# Patient Record
Sex: Female | Born: 1940 | Race: White | Hispanic: No | Marital: Married | Smoking: Former smoker
Health system: Southern US, Community
[De-identification: ages and names within clinical notes are randomized; demographics above are authoritative.]

## PROBLEM LIST (undated history)

## (undated) DIAGNOSIS — F419 Anxiety disorder, unspecified: Secondary | ICD-10-CM

## (undated) DIAGNOSIS — I10 Essential (primary) hypertension: Secondary | ICD-10-CM

## (undated) DIAGNOSIS — M719 Bursopathy, unspecified: Secondary | ICD-10-CM

## (undated) DIAGNOSIS — M1990 Unspecified osteoarthritis, unspecified site: Secondary | ICD-10-CM

## (undated) DIAGNOSIS — K429 Umbilical hernia without obstruction or gangrene: Secondary | ICD-10-CM

## (undated) DIAGNOSIS — K219 Gastro-esophageal reflux disease without esophagitis: Secondary | ICD-10-CM

## (undated) DIAGNOSIS — N200 Calculus of kidney: Secondary | ICD-10-CM

## (undated) DIAGNOSIS — T7840XA Allergy, unspecified, initial encounter: Secondary | ICD-10-CM

## (undated) DIAGNOSIS — R011 Cardiac murmur, unspecified: Secondary | ICD-10-CM

## (undated) DIAGNOSIS — Z87442 Personal history of urinary calculi: Secondary | ICD-10-CM

## (undated) HISTORY — PX: SMALL INTESTINE SURGERY: SHX150

## (undated) HISTORY — PX: TUBAL LIGATION: SHX77

## (undated) HISTORY — DX: Cardiac murmur, unspecified: R01.1

## (undated) HISTORY — DX: Gastro-esophageal reflux disease without esophagitis: K21.9

## (undated) HISTORY — DX: Anxiety disorder, unspecified: F41.9

## (undated) HISTORY — DX: Bursopathy, unspecified: M71.9

## (undated) HISTORY — DX: Umbilical hernia without obstruction or gangrene: K42.9

## (undated) HISTORY — PX: JOINT REPLACEMENT: SHX530

## (undated) HISTORY — DX: Unspecified osteoarthritis, unspecified site: M19.90

## (undated) HISTORY — DX: Allergy, unspecified, initial encounter: T78.40XA

## (undated) HISTORY — PX: SHOULDER ARTHROSCOPY WITH CAPSULORRHAPHY: SHX6454

---

## 2011-07-09 DIAGNOSIS — H40019 Open angle with borderline findings, low risk, unspecified eye: Secondary | ICD-10-CM | POA: Diagnosis not present

## 2011-07-09 DIAGNOSIS — H35369 Drusen (degenerative) of macula, unspecified eye: Secondary | ICD-10-CM | POA: Diagnosis not present

## 2011-07-09 DIAGNOSIS — H259 Unspecified age-related cataract: Secondary | ICD-10-CM | POA: Diagnosis not present

## 2011-07-21 DIAGNOSIS — M19079 Primary osteoarthritis, unspecified ankle and foot: Secondary | ICD-10-CM | POA: Diagnosis not present

## 2011-07-27 DIAGNOSIS — E785 Hyperlipidemia, unspecified: Secondary | ICD-10-CM | POA: Diagnosis not present

## 2011-07-27 DIAGNOSIS — Z79899 Other long term (current) drug therapy: Secondary | ICD-10-CM | POA: Diagnosis not present

## 2011-07-27 DIAGNOSIS — I359 Nonrheumatic aortic valve disorder, unspecified: Secondary | ICD-10-CM | POA: Diagnosis not present

## 2011-07-27 DIAGNOSIS — R072 Precordial pain: Secondary | ICD-10-CM | POA: Diagnosis not present

## 2011-07-27 DIAGNOSIS — N644 Mastodynia: Secondary | ICD-10-CM | POA: Diagnosis not present

## 2011-07-27 DIAGNOSIS — M25579 Pain in unspecified ankle and joints of unspecified foot: Secondary | ICD-10-CM | POA: Diagnosis not present

## 2011-07-27 DIAGNOSIS — I10 Essential (primary) hypertension: Secondary | ICD-10-CM | POA: Diagnosis not present

## 2011-07-29 DIAGNOSIS — R5383 Other fatigue: Secondary | ICD-10-CM | POA: Diagnosis not present

## 2011-07-29 DIAGNOSIS — Z79899 Other long term (current) drug therapy: Secondary | ICD-10-CM | POA: Diagnosis not present

## 2011-07-29 DIAGNOSIS — E785 Hyperlipidemia, unspecified: Secondary | ICD-10-CM | POA: Diagnosis not present

## 2011-07-29 DIAGNOSIS — D539 Nutritional anemia, unspecified: Secondary | ICD-10-CM | POA: Diagnosis not present

## 2011-07-29 DIAGNOSIS — R7989 Other specified abnormal findings of blood chemistry: Secondary | ICD-10-CM | POA: Diagnosis not present

## 2011-08-11 DIAGNOSIS — R072 Precordial pain: Secondary | ICD-10-CM | POA: Diagnosis not present

## 2011-08-11 DIAGNOSIS — R0989 Other specified symptoms and signs involving the circulatory and respiratory systems: Secondary | ICD-10-CM | POA: Diagnosis not present

## 2011-08-11 DIAGNOSIS — I10 Essential (primary) hypertension: Secondary | ICD-10-CM | POA: Diagnosis not present

## 2011-09-21 DIAGNOSIS — M19019 Primary osteoarthritis, unspecified shoulder: Secondary | ICD-10-CM | POA: Diagnosis not present

## 2011-09-21 DIAGNOSIS — M25519 Pain in unspecified shoulder: Secondary | ICD-10-CM | POA: Diagnosis not present

## 2011-09-21 DIAGNOSIS — M75120 Complete rotator cuff tear or rupture of unspecified shoulder, not specified as traumatic: Secondary | ICD-10-CM | POA: Diagnosis not present

## 2011-12-31 DIAGNOSIS — I10 Essential (primary) hypertension: Secondary | ICD-10-CM | POA: Diagnosis not present

## 2011-12-31 DIAGNOSIS — N951 Menopausal and female climacteric states: Secondary | ICD-10-CM | POA: Diagnosis not present

## 2012-01-14 DIAGNOSIS — I10 Essential (primary) hypertension: Secondary | ICD-10-CM | POA: Diagnosis not present

## 2012-02-09 ENCOUNTER — Other Ambulatory Visit (HOSPITAL_COMMUNITY): Payer: Self-pay | Admitting: Family

## 2012-02-09 DIAGNOSIS — Z1231 Encounter for screening mammogram for malignant neoplasm of breast: Secondary | ICD-10-CM

## 2012-02-18 ENCOUNTER — Ambulatory Visit (HOSPITAL_COMMUNITY)
Admission: RE | Admit: 2012-02-18 | Discharge: 2012-02-18 | Disposition: A | Discharge status: Home / Self Care | Payer: Medicare Other | Source: Ambulatory Visit | Attending: Family Medicine | Admitting: Family Medicine

## 2012-02-18 DIAGNOSIS — Z1231 Encounter for screening mammogram for malignant neoplasm of breast: Secondary | ICD-10-CM | POA: Diagnosis not present

## 2012-03-02 DIAGNOSIS — M25519 Pain in unspecified shoulder: Secondary | ICD-10-CM | POA: Diagnosis not present

## 2012-06-27 DIAGNOSIS — H2510 Age-related nuclear cataract, unspecified eye: Secondary | ICD-10-CM | POA: Diagnosis not present

## 2012-06-27 DIAGNOSIS — H18419 Arcus senilis, unspecified eye: Secondary | ICD-10-CM | POA: Diagnosis not present

## 2012-07-15 DIAGNOSIS — N951 Menopausal and female climacteric states: Secondary | ICD-10-CM | POA: Diagnosis not present

## 2012-07-15 DIAGNOSIS — I10 Essential (primary) hypertension: Secondary | ICD-10-CM | POA: Diagnosis not present

## 2012-07-27 DIAGNOSIS — H2510 Age-related nuclear cataract, unspecified eye: Secondary | ICD-10-CM | POA: Diagnosis not present

## 2012-07-27 DIAGNOSIS — H269 Unspecified cataract: Secondary | ICD-10-CM | POA: Diagnosis not present

## 2012-08-01 DIAGNOSIS — H269 Unspecified cataract: Secondary | ICD-10-CM | POA: Diagnosis not present

## 2012-08-01 DIAGNOSIS — H2510 Age-related nuclear cataract, unspecified eye: Secondary | ICD-10-CM | POA: Diagnosis not present

## 2012-08-12 DIAGNOSIS — N951 Menopausal and female climacteric states: Secondary | ICD-10-CM | POA: Diagnosis not present

## 2012-08-12 DIAGNOSIS — I10 Essential (primary) hypertension: Secondary | ICD-10-CM | POA: Diagnosis not present

## 2012-11-04 DIAGNOSIS — T148 Other injury of unspecified body region: Secondary | ICD-10-CM | POA: Diagnosis not present

## 2012-11-28 DIAGNOSIS — Z96619 Presence of unspecified artificial shoulder joint: Secondary | ICD-10-CM | POA: Diagnosis not present

## 2012-12-07 DIAGNOSIS — H26499 Other secondary cataract, unspecified eye: Secondary | ICD-10-CM | POA: Diagnosis not present

## 2012-12-07 DIAGNOSIS — Z961 Presence of intraocular lens: Secondary | ICD-10-CM | POA: Diagnosis not present

## 2013-02-06 ENCOUNTER — Other Ambulatory Visit (HOSPITAL_COMMUNITY): Payer: Self-pay | Admitting: Family

## 2013-02-06 DIAGNOSIS — Z1231 Encounter for screening mammogram for malignant neoplasm of breast: Secondary | ICD-10-CM

## 2013-02-10 DIAGNOSIS — I10 Essential (primary) hypertension: Secondary | ICD-10-CM | POA: Diagnosis not present

## 2013-02-10 DIAGNOSIS — M79609 Pain in unspecified limb: Secondary | ICD-10-CM | POA: Diagnosis not present

## 2013-02-10 DIAGNOSIS — L989 Disorder of the skin and subcutaneous tissue, unspecified: Secondary | ICD-10-CM | POA: Diagnosis not present

## 2013-02-10 DIAGNOSIS — R0789 Other chest pain: Secondary | ICD-10-CM | POA: Diagnosis not present

## 2013-02-16 DIAGNOSIS — M79609 Pain in unspecified limb: Secondary | ICD-10-CM | POA: Diagnosis not present

## 2013-02-16 DIAGNOSIS — M2010 Hallux valgus (acquired), unspecified foot: Secondary | ICD-10-CM | POA: Diagnosis not present

## 2013-02-16 DIAGNOSIS — Q6689 Other  specified congenital deformities of feet: Secondary | ICD-10-CM | POA: Diagnosis not present

## 2013-02-16 DIAGNOSIS — Q742 Other congenital malformations of lower limb(s), including pelvic girdle: Secondary | ICD-10-CM | POA: Diagnosis not present

## 2013-02-20 ENCOUNTER — Ambulatory Visit (HOSPITAL_COMMUNITY)
Admission: RE | Admit: 2013-02-20 | Discharge: 2013-02-20 | Disposition: A | Discharge status: Home / Self Care | Payer: Medicare Other | Source: Ambulatory Visit | Attending: Family Medicine | Admitting: Family Medicine

## 2013-02-20 DIAGNOSIS — Z1231 Encounter for screening mammogram for malignant neoplasm of breast: Secondary | ICD-10-CM | POA: Diagnosis not present

## 2013-02-23 DIAGNOSIS — L821 Other seborrheic keratosis: Secondary | ICD-10-CM | POA: Diagnosis not present

## 2013-02-23 DIAGNOSIS — L820 Inflamed seborrheic keratosis: Secondary | ICD-10-CM | POA: Diagnosis not present

## 2013-02-23 DIAGNOSIS — D485 Neoplasm of uncertain behavior of skin: Secondary | ICD-10-CM | POA: Diagnosis not present

## 2013-06-14 DIAGNOSIS — H26499 Other secondary cataract, unspecified eye: Secondary | ICD-10-CM | POA: Diagnosis not present

## 2013-07-09 ENCOUNTER — Encounter (HOSPITAL_COMMUNITY): Payer: Medicare Other | Admitting: Certified Registered Nurse Anesthetist

## 2013-07-09 ENCOUNTER — Encounter (HOSPITAL_COMMUNITY): Payer: Self-pay | Admitting: Emergency Medicine

## 2013-07-09 ENCOUNTER — Encounter (HOSPITAL_COMMUNITY): Admission: EM | Disposition: A | Discharge status: Home / Self Care | Payer: Self-pay | Source: Home / Self Care

## 2013-07-09 ENCOUNTER — Emergency Department (HOSPITAL_COMMUNITY): Payer: Medicare Other

## 2013-07-09 ENCOUNTER — Emergency Department (HOSPITAL_COMMUNITY): Payer: Medicare Other | Admitting: Certified Registered Nurse Anesthetist

## 2013-07-09 ENCOUNTER — Inpatient Hospital Stay (HOSPITAL_COMMUNITY)
Admission: EM | Admit: 2013-07-09 | Discharge: 2013-07-16 | DRG: 330 | Disposition: A | Discharge status: Home / Self Care | Payer: Medicare Other | Attending: Surgery | Admitting: Surgery

## 2013-07-09 DIAGNOSIS — D259 Leiomyoma of uterus, unspecified: Secondary | ICD-10-CM | POA: Diagnosis not present

## 2013-07-09 DIAGNOSIS — R142 Eructation: Secondary | ICD-10-CM | POA: Diagnosis not present

## 2013-07-09 DIAGNOSIS — J9819 Other pulmonary collapse: Secondary | ICD-10-CM | POA: Diagnosis not present

## 2013-07-09 DIAGNOSIS — J90 Pleural effusion, not elsewhere classified: Secondary | ICD-10-CM | POA: Diagnosis not present

## 2013-07-09 DIAGNOSIS — I10 Essential (primary) hypertension: Secondary | ICD-10-CM | POA: Diagnosis present

## 2013-07-09 DIAGNOSIS — N281 Cyst of kidney, acquired: Secondary | ICD-10-CM | POA: Diagnosis not present

## 2013-07-09 DIAGNOSIS — K630 Abscess of intestine: Secondary | ICD-10-CM | POA: Diagnosis present

## 2013-07-09 DIAGNOSIS — R198 Other specified symptoms and signs involving the digestive system and abdomen: Secondary | ICD-10-CM | POA: Diagnosis present

## 2013-07-09 DIAGNOSIS — N200 Calculus of kidney: Secondary | ICD-10-CM | POA: Diagnosis not present

## 2013-07-09 DIAGNOSIS — Z4682 Encounter for fitting and adjustment of non-vascular catheter: Secondary | ICD-10-CM | POA: Diagnosis not present

## 2013-07-09 DIAGNOSIS — R109 Unspecified abdominal pain: Secondary | ICD-10-CM | POA: Diagnosis present

## 2013-07-09 DIAGNOSIS — K651 Peritoneal abscess: Secondary | ICD-10-CM | POA: Diagnosis not present

## 2013-07-09 DIAGNOSIS — Z87442 Personal history of urinary calculi: Secondary | ICD-10-CM

## 2013-07-09 DIAGNOSIS — K631 Perforation of intestine (nontraumatic): Secondary | ICD-10-CM | POA: Diagnosis not present

## 2013-07-09 DIAGNOSIS — Z87891 Personal history of nicotine dependence: Secondary | ICD-10-CM

## 2013-07-09 DIAGNOSIS — K5712 Diverticulitis of small intestine without perforation or abscess without bleeding: Secondary | ICD-10-CM | POA: Diagnosis present

## 2013-07-09 DIAGNOSIS — K5710 Diverticulosis of small intestine without perforation or abscess without bleeding: Principal | ICD-10-CM | POA: Diagnosis present

## 2013-07-09 DIAGNOSIS — R141 Gas pain: Secondary | ICD-10-CM | POA: Diagnosis not present

## 2013-07-09 DIAGNOSIS — R1032 Left lower quadrant pain: Secondary | ICD-10-CM | POA: Diagnosis not present

## 2013-07-09 DIAGNOSIS — R69 Illness, unspecified: Secondary | ICD-10-CM | POA: Diagnosis not present

## 2013-07-09 DIAGNOSIS — Z881 Allergy status to other antibiotic agents status: Secondary | ICD-10-CM

## 2013-07-09 DIAGNOSIS — K5289 Other specified noninfective gastroenteritis and colitis: Secondary | ICD-10-CM | POA: Diagnosis not present

## 2013-07-09 HISTORY — PX: LAPAROTOMY: SHX154

## 2013-07-09 HISTORY — DX: Calculus of kidney: N20.0

## 2013-07-09 HISTORY — PX: BOWEL RESECTION: SHX1257

## 2013-07-09 LAB — URINALYSIS, ROUTINE W REFLEX MICROSCOPIC
Bilirubin Urine: NEGATIVE
GLUCOSE, UA: NEGATIVE mg/dL
HGB URINE DIPSTICK: NEGATIVE
Ketones, ur: 40 mg/dL — AB
LEUKOCYTES UA: NEGATIVE
Nitrite: NEGATIVE
Protein, ur: NEGATIVE mg/dL
SPECIFIC GRAVITY, URINE: 1.025 (ref 1.005–1.030)
UROBILINOGEN UA: 0.2 mg/dL (ref 0.0–1.0)
pH: 5.5 (ref 5.0–8.0)

## 2013-07-09 LAB — CBC WITH DIFFERENTIAL/PLATELET
Basophils Absolute: 0 10*3/uL (ref 0.0–0.1)
Basophils Relative: 0 % (ref 0–1)
EOS PCT: 0 % (ref 0–5)
Eosinophils Absolute: 0 10*3/uL (ref 0.0–0.7)
HEMATOCRIT: 48.2 % — AB (ref 36.0–46.0)
HEMOGLOBIN: 16.8 g/dL — AB (ref 12.0–15.0)
LYMPHS ABS: 0.5 10*3/uL — AB (ref 0.7–4.0)
LYMPHS PCT: 17 % (ref 12–46)
MCH: 32.7 pg (ref 26.0–34.0)
MCHC: 34.9 g/dL (ref 30.0–36.0)
MCV: 93.8 fL (ref 78.0–100.0)
MONO ABS: 0.2 10*3/uL (ref 0.1–1.0)
MONOS PCT: 7 % (ref 3–12)
NEUTROS ABS: 2.2 10*3/uL (ref 1.7–7.7)
Neutrophils Relative %: 76 % (ref 43–77)
Platelets: 132 10*3/uL — ABNORMAL LOW (ref 150–400)
RBC: 5.14 MIL/uL — AB (ref 3.87–5.11)
RDW: 12.3 % (ref 11.5–15.5)
WBC: 2.9 10*3/uL — AB (ref 4.0–10.5)

## 2013-07-09 LAB — I-STAT CHEM 8, ED
BUN: 21 mg/dL (ref 6–23)
CREATININE: 0.8 mg/dL (ref 0.50–1.10)
Calcium, Ion: 0.99 mmol/L — ABNORMAL LOW (ref 1.13–1.30)
Chloride: 99 mEq/L (ref 96–112)
Glucose, Bld: 136 mg/dL — ABNORMAL HIGH (ref 70–99)
HCT: 48 % — ABNORMAL HIGH (ref 36.0–46.0)
HEMOGLOBIN: 16.3 g/dL — AB (ref 12.0–15.0)
POTASSIUM: 3 meq/L — AB (ref 3.7–5.3)
SODIUM: 138 meq/L (ref 137–147)
TCO2: 25 mmol/L (ref 0–100)

## 2013-07-09 LAB — CREATININE, SERUM
CREATININE: 0.68 mg/dL (ref 0.50–1.10)
GFR calc Af Amer: >90 mL/min (ref >90)
GFR calc non Af Amer: 84 mL/min — ABNORMAL LOW (ref >90)

## 2013-07-09 SURGERY — LAPAROTOMY, EXPLORATORY
Anesthesia: General

## 2013-07-09 MED ORDER — PHENYLEPHRINE 40 MCG/ML (10ML) SYRINGE FOR IV PUSH (FOR BLOOD PRESSURE SUPPORT)
PREFILLED_SYRINGE | INTRAVENOUS | Status: AC
  Filled 2013-07-09: qty 10

## 2013-07-09 MED ORDER — DEXAMETHASONE SODIUM PHOSPHATE 10 MG/ML IJ SOLN
INTRAMUSCULAR | Status: AC
  Filled 2013-07-09: qty 1

## 2013-07-09 MED ORDER — HYDROMORPHONE 0.3 MG/ML IV SOLN
INTRAVENOUS | Status: AC
  Filled 2013-07-09: qty 25

## 2013-07-09 MED ORDER — SODIUM CHLORIDE 0.9 % IJ SOLN: 9.0000 mL | INTRAMUSCULAR | Status: DC | PRN

## 2013-07-09 MED ORDER — MIDAZOLAM HCL 2 MG/2ML IJ SOLN
INTRAMUSCULAR | Status: AC
  Filled 2013-07-09: qty 2

## 2013-07-09 MED ORDER — ONDANSETRON HCL 4 MG/2ML IJ SOLN
INTRAMUSCULAR | Status: DC | PRN
  Administered 2013-07-09: 4 mg via INTRAVENOUS

## 2013-07-09 MED ORDER — ONDANSETRON HCL 4 MG/2ML IJ SOLN
4.0000 mg | Freq: Once | INTRAMUSCULAR | Status: AC
  Administered 2013-07-09: 4 mg via INTRAVENOUS
  Filled 2013-07-09: qty 2

## 2013-07-09 MED ORDER — MORPHINE SULFATE 4 MG/ML IJ SOLN
4.0000 mg | Freq: Once | INTRAMUSCULAR | Status: AC
Start: 2013-07-09 — End: 2013-07-09
  Administered 2013-07-09: 4 mg via INTRAVENOUS
  Filled 2013-07-09: qty 1

## 2013-07-09 MED ORDER — HYDROMORPHONE HCL PF 1 MG/ML IJ SOLN
INTRAMUSCULAR | Status: AC
  Filled 2013-07-09: qty 1

## 2013-07-09 MED ORDER — DIPHENHYDRAMINE HCL 12.5 MG/5ML PO ELIX: 12.5000 mg | ORAL_SOLUTION | Freq: Four times a day (QID) | ORAL | Status: DC | PRN

## 2013-07-09 MED ORDER — ONDANSETRON HCL 4 MG/2ML IJ SOLN
INTRAMUSCULAR | Status: AC
  Filled 2013-07-09: qty 2

## 2013-07-09 MED ORDER — PHENYLEPHRINE HCL 10 MG/ML IJ SOLN
INTRAMUSCULAR | Status: DC | PRN
  Administered 2013-07-09: 80 ug via INTRAVENOUS

## 2013-07-09 MED ORDER — ONDANSETRON HCL 4 MG/2ML IJ SOLN: 4.0000 mg | Freq: Four times a day (QID) | INTRAMUSCULAR | Status: DC | PRN

## 2013-07-09 MED ORDER — FENTANYL CITRATE 0.05 MG/ML IJ SOLN
INTRAMUSCULAR | Status: AC
  Filled 2013-07-09: qty 5

## 2013-07-09 MED ORDER — PROPOFOL 10 MG/ML IV BOLUS
INTRAVENOUS | Status: AC
  Filled 2013-07-09: qty 20

## 2013-07-09 MED ORDER — DEXAMETHASONE SODIUM PHOSPHATE 10 MG/ML IJ SOLN
INTRAMUSCULAR | Status: DC | PRN
  Administered 2013-07-09: 10 mg via INTRAVENOUS

## 2013-07-09 MED ORDER — SODIUM CHLORIDE 0.9 % IV BOLUS (SEPSIS)
1000.0000 mL | Freq: Once | INTRAVENOUS | Status: AC
  Administered 2013-07-09: 1000 mL via INTRAVENOUS

## 2013-07-09 MED ORDER — PIPERACILLIN-TAZOBACTAM 3.375 G IVPB 30 MIN
3.3750 g | Freq: Once | INTRAVENOUS | Status: AC
  Administered 2013-07-09: 3.375 g via INTRAVENOUS
  Filled 2013-07-09: qty 50

## 2013-07-09 MED ORDER — GLYCOPYRROLATE 0.2 MG/ML IJ SOLN
INTRAMUSCULAR | Status: AC
  Filled 2013-07-09: qty 3

## 2013-07-09 MED ORDER — ACETAMINOPHEN 325 MG PO TABS
650.0000 mg | ORAL_TABLET | ORAL | Status: DC | PRN
  Administered 2013-07-12: 650 mg via ORAL
  Filled 2013-07-09: qty 2

## 2013-07-09 MED ORDER — MIDAZOLAM HCL 5 MG/5ML IJ SOLN
INTRAMUSCULAR | Status: DC | PRN
  Administered 2013-07-09: 2 mg via INTRAVENOUS

## 2013-07-09 MED ORDER — DIPHENHYDRAMINE HCL 50 MG/ML IJ SOLN: 12.5000 mg | Freq: Four times a day (QID) | INTRAMUSCULAR | Status: DC | PRN

## 2013-07-09 MED ORDER — GLYCOPYRROLATE 0.2 MG/ML IJ SOLN
INTRAMUSCULAR | Status: DC | PRN
  Administered 2013-07-09: 0.6 mg via INTRAVENOUS

## 2013-07-09 MED ORDER — ROCURONIUM BROMIDE 100 MG/10ML IV SOLN
INTRAVENOUS | Status: AC
  Filled 2013-07-09: qty 1

## 2013-07-09 MED ORDER — ONDANSETRON HCL 4 MG PO TABS: 4.0000 mg | ORAL_TABLET | Freq: Four times a day (QID) | ORAL | Status: DC | PRN

## 2013-07-09 MED ORDER — KCL IN DEXTROSE-NACL 20-5-0.45 MEQ/L-%-% IV SOLN
INTRAVENOUS | Status: DC
  Administered 2013-07-09: 22:00:00 via INTRAVENOUS
  Filled 2013-07-09 (×2): qty 1000

## 2013-07-09 MED ORDER — PROPOFOL 10 MG/ML IV BOLUS
INTRAVENOUS | Status: DC | PRN
  Administered 2013-07-09: 150 mg via INTRAVENOUS

## 2013-07-09 MED ORDER — NALOXONE HCL 0.4 MG/ML IJ SOLN: 0.4000 mg | INTRAMUSCULAR | Status: DC | PRN

## 2013-07-09 MED ORDER — FENTANYL CITRATE 0.05 MG/ML IJ SOLN
INTRAMUSCULAR | Status: DC | PRN
Start: 2013-07-09 — End: 2013-07-09
  Administered 2013-07-09: 50 ug via INTRAVENOUS
  Administered 2013-07-09: 100 ug via INTRAVENOUS
  Administered 2013-07-09 (×2): 50 ug via INTRAVENOUS

## 2013-07-09 MED ORDER — SUCCINYLCHOLINE CHLORIDE 20 MG/ML IJ SOLN
INTRAMUSCULAR | Status: DC | PRN
  Administered 2013-07-09: 100 mg via INTRAVENOUS

## 2013-07-09 MED ORDER — SODIUM CHLORIDE 0.9 % IR SOLN
Status: DC | PRN
  Administered 2013-07-09 (×3): 1000 mL

## 2013-07-09 MED ORDER — PROMETHAZINE HCL 25 MG/ML IJ SOLN
6.2500 mg | INTRAMUSCULAR | Status: DC | PRN
Start: 2013-07-09 — End: 2013-07-09

## 2013-07-09 MED ORDER — LIDOCAINE HCL (CARDIAC) 20 MG/ML IV SOLN
INTRAVENOUS | Status: AC
  Filled 2013-07-09: qty 5

## 2013-07-09 MED ORDER — HYDROMORPHONE HCL PF 1 MG/ML IJ SOLN
0.2500 mg | INTRAMUSCULAR | Status: DC | PRN
  Administered 2013-07-09: 0.25 mg via INTRAVENOUS

## 2013-07-09 MED ORDER — HYDROMORPHONE 0.3 MG/ML IV SOLN
INTRAVENOUS | Status: DC
  Administered 2013-07-09: 22:00:00 via INTRAVENOUS
  Administered 2013-07-10: 4 mg via INTRAVENOUS
  Administered 2013-07-10: 0.4 mg via INTRAVENOUS
  Administered 2013-07-10: 0.2 mg via INTRAVENOUS
  Administered 2013-07-10: 0.5 mL via INTRAVENOUS
  Administered 2013-07-10: 0.1999 mg via INTRAVENOUS
  Administered 2013-07-11: 0.188 mL via INTRAVENOUS
  Administered 2013-07-11: 0.1 mL via INTRAVENOUS
  Administered 2013-07-11: 0.599 mg via INTRAVENOUS
  Administered 2013-07-11: 0.8 mg via INTRAVENOUS
  Administered 2013-07-11: 0.199 mg via INTRAVENOUS
  Administered 2013-07-12: 3.99 mg via INTRAVENOUS
  Administered 2013-07-12: 0.6 mg via INTRAVENOUS
  Administered 2013-07-13: 0.599 mg via INTRAVENOUS
  Administered 2013-07-13: 0.399 mg via INTRAVENOUS
  Administered 2013-07-13: 3.99 mg via INTRAVENOUS
  Administered 2013-07-13 – 2013-07-14 (×3): 0.2 mg via INTRAVENOUS
  Filled 2013-07-09: qty 25

## 2013-07-09 MED ORDER — ENOXAPARIN SODIUM 40 MG/0.4ML ~~LOC~~ SOLN
40.0000 mg | SUBCUTANEOUS | Status: DC
  Administered 2013-07-10 – 2013-07-15 (×6): 40 mg via SUBCUTANEOUS
  Filled 2013-07-09 (×7): qty 0.4

## 2013-07-09 MED ORDER — NEOSTIGMINE METHYLSULFATE 1 MG/ML IJ SOLN
INTRAMUSCULAR | Status: AC
  Filled 2013-07-09: qty 10

## 2013-07-09 MED ORDER — NEOSTIGMINE METHYLSULFATE 1 MG/ML IJ SOLN
INTRAMUSCULAR | Status: DC | PRN
  Administered 2013-07-09: 5 mg via INTRAVENOUS

## 2013-07-09 MED ORDER — LACTATED RINGERS IV SOLN
INTRAVENOUS | Status: DC | PRN
  Administered 2013-07-09 (×2): via INTRAVENOUS

## 2013-07-09 MED ORDER — ROCURONIUM BROMIDE 100 MG/10ML IV SOLN
INTRAVENOUS | Status: DC | PRN
  Administered 2013-07-09: 30 mg via INTRAVENOUS

## 2013-07-09 MED ORDER — LIDOCAINE HCL (CARDIAC) 20 MG/ML IV SOLN
INTRAVENOUS | Status: DC | PRN
  Administered 2013-07-09: 100 mg via INTRAVENOUS

## 2013-07-09 SURGICAL SUPPLY — 41 items
APPLICATOR COTTON TIP 6IN STRL (MISCELLANEOUS) ×4 IMPLANT
BLADE EXTENDED COATED 6.5IN (ELECTRODE) ×4 IMPLANT
BLADE HEX COATED 2.75 (ELECTRODE) ×4 IMPLANT
CANISTER SUCTION 2500CC (MISCELLANEOUS) ×4 IMPLANT
CONT SPECI 4OZ STER CLIK (MISCELLANEOUS) ×4 IMPLANT
COVER MAYO STAND STRL (DRAPES) ×4 IMPLANT
DRAPE LAPAROSCOPIC ABDOMINAL (DRAPES) ×4 IMPLANT
DRAPE WARM FLUID 44X44 (DRAPE) ×4 IMPLANT
DRSG OPSITE POSTOP 4X8 (GAUZE/BANDAGES/DRESSINGS) ×4 IMPLANT
ELECT REM PT RETURN 9FT ADLT (ELECTROSURGICAL) ×4
ELECTRODE REM PT RTRN 9FT ADLT (ELECTROSURGICAL) ×2 IMPLANT
GLOVE BIOGEL PI IND STRL 7.0 (GLOVE) ×2 IMPLANT
GLOVE BIOGEL PI INDICATOR 7.0 (GLOVE) ×2
GLOVE SURG ORTHO 8.0 STRL STRW (GLOVE) ×4 IMPLANT
GOWN STRL REUS W/TWL LRG LVL3 (GOWN DISPOSABLE) ×4 IMPLANT
GOWN STRL REUS W/TWL XL LVL3 (GOWN DISPOSABLE) ×8 IMPLANT
KIT BASIN OR (CUSTOM PROCEDURE TRAY) ×4 IMPLANT
NS IRRIG 1000ML POUR BTL (IV SOLUTION) ×4 IMPLANT
PACK GENERAL/GYN (CUSTOM PROCEDURE TRAY) ×4 IMPLANT
RELOAD PROXIMATE 75MM BLUE (ENDOMECHANICALS) ×8 IMPLANT
SPONGE GAUZE 4X4 12PLY (GAUZE/BANDAGES/DRESSINGS) ×4 IMPLANT
SPONGE LAP 18X18 X RAY DECT (DISPOSABLE) IMPLANT
STAPLER GUN LINEAR PROX 60 (STAPLE) ×4 IMPLANT
STAPLER PROXIMATE 75MM BLUE (STAPLE) ×4 IMPLANT
STAPLER VISISTAT 35W (STAPLE) ×4 IMPLANT
SUCTION POOLE TIP (SUCTIONS) ×4 IMPLANT
SUT NOV 1 T60/GS (SUTURE) IMPLANT
SUT NOVA NAB GS-21 0 18 T12 DT (SUTURE) ×16 IMPLANT
SUT SILK 2 0 (SUTURE)
SUT SILK 2 0 SH CR/8 (SUTURE) ×4 IMPLANT
SUT SILK 2-0 18XBRD TIE 12 (SUTURE) IMPLANT
SUT SILK 3 0 (SUTURE)
SUT SILK 3 0 SH CR/8 (SUTURE) IMPLANT
SUT SILK 3-0 18XBRD TIE 12 (SUTURE) IMPLANT
SUT VICRYL 2 0 18  UND BR (SUTURE) ×2
SUT VICRYL 2 0 18 UND BR (SUTURE) ×2 IMPLANT
TOWEL OR 17X26 10 PK STRL BLUE (TOWEL DISPOSABLE) ×8 IMPLANT
TRAY FOLEY CATH 14FRSI W/METER (CATHETERS) ×4 IMPLANT
TUBE ANAEROBIC SPECIMEN COL (MISCELLANEOUS) ×4 IMPLANT
WATER STERILE IRR 1500ML POUR (IV SOLUTION) IMPLANT
YANKAUER SUCT BULB TIP NO VENT (SUCTIONS) ×4 IMPLANT

## 2013-07-09 NOTE — Preoperative (Signed)
Beta Blockers   Reason not to administer Beta Blockers:Not Applicable 

## 2013-07-09 NOTE — ED Notes (Signed)
Per EMS-pt c/o of lower abd cramping and L flank pain 10/10 that started this morning. Hx of kidney stones. Denies chest pain, sob.

## 2013-07-09 NOTE — Brief Op Note (Signed)
07/09/2013  8:59 PM  PATIENT:  Ellen [name]  73 y.o. female  PRE-OPERATIVE DIAGNOSIS:  perforated viscus  POST-OPERATIVE DIAGNOSIS:  perforated proximal jejunum  PROCEDURE:  Procedure(s): EXPLORATORY LAPAROTOMY (N/A) SMALL BOWEL RESECTION  SURGEON:  Surgeon(s) and Role:    * Earnstine Regal, MD - Primary  ANESTHESIA:   general  EBL:  Total I/O In: 1000 [I.V.:1000] Out: 150 [Urine:100; Blood:50]  BLOOD ADMINISTERED:none  DRAINS: none   LOCAL MEDICATIONS USED:  NONE  SPECIMEN:  Excision  DISPOSITION OF SPECIMEN:  PATHOLOGY  COUNTS:  YES  TOURNIQUET:  * No tourniquets in log *  DICTATION: .Other Dictation: Dictation Number [id number]  PLAN OF CARE: Admit to inpatient   PATIENT DISPOSITION:  PACU - hemodynamically stable.   Delay start of Pharmacological VTE agent (>24hrs) due to surgical blood loss or risk of bleeding: yes  Earnstine Regal, MD, [facility] Office: [phone]

## 2013-07-09 NOTE — H&P (Signed)
Ellen [name] is an 73 y.o. female.    General Surgery [facility]  Chief Complaint: abdominal pain, perforated viscus  HPI: patient is a 73 year old female who presents with one-day history of abdominal pain localizing to the left lower quadrant of the abdomen. Patient has had a severe cough for one week. She had fever 3 days ago. She began having diarrhea. She developed pain in the left lower quadrant of the abdomen. She presented to the emergency department for evaluation. Laboratory studies show a low white blood cell count of 2.9. CT scan abdomen and pelvis shows findings consistent with perforated viscus, probably from a loop of small intestine in the left lower quadrant of the abdomen, with a small abscess.  Patient has no prior history of abdominal surgery. She has no history of inflammatory bowel disease. She has no history of diverticular disease. She has no history of peptic ulcer disease.  Past Medical History  Diagnosis Date  . Kidney stones     History reviewed. No pertinent past surgical history.  No family history on file. Social History:  reports that she has quit smoking. She does not have any smokeless tobacco history on file. Her alcohol and drug histories are not on file.  Allergies:  Allergies  Allergen Reactions  . Sulfa Antibiotics Nausea And Vomiting and Swelling     (Not in a hospital admission)  Results for orders placed during the hospital encounter of 07/09/13 (from the past 48 hour(s))  CBC WITH DIFFERENTIAL     Status: Abnormal   Collection Time    07/09/13  4:13 PM      Result Value Ref Range   WBC 2.9 (*) 4.0 - 10.5 K/uL   RBC 5.14 (*) 3.87 - 5.11 MIL/uL   Hemoglobin 16.8 (*) 12.0 - 15.0 g/dL   HCT 48.2 (*) 36.0 - 46.0 %   MCV 93.8  78.0 - 100.0 fL   MCH 32.7  26.0 - 34.0 pg   MCHC 34.9  30.0 - 36.0 g/dL   RDW 12.3  11.5 - 15.5 %   Platelets 132 (*) 150 - 400 K/uL   Neutrophils Relative % 76  43 - 77 %   Neutro Abs 2.2   1.7 - 7.7 K/uL   Lymphocytes Relative 17  12 - 46 %   Lymphs Abs 0.5 (*) 0.7 - 4.0 K/uL   Monocytes Relative 7  3 - 12 %   Monocytes Absolute 0.2  0.1 - 1.0 K/uL   Eosinophils Relative 0  0 - 5 %   Eosinophils Absolute 0.0  0.0 - 0.7 K/uL   Basophils Relative 0  0 - 1 %   Basophils Absolute 0.0  0.0 - 0.1 K/uL  I-STAT CHEM 8, ED     Status: Abnormal   Collection Time    07/09/13  4:24 PM      Result Value Ref Range   Sodium 138  137 - 147 mEq/L   Potassium 3.0 (*) 3.7 - 5.3 mEq/L   Chloride 99  96 - 112 mEq/L   BUN 21  6 - 23 mg/dL   Creatinine, Ser 0.80  0.50 - 1.10 mg/dL   Glucose, Bld 136 (*) 70 - 99 mg/dL   Calcium, Ion 0.99 (*) 1.13 - 1.30 mmol/L   TCO2 25  0 - 100 mmol/L   Hemoglobin 16.3 (*) 12.0 - 15.0 g/dL   HCT 48.0 (*) 36.0 - 46.0 %  URINALYSIS, ROUTINE W REFLEX MICROSCOPIC  Status: Abnormal   Collection Time    07/09/13  4:53 PM      Result Value Ref Range   Color, Urine YELLOW  YELLOW   APPearance CLEAR  CLEAR   Specific Gravity, Urine 1.025  1.005 - 1.030   pH 5.5  5.0 - 8.0   Glucose, UA NEGATIVE  NEGATIVE mg/dL   Hgb urine dipstick NEGATIVE  NEGATIVE   Bilirubin Urine NEGATIVE  NEGATIVE   Ketones, ur 40 (*) NEGATIVE mg/dL   Protein, ur NEGATIVE  NEGATIVE mg/dL   Urobilinogen, UA 0.2  0.0 - 1.0 mg/dL   Nitrite NEGATIVE  NEGATIVE   Leukocytes, UA NEGATIVE  NEGATIVE   Comment: MICROSCOPIC NOT DONE ON URINES WITH NEGATIVE PROTEIN, BLOOD, LEUKOCYTES, NITRITE, OR GLUCOSE <1000 mg/dL.   Ct Abdomen Pelvis Wo Contrast  07/09/2013   CLINICAL DATA:  Left flank pain which began this morning question kidney stone  EXAM: CT ABDOMEN AND PELVIS WITHOUT CONTRAST  TECHNIQUE: Multidetector CT imaging of the abdomen and pelvis was performed following the standard protocol without intravenous contrast. Sagittal and coronal MPR images reconstructed from axial data set.  COMPARISON:  None  FINDINGS: Minimal atelectasis at right lung base dependently.  Coronary arterial  calcifications.  Small cyst medially right lobe liver image 27 15 x 10 mm.  9 mm low-attenuation focus right kidney, nonspecific, could represent a tiny cyst.  Tiny nonobstructing calculi at lower pole left kidney.  Liver, spleen, pancreas, kidneys, and adrenal glands otherwise normal.  Specifically no ureteral calcification, hydronephrosis or ureteral dilatation.  Sigmoid diverticulosis without evidence of diverticulitis.  Bowel loops are unopacified and under distended.  Free intraperitoneal air identified in the upper abdomen.  Additionally, air is seen within the mesentery in the mid abdomen with a small gas and fluid collection within the left mid abdomen, 3.6 x 2.5 x 3.4 cm, image 42 question abscess.  Free intraperitoneal air and mesenteric gas collection appear to be related to small bowel loops in the left mid abdomen suggesting small bowel source for perforation.  Mild infiltrative changes are seen adjacent to the transverse and descending colon of these may be secondary to the small bowel etiology.  Several small bowel loops in the left mid abdomen show a questionable wall thickening, images 40-52.  Stomach unremarkable.  Scattered infiltrative changes of omental and mesenteric fat without gross free intraperitoneal fluid.  No acute osseous findings.  IMPRESSION: Free intraperitoneal air compatible with perforated viscus.  Mesenteric gas in a small mesenteric abscess collection are seen in the left mid abdomen suggesting small bowel etiology for perforation, with several suspected thickened small bowel loops.  Mild scattered edematous changes.  Tiny nonobstructing left renal calculi without hydronephrosis or ureteral dilatation.  Sigmoid diverticulosis.  Small hepatic and suspected right renal cysts.  Critical Value/emergent results were called by telephone at the time of interpretation on 07/09/2013 at 5:55 PM to Dr. Threasa Beards BELFI , who verbally acknowledged these results.   Electronically Signed   By:  Lavonia Dana M.D.   On: 07/09/2013 17:56    Review of Systems  Constitutional: Positive for fever.  HENT: Negative.   Eyes: Negative.   Respiratory: Positive for cough.   Cardiovascular: Negative.   Gastrointestinal: Positive for nausea, abdominal pain and diarrhea.  Genitourinary: Negative.   Musculoskeletal: Negative.   Skin: Negative.   Neurological: Negative.   Endo/Heme/Allergies: Negative.   Psychiatric/Behavioral: Negative.     Blood pressure 119/81, pulse 104, temperature 98.6 F (37 C), temperature source  Oral, resp. rate 20, SpO2 95.00%. Physical Exam  Constitutional: She is oriented to person, place, and time. She appears well-developed and well-nourished. No distress.  HENT:  Head: Normocephalic and atraumatic.  Right Ear: External ear normal.  Left Ear: External ear normal.  Eyes: Conjunctivae are normal. Pupils are equal, round, and reactive to light. No scleral icterus.  Neck: Normal range of motion. Neck supple. No thyromegaly present.  Cardiovascular: Normal rate and regular rhythm.   Murmur (grade 2 systolic upper left sternal border) heard. Respiratory: Effort normal and breath sounds normal. She has no wheezes. She has no rales.  GI: Soft. She exhibits distension. She exhibits no mass. There is tenderness (LLQ). There is guarding.  Musculoskeletal: Normal range of motion. She exhibits no edema.  Neurological: She is alert and oriented to person, place, and time.  Skin: Skin is warm and dry.  Psychiatric: She has a normal mood and affect. Her behavior is normal.     Assessment/Plan Perforated viscus, left lower quadrant abdomen  Plan exploratory laparotomy, probable bowel resection, possible ostomy  I discussed the above findings at length with the patient and her husband at the bedside. I explained the need for urgent surgical intervention. We discussed the possibility of colon resection and small bowel resection. We discussed the potential need for  exteriorizing the colon. We discussed the hospital stay to be anticipated. They understand and wish to proceed. I have made arrangements with the operating room for urgent surgical intervention.  The risks and benefits of the procedure have been discussed at length with the patient.  The patient understands the proposed procedure, potential alternative treatments, and the course of recovery to be expected.  All of the patient's questions have been answered at this time.  The patient wishes to proceed with surgery.  Earnstine Regal, MD, [facility] Office: [place] 07/09/2013, 6:45 PM

## 2013-07-09 NOTE — Anesthesia Preprocedure Evaluation (Addendum)
Anesthesia Evaluation  Patient identified by MRN, date of birth, ID band Patient awake    Reviewed: Allergy & Precautions, H&P , NPO status , Patient's Chart, lab work & pertinent test results  Airway Mallampati: II TM Distance: >3 FB Neck ROM: Full    Dental  (+) Caps, Dental Advisory Given   Pulmonary former smoker,  Has had a cough and a "cold" the past week. Denies Dyspnea. breath sounds clear to auscultation  Pulmonary exam normal       Cardiovascular Exercise Tolerance: Good hypertension, Pt. on medications Rhythm:Regular Rate:Normal     Neuro/Psych negative neurological ROS  negative psych ROS   GI/Hepatic negative GI ROS, Neg liver ROS,   Endo/Other  negative endocrine ROS  Renal/GU Renal diseaseH/O kidney stones.  negative genitourinary   Musculoskeletal negative musculoskeletal ROS (+)   Abdominal   Peds negative pediatric ROS (+)  Hematology negative hematology ROS (+)   Anesthesia Other Findings   Reproductive/Obstetrics negative OB ROS                          Anesthesia Physical Anesthesia Plan  ASA: II and emergent  Anesthesia Plan: General   Post-op Pain Management:    Induction: Intravenous  Airway Management Planned: Oral ETT  Additional Equipment:   Intra-op Plan:   Post-operative Plan: Extubation in OR  Informed Consent: I have reviewed the patients History and Physical, chart, labs and discussed the procedure including the risks, benefits and alternatives for the proposed anesthesia with the patient or authorized representative who has indicated his/her understanding and acceptance.   Dental advisory given  Plan Discussed with: CRNA  Anesthesia Plan Comments:         Anesthesia Quick Evaluation

## 2013-07-09 NOTE — ED Notes (Signed)
Bed: XU38 Expected date: 07/09/13 Expected time: 3:43 PM Means of arrival: Ambulance Comments: Room 24 Constipation

## 2013-07-09 NOTE — Transfer of Care (Signed)
Immediate Anesthesia Transfer of Care Note  Patient: Ellen [name]  Procedure(s) Performed: Procedure(s) (LRB): EXPLORATORY LAPAROTOMY (N/A) SMALL BOWEL RESECTION  Patient Location: PACU  Anesthesia Type: General  Level of Consciousness: sedated, patient cooperative and responds to stimulation  Airway & Oxygen Therapy: Patient Spontanous Breathing and Patient connected to face mask oxgen  Post-op Assessment: Report given to PACU RN and Post -op Vital signs reviewed and stable  Post vital signs: Reviewed and stable  Complications: No apparent anesthesia complications

## 2013-07-09 NOTE — ED Provider Notes (Addendum)
CSN: [id number]     Arrival date & time 07/09/13  1544 History   First MD Initiated Contact with Patient 07/09/13 1617     Chief Complaint  Patient presents with  . Flank Pain     (Consider location/radiation/quality/duration/timing/severity/associated sxs/prior Treatment) HPI Comments: Patient presents with left flank pain. She states it started earlier this morning and is been constant since that time. She describes as a sharp crampy pain in her left midabdomen. Otherwise nonradiating. She does have a history of kidney stones with the last one being about 7 years ago. She states this pain feels similar to that. She also reports some diarrhea that's been going on for the last 2 or 3 days. It's watery diarrhea fluctuates from day-to-day in the amount of stool she has. She had some nausea and vomiting with her flank pain today. She denies he fevers or chills. She had a recent upper respiratory infection with associated cough that has been improving. She denies any urinary symptoms or hematuria.  Patient is a 73 y.o. female presenting with flank pain.  Flank Pain Associated symptoms include abdominal pain. Pertinent negatives include no chest pain, no headaches and no shortness of breath.    Past Medical History  Diagnosis Date  . Kidney stones    History reviewed. No pertinent past surgical history. No family history on file. History  Substance Use Topics  . Smoking status: Former Research scientist (life sciences)  . Smokeless tobacco: Not on file  . Alcohol Use: Not on file   OB History   Grav Para Term Preterm Abortions TAB SAB Ect Mult Living                 Review of Systems  Constitutional: Positive for fatigue. Negative for fever, chills and diaphoresis.  HENT: Positive for congestion, rhinorrhea and sneezing.   Eyes: Negative.   Respiratory: Positive for cough. Negative for chest tightness and shortness of breath.   Cardiovascular: Negative for chest pain and leg swelling.  Gastrointestinal:  Positive for nausea, vomiting, abdominal pain and diarrhea. Negative for blood in stool.  Genitourinary: Positive for flank pain. Negative for frequency, hematuria and difficulty urinating.  Musculoskeletal: Negative for arthralgias and back pain.  Skin: Negative for rash.  Neurological: Negative for dizziness, speech difficulty, weakness, numbness and headaches.      Allergies  Sulfa antibiotics  Home Medications   Current Outpatient Rx  Name  Route  Sig  Dispense  Refill  . diltiazem (CARDIZEM) 120 MG tablet   Oral   Take 120 mg by mouth daily.         Marland Kitchen docusate sodium (COLACE) 100 MG capsule   Oral   Take 100 mg by mouth daily as needed for mild constipation (constipation).         Marland Kitchen estrogen, conjugated,-medroxyprogesterone (PREMPRO) 0.45-1.5 MG per tablet   Oral   Take 1 tablet by mouth every other day.         . hydrochlorothiazide (HYDRODIURIL) 12.5 MG tablet   Oral   Take 12.5 mg by mouth daily.         . naproxen sodium (ANAPROX) 220 MG tablet   Oral   Take 220 mg by mouth daily.          BP 163/75  Pulse 92  Temp(Src) 97.6 F (36.4 C) (Oral)  Resp 18  SpO2 100% Physical Exam  Constitutional: She is oriented to person, place, and time. She appears well-developed and well-nourished.  HENT:  Head: Normocephalic and  atraumatic.  Mouth/Throat: Oropharynx is clear and moist.  Eyes: Pupils are equal, round, and reactive to light.  Neck: Normal range of motion. Neck supple.  Cardiovascular: Normal rate, regular rhythm and normal heart sounds.   Pulmonary/Chest: Effort normal and breath sounds normal. No respiratory distress. She has no wheezes. She has no rales. She exhibits no tenderness.  Abdominal: Soft. Bowel sounds are normal. There is tenderness (Moderate tenderness to the left leg and midabdomen). There is no rebound and no guarding.  Musculoskeletal: Normal range of motion. She exhibits no edema.  Lymphadenopathy:    She has no cervical  adenopathy.  Neurological: She is alert and oriented to person, place, and time.  Skin: Skin is warm and dry. No rash noted.  Psychiatric: She has a normal mood and affect.    ED Course  Procedures (including critical care time) Labs Review Results for orders placed during the hospital encounter of 07/09/13  URINALYSIS, ROUTINE W REFLEX MICROSCOPIC      Result Value Ref Range   Color, Urine YELLOW  YELLOW   APPearance CLEAR  CLEAR   Specific Gravity, Urine 1.025  1.005 - 1.030   pH 5.5  5.0 - 8.0   Glucose, UA NEGATIVE  NEGATIVE mg/dL   Hgb urine dipstick NEGATIVE  NEGATIVE   Bilirubin Urine NEGATIVE  NEGATIVE   Ketones, ur 40 (*) NEGATIVE mg/dL   Protein, ur NEGATIVE  NEGATIVE mg/dL   Urobilinogen, UA 0.2  0.0 - 1.0 mg/dL   Nitrite NEGATIVE  NEGATIVE   Leukocytes, UA NEGATIVE  NEGATIVE  CBC WITH DIFFERENTIAL      Result Value Ref Range   WBC 2.9 (*) 4.0 - 10.5 K/uL   RBC 5.14 (*) 3.87 - 5.11 MIL/uL   Hemoglobin 16.8 (*) 12.0 - 15.0 g/dL   HCT 48.2 (*) 36.0 - 46.0 %   MCV 93.8  78.0 - 100.0 fL   MCH 32.7  26.0 - 34.0 pg   MCHC 34.9  30.0 - 36.0 g/dL   RDW 12.3  11.5 - 15.5 %   Platelets 132 (*) 150 - 400 K/uL   Neutrophils Relative % 76  43 - 77 %   Neutro Abs 2.2  1.7 - 7.7 K/uL   Lymphocytes Relative 17  12 - 46 %   Lymphs Abs 0.5 (*) 0.7 - 4.0 K/uL   Monocytes Relative 7  3 - 12 %   Monocytes Absolute 0.2  0.1 - 1.0 K/uL   Eosinophils Relative 0  0 - 5 %   Eosinophils Absolute 0.0  0.0 - 0.7 K/uL   Basophils Relative 0  0 - 1 %   Basophils Absolute 0.0  0.0 - 0.1 K/uL  I-STAT CHEM 8, ED      Result Value Ref Range   Sodium 138  137 - 147 mEq/L   Potassium 3.0 (*) 3.7 - 5.3 mEq/L   Chloride 99  96 - 112 mEq/L   BUN 21  6 - 23 mg/dL   Creatinine, Ser 0.80  0.50 - 1.10 mg/dL   Glucose, Bld 136 (*) 70 - 99 mg/dL   Calcium, Ion 0.99 (*) 1.13 - 1.30 mmol/L   TCO2 25  0 - 100 mmol/L   Hemoglobin 16.3 (*) 12.0 - 15.0 g/dL   HCT 48.0 (*) 36.0 - 46.0 %   Ct Abdomen  Pelvis Wo Contrast  07/09/2013   CLINICAL DATA:  Left flank pain which began this morning question kidney stone  EXAM: CT ABDOMEN AND  PELVIS WITHOUT CONTRAST  TECHNIQUE: Multidetector CT imaging of the abdomen and pelvis was performed following the standard protocol without intravenous contrast. Sagittal and coronal MPR images reconstructed from axial data set.  COMPARISON:  None  FINDINGS: Minimal atelectasis at right lung base dependently.  Coronary arterial calcifications.  Small cyst medially right lobe liver image 27 15 x 10 mm.  9 mm low-attenuation focus right kidney, nonspecific, could represent a tiny cyst.  Tiny nonobstructing calculi at lower pole left kidney.  Liver, spleen, pancreas, kidneys, and adrenal glands otherwise normal.  Specifically no ureteral calcification, hydronephrosis or ureteral dilatation.  Sigmoid diverticulosis without evidence of diverticulitis.  Bowel loops are unopacified and under distended.  Free intraperitoneal air identified in the upper abdomen.  Additionally, air is seen within the mesentery in the mid abdomen with a small gas and fluid collection within the left mid abdomen, 3.6 x 2.5 x 3.4 cm, image 42 question abscess.  Free intraperitoneal air and mesenteric gas collection appear to be related to small bowel loops in the left mid abdomen suggesting small bowel source for perforation.  Mild infiltrative changes are seen adjacent to the transverse and descending colon of these may be secondary to the small bowel etiology.  Several small bowel loops in the left mid abdomen show a questionable wall thickening, images 40-52.  Stomach unremarkable.  Scattered infiltrative changes of omental and mesenteric fat without gross free intraperitoneal fluid.  No acute osseous findings.  IMPRESSION: Free intraperitoneal air compatible with perforated viscus.  Mesenteric gas in a small mesenteric abscess collection are seen in the left mid abdomen suggesting small bowel etiology for  perforation, with several suspected thickened small bowel loops.  Mild scattered edematous changes.  Tiny nonobstructing left renal calculi without hydronephrosis or ureteral dilatation.  Sigmoid diverticulosis.  Small hepatic and suspected right renal cysts.  Critical Value/emergent results were called by telephone at the time of interpretation on 07/09/2013 at 5:55 PM to Dr. Threasa Beards Kendle Erker , who verbally acknowledged these results.   Electronically Signed   By: Lavonia Dana M.D.   On: 07/09/2013 17:56     Imaging Review Ct Abdomen Pelvis Wo Contrast  07/09/2013   CLINICAL DATA:  Left flank pain which began this morning question kidney stone  EXAM: CT ABDOMEN AND PELVIS WITHOUT CONTRAST  TECHNIQUE: Multidetector CT imaging of the abdomen and pelvis was performed following the standard protocol without intravenous contrast. Sagittal and coronal MPR images reconstructed from axial data set.  COMPARISON:  None  FINDINGS: Minimal atelectasis at right lung base dependently.  Coronary arterial calcifications.  Small cyst medially right lobe liver image 27 15 x 10 mm.  9 mm low-attenuation focus right kidney, nonspecific, could represent a tiny cyst.  Tiny nonobstructing calculi at lower pole left kidney.  Liver, spleen, pancreas, kidneys, and adrenal glands otherwise normal.  Specifically no ureteral calcification, hydronephrosis or ureteral dilatation.  Sigmoid diverticulosis without evidence of diverticulitis.  Bowel loops are unopacified and under distended.  Free intraperitoneal air identified in the upper abdomen.  Additionally, air is seen within the mesentery in the mid abdomen with a small gas and fluid collection within the left mid abdomen, 3.6 x 2.5 x 3.4 cm, image 42 question abscess.  Free intraperitoneal air and mesenteric gas collection appear to be related to small bowel loops in the left mid abdomen suggesting small bowel source for perforation.  Mild infiltrative changes are seen adjacent to the  transverse and descending colon of these may be secondary  to the small bowel etiology.  Several small bowel loops in the left mid abdomen show a questionable wall thickening, images 40-52.  Stomach unremarkable.  Scattered infiltrative changes of omental and mesenteric fat without gross free intraperitoneal fluid.  No acute osseous findings.  IMPRESSION: Free intraperitoneal air compatible with perforated viscus.  Mesenteric gas in a small mesenteric abscess collection are seen in the left mid abdomen suggesting small bowel etiology for perforation, with several suspected thickened small bowel loops.  Mild scattered edematous changes.  Tiny nonobstructing left renal calculi without hydronephrosis or ureteral dilatation.  Sigmoid diverticulosis.  Small hepatic and suspected right renal cysts.  Critical Value/emergent results were called by telephone at the time of interpretation on 07/09/2013 at 5:55 PM to Dr. Threasa Beards Johnnae Impastato , who verbally acknowledged these results.   Electronically Signed   By: Lavonia Dana M.D.   On: 07/09/2013 17:56    EKG Interpretation   None       MDM   Final diagnoses:  Perforated intestine  Mesenteric abscess    Patient presents with left-sided abdominal pain. She has evidence of a perforated viscus on CT. Also evidence of a mesenteric abscess. She was given IV fluids. She was kept n.p.o. She was started on broad-spectrum antibiotics. I consulted Dr. Catalina Antigua who will admit the patient.    Malvin Johns, MD 07/09/13 4010  Malvin Johns, MD 07/09/13 2725

## 2013-07-10 ENCOUNTER — Inpatient Hospital Stay (HOSPITAL_COMMUNITY): Payer: Medicare Other

## 2013-07-10 ENCOUNTER — Encounter (HOSPITAL_COMMUNITY): Payer: Self-pay | Admitting: Surgery

## 2013-07-10 LAB — BASIC METABOLIC PANEL
BUN: 15 mg/dL (ref 6–23)
CO2: 26 meq/L (ref 19–32)
CREATININE: 0.62 mg/dL (ref 0.50–1.10)
Calcium: 8.1 mg/dL — ABNORMAL LOW (ref 8.4–10.5)
Chloride: 100 mEq/L (ref 96–112)
GFR calc Af Amer: >90 mL/min (ref >90)
GFR calc non Af Amer: 87 mL/min — ABNORMAL LOW (ref >90)
Glucose, Bld: 176 mg/dL — ABNORMAL HIGH (ref 70–99)
Potassium: 3.3 mEq/L — ABNORMAL LOW (ref 3.7–5.3)
Sodium: 137 mEq/L (ref 137–147)

## 2013-07-10 LAB — CBC
HCT: 43.3 % (ref 36.0–46.0)
HEMOGLOBIN: 15.1 g/dL — AB (ref 12.0–15.0)
MCH: 32.4 pg (ref 26.0–34.0)
MCHC: 34.9 g/dL (ref 30.0–36.0)
MCV: 92.9 fL (ref 78.0–100.0)
PLATELETS: 121 10*3/uL — AB (ref 150–400)
RBC: 4.66 MIL/uL (ref 3.87–5.11)
RDW: 12.5 % (ref 11.5–15.5)
WBC: 8.3 10*3/uL (ref 4.0–10.5)

## 2013-07-10 LAB — URINE CULTURE
Colony Count: NO GROWTH
Culture: NO GROWTH

## 2013-07-10 MED ORDER — PANTOPRAZOLE SODIUM 40 MG IV SOLR
40.0000 mg | Freq: Two times a day (BID) | INTRAVENOUS | Status: DC
  Administered 2013-07-10 – 2013-07-15 (×12): 40 mg via INTRAVENOUS
  Filled 2013-07-10 (×14): qty 40

## 2013-07-10 MED ORDER — PIPERACILLIN-TAZOBACTAM 3.375 G IVPB
3.3750 g | Freq: Three times a day (TID) | INTRAVENOUS | Status: DC
  Administered 2013-07-10 – 2013-07-16 (×19): 3.375 g via INTRAVENOUS
  Filled 2013-07-10 (×21): qty 50

## 2013-07-10 MED ORDER — KCL IN DEXTROSE-NACL 40-5-0.9 MEQ/L-%-% IV SOLN
INTRAVENOUS | Status: DC
  Administered 2013-07-10 – 2013-07-15 (×4): via INTRAVENOUS
  Filled 2013-07-10 (×12): qty 1000

## 2013-07-10 NOTE — Progress Notes (Signed)
1 Day Post-Op  Subjective: She is in good spirits post op.  Awake hurts to move much.  NG in place, not much from it in current cannister.     Objective: Vital signs in last 24 hours: Temp:  [97.6 F (36.4 C)-98.7 F (37.1 C)] 97.9 F (36.6 C) (02/23 0515) Pulse Rate:  [88-104] 92 (02/23 0515) Resp:  [15-20] 16 (02/23 0515) BP: (119-191)/(57-128) 138/77 mmHg (02/23 0515) SpO2:  [91 %-100 %] 95 % (02/23 0515) Weight:  [62.4 kg (137 lb 9.1 oz)] 62.4 kg (137 lb 9.1 oz) (02/22 2245) Last BM Date: 07/08/13 Nothing PO, no NG drainage recorded. Afebrile, VSS K+ 3.3 Platelets still low, H/H WBC is WNL Intake/Output from previous day: 02/22 0701 - 02/23 0700 In: 2852 [I.V.:2802; IV Piggyback:50] Out: 650 [Urine:600; Blood:50] Intake/Output this shift:    General appearance:  WN WD female, no distress. Lungs:  Good inspiration, hurts to move I gave her a shot during exam.   Abd"  Dressing intact, waffle dressing some drainage.  No bowel sounds she looks distended.   Lab Results:   Recent Labs  07/09/13 1613 07/09/13 1624 07/10/13 0433  WBC 2.9*  --  8.3  HGB 16.8* 16.3* 15.1*  HCT 48.2* 48.0* 43.3  PLT 132*  --  121*    BMET  Recent Labs  07/09/13 1624 07/10/13 0433  NA 138 137  K 3.0* 3.3*  CL 99 100  CO2  --  26  GLUCOSE 136* 176*  BUN 21 15  CREATININE 0.80 0.62  CALCIUM  --  8.1*   PT/INR No results found for this basename: LABPROT, INR,  in the last 72 hours  No results found for this basename: AST, ALT, ALKPHOS, BILITOT, PROT, ALBUMIN,  in the last 168 hours   Lipase  No results found for this basename: lipase     Studies/Results: Ct Abdomen Pelvis Wo Contrast  07/09/2013   CLINICAL DATA:  Left flank pain which began this morning question kidney stone  EXAM: CT ABDOMEN AND PELVIS WITHOUT CONTRAST  TECHNIQUE: Multidetector CT imaging of the abdomen and pelvis was performed following the standard protocol without intravenous contrast. Sagittal and  coronal MPR images reconstructed from axial data set.  COMPARISON:  None  FINDINGS: Minimal atelectasis at right lung base dependently.  Coronary arterial calcifications.  Small cyst medially right lobe liver image 27 15 x 10 mm.  9 mm low-attenuation focus right kidney, nonspecific, could represent a tiny cyst.  Tiny nonobstructing calculi at lower pole left kidney.  Liver, spleen, pancreas, kidneys, and adrenal glands otherwise normal.  Specifically no ureteral calcification, hydronephrosis or ureteral dilatation.  Sigmoid diverticulosis without evidence of diverticulitis.  Bowel loops are unopacified and under distended.  Free intraperitoneal air identified in the upper abdomen.  Additionally, air is seen within the mesentery in the mid abdomen with a small gas and fluid collection within the left mid abdomen, 3.6 x 2.5 x 3.4 cm, image 42 question abscess.  Free intraperitoneal air and mesenteric gas collection appear to be related to small bowel loops in the left mid abdomen suggesting small bowel source for perforation.  Mild infiltrative changes are seen adjacent to the transverse and descending colon of these may be secondary to the small bowel etiology.  Several small bowel loops in the left mid abdomen show a questionable wall thickening, images 40-52.  Stomach unremarkable.  Scattered infiltrative changes of omental and mesenteric fat without gross free intraperitoneal fluid.  No acute osseous findings.  IMPRESSION: Free intraperitoneal air compatible with perforated viscus.  Mesenteric gas in a small mesenteric abscess collection are seen in the left mid abdomen suggesting small bowel etiology for perforation, with several suspected thickened small bowel loops.  Mild scattered edematous changes.  Tiny nonobstructing left renal calculi without hydronephrosis or ureteral dilatation.  Sigmoid diverticulosis.  Small hepatic and suspected right renal cysts.  Critical Value/emergent results were called by  telephone at the time of interpretation on 07/09/2013 at 5:55 PM to Dr. Threasa Beards BELFI , who verbally acknowledged these results.   Electronically Signed   By: Lavonia Dana M.D.   On: 07/09/2013 17:56    Medications: . enoxaparin (LOVENOX) injection  40 mg Subcutaneous Q24H  . HYDROmorphone      . HYDROmorphone PCA 0.3 mg/mL   Intravenous 6 times per day  . HYDROmorphone PCA 0.3 mg/mL      . piperacillin-tazobactam (ZOSYN)  IV  3.375 g Intravenous 3 times per day   . dextrose 5 % and 0.45 % NaCl with KCl 20 mEq/L 100 mL/hr at 07/09/13 2200   Prior to Admission medications   Medication Sig Start Date End Date Taking? Authorizing Provider  diltiazem (CARDIZEM) 120 MG tablet Take 120 mg by mouth daily.   Yes Historical Provider, MD  docusate sodium (COLACE) 100 MG capsule Take 100 mg by mouth daily as needed for mild constipation (constipation).   Yes Historical Provider, MD  estrogen, conjugated,-medroxyprogesterone (PREMPRO) 0.45-1.5 MG per tablet Take 1 tablet by mouth every other day.   Yes Historical Provider, MD  hydrochlorothiazide (HYDRODIURIL) 12.5 MG tablet Take 12.5 mg by mouth daily.   Yes Historical Provider, MD  naproxen sodium (ANAPROX) 220 MG tablet Take 220 mg by mouth daily.   Yes Historical Provider, MD     Assessment/Plan Perforated jejunal diverticulum. S/p Exploratory Laparotomy/Small bowel resection of proximal jejunum with primary anastomosis.  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 S/p Cataracts Hx tobacco, 30 years, quit at age 89, cough last week. Lovenox DVT ordered   Plan:  IS, OOB to chair today, d/c foley start walking tomorrow.  Continue NG for now.  Her blood pressure is fine now, will watch.  Continue antibiotics, Zosyn started yesterday at 1833 hours.  Add KCL to IV to increase K+. Labs ordered daily.   LOS: 1 day    Jakaleb Payer 07/10/2013

## 2013-07-10 NOTE — Progress Notes (Signed)
UR completed. Kenyatta Keidel RN CCM Case Mgmt phone [phone] 

## 2013-07-10 NOTE — Anesthesia Postprocedure Evaluation (Signed)
  Anesthesia Post-op Note  Patient: Ellen [name]  Procedure(s) Performed: Procedure(s) (LRB): EXPLORATORY LAPAROTOMY (N/A) SMALL BOWEL RESECTION  Patient Location: PACU  Anesthesia Type: General  Level of Consciousness: awake and alert   Airway and Oxygen Therapy: Patient Spontanous Breathing  Post-op Pain: mild  Post-op Assessment: Post-op Vital signs reviewed, Patient's Cardiovascular Status Stable, Respiratory Function Stable, Patent Airway and No signs of Nausea or vomiting  Last Vitals:  Filed Vitals:   07/10/13 1138  BP:   Pulse:   Temp:   Resp: 18    Post-op Vital Signs: stable   Complications: No apparent anesthesia complications

## 2013-07-10 NOTE — Op Note (Signed)
NAMEJAHNAY, Ellen [name] NO.:  [id number]  MEDICAL RECORD NO.:  [id number]  LOCATION:  3875                         FACILITY:  [facility]  PHYSICIAN:  Earnstine Regal, MD      DATE OF BIRTH:  May 15, 1941  DATE OF PROCEDURE:  07/09/2013                              OPERATIVE REPORT   PREOPERATIVE DIAGNOSIS:  Perforated viscus.  POSTOPERATIVE DIAGNOSIS:  Perforated jejunal diverticulum.  PROCEDURE: 1. Exploratory laparotomy. 2. Small bowel resection of proximal jejunum with primary anastomosis.  SURGEON:  Earnstine Regal, MD, FACS  ANESTHESIA:  General per Dr. Franne Grip.  ESTIMATED BLOOD LOSS:  Minimal.  PREPARATION:  ChloraPrep.  COMPLICATIONS:  None.  INDICATIONS:  The patient is a 73 year old female who presents to the emergency department with 1-day history of abdominal pain localizing to the left lower quadrant of the abdomen.  The patient had experienced fever 3 days prior to admission.  She began having diarrhea.  She developed left lower quadrant abdominal pain.  In the emergency department, laboratory studies showed a low white blood cell count of 2.9.  CT scan of the abdomen and pelvis showed findings consistent with perforated viscus, probably from a loop of small intestine, with air tracking in the small bowel mesentery.  General Surgery was consulted and the patient was prepared for the operating room.  BODY OF REPORT:  Procedures done in OR #1 at the [facility]  The patient was brought to the operating room, placed in supine position on the operating room table.  Following administration of general anesthesia, the patient was positioned and then prepped and draped in the usual strict aseptic fashion.  After ascertaining that an adequate level of anesthesia had been achieved, a midline abdominal incision was made with a #10 blade.  Dissection was carried through subcutaneous tissues.  Fascia was incised in the midline.   Peritoneal cavity was entered cautiously.  Upon entering the peritoneal cavity, there was a moderate amount of purulent fluid present.  Exploration reveals an inflamed proximal small bowel, extending from just below the ligament of Treitz to approximately the end of the jejunum.  There were multiple jejunal diverticula.  There was a segment of small bowel which was somewhat adherent in the left lower quadrant.  Upon mobilization, there were multiple diverticula in the section of the jejunum.  There was obvious air within the mesentery and crepitus.  There was no gross perforation visible.  Suture was used to mark the most severe inflammatory changes noted.  A point in the proximal jejunum below the level of the ligament of Treitz was selected and transected with a GIA stapler.  A section of bowel below the significant inflammatory changes in the distal jejunum was transected with a GIA stapler.  Mesentery was divided using the ligature.  Dissection of the small bowel was completely resected and submitted to Pathology.  It measures approximately 50 cm in length.  Next, a side-to-side functionally end-to-end anastomosis was created between the distal jejunum and the proximal jejunum.  The anastomosis was created with a GIA stapler.  The staple line was inspected for hemostasis.  Enterotomy was closed with a TA60 stapler.  Mesenteric defect was closed with interrupted 2-0 silk sutures.  Abdomen was then irrigated copiously with warm saline which was evacuated.  Good hemostasis was noted.  Omentum was used to cover the anastomosis.  The remainder of the abdomen was explored.  The entire small bowel was run and there were no findings of inflammatory bowel disease other than the proximal diverticula.  The colon was grossly normal with secondary inflammatory changes in the proximal sigmoid colon.  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  The liver and gallbladder and stomach were all grossly  normal.  The nasogastric tube was positioned within the stomach.  Midline incision was closed with interrupted 0 Novafil simple sutures. Subcutaneous tissues were irrigated.  Skin was closed with stainless steel staples.  Wounds were washed and dried and sterile dressings were applied.  The patient was awakened from anesthesia and brought to the recovery room.  The patient tolerated the procedure well.   Earnstine Regal, MD, [facility] Office: [phone]    TMG/MEDQ  D:  07/09/2013  T:  07/10/2013  Job:  [id number]

## 2013-07-10 NOTE — Progress Notes (Signed)
ATTENDING ADDENDUM:  I personally reviewed patient's record, examined the patient, and formulated the following assessment and plan:  Doing well.  NG draining bilious fluid.  Abd distended.  Cont current management.  Agree with PA A&P

## 2013-07-10 NOTE — Progress Notes (Signed)
ANTIBIOTIC CONSULT NOTE - INITIAL  Pharmacy Consult for zosyn Indication: Peritonitis  Allergies  Allergen Reactions  . Sulfa Antibiotics Nausea And Vomiting and Swelling    Patient Measurements: Height: 5\' 4"  (162.6 cm) Weight: 137 lb 9.1 oz (62.4 kg) IBW/kg (Calculated) : 54.7 Adjusted Body Weight:   Vital Signs: Temp: 97.7 F (36.5 C) (02/23 0052) Temp src: Oral (02/23 0052) BP: 139/79 mmHg (02/23 0052) Pulse Rate: 95 (02/23 0052) Intake/Output from previous day: 02/22 0701 - 02/23 0700 In: 2200 [I.V.:2200] Out: 350 [Urine:300; Blood:50] Intake/Output from this shift: Total I/O In: 2200 [I.V.:2200] Out: 350 [Urine:300; Blood:50]  Labs:  Recent Labs  07/09/13 1613 07/09/13 1624  WBC 2.9*  --   HGB 16.8* 16.3*  PLT 132*  --   CREATININE 0.68 0.80   Estimated Creatinine Clearance: 53.3 ml/min (by C-G formula based on Cr of 0.8). No results found for this basename: VANCOTROUGH, VANCOPEAK, VANCORANDOM, GENTTROUGH, GENTPEAK, GENTRANDOM, TOBRATROUGH, TOBRAPEAK, TOBRARND, AMIKACINPEAK, AMIKACINTROU, AMIKACIN,  in the last 72 hours   Microbiology: No results found for this or any previous visit (from the past 720 hour(s)).  Medical History: Past Medical History  Diagnosis Date  . Kidney stones     Medications:  Anti-infectives   Start     Dose/Rate Route Frequency Ordered Stop   07/10/13 0200  piperacillin-tazobactam (ZOSYN) IVPB 3.375 g     3.375 g 12.5 mL/hr over 240 Minutes Intravenous 3 times per day 07/10/13 0153     07/09/13 1830  piperacillin-tazobactam (ZOSYN) IVPB 3.375 g     3.375 g 100 mL/hr over 30 Minutes Intravenous  Once 07/09/13 1817 07/09/13 1903     Assessment: Patient with Peritonitis.  First dose of antibiotics already given.   Goal of Therapy:  Zosyn based on renal function   Plan:  Zosyn 3.375g IV Q8H infused over 4hrs.   Tyler Deis, Shea Stakes Crowford 07/10/2013,1:54 AM

## 2013-07-11 ENCOUNTER — Inpatient Hospital Stay (HOSPITAL_COMMUNITY): Payer: Medicare Other

## 2013-07-11 DIAGNOSIS — K5712 Diverticulitis of small intestine without perforation or abscess without bleeding: Secondary | ICD-10-CM | POA: Diagnosis present

## 2013-07-11 LAB — BASIC METABOLIC PANEL
BUN: 17 mg/dL (ref 6–23)
CALCIUM: 8.1 mg/dL — AB (ref 8.4–10.5)
CHLORIDE: 104 meq/L (ref 96–112)
CO2: 25 meq/L (ref 19–32)
Creatinine, Ser: 0.65 mg/dL (ref 0.50–1.10)
GFR calc Af Amer: >90 mL/min (ref >90)
GFR calc non Af Amer: 85 mL/min — ABNORMAL LOW (ref >90)
GLUCOSE: 142 mg/dL — AB (ref 70–99)
Potassium: 3.6 mEq/L — ABNORMAL LOW (ref 3.7–5.3)
Sodium: 139 mEq/L (ref 137–147)

## 2013-07-11 LAB — CBC
HEMATOCRIT: 40.7 % (ref 36.0–46.0)
Hemoglobin: 13.9 g/dL (ref 12.0–15.0)
MCH: 32.7 pg (ref 26.0–34.0)
MCHC: 34.2 g/dL (ref 30.0–36.0)
MCV: 95.8 fL (ref 78.0–100.0)
PLATELETS: 110 10*3/uL — AB (ref 150–400)
RBC: 4.25 MIL/uL (ref 3.87–5.11)
RDW: 13.1 % (ref 11.5–15.5)
WBC: 10.4 10*3/uL (ref 4.0–10.5)

## 2013-07-11 MED ORDER — FUROSEMIDE 10 MG/ML IJ SOLN
20.0000 mg | Freq: Once | INTRAMUSCULAR | Status: AC
  Administered 2013-07-11: 20 mg via INTRAVENOUS
  Filled 2013-07-11: qty 2

## 2013-07-11 MED ORDER — GUAIFENESIN ER 600 MG PO TB12
600.0000 mg | ORAL_TABLET | Freq: Two times a day (BID) | ORAL | Status: DC
  Administered 2013-07-11 – 2013-07-14 (×2): 600 mg via ORAL
  Filled 2013-07-11 (×12): qty 1

## 2013-07-11 MED ORDER — BENZONATATE 100 MG PO CAPS
100.0000 mg | ORAL_CAPSULE | Freq: Three times a day (TID) | ORAL | Status: DC | PRN
  Administered 2013-07-12 – 2013-07-15 (×8): 100 mg via ORAL
  Filled 2013-07-11 (×8): qty 1

## 2013-07-11 NOTE — Progress Notes (Signed)
2 Days Post-Op  Subjective: Doing ok.  Not sleeping much.  Cough becoming more productive.       Objective: Vital signs in last 24 hours: Temp:  [97.4 F (36.3 C)-98.4 F (36.9 C)] 97.4 F (36.3 C) (02/24 2725) Pulse Rate:  [91-104] 91 (02/24 0638) Resp:  [16-25] 16 (02/24 3664) BP: (124-140)/(67-83) 124/67 mmHg (02/24 0638) SpO2:  [2 %-100 %] 99 % (02/24 4034) Last BM Date: 07/08/13  Afebrile, VSS  Intake/Output from previous day: 02/23 0701 - 02/24 0700 In: -  Out: 925 [Urine:725; Emesis/NG output:200] Intake/Output this shift:    General appearance:  female, no distress. Lungs:  Good breath sounds Abd"  Dressing intact, waffle dressing some drainage.  Less distended.  Ng with bilious output.   Lab Results:   Recent Labs  07/10/13 0433 07/11/13 0403  WBC 8.3 10.4  HGB 15.1* 13.9  HCT 43.3 40.7  PLT 121* 110*    BMET  Recent Labs  07/10/13 0433 07/11/13 0403  NA 137 139  K 3.3* 3.6*  CL 100 104  CO2 26 25  GLUCOSE 176* 142*  BUN 15 17  CREATININE 0.62 0.65  CALCIUM 8.1* 8.1*   PT/INR No results found for this basename: LABPROT, INR,  in the last 72 hours  No results found for this basename: AST, ALT, ALKPHOS, BILITOT, PROT, ALBUMIN,  in the last 168 hours   Lipase  No results found for this basename: lipase     Studies/Results: Ct Abdomen Pelvis Wo Contrast  07/09/2013   CLINICAL DATA:  Left flank pain which began this morning question kidney stone  EXAM: CT ABDOMEN AND PELVIS WITHOUT CONTRAST  TECHNIQUE: Multidetector CT imaging of the abdomen and pelvis was performed following the standard protocol without intravenous contrast. Sagittal and coronal MPR images reconstructed from axial data set.  COMPARISON:  None  FINDINGS: Minimal atelectasis at right lung base dependently.  Coronary arterial calcifications.  Small cyst medially right lobe liver image 27 15 x 10 mm.  9 mm low-attenuation focus right kidney, nonspecific, could represent a tiny  cyst.  Tiny nonobstructing calculi at lower pole left kidney.  Liver, spleen, pancreas, kidneys, and adrenal glands otherwise normal.  Specifically no ureteral calcification, hydronephrosis or ureteral dilatation.  Sigmoid diverticulosis without evidence of diverticulitis.  Bowel loops are unopacified and under distended.  Free intraperitoneal air identified in the upper abdomen.  Additionally, air is seen within the mesentery in the mid abdomen with a small gas and fluid collection within the left mid abdomen, 3.6 x 2.5 x 3.4 cm, image 42 question abscess.  Free intraperitoneal air and mesenteric gas collection appear to be related to small bowel loops in the left mid abdomen suggesting small bowel source for perforation.  Mild infiltrative changes are seen adjacent to the transverse and descending colon of these may be secondary to the small bowel etiology.  Several small bowel loops in the left mid abdomen show a questionable wall thickening, images 40-52.  Stomach unremarkable.  Scattered infiltrative changes of omental and mesenteric fat without gross free intraperitoneal fluid.  No acute osseous findings.  IMPRESSION: Free intraperitoneal air compatible with perforated viscus.  Mesenteric gas in a small mesenteric abscess collection are seen in the left mid abdomen suggesting small bowel etiology for perforation, with several suspected thickened small bowel loops.  Mild scattered edematous changes.  Tiny nonobstructing left renal calculi without hydronephrosis or ureteral dilatation.  Sigmoid diverticulosis.  Small hepatic and suspected right renal cysts.  Critical  Value/emergent results were called by telephone at the time of interpretation on 07/09/2013 at 5:55 PM to Dr. Threasa Beards BELFI , who verbally acknowledged these results.   Electronically Signed   By: Lavonia Dana M.D.   On: 07/09/2013 17:56   Dg Chest 2 View  07/10/2013   CLINICAL DATA:  Cough and weakness  EXAM: CHEST  2 VIEW  COMPARISON:  CT  ABD/PELV WO CM dated 07/09/2013  FINDINGS: The lungs are reasonably well inflated. Atelectasis at the right lung base posteriorly is suspected. Small amounts of pleural fluid blunts the posterior costophrenic angles bilaterally. The cardiac silhouette is normal in size. The pulmonary vascularity is not engorged. 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 There is an esophagogastric tube present whose tip projects off the inferior margin of the film.  IMPRESSION: There is bibasilar subsegmental atelectasis as well as small bilateral pleural effusions. There is no evidence of pulmonary edema or alveolar pneumonia.   Electronically Signed   By: David  Martinique   On: 07/10/2013 15:45    Medications: . enoxaparin (LOVENOX) injection  40 mg Subcutaneous Q24H  . HYDROmorphone PCA 0.3 mg/mL   Intravenous 6 times per day  . pantoprazole (PROTONIX) IV  40 mg Intravenous BID  . piperacillin-tazobactam (ZOSYN)  IV  3.375 g Intravenous 3 times per day   . dextrose 5 % and 0.9 % NaCl with KCl 40 mEq/L 75 mL/hr at 07/10/13 1124   Prior to Admission medications   Medication Sig Start Date End Date Taking? Authorizing Provider  diltiazem (CARDIZEM) 120 MG tablet Take 120 mg by mouth daily.   Yes Historical Provider, MD  docusate sodium (COLACE) 100 MG capsule Take 100 mg by mouth daily as needed for mild constipation (constipation).   Yes Historical Provider, MD  estrogen, conjugated,-medroxyprogesterone (PREMPRO) 0.45-1.5 MG per tablet Take 1 tablet by mouth every other day.   Yes Historical Provider, MD  hydrochlorothiazide (HYDRODIURIL) 12.5 MG tablet Take 12.5 mg by mouth daily.   Yes Historical Provider, MD  naproxen sodium (ANAPROX) 220 MG tablet Take 220 mg by mouth daily.   Yes Historical Provider, MD     Assessment/Plan Perforated jejunal diverticulum. S/p Exploratory Laparotomy/Small bowel resection of proximal jejunum with primary anastomosis.  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 S/p Cataracts Hx tobacco, 30 years, quit at age 50, cough last week. Lovenox DVT ordered   Plan:  IS, ambulate.  Continue NG for now.  Will get AXR to check placement.    LOS: 2 days    Ellen Sallie C. 8/84/1660

## 2013-07-11 NOTE — Progress Notes (Signed)
Patient continues with cough and complains of "feeling worse" congestion has increased. Pt is complaining of being hot and is visibly diaphoretic. Paged md.

## 2013-07-11 NOTE — Evaluation (Signed)
Physical Therapy Evaluation Patient Details Name: Ellen [name] MRN: [id number] DOB: Apr 01, 1941 Today's Date: 07/11/2013 Time: 4259-5638 PT Time Calculation (min): 22 min  PT Assessment / Plan / Recommendation History of Present Illness  73 year old female who presents with one-day history of abdominal pain localizing to the left lower quadrant of the abdomen. Patient has had a severe cough for one week. She had fever 3 days ago. She began having diarrhea. She developed pain in the left lower quadrant of the abdomen. She presented to the emergency department for evaluation. Laboratory studies show a low white blood cell count of 2.9. CT scan abdomen and pelvis shows findings consistent with perforated viscus, probably from a loop of small intestine in the left lower quadrant of the abdomen, with a small abscess.  Pt currently s/p ex lap and has NG tube and PCA.  Clinical Impression  Pt currently with functional limitations due to the deficits listed below (see PT Problem List).  Pt will benefit from skilled PT to increase their independence and safety with mobility to allow discharge to the venue listed below.  Pt able to tolerate ambulating in hallway with RW for support.  Pt reports spouse has peripheral neuropathy and ambulates with SPC so she will need to be modified independent upon d/c and feel pt should be able to progress to this.     PT Assessment  Patient needs continued PT services    Follow Up Recommendations  No PT follow up (likely progress to no f/u)    Does the patient have the potential to tolerate intense rehabilitation      Barriers to Discharge        Equipment Recommendations  None recommended by PT    Recommendations for Other Services     Frequency Min 3X/week    Precautions / Restrictions Precautions Precautions: Fall Precaution Comments: NG tube, monitor sats   Pertinent Vitals/Pain Pt reports abdominal pain with transfers, declined PCA prior however within  reach upon return to recliner, repositioned to comfort Remained on 2L O2 [place]      Mobility  Bed Mobility General bed mobility comments: pt up in recliner on arrival Transfers Overall transfer level: Needs assistance Equipment used: Rolling walker (2 wheeled) Transfers: Sit to/from Stand Sit to Stand: Min guard Ambulation/Gait Ambulation/Gait assistance: Min guard Ambulation Distance (Feet): 200 Feet Assistive device: Rolling walker (2 wheeled) Gait Pattern/deviations: Step-through pattern;Decreased stride length Gait velocity: decr General Gait Details: slow pace, reports first time walking in a week, ambulated on 2L O2 [place] and SpO2 91%, verbal cues for posture as tolerated    Exercises     PT Diagnosis: Difficulty walking  PT Problem List: Decreased strength;Decreased activity tolerance;Decreased mobility;Pain;Decreased knowledge of use of DME PT Treatment Interventions: Gait training;DME instruction;Stair training;Functional mobility training;Therapeutic activities;Therapeutic exercise;Patient/family education     PT Goals(Current goals can be found in the care plan section) Acute Rehab PT Goals PT Goal Formulation: With patient Time For Goal Achievement: 07/25/13 Potential to Achieve Goals: Good  Visit Information  Last PT Received On: 07/11/13 Assistance Needed: +1 History of Present Illness: 73 year old female who presents with one-day history of abdominal pain localizing to the left lower quadrant of the abdomen. Patient has had a severe cough for one week. She had fever 3 days ago. She began having diarrhea. She developed pain in the left lower quadrant of the abdomen. She presented to the emergency department for evaluation. Laboratory studies show a low white blood cell count of 2.9. CT scan  abdomen and pelvis shows findings consistent with perforated viscus, probably from a loop of small intestine in the left lower quadrant of the abdomen, with a small abscess.  Pt  currently s/p ex lap and has NG tube and PCA.       Prior [place] expects to be discharged to:: Private residence Living Arrangements: Spouse/significant other Type of Home: House Home Access: Stairs to enter [employer] of Steps: 3 Entrance Stairs-Rails: Right Home Layout: One level Home Equipment: [place] - 2 wheels;Cane - single point Prior Function Level of Independence: Independent Communication Communication: No difficulties    Cognition  Cognition Arousal/Alertness: Awake/alert Behavior During Therapy: WFL for tasks assessed/performed Overall Cognitive Status: Within Functional Limits for tasks assessed    Extremity/Trunk Assessment Lower Extremity Assessment Lower Extremity Assessment: Generalized weakness   Balance    End of Session PT - End of Session Equipment Utilized During Treatment: Oxygen Activity Tolerance: Patient tolerated treatment well Patient left: in chair;with call bell/phone within reach  GP     Brahm Barbeau,KATHrine E 07/11/2013, 3:53 PM Carmelia Bake, PT, DPT 07/11/2013 Pager: [phone]

## 2013-07-11 NOTE — Progress Notes (Signed)
Paged PA regarding pts increased congestion and complaints of "feel like I cant breathe". Pt also requests changing iv fluids to include some sugar.

## 2013-07-11 NOTE — Progress Notes (Signed)
Pt given 20mg  iv lasix with 550 output.  Pt states, "she already feels better. "  Lung sounds less ronchous. Report given to night shift RN.

## 2013-07-12 LAB — BODY FLUID CULTURE

## 2013-07-12 LAB — CBC
HCT: 43.5 % (ref 36.0–46.0)
HEMOGLOBIN: 14.7 g/dL (ref 12.0–15.0)
MCH: 32.4 pg (ref 26.0–34.0)
MCHC: 33.8 g/dL (ref 30.0–36.0)
MCV: 95.8 fL (ref 78.0–100.0)
Platelets: 117 10*3/uL — ABNORMAL LOW (ref 150–400)
RBC: 4.54 MIL/uL (ref 3.87–5.11)
RDW: 13 % (ref 11.5–15.5)
WBC: 8.1 10*3/uL (ref 4.0–10.5)

## 2013-07-12 MED ORDER — CONJ ESTROG-MEDROXYPROGEST ACE 0.45-1.5 MG PO TABS: 1.0000 | ORAL_TABLET | ORAL | Status: DC

## 2013-07-12 MED ORDER — DOCUSATE SODIUM 100 MG PO CAPS: 100.0000 mg | ORAL_CAPSULE | Freq: Every day | ORAL | Status: DC | PRN

## 2013-07-12 MED ORDER — HYDROCHLOROTHIAZIDE 12.5 MG PO CAPS
12.5000 mg | ORAL_CAPSULE | Freq: Every day | ORAL | Status: DC
  Administered 2013-07-12 – 2013-07-16 (×5): 12.5 mg via ORAL
  Filled 2013-07-12 (×5): qty 1

## 2013-07-12 MED ORDER — HYDROCHLOROTHIAZIDE 25 MG PO TABS
12.5000 mg | ORAL_TABLET | Freq: Every day | ORAL | Status: DC
  Filled 2013-07-12: qty 0.5

## 2013-07-12 MED ORDER — DILTIAZEM HCL 60 MG PO TABS
120.0000 mg | ORAL_TABLET | Freq: Every day | ORAL | Status: DC
  Administered 2013-07-12 – 2013-07-16 (×5): 120 mg via ORAL
  Filled 2013-07-12 (×5): qty 2

## 2013-07-12 NOTE — Plan of Care (Signed)
Problem: Phase I Progression Outcomes Goal: Pain controlled with appropriate interventions Outcome: Progressing Pt is reports not having any pain.  Goal: OOB as tolerated unless otherwise ordered Outcome: Progressing Pt OOB to BSC and ambulates.  Goal: Incision/dressings dry and intact Outcome: Progressing [place] dressing has old drainage, but it intact.  Goal: Tubes/drains patent Outcome: Progressing NGT is patent and suctioning.  Goal: Voiding-avoid urinary catheter unless indicated Outcome: Progressing Pt is voiding with no difficulties.

## 2013-07-12 NOTE — Progress Notes (Signed)
3 Days Post-Op  Subjective: Doing better.   Cough a little better.   Pain controlled      Objective: Vital signs in last 24 hours: Temp:  [97.3 F (36.3 C)-97.6 F (36.4 C)] 97.6 F (36.4 C) (02/25 0523) Pulse Rate:  [92-101] 92 (02/25 0523) Resp:  [16-18] 18 (02/25 0523) BP: (159-185)/(74-95) 185/81 mmHg (02/25 0523) SpO2:  [92 %-100 %] 96 % (02/25 0523) Last BM Date: 07/08/13  Afebrile, VSS  Intake/Output from previous day: 02/24 0701 - 02/25 0700 In: 3695.9 [I.V.:3395.9; IV Piggyback:300] Out: 3025 [Urine:2825; Emesis/NG output:200] Intake/Output this shift:    General appearance:  female, no distress. Lungs:  Good breath sounds Abd"  Dressing intact, waffle dressing some drainage.  Less distended.  Ng with min bilious output.   Lab Results:   Recent Labs  07/11/13 0403 07/12/13 0413  WBC 10.4 8.1  HGB 13.9 14.7  HCT 40.7 43.5  PLT 110* 117*    BMET  Recent Labs  07/10/13 0433 07/11/13 0403  NA 137 139  K 3.3* 3.6*  CL 100 104  CO2 26 25  GLUCOSE 176* 142*  BUN 15 17  CREATININE 0.62 0.65  CALCIUM 8.1* 8.1*   PT/INR No results found for this basename: LABPROT, INR,  in the last 72 hours  No results found for this basename: AST, ALT, ALKPHOS, BILITOT, PROT, ALBUMIN,  in the last 168 hours   Lipase  No results found for this basename: lipase     Studies/Results: Dg Chest 2 View  07/10/2013   CLINICAL DATA:  Cough and weakness  EXAM: CHEST  2 VIEW  COMPARISON:  CT ABD/PELV WO CM dated 07/09/2013  FINDINGS: The lungs are reasonably well inflated. Atelectasis at the right lung base posteriorly is suspected. Small amounts of pleural fluid blunts the posterior costophrenic angles bilaterally. The cardiac silhouette is normal in size. The pulmonary vascularity is not engorged. 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 There is an esophagogastric tube present whose tip projects off the inferior margin of the film.  IMPRESSION: There  is bibasilar subsegmental atelectasis as well as small bilateral pleural effusions. There is no evidence of pulmonary edema or alveolar pneumonia.   Electronically Signed   By: David  [place]   On: 07/10/2013 15:45   Dg Abd Portable 1v  07/11/2013   CLINICAL DATA:  Nasogastric tube placement  EXAM: PORTABLE ABDOMEN - 1 VIEW  COMPARISON:  CT stone study 07/10/2011  FINDINGS: Nasogastric tube is satisfactorily positioned, and follows the course of the greater curvature of the stomach and terminates in the body of the stomach. Stomach is decompressed. The visualized bowel gas pattern is nonobstructive. The lower quadrants of the abdomen are not completely included on the image. Negative for free intraperitoneal air. There is focal atelectasis at the left lung base. Right shoulder arthroplasty noted.  IMPRESSION: Satisfactory position of nasogastric tube. Visualized bowel gas pattern nonobstructive.  Left basilar atelectasis.   Electronically Signed   By: Curlene Dolphin M.D.   On: 07/11/2013 09:34    Medications: . diltiazem  120 mg Oral Daily  . enoxaparin (LOVENOX) injection  40 mg Subcutaneous Q24H  . estrogen (conjugated)-medroxyprogesterone  1 tablet Oral QODAY  . guaiFENesin  600 mg Oral BID  . hydrochlorothiazide  12.5 mg Oral Daily  . HYDROmorphone PCA 0.3 mg/mL   Intravenous 6 times per day  . pantoprazole (PROTONIX) IV  40 mg Intravenous BID  . piperacillin-tazobactam (ZOSYN)  IV  3.375 g  Intravenous 3 times per day   . dextrose 5 % and 0.9 % NaCl with KCl 40 mEq/L 75 mL/hr at 07/11/13 1816   Prior to Admission medications   Medication Sig Start Date End Date Taking? Authorizing Provider  diltiazem (CARDIZEM) 120 MG tablet Take 120 mg by mouth daily.   Yes Historical Provider, MD  docusate sodium (COLACE) 100 MG capsule Take 100 mg by mouth daily as needed for mild constipation (constipation).   Yes Historical Provider, MD  estrogen, conjugated,-medroxyprogesterone (PREMPRO) 0.45-1.5 MG per  tablet Take 1 tablet by mouth every other day.   Yes Historical Provider, MD  hydrochlorothiazide (HYDRODIURIL) 12.5 MG tablet Take 12.5 mg by mouth daily.   Yes Historical Provider, MD  naproxen sodium (ANAPROX) 220 MG tablet Take 220 mg by mouth daily.   Yes Historical Provider, MD     Assessment/Plan Perforated jejunal diverticulum. S/p Exploratory Laparotomy/Small bowel resection of proximal jejunum with primary anastomosis.  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 S/p Cataracts Hx tobacco, 30 years, quit at age 26, cough last week. Lovenox DVT ordered   Plan:  IS, ambulate.  D/C NG but cont NPO until return of bowel function.  Restart home BP meds   LOS: 3 days    Hang Ammon C. 2/68/3419

## 2013-07-12 NOTE — Progress Notes (Signed)
Physical Therapy Treatment Patient Details Name: Ellen [name] MRN: [id number] DOB: 1940-11-30 Today's Date: 07/12/2013 Time: 7619-5093 PT Time Calculation (min): 17 min  PT Assessment / Plan / Recommendation  History of Present Illness 73 year old female who presents with one-day history of abdominal pain localizing to the left lower quadrant of the abdomen. Patient has had a severe cough for one week. She had fever 3 days ago. She began having diarrhea. She developed pain in the left lower quadrant of the abdomen. She presented to the emergency department for evaluation. Laboratory studies show a low white blood cell count of 2.9. CT scan abdomen and pelvis shows findings consistent with perforated viscus, probably from a loop of small intestine in the left lower quadrant of the abdomen, with a small abscess.  Pt currently s/p ex lap and has NG tube and PCA.   PT Comments   Pt OOB in recliner.  Amb in hallway limited distance due to c/o weakness and ABD pain.  RA avg 89%.  "It hurts to take a deep breath", replied pt.  Progressing well.   Follow Up Recommendations  No PT follow up     Does the patient have the potential to tolerate intense rehabilitation     Barriers to Discharge        Equipment Recommendations  None recommended by PT    Recommendations for Other Services    Frequency Min 3X/week   Progress towards PT Goals Progress towards PT goals: Progressing toward goals  Plan      Precautions / Restrictions Precautions Precautions: Fall Precaution Comments: NG tube, monitor sats Restrictions Weight Bearing Restrictions: No   Pertinent Vitals/Pain RA avg 89%    Mobility  Bed Mobility General bed mobility comments: Pt OOB in recliner Transfers Overall transfer level: Needs assistance Equipment used: Rolling walker (2 wheeled) Transfers: Sit to/from Stand Sit to Stand: Supervision General transfer comment: with RW, cues for hand  placement Ambulation/Gait Ambulation/Gait assistance: Min guard Ambulation Distance (Feet): 205 Feet Assistive device: Rolling walker (2 wheeled) Gait Pattern/deviations: Step-through pattern;Decreased stride length Gait velocity: decr General Gait Details: increased time, slow gait.  RA avg 89%.  Slightly forward flex posture.  C/O ABD discomfort 5/10.  Declined PCA use.    PT Goals (current goals can now be found in the care plan section)    Visit Information  Last PT Received On: 07/12/13 Assistance Needed: +1 History of Present Illness: 73 year old female who presents with one-day history of abdominal pain localizing to the left lower quadrant of the abdomen. Patient has had a severe cough for one week. She had fever 3 days ago. She began having diarrhea. She developed pain in the left lower quadrant of the abdomen. She presented to the emergency department for evaluation. Laboratory studies show a low white blood cell count of 2.9. CT scan abdomen and pelvis shows findings consistent with perforated viscus, probably from a loop of small intestine in the left lower quadrant of the abdomen, with a small abscess.  Pt currently s/p ex lap and has NG tube and PCA.    Subjective Data      Cognition       Balance     End of Session PT - End of Session Equipment Utilized During Treatment: Gait belt Activity Tolerance: Patient tolerated treatment well Patient left: in chair;with call bell/phone within reach   Rica Koyanagi  PTA [facility]  Acute  Rehab Pager      [phone]

## 2013-07-12 NOTE — Progress Notes (Signed)
MD on call notified of patient BP of 185/81.

## 2013-07-12 NOTE — Evaluation (Signed)
Occupational Therapy Evaluation Patient Details Name: Ellen [name] MRN: [id number] DOB: [birth year] Today's Date: 07/12/2013 Time: 4128-7867 OT Time Calculation (min): 28 min  OT Assessment / Plan / Recommendation History of present illness    Per chart: 73 year old female who presents with one-day history of abdominal pain localizing to the left lower quadrant of the abdomen. Patient has had a severe cough for one week. She had fever 3 days ago. She began having diarrhea. She developed pain in the left lower quadrant of the abdomen. She presented to the emergency department for evaluation. Laboratory studies show a low white blood cell count of 2.9. CT scan abdomen and pelvis shows findings consistent with perforated viscus, probably from a loop of small intestine in the left lower quadrant of the abdomen, with a small abscess.  Pt currently s/p ex lap and has NG tube and PCA.  Pt is s/p exploratory lap for SBO   Clinical Impression   Pt was admitted for the above.  She will benefit from skilled OT to increase safety and independence with adls.  Goals in acute are for supervision to mod I.  Pt was independent prior to admission.      OT Assessment  Patient needs continued OT Services    Follow Up Recommendations  No OT follow up    Barriers to Discharge      Equipment Recommendations  None recommended by OT (likely)    Recommendations for Other Services    Frequency  Min 2X/week    Precautions / Restrictions Precautions Precautions: Fall Precaution Comments: NG tube, monitor sats Restrictions Weight Bearing Restrictions: No   Pertinent Vitals/Pain Abdomen with some pain--used pca    ADL  Grooming: Supervision/safety;Wash/dry hands Where Assessed - Grooming: Supported standing Upper Body Bathing: Set up Where Assessed - Upper Body Bathing: Unsupported sitting Lower Body Bathing: Supervision/safety Where Assessed - Lower Body Bathing: Supported sit to stand Upper Body  Dressing: Minimal assistance (lines) Where Assessed - Upper Body Dressing: Unsupported sitting Lower Body Dressing: Minimal assistance Where Assessed - Lower Body Dressing: Supported sit to Lobbyist: Magazine features editor Method: Sit to Loss adjuster, chartered: Comfort height toilet;Grab bars (has sink next to commode) Toileting - Water quality scientist and Hygiene: Supervision/safety Where Assessed - Best boy and Hygiene: Sit to stand from 3-in-1 or toilet Equipment Used: Rolling walker Transfers/Ambulation Related to ADLs: ambulated to bathroom with RW ADL Comments: Pt has a long sponge and reacher at home:  educated on uses for adls.  She does not have a seat in shower and verbalizes that she will sponge bathe initally until she feels back to baseline.  Husband can assist with IADLs.  He has peripheral neuropathy and uses cane  Began energy conservation education.    OT Diagnosis: Generalized weakness  OT Problem List: Decreased strength;Decreased activity tolerance;Impaired balance (sitting and/or standing);Decreased knowledge of use of DME or AE;Pain OT Treatment Interventions: Self-care/ADL training;Energy conservation;DME and/or AE instruction;Patient/family education;Balance training   OT Goals(Current goals can be found in the care plan section) Acute Rehab OT Goals Patient Stated Goal: get back to being independent OT Goal Formulation: With patient Time For Goal Achievement: 07/19/13 Potential to Achieve Goals: Good ADL Goals Pt Will Perform Lower Body Dressing: with modified independence;sit to/from stand Pt Will Transfer to Toilet: with modified independence;ambulating (high commode with vanity next to it) Pt Will Perform Toileting - Clothing Manipulation and hygiene: with modified independence;sit to/from stand Pt Will Perform Tub/Shower Transfer: with supervision;ambulating;Shower transfer  Additional ADL Goal #1: pt will gather  clothes at supervision level, using reacher as needed  Visit Information  Last OT Received On: 07/12/13 Assistance Needed: +1 History of Present Illness: 73 year old female who presents with one-day history of abdominal pain localizing to the left lower quadrant of the abdomen. Patient has had a severe cough for one week. She had fever 3 days ago. She began having diarrhea. She developed pain in the left lower quadrant of the abdomen. She presented to the emergency department for evaluation. Laboratory studies show a low white blood cell count of 2.9. CT scan abdomen and pelvis shows findings consistent with perforated viscus, probably from a loop of small intestine in the left lower quadrant of the abdomen, with a small abscess.  Pt currently s/p ex lap and has NG tube and PCA.       Prior [place] expects to be discharged to:: Private residence Living Arrangements: Spouse/significant other Type of Home: House Home Access: Stairs to enter [employer] of Steps: 3 Entrance Stairs-Rails: Right Home Layout: One level Home Equipment: [place] - 2 wheels;Cane - single point Prior Function Level of Independence: Independent Communication Communication: No difficulties         Vision/Perception     Cognition  Cognition Arousal/Alertness: Awake/alert Behavior During Therapy: WFL for tasks assessed/performed Overall Cognitive Status: Within Functional Limits for tasks assessed    Extremity/Trunk Assessment Upper Extremity Assessment Upper Extremity Assessment: Overall WFL for tasks assessed (h/o R shoulder replacement--good recovery)     Mobility Bed Mobility Overal bed mobility: Needs Assistance Bed Mobility: Supine to Sit Supine to sit: Min assist General bed mobility comments: bed elevated Transfers Sit to Stand: Supervision General transfer comment: with RW, cues for hand placement     Exercise     Balance     End of  Session OT - End of Session Activity Tolerance: Patient tolerated treatment well Patient left: in chair;with call bell/phone within reach  [place] 07/12/2013, 10:44 AM Lesle Chris, OTR/L [phone] 07/12/2013

## 2013-07-12 NOTE — Progress Notes (Signed)
ANTIBIOTIC CONSULT NOTE - FOLLOW UP  Pharmacy Consult for Zosyn Indication: peritonitis  Allergies  Allergen Reactions  . Sulfa Antibiotics Nausea And Vomiting and Swelling    Labs:  Recent Labs  07/09/13 1624 07/10/13 0433 07/11/13 0403 07/12/13 0413  WBC  --  8.3 10.4 8.1  HGB 16.3* 15.1* 13.9 14.7  PLT  --  121* 110* 117*  CREATININE 0.80 0.62 0.65  --    Estimated Creatinine Clearance: 53.3 ml/min (by C-G formula based on Cr of 0.65). No results found for this basename: VANCOTROUGH, VANCOPEAK, VANCORANDOM, GENTTROUGH, GENTPEAK, GENTRANDOM, TOBRATROUGH, TOBRAPEAK, TOBRARND, AMIKACINPEAK, AMIKACINTROU, AMIKACIN,  in the last 72 hours    Assessment: 73 y/o F presented to ED with abd pain and found to have perforated viscus, probably from loop of small intestine, with a small abscess. Empiric Zosyn started. Underwent exp lap, SB resection of prox jejunum 2/22 with primary anastomosis.  2/22 >> Zosyn >>  Tmax: AF WBCs: Low on admit, now WNL Renal: SCr 0.65, stable, CrCl 53  2/22 peritoneal fluid from operative site >> klebsiella pneumoniae (res only to Ampicillin) 2/22 urine: NGF  Today is D#4 Zosyn for SB perf w/abscess. Stable postop. Culture with sensitive Klebsiella pneumoniae.   Plan:   Continue Zosyn 3.375 gm IV q8h extended infusion over 4 hours   MD, consider narrowing antibiotic per sensitivity panel  Pharmacy will sign off from formal notes writing - will f/u peripherally   Vanessa [place], PharmD, BCPS Pager: [phone] 1:44 PM Pharmacy #: [phone]

## 2013-07-13 NOTE — Progress Notes (Signed)
Occupational Therapy Treatment Patient Details Name: Genelda Roark MRN: [id number] DOB: 02-25-1941 Today's Date: 07/13/2013 Time: 5784-6962 OT Time Calculation (min): 19 min  OT Assessment / Plan / Recommendation  History of present illness 73 year old female who presents with one-day history of abdominal pain localizing to the left lower quadrant of the abdomen. Patient has had a severe cough for one week. She had fever 3 days ago. She began having diarrhea. She developed pain in the left lower quadrant of the abdomen. She presented to the emergency department for evaluation. Laboratory studies show a low white blood cell count of 2.9. CT scan abdomen and pelvis shows findings consistent with perforated viscus, probably from a loop of small intestine in the left lower quadrant of the abdomen, with a small abscess.  Pt currently s/p ex lap and has NG tube and PCA.   OT comments  Pt moving well: did not get up from flat bed however.  Most of adl was completed before OT arrived today.  Pt verbalizes use of reacher for adls and she is used to using reacher for retrieving items.    Follow Up Recommendations  No OT follow up    Barriers to Discharge       Equipment Recommendations  None recommended by OT    Recommendations for Other Services    Frequency Min 2X/week   Progress towards OT Goals Progress towards OT goals: Progressing toward goals  Plan      Precautions / Restrictions Precautions Precautions: Fall Restrictions Weight Bearing Restrictions: No   Pertinent Vitals/Pain 3/10 pain.  Did not need pca. Repositioned in bed    ADL  Equipment Used: Rolling walker Transfers/Ambulation Related to ADLs: ambulated to bathroom:  supervision for sit to stand then mod I.  assist only for IV pole.   ADL Comments: Pt brushed teeth at sink as she had already washed up.  Educated on stepping backwards into shower stall using RW for support but pt does not have a seat so she will likely sponge  bathe initially. Also educated on gathering items with RW using reacher.  Pt states that she will use reacher if needed for LB adls and husband will assist with socks.      OT Diagnosis:    OT Problem List:   OT Treatment Interventions:     OT Goals(current goals can now be found in the care plan section)    Visit Information  Last OT Received On: 07/13/13 Assistance Needed: +1 History of Present Illness: 73 year old female who presents with one-day history of abdominal pain localizing to the left lower quadrant of the abdomen. Patient has had a severe cough for one week. She had fever 3 days ago. She began having diarrhea. She developed pain in the left lower quadrant of the abdomen. She presented to the emergency department for evaluation. Laboratory studies show a low white blood cell count of 2.9. CT scan abdomen and pelvis shows findings consistent with perforated viscus, probably from a loop of small intestine in the left lower quadrant of the abdomen, with a small abscess.  Pt currently s/p ex lap and has NG tube and PCA.    Subjective Data      Prior Functioning       Cognition  Cognition Arousal/Alertness: Awake/alert Behavior During Therapy: WFL for tasks assessed/performed Overall Cognitive Status: Within Functional Limits for tasks assessed    Mobility  Bed Mobility Supine to sit: Supervision;HOB elevated Transfers Transfers: Sit to/from Stand Sit to Stand: Supervision (  cues for hand position)    Exercises      Balance    End of Session OT - End of Session Activity Tolerance: Patient tolerated treatment well Patient left: in chair;with call bell/phone within reach  [place] 07/13/2013, 9:29 AM Lesle Chris, OTR/L [phone] 07/13/2013

## 2013-07-13 NOTE — Progress Notes (Signed)
Physical Therapy Treatment Patient Details Name: Dabney Dever MRN: [id number] DOB: 06-05-1940 Today's Date: 07/13/2013 Time: 1035-1100 PT Time Calculation (min): 25 min  PT Assessment / Plan / Recommendation  History of Present Illness 73 year old female who presents with one-day history of abdominal pain localizing to the left lower quadrant of the abdomen. Patient has had a severe cough for one week. She had fever 3 days ago. She began having diarrhea. She developed pain in the left lower quadrant of the abdomen. She presented to the emergency department for evaluation. Laboratory studies show a low white blood cell count of 2.9. CT scan abdomen and pelvis shows findings consistent with perforated viscus, probably from a loop of small intestine in the left lower quadrant of the abdomen, with a small abscess.  Pt currently s/p ex lap and has NG tube and PCA.   PT Comments   Assisted pt OOb to amb in hallway a great distance then to BR before positioning in recliner.  Pt progressing well.   Follow Up Recommendations  No PT follow up     Does the patient have the potential to tolerate intense rehabilitation     Barriers to Discharge        Equipment Recommendations  None recommended by PT    Recommendations for Other Services    Frequency Min 3X/week   Progress towards PT Goals Progress towards PT goals: Progressing toward goals  Plan      Precautions / Restrictions Precautions Precautions: Fall Restrictions Weight Bearing Restrictions: No   Pertinent Vitals/Pain     Mobility  Bed Mobility Overal bed mobility: Needs Assistance Bed Mobility: Supine to Sit Supine to sit: Supervision;HOB elevated General bed mobility comments: increased time Transfers Overall transfer level: Needs assistance Equipment used: Rolling walker (2 wheeled) Transfers: Sit to/from Stand Sit to Stand: Supervision General transfer comment: with RW, cues for hand  placement Ambulation/Gait Ambulation/Gait assistance: Supervision;Min guard Ambulation Distance (Feet): 225 Feet Assistive device: Rolling walker (2 wheeled) Gait Pattern/deviations: Step-through pattern;Decreased stride length General Gait Details: increased time, slow gait.  RA avg 89%.  Slightly forward flex posture.  C/O ABD discomfort 5/10.  Declined PCA use.     PT Goals (current goals can now be found in the care plan section)    Visit Information  Last PT Received On: 07/13/13 Assistance Needed: +1 History of Present Illness: 73 year old female who presents with one-day history of abdominal pain localizing to the left lower quadrant of the abdomen. Patient has had a severe cough for one week. She had fever 3 days ago. She began having diarrhea. She developed pain in the left lower quadrant of the abdomen. She presented to the emergency department for evaluation. Laboratory studies show a low white blood cell count of 2.9. CT scan abdomen and pelvis shows findings consistent with perforated viscus, probably from a loop of small intestine in the left lower quadrant of the abdomen, with a small abscess.  Pt currently s/p ex lap and has NG tube and PCA.    Subjective Data      Cognition       Balance     End of Session PT - End of Session Equipment Utilized During Treatment: Gait belt Activity Tolerance: Patient tolerated treatment well Patient left: in chair;with call bell/phone within reach   Rica Koyanagi  PTA [facility]  Acute  Rehab Pager      [phone]

## 2013-07-13 NOTE — Progress Notes (Signed)
4 Days Post-Op  Subjective: Feels a bit more distended.   Passing a little flatus.  Cough a little better.   Pain controlled      Objective: Vital signs in last 24 hours: Temp:  [97.5 F (36.4 C)-98 F (36.7 C)] 97.5 F (36.4 C) (02/26 0645) Pulse Rate:  [80-87] 81 (02/26 0645) Resp:  [16-28] 16 (02/26 0738) BP: (134-163)/(80-91) 159/91 mmHg (02/26 0645) SpO2:  [92 %-97 %] 92 % (02/26 0738) FiO2 (%):  [38 %] 38 % (02/25 2000) Last BM Date: 07/08/13  Afebrile, VSS  Intake/Output from previous day: 02/25 0701 - 02/26 0700 In: 2502.5 [P.O.:420; I.V.:1932.5; IV Piggyback:150] Out: 801 [Urine:801] Intake/Output this shift:    General appearance:  female, no distress. Lungs:  Good breath sounds Abd"  Dressing intact, waffle dressing some drainage.  Less distended.    Lab Results:   Recent Labs  07/11/13 0403 07/12/13 0413  WBC 10.4 8.1  HGB 13.9 14.7  HCT 40.7 43.5  PLT 110* 117*    BMET  Recent Labs  07/11/13 0403  NA 139  K 3.6*  CL 104  CO2 25  GLUCOSE 142*  BUN 17  CREATININE 0.65  CALCIUM 8.1*   PT/INR No results found for this basename: LABPROT, INR,  in the last 72 hours  No results found for this basename: AST, ALT, ALKPHOS, BILITOT, PROT, ALBUMIN,  in the last 168 hours   Lipase  No results found for this basename: lipase     Studies/Results: Dg Abd Portable 1v  07/11/2013   CLINICAL DATA:  Nasogastric tube placement  EXAM: PORTABLE ABDOMEN - 1 VIEW  COMPARISON:  CT stone study 07/10/2011  FINDINGS: Nasogastric tube is satisfactorily positioned, and follows the course of the greater curvature of the stomach and terminates in the body of the stomach. Stomach is decompressed. The visualized bowel gas pattern is nonobstructive. The lower quadrants of the abdomen are not completely included on the image. Negative for free intraperitoneal air. There is focal atelectasis at the left lung base. Right shoulder arthroplasty noted.  IMPRESSION:  Satisfactory position of nasogastric tube. Visualized bowel gas pattern nonobstructive.  Left basilar atelectasis.   Electronically Signed   By: Curlene Dolphin M.D.   On: 07/11/2013 09:34    Medications: . diltiazem  120 mg Oral Daily  . enoxaparin (LOVENOX) injection  40 mg Subcutaneous Q24H  . estrogen (conjugated)-medroxyprogesterone  1 tablet Oral QODAY  . guaiFENesin  600 mg Oral BID  . hydrochlorothiazide  12.5 mg Oral Daily  . HYDROmorphone PCA 0.3 mg/mL   Intravenous 6 times per day  . pantoprazole (PROTONIX) IV  40 mg Intravenous BID  . piperacillin-tazobactam (ZOSYN)  IV  3.375 g Intravenous 3 times per day   . dextrose 5 % and 0.9 % NaCl with KCl 40 mEq/L 75 mL/hr at 07/12/13 1404   Prior to Admission medications   Medication Sig Start Date End Date Taking? Authorizing Provider  diltiazem (CARDIZEM) 120 MG tablet Take 120 mg by mouth daily.   Yes Historical Provider, MD  docusate sodium (COLACE) 100 MG capsule Take 100 mg by mouth daily as needed for mild constipation (constipation).   Yes Historical Provider, MD  estrogen, conjugated,-medroxyprogesterone (PREMPRO) 0.45-1.5 MG per tablet Take 1 tablet by mouth every other day.   Yes Historical Provider, MD  hydrochlorothiazide (HYDRODIURIL) 12.5 MG tablet Take 12.5 mg by mouth daily.   Yes Historical Provider, MD  naproxen sodium (ANAPROX) 220 MG tablet Take 220 mg by  mouth daily.   Yes Historical Provider, MD     Assessment/Plan Perforated jejunal diverticulum. S/p Exploratory Laparotomy/Small bowel resection of proximal jejunum with primary anastomosis.  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 S/p Cataracts Hx tobacco, 30 years, quit at age 36, cough last week. Lovenox DVT ordered   Plan:  IS, ambulate.  D/C NG but cont NPO until return of bowel function.  Restart home BP meds   LOS: 4 days    Daley Mooradian  C. 4/97/0263

## 2013-07-14 DIAGNOSIS — I10 Essential (primary) hypertension: Secondary | ICD-10-CM

## 2013-07-14 MED ORDER — DIPHENHYDRAMINE HCL 25 MG PO CAPS
25.0000 mg | ORAL_CAPSULE | Freq: Four times a day (QID) | ORAL | Status: DC | PRN
  Administered 2013-07-14: 25 mg via ORAL
  Filled 2013-07-14: qty 1

## 2013-07-14 MED ORDER — HYDROMORPHONE HCL PF 1 MG/ML IJ SOLN: 1.0000 mg | INTRAMUSCULAR | Status: DC | PRN

## 2013-07-14 MED ORDER — METOPROLOL TARTRATE 1 MG/ML IV SOLN
5.0000 mg | Freq: Four times a day (QID) | INTRAVENOUS | Status: DC | PRN
  Administered 2013-07-15: 5 mg via INTRAVENOUS
  Filled 2013-07-14 (×2): qty 5

## 2013-07-14 MED ORDER — BISACODYL 10 MG RE SUPP: 10.0000 mg | Freq: Once | RECTAL | Status: DC

## 2013-07-14 NOTE — Progress Notes (Signed)
Patient ID: Ellen [name], female   DOB: 11-06-1940, 73 y.o.   MRN: [id number]  Subjective: Tolerating sips of clears.  Episode of nausea this AM.  No vomiting.  Passing flatus.  Walking in hallways.  Still has a cough.  BP is up.    Objective:  Vital signs:  Filed Vitals:   07/13/13 2245 07/14/13 0001 07/14/13 0416 07/14/13 0500  BP: 172/73   183/99  Pulse:    83  Temp:    98 F (36.7 C)  TempSrc:      Resp:  18 18 16   Height:      Weight:      SpO2:  96% 97% 97%    Last BM Date: 07/08/13  Intake/Output   Yesterday:  02/26 0701 - 02/27 0700 In: 1200 [P.O.:240; I.V.:910; IV Piggyback:50] Out: 777 [Urine:777] This shift:    I/O last 3 completed shifts: In: 2782.5 [P.O.:660; I.V.:1922.5; IV Piggyback:200] Out: 789 [Urine:878]    Physical Exam: General: Pt awake/alert/oriented x3 in no acute distress Chest: CTA bilaterally. No chest wall pain w good excursion CV:  Pulses intact.  Regular rhythm MS: Normal AROM mjr joints.  No obvious deformity Abdomen: Soft. Mildly distended. Non tender.  Dressing removed.  Midline incision is without erythema, edges are approximated, staples are intact.   No evidence of peritonitis.  No incarcerated hernias. Ext:  SCDs BLE.  No mjr edema.  No cyanosis Skin: No petechiae / purpura   Problem List:   Principal Problem:   Diverticula of small intestine Active Problems:   Perforated viscus   Abdominal pain   Hypertension    Results:   Labs: No results found for this or any previous visit (from the past 16 hour(s)).  Imaging / Studies: No results found.  Medications / Allergies: per chart  Antibiotics: Anti-infectives   Start     Dose/Rate Route Frequency Ordered Stop   07/10/13 0200  piperacillin-tazobactam (ZOSYN) IVPB 3.375 g     3.375 g 12.5 mL/hr over 240 Minutes Intravenous 3 times per day 07/10/13 0153     07/09/13 1830  piperacillin-tazobactam (ZOSYN) IVPB 3.375 g     3.375 g 100 mL/hr over 30 Minutes  Intravenous  Once 07/09/13 1817 07/09/13 1903      Assessment/Plan  Perforated jejunal diverticulum.  S/p Exploratory Laparotomy/Small bowel resection of proximal jejunum with primary anastomosis.  SURGEON: Earnstine Regal, MD, 07/10/2013  -Continue with clear liquid diet, dive dulcolax suppository.  I suspect she will open up soon -Ambulate -IS  -DC PCA dilaudid.  Will add PO pain medication when she is able to tolerate oral intake -PT/OT-no further follow up -lovenox and SCDs for VTE prophylaxis  Hx of Hypertension -HCTZ, reduce IVF and add metoprolol PRN for SBP >170  Hx tobacco, 30 years, quit at age 66, cough last week, better, mucinex    Erby Pian, [facility] Surgery Pager [phone] Office [phone]  07/14/2013 7:47 AM

## 2013-07-14 NOTE — Progress Notes (Signed)
07/14/13 0545 Nursing  Dr Lucia Gaskins called reg: bp consistently high during the night. Pt takes hctz 12.5 during the day. No orders received.

## 2013-07-14 NOTE — Progress Notes (Signed)
ATTENDING ADDENDUM:  I personally reviewed patient's record, examined the patient, and formulated the following assessment and plan:  Pt had a BM.  Tolerating clears.  Anticipate she will progress to discharge in the next couple days.

## 2013-07-15 MED ORDER — ALBUTEROL SULFATE (2.5 MG/3ML) 0.083% IN NEBU
2.5000 mg | INHALATION_SOLUTION | Freq: Four times a day (QID) | RESPIRATORY_TRACT | Status: DC
  Administered 2013-07-15 (×3): 2.5 mg via RESPIRATORY_TRACT
  Filled 2013-07-15 (×3): qty 3

## 2013-07-15 NOTE — Progress Notes (Signed)
Patient ID: Ellen [name], female   DOB: May 14, 1941, 73 y.o.   MRN: [id number] 6 Days Post-Op  Subjective: Dry cough, no SOB.  No abd pain, hungry, + BM  Objective: Vital signs in last 24 hours: Temp:  [97.2 F (36.2 C)-98.2 F (36.8 C)] 97.2 F (36.2 C) (02/28 0740) Pulse Rate:  [84-93] 84 (02/28 0740) Resp:  [16-18] 16 (02/28 0740) BP: (146-178)/(85-93) 167/92 mmHg (02/28 0740) SpO2:  [92 %-96 %] 96 % (02/28 0740) Last BM Date: 07/14/13  Intake/Output from previous day: 02/27 0701 - 02/28 0700 In: 1426.7 [P.O.:700; I.V.:626.7; IV Piggyback:100] Out: 400 [Urine:400] Intake/Output this shift:    General appearance: alert, cooperative and no distress Resp: wheezes bilaterally and mild, no increased WOB GI: soft, non-tender; bowel sounds normal; no masses,  no organomegaly Incision/Wound: clean and dry  Lab Results:  No results found for this basename: WBC, HGB, HCT, PLT,  in the last 72 hours BMET No results found for this basename: NA, K, CL, CO2, GLUCOSE, BUN, CREATININE, CALCIUM,  in the last 72 hours   Studies/Results: No results found.  Anti-infectives: Anti-infectives   Start     Dose/Rate Route Frequency Ordered Stop   07/10/13 0200  piperacillin-tazobactam (ZOSYN) IVPB 3.375 g     3.375 g 12.5 mL/hr over 240 Minutes Intravenous 3 times per day 07/10/13 0153     07/09/13 1830  piperacillin-tazobactam (ZOSYN) IVPB 3.375 g     3.375 g 100 mL/hr over 30 Minutes Intravenous  Once 07/09/13 1817 07/09/13 1903      Assessment/Plan: s/p Procedure(s): EXPLORATORY LAPAROTOMY SMALL BOWEL RESECTION Doing well, advance to reg diet Dry cough, wheezing.  Add albuterol, on expectorant HTN, home meds restarted and on prn lopressor   LOS: 6 days    Jimie Kuwahara T 07/15/2013

## 2013-07-16 LAB — CBC
HCT: 39.1 % (ref 36.0–46.0)
Hemoglobin: 13.8 g/dL (ref 12.0–15.0)
MCH: 32.2 pg (ref 26.0–34.0)
MCHC: 35.3 g/dL (ref 30.0–36.0)
MCV: 91.4 fL (ref 78.0–100.0)
PLATELETS: 228 10*3/uL (ref 150–400)
RBC: 4.28 MIL/uL (ref 3.87–5.11)
RDW: 12.4 % (ref 11.5–15.5)
WBC: 6.8 10*3/uL (ref 4.0–10.5)

## 2013-07-16 LAB — ANAEROBIC CULTURE

## 2013-07-16 LAB — CREATININE, SERUM
CREATININE: 0.68 mg/dL (ref 0.50–1.10)
GFR calc Af Amer: >90 mL/min (ref >90)
GFR, EST NON AFRICAN AMERICAN: 84 mL/min — AB (ref >90)

## 2013-07-16 MED ORDER — HYDROCODONE-ACETAMINOPHEN 5-325 MG PO TABS: 1.0000 | ORAL_TABLET | ORAL | Status: DC | PRN

## 2013-07-16 MED ORDER — ALBUTEROL SULFATE (2.5 MG/3ML) 0.083% IN NEBU: 2.5000 mg | INHALATION_SOLUTION | Freq: Two times a day (BID) | RESPIRATORY_TRACT | Status: DC

## 2013-07-16 MED ORDER — PANTOPRAZOLE SODIUM 40 MG PO TBEC
40.0000 mg | DELAYED_RELEASE_TABLET | Freq: Two times a day (BID) | ORAL | Status: DC
  Administered 2013-07-16: 40 mg via ORAL
  Filled 2013-07-16: qty 1

## 2013-07-16 NOTE — Discharge Instructions (Signed)
[facility]      [facility] [phone]  OPEN ABDOMINAL SURGERY: POST OP INSTRUCTIONS  Always review your discharge instruction sheet given to you by the facility where your surgery was performed.  IF YOU HAVE DISABILITY OR FAMILY LEAVE FORMS, YOU MUST BRING THEM TO THE OFFICE FOR PROCESSING.  PLEASE DO NOT GIVE THEM TO YOUR DOCTOR.  1. A prescription for pain medication may be given to you upon discharge.  Take your pain medication as prescribed, if needed.  If narcotic pain medicine is not needed, then you may take acetaminophen (Tylenol) or ibuprofen (Advil) as needed. 2. Take your usually prescribed medications unless otherwise directed. 3. If you need a refill on your pain medication, please contact your pharmacy. They will contact our office to request authorization.  Prescriptions will not be filled after 5pm or on week-ends. 4. You should follow a light diet the first few days after arrival home, such as soup and crackers, pudding, etc.unless your doctor has advised otherwise. A high-fiber, low fat diet can be resumed as tolerated.   Be sure to include lots of fluids daily. Most patients will experience some swelling and bruising on the chest and neck area.  Ice packs will help.  Swelling and bruising can take several days to resolve 5. Most patients will experience some swelling and bruising in the area of the incision. Ice pack will help. Swelling and bruising can take several days to resolve..  6. It is common to experience some constipation if taking pain medication after surgery.  Increasing fluid intake and taking a stool softener will usually help or prevent this problem from occurring.  A mild laxative (Milk of Magnesia or Miralax) should be taken according to package directions if there are no bowel movements after 48 hours. 7.  You may have steri-strips (small skin tapes) in place directly over the incision.  These strips should be left on the skin for 7-10 days.  If your  surgeon used skin glue on the incision, you may shower in 24 hours.  The glue will flake off over the next 2-3 weeks.  Any sutures or staples will be removed at the office during your follow-up visit. You may find that a light gauze bandage over your incision may keep your staples from being rubbed or pulled. You may shower and replace the bandage daily. 8. ACTIVITIES:  You may resume regular (light) daily activities beginning the next day--such as daily self-care, walking, climbing stairs--gradually increasing activities as tolerated.  You may have sexual intercourse when it is comfortable.  Refrain from any heavy lifting or straining until approved by your doctor. a. You may drive when you no longer are taking prescription pain medication, you can comfortably wear a seatbelt, and you can safely maneuver your car and apply brakes b. Return to Work: ___________________________________ 9. You should see your doctor in the office for a follow-up appointment approximately two weeks after your surgery.  Make sure that you call for this appointment within a day or two after you arrive home to insure a convenient appointment time. OTHER INSTRUCTIONS:  _____________________________________________________________ _____________________________________________________________  WHEN TO CALL YOUR DOCTOR: 1. Fever over 101.0 2. Inability to urinate 3. Nausea and/or vomiting 4. Extreme swelling or bruising 5. Continued bleeding from incision. 6. Increased pain, redness, or drainage from the incision. 7. Difficulty swallowing or breathing 8. Muscle cramping or spasms. 9. Numbness or tingling in hands or feet or around lips.  The clinic staff is available to   answer your questions during regular business hours.  Please don't hesitate to call and ask to speak to one of the nurses if you have concerns.  For further questions, please visit [email or web address]   

## 2013-07-16 NOTE — Progress Notes (Signed)
PHARMACIST - PHYSICIAN COMMUNICATION CONCERNING:  IV to Oral Route Change Policy  RECOMMENDATION: This patient is receiving Protonix by the intravenous route.  Based on criteria approved by the Pharmacy and Therapeutics Committee, Protonix is being converted to the equivalent oral dose form(s).  DESCRIPTION: These criteria include:  Has a documented ability to take oral medications (tolerating diet of full liquids or better or  Gastric tube feedings for >24 hours OR taking other scheduled oral medications for >24 hours).  Expected plan for continued treatment for at least 1 day.  If you have questions about this conversion, please contact the Pharmacy Department  []   [phone] )  [facility] []   [phone] )  [facility]  []   [phone] )  [facility] [x]   [phone] )  Anna, PharmD, [place] Pager: [phone] 10:23 AM Pharmacy #: [phone]

## 2013-07-16 NOTE — Progress Notes (Signed)
Patient ID: Ellen [name], female   DOB: Sep 25, 1940, 73 y.o.   MRN: [id number] 7 Days Post-Op  Subjective: No C/O this AM.  Cough better.  Tol reg diet without difficulty  Objective: Vital signs in last 24 hours: Temp:  [97.8 F (36.6 C)-98.1 F (36.7 C)] 98 F (36.7 C) (03/01 0515) Pulse Rate:  [89-104] 89 (03/01 0515) Resp:  [16-20] 20 (03/01 0515) BP: (151-159)/(88-96) 159/91 mmHg (03/01 0515) SpO2:  [92 %-96 %] 93 % (03/01 0515) Last BM Date: 07/15/13  Intake/Output from previous day: 02/28 0701 - 03/01 0700 In: 702.3 [P.O.:240; I.V.:312.3; IV Piggyback:150] Out: -  Intake/Output this shift:    General appearance: alert, cooperative and no distress GI: normal findings: soft, non-tender Incision/Wound: Clean and dry  Lab Results:   Recent Labs  07/16/13 0450  WBC 6.8  HGB 13.8  HCT 39.1  PLT 228   BMET  Recent Labs  07/16/13 0450  CREATININE 0.68     Studies/Results: No results found.  Anti-infectives: Anti-infectives   Start     Dose/Rate Route Frequency Ordered Stop   07/10/13 0200  piperacillin-tazobactam (ZOSYN) IVPB 3.375 g     3.375 g 12.5 mL/hr over 240 Minutes Intravenous 3 times per day 07/10/13 0153     07/09/13 1830  piperacillin-tazobactam (ZOSYN) IVPB 3.375 g     3.375 g 100 mL/hr over 30 Minutes Intravenous  Once 07/09/13 1817 07/09/13 1903      Assessment/Plan: s/p Procedure(s): EXPLORATORY LAPAROTOMY SMALL BOWEL RESECTION Doing very well OK for discharge RTO 1 week for staple removal   LOS: 7 days    Rashelle Ireland T 07/16/2013

## 2013-07-17 ENCOUNTER — Telehealth (INDEPENDENT_AMBULATORY_CARE_PROVIDER_SITE_OTHER): Payer: Self-pay

## 2013-07-17 NOTE — Telephone Encounter (Signed)
Pt home doing well. PO appt made. 

## 2013-07-17 NOTE — Progress Notes (Signed)
   CARE MANAGEMENT NOTE 07/17/2013  Patient:  Ellen [name], Ellen [name]   Account Number:  [id number]  Date Initiated:  07/10/2013  Documentation initiated by:  [facility]  Subjective/Objective Assessment:   Exploratory laparotomy, Small bowel resection of proximal jejunum with primary anastomosis     Action/Plan:   HH VS SNF  lives at home with husband   Anticipated DC Date:  07/16/2013   Anticipated DC Plan:  [place]  CM consult      Choice offered to / List presented to:             Status of service:  Completed, signed off Medicare Important Message given?   (If response is "NO", the following Medicare IM given date fields will be blank) Date Medicare IM given:   Date Additional Medicare IM given:    Discharge Disposition:  HOME/SELF CARE  Per UR Regulation:    If discussed at Long Length of Stay Meetings, dates discussed:    Comments:  No NCM needs identified. Ellen Finner RN CCM Case Mgmt phone [phone]

## 2013-07-18 DIAGNOSIS — R05 Cough: Secondary | ICD-10-CM | POA: Diagnosis not present

## 2013-07-18 DIAGNOSIS — I10 Essential (primary) hypertension: Secondary | ICD-10-CM | POA: Diagnosis not present

## 2013-07-18 DIAGNOSIS — N951 Menopausal and female climacteric states: Secondary | ICD-10-CM | POA: Diagnosis not present

## 2013-07-18 DIAGNOSIS — R059 Cough, unspecified: Secondary | ICD-10-CM | POA: Diagnosis not present

## 2013-07-18 NOTE — Discharge Summary (Signed)
  Physician Discharge Summary [facility]  Patient ID: Ellen [name] MRN: [id number] DOB/AGE: Jan 31, 1941 73 y.o.  Admit date: 07/09/2013 Discharge date: 07/16/2013  Admission Diagnoses:  Perforated jejunal diverticulum  Discharge Diagnoses:  Principal Problem:   Diverticulitis of jejunum s/p SB resection 07/10/2013 Active Problems:   Perforated viscus   Abdominal pain   Hypertension   Discharged Condition: good  Hospital Course: patient is a 73 year old female who presents with one-day history of abdominal pain localizing to the left lower quadrant of the abdomen. Patient has had a severe cough for one week. She had fever 3 days ago. She began having diarrhea. She developed pain in the left lower quadrant of the abdomen. She presented to the emergency department for evaluation. Laboratory studies show a low white blood cell count of 2.9. CT scan abdomen and pelvis shows findings consistent with perforated viscus, probably from a loop of small intestine in the left lower quadrant of the abdomen, with a small abscess.  Patient was taken to the operating room urgently where she underwent resection of segment of proximal jejunum.  Post op course largely uneventful.  Gradual resolution of ileus.  Diet advanced.  Prepared for discharge on POD#6.  Consults: None  Significant Diagnostic Studies: none  Treatments: surgery: resection of proximal jejunum with primary anastomosis  Discharge Exam: Blood pressure 165/114, pulse 84, temperature 98 F (36.7 C), temperature source Oral, resp. rate 18, height 5\' 4"  (1.626 m), weight 137 lb 9.1 oz (62.4 kg), SpO2 92.00%. See note by Dr. Excell Seltzer 07/16/2013  Disposition: Home with family  Discharge Orders   Future Appointments Provider Department Dept Phone   07/19/2013 11:10 AM Zenovia Jarred, MD [facility] [place] [phone]   Future Orders Complete By Expires   Discharge patient  As directed        Medication  List         diltiazem 120 MG tablet  Commonly known as:  CARDIZEM  Take 120 mg by mouth daily.     docusate sodium 100 MG capsule  Commonly known as:  COLACE  Take 100 mg by mouth daily as needed for mild constipation (constipation).     estrogen (conjugated)-medroxyprogesterone 0.45-1.5 MG per tablet  Commonly known as:  PREMPRO  Take 1 tablet by mouth every other day.     hydrochlorothiazide 12.5 MG tablet  Commonly known as:  HYDRODIURIL  Take 12.5 mg by mouth daily.     HYDROcodone-acetaminophen 5-325 MG per tablet  Commonly known as:  NORCO/VICODIN  Take 1-2 tablets by mouth every 4 (four) hours as needed.     naproxen sodium 220 MG tablet  Commonly known as:  ANAPROX  Take 220 mg by mouth daily.           Follow-up Information   Follow up with Earnstine Regal, MD. Schedule an appointment as soon as possible for a visit in 1 week.   Specialty:  General Surgery   Contact information:   [address] [place] [address] [phone]       Earnstine Regal, MD, [facility] Office: [phone]   Signed: Earnstine Regal 07/18/2013, 11:22 AM

## 2013-07-19 ENCOUNTER — Encounter (INDEPENDENT_AMBULATORY_CARE_PROVIDER_SITE_OTHER): Payer: Self-pay | Admitting: General Surgery

## 2013-07-19 ENCOUNTER — Ambulatory Visit (INDEPENDENT_AMBULATORY_CARE_PROVIDER_SITE_OTHER): Payer: Medicare Other | Admitting: General Surgery

## 2013-07-19 VITALS — BP 150/78 | HR 78 | Temp 98.0°F | Resp 20 | Ht 64.0 in | Wt 125.0 lb

## 2013-07-19 DIAGNOSIS — K5712 Diverticulitis of small intestine without perforation or abscess without bleeding: Secondary | ICD-10-CM

## 2013-07-19 NOTE — Progress Notes (Signed)
Subjective:     Patient ID: Ellen [name], female   DOB: 1941/01/21, 73 y.o.   MRN: [id number]  HPI Patient presents status post porta laparotomy and small bowel resection for perforated jejunal diverticulitis done by Dr. Delana Meyer. She is doing well. She is eating and moving her bowels with approximately 2 bowel movements daily. No wound complaints. Occasional Tylenol for pain.  Review of Systems     Objective:   Physical Exam Abdomen soft and nontender. Midline incision is well-healed without signs of infection. Staples were removed and Steri-Strips with benzoin were placed.    Assessment:     Doing very well status post small bowel resection for perforated jejunal diverticulitis. Disease process was discussed.    Plan:     Avoid lifting over 10 pounds for a total of 6 weeks after surgery. Wound care instructions were given. Return as needed.

## 2013-07-19 NOTE — Patient Instructions (Signed)
No lifting greater than 10 pounds for 6 weeks after surgery.

## 2013-10-06 DIAGNOSIS — M7989 Other specified soft tissue disorders: Secondary | ICD-10-CM | POA: Diagnosis not present

## 2013-11-15 DIAGNOSIS — L98499 Non-pressure chronic ulcer of skin of other sites with unspecified severity: Secondary | ICD-10-CM | POA: Diagnosis not present

## 2013-12-13 DIAGNOSIS — H35369 Drusen (degenerative) of macula, unspecified eye: Secondary | ICD-10-CM | POA: Diagnosis not present

## 2014-02-21 DIAGNOSIS — Z0000 Encounter for general adult medical examination without abnormal findings: Secondary | ICD-10-CM | POA: Diagnosis not present

## 2014-02-21 DIAGNOSIS — I10 Essential (primary) hypertension: Secondary | ICD-10-CM | POA: Diagnosis not present

## 2014-02-21 DIAGNOSIS — Z1389 Encounter for screening for other disorder: Secondary | ICD-10-CM | POA: Diagnosis not present

## 2014-02-21 DIAGNOSIS — I350 Nonrheumatic aortic (valve) stenosis: Secondary | ICD-10-CM | POA: Diagnosis not present

## 2014-02-22 ENCOUNTER — Other Ambulatory Visit (HOSPITAL_COMMUNITY): Payer: Self-pay | Admitting: Family Medicine

## 2014-02-22 DIAGNOSIS — Z1231 Encounter for screening mammogram for malignant neoplasm of breast: Secondary | ICD-10-CM

## 2014-02-28 ENCOUNTER — Ambulatory Visit (HOSPITAL_COMMUNITY)
Admission: RE | Admit: 2014-02-28 | Discharge: 2014-02-28 | Disposition: A | Discharge status: Home / Self Care | Payer: Medicare Other | Source: Ambulatory Visit | Attending: Family Medicine | Admitting: Family Medicine

## 2014-02-28 DIAGNOSIS — Z1231 Encounter for screening mammogram for malignant neoplasm of breast: Secondary | ICD-10-CM | POA: Insufficient documentation

## 2014-03-10 ENCOUNTER — Emergency Department (HOSPITAL_COMMUNITY): Payer: Medicare Other

## 2014-03-10 ENCOUNTER — Emergency Department (HOSPITAL_COMMUNITY)
Admission: EM | Admit: 2014-03-10 | Discharge: 2014-03-10 | Disposition: A | Discharge status: Home / Self Care | Payer: Medicare Other | Attending: Emergency Medicine | Admitting: Emergency Medicine

## 2014-03-10 DIAGNOSIS — W500XXA Accidental hit or strike by another person, initial encounter: Secondary | ICD-10-CM | POA: Insufficient documentation

## 2014-03-10 DIAGNOSIS — W19XXXA Unspecified fall, initial encounter: Secondary | ICD-10-CM

## 2014-03-10 DIAGNOSIS — M25519 Pain in unspecified shoulder: Secondary | ICD-10-CM | POA: Diagnosis not present

## 2014-03-10 DIAGNOSIS — S42022A Displaced fracture of shaft of left clavicle, initial encounter for closed fracture: Secondary | ICD-10-CM | POA: Diagnosis not present

## 2014-03-10 DIAGNOSIS — Y9389 Activity, other specified: Secondary | ICD-10-CM | POA: Diagnosis not present

## 2014-03-10 DIAGNOSIS — T148 Other injury of unspecified body region: Secondary | ICD-10-CM | POA: Diagnosis not present

## 2014-03-10 DIAGNOSIS — Y92014 Private driveway to single-family (private) house as the place of occurrence of the external cause: Secondary | ICD-10-CM | POA: Diagnosis not present

## 2014-03-10 DIAGNOSIS — I10 Essential (primary) hypertension: Secondary | ICD-10-CM | POA: Insufficient documentation

## 2014-03-10 DIAGNOSIS — Z87442 Personal history of urinary calculi: Secondary | ICD-10-CM | POA: Diagnosis not present

## 2014-03-10 DIAGNOSIS — Z79899 Other long term (current) drug therapy: Secondary | ICD-10-CM | POA: Diagnosis not present

## 2014-03-10 DIAGNOSIS — S4992XA Unspecified injury of left shoulder and upper arm, initial encounter: Secondary | ICD-10-CM | POA: Diagnosis present

## 2014-03-10 DIAGNOSIS — W01198A Fall on same level from slipping, tripping and stumbling with subsequent striking against other object, initial encounter: Secondary | ICD-10-CM | POA: Diagnosis not present

## 2014-03-10 DIAGNOSIS — S42002A Fracture of unspecified part of left clavicle, initial encounter for closed fracture: Secondary | ICD-10-CM | POA: Diagnosis not present

## 2014-03-10 DIAGNOSIS — Z87891 Personal history of nicotine dependence: Secondary | ICD-10-CM | POA: Diagnosis not present

## 2014-03-10 MED ORDER — ACETAMINOPHEN-CODEINE 120-12 MG/5ML PO SOLN: 10.0000 mL | ORAL | Status: DC | PRN

## 2014-03-10 NOTE — ED Notes (Signed)
Bed: YB01 Expected date: 03/10/14 Expected time: 5:01 PM Means of arrival: Ambulance Comments: Shoulder injury s/p fall

## 2014-03-10 NOTE — Discharge Instructions (Signed)
Clavicle Fracture °A clavicle fracture is a broken collarbone. The collarbone is the long bone that connects your shoulder to your rib cage. A broken collarbone may be treated with a sling, a wrap, or surgery. Treatment depends on whether the broken ends of the bone are out of place or not. °HOME CARE °· Put ice on the injured area: °¨ Put ice in a plastic bag. °¨ Place a towel between your skin and the bag. °¨ Leave the ice on for 20 minutes, 2-3 times a day. °· If you have a wrap or splint: °¨ Wear it all the time, and remove it only to take a bath or shower. °¨ When you bathe or shower, keep your shoulder in the same place as when the sling or wrap is on. °¨ Do not lift your arm. °· If you have a wrap: °¨ Another person must tighten it every day. °¨ It should be tight enough to hold your shoulders back. °¨ Make sure you have enough room to put your pointer finger between your body and the strap. °¨ Loosen the wrap right away if you cannot feel your arm or your hands tingle. °· Only take medicines as told by your doctor. °· Avoid activities that make the injury or pain worse for 4-6 weeks after surgery. °· Keep all follow-up appointments. °GET HELP IF: °· Your medicine is not making you feel less pain. °· Your medicine is not making swelling better. °GET HELP RIGHT AWAY IF:  °· Your cannot feel your arm. °· Your arm is cold. °· Your arm is a lighter color than normal. °MAKE SURE YOU:  °· Understand these instructions. °· Will watch your condition. °· Will get help right away if you are not doing well or get worse. °Document Released: 10/21/2007 Document Revised: 05/09/2013 Document Reviewed: 02/19/2009 °Exit[employer]® Patient Information ©2015 [employer]. This information is not intended to replace advice given to you by your health care provider. Make sure you discuss any questions you have with your health care provider. ° °

## 2014-03-10 NOTE — ED Notes (Signed)
Per EMS pt comes from home after having a fall on left arm while her and her husband were working in the driveway.  Pt has deformity to left clavicle.

## 2014-03-10 NOTE — ED Provider Notes (Signed)
CSN: [id number]     Arrival date & time 03/10/14  1707 History   First MD Initiated Contact with Patient 03/10/14 1712     Chief Complaint  Patient presents with  . Fall  . Shoulder Injury     (Consider location/radiation/quality/duration/timing/severity/associated sxs/prior Treatment) HPI Comments: Patient is a 73 year old female with history of hypertension and kidney stones who presents to emergency department after a fall. She reports that she was outside working on her driveway when her husband fell forward. He hit her in the back and she fell directly onto her left shoulder. She did not hit her head or lose consciousness. The pain is worse with movement. She has not taken anything for her pain. She denies any other injuries at the time of the fall. She denies any numbness or weakness, dizziness, lightheadedness, headache. Patient is right-hand dominant.   The history is provided by the patient. No language interpreter was used.    Past Medical History  Diagnosis Date  . Kidney stones    Past Surgical History  Procedure Laterality Date  . Laparotomy N/A 07/09/2013    Procedure: EXPLORATORY LAPAROTOMY;  Surgeon: Earnstine Regal, MD;  Location: [facility];  Service: General;  Laterality: N/A;  . Bowel resection  07/09/2013    Procedure: SMALL BOWEL RESECTION;  Surgeon: Earnstine Regal, MD;  Location: [facility];  Service: General;;   No family history on file. History  Substance Use Topics  . Smoking status: Former Research scientist (life sciences)  . Smokeless tobacco: Not on file  . Alcohol Use: No   OB History   Grav Para Term Preterm Abortions TAB SAB Ect Mult Living                 Review of Systems  Constitutional: Negative for fever and chills.  Respiratory: Negative for shortness of breath.   Cardiovascular: Negative for chest pain.  Gastrointestinal: Negative for nausea, vomiting and abdominal pain.  Musculoskeletal: Positive for arthralgias and myalgias.  Neurological: Negative for dizziness,  weakness, light-headedness and numbness.  All other systems reviewed and are negative.     Allergies  Lactose intolerance (gi) and Sulfa antibiotics  Home Medications   Prior to Admission medications   Medication Sig Start Date End Date Taking? Authorizing Provider  diltiazem (CARDIZEM) 120 MG tablet Take 120 mg by mouth daily.    Historical Provider, MD  estrogen, conjugated,-medroxyprogesterone (PREMPRO) 0.45-1.5 MG per tablet Take 1 tablet by mouth every other day.    Historical Provider, MD  hydrochlorothiazide (HYDRODIURIL) 12.5 MG tablet Take 12.5 mg by mouth daily.    Historical Provider, MD   BP 179/82  Pulse 78  Temp(Src) 97.7 F (36.5 C) (Oral)  Resp 16  SpO2 93% Physical Exam  Nursing note and vitals reviewed. Constitutional: She is oriented to person, place, and time. She appears well-developed and well-nourished. She does not appear ill. No distress.  HENT:  Head: Normocephalic and atraumatic.  Right Ear: External ear normal.  Left Ear: External ear normal.  Nose: Nose normal.  Mouth/Throat: Oropharynx is clear and moist.  Eyes: Conjunctivae are normal.  Neck: Normal range of motion.  Cardiovascular: Normal rate, regular rhythm, normal heart sounds, intact distal pulses and normal pulses.   Pulses:      Radial pulses are 2+ on the right side, and 2+ on the left side.  Pulmonary/Chest: Effort normal and breath sounds normal. No stridor. No respiratory distress. She has no wheezes. She has no rales.  Abdominal: Soft. She exhibits  no distension.  Musculoskeletal: Normal range of motion.  Tender to palpation to the left clavicle, deformity to clavicle Sensation intact throughout left arm Grip strength 5 out of 5 bilaterally  Neurological: She is alert and oriented to person, place, and time. She has normal strength.  Skin: Skin is warm and dry. She is not diaphoretic. No erythema.  Psychiatric: She has a normal mood and affect. Her behavior is normal.    ED  Course  Procedures (including critical care time) Labs Review Labs Reviewed - No data to display  Imaging Review Dg Clavicle Left  03/10/2014   CLINICAL DATA:  [place] today on driveway at patient's house fell striking LEFT clavicular region, heard a crack  EXAM: LEFT CLAVICLE - 2+ VIEWS  COMPARISON:  None  FINDINGS: Osseous demineralization.  Sternoclavicular and acromioclavicular joint alignments normal.  Lung oblique middle third of LEFT clavicle displaced cranially.  No additional fracture or dislocation identified.  IMPRESSION: Oblique displaced fracture middle third LEFT clavicle.   Electronically Signed   By: Lavonia Dana M.D.   On: 03/10/2014 18:22   Dg Shoulder Left  03/10/2014   CLINICAL DATA:  73 year old female with history of trauma from a fall after losing her balance, complaining of pain in the left shoulder.  EXAM: LEFT SHOULDER - 2+ VIEW  COMPARISON:  No priors.  FINDINGS: There is an acute comminuted fracture of the distal third of the left clavicle, with some superior and posterior displacement of the distal fracture fragment. The scapula and proximal humerus appear intact, and the humeral head is located. The [facility] joint appears intact.  IMPRESSION: 1. Acute displaced mildly comminuted fracture of the distal third of the left clavicle, as above.   Electronically Signed   By: Vinnie Langton M.D.   On: 03/10/2014 18:23     EKG Interpretation None      MDM   Final diagnoses:  Fall  Clavicle fracture, left, closed, initial encounter    Patient presents to emergency department after mechanical fall. Patient fell directly on left shoulder. No other injuries. X-ray shows oblique displaced fracture of the middle third of the left clavicle. Patient is neurovascularly intact and compartments are soft. There is no skin tenting. Discussed case with Dr. Alvan Dame, on call for [facility] orthopedics. He recommends follow-up with Dr. Veverly Fells in the office. Will give sling. Discussed reasons to  return to ED immediately. Vital signs stable for discharge. Patient / Family / Caregiver informed of clinical course, understand medical decision-making process, and agree with plan.   Elwyn Lade, PA-C 03/11/14 [phone]

## 2014-03-11 NOTE — ED Provider Notes (Signed)
Medical screening examination/treatment/procedure(s) were conducted as a shared visit with non-physician practitioner(s) and myself.  I personally evaluated the patient during the encounter.   EKG Interpretation None       Pt with a [place] injury, and resultant displaced clav fx, L side. Neurovascularly intact. Will apply sling. D/C with pain control. No other gross injury, agree, no CT head needed.  Varney Biles, MD 03/11/14 [id number]

## 2014-03-12 DIAGNOSIS — S42022A Displaced fracture of shaft of left clavicle, initial encounter for closed fracture: Secondary | ICD-10-CM | POA: Diagnosis not present

## 2014-03-27 DIAGNOSIS — S42022D Displaced fracture of shaft of left clavicle, subsequent encounter for fracture with routine healing: Secondary | ICD-10-CM | POA: Diagnosis not present

## 2014-04-03 DIAGNOSIS — M25512 Pain in left shoulder: Secondary | ICD-10-CM | POA: Diagnosis not present

## 2014-04-10 DIAGNOSIS — S43409D Unspecified sprain of unspecified shoulder joint, subsequent encounter: Secondary | ICD-10-CM | POA: Diagnosis not present

## 2014-05-22 DIAGNOSIS — S42022D Displaced fracture of shaft of left clavicle, subsequent encounter for fracture with routine healing: Secondary | ICD-10-CM | POA: Diagnosis not present

## 2014-08-20 DIAGNOSIS — H35363 Drusen (degenerative) of macula, bilateral: Secondary | ICD-10-CM | POA: Diagnosis not present

## 2014-08-29 DIAGNOSIS — I10 Essential (primary) hypertension: Secondary | ICD-10-CM | POA: Diagnosis not present

## 2014-08-29 DIAGNOSIS — M6208 Separation of muscle (nontraumatic), other site: Secondary | ICD-10-CM | POA: Diagnosis not present

## 2014-09-07 ENCOUNTER — Ambulatory Visit (HOSPITAL_COMMUNITY): Payer: Medicare Other | Attending: Family | Admitting: Radiology

## 2014-09-07 ENCOUNTER — Other Ambulatory Visit (HOSPITAL_COMMUNITY): Payer: Self-pay | Admitting: Physician Assistant

## 2014-09-07 DIAGNOSIS — Z87891 Personal history of nicotine dependence: Secondary | ICD-10-CM | POA: Insufficient documentation

## 2014-09-07 DIAGNOSIS — I359 Nonrheumatic aortic valve disorder, unspecified: Secondary | ICD-10-CM

## 2014-09-07 DIAGNOSIS — I10 Essential (primary) hypertension: Secondary | ICD-10-CM | POA: Diagnosis not present

## 2014-09-07 DIAGNOSIS — Z8249 Family history of ischemic heart disease and other diseases of the circulatory system: Secondary | ICD-10-CM | POA: Insufficient documentation

## 2014-09-07 NOTE — Progress Notes (Signed)
Echocardiogram performed.  

## 2014-09-19 DIAGNOSIS — Z780 Asymptomatic menopausal state: Secondary | ICD-10-CM | POA: Diagnosis not present

## 2014-09-19 DIAGNOSIS — Z0000 Encounter for general adult medical examination without abnormal findings: Secondary | ICD-10-CM | POA: Diagnosis not present

## 2014-11-06 DIAGNOSIS — Z780 Asymptomatic menopausal state: Secondary | ICD-10-CM | POA: Diagnosis not present

## 2015-01-09 DIAGNOSIS — R109 Unspecified abdominal pain: Secondary | ICD-10-CM | POA: Diagnosis not present

## 2015-01-09 DIAGNOSIS — R1902 Left upper quadrant abdominal swelling, mass and lump: Secondary | ICD-10-CM | POA: Diagnosis not present

## 2015-01-09 DIAGNOSIS — I10 Essential (primary) hypertension: Secondary | ICD-10-CM | POA: Diagnosis not present

## 2015-01-10 ENCOUNTER — Emergency Department (HOSPITAL_COMMUNITY)
Admission: EM | Admit: 2015-01-10 | Discharge: 2015-01-10 | Disposition: A | Discharge status: Home / Self Care | Payer: Medicare Other | Attending: Emergency Medicine | Admitting: Emergency Medicine

## 2015-01-10 ENCOUNTER — Encounter (HOSPITAL_COMMUNITY): Payer: Self-pay | Admitting: Emergency Medicine

## 2015-01-10 ENCOUNTER — Emergency Department (HOSPITAL_COMMUNITY): Payer: Medicare Other

## 2015-01-10 DIAGNOSIS — K5730 Diverticulosis of large intestine without perforation or abscess without bleeding: Secondary | ICD-10-CM | POA: Diagnosis not present

## 2015-01-10 DIAGNOSIS — K5712 Diverticulitis of small intestine without perforation or abscess without bleeding: Secondary | ICD-10-CM | POA: Diagnosis not present

## 2015-01-10 DIAGNOSIS — Z87442 Personal history of urinary calculi: Secondary | ICD-10-CM | POA: Diagnosis not present

## 2015-01-10 DIAGNOSIS — Z87891 Personal history of nicotine dependence: Secondary | ICD-10-CM | POA: Diagnosis not present

## 2015-01-10 DIAGNOSIS — R109 Unspecified abdominal pain: Secondary | ICD-10-CM | POA: Diagnosis present

## 2015-01-10 DIAGNOSIS — K5710 Diverticulosis of small intestine without perforation or abscess without bleeding: Secondary | ICD-10-CM | POA: Diagnosis not present

## 2015-01-10 DIAGNOSIS — I10 Essential (primary) hypertension: Secondary | ICD-10-CM | POA: Diagnosis not present

## 2015-01-10 DIAGNOSIS — K6389 Other specified diseases of intestine: Secondary | ICD-10-CM | POA: Diagnosis not present

## 2015-01-10 DIAGNOSIS — Z79899 Other long term (current) drug therapy: Secondary | ICD-10-CM | POA: Diagnosis not present

## 2015-01-10 HISTORY — DX: Essential (primary) hypertension: I10

## 2015-01-10 LAB — COMPREHENSIVE METABOLIC PANEL
ALK PHOS: 89 U/L (ref 38–126)
ALT: 20 U/L (ref 14–54)
ANION GAP: 7 (ref 5–15)
AST: 22 U/L (ref 15–41)
Albumin: 3.8 g/dL (ref 3.5–5.0)
BILIRUBIN TOTAL: 0.8 mg/dL (ref 0.3–1.2)
BUN: 18 mg/dL (ref 6–20)
CALCIUM: 9.2 mg/dL (ref 8.9–10.3)
CO2: 28 mmol/L (ref 22–32)
CREATININE: 0.62 mg/dL (ref 0.44–1.00)
Chloride: 104 mmol/L (ref 101–111)
Glucose, Bld: 133 mg/dL — ABNORMAL HIGH (ref 65–99)
Potassium: 3.6 mmol/L (ref 3.5–5.1)
SODIUM: 139 mmol/L (ref 135–145)
TOTAL PROTEIN: 7 g/dL (ref 6.5–8.1)

## 2015-01-10 LAB — URINALYSIS, ROUTINE W REFLEX MICROSCOPIC
Bilirubin Urine: NEGATIVE
GLUCOSE, UA: NEGATIVE mg/dL
Hgb urine dipstick: NEGATIVE
KETONES UR: NEGATIVE mg/dL
Nitrite: NEGATIVE
PROTEIN: NEGATIVE mg/dL
Specific Gravity, Urine: 1.02 (ref 1.005–1.030)
UROBILINOGEN UA: 1 mg/dL (ref 0.0–1.0)
pH: 7 (ref 5.0–8.0)

## 2015-01-10 LAB — CBC
HCT: 45 % (ref 36.0–46.0)
HEMOGLOBIN: 15.3 g/dL — AB (ref 12.0–15.0)
MCH: 32.7 pg (ref 26.0–34.0)
MCHC: 34 g/dL (ref 30.0–36.0)
MCV: 96.2 fL (ref 78.0–100.0)
PLATELETS: 263 10*3/uL (ref 150–400)
RBC: 4.68 MIL/uL (ref 3.87–5.11)
RDW: 12.7 % (ref 11.5–15.5)
WBC: 10.8 10*3/uL — AB (ref 4.0–10.5)

## 2015-01-10 LAB — URINE MICROSCOPIC-ADD ON

## 2015-01-10 LAB — LIPASE, BLOOD: Lipase: 21 U/L — ABNORMAL LOW (ref 22–51)

## 2015-01-10 MED ORDER — METRONIDAZOLE IN NACL 5-0.79 MG/ML-% IV SOLN
500.0000 mg | Freq: Once | INTRAVENOUS | Status: AC
  Administered 2015-01-10: 500 mg via INTRAVENOUS
  Filled 2015-01-10: qty 100

## 2015-01-10 MED ORDER — FENTANYL CITRATE (PF) 100 MCG/2ML IJ SOLN
25.0000 ug | Freq: Once | INTRAMUSCULAR | Status: AC
  Administered 2015-01-10: 25 ug via INTRAVENOUS
  Filled 2015-01-10: qty 2

## 2015-01-10 MED ORDER — ONDANSETRON HCL 4 MG PO TABS: 4.0000 mg | ORAL_TABLET | Freq: Three times a day (TID) | ORAL | Status: DC | PRN

## 2015-01-10 MED ORDER — SODIUM CHLORIDE 0.9 % IV BOLUS (SEPSIS)
1000.0000 mL | Freq: Once | INTRAVENOUS | Status: AC
  Administered 2015-01-10: 1000 mL via INTRAVENOUS

## 2015-01-10 MED ORDER — IOHEXOL 300 MG/ML  SOLN
50.0000 mL | Freq: Once | INTRAMUSCULAR | Status: AC | PRN
  Administered 2015-01-10: 50 mL via ORAL

## 2015-01-10 MED ORDER — CIPROFLOXACIN HCL 500 MG PO TABS: 500.0000 mg | ORAL_TABLET | Freq: Two times a day (BID) | ORAL | Status: DC

## 2015-01-10 MED ORDER — IOHEXOL 300 MG/ML  SOLN
100.0000 mL | Freq: Once | INTRAMUSCULAR | Status: AC | PRN
  Administered 2015-01-10: 100 mL via INTRAVENOUS

## 2015-01-10 MED ORDER — ONDANSETRON HCL 4 MG/2ML IJ SOLN
4.0000 mg | Freq: Once | INTRAMUSCULAR | Status: AC
  Administered 2015-01-10: 4 mg via INTRAVENOUS
  Filled 2015-01-10: qty 2

## 2015-01-10 MED ORDER — CIPROFLOXACIN IN D5W 400 MG/200ML IV SOLN
400.0000 mg | Freq: Once | INTRAVENOUS | Status: AC
  Administered 2015-01-10: 400 mg via INTRAVENOUS
  Filled 2015-01-10: qty 200

## 2015-01-10 MED ORDER — METRONIDAZOLE 500 MG PO TABS: 500.0000 mg | ORAL_TABLET | Freq: Two times a day (BID) | ORAL | Status: DC

## 2015-01-10 NOTE — ED Provider Notes (Signed)
CSN: [id number]     Arrival date & time 01/10/15  0328 History   First MD Initiated Contact with Patient 01/10/15 [phone]   Chief Complaint  Patient presents with  . Abdominal Pain     (Consider location/radiation/quality/duration/timing/severity/associated sxs/prior Treatment) HPI  Patient reports he year ago she had a perforation of her small intestines and Dr. Harlow Asa had to remove 2-3 feet of her small bowel. She states about a week ago she started getting left sided abdominal pain that is constant but waxes and wanes. She states it seems worse when she lays down and it seems to get better if she stands up. She states the past 2 days the pain seems to get worse when she eats. She was seen by her PCP yesterday and was advised to stay on clear liquids. She denies nausea, vomiting, or diarrhea. She states she had 2 small bowel movements yesterday and is passing a small amount of gas. She states she feels like her abdomen is bloated and swollen and she states it looks swollen. She denies dysuria or frequency. She states she has had a low-grade temperature of 99.   PCP Dr Betty Martinique with [facility] at [facility]  Past Medical History  Diagnosis Date  . Kidney stones   . Hypertension    Past Surgical History  Procedure Laterality Date  . Laparotomy N/A 07/09/2013    Procedure: EXPLORATORY LAPAROTOMY;  Surgeon: Earnstine Regal, MD;  Location: [facility];  Service: General;  Laterality: N/A;  . Bowel resection  07/09/2013    Procedure: SMALL BOWEL RESECTION;  Surgeon: Earnstine Regal, MD;  Location: [facility];  Service: General;;   Family History  Problem Relation Age of Onset  . Diabetes Mother   . CAD Father    Social History  Substance Use Topics  . Smoking status: Former Research scientist (life sciences)  . Smokeless tobacco: None  . Alcohol Use: Yes     Comment: wine   Lives at home Lives with spouse Drinks 1 glass of wine daily Quit smoking 3 years ago  OB History    No data available     Review of Systems  All  other systems reviewed and are negative.     Allergies  Lactose intolerance (gi) and Sulfa antibiotics  Home Medications   Prior to Admission medications   Medication Sig Start Date End Date Taking? Authorizing Provider  diltiazem (CARDIZEM CD) 120 MG 24 hr capsule Take 120 mg by mouth daily.   Yes Historical Provider, MD  estrogen, conjugated,-medroxyprogesterone (PREMPRO) 0.45-1.5 MG per tablet Take 1 tablet by mouth continuous. Every third day   Yes Historical Provider, MD  hydrochlorothiazide (HYDRODIURIL) 12.5 MG tablet Take 12.5 mg by mouth daily.   Yes Historical Provider, MD  naproxen sodium (ANAPROX) 220 MG tablet Take 220 mg by mouth daily.   Yes Historical Provider, MD  Omega-3 Fatty Acids (FISH OIL) 500 MG CAPS Take 1 capsule by mouth every other day.   Yes Historical Provider, MD  acetaminophen-codeine 120-12 MG/5ML solution Take 10 mLs by mouth every 4 (four) hours as needed for moderate pain. Patient not taking: Reported on 01/10/2015 03/10/14   Cleatrice Burke, PA-C  ciprofloxacin (CIPRO) 500 MG tablet Take 1 tablet (500 mg total) by mouth 2 (two) times daily. 01/10/15   Rolland Porter, MD  metroNIDAZOLE (FLAGYL) 500 MG tablet Take 1 tablet (500 mg total) by mouth 2 (two) times daily. 01/10/15   Rolland Porter, MD  ondansetron (ZOFRAN) 4 MG tablet Take  1 tablet (4 mg total) by mouth every 8 (eight) hours as needed for nausea or vomiting. 01/10/15   Rolland Porter, MD   BP 151/68 mmHg  Pulse 74  Temp(Src) 97.7 F (36.5 C) (Oral)  Resp 20  SpO2 97%  Vital signs normal   Physical Exam  Constitutional: She is oriented to person, place, and time. She appears well-developed and well-nourished.  Non-toxic appearance. She does not appear ill. No distress.  HENT:  Head: Normocephalic and atraumatic.  Right Ear: External ear normal.  Left Ear: External ear normal.  Nose: Nose normal. No mucosal edema or rhinorrhea.  Mouth/Throat: Oropharynx is clear and moist and mucous membranes are normal.  No dental abscesses or uvula swelling.  Eyes: Conjunctivae and EOM are normal. Pupils are equal, round, and reactive to light.  Neck: Normal range of motion and full passive range of motion without pain. Neck supple.  Cardiovascular: Normal rate, regular rhythm and normal heart sounds.  Exam reveals no gallop and no friction rub.   No murmur heard. Pulmonary/Chest: Effort normal and breath sounds normal. No respiratory distress. She has no wheezes. She has no rhonchi. She has no rales. She exhibits no tenderness and no crepitus.  Abdominal: Soft. Normal appearance and bowel sounds are normal. She exhibits distension. There is no tenderness. There is no rebound and no guarding.  Patient has diffuse tenderness of her abdomen with normal bowel sounds. She her abdomen does appear to be distended but is soft.  Musculoskeletal: Normal range of motion. She exhibits no edema or tenderness.  Moves all extremities well.   Neurological: She is alert and oriented to person, place, and time. She has normal strength. No cranial nerve deficit.  Skin: Skin is warm, dry and intact. No rash noted. No erythema. No pallor.  Psychiatric: She has a normal mood and affect. Her speech is normal and behavior is normal. Her mood appears not anxious.  Nursing note and vitals reviewed.   ED Course  Procedures (including critical care time)  Medications  ciprofloxacin (CIPRO) IVPB 400 mg (not administered)  metroNIDAZOLE (FLAGYL) IVPB 500 mg (not administered)  sodium chloride 0.9 % bolus 1,000 mL (1,000 mLs Intravenous New Bag/Given 01/10/15 0517)  fentaNYL (SUBLIMAZE) injection 25 mcg (25 mcg Intravenous Given 01/10/15 0517)  ondansetron (ZOFRAN) injection 4 mg (4 mg Intravenous Given 01/10/15 0517)  iohexol (OMNIPAQUE) 300 MG/ML solution 50 mL (50 mLs Oral Contrast Given 01/10/15 0451)  iohexol (OMNIPAQUE) 300 MG/ML solution 100 mL (100 mLs Intravenous Contrast Given 01/10/15 8366)    Patient was concerned about pain  medication. We discussed starting out with a low dose and it can be repeated as needed. Patient was given IV for IV for hydration, IV pain and nausea medicine and CT of her abdomen was ordered.  06:55 pt was given her CT results. She wants to go home if at all possible. Refusing more pain medication. States she normally takes Advil, we discussed not taking that for this pain but she could take acetaminophen. Does not have a gastroenterologist and states she will refuse/and has refused to have a colonoscopy.  She was given her first dose of antibiotics IV. We discussed reasons to return to the ED such as fever, worsening pain, blood in her stool, or uncontrollable nausea and vomiting. She was advised to call her PCP office today and get a follow-up appointment either tomorrow, which is Friday or Saturday at the walk-in clinic.   Labs Review Results for orders placed or performed  during the hospital encounter of 01/10/15  Lipase, blood  Result Value Ref Range   Lipase 21 (L) 22 - 51 U/L  Comprehensive metabolic panel  Result Value Ref Range   Sodium 139 135 - 145 mmol/L   Potassium 3.6 3.5 - 5.1 mmol/L   Chloride 104 101 - 111 mmol/L   CO2 28 22 - 32 mmol/L   Glucose, Bld 133 (H) 65 - 99 mg/dL   BUN 18 6 - 20 mg/dL   Creatinine, Ser 0.62 0.44 - 1.00 mg/dL   Calcium 9.2 8.9 - 10.3 mg/dL   Total Protein 7.0 6.5 - 8.1 g/dL   Albumin 3.8 3.5 - 5.0 g/dL   AST 22 15 - 41 U/L   ALT 20 14 - 54 U/L   Alkaline Phosphatase 89 38 - 126 U/L   Total Bilirubin 0.8 0.3 - 1.2 mg/dL   GFR calc non Af Amer >60 >60 mL/min   GFR calc Af Amer >60 >60 mL/min   Anion gap 7 5 - 15  CBC  Result Value Ref Range   WBC 10.8 (H) 4.0 - 10.5 K/uL   RBC 4.68 3.87 - 5.11 MIL/uL   Hemoglobin 15.3 (H) 12.0 - 15.0 g/dL   HCT 45.0 36.0 - 46.0 %   MCV 96.2 78.0 - 100.0 fL   MCH 32.7 26.0 - 34.0 pg   MCHC 34.0 30.0 - 36.0 g/dL   RDW 12.7 11.5 - 15.5 %   Platelets 263 150 - 400 K/uL  Urinalysis, Routine w reflex  microscopic (not at [facility])  Result Value Ref Range   Color, Urine YELLOW YELLOW   APPearance CLOUDY (A) CLEAR   Specific Gravity, Urine 1.020 1.005 - 1.030   pH 7.0 5.0 - 8.0   Glucose, UA NEGATIVE NEGATIVE mg/dL   Hgb urine dipstick NEGATIVE NEGATIVE   Bilirubin Urine NEGATIVE NEGATIVE   Ketones, ur NEGATIVE NEGATIVE mg/dL   Protein, ur NEGATIVE NEGATIVE mg/dL   Urobilinogen, UA 1.0 0.0 - 1.0 mg/dL   Nitrite NEGATIVE NEGATIVE   Leukocytes, UA TRACE (A) NEGATIVE  Urine microscopic-add on  Result Value Ref Range   Squamous Epithelial / LPF RARE RARE   WBC, UA 3-6 <3 WBC/hpf   RBC / HPF 3-6 <3 RBC/hpf   Bacteria, UA RARE RARE   Urine-Other MUCOUS PRESENT    Laboratory interpretation all normal except leukocytosis, mild elevation of Hb c/w dehydration     Imaging Review Ct Abdomen Pelvis W Contrast  01/10/2015   CLINICAL DATA:  Left-sided abdominal pain with abdominal distention. History of small bowel resection post perforation 1 year prior.  EXAM: CT ABDOMEN AND PELVIS WITH CONTRAST  TECHNIQUE: Multidetector CT imaging of the abdomen and pelvis was performed using the standard protocol following bolus administration of intravenous contrast.  CONTRAST:  158mL OMNIPAQUE IOHEXOL 300 MG/ML  SOLN  COMPARISON:  CT 07/09/2013  FINDINGS: Lower chest:  Linear atelectasis in both lower lobes.  Liver: Subcapsular 9 mm cyst in the right lobe, unchanged. No new hepatic lesion.  Hepatobiliary: Gallbladder physiologically distended. Biliary prominence is unchanged from prior exam, common bile duct measures 12 mm in its midportion.  Pancreas: Mild pancreatic ductal prominence, unchanged from prior. No peripancreatic inflammatory change.  Spleen: Normal.  Adrenal glands: No nodule.  Kidneys: Symmetric renal enhancement. No hydronephrosis. 8 mm cyst in the right kidney, unchanged. The previous left renal stone is not well seen.  Stomach/Bowel: Jejunal diverticulum in the mid left abdomen measures 3.7 cm  (axial image  3 37/76) with associated wall thickening and surrounding inflammation consistent with acute diverticulitis. There is adjacent fat stranding but no perforation, extraluminal air or abscess. Probable additional small bowel diverticula seen in the jejunum in the left lower abdomen. No small bowel obstruction. There is distal colonic diverticulosis without colonic diverticulitis, moderate stool throughout the colon. The appendix is not definitively identified.  Vascular/Lymphatic: No retroperitoneal adenopathy. Small mesenteric lymph nodes in the central mesenteric. Abdominal aorta is normal in caliber. Atherosclerosis of the abdominal aorta and its branches including the origin of the right renal artery.  Reproductive: Posterior uterine fibroids. The ovaries are not definitively identified.  Bladder: Physiologically distended.  Other: No free air or intra-abdominal fluid collection. Trace free fluid in the pelvis is likely reactive. No ascites. Fat containing upper abdominal wall ventral hernia. Laxity of the lower anterior abdominal wall.  Musculoskeletal: There are no acute or suspicious osseous abnormalities. Degenerative change in the lumbar spine with degenerative disc disease and facet arthropathy.  IMPRESSION: 1. Small bowel diverticulum with surrounding inflammation consistent with diverticulitis involving a large (3.7 cm) diverticulum arising from the jejunum in the left mid central abdomen. No perforation or abscess. 2. Probable additional noninflamed small bowel diverticula, as well as colonic diverticulosis without colonic diverticulitis. 3. Incidental findings of uterine fibroids, unchanged hepatic and right renal cysts.   Electronically Signed   By: Jeb Levering M.D.   On: 01/10/2015 06:45   I have personally reviewed and evaluated these images and lab results as part of my medical decision-making.   EKG Interpretation None      MDM   Final diagnoses:  Diverticulitis of small  intestine without perforation or abscess without bleeding    New Prescriptions   CIPROFLOXACIN (CIPRO) 500 MG TABLET    Take 1 tablet (500 mg total) by mouth 2 (two) times daily.   METRONIDAZOLE (FLAGYL) 500 MG TABLET    Take 1 tablet (500 mg total) by mouth 2 (two) times daily.   ONDANSETRON (ZOFRAN) 4 MG TABLET    Take 1 tablet (4 mg total) by mouth every 8 (eight) hours as needed for nausea or vomiting.    Plan discharge  Rolland Porter, MD, Barbette Or, MD 01/10/15 [phone]

## 2015-01-10 NOTE — ED Notes (Signed)
Pt being driven home by spouse

## 2015-01-10 NOTE — Discharge Instructions (Signed)
Drink plenty of fluids. Avoid fried, spicy or greasy foods. Take the antibiotics until gone. You can take acetaminophen 650 mg 4 times a day for pain.  Call your primary care doctor's office today to get a recheck appointment in 2 days (you could be seen at the walk-in or acute clinic on Saturday). Return to the emergency department if you get a high fever, worsening pain, he see blood in your bowel movements, or you have uncontrollable vomiting or pain.  Do not eat the high fiber diet until you are better.    Diverticulitis Diverticulitis is when small pockets that have formed in your colon (large intestine) become infected or swollen. HOME CARE  Follow your doctor's instructions.  Follow a special diet if told by your doctor.  When you feel better, your doctor may tell you to change your diet. You may be told to eat a lot of fiber. Fruits and vegetables are good sources of fiber. Fiber makes it easier to poop (have bowel movements).  Take supplements or probiotics as told by your doctor.  Only take medicines as told by your doctor.  Keep all follow-up visits with your doctor. GET HELP IF:  Your pain does not get better.  You have a hard time eating food.  You are not pooping like normal. GET HELP RIGHT AWAY IF:  Your pain gets worse.  Your problems do not get better.  Your problems suddenly get worse.  You have a fever.  You keep throwing up (vomiting).  You have bloody or black, tarry poop (stool). MAKE SURE YOU:   Understand these instructions.  Will watch your condition.  Will get help right away if you are not doing well or get worse. Document Released: 10/21/2007 Document Revised: 05/09/2013 Document Reviewed: 03/29/2013 [facility] Patient Information 2015 [place], [place]. This information is not intended to replace advice given to you by your health care provider. Make sure you discuss any questions you have with your health care provider.

## 2015-01-10 NOTE — ED Notes (Signed)
MD at bedside. 

## 2015-01-10 NOTE — ED Notes (Signed)
Pt is c/o abd pain that started a week ago  Pt states she was seen by her PCP yesterday and they were going to schedule pt to have some tests done  Pt states the pain has gotten worse tonight  Pt states she feels like her abdomen is bloated  Denies N/V/D

## 2015-01-14 DIAGNOSIS — K5792 Diverticulitis of intestine, part unspecified, without perforation or abscess without bleeding: Secondary | ICD-10-CM | POA: Diagnosis not present

## 2015-02-08 ENCOUNTER — Other Ambulatory Visit: Payer: Self-pay

## 2015-02-08 DIAGNOSIS — Z1231 Encounter for screening mammogram for malignant neoplasm of breast: Secondary | ICD-10-CM

## 2015-03-13 DIAGNOSIS — I10 Essential (primary) hypertension: Secondary | ICD-10-CM | POA: Diagnosis not present

## 2015-03-14 ENCOUNTER — Ambulatory Visit
Admission: RE | Admit: 2015-03-14 | Discharge: 2015-03-14 | Disposition: A | Discharge status: Home / Self Care | Payer: Medicare Other | Source: Ambulatory Visit

## 2015-03-14 DIAGNOSIS — Z1231 Encounter for screening mammogram for malignant neoplasm of breast: Secondary | ICD-10-CM | POA: Diagnosis not present

## 2015-05-01 DIAGNOSIS — H04123 Dry eye syndrome of bilateral lacrimal glands: Secondary | ICD-10-CM | POA: Diagnosis not present

## 2015-07-30 DIAGNOSIS — R350 Frequency of micturition: Secondary | ICD-10-CM | POA: Diagnosis not present

## 2015-09-26 DIAGNOSIS — G5701 Lesion of sciatic nerve, right lower limb: Secondary | ICD-10-CM | POA: Diagnosis not present

## 2015-12-19 DIAGNOSIS — S81801A Unspecified open wound, right lower leg, initial encounter: Secondary | ICD-10-CM | POA: Diagnosis not present

## 2016-02-07 DIAGNOSIS — L03115 Cellulitis of right lower limb: Secondary | ICD-10-CM | POA: Diagnosis not present

## 2016-02-20 ENCOUNTER — Other Ambulatory Visit: Payer: Self-pay | Admitting: Physician Assistant

## 2016-02-20 DIAGNOSIS — Z1231 Encounter for screening mammogram for malignant neoplasm of breast: Secondary | ICD-10-CM

## 2016-03-02 DIAGNOSIS — R609 Edema, unspecified: Secondary | ICD-10-CM | POA: Diagnosis not present

## 2016-03-02 DIAGNOSIS — L03115 Cellulitis of right lower limb: Secondary | ICD-10-CM | POA: Diagnosis not present

## 2016-03-13 DIAGNOSIS — I10 Essential (primary) hypertension: Secondary | ICD-10-CM | POA: Diagnosis not present

## 2016-03-13 DIAGNOSIS — L03115 Cellulitis of right lower limb: Secondary | ICD-10-CM | POA: Diagnosis not present

## 2016-03-17 ENCOUNTER — Ambulatory Visit: Payer: Medicare Other

## 2016-04-01 ENCOUNTER — Ambulatory Visit
Admission: RE | Admit: 2016-04-01 | Discharge: 2016-04-01 | Disposition: A | Discharge status: Home / Self Care | Payer: Medicare Other | Source: Ambulatory Visit | Attending: Physician Assistant | Admitting: Physician Assistant

## 2016-04-01 DIAGNOSIS — Z1231 Encounter for screening mammogram for malignant neoplasm of breast: Secondary | ICD-10-CM | POA: Diagnosis not present

## 2016-04-14 DIAGNOSIS — N951 Menopausal and female climacteric states: Secondary | ICD-10-CM | POA: Diagnosis not present

## 2016-04-14 DIAGNOSIS — I10 Essential (primary) hypertension: Secondary | ICD-10-CM | POA: Diagnosis not present

## 2016-04-21 DIAGNOSIS — N951 Menopausal and female climacteric states: Secondary | ICD-10-CM | POA: Diagnosis not present

## 2016-04-21 DIAGNOSIS — I10 Essential (primary) hypertension: Secondary | ICD-10-CM | POA: Diagnosis not present

## 2016-08-20 DIAGNOSIS — H04123 Dry eye syndrome of bilateral lacrimal glands: Secondary | ICD-10-CM | POA: Diagnosis not present

## 2016-08-27 IMAGING — CR DG CLAVICLE*L*
2 series · 2 of 2 positions shown · non-contrast
Comparison: None

CLINICAL DATA: Fell today on driveway at patient's house fell
striking LEFT clavicular region, heard a crack

EXAM:
LEFT CLAVICLE - 2+ VIEWS

[x clavicle ap left (1 of 2)]
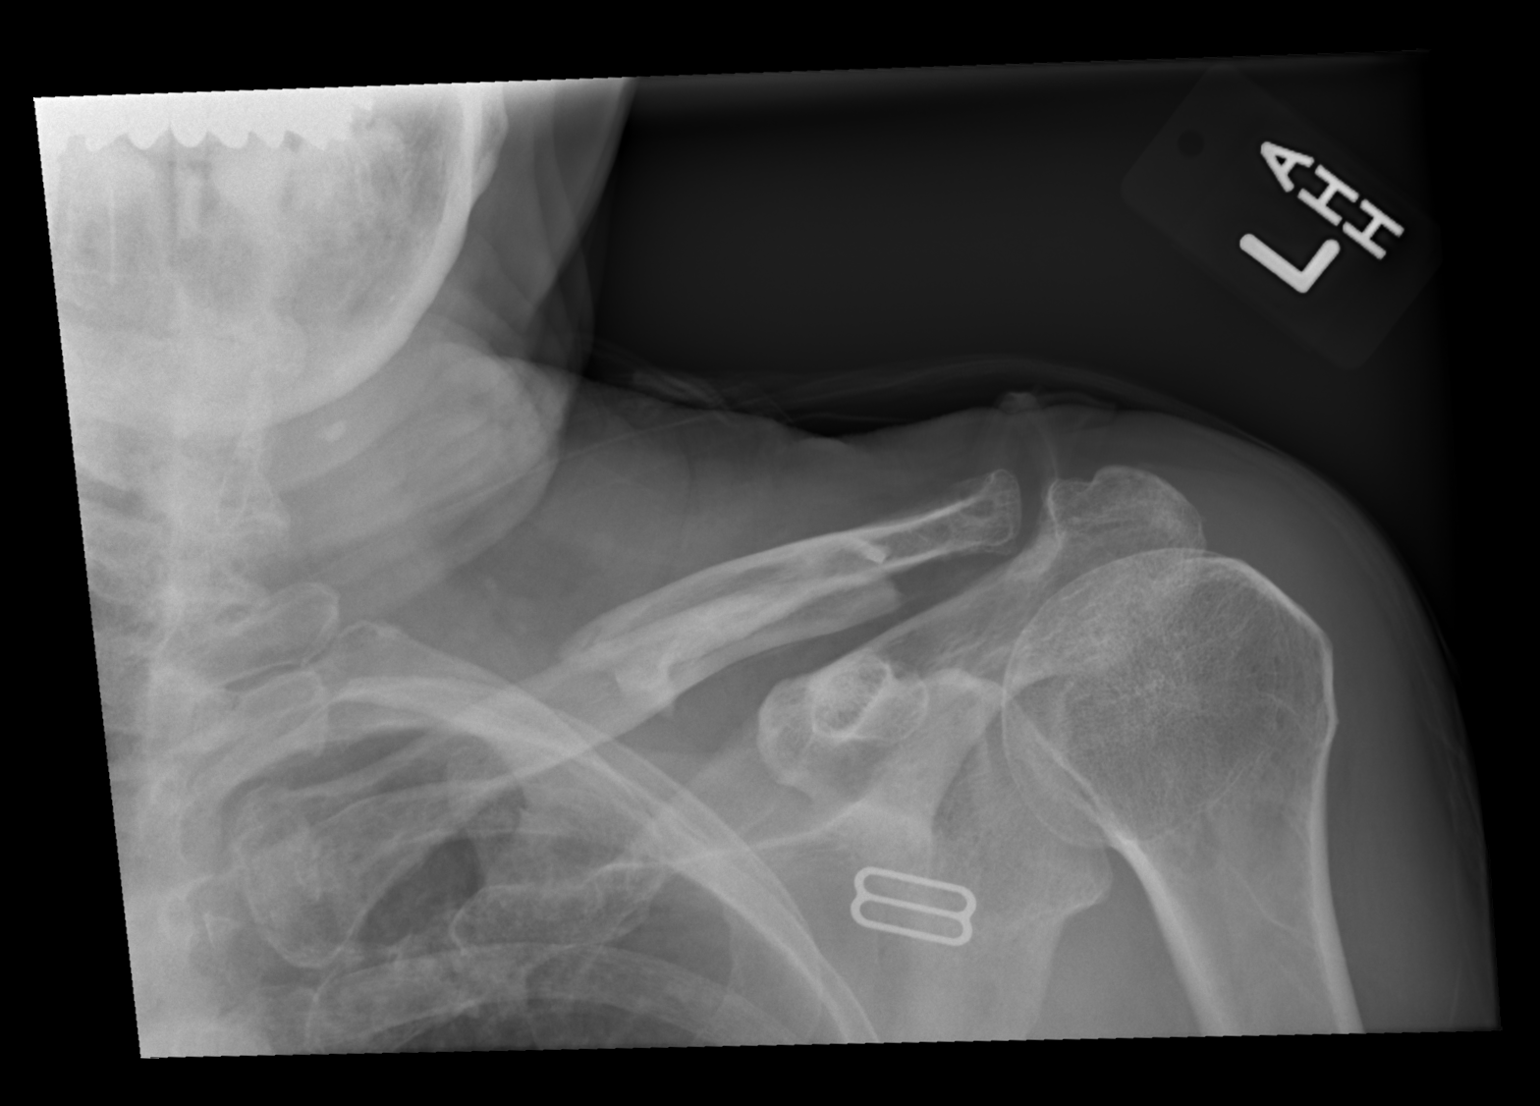

[x clavicle ap left (2 of 2)]
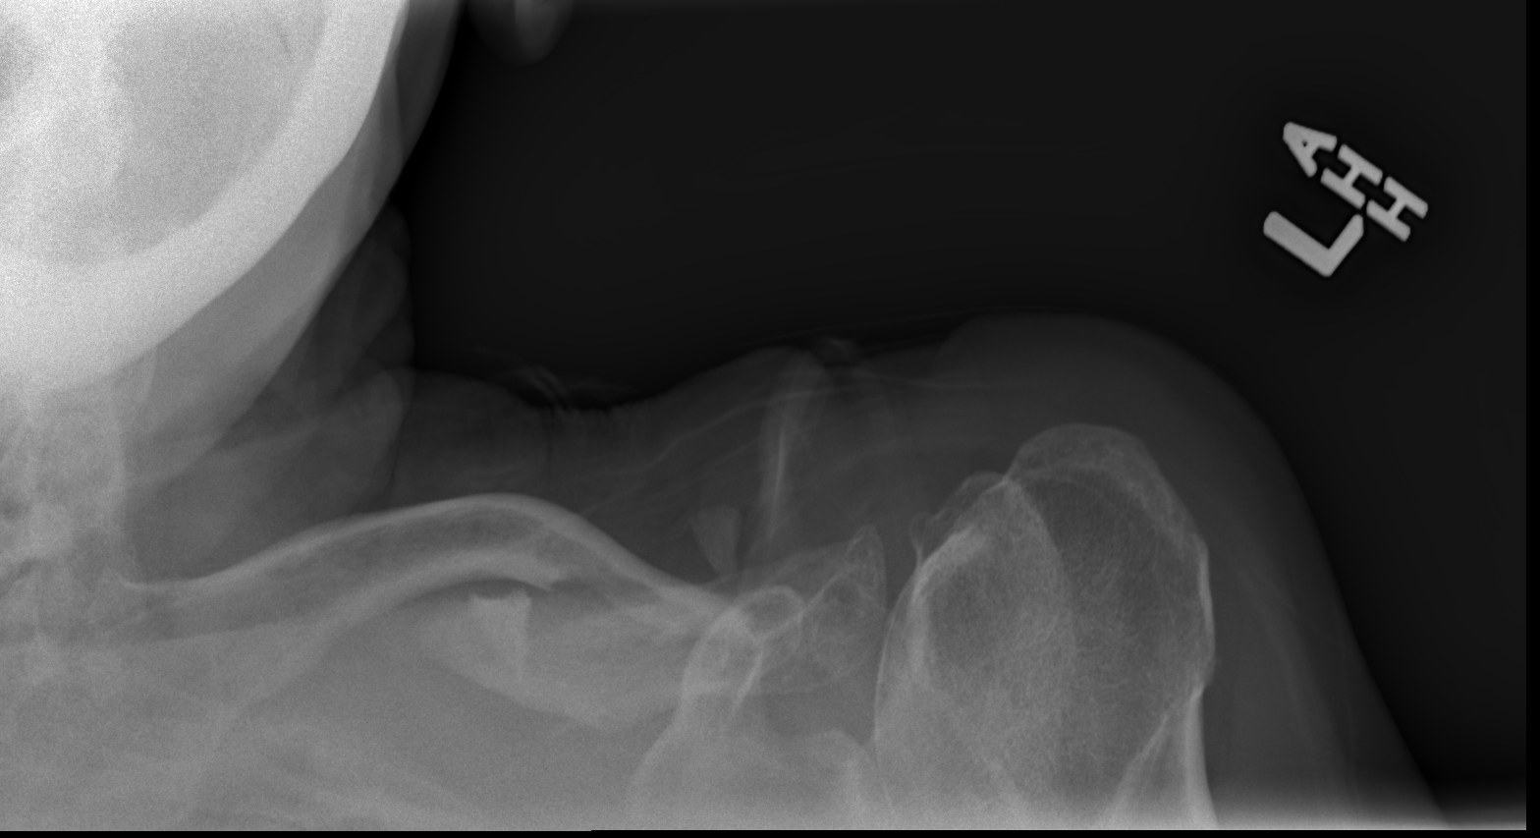

[2 of 2 positions shown; findings below may reference images not displayed]

FINDINGS: Osseous demineralization.

Sternoclavicular and acromioclavicular joint alignments normal.

Lung oblique middle third of LEFT clavicle displaced cranially.

No additional fracture or dislocation identified.
IMPRESSION: Oblique displaced fracture middle third LEFT clavicle.

## 2016-09-22 DIAGNOSIS — R079 Chest pain, unspecified: Secondary | ICD-10-CM | POA: Diagnosis not present

## 2016-09-22 DIAGNOSIS — I10 Essential (primary) hypertension: Secondary | ICD-10-CM | POA: Diagnosis not present

## 2016-09-22 DIAGNOSIS — Z1211 Encounter for screening for malignant neoplasm of colon: Secondary | ICD-10-CM | POA: Diagnosis not present

## 2016-09-22 DIAGNOSIS — I350 Nonrheumatic aortic (valve) stenosis: Secondary | ICD-10-CM | POA: Diagnosis not present

## 2016-09-22 DIAGNOSIS — Z0000 Encounter for general adult medical examination without abnormal findings: Secondary | ICD-10-CM | POA: Diagnosis not present

## 2016-09-23 ENCOUNTER — Telehealth: Payer: Self-pay | Admitting: *Deleted

## 2016-09-23 NOTE — Telephone Encounter (Signed)
NOTES SENT TO SCHEDULING.  °

## 2016-09-29 ENCOUNTER — Telehealth: Payer: Self-pay | Admitting: Cardiovascular Disease

## 2016-09-29 NOTE — Telephone Encounter (Signed)
Received records from [facility] for appointment on 10/01/16 with Dr Claiborne Billings.  Records put with Dr Evette Georges schedule for 10/01/16. lp

## 2016-10-01 ENCOUNTER — Ambulatory Visit (INDEPENDENT_AMBULATORY_CARE_PROVIDER_SITE_OTHER): Payer: Medicare Other | Admitting: Cardiovascular Disease

## 2016-10-01 ENCOUNTER — Encounter: Payer: Self-pay | Admitting: Cardiovascular Disease

## 2016-10-01 VITALS — BP 144/82 | HR 69 | Ht 64.0 in | Wt 126.0 lb

## 2016-10-01 DIAGNOSIS — Z79899 Other long term (current) drug therapy: Secondary | ICD-10-CM | POA: Diagnosis not present

## 2016-10-01 DIAGNOSIS — R600 Localized edema: Secondary | ICD-10-CM

## 2016-10-01 DIAGNOSIS — I358 Other nonrheumatic aortic valve disorders: Secondary | ICD-10-CM

## 2016-10-01 DIAGNOSIS — I10 Essential (primary) hypertension: Secondary | ICD-10-CM

## 2016-10-01 MED ORDER — DILTIAZEM HCL ER COATED BEADS 180 MG PO CP24: 180.0000 mg | ORAL_CAPSULE | Freq: Every day | ORAL | 3 refills | Status: DC

## 2016-10-01 NOTE — Patient Instructions (Addendum)
Medication Instructions:   The diltiazem has been increased to 180 mg. A new prescription has been sent to your pharmacy to reflect this change.   Labwork:  Labs were ordered. Slips have been provided to you today to take with you to the lab.  Testing/Procedures:  Your physician has requested that you have an echocardiogram. Echocardiography is a painless test that uses sound waves to create images of your heart. It provides your doctor with information about the size and shape of your heart and how well your heart's chambers and valves are working. This procedure takes approximately one hour. There are no restrictions for this procedure.  This will be done st the [facility].  Follow-Up:  4-6 weeks with Dr Claiborne Billings.  Any Other Special Instructions Will Be Listed Below (If Applicable).

## 2016-10-02 NOTE — Progress Notes (Signed)
Cardiology Office Note    Date:  10/02/2016   ID:  Ellen [name], Ellen [name] Ellen [name], MRN [id number]  PCP:  Ellen Shelter, PA-C  Cardiologist:  Ellen Majestic, MD   Chief Complaint  Patient presents with  . New Patient (Initial Visit)    occassional chest pain in center of chest, has heart murmur, family history of heart attack    History of Present Illness:  Ellen [name] is a 76 y.o. female who is referred for cardiology evaluation through the courtesy of Ellen Shelter PA-C for evaluation of chest pain.  Ellen [name] has a long-standing history of hypertension for at least 25 years.  Recently, she has been maintained on hydrochlorothiazide 12.5 mg daily.  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  She works in the garden almost daily and does heavy lifting.  She has a prosthetic right shoulder.  Recently, she has begun to notice episodes of chest discomfort, particularly after lifting.  She notices this discomfort also.  All laying down on the bed on her back.  She previously had lived in [place].  Over 7 years ago.  She recalls having had a routine stress test.  The chest pain that she's been experiencing has been occurring for approximately 2 months.  She denies any clear-cut exertional precipitation.  She walks a proximally 4 times per week for 2 miles and denies exertionally precipitated chest pain.  She recently was evaluated at [facility] and with her recurrent chest pain symptomatology.  She is now referred for cardiology consultation and evaluation.  Of note, during the evaluation on 09/22/2016 with [facility], her blood pressure was elevated at 152/88 and on repeat was 162/78.  She now presents for evaluation.   Past Medical History:  Diagnosis Date  . Hypertension   . Kidney stones     Past Surgical History:  Procedure Laterality Date  . BOWEL RESECTION   07/09/2013   Procedure: SMALL BOWEL RESECTION;  Surgeon: Earnstine Regal, MD;  Location: [facility];  Service: General;;  . LAPAROTOMY N/A 07/09/2013   Procedure: EXPLORATORY LAPAROTOMY;  Surgeon: Earnstine Regal, MD;  Location: [facility];  Service: General;  Laterality: N/A;    Current Medications: Outpatient Medications Prior to Visit  Medication Sig Dispense Refill  . estrogen, conjugated,-medroxyprogesterone (PREMPRO) 0.45-1.5 MG per tablet Take 1 tablet by mouth continuous. Every third day    . hydrochlorothiazide (HYDRODIURIL) 12.5 MG tablet Take 12.5 mg by mouth daily.    . Omega-3 Fatty Acids (FISH OIL) 500 MG CAPS Take 1 capsule by mouth every other day.    Marland Kitchen acetaminophen-codeine 120-12 MG/5ML solution Take 10 mLs by mouth every 4 (four) hours as needed for moderate pain. (Patient not taking: Reported on 01/10/2015) 120 mL 0  . ciprofloxacin (CIPRO) 500 MG tablet Take 1 tablet (500 mg total) by mouth 2 (two) times daily. (Patient not taking: Reported on 10/01/2016) 20 tablet 0  . diltiazem (CARDIZEM CD) 120 MG 24 hr capsule Take 120 mg by mouth daily.    . metroNIDAZOLE (FLAGYL) 500 MG tablet Take 1 tablet (500 mg total) by mouth 2 (two) times daily. 20 tablet 0  . naproxen sodium (ANAPROX) 220 MG tablet Take 220 mg by mouth daily.    . ondansetron (ZOFRAN) 4 MG tablet Take 1 tablet (4 mg total) by mouth every 8 (eight) hours as needed for nausea  or vomiting. 8 tablet 0   No facility-administered medications prior to visit.      Allergies:   Lactose intolerance (gi) and Sulfa antibiotics   Social History   Social History  . Marital status: Married    Spouse name: N/A  . Number of children: N/A  . Years of education: N/A   Social History Main Topics  . Smoking status: Former Smoker  . Smokeless tobacco: Never Used  . Alcohol use Yes     Comment: wine  . Drug use: No  . Sexual activity: Not Asked   Other Topics Concern  . None   Social History Narrative  . None    Additional  social history is notable in that she is married for 57 years.  She has no children.  She previously worked for [employer] after she received her engineering degree from [employer].  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  She also spent 14 months in [place]. There is remote tobacco history for proximally 20 years but she quit over 15 years ago.  She does gardening and walking essentially every day.  Family History:  The patient's family history includes CAD in her father; Diabetes in her mother; Heart attack in her father and mother. .  She does not have any siblings.  ROS General: Negative; No fevers, chills, or night sweats;  HEENT: Negative; No changes in vision or hearing, sinus congestion, difficulty swallowing Pulmonary: Negative; No cough, wheezing, shortness of breath, hemoptysis Cardiovascular:  See history of present illness GI: Negative; No nausea, vomiting, diarrhea, or abdominal pain GU: Negative; No dysuria, hematuria, or difficulty voiding Musculoskeletal: Prosthetic right shoulder.  She has to use her deltoid muscle to lift her arm. Hematologic/Oncology: Negative; no easy bruising, bleeding Endocrine: Negative; no heat/cold intolerance; no diabetes Neuro: Negative; no changes in balance, headaches Skin: Negative; No rashes or skin lesions Psychiatric: Negative; No behavioral problems, depression Sleep: Negative; No snoring, daytime sleepiness, hypersomnolence, bruxism, restless legs, hypnogognic hallucinations, no cataplexy Other comprehensive 14 point system review is negative.   PHYSICAL EXAM:   VS:  BP (!) 144/82 (BP Location: Right Arm, Patient Position: Sitting, Cuff Size: Normal)   Pulse 69   Ht 5' 4" (1.626 m)   Wt 126 lb (57.2 kg)   BMI 21.63 kg/m     Repeat blood pressure by me was 164/84  Wt Readings from Last 3 Encounters:  10/01/16 126 lb (57.2 kg)  01/10/15 128 lb (58.1 kg)  07/19/13 125 lb (56.7 kg)    General: Alert,  oriented, no distress.  Skin: normal turgor, no rashes, warm and dry HEENT: Normocephalic, atraumatic. Pupils equal round and reactive to light; sclera anicteric; extraocular muscles intact; Fundi arteriolar narrowing.  No hemorrhages or exudates.  Disc flat Nose without nasal septal hypertrophy Mouth/Parynx benign; Mallinpatti scale 3 Neck: No JVD, no carotid bruits; normal carotid upstroke Lungs: clear to ausculatation and percussion; no wheezing or rales Chest wall: without tenderness to palpitation Heart: PMI not displaced, RRR, s1 s2 normal, no S3 gallop.  2/6 systolic murmur, no diastolic murmur, no rubs, gallops, thrills, or heaves Abdomen: soft, nontender; no hepatosplenomehaly, BS+; abdominal aorta nontender and not dilated by palpation. Back: no CVA tenderness Pulses 2+ Musculoskeletal: full range of motion, normal strength, no joint deformities Extremities: Trace edema pretibially bilaterally no clubbing cyanosis, Homan's sign negative  Neurologic: grossly nonfocal; Cranial nerves grossly wnl Psychologic: Normal mood and affect   Studies/Labs Reviewed:   EKG:    EKG is ordered today.  ECG (independently read by me): Normal sinus rhythm at 69 bpm.  No ectopy.  Normal intervals.  Nonspecific ST changes.  Recent Labs: BMP Latest Ref Rng & Units 01/10/2015 07/16/2013 07/11/2013  Glucose 65 - 99 mg/dL 133(H) - 142(H)  BUN 6 - 20 mg/dL 18 - 17  Creatinine 0.44 - 1.00 mg/dL 0.62 0.68 0.65  Sodium 135 - 145 mmol/L 139 - 139  Potassium 3.5 - 5.1 mmol/L 3.6 - 3.6(L)  Chloride 101 - 111 mmol/L 104 - 104  CO2 22 - 32 mmol/L 28 - 25  Calcium 8.9 - 10.3 mg/dL 9.2 - 8.1(L)     Hepatic Function Latest Ref Rng & Units 01/10/2015  Total Protein 6.5 - 8.1 g/dL 7.0  Albumin 3.5 - 5.0 g/dL 3.8  AST 15 - 41 U/L 22  ALT 14 - 54 U/L 20  Alk Phosphatase 38 - 126 U/L 89  Total Bilirubin 0.3 - 1.2 mg/dL 0.8    CBC Latest Ref Rng & Units 01/10/2015 07/16/2013 07/12/2013  WBC 4.0 - 10.5 K/uL 10.8(H)  6.8 8.1  Hemoglobin 12.0 - 15.0 g/dL 15.3(H) 13.8 14.7  Hematocrit 36.0 - 46.0 % 45.0 39.1 43.5  Platelets 150 - 400 K/uL 263 228 117(L)   Lab Results  Component Value Date   MCV 96.2 01/10/2015   MCV 91.4 07/16/2013   MCV 95.8 07/12/2013   No results found for: TSH No results found for: HGBA1C   BNP No results found for: BNP  ProBNP No results found for: PROBNP   Lipid Panel  No results found for: CHOL, TRIG, HDL, CHOLHDL, VLDL, LDLCALC, LDLDIRECT   RADIOLOGY: No results found.   Additional studies/ records that were reviewed today include:  I reviewed the patient's records from [facility].    ASSESSMENT:    1. Essential hypertension   2. Aortic heart murmur   3. 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  She has not experience clear-cut exertional chest pain with activity.  She has experienced a 2 month history of chest pain typically nonexertional, short duration, which does not sound classic for anginal symptomatology.  She has a murmur on exam suggestive of possible aortic stenosis and I'm scheduling her for 2-D echo Doppler study.  Her blood pressure today is elevated and when recently seen at [facility], her blood pressure was also elevated.  He has had some trace edema.  She has been on Cardizem 120 mg daily and with her increased blood pressure.  I recommended titration up to 180 mg. She will continue to take hydrochlorothiazide 12.5 mg daily.  She also takes fish oil capsule daily.  She has not had recent laboratory in a complete set of fasting blood work will be obtained.  I suspect her chest pain most likely is musculoskeletal in etiology and she will try Aleve to see if this improves her symptomatology.  I will see her back in the office in 4-6 weeks for follow-up evaluation.  At that time,   depending upon her symptomatology she may be referred for a stress test, but I will not do this presently.  There is trivial pretibial edema.  She will continue to take her present dose of HCTZ, but if this edema progresses.  She can take an extra 12.5 mg as needed.  Sodium restriction   was recommended.    Medication Adjustments/Labs and Tests Ordered: Current medicines are reviewed at length with the patient today.  Concerns regarding medicines are outlined above.  Medication changes, Labs and Tests ordered today are listed in the Patient Instructions below.  Patient Instructions  Medication Instructions:   The diltiazem has been increased to 180 mg. A new prescription has been sent to your pharmacy to reflect this change.   Labwork:  Labs were ordered. Slips have been provided to you today to take with you to the lab.  Testing/Procedures:  Your physician has requested that you have an echocardiogram. Echocardiography is a painless test that uses sound waves to create images of your heart. It provides your doctor with information about the size and shape of your heart and how well your heart's chambers and valves are working. This procedure takes approximately one hour. There are no restrictions for this procedure.  This will be done st the [address] [facility].  Follow-Up:  4-6 weeks with Dr kelly.  Any Other Special Instructions Will Be Listed Below (If Applicable).      Signed, Thomas Kelly, MD  10/02/2016 6:04 PM    [facility] [address], [address], [place], [place]  [address] Phone: [phone]    

## 2016-10-15 ENCOUNTER — Other Ambulatory Visit: Payer: Self-pay

## 2016-10-15 ENCOUNTER — Ambulatory Visit (HOSPITAL_COMMUNITY): Payer: Medicare Other | Attending: Internal Medicine

## 2016-10-15 DIAGNOSIS — I10 Essential (primary) hypertension: Secondary | ICD-10-CM | POA: Insufficient documentation

## 2016-10-15 DIAGNOSIS — R011 Cardiac murmur, unspecified: Secondary | ICD-10-CM | POA: Diagnosis not present

## 2016-10-15 DIAGNOSIS — I358 Other nonrheumatic aortic valve disorders: Secondary | ICD-10-CM | POA: Insufficient documentation

## 2016-10-21 DIAGNOSIS — I10 Essential (primary) hypertension: Secondary | ICD-10-CM | POA: Diagnosis not present

## 2016-10-21 DIAGNOSIS — Z79899 Other long term (current) drug therapy: Secondary | ICD-10-CM | POA: Diagnosis not present

## 2016-10-21 LAB — CBC
HCT: 48.8 % — ABNORMAL HIGH (ref 35.0–45.0)
HEMOGLOBIN: 16.4 g/dL — AB (ref 11.7–15.5)
MCH: 32.8 pg (ref 27.0–33.0)
MCHC: 33.6 g/dL (ref 32.0–36.0)
MCV: 97.6 fL (ref 80.0–100.0)
MPV: 11.4 fL (ref 7.5–12.5)
Platelets: 313 10*3/uL (ref 140–400)
RBC: 5 MIL/uL (ref 3.80–5.10)
RDW: 13 % (ref 11.0–15.0)
WBC: 6.3 10*3/uL (ref 3.8–10.8)

## 2016-10-21 LAB — LIPID PANEL
Cholesterol: 190 mg/dL (ref <200)
HDL: 71 mg/dL (ref >50)
LDL CALC: 104 mg/dL — AB (ref <100)
TRIGLYCERIDES: 77 mg/dL (ref <150)
Total CHOL/HDL Ratio: 2.7 Ratio (ref <5.0)
VLDL: 15 mg/dL (ref <30)

## 2016-10-21 LAB — COMPREHENSIVE METABOLIC PANEL
ALBUMIN: 4 g/dL (ref 3.6–5.1)
ALT: 17 U/L (ref 6–29)
AST: 16 U/L (ref 10–35)
Alkaline Phosphatase: 78 U/L (ref 33–130)
BILIRUBIN TOTAL: 0.5 mg/dL (ref 0.2–1.2)
BUN: 28 mg/dL — ABNORMAL HIGH (ref 7–25)
CALCIUM: 9.2 mg/dL (ref 8.6–10.4)
CO2: 29 mmol/L (ref 20–31)
Chloride: 105 mmol/L (ref 98–110)
Creat: 0.75 mg/dL (ref 0.60–0.93)
Glucose, Bld: 91 mg/dL (ref 65–99)
POTASSIUM: 4.4 mmol/L (ref 3.5–5.3)
Sodium: 143 mmol/L (ref 135–146)
Total Protein: 6.2 g/dL (ref 6.1–8.1)

## 2016-10-21 LAB — TSH: TSH: 1.76 mIU/L

## 2016-11-03 DIAGNOSIS — Z1211 Encounter for screening for malignant neoplasm of colon: Secondary | ICD-10-CM | POA: Diagnosis not present

## 2016-11-06 ENCOUNTER — Encounter: Payer: Self-pay | Admitting: Cardiovascular Disease

## 2016-11-06 ENCOUNTER — Ambulatory Visit (INDEPENDENT_AMBULATORY_CARE_PROVIDER_SITE_OTHER): Payer: Medicare Other | Admitting: Cardiovascular Disease

## 2016-11-06 VITALS — BP 158/83 | HR 60 | Ht 64.0 in | Wt 125.2 lb

## 2016-11-06 DIAGNOSIS — I10 Essential (primary) hypertension: Secondary | ICD-10-CM | POA: Diagnosis not present

## 2016-11-06 DIAGNOSIS — Z79899 Other long term (current) drug therapy: Secondary | ICD-10-CM

## 2016-11-06 DIAGNOSIS — R0789 Other chest pain: Secondary | ICD-10-CM | POA: Diagnosis not present

## 2016-11-06 DIAGNOSIS — I350 Nonrheumatic aortic (valve) stenosis: Secondary | ICD-10-CM | POA: Diagnosis not present

## 2016-11-06 DIAGNOSIS — E785 Hyperlipidemia, unspecified: Secondary | ICD-10-CM

## 2016-11-06 MED ORDER — VALSARTAN 80 MG PO TABS: 80.0000 mg | ORAL_TABLET | Freq: Every day | ORAL | 12 refills | Status: DC

## 2016-11-06 NOTE — Progress Notes (Signed)
Cardiology Office Note    Date:  11/07/2016   ID:  Burgundy, Matuszak 01/17/1941, MRN [id number]  PCP:  Corine Shelter, PA-C  Cardiologist:  Shelva Majestic, MD   No chief complaint on file.   History of Present Illness:  Ellen [name] is a 76 y.o. female who was referred for cardiology evaluation through the courtesy of Corine Shelter PA-C for evaluation of chest pain.  She was initially seen on 10/02/2016 and presents for follow-up evaluation.  Ellen [name] has a long-standing history of hypertension for at least 25 years.  Recently, she has been maintained on hydrochlorothiazide 12.5 mg daily.  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  She works in the garden almost daily and does heavy lifting.  She has a prosthetic right shoulder.  Recently, she has begun to notice episodes of chest discomfort, particularly after lifting.  She notices this discomfort also.  All laying down on the bed on her back.  She previously had lived in [place].  Over 7 years ago.  She recalls having had a routine stress test.  The chest pain that she's been experiencing has been occurring for approximately 2 months.  She denies any clear-cut exertional precipitation.  She walks a proximally 4 times per week for 2 miles and denies exertionally precipitated chest pain.  She  was evaluated at [facility] and with her recurrent chest pain symptomatology.  She is now referred for cardiology consultation and evaluation.  Of note, during the evaluation on 09/22/2016 with [facility], her blood pressure was elevated at 152/88 and on repeat was 162/78.    When I saw her for initial evaluation, her blood pressure was elevated and I recommended that she increase Cardizem from 120 mg to 180 mg.  In addition, she had a murmur on physical exam, suggesting possible aortic stenosis and I scheduled her for 2-D echo  Doppler study.  Her chest pain was most likely musculoskeletal in etiology from her 8-10 hours a day of gardening.  She apparently felt that she did not tolerate the increase Cardizem dose and resumed her prior dose.  She underwent an echo Doppler study on 10/15/2016 which showed an EF of 65-70%.  There was mild aortic stenosis with a mean gradient of 10 and peak gradient of 19.  She had grade 1 diastolic dysfunction.  Laboratory revealed a normal TSH.  Hemoglobin and hematocrit were stable at 16.4 and 48.8.  Creatinine was 0.75 with a BUN of 28.  LFTs were normal.  Total cholesterol was 190, triglycerides 77, HDL 71, and LDL 104.  She presents for follow-up evaluation.   Past Medical History:  Diagnosis Date  . Hypertension   . Kidney stones     Past Surgical History:  Procedure Laterality Date  . BOWEL RESECTION  07/09/2013   Procedure: SMALL BOWEL RESECTION;  Surgeon: Earnstine Regal, MD;  Location: [facility];  Service: General;;  . LAPAROTOMY N/A 07/09/2013   Procedure: EXPLORATORY LAPAROTOMY;  Surgeon: Earnstine Regal, MD;  Location: [facility];  Service: General;  Laterality: N/A;    Current Medications: Outpatient Medications Prior to Visit  Medication Sig Dispense Refill  . estrogen, conjugated,-medroxyprogesterone (PREMPRO) 0.45-1.5 MG per tablet Take 1 tablet by mouth continuous. Every third day    . hydrochlorothiazide (HYDRODIURIL) 12.5 MG tablet Take 12.5 mg by mouth daily.    . Omega-3  Fatty Acids (FISH OIL) 500 MG CAPS Take 1 capsule by mouth every other day.    . diltiazem (CARDIZEM CD) 180 MG 24 hr capsule Take 1 capsule (180 mg total) by mouth daily. 90 capsule 3   No facility-administered medications prior to visit.      Allergies:   Lactose intolerance (gi) and Sulfa antibiotics   Social History   Social History  . Marital status: Married    Spouse name: N/A  . Number of children: N/A  . Years of education: N/A   Social History Main Topics  . Smoking status: Former  Games developer  . Smokeless tobacco: Never Used  . Alcohol use Yes     Comment: wine  . Drug use: No  . Sexual activity: Not Asked   Other Topics Concern  . None   Social History Narrative  . None    Additional social history is notable in that she is married for 57 years.  She has no children.  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  She also spent 14 months in [place]. There is remote tobacco history for ~ 20 years but she quit over 15 years ago.  She does gardening and walking essentially every day.  Family History:  The patient's family history includes CAD in her father; Diabetes in her mother; Heart attack in her father and mother. .  She does not have any siblings.  ROS General: Negative; No fevers, chills, or night sweats;  HEENT: Negative; No changes in vision or hearing, sinus congestion, difficulty swallowing Pulmonary: Negative; No cough, wheezing, shortness of breath, hemoptysis Cardiovascular:  See history of present illness GI: Negative; No nausea, vomiting, diarrhea, or abdominal pain GU: Negative; No dysuria, hematuria, or difficulty voiding Musculoskeletal: Prosthetic right shoulder.  She has to use her deltoid muscle to lift her arm. Hematologic/Oncology: Negative; no easy bruising, bleeding Endocrine: Negative; no heat/cold intolerance; no diabetes Neuro: Negative; no changes in balance, headaches Skin: Negative; No rashes or skin lesions Psychiatric: Negative; No behavioral problems, depression Sleep: Negative; No snoring, daytime sleepiness, hypersomnolence, bruxism, restless legs, hypnogognic hallucinations, no cataplexy Other comprehensive 14 point system review is negative.   PHYSICAL EXAM:   VS:  BP (!) 158/83 (BP Location: Right Arm)   Pulse 60   Ht 5\' 4"  (1.626 m)   Wt 125 lb 3.2 oz (56.8 kg)   BMI 21.49 kg/m     Repeat blood pressure  by me was 164/84  Wt Readings from Last 3 Encounters:  11/06/16 125 lb 3.2 oz (56.8 kg)  10/01/16 126 lb (57.2 kg)  01/10/15 128 lb (58.1 kg)      Physical Exam BP (!) 158/83 (BP Location: Right Arm)   Pulse 60   Ht 5\' 4"  (1.626 m)   Wt 125 lb 3.2 oz (56.8 kg)   BMI 21.49 kg/m  General: Alert, oriented, no distress.  Skin: normal turgor, no rashes, warm and dry HEENT: Normocephalic, atraumatic. Pupils equal round and reactive to light; sclera anicteric; extraocular muscles intact;  Nose without nasal septal hypertrophy Mouth/Parynx benign; Mallinpatti scalev3 Neck: No JVD, no carotid bruits; normal carotid upstroke Lungs: clear to ausculatation and percussion; no wheezing or rales Chest wall: without tenderness to palpitation Heart: PMI not displaced, RRR, s1 s2 normal, 2/6 systolic murmur in the aortic area; no diastolic murmur, no rubs, gallops, thrills, or heaves Abdomen: soft, nontender; no  hepatosplenomehaly, BS+; abdominal aorta nontender and not dilated by palpation. Back: no CVA tenderness Pulses 2+ Musculoskeletal: full range of motion, normal strength, no joint deformities Extremities: no clubbing cyanosis or edema, Homan's sign negative  Neurologic: grossly nonfocal; Cranial nerves grossly wnl Psychologic: Normal mood and affect   Studies/Labs Reviewed:   EKG:  EKG is ordered today.  ECG (independently read by me): Normal sinus rhythm at 60 bpm.  Normal intervals.  No ST segment changes.  ECG (independently read by me): Normal sinus rhythm at 69 bpm.  No ectopy.  Normal intervals.  Nonspecific ST changes.  Recent Labs: BMP Latest Ref Rng & Units 10/21/2016 01/10/2015 07/16/2013  Glucose 65 - 99 mg/dL 91 133(H) -  BUN 7 - 25 mg/dL 28(H) 18 -  Creatinine 0.60 - 0.93 mg/dL 0.75 0.62 0.68  Sodium 135 - 146 mmol/L 143 139 -  Potassium 3.5 - 5.3 mmol/L 4.4 3.6 -  Chloride 98 - 110 mmol/L 105 104 -  CO2 20 - 31 mmol/L 29 28 -  Calcium 8.6 - 10.4 mg/dL 9.2 9.2 -      Hepatic Function Latest Ref Rng & Units 10/21/2016 01/10/2015  Total Protein 6.1 - 8.1 g/dL 6.2 7.0  Albumin 3.6 - 5.1 g/dL 4.0 3.8  AST 10 - 35 U/L 16 22  ALT 6 - 29 U/L 17 20  Alk Phosphatase 33 - 130 U/L 78 89  Total Bilirubin 0.2 - 1.2 mg/dL 0.5 0.8    CBC Latest Ref Rng & Units 10/21/2016 01/10/2015 07/16/2013  WBC 3.8 - 10.8 K/uL 6.3 10.8(H) 6.8  Hemoglobin 11.7 - 15.5 g/dL 16.4(H) 15.3(H) 13.8  Hematocrit 35.0 - 45.0 % 48.8(H) 45.0 39.1  Platelets 140 - 400 K/uL 313 263 228   Lab Results  Component Value Date   MCV 97.6 10/21/2016   MCV 96.2 01/10/2015   MCV 91.4 07/16/2013   Lab Results  Component Value Date   TSH 1.76 10/21/2016   No results found for: HGBA1C   BNP No results found for: BNP  ProBNP No results found for: PROBNP   Lipid Panel     Component Value Date/Time   CHOL 190 10/21/2016 0807   TRIG 77 10/21/2016 0807   HDL 71 10/21/2016 0807   CHOLHDL 2.7 10/21/2016 0807   VLDL 15 10/21/2016 0807   LDLCALC 104 (H) 10/21/2016 0807     RADIOLOGY: No results found.   Additional studies/ records that were reviewed today include:  I reviewed the patient's records from [place]. I reviewed her laboratory from 10/21/16 and most recent echo Doppler study from Oct 15, 2016    ASSESSMENT:    1. Essential hypertension   2. Mild aortic stenosis   3. Mild hyperlipidemia   4. Medication management   5. 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  She has been working up to 8-10 hours a day.  When I initially saw her, she had musculoskeletal chest pain which has resolved.  Her physical examination suggests a murmur of aortic stenosis and on echo Doppler study, which I reviewed with her in detail today.  This confirms normal systolic function with grade 1 diastolic dysfunction and  mild aortic stenosis.  She continues to be hypertensive today but apparently did not tolerate increasing her Cardizem from 120-180.  I have suggested that  she discontinue Cardizem and in its place I will start her on angiotensin receptor blocker therapy with valsartan, initially at 80 mg.  However, I suspect this dose may need to be further titrated to 160 mg depending upon her blood pressure response.  She will monitor her blood pressure at home.  I reviewed her lipid studies.  We discussed trying to get her LDL cholesterol lower.  Since mild plaque in stool form and her current LDL of 104.  She is not having any presyncope or syncope and denies any CHF symptomatology.  In 2 weeks.  I will check a BMET and make certain she is tolerating valsartan.  I will see her in 2 months for reevaluation or sooner if problems arise.  Medication Adjustments/Labs and Tests Ordered: Current medicines are reviewed at length with the patient today.  Concerns regarding medicines are outlined above.  Medication changes, Labs and Tests ordered today are listed in the Patient Instructions below.  Patient Instructions  Medication Instructions:   STOP DILTIAZEM  START VALSARTAN 80 MG ONCE DAILY  Labwork:  Your physician recommends that you return for lab work in: 2 WEEKS  Follow-Up:  Your physician recommends that you schedule a follow-up appointment in: 2 [place]  If you need a refill on your cardiac medications before your next appointment, please call your pharmacy.       Signed, Shelva Majestic, MD  11/07/2016 5:07 PM    [facility] [address], [place], [place], [place]  [address] Phone: [phone]

## 2016-11-06 NOTE — Patient Instructions (Signed)
Medication Instructions:   STOP DILTIAZEM  START VALSARTAN 80 MG ONCE DAILY  Labwork:  Your physician recommends that you return for lab work in: 2 WEEKS  Follow-Up:  Your physician recommends that you schedule a follow-up appointment in: 2 [place]  If you need a refill on your cardiac medications before your next appointment, please call your pharmacy.

## 2016-11-30 DIAGNOSIS — H40013 Open angle with borderline findings, low risk, bilateral: Secondary | ICD-10-CM | POA: Diagnosis not present

## 2016-12-21 ENCOUNTER — Telehealth: Payer: Self-pay | Admitting: *Deleted

## 2016-12-21 NOTE — Telephone Encounter (Signed)
Called patient to discuss a letter received in the mail in reference to the recall of her valsartan. Husband states that she is out in the garden and not available. Informed him that I will call again tomorrow.

## 2016-12-23 ENCOUNTER — Telehealth: Payer: Self-pay | Admitting: *Deleted

## 2016-12-23 NOTE — Telephone Encounter (Signed)
Left message to return a call in reference to note received from her in reference to the valsartan recall.

## 2016-12-24 DIAGNOSIS — R300 Dysuria: Secondary | ICD-10-CM | POA: Diagnosis not present

## 2016-12-24 DIAGNOSIS — S81802A Unspecified open wound, left lower leg, initial encounter: Secondary | ICD-10-CM | POA: Diagnosis not present

## 2016-12-25 ENCOUNTER — Ambulatory Visit: Payer: Medicare Other | Admitting: Cardiovascular Disease

## 2017-03-23 ENCOUNTER — Other Ambulatory Visit: Payer: Self-pay | Admitting: Physician Assistant

## 2017-03-23 DIAGNOSIS — Z1231 Encounter for screening mammogram for malignant neoplasm of breast: Secondary | ICD-10-CM

## 2017-04-07 DIAGNOSIS — I10 Essential (primary) hypertension: Secondary | ICD-10-CM | POA: Diagnosis not present

## 2017-04-07 DIAGNOSIS — Z780 Asymptomatic menopausal state: Secondary | ICD-10-CM | POA: Diagnosis not present

## 2017-04-07 DIAGNOSIS — I350 Nonrheumatic aortic (valve) stenosis: Secondary | ICD-10-CM | POA: Diagnosis not present

## 2017-04-07 LAB — CBC AND DIFFERENTIAL
HEMATOCRIT: 48 — AB (ref 36–46)
HEMOGLOBIN: 15.8 (ref 12.0–16.0)
Platelets: 273 (ref 150–399)
WBC: 7

## 2017-04-07 LAB — BASIC METABOLIC PANEL
BUN: 27 — AB (ref 4–21)
Creatinine: 0.8 (ref 0.5–1.1)
Glucose: 99
POTASSIUM: 5.4 — AB (ref 3.4–5.3)
SODIUM: 143 (ref 137–147)

## 2017-04-07 LAB — HEPATIC FUNCTION PANEL
ALT: 14 (ref 7–35)
AST: 14 (ref 13–35)
Alkaline Phosphatase: 80 (ref 25–125)
Bilirubin, Total: 0.4

## 2017-04-07 LAB — FECAL OCCULT BLOOD, GUAIAC: Fecal Occult Blood: NEGATIVE

## 2017-04-20 ENCOUNTER — Ambulatory Visit
Admission: RE | Admit: 2017-04-20 | Discharge: 2017-04-20 | Disposition: A | Discharge status: Home / Self Care | Payer: Medicare Other | Source: Ambulatory Visit | Attending: Physician Assistant | Admitting: Physician Assistant

## 2017-04-20 DIAGNOSIS — Z1231 Encounter for screening mammogram for malignant neoplasm of breast: Secondary | ICD-10-CM

## 2017-04-30 LAB — HM MAMMOGRAPHY

## 2017-07-02 DIAGNOSIS — M19041 Primary osteoarthritis, right hand: Secondary | ICD-10-CM | POA: Diagnosis not present

## 2017-07-02 DIAGNOSIS — M19042 Primary osteoarthritis, left hand: Secondary | ICD-10-CM | POA: Diagnosis not present

## 2017-08-02 DIAGNOSIS — M5431 Sciatica, right side: Secondary | ICD-10-CM | POA: Diagnosis not present

## 2017-08-06 DIAGNOSIS — M1812 Unilateral primary osteoarthritis of first carpometacarpal joint, left hand: Secondary | ICD-10-CM | POA: Diagnosis not present

## 2017-08-06 DIAGNOSIS — M189 Osteoarthritis of first carpometacarpal joint, unspecified: Secondary | ICD-10-CM | POA: Insufficient documentation

## 2017-10-05 ENCOUNTER — Ambulatory Visit (INDEPENDENT_AMBULATORY_CARE_PROVIDER_SITE_OTHER): Payer: Medicare Other | Admitting: Family Medicine

## 2017-10-05 ENCOUNTER — Encounter: Payer: Self-pay | Admitting: Family Medicine

## 2017-10-05 VITALS — BP 150/82 | HR 70 | Temp 97.5°F | Resp 14 | Ht 64.0 in | Wt 121.0 lb

## 2017-10-05 DIAGNOSIS — N951 Menopausal and female climacteric states: Secondary | ICD-10-CM | POA: Diagnosis not present

## 2017-10-05 DIAGNOSIS — M2040 Other hammer toe(s) (acquired), unspecified foot: Secondary | ICD-10-CM

## 2017-10-05 DIAGNOSIS — R600 Localized edema: Secondary | ICD-10-CM | POA: Diagnosis not present

## 2017-10-05 DIAGNOSIS — I10 Essential (primary) hypertension: Secondary | ICD-10-CM

## 2017-10-05 DIAGNOSIS — R739 Hyperglycemia, unspecified: Secondary | ICD-10-CM | POA: Diagnosis not present

## 2017-10-05 LAB — BASIC METABOLIC PANEL
BUN: 27 mg/dL — AB (ref 6–23)
CHLORIDE: 104 meq/L (ref 96–112)
CO2: 29 mEq/L (ref 19–32)
Calcium: 9.3 mg/dL (ref 8.4–10.5)
Creatinine, Ser: 0.69 mg/dL (ref 0.40–1.20)
GFR: 87.33 mL/min (ref >60.00)
Glucose, Bld: 98 mg/dL (ref 70–99)
POTASSIUM: 4.1 meq/L (ref 3.5–5.1)
SODIUM: 141 meq/L (ref 135–145)

## 2017-10-05 LAB — HEMOGLOBIN A1C: HEMOGLOBIN A1C: 5.4 % (ref 4.6–6.5)

## 2017-10-05 MED ORDER — LOSARTAN POTASSIUM-HCTZ 50-12.5 MG PO TABS: 1.0000 | ORAL_TABLET | Freq: Every day | ORAL | 3 refills | Status: DC

## 2017-10-05 NOTE — Assessment & Plan Note (Signed)
Likely multifactorial in setting of venous insufficiency and calcium channel blocker use.  Will stop diltiazem today.  Discussed conservative measures including elevation, compression, and importance of low-salt diet.  She does not have any signs or symptoms of heart failure and her recent echo showed only grade 1 diastolic dysfunction-do not need to pursue further work-up at this point.  She will follow-up with me in a couple of weeks.

## 2017-10-05 NOTE — Assessment & Plan Note (Signed)
Check BMET and A1c.

## 2017-10-05 NOTE — Assessment & Plan Note (Addendum)
Above goal today.  Given her peripheral edema, will stop diltiazem.  We will start losartan-HCTZ 50-12.5 daily.  She will follow-up with me within the next couple of weeks.  Check BMET today.  Patient is a little unclear on what medications she is taking.  Advised her to bring all of her medication bottles to her next office visit.

## 2017-10-05 NOTE — Progress Notes (Signed)
Subjective:  Ellen [name] is a 77 y.o. female who presents today with a chief complaint of leg edema and to establish care  HPI:  Right Leg Edema, chronic problem, new to provider Started about a year ago.  Had several minor traumas to the area as well as a significant bout of cellulitis about a year ago.  Thinks that this may have caused worsening swelling to the area.  Symptoms may have worsened over the last several weeks.  She has been on Lasix in the past.  No treatments tried.  No obvious alleviating or aggravating factors.  No orthopnea.  No chest pain or shortness of breath.  No dyspnea on exertion.  Hammer Toe, chronic problem, new to provider Several year history.  Requests referral to poditrist.  Hypertension, chronic problem, new to provider Several year history.  Currently on diltiazem 120 mg daily.  Of note, patient has HCTZ and valsartan on her medication list, but patient does not think that she takes these medications.  Blood pressures usually in the 140s over 80s.  She has not tolerated beta-blockers in the past due to decreased energy.  Hot Flashes, chronic problem, new to provider Prempro twice weekly.  Has been on this for several years.  Systolic murmur, Chronic problem, new to provider Several year history.  Had echocardiogram done recently which she was told was normal.  ROS: Per HPI, otherwise a complete review of systems was negative.   PMH:  The following were reviewed and entered/updated in epic: Past Medical History:  Diagnosis Date  . Hypertension   . Kidney stones    Patient Active Problem List   Diagnosis Date Noted  . Hyperglycemia 10/05/2017  . Leg edema 10/05/2017  . 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 Hot flashes due to menopause 10/05/2017  . 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 Hypertension 07/09/2013   Past Surgical History:  Procedure Laterality Date  . BOWEL RESECTION  07/09/2013   Procedure: SMALL BOWEL RESECTION;  Surgeon: Earnstine Regal, MD;  Location: [facility];  Service: General;;  . LAPAROTOMY N/A 07/09/2013   Procedure: EXPLORATORY LAPAROTOMY;  Surgeon: Earnstine Regal, MD;  Location: [facility];  Service: General;  Laterality: N/A;    Family History  Problem Relation Age of Onset  . Diabetes Mother   . Heart attack Mother   . CAD Father   . Heart attack Father     Medications- reviewed and updated Current Outpatient Medications  Medication Sig Dispense Refill  . furosemide (LASIX) 40 MG tablet Take 40 mg by mouth.    . naproxen sodium (ALEVE) 220 MG tablet Aleve 220 mg tablet   1 tablet every day by oral route.    Marland Kitchen estrogen, conjugated,-medroxyprogesterone (PREMPRO) 0.45-1.5 MG per tablet Take 1 tablet by mouth continuous. Every third day    . losartan-hydrochlorothiazide (HYZAAR) 50-12.5 MG tablet Take 1 tablet by mouth daily. 90 tablet 3  . Omega-3 Fatty Acids (FISH OIL) 500 MG CAPS Take 1 capsule by mouth every other day.     No current facility-administered medications for this visit.     Allergies-reviewed and updated Allergies  Allergen Reactions  . Lactose Intolerance (Gi) Diarrhea  . Sulfa Antibiotics Nausea And Vomiting and Swelling    Social History   Socioeconomic History  . Marital status: Married    Spouse name: Not on file  . Number of children: Not on file  . Years of education: Not on file  .  Highest education level: Not on file  Occupational History  . Not on file  Social Needs  . Financial resource strain: Not on file  . Food insecurity:    Worry: Not on file    Inability: Not on file  . Transportation needs:    Medical: Not on file    Non-medical: Not on file  Tobacco Use  . Smoking status: Former Research scientist (life sciences)  . Smokeless tobacco: Never Used  Substance and Sexual Activity  . Alcohol use: Yes    Comment: wine  . Drug use: No  . Sexual activity: Not on file  Lifestyle  . Physical activity:    Days per week: Not on file     Minutes per session: Not on file  . Stress: Not on file  Relationships  . Social connections:    Talks on phone: Not on file    Gets together: Not on file    Attends religious service: Not on file    Active member of club or organization: Not on file    Attends meetings of clubs or organizations: Not on file    Relationship status: Not on file  Other Topics Concern  . Not on file  Social History Narrative  . Not on file     Objective:  Physical Exam: BP (!) 150/82   Pulse 70   Temp (!) 97.5 F (36.4 C) (Oral)   Resp 14   Ht 5\' 4"  (1.626 m)   Wt 121 lb (54.9 kg)   SpO2 96%   BMI 20.77 kg/m   Gen: NAD, resting comfortably CV: RRR with 2 out of 6 systolic murmur appreciated Pulm: NWOB, CTAB with no crackles, wheezes, or rhonchi GI: Normal bowel sounds present. Soft, Nontender, Nondistended. MSK: 1+ pitting edema to mid tibia bilaterally.  Several varicosities noted. Skin: Warm, dry Neuro: Grossly normal, moves all extremities Psych: Normal affect and thought content  Assessment/Plan:  Leg edema Likely multifactorial in setting of venous insufficiency and calcium channel blocker use.  Will stop diltiazem today.  Discussed conservative measures including elevation, compression, and importance of low-salt diet.  She does not have any signs or symptoms of heart failure and her recent echo showed only grade 1 diastolic dysfunction-do not need to pursue further work-up at this point.  She will follow-up with me in a couple of weeks.  Hypertension Above goal today.  Given her peripheral edema, will stop diltiazem.  We will start losartan-HCTZ 50-12.5 daily.  She will follow-up with me within the next couple of weeks.  Check BMET today.  Patient is a little unclear on what medications she is taking.  Advised her to bring all of her medication bottles to her next office visit.  Hyperglycemia Check BMET and A1c.  Hammer toe Referral to podiatry placed.  Hot flashes due to  menopause Currently on Prempro for this.  Discussed increased risk of cardiovascular disease, cerebrovascular disease, VTE, uterine cancer, breast cancer, and ovarian cancer.  Preventative healthcare Obtain records from previous PCP.  Algis Greenhouse. Jerline Pain, MD 10/05/2017 3:51 PM

## 2017-10-05 NOTE — Assessment & Plan Note (Signed)
Referral to podiatry placed

## 2017-10-05 NOTE — Assessment & Plan Note (Signed)
Currently on Prempro for this.  Discussed increased risk of cardiovascular disease, cerebrovascular disease, VTE, uterine cancer, breast cancer, and ovarian cancer.

## 2017-10-05 NOTE — Patient Instructions (Signed)
It was very nice to meet you today!  Please stop the diltiazem. Start the new blood pressure medication.  We will check blood work today.  Please keep your legs elevated and try to limit yourself to no more than 1800mg  of sodium daily.  Come back to see me in 1-2 weeks, or sooner as needed.   Take care, Dr Jerline Pain

## 2017-10-18 ENCOUNTER — Encounter: Payer: Self-pay | Admitting: Podiatry

## 2017-10-18 ENCOUNTER — Ambulatory Visit (INDEPENDENT_AMBULATORY_CARE_PROVIDER_SITE_OTHER): Payer: Medicare Other | Admitting: Podiatry

## 2017-10-18 ENCOUNTER — Ambulatory Visit (INDEPENDENT_AMBULATORY_CARE_PROVIDER_SITE_OTHER): Payer: Medicare Other

## 2017-10-18 VITALS — BP 144/79 | HR 72

## 2017-10-18 DIAGNOSIS — M2041 Other hammer toe(s) (acquired), right foot: Secondary | ICD-10-CM | POA: Diagnosis not present

## 2017-10-18 DIAGNOSIS — M2042 Other hammer toe(s) (acquired), left foot: Secondary | ICD-10-CM | POA: Diagnosis not present

## 2017-10-18 DIAGNOSIS — M779 Enthesopathy, unspecified: Secondary | ICD-10-CM

## 2017-10-18 DIAGNOSIS — L84 Corns and callosities: Secondary | ICD-10-CM | POA: Diagnosis not present

## 2017-10-18 MED ORDER — TRIAMCINOLONE ACETONIDE 10 MG/ML IJ SUSP
10.0000 mg | Freq: Once | INTRAMUSCULAR | Status: AC
  Administered 2017-10-18: 10 mg

## 2017-10-18 NOTE — Patient Instructions (Signed)
Hammer Toe Hammer toe is a change in the shape (a deformity) of your second, third, or fourth toe. The deformity causes the middle joint of your toe to stay bent. This causes pain, especially when you are wearing shoes. Hammer toe starts gradually. At first, the toe can be straightened. Gradually over time, the deformity becomes stiff and permanent. Early treatments to keep the toe straight may relieve pain. As the deformity becomes stiff and permanent, surgery may be needed to straighten the toe. What are the causes? Hammer toe is caused by abnormal bending of the toe joint that is closest to your foot. It happens gradually over time. This pulls on the muscles and connections (tendons) of the toe joint, making them weak and stiff. It is often related to wearing shoes that are too short or narrow and do not let your toes straighten. What increases the risk? You may be at greater risk for hammer toe if you:  Are female.  Are older.  Wear shoes that are too small.  Wear high-heeled shoes that pinch your toes.  Are a ballet dancer.  Have a second toe that is longer than your big toe (first toe).  Injure your foot or toe.  Have arthritis.  Have a family history of hammer toe.  Have a nerve or muscle disorder.  What are the signs or symptoms? The main symptoms of this condition are pain and deformity of the toe. The pain is worse when wearing shoes, walking, or running. Other symptoms may include:  Corns or calluses over the bent part of the toe or between the toes.  Redness and a burning feeling on the toe.  An open sore that forms on the top of the toe.  Not being able to straighten the toe.  How is this diagnosed? This condition is diagnosed based on your symptoms and a physical exam. During the exam, your health care provider will try to straighten your toe to see how stiff the deformity is. You may also have tests, such as:  A blood test to check for rheumatoid  arthritis.  An X-ray to show how severe the deformity is.  How is this treated? Treatment for this condition will depend on how stiff the deformity is. Surgery is often needed. However, sometimes a hammer toe can be straightened without surgery. Treatments that do not involve surgery include:  Taping the toe into a straightened position.  Using pads and cushions to protect the toe (orthotics).  Wearing shoes that provide enough room for the toes.  Doing toe-stretching exercises at home.  Taking an NSAID to reduce pain and swelling.  If these treatments do not help or the toe cannot be straightened, surgery is the next option. The most common surgeries used to straighten a hammer toe include:  Arthroplasty. In this procedure, part of the joint is removed, and that allows the toe to straighten.  Fusion. In this procedure, cartilage between the two bones of the joint is taken out and the bones are fused together into one longer bone.  Implantation. In this procedure, part of the bone is removed and replaced with an implant to let the toe move again.  Flexor tendon transfer. In this procedure, the tendons that curl the toes down (flexor tendons) are repositioned.  Follow these instructions at home:  Take over-the-counter and prescription medicines only as told by your health care provider.  Do toe straightening and stretching exercises as told by your health care provider.  Keep all   follow-up visits as told by your health care provider. This is important. How is this prevented?  Wear shoes that give your toes enough room and do not cause pain.  Do not wear high-heeled shoes. Contact a health care provider if:  Your pain gets worse.  Your toe becomes red or swollen.  You develop an open sore on your toe. This information is not intended to replace advice given to you by your health care provider. Make sure you discuss any questions you have with your health care  provider. Document Released: 05/01/2000 Document Revised: 11/22/2015 Document Reviewed: 08/28/2015 Elsevier Interactive Patient Education  2018 [employer] Inc[employer]  

## 2017-10-18 NOTE — Progress Notes (Signed)
Subjective:   Patient ID: Ellen [name], female   DOB: 77 y.o.   MRN: [id number]   HPI Patient presents with severe foot structural issues bilateral and states she is getting a painful lesion between the big toe and second toe of the left foot   Review of Systems  All other systems reviewed and are negative.       Objective:  Physical Exam  Constitutional: She appears well-developed and well-nourished.  Cardiovascular: Intact distal pulses.  Pulmonary/Chest: Effort normal.  Musculoskeletal: Normal range of motion.  Neurological: She is alert.  Skin: Skin is warm.  Nursing note and vitals reviewed.   With large hyperostosis of the medial dorsal aspect of the first metatarsal that is painful and is noted to have inflammatory lesion on the inside of the hallux left that is painful when palpated with deviation of the second toe against the big toe left foot.  Patient has good digital perfusion well oriented x3 neurovascular status intact muscle strength adequate with patient noted to have significant digital deformity digit to bilateral     Assessment:  Inflammatory inner phalangeal joint capsulitis hallux left foot with keratotic lesion and structural bunion deformity right over left     Plan:  H&P all conditions reviewed x-rays reviewed with patient and today I went ahead and I did a careful injection of the inner phalangeal joint left lateral joint 3 mg Dexasone Kenalog 5 mg Xylocaine debrided the lesion on the hallux and patient will be seen back to repeat check again with padding applied.  Do not recommend surgery for remaining deformity  X-ray indicates the patient has significant structural malalignment of both feet with no other pathology noted

## 2017-10-19 ENCOUNTER — Encounter: Payer: Self-pay | Admitting: Family Medicine

## 2017-10-19 ENCOUNTER — Ambulatory Visit (INDEPENDENT_AMBULATORY_CARE_PROVIDER_SITE_OTHER): Payer: Medicare Other | Admitting: Family Medicine

## 2017-10-19 DIAGNOSIS — I10 Essential (primary) hypertension: Secondary | ICD-10-CM

## 2017-10-19 DIAGNOSIS — M6208 Separation of muscle (nontraumatic), other site: Secondary | ICD-10-CM

## 2017-10-19 DIAGNOSIS — R600 Localized edema: Secondary | ICD-10-CM

## 2017-10-19 NOTE — Assessment & Plan Note (Signed)
Patient's abdominal bulge likely secondary to rectus diastases.  Her surgical scar is intact without any obvious signs of large hernia.  She does not have any red flag signs or symptoms.  We will proceed with conservative management including home exercise program focusing on strengthening rectus abdominis muscles.  Discussed reasons to return to care.

## 2017-10-19 NOTE — Assessment & Plan Note (Signed)
At goal per JNC 8 guidelines.  Continue Hyzaar.  Follow-up at next office visit.

## 2017-10-19 NOTE — Assessment & Plan Note (Signed)
Significantly improved today.  Will take Lasix off of her med list.  Continue blood pressure treatment as noted above.

## 2017-10-19 NOTE — Patient Instructions (Signed)
It was very nice to see you today.  Your blood pressure looks much better. We will not make any additional med changes today.  Please work on the exercises.  Come back soon for your annual wellness visit.   Take care, Dr Jerline Pain   2019 [employer]. and/or its affiliates. All Rights Reserved. Rectus abdominis diastasis exercises    Illustration of the exercises to strengthen the rectus abdominis muscles. These were performed by the intervention group: (A) draw-in ("on all fours"), (B) draw-in (prone), (C) half-plank, (D) side-plank, (E) oblique sit-up, (F) straight sit-up. From: Cherlyn Roberts, Hilde G, [place] MK, et al. Effect of a postpartum training program on the prevalence of diastasis recti abdominis in postpartum primiparous women: A randomized controlled trial. Phys Ther 2018; 98:260. Copyright  2018 Katharine Look L. [place]uppe. Reproduced with permission. Graphic [phone] Version 2.0

## 2017-10-19 NOTE — Progress Notes (Signed)
   Subjective:  Ellen [name] is a 77 y.o. female who presents today with a chief complaint of hypertension follow up.   HPI:  Hypertension, established problem, Stable BP Readings from Last 3 Encounters:  10/19/17 (!) 148/80  10/18/17 (!) 144/79  10/05/17 (!) 150/82  Patient seen initially couple weeks ago for this.  At that time blood pressure was uncontrolled.  We stopped her diltiazem due to lower extremity swelling and started her on losartan-HCTZ 50-12.51 tablet daily.  She has done well with this over the last couple of weeks.  No chest [name] or shortness of breath..  Leg Edema, established problem, improved Patient also seen a couple weeks ago for this.  As noted above, we stopped her diltiazem.  She had significant improvement in her spine since her last visit.  She is no longer taking Lasix.  Abdominal bulge, new problem Several year history.  Thinks it is related to her abdominal surgery and subsequent scar.  Symptoms have been stable.  No reported nausea or vomiting.  No reported abdominal [name].  She avoids heavy lifting.  ROS: Per HPI  PMH: She reports that she has quit smoking. She has never used smokeless tobacco. She reports that she drinks alcohol. She reports that she does not use drugs.   Objective:  Physical Exam: BP (!) 148/80 (BP Location: Left Arm, Patient Position: Sitting, Cuff Size: Normal)   Pulse 68   Temp (!) 97.4 F (36.3 C) (Oral)   Ht 5\' 4"  (1.626 m)   Wt 120 lb 9.6 oz (54.7 kg)   SpO2 98%   BMI 20.70 kg/m   Gen: NAD, resting comfortably CV: RRR with no murmurs appreciated Pulm: NWOB, CTAB with no crackles, wheezes, or rhonchi GI: Normal bowel sounds present.  Midline surgical scar noted.  Midline bulge noted with crunch.  No appreciable hernias.  Soft, nontender, nondistended.  Assessment/Plan:  Hypertension At goal per JNC 8 guidelines.  Continue Hyzaar.  Follow-up at next office visit.  Leg edema Significantly improved today.  Will  take Lasix off of her med list.  Continue blood pressure treatment as noted above.  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  Her surgical scar is intact without any obvious signs of large hernia.  She does not have any red flag signs or symptoms.  We will proceed with conservative management including home exercise program focusing on strengthening rectus abdominis muscles.  Discussed reasons to return to care.  Preventative healthcare Patient will follow-up soon for her AWV.  Algis Greenhouse. Ellen Pain, MD 10/19/2017 10:56 AM

## 2017-10-20 ENCOUNTER — Encounter: Payer: Self-pay | Admitting: Family Medicine

## 2018-04-12 ENCOUNTER — Other Ambulatory Visit: Payer: Self-pay | Admitting: Family Medicine

## 2018-04-12 DIAGNOSIS — Z1231 Encounter for screening mammogram for malignant neoplasm of breast: Secondary | ICD-10-CM

## 2018-05-10 ENCOUNTER — Ambulatory Visit
Admission: RE | Admit: 2018-05-10 | Discharge: 2018-05-10 | Disposition: A | Discharge status: Home / Self Care | Payer: Medicare Other | Source: Ambulatory Visit | Attending: Family Medicine | Admitting: Family Medicine

## 2018-05-10 DIAGNOSIS — Z1231 Encounter for screening mammogram for malignant neoplasm of breast: Secondary | ICD-10-CM | POA: Diagnosis not present

## 2018-06-14 DIAGNOSIS — H3589 Other specified retinal disorders: Secondary | ICD-10-CM | POA: Diagnosis not present

## 2018-07-25 ENCOUNTER — Encounter: Payer: Self-pay | Admitting: Family Medicine

## 2018-07-25 ENCOUNTER — Ambulatory Visit (INDEPENDENT_AMBULATORY_CARE_PROVIDER_SITE_OTHER): Payer: Medicare Other | Admitting: Family Medicine

## 2018-07-25 VITALS — BP 142/84 | HR 77 | Temp 98.2°F | Ht 64.0 in | Wt 124.8 lb

## 2018-07-25 DIAGNOSIS — S46812A Strain of other muscles, fascia and tendons at shoulder and upper arm level, left arm, initial encounter: Secondary | ICD-10-CM | POA: Diagnosis not present

## 2018-07-25 DIAGNOSIS — I10 Essential (primary) hypertension: Secondary | ICD-10-CM

## 2018-07-25 DIAGNOSIS — N951 Menopausal and female climacteric states: Secondary | ICD-10-CM | POA: Diagnosis not present

## 2018-07-25 DIAGNOSIS — R011 Cardiac murmur, unspecified: Secondary | ICD-10-CM | POA: Insufficient documentation

## 2018-07-25 DIAGNOSIS — Z23 Encounter for immunization: Secondary | ICD-10-CM

## 2018-07-25 MED ORDER — LOSARTAN POTASSIUM-HCTZ 50-12.5 MG PO TABS: 1.0000 | ORAL_TABLET | Freq: Every day | ORAL | 3 refills | Status: DC

## 2018-07-25 NOTE — Assessment & Plan Note (Signed)
Discussed potential complications of Prempro.  She wishes to come off this medication as she does not think it has been very effective.  Discussed options including Celexa or other antidepressant.  She wishes to try over-the-counter medications at this point.  Recommended black cohosh as an alternative and discussed the fact that this did not have great evidence.  Patient voiced understanding.

## 2018-07-25 NOTE — Assessment & Plan Note (Signed)
No red flags.  Continue with watchful waiting.

## 2018-07-25 NOTE — Patient Instructions (Addendum)
It was very nice to see you today!  We will give your flu shot today.  You have a strain of your deltoid.  Please work on exercises.  Please take naproxen twice daily for the next 1 to 2 weeks.  Please try taking black cohosh for her menopausal symptoms.  Let us know if your symptoms worsen or not improve in the next 1 to 2 weeks.  Take care, Dr Jerline Pain

## 2018-07-25 NOTE — Progress Notes (Signed)
   Chief Complaint:  Ellen [name] is a 78 y.o. female who presents today with a chief complaint of left arm pain.   Assessment/Plan:  Left Deltoid Injury No red flags.  Likely contusion/sprain.  Discussed home exercise program and handout was given.  She will take naproxen 220 mg twice daily for the next 1 to 2 weeks.  Discussed reasons to return to care.  Follow-up as needed.  Systolic murmur No red flags.  Continue with watchful waiting.  Hot flashes due to menopause Discussed potential complications of Prempro.  She wishes to come off this medication as she does not think it has been very effective.  Discussed options including Celexa or other antidepressant.  She wishes to try over-the-counter medications at this point.  Recommended black cohosh as an alternative and discussed the fact that this did not have great evidence.  Patient voiced understanding.  Hypertension At goal per JNC 8 guidelines.  Continue losartan-HCTZ 50-12.5 mg once daily.  Preventive health care  Flu shot given today.    Subjective:  HPI:  Left Arm Pain About 2 months ago.  Patient unfortunately was sitting in her recliner when she slid out and landed on her left shoulder.  Since then she has had pain in her left shoulder with a cold sensation throughout her left arm.  No weakness.  No numbness.  No pins-and-needles.  Symptoms are overall stable.  No specific treatments tried.  No obvious alleviating or aggravating factors.  Her chronic medical conditions are outlined below:  # Essential Hypertension  - On losartan-HCTZ 50-12.5mg  tablet once daily and tolerating well -ROS: No reported chest pain or shortness of breath.  # Menopausal Symptoms - On prempro but weaning off. Currently taking 3 times per week.   ROS: Per HPI  PMH: She reports that she has quit smoking. She has never used smokeless tobacco. She reports current alcohol use. She reports that she does not use drugs.        Objective:  Physical Exam: BP (!) 142/84 (BP Location: Left Arm, Patient Position: Sitting, Cuff Size: Normal)   Pulse 77   Temp 98.2 F (36.8 C) (Oral)   Ht 5\' 4"  (1.626 m)   Wt 124 lb 12.8 oz (56.6 kg)   SpO2 95%   BMI 21.42 kg/m   Gen: NAD, resting comfortably CV: Regular rate and rhythm with 2/6 systolic murmur noted Pulm: Normal work of breathing, clear to auscultation bilaterally with no crackles, wheezes, or rhonchi MSK: -Left arm: No deformities.  Rotator cuff strength testing intact.  Pain with abduction of the left shoulder.  Tender to palpation along base of left deltoid.  Neurovascular intact distally. -NecK: No deformities.  Full range of motion.  Spurling negative. Skin: Warm, dry       M. Jerline Pain, MD 07/25/2018 2:44 PM

## 2018-07-25 NOTE — Assessment & Plan Note (Signed)
At goal per JNC 8 guidelines.  Continue losartan-HCTZ 50-12.5 mg once daily.

## 2018-07-27 ENCOUNTER — Other Ambulatory Visit: Payer: Self-pay

## 2018-07-27 MED ORDER — HYDROCHLOROTHIAZIDE 12.5 MG PO CAPS: 12.5000 mg | ORAL_CAPSULE | Freq: Every day | ORAL | 1 refills | Status: DC

## 2018-07-27 MED ORDER — LOSARTAN POTASSIUM 50 MG PO TABS: 50.0000 mg | ORAL_TABLET | Freq: Every day | ORAL | 1 refills | Status: DC

## 2018-08-08 ENCOUNTER — Encounter: Payer: Self-pay | Admitting: Family Medicine

## 2018-08-08 ENCOUNTER — Ambulatory Visit (INDEPENDENT_AMBULATORY_CARE_PROVIDER_SITE_OTHER): Payer: Medicare Other | Admitting: Family Medicine

## 2018-08-09 NOTE — Progress Notes (Signed)
Erroneous encounter. Please disregard.  Algis Greenhouse. Jerline Pain, MD 08/09/2018 8:03 AM

## 2018-10-24 ENCOUNTER — Encounter: Payer: Self-pay | Admitting: Family Medicine

## 2018-10-24 ENCOUNTER — Ambulatory Visit (INDEPENDENT_AMBULATORY_CARE_PROVIDER_SITE_OTHER): Payer: Medicare Other | Admitting: Family Medicine

## 2018-10-24 VITALS — Ht 64.0 in

## 2018-10-24 DIAGNOSIS — N951 Menopausal and female climacteric states: Secondary | ICD-10-CM

## 2018-10-24 DIAGNOSIS — M79652 Pain in left thigh: Secondary | ICD-10-CM

## 2018-10-24 MED ORDER — CITALOPRAM HYDROBROMIDE 20 MG PO TABS: 20.0000 mg | ORAL_TABLET | Freq: Every day | ORAL | 3 refills | Status: DC

## 2018-10-24 NOTE — Assessment & Plan Note (Signed)
Discussed treatment options.  She is interested in going back on Prempro, however discussed potential side effects of hormone replacement including increased risk of heart attack, stroke, and certain forms of cancer.  She is interested in trying other options.  Will start Celexa today.  Discussed potential side effects.  She will follow-up with me later this week.

## 2018-10-24 NOTE — Progress Notes (Signed)
    Chief Complaint:  Ellen [name] is a 78 y.o. female who presents today for a virtual office visit with a chief complaint of left leg pain.   Assessment/Plan:  Left leg pain History more consistent with muscular strain.  She points to her anterior thigh this most tender point.  Advised her to follow-up soon for in person office visit for further evaluation.  May need x-ray at that time given symptoms been persistent for 2 months.  Hot flashes due to menopause Discussed treatment options.  She is interested in going back on Prempro, however discussed potential side effects of hormone replacement including increased risk of heart attack, stroke, and certain forms of cancer.  She is interested in trying other options.  Will start Celexa today.  Discussed potential side effects.  She will follow-up with me later this week.    Subjective:  HPI:  Left Leg Pain Started a few months ago.  Located in the left upper leg.  She fell out of recliner a few months ago and thinks that this is contributed to it.  Symptoms are much worse when laying in bed and laying on that side.  Also worse with walking for the first hour after waking up.  During the day symptoms are mostly manageable.  She has tried heating to the area but has not helped.  No other treatments tried.  No weakness or numbness.  No other obvious alleviating or aggravating factors.  Hot flashes secondary to menopause Several year history.  She stopped her estrogen replacement therapy about 3 months ago and tried over-the-counter black cohosh.  Unfortunately this did not work she has had worsening hot flashes and mood swings.  She is interested in trying another medication today.  ROS: Per HPI  PMH: She reports that she has quit smoking. She has never used smokeless tobacco. She reports current alcohol use. She reports that she does not use drugs.      Objective/Observations  Physical Exam: Gen: NAD, resting comfortably  Pulm: Normal work of breathing Neuro: Grossly normal, moves all extremities Psych: Normal affect and thought content  Virtual Visit via Video   I connected with Ellen [name] on 10/24/18 at 10:20 AM EDT by a video enabled telemedicine application and verified that I am speaking with the correct person using two identifiers. I discussed the limitations of evaluation and management by telemedicine and the availability of in person appointments. The patient expressed understanding and agreed to proceed.   Patient location: Home Provider location: [place] participating in the virtual visit: Myself and Patient     Algis Greenhouse. Jerline Pain, MD 10/24/2018 10:36 AM

## 2018-10-27 ENCOUNTER — Other Ambulatory Visit: Payer: Self-pay

## 2018-10-27 ENCOUNTER — Ambulatory Visit (INDEPENDENT_AMBULATORY_CARE_PROVIDER_SITE_OTHER): Payer: Medicare Other

## 2018-10-27 ENCOUNTER — Ambulatory Visit (INDEPENDENT_AMBULATORY_CARE_PROVIDER_SITE_OTHER): Payer: Medicare Other | Admitting: Family Medicine

## 2018-10-27 ENCOUNTER — Encounter: Payer: Self-pay | Admitting: Family Medicine

## 2018-10-27 VITALS — BP 144/76 | HR 64 | Temp 98.0°F | Ht 64.0 in | Wt 120.8 lb

## 2018-10-27 DIAGNOSIS — M25552 Pain in left hip: Secondary | ICD-10-CM

## 2018-10-27 DIAGNOSIS — M1612 Unilateral primary osteoarthritis, left hip: Secondary | ICD-10-CM | POA: Diagnosis not present

## 2018-10-27 MED ORDER — DICLOFENAC SODIUM 75 MG PO TBEC: 75.0000 mg | DELAYED_RELEASE_TABLET | Freq: Two times a day (BID) | ORAL | 0 refills | Status: DC

## 2018-10-27 NOTE — Patient Instructions (Signed)
It was very nice to see you today!  I think you have a strain in your hip flexor and hip abductor muscles.  Please work on exercises.  Please take the diclofenac twice daily for the next 1 to 2 weeks.  Let me know if your symptoms do not improve.  Take care, Dr Jerline Pain

## 2018-10-27 NOTE — Progress Notes (Signed)
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 Would like for her to continue the medications and exercises and let us know if not improving.   Ellen [name]. Jerline Pain, MD 10/27/2018 4:03 PM

## 2018-10-27 NOTE — Progress Notes (Signed)
   Chief Complaint:  Ericha Whittingham is a 78 y.o. female who presents today with a chief complaint of left leg pain.   Assessment/Plan:  Left leg pain No red flags.  Exam consistent with hip flexor and hip abductor strain.  Discussed home exercise program.  Also start course of diclofenac.  Plain films today did not show any significant abnormalities based on my read-will await radiology read.  Discussed reasons to return to care.  Consider referral to PT and/or sports medicine if no improvement with above.    Subjective:  HPI:  Left Leg Pain   Had virtual visit 3 days ago. Located in left upper leg after suffering a fall a few months ago. Symptoms worse when laying in bed and laying on her side. Worse with walking for the first hour. Heat has not helped.  Symptoms are overall stable however uncontrolled.  ROS: Per HPI  PMH: She reports that she has quit smoking. She has never used smokeless tobacco. She reports current alcohol use. She reports that she does not use drugs.      Objective:  Physical Exam: BP (!) 144/76 (BP Location: Left Arm, Patient Position: Sitting, Cuff Size: Normal)   Pulse 64   Temp 98 F (36.7 C) (Oral)   Ht 5\' 4"  (1.626 m)   Wt 120 lb 12 oz (54.8 kg)   BMI 20.73 kg/m   Gen: NAD, resting comfortably MSK: -Left leg: No deformities.  Full range of motion.  Tender palpation along the lateral aspect of thigh.  She has some weakness and pain with resisted hip flexion and resisted hip abduction.  Neurovascular intact distally.      Algis Greenhouse. Jerline Pain, MD 10/27/2018 9:14 AM

## 2018-11-17 ENCOUNTER — Other Ambulatory Visit: Payer: Self-pay

## 2018-11-17 ENCOUNTER — Telehealth: Payer: Self-pay | Admitting: Family Medicine

## 2018-11-17 MED ORDER — PREMPRO 0.45-1.5 MG PO TABS: 1.0000 | ORAL_TABLET | Freq: Every day | ORAL | 11 refills | Status: DC

## 2018-11-17 NOTE — Telephone Encounter (Signed)
Please advise 

## 2018-11-17 NOTE — Telephone Encounter (Signed)
Pt stated that her Celexa is not working with Hot flashes and she would like to go back on Pempro. Please advise.

## 2018-11-17 NOTE — Telephone Encounter (Signed)
Ok with switching back. Please make sure pt is taking celexa daily. If so, can refill her prempro.  Algis Greenhouse. Jerline Pain, MD 11/17/2018 2:11 PM

## 2018-11-18 NOTE — Telephone Encounter (Signed)
Pt called back.  States she doesn't know what Celexa is and is very confused about the message that she got yesterday regarding Prempro.

## 2018-11-21 NOTE — Telephone Encounter (Signed)
See note

## 2018-11-22 NOTE — Telephone Encounter (Signed)
LM to return call.  Patient is taking Celexa (citalopram).  She called to state it is not working for her hot flashes.  She needs to continue this medication and not stop taking it "cold [place]".  She may also start the Prempro that has been sent in to her pharmacy.  CRM placed.  PEC, please tell patient this information if she calls back.

## 2018-11-23 NOTE — Telephone Encounter (Signed)
Patient stated she stop taking Celexa for her hot flashes as of 11/22/18.She started back taking Prempro and she feels better. FYI

## 2018-11-23 NOTE — Progress Notes (Signed)
This encounter was created in error - please disregard.

## 2019-01-05 ENCOUNTER — Ambulatory Visit (INDEPENDENT_AMBULATORY_CARE_PROVIDER_SITE_OTHER): Payer: Medicare Other | Admitting: Family Medicine

## 2019-01-05 ENCOUNTER — Other Ambulatory Visit: Payer: Self-pay

## 2019-01-05 ENCOUNTER — Encounter: Payer: Self-pay | Admitting: Family Medicine

## 2019-01-05 VITALS — BP 128/84 | HR 64 | Temp 97.9°F | Ht 64.0 in | Wt 121.4 lb

## 2019-01-05 DIAGNOSIS — K5790 Diverticulosis of intestine, part unspecified, without perforation or abscess without bleeding: Secondary | ICD-10-CM | POA: Diagnosis not present

## 2019-01-05 DIAGNOSIS — L905 Scar conditions and fibrosis of skin: Secondary | ICD-10-CM

## 2019-01-05 DIAGNOSIS — L309 Dermatitis, unspecified: Secondary | ICD-10-CM | POA: Diagnosis not present

## 2019-01-05 MED ORDER — TRIAMCINOLONE ACETONIDE 0.5 % EX CREA: TOPICAL_CREAM | CUTANEOUS | 0 refills | Status: DC

## 2019-01-05 NOTE — Assessment & Plan Note (Signed)
Secondary to xerosis cutis.  Start triamcinolone.  Continue topical emollient.  Discussed reasons return to care.  Follow-up as needed.

## 2019-01-05 NOTE — Progress Notes (Signed)
   Chief Complaint:  Ellen [name] is a 78 y.o. female who presents today with a chief complaint of scarring.   Assessment/Plan:  Scarring Discussed treatment options including steroid injections versus plastic surgery referral.  Will place referral to plastic surgery.  Abdominal pain No red flags.  Likely secondary to mild to light his flares.  Encouraged that she stay well-hydrated keep diet high in fiber.  It is also possible she could be having gas pains-recommended over-the-counter simethicone as needed.  Discussed reasons to return to care.  Follow-up as needed.  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  Start triamcinolone.  Continue topical emollient.  Discussed reasons return to care.  Follow-up as needed.    Subjective:  HPI:  Scaring Patient underwent laparotomy with small bowel resection about 5 years ago.  She has large midline abdominal scar related to this.  Over the last several months she has noticed the scar increasing in size and becoming more irritated.  She has not noticed any obvious precipitating or aggravating factors.  No specific treatments tried.  She would like to discuss options to get this reduced in size.  She is also had occasional left-sided abdominal pain.  This happens about once or twice per month.  Usually lasts for 15 to 20 minutes and then subsides.  She is worried about possibly flaring up diverticulosis.  No obvious triggering events.  No reported melena or hematochezia.  No diarrhea.  No constipation.  She has bowel movement nearly every day which is soft in consistency.  She has been eating a lot of vegetables.  She takes activity and yogurt.  No other treatments tried.  Patient is also had some irritation on her lower legs.  This is been going on for several years.  She tries lotion which does not seem to help.  She has frequent skin flaking and peeling to the area.  She also has several areas of irritation and inflammation with flaking.   ROS: Per HPI  PMH: She reports that she has quit smoking. She has never used smokeless tobacco. She reports current alcohol use. She reports that she does not use drugs.      Objective:  Physical Exam: BP 128/84   Pulse 64   Temp 97.9 F (36.6 C)   Ht 5\' 4"  (1.626 m)   Wt 121 lb 6.1 oz (55.1 kg)   SpO2 98%   BMI 20.83 kg/m   Gen: NAD, resting comfortably GI: Rectus diastases noted.  Large midline surgical scar noted with nodular masses at inferior aspect.  Normal bowel sounds.  Soft, nontender, and nondistended. Skin: Bilateral lower extremities with signs of postinflammatory hyperpigmentation and several areas of erythema and xerosis.     Algis Greenhouse. Jerline Pain, MD 01/05/2019 10:13 AM

## 2019-01-05 NOTE — Patient Instructions (Signed)
It was very nice to see you today!  I will place a referral for you to see plastic surgery.  Please make sure that you are getting plenty of fluids.  You can also take over-the-counter simethicone as needed for abdominal pain.  Please try the triamcinolone this with lotion for your legs.  Take care, Dr Jerline Pain  Please try these tips to maintain a healthy lifestyle:   Eat at least 3 REAL meals and 1-2 snacks per day.  Aim for no more than 5 hours between eating.  If you eat breakfast, please do so within one hour of getting up.    Obtain twice as many fruits/vegetables as protein or carbohydrate foods for both lunch and dinner. (Half of each meal should be fruits/vegetables, one quarter protein, and one quarter starchy carbs)   Cut down on sweet beverages. This includes juice, soda, and sweet tea.    Exercise at least 150 minutes every week.

## 2019-01-21 ENCOUNTER — Other Ambulatory Visit: Payer: Self-pay | Admitting: Family Medicine

## 2019-02-07 ENCOUNTER — Ambulatory Visit (INDEPENDENT_AMBULATORY_CARE_PROVIDER_SITE_OTHER): Payer: Medicare Other | Admitting: Plastic Surgery

## 2019-02-07 ENCOUNTER — Other Ambulatory Visit: Payer: Self-pay

## 2019-02-07 ENCOUNTER — Encounter: Payer: Self-pay | Admitting: Plastic Surgery

## 2019-02-07 VITALS — BP 167/74 | HR 70 | Temp 97.3°F | Ht 64.0 in | Wt 120.0 lb

## 2019-02-07 DIAGNOSIS — M6208 Separation of muscle (nontraumatic), other site: Secondary | ICD-10-CM

## 2019-02-07 NOTE — Progress Notes (Signed)
Patient ID: Ellen [name], female    DOB: February 11, 1941, 78 y.o.   MRN: [id number]   Chief Complaint  Patient presents with  . Skin Problem    The patient is a 78 year old white female here for evaluation of her abdomen.  5 years ago she underwent a bowel resection for peritonitis.  She got home from church and had terrible doubling over pain.  The next thing she knew she was on the operating room table.  She does not remember who did it but it was done at [facility].  Since then she has noticed increasing swelling of her abdomen.  On exam she has about 8 cm of rectus diastases.  Possibly a hernia at the superior portion of the abdomen.  Its not tender.  It does not appear to be anything of concern.  She is not overly concerned about it either she just wanted to be sure that there was not something she should do for it.  She is an avid gardener.  She is very active.  She is not interested in pursuing any kind of detailed surgery.  I think that is very wise.   Review of Systems  Constitutional: Negative for activity change and appetite change.  HENT: Negative for dental problem.   Eyes: Negative.   Respiratory: Negative for chest tightness and shortness of breath.   Cardiovascular: Positive for leg swelling.  Gastrointestinal: Positive for abdominal distention and abdominal pain.  Endocrine: Negative.   Genitourinary: Negative.   Musculoskeletal: Negative.   Neurological: Negative.   Hematological: Negative.   Psychiatric/Behavioral: Negative.     Past Medical History:  Diagnosis Date  . Hypertension   . Kidney stones     Past Surgical History:  Procedure Laterality Date  . BOWEL RESECTION  07/09/2013   Procedure: SMALL BOWEL RESECTION;  Surgeon: Earnstine Regal, MD;  Location: [facility];  Service: General;;  . LAPAROTOMY N/A 07/09/2013   Procedure: EXPLORATORY LAPAROTOMY;  Surgeon: Earnstine Regal, MD;  Location: [facility];  Service: General;  Laterality: N/A;       Current Outpatient Medications:  .  estrogen, conjugated,-medroxyprogesterone (PREMPRO) 0.45-1.5 MG tablet, Prempro 0.45 mg-1.5 mg tablet  TK 1 TABLET PO EVERY 3RD DAY, Disp: , Rfl:  .  hydrochlorothiazide (MICROZIDE) 12.5 MG capsule, TAKE 1 CAPSULE(12.5 MG) BY MOUTH DAILY, Disp: 90 capsule, Rfl: 1 .  losartan (COZAAR) 50 MG tablet, TAKE 1 TABLET(50 MG) BY MOUTH DAILY, Disp: 90 tablet, Rfl: 1 .  naproxen sodium (ALEVE) 220 MG tablet, Aleve 220 mg tablet   1 tablet every day by oral route., Disp: , Rfl:  .  Omega-3 Fatty Acids (FISH OIL) 500 MG CAPS, Take 1 capsule by mouth every other day., Disp: , Rfl:  .  triamcinolone cream (KENALOG) 0.5 %, Mix 1:1 with lotion and apply twice daily for 1-2 weeks., Disp: 30 g, Rfl: 0   Objective:   Vitals:   02/07/19 1121  BP: (!) 167/74  Pulse: 70  Temp: (!) 97.3 F (36.3 C)  SpO2: 97%    Physical Exam Vitals signs and nursing note reviewed.  Constitutional:      Appearance: Normal appearance.  HENT:     Head: Normocephalic and atraumatic.  Cardiovascular:     Rate and Rhythm: Normal rate.     Pulses: Normal pulses.     Heart sounds: Normal heart sounds.  Pulmonary:     Effort: Pulmonary effort is normal. No respiratory distress.  Abdominal:  General: There is distension.     Palpations: There is no mass.     Tenderness: There is no abdominal tenderness. There is no guarding or rebound.     Hernia: A hernia is present.  Neurological:     General: No focal deficit present.     Mental Status: She is alert and oriented to person, place, and time.  Psychiatric:        Mood and Affect: Mood normal.        Behavior: Behavior normal.        Thought Content: Thought content normal.     Assessment & Plan:  Rectus diastasis - Plan: Ambulatory referral to Physical Therapy  Recommend physical therapy for building core strength and rectus muscles.  She could wear a light spanks to help with reminding her to work on her posture and core  strength.  Both agree surgery not a good option right now.  If it changes or gets worse certainly let us know.  I would like to do a telemetry visit with her after PT and see how she is doing.  Patient agrees with plan.  [place], DO

## 2019-02-17 ENCOUNTER — Other Ambulatory Visit: Payer: Self-pay

## 2019-02-17 ENCOUNTER — Ambulatory Visit (INDEPENDENT_AMBULATORY_CARE_PROVIDER_SITE_OTHER): Payer: Medicare Other

## 2019-02-17 DIAGNOSIS — Z23 Encounter for immunization: Secondary | ICD-10-CM | POA: Diagnosis not present

## 2019-02-22 ENCOUNTER — Encounter: Payer: Self-pay | Admitting: Physical Therapy

## 2019-02-22 ENCOUNTER — Other Ambulatory Visit: Payer: Self-pay

## 2019-02-22 ENCOUNTER — Ambulatory Visit: Payer: Medicare Other | Attending: Plastic Surgery | Admitting: Physical Therapy

## 2019-02-22 DIAGNOSIS — M6281 Muscle weakness (generalized): Secondary | ICD-10-CM | POA: Insufficient documentation

## 2019-02-22 DIAGNOSIS — R293 Abnormal posture: Secondary | ICD-10-CM

## 2019-02-22 NOTE — Therapy (Signed)
[place], [place], [address] Phone: [phone]   Fax:  [phone]  Physical Therapy Evaluation  Patient Details  Name: Ellen [name] MRN: [id number] Date of Birth: 06-21-1940 Referring Provider (PT): Dr Audelia Hives    Encounter Date: 02/22/2019  PT End of Session - 02/22/19 1040    Visit Number  1    Number of Visits  6    Date for PT Re-Evaluation  04/05/19    Authorization Type  medicare 10 visit progress 15 kx    PT Start Time  0930    PT Stop Time  1014    PT Time Calculation (min)  44 min    Activity Tolerance  Patient tolerated treatment well    Behavior During Therapy  [facility] for tasks assessed/performed       Past Medical History:  Diagnosis Date  . Hypertension   . Kidney stones     Past Surgical History:  Procedure Laterality Date  . BOWEL RESECTION  07/09/2013   Procedure: SMALL BOWEL RESECTION;  Surgeon: Earnstine Regal, MD;  Location: [facility];  Service: General;;  . LAPAROTOMY N/A 07/09/2013   Procedure: EXPLORATORY LAPAROTOMY;  Surgeon: Earnstine Regal, MD;  Location: [facility];  Service: General;  Laterality: N/A;    There were no vitals filed for this visit.   Subjective Assessment - 02/22/19 0937    Subjective  The patient had a hernia repair in 2015. Her stomach muscles were cut. She has a large amount of scar tissue in her abdomen. She feels as time goes on she is having more trouble standing up straight.    Currently in Pain?  No/denies   uncomfortabel at times        [facility] PT Assessment - 02/22/19 0001      Assessment   Medical Diagnosis  Diastisis Recti    Referring Provider (PT)  Dr Audelia Hives     Next MD Visit  Nothing scheduled    Prior Therapy  for her reverse total shoulder       Precautions   Precautions  None      Restrictions   Weight Bearing Restrictions  No      Balance Screen   Has the patient fallen in the past 6 months  No     Has the patient had a decrease in activity level because of a fear of falling?   No    Is the patient reluctant to leave their home because of a fear of falling?   No      Prior Function   Leisure  likes to [employer]   Overall Cognitive Status  Within Functional Limits for tasks assessed    Attention  Focused    Focused Attention  Appears intact    Memory  Appears intact    Awareness  Appears intact    Problem Solving  Appears intact    Executive Function  Reasoning      Observation/Other Assessments   Focus on Therapeutic Outcomes (FOTO)   N/A       Sensation   Light Touch  Appears Intact    Additional Comments  Denies parathesias       Coordination   Gross Motor Movements are Fluid and Coordinated  Yes    Fine Motor Movements are Fluid and Coordinated  Yes      Posture/Postural Control   Posture Comments  rounded  shoulders and forward head       ROM / Strength   AROM / PROM / Strength  AROM;PROM;Strength      AROM   Overall AROM Comments  full lumbar active ROM without pain       PROM   Overall PROM Comments  mild limitation in hip extension       Strength   Overall Strength Comments  5/5 gross LE strength; limited core contraction       Palpation   Palpation comment  no TTP in lumbar spine                 Objective measurements completed on examination: See above findings.      [facility] PT Treatment/Exercise - 02/22/19 0001      Lumbar Exercises: Supine   AB Set Limitations  reviewed abdominal breathing     Clam Limitations  2x10 with cuing for breathing.     Bent Knee Raise Limitations  x10 with cuing     Other Supine Lumbar Exercises  supine ball squeeze with breathing             PT Education - 02/22/19 1038    Education Details  HEP, symptom mangement; improtance of posture    Person(s) Educated  Patient    Methods  Tactile cues;Explanation;Demonstration;Verbal cues    Comprehension  Verbalized  understanding;Verbal cues required;Tactile cues required;Returned demonstration       PT Short Term Goals - 02/22/19 1054      PT SHORT TERM GOAL #1   Title  STG =LTG        PT Long Term Goals - 02/22/19 1054      PT LONG TERM GOAL #1   Title  Patient will be independent with exercisie program for core strengthening    Time  6    Period  Weeks    Status  New    Target Date  04/05/19      PT LONG TERM GOAL #2   Title  Patient will demonstrate good core contraction and abdominal breathing without cuing in order to protect her back with gardening    Time  6    Period  Weeks    Status  New    Target Date  04/05/19      PT LONG TERM GOAL #3   Title  Patient will stand with good posture without cuing    Time  6    Period  Weeks    Status  New    Target Date  04/05/19             Plan - 02/22/19 1044    Clinical Impression Statement  Patient is a 78 year old female with decreased ability to fire her abdominal muscles following a major surgery in 2015. She has a flexed posture which she feels ahas been getting worse over time. She is an avid gardener. She would benefit from a home program to improve posture and improve ability to breace her core so she can continue to be active.    Personal Factors and Comorbidities  Age    Examination-Activity Limitations  Lift    Examination-Participation Restrictions  Yard Work    Stability/Clinical Decision Making  Stable/Uncomplicated    Clinical Decision Making  Low    Rehab Potential  Excellent    PT Frequency  1x / week    PT Duration  6 weeks    PT Treatment/Interventions  ADLs/Self Care Home  Management;Cryotherapy;Electrical Stimulation;Therapeutic exercise;Therapeutic activities;Functional mobility training;Balance training;Neuromuscular re-education;Patient/family education    PT Next Visit Plan  consider bridging; SLR; thomas stretch; hamstring stretch, progress to postural exercises. Is only going to do 2-3 visits to build  a basic core strengthening and stretching program.    PT Home Exercise Plan  supine march, abdominal breathing, ball squeeze with ab, hip abduction with ab    Consulted and Agree with Plan of Care  Patient       Patient will benefit from skilled therapeutic intervention in order to improve the following deficits and impairments:  Decreased activity tolerance, Postural dysfunction, Pain  Visit Diagnosis: Muscle weakness (generalized)  Abnormal posture     Problem List Patient Active Problem List   Diagnosis Date Noted  . Diverticulosis 01/05/2019  . Dermatitis 01/05/2019  . Systolic murmur 123456  . 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 Hyperglycemia 10/05/2017  . Leg edema 10/05/2017  . 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 Hot flashes due to menopause 10/05/2017  . 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 Hypertension 07/09/2013    Carney Living PT DPT  02/22/2019, 11:43 AM  [facility] [address] [place], [place], [address] Phone: [phone]   Fax:  [phone]  Name: Ellen [name] MRN: [id number] Date of Birth: 18-Jan-1941

## 2019-03-01 ENCOUNTER — Other Ambulatory Visit: Payer: Self-pay

## 2019-03-01 ENCOUNTER — Ambulatory Visit: Payer: Medicare Other | Admitting: Physical Therapy

## 2019-03-01 ENCOUNTER — Encounter: Payer: Self-pay | Admitting: Physical Therapy

## 2019-03-01 DIAGNOSIS — R293 Abnormal posture: Secondary | ICD-10-CM | POA: Diagnosis not present

## 2019-03-01 DIAGNOSIS — M6281 Muscle weakness (generalized): Secondary | ICD-10-CM | POA: Diagnosis not present

## 2019-03-01 NOTE — Therapy (Signed)
[place], [place], [address] Phone: [phone]   Fax:  [phone]  Physical Therapy Treatment  Patient Details  Name: Ellen [name] MRN: [id number] Date of Birth: [birth year] Referring Provider (PT): Dr Audelia Hives    Encounter Date: 03/01/2019  PT End of Session - 03/01/19 0926    Visit Number  2    Number of Visits  6    Date for PT Re-Evaluation  04/05/19    Authorization Type  medicare 10 visit progress 15 kx    PT Start Time  0834    PT Stop Time  0915    PT Time Calculation (min)  41 min    Activity Tolerance  Patient tolerated treatment well    Behavior During Therapy  [facility] for tasks assessed/performed       Past Medical History:  Diagnosis Date  . Hypertension   . Kidney stones     Past Surgical History:  Procedure Laterality Date  . BOWEL RESECTION  07/09/2013   Procedure: SMALL BOWEL RESECTION;  Surgeon: Earnstine Regal, MD;  Location: [facility];  Service: General;;  . LAPAROTOMY N/A 07/09/2013   Procedure: EXPLORATORY LAPAROTOMY;  Surgeon: Earnstine Regal, MD;  Location: [facility];  Service: General;  Laterality: N/A;    There were no vitals filed for this visit.  Subjective Assessment - 03/01/19 0924    Subjective  Pt states difficulty in last few months with standing fully upright, feels she is leaning forward more with poor posture.    Currently in Pain?  No/denies                       [facility] Adult PT Treatment/Exercise - 03/01/19 0842      Posture/Postural Control   Posture Comments  rounded shoulders and forward head       Exercises   Exercises  Lumbar      Lumbar Exercises: Aerobic   Nustep  L5 x 6 min      Lumbar Exercises: Standing   Row  10 reps    Theraband Level (Row)  Level 2 (Red)    Row Limitations  Mild pain in R shoulder     Shoulder Extension  10 reps    Shoulder Extension Limitations  Low Row       Lumbar Exercises: Supine    Ab Set  20 reps    AB Set Limitations  practice for active TA contraction with breathing     Pelvic Tilt  10 reps    Clam  20 reps    Clam Limitations  RTB with TA    Bent Knee Raise  20 reps    Bent Knee Raise Limitations  with TA    Basic Lumbar Stabilization  10 reps    Basic Lumbar Stabilization Limitations  TA with UE flexion    Straight Leg Raise  10 reps    Straight Leg Raises Limitations  Bil     Other Supine Lumbar Exercises  --               PT Short Term Goals - 02/22/19 1054      PT SHORT TERM GOAL #1   Title  STG =LTG        PT Long Term Goals - 02/22/19 1054      PT LONG TERM GOAL #1   Title  Patient will be independent with exercisie program for core strengthening  Time  6    Period  Weeks    Status  New    Target Date  04/05/19      PT LONG TERM GOAL #2   Title  Patient will demonstrate good core contraction and abdominal breathing without cuing in order to protect her back with gardening    Time  6    Period  Weeks    Status  New    Target Date  04/05/19      PT LONG TERM GOAL #3   Title  Patient will stand with good posture without cuing    Time  6    Period  Weeks    Status  New    Target Date  04/05/19            Plan - 03/01/19 0930    Clinical Impression Statement  Pt with good ability for progressed ther ex today. She has minimal scar tissue, but does have significant diastisis. Education and practice for active TA contraction. Pt with much diffiuclty with this, but improved after practice today. Added standing scapular strengthening for posture. Plan to progress as tolerated.    Personal Factors and Comorbidities  Age    Examination-Activity Limitations  Lift    Examination-Participation Restrictions  Yard Work    Stability/Clinical Decision Making  Stable/Uncomplicated    Rehab Potential  Excellent    PT Frequency  1x / week    PT Duration  6 weeks    PT Treatment/Interventions  ADLs/Self Care Home  Management;Cryotherapy;Electrical Stimulation;Therapeutic exercise;Therapeutic activities;Functional mobility training;Balance training;Neuromuscular re-education;Patient/family education    PT Next Visit Plan  consider bridging; SLR; thomas stretch; hamstring stretch, progress to postural exercises. Is only going to do 2-3 visits to build a basic core strengthening and stretching program.    PT Home Exercise Plan  supine march, abdominal breathing, ball squeeze with ab, hip abduction with ab    Consulted and Agree with Plan of Care  Patient       Patient will benefit from skilled therapeutic intervention in order to improve the following deficits and impairments:  Decreased activity tolerance, Postural dysfunction, Pain  Visit Diagnosis: Muscle weakness (generalized)  Abnormal posture     Problem List Patient Active Problem List   Diagnosis Date Noted  . Diverticulosis 01/05/2019  . Dermatitis 01/05/2019  . Systolic murmur 123456  . 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 Hyperglycemia 10/05/2017  . Leg edema 10/05/2017  . 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 Hot flashes due to menopause 10/05/2017  . 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 Hypertension 07/09/2013    Lyndee Hensen, PT, DPT 9:38 AM  03/01/19    [facility] [address] [place], [place], [address] Phone: [phone]   Fax:  [phone]  Name: Ellen [name] MRN: [id number] Date of Birth: 06/26/1940

## 2019-03-07 ENCOUNTER — Ambulatory Visit: Payer: Medicare Other | Admitting: Physical Therapy

## 2019-03-07 ENCOUNTER — Other Ambulatory Visit: Payer: Self-pay

## 2019-03-07 ENCOUNTER — Encounter: Payer: Self-pay | Admitting: Physical Therapy

## 2019-03-07 DIAGNOSIS — M6281 Muscle weakness (generalized): Secondary | ICD-10-CM

## 2019-03-07 DIAGNOSIS — R293 Abnormal posture: Secondary | ICD-10-CM

## 2019-03-07 NOTE — Therapy (Signed)
[place], [place], [address] Phone: [phone]   Fax:  [phone]  Physical Therapy Treatment  Patient Details  Name: Ellen [name] MRN: [id number] Date of Birth: 02/20/1941 Referring Provider (PT): Dr Audelia Hives    Encounter Date: 03/07/2019  PT End of Session - 03/07/19 0936    Visit Number  3    Number of Visits  6    Date for PT Re-Evaluation  04/05/19    Authorization Type  medicare 10 visit progress 15 kx    PT Start Time  0930    PT Stop Time  1012    PT Time Calculation (min)  42 min    Activity Tolerance  Patient tolerated treatment well    Behavior During Therapy  [facility] for tasks assessed/performed       Past Medical History:  Diagnosis Date  . Hypertension   . Kidney stones     Past Surgical History:  Procedure Laterality Date  . BOWEL RESECTION  07/09/2013   Procedure: SMALL BOWEL RESECTION;  Surgeon: Earnstine Regal, MD;  Location: [facility];  Service: General;;  . LAPAROTOMY N/A 07/09/2013   Procedure: EXPLORATORY LAPAROTOMY;  Surgeon: Earnstine Regal, MD;  Location: [facility];  Service: General;  Laterality: N/A;    There were no vitals filed for this visit.  Subjective Assessment - 03/07/19 0935    Subjective  Patient has no complaints. She wasssore in her abdomen but it didn't latst long.    Currently in Pain?  No/denies                       [facility] Adult PT Treatment/Exercise - 03/07/19 0001      Self-Care   Self-Care  Heat/Ice Application;Other Self-Care Comments    Other Self-Care Comments   reivewed how to adbvance brridging and marching forward. Reviewed how rotational stability ties into her funtion.       Lumbar Exercises: Standing   Row  10 reps    Theraband Level (Row)  Level 2 (Red)    Shoulder Extension  10 reps    Theraband Level (Shoulder Extension)  Level 1 (Yellow)    Other Standing Lumbar Exercises  pallof press 1 tail yellow  x10 each way; high chop yellow 1 tail x10 each way       Lumbar Exercises: Supine   Pelvic Tilt  10 reps    Clam  20 reps    Clam Limitations  green with TA    Bent Knee Raise  10 reps    Bent Knee Raise Limitations  with TA    Straight Leg Raise  10 reps    Straight Leg Raises Limitations  Bil              PT Education - 03/07/19 0936    Education Details  reviewed HEP and symptom manegement    Person(s) Educated  Patient    Methods  Explanation;Demonstration;Tactile cues;Verbal cues    Comprehension  Returned demonstration;Verbalized understanding;Verbal cues required;Tactile cues required       PT Short Term Goals - 02/22/19 1054      PT SHORT TERM GOAL #1   Title  STG =LTG        PT Long Term Goals - 02/22/19 1054      PT LONG TERM GOAL #1   Title  Patient will be independent with exercisie program for core strengthening    Time  6    Period  Weeks    Status  New    Target Date  04/05/19      PT LONG TERM GOAL #2   Title  Patient will demonstrate good core contraction and abdominal breathing without cuing in order to protect her back with gardening    Time  6    Period  Weeks    Status  New    Target Date  04/05/19      PT LONG TERM GOAL #3   Title  Patient will stand with good posture without cuing    Time  6    Period  Weeks    Status  New    Target Date  04/05/19            Plan - 03/07/19 1021    Clinical Impression Statement  Therapy added rotational strengthening today to help with her ability to perfrom yard work. She has been oworking on her strengthening at home. She had no increase in pain with her exercises. Patient advised to pick 3-4 exercises for home. Therapy alos educated her on activity progression in the future.    Personal Factors and Comorbidities  Age    Examination-Activity Limitations  Lift    Examination-Participation Restrictions  Yard Work    Stability/Clinical Decision Making  Stable/Uncomplicated    Clinical  Decision Making  Low    Rehab Potential  Excellent    PT Frequency  1x / week    PT Duration  6 weeks    PT Treatment/Interventions  ADLs/Self Care Home Management;Cryotherapy;Electrical Stimulation;Therapeutic exercise;Therapeutic activities;Functional mobility training;Balance training;Neuromuscular re-education;Patient/family education    PT Next Visit Plan  consider bridging; SLR; thomas stretch; hamstring stretch, progress to postural exercises. Is only going to do 2-3 visits to build a basic core strengthening and stretching program.    PT Home Exercise Plan  supine march, abdominal breathing, ball squeeze with ab, hip abduction with ab    Consulted and Agree with Plan of Care  Patient       Patient will benefit from skilled therapeutic intervention in order to improve the following deficits and impairments:  Decreased activity tolerance, Postural dysfunction, Pain  Visit Diagnosis: Muscle weakness (generalized)  Abnormal posture     Problem List Patient Active Problem List   Diagnosis Date Noted  . Diverticulosis 01/05/2019  . Dermatitis 01/05/2019  . Systolic murmur 123456  . 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 Hyperglycemia 10/05/2017  . Leg edema 10/05/2017  . 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 Hot flashes due to menopause 10/05/2017  . 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 Hypertension 07/09/2013    Carney Living PT DPT  03/07/2019, 1:28 PM  [facility] [address] [place], [place], [address] Phone: [phone]   Fax:  [phone]  Name: Nyel Hursh MRN: [id number] Date of Birth: 02-Aug-1940

## 2019-03-14 ENCOUNTER — Ambulatory Visit: Payer: Medicare Other | Admitting: Physical Therapy

## 2019-03-14 ENCOUNTER — Encounter: Payer: Self-pay | Admitting: Physical Therapy

## 2019-03-14 ENCOUNTER — Other Ambulatory Visit: Payer: Self-pay

## 2019-03-14 DIAGNOSIS — R293 Abnormal posture: Secondary | ICD-10-CM

## 2019-03-14 DIAGNOSIS — M6281 Muscle weakness (generalized): Secondary | ICD-10-CM | POA: Diagnosis not present

## 2019-03-14 NOTE — Therapy (Signed)
[place], [place], [address] Phone: [phone]   Fax:  [phone]  Physical Therapy Treatment/Discharge   Patient Details  Name: Ellen [name] MRN: [id number] Date of Birth: 03/26/1941 Referring Provider (PT): Dr Audelia Hives    Encounter Date: 03/14/2019  PT End of Session - 03/14/19 0943    Visit Number  4    Number of Visits  6    Date for PT Re-Evaluation  04/05/19    Authorization Type  medicare 10 visit progress 15 kx    PT Start Time  0930    PT Stop Time  1011    PT Time Calculation (min)  41 min    Activity Tolerance  Patient tolerated treatment well    Behavior During Therapy  [facility] for tasks assessed/performed       Past Medical History:  Diagnosis Date  . Hypertension   . Kidney stones     Past Surgical History:  Procedure Laterality Date  . BOWEL RESECTION  07/09/2013   Procedure: SMALL BOWEL RESECTION;  Surgeon: Earnstine Regal, MD;  Location: [facility];  Service: General;;  . LAPAROTOMY N/A 07/09/2013   Procedure: EXPLORATORY LAPAROTOMY;  Surgeon: Earnstine Regal, MD;  Location: [facility];  Service: General;  Laterality: N/A;    There were no vitals filed for this visit.  Subjective Assessment - 03/14/19 0937    Subjective  Patient feels stiff today. She painted her baseboards yesterday. She had no soreness after the last visit.    Currently in Pain?  No/denies                       [facility] Adult PT Treatment/Exercise - 03/14/19 0001      Self-Care   Other Self-Care Comments   reviewed how to advance mat execises. Reviewed a good program to perfrom at home doing 3 different sets (one each day Monday, Wedenesday, Friday) and maybe getting one more session oin a week.       Lumbar Exercises: Stretches   Passive Hamstring Stretch Limitations  3x20 sec hold     Piriformis Stretch Limitations  3x20 sec hold     Other Lumbar Stretch Exercise  thomas  stretch 3x20 sec hold      Lumbar Exercises: Supine   Pelvic Tilt  10 reps    Clam  20 reps    Clam Limitations  green with TA; reviewed how to ocombine with bridge     Bent Knee Raise  10 reps    Bent Knee Raise Limitations  with TA reviewed progrssion to more difficult exercise     Bridge Limitations  reviewed bridge with progression to other exercises.     Straight Leg Raise  10 reps    Straight Leg Raises Limitations  Bil              PT Education - 03/14/19 0940    Education Details  reviewed final HEP    Person(s) Educated  Patient    Methods  Explanation;Demonstration;Tactile cues;Verbal cues    Comprehension  Returned demonstration;Verbal cues required;Tactile cues required;Verbalized understanding       PT Short Term Goals - 02/22/19 1054      PT SHORT TERM GOAL #1   Title  STG =LTG        PT Long Term Goals - 03/14/19 1352      PT LONG TERM GOAL #1   Title  Patient  will be independent with exercisie program for core strengthening    Baseline  perfroming at home    Time  6    Period  Weeks    Status  On-going      PT LONG TERM GOAL #2   Title  Patient will demonstrate good core contraction and abdominal breathing without cuing in order to protect her back with gardening    Time  6    Period  Weeks    Status  On-going      PT LONG TERM GOAL #3   Title  Patient will stand with good posture without cuing    Time  6    Period  Weeks            Plan - 03/14/19 9381    Clinical Impression Statement  Patient has made great progress. She has been working on her exercises at home. Therapy added a standing series in today. She had no increase in pain. Therapy also reviewed how to make her exercises harder. She is comfortable with her HEP. DC to HEP. See below for sprot specific exercises.    Personal Factors and Comorbidities  Age    Examination-Activity Limitations  Lift    Examination-Participation Restrictions  Yard Work    Stability/Clinical  Decision Making  Stable/Uncomplicated    Clinical Decision Making  Low    Rehab Potential  Excellent    PT Frequency  1x / week    PT Duration  6 weeks    PT Treatment/Interventions  ADLs/Self Care Home Management;Cryotherapy;Electrical Stimulation;Therapeutic exercise;Therapeutic activities;Functional mobility training;Balance training;Neuromuscular re-education;Patient/family education    PT Next Visit Plan  consider bridging; SLR; thomas stretch; hamstring stretch, progress to postural exercises. Is only going to do 2-3 visits to build a basic core strengthening and stretching program.    PT Home Exercise Plan  supine march, abdominal breathing, ball squeeze with ab, hip abduction with ab    Consulted and Agree with Plan of Care  Patient       Patient will benefit from skilled therapeutic intervention in order to improve the following deficits and impairments:  Decreased activity tolerance, Postural dysfunction, Pain  Visit Diagnosis: Muscle weakness (generalized)  Abnormal posture  PHYSICAL THERAPY DISCHARGE SUMMARY  Visits from Start of Care: 4  Current functional level related to goals / functional outcomes: Has a good home exercise program    Remaining deficits: None    Education / Equipment: HEP   Plan: Patient agrees to discharge.  Patient goals were met. Patient is being discharged due to meeting the stated rehab goals.  ?????       Problem List Patient Active Problem List   Diagnosis Date Noted  . Diverticulosis 01/05/2019  . Dermatitis 01/05/2019  . Systolic murmur 82/99/3716  . 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 Hyperglycemia 10/05/2017  . Leg edema 10/05/2017  . 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 Hot flashes due to menopause 10/05/2017  . 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 Hypertension 07/09/2013    Carney Living  PT DPT  03/14/2019, 1:56  PM  [facility] [address] [place], [place], [address] Phone: [phone]   Fax:  [phone]  Name: Malaysha Arlen MRN: [id number] Date of Birth: March 15, 1941

## 2019-05-09 ENCOUNTER — Telehealth: Payer: Self-pay | Admitting: Family Medicine

## 2019-05-09 NOTE — Telephone Encounter (Signed)
I left a message asking the patient to call and schedule Medicare AWV with Courtney ([facility]).  If patient calls back, please schedule Medicare Wellness Visit at next available opening. Last AWV May 2018  VDM (Dee-Dee) 

## 2019-05-15 ENCOUNTER — Ambulatory Visit (INDEPENDENT_AMBULATORY_CARE_PROVIDER_SITE_OTHER): Payer: Medicare Other

## 2019-05-15 ENCOUNTER — Other Ambulatory Visit: Payer: Self-pay

## 2019-05-15 DIAGNOSIS — Z0000 Encounter for general adult medical examination without abnormal findings: Secondary | ICD-10-CM | POA: Diagnosis not present

## 2019-05-15 DIAGNOSIS — Z780 Asymptomatic menopausal state: Secondary | ICD-10-CM | POA: Diagnosis not present

## 2019-05-15 NOTE — Progress Notes (Signed)
This visit is being conducted via phone call due to the COVID-19 pandemic. This patient has given me verbal consent via phone to conduct this visit, patient states they are participating from their home address. Some vital signs may be absent or patient reported.   Patient identification: identified by name, DOB, and current address.  Location provider: [facility], Office Persons participating in the virtual visit: Denman George LPN, patient, spouse(Wayne Tellado), and Dr. Dimas Chyle   Subjective:   Ellen [name] is a 78 y.o. female who presents for Medicare Annual (Subsequent) preventive examination.  Review of Systems:   Cardiac Risk Factors include: advanced age (>67men, >22 women);hypertension    Objective:     Vitals: There were no vitals taken for this visit.  There is no height or weight on file to calculate BMI.  Advanced Directives 05/15/2019 02/22/2019 01/10/2015 03/10/2014 07/09/2013  Does Patient Have a Medical Advance Directive? Yes Yes No No Patient does not have advance directive;Patient would not like information  Type of Advance Directive Living will;Healthcare Power of [place];Living will - - -  Does patient want to make changes to medical advance directive? No - Patient declined - - - -  Copy of [place] in Chart? No - copy requested Yes - validated most recent copy scanned in chart (See row information) - - -  Would patient like information on creating a medical advance directive? - - No - patient declined information Yes - Educational materials given -  Pre-existing out of facility DNR order (yellow form or pink MOST form) - - - - No    Tobacco Social History   Tobacco Use  Smoking Status Former Smoker  Smokeless Tobacco Never Used     Counseling given: Not Answered   Clinical Intake:  Pre-visit preparation completed: Yes  Pain : 0-10 Pain Score: 3  Pain Location: Leg Pain  Orientation: Left, Right Pain Descriptors / Indicators: Aching Pain Onset: More than a month ago Pain Frequency: Occasional(1st thing in the morning)  Diabetes: No  How often do you need to have someone help you when you read instructions, pamphlets, or other written materials from your doctor or pharmacy?: 1 - Never  Interpreter Needed?: No  Information entered by :: Denman George LPN  Past Medical History:  Diagnosis Date  . Hypertension   . Kidney stones    Past Surgical History:  Procedure Laterality Date  . BOWEL RESECTION  07/09/2013   Procedure: SMALL BOWEL RESECTION;  Surgeon: Earnstine Regal, MD;  Location: [facility];  Service: General;;  . LAPAROTOMY N/A 07/09/2013   Procedure: EXPLORATORY LAPAROTOMY;  Surgeon: Earnstine Regal, MD;  Location: [facility];  Service: General;  Laterality: N/A;   Family History  Problem Relation Age of Onset  . Diabetes Mother   . Heart attack Mother   . CAD Father   . Heart attack Father   . Breast cancer Neg Hx    Social History   Socioeconomic History  . Marital status: Married    Spouse name: Not on file  . Number of children: Not on file  . Years of education: Not on file  . Highest education level: Not on file  Occupational History  . Occupation: Retired   Tobacco Use  . Smoking status: Former Research scientist (life sciences)  . Smokeless tobacco: Never Used  Substance and Sexual Activity  . Alcohol use: Yes    Comment: wine  . Drug use: No  .  Sexual activity: Not on file  Other Topics Concern  . Not on file  Social History Narrative   Enjoys gardening    Social Determinants of Health   Financial Resource Strain:   . Difficulty of Paying Living Expenses: Not on file  Food Insecurity:   . Worried About Charity fundraiser in the Last Year: Not on file  . Ran Out of Food in the Last Year: Not on file  Transportation Needs:   . Lack of Transportation (Medical): Not on file  . Lack of Transportation (Non-Medical): Not on file  Physical Activity:     . Days of Exercise per Week: Not on file  . Minutes of Exercise per Session: Not on file  Stress:   . Feeling of Stress : Not on file  Social Connections:   . Frequency of Communication with Friends and Family: Not on file  . Frequency of Social Gatherings with Friends and Family: Not on file  . Attends Religious Services: Not on file  . Active Member of Clubs or Organizations: Not on file  . Attends Archivist Meetings: Not on file  . Marital Status: Not on file    Outpatient Encounter Medications as of 05/15/2019  Medication Sig  . estrogen, conjugated,-medroxyprogesterone (PREMPRO) 0.45-1.5 MG tablet Prempro 0.45 mg-1.5 mg tablet  TK 1 TABLET PO EVERY 3RD DAY  . hydrochlorothiazide (MICROZIDE) 12.5 MG capsule TAKE 1 CAPSULE(12.5 MG) BY MOUTH DAILY  . losartan (COZAAR) 50 MG tablet TAKE 1 TABLET(50 MG) BY MOUTH DAILY  . naproxen sodium (ALEVE) 220 MG tablet Aleve 220 mg tablet   1 tablet every day by oral route.  . Omega-3 Fatty Acids (FISH OIL) 500 MG CAPS Take 1 capsule by mouth every other day.  . triamcinolone cream (KENALOG) 0.5 % Mix 1:1 with lotion and apply twice daily for 1-2 weeks.   No facility-administered encounter medications on file as of 05/15/2019.    Activities of Daily Living In your present state of health, do you have any difficulty performing the following activities: 05/15/2019  Hearing? N  Vision? N  Difficulty concentrating or making decisions? N  Walking or climbing stairs? N  Dressing or bathing? N  Doing errands, shopping? N  Preparing Food and eating ? N  Using the Toilet? N  In the past six months, have you accidently leaked urine? N  Do you have problems with loss of bowel control? N  Managing your Medications? N  Managing your Finances? N  Housekeeping or managing your Housekeeping? N  Some recent data might be hidden    Patient Care Team: Vivi Barrack, MD as PCP - General (Family Medicine)    Assessment:   This is a  routine wellness examination for [name].  Exercise Activities and Dietary recommendations Current Exercise Habits: Home exercise routine, Type of exercise: walking;Other - see comments(yardword/ gardening), Time (Minutes): 30, Frequency (Times/Week): 5, Weekly Exercise (Minutes/Week): 150, Intensity: Mild  Goals   None     Fall Risk Fall Risk  05/15/2019 10/05/2017  Falls in the past year? 0 No  Injury with Fall? 0 -  Follow up Falls evaluation completed;Education provided;Falls prevention discussed -   Is the patient's home free of loose throw rugs in walkways, pet beds, electrical cords, etc?   yes      Grab bars in the bathroom? yes      Handrails on the stairs?   yes      Adequate lighting?   yes  Depression Screen PHQ 2/9 Scores 05/15/2019 07/25/2018 10/05/2017  PHQ - 2 Score 0 0 0     Cognitive Function- no cognitive concerns at this time  Cognitive Testing  Alert? Yes         Normal Appearance? N/a  Oriented to person? Yes           Place? Yes  Time? Yes  Recall of three objects? Yes  Can perform simple calculations? Yes  Displays appropriate judgment? Yes  Can read the correct time from a watch face? Yes   Immunization History  Administered Date(s) Administered  . Fluad Quad(high Dose 65+) 02/17/2019  . Influenza, High Dose Seasonal PF 07/25/2018    Qualifies for Shingles Vaccine?Discussed and patient will check with pharmacy for coverage.  Patient education handout provided   Screening Tests Health Maintenance  Topic Date Due  . DEXA SCAN  03/11/2004  . MAMMOGRAM  05/11/2019  . TETANUS/TDAP  10/27/2019 (Originally 03/11/1960)  . PNA vac Low Risk Adult (1 of 2 - PCV13) 10/27/2019 (Originally 03/11/2004)  . INFLUENZA VACCINE  Completed    Cancer Screenings: Lung: Low Dose CT Chest recommended if Age 14-80 years, 30 pack-year currently smoking OR have quit w/in 15years. Patient does not qualify. Breast:  Up to date on Mammogram? Yes; patient plans to  schedule for annual imaging    Up to date of Bone Density/Dexa? No; ordered today Colorectal: No longer indicated    Plan:  I have personally reviewed and addressed the Medicare Annual Wellness questionnaire and have noted the following in the patient's chart:  A. Medical and social history B. Use of alcohol, tobacco or illicit drugs  C. Current medications and supplements D. Functional ability and status E.  Nutritional status F.  Physical activity G. Advance directives H. List of other physicians I.  Hospitalizations, surgeries, and ER visits in previous 12 months J.  [place] such as hearing and vision if needed, cognitive and depression L. Referrals, records requested, and appointments- dexa ordered   In addition, I have reviewed and discussed with patient certain preventive protocols, quality metrics, and best practice recommendations. A written personalized care plan for preventive services as well as general preventive health recommendations were provided to patient.   Signed,  Denman George, LPN  Nurse Health Advisor   Nurse Notes: Patient with complaints of bilateral leg pain that awakens her in the morning.  Scheduled for appointment on 05/16/19

## 2019-05-15 NOTE — Patient Instructions (Signed)
Ellen [name] , Thank you for taking time to come for your Medicare Wellness Visit. I appreciate your ongoing commitment to your health goals. Please review the following plan we discussed and let me know if I can assist you in the future.   Screening recommendations/referrals: Colorectal Screening: No longer indicated Mammogram: up to date; last 05/10/18 Bone Density: ordered today; last 11/06/14  Vision and Dental Exams: Recommended annual ophthalmology exams for early detection of glaucoma and other disorders of the eye Recommended annual dental exams for proper oral hygiene  Vaccinations: Influenza vaccine: completed 02/17/19 Pneumococcal vaccine: up to date; last 11/04/12 Tdap vaccine: up to date; last 11/04/12  Shingles vaccine: Please call your insurance company to determine your out of pocket expense for the Shingrix vaccine. You may receive this vaccine at your local pharmacy.  Advanced directives: Please bring a copy of your POA (Power of Attorney) and/or Living Will to your next appointment.  Goals: Recommend to drink at least 6-8 8oz glasses of water per day and consume a balanced diet rich in fresh fruits and vegetables.   Next appointment: Please schedule your Annual Wellness Visit with your Nurse Health Advisor in one year.  Preventive Care 36 Years and Older, Female Preventive care refers to lifestyle choices and visits with your health care provider that can promote health and wellness. What does preventive care include?  A yearly physical exam. This is also called an annual well check.  Dental exams once or twice a year.  Routine eye exams. Ask your health care provider how often you should have your eyes checked.  Personal lifestyle choices, including:  Daily care of your teeth and gums.  Regular physical activity.  Eating a healthy diet.  Avoiding tobacco and drug use.  Limiting alcohol use.  Practicing safe sex.  Taking low-dose aspirin every day if  recommended by your health care provider.  Taking vitamin and mineral supplements as recommended by your health care provider. What happens during an annual well check? The services and screenings done by your health care provider during your annual well check will depend on your age, overall health, lifestyle risk factors, and family history of disease. Counseling  Your health care provider may ask you questions about your:  Alcohol use.  Tobacco use.  Drug use.  Emotional well-being.  Home and relationship well-being.  Sexual activity.  Eating habits.  History of falls.  Memory and ability to understand (cognition).  Work and work Statistician.  Reproductive health. Screening  You may have the following tests or measurements:  Height, weight, and BMI.  Blood pressure.  Lipid and cholesterol levels. These may be checked every 5 years, or more frequently if you are over 62 years old.  Skin check.  Lung cancer screening. You may have this screening every year starting at age 64 if you have a 30-pack-year history of smoking and currently smoke or have quit within the past 15 years.  Fecal occult blood test (FOBT) of the stool. You may have this test every year starting at age 35.  Flexible sigmoidoscopy or colonoscopy. You may have a sigmoidoscopy every 5 years or a colonoscopy every 10 years starting at age 3.  Hepatitis C blood test.  Hepatitis B blood test.  Sexually transmitted disease (STD) testing.  Diabetes screening. This is done by checking your blood sugar (glucose) after you have not eaten for a while (fasting). You may have this done every 1-3 years.  Bone density scan. This is done to  screen for osteoporosis. You may have this done starting at age 32.  Mammogram. This may be done every 1-2 years. Talk to your health care provider about how often you should have regular mammograms. Talk with your health care provider about your test results,  treatment options, and if necessary, the need for more tests. Vaccines  Your health care provider may recommend certain vaccines, such as:  Influenza vaccine. This is recommended every year.  Tetanus, diphtheria, and acellular pertussis (Tdap, Td) vaccine. You may need a Td booster every 10 years.  Zoster vaccine. You may need this after age 59.  Pneumococcal 13-valent conjugate (PCV13) vaccine. One dose is recommended after age 59.  Pneumococcal polysaccharide (PPSV23) vaccine. One dose is recommended after age 52. Talk to your health care provider about which screenings and vaccines you need and how often you need them. This information is not intended to replace advice given to you by your health care provider. Make sure you discuss any questions you have with your health care provider. Document Released: 05/31/2015 Document Revised: 01/22/2016 Document Reviewed: 03/05/2015 Elsevier Interactive Patient Education  2017 [employer] Prevention in the Home Falls can cause injuries. They can happen to people of all ages. There are many things you can do to make your home safe and to help prevent falls. What can I do on the outside of my home?  Regularly fix the edges of walkways and driveways and fix any cracks.  Remove anything that might make you trip as you walk through a door, such as a raised step or threshold.  Trim any bushes or trees on the path to your home.  Use bright outdoor lighting.  Clear any walking paths of anything that might make someone trip, such as rocks or tools.  Regularly check to see if handrails are loose or broken. Make sure that both sides of any steps have handrails.  Any raised decks and porches should have guardrails on the edges.  Have any leaves, snow, or ice cleared regularly.  Use sand or salt on walking paths during winter.  Clean up any spills in your garage right away. This includes oil or grease spills. What can I do in the  bathroom?  Use night lights.  Install grab bars by the toilet and in the tub and shower. Do not use towel bars as grab bars.  Use non-skid mats or decals in the tub or shower.  If you need to sit down in the shower, use a plastic, non-slip stool.  Keep the floor dry. Clean up any water that spills on the floor as soon as it happens.  Remove soap buildup in the tub or shower regularly.  Attach bath mats securely with double-sided non-slip rug tape.  Do not have throw rugs and other things on the floor that can make you trip. What can I do in the bedroom?  Use night lights.  Make sure that you have a light by your bed that is easy to reach.  Do not use any sheets or blankets that are too big for your bed. They should not hang down onto the floor.  Have a firm chair that has side arms. You can use this for support while you get dressed.  Do not have throw rugs and other things on the floor that can make you trip. What can I do in the kitchen?  Clean up any spills right away.  Avoid walking on wet floors.  Keep items that  you use a lot in easy-to-reach places.  If you need to reach something above you, use a strong step stool that has a grab bar.  Keep electrical cords out of the way.  Do not use floor polish or wax that makes floors slippery. If you must use wax, use non-skid floor wax.  Do not have throw rugs and other things on the floor that can make you trip. What can I do with my stairs?  Do not leave any items on the stairs.  Make sure that there are handrails on both sides of the stairs and use them. Fix handrails that are broken or loose. Make sure that handrails are as long as the stairways.  Check any carpeting to make sure that it is firmly attached to the stairs. Fix any carpet that is loose or worn.  Avoid having throw rugs at the top or bottom of the stairs. If you do have throw rugs, attach them to the floor with carpet tape.  Make sure that you have a  light switch at the top of the stairs and the bottom of the stairs. If you do not have them, ask someone to add them for you. What else can I do to help prevent falls?  Wear shoes that:  Do not have high heels.  Have rubber bottoms.  Are comfortable and fit you well.  Are closed at the toe. Do not wear sandals.  If you use a stepladder:  Make sure that it is fully opened. Do not climb a closed stepladder.  Make sure that both sides of the stepladder are locked into place.  Ask someone to hold it for you, if possible.  Clearly mark and make sure that you can see:  Any grab bars or handrails.  First and last steps.  Where the edge of each step is.  Use tools that help you move around (mobility aids) if they are needed. These include:  Canes.  Walkers.  Scooters.  Crutches.  Turn on the lights when you go into a dark area. Replace any light bulbs as soon as they burn out.  Set up your furniture so you have a clear path. Avoid moving your furniture around.  If any of your floors are uneven, fix them.  If there are any pets around you, be aware of where they are.  Review your medicines with your doctor. Some medicines can make you feel dizzy. This can increase your chance of falling. Ask your doctor what other things that you can do to help prevent falls. This information is not intended to replace advice given to you by your health care provider. Make sure you discuss any questions you have with your health care provider. Document Released: 02/28/2009 Document Revised: 10/10/2015 Document Reviewed: 06/08/2014 Elsevier Interactive Patient Education  2017 [employer].

## 2019-05-16 ENCOUNTER — Ambulatory Visit (INDEPENDENT_AMBULATORY_CARE_PROVIDER_SITE_OTHER): Payer: Medicare Other

## 2019-05-16 ENCOUNTER — Encounter: Payer: Self-pay | Admitting: Family Medicine

## 2019-05-16 ENCOUNTER — Other Ambulatory Visit: Payer: Self-pay

## 2019-05-16 ENCOUNTER — Ambulatory Visit (INDEPENDENT_AMBULATORY_CARE_PROVIDER_SITE_OTHER): Payer: Medicare Other | Admitting: Family Medicine

## 2019-05-16 VITALS — BP 152/61 | HR 65 | Temp 97.6°F | Ht 64.0 in | Wt 121.2 lb

## 2019-05-16 DIAGNOSIS — M545 Low back pain, unspecified: Secondary | ICD-10-CM

## 2019-05-16 DIAGNOSIS — Z13220 Encounter for screening for lipoid disorders: Secondary | ICD-10-CM | POA: Diagnosis not present

## 2019-05-16 DIAGNOSIS — L989 Disorder of the skin and subcutaneous tissue, unspecified: Secondary | ICD-10-CM | POA: Diagnosis not present

## 2019-05-16 LAB — COMPREHENSIVE METABOLIC PANEL
ALT: 15 U/L (ref 0–35)
AST: 16 U/L (ref 0–37)
Albumin: 3.8 g/dL (ref 3.5–5.2)
Alkaline Phosphatase: 71 U/L (ref 39–117)
BUN: 21 mg/dL (ref 6–23)
CO2: 30 mEq/L (ref 19–32)
Calcium: 9 mg/dL (ref 8.4–10.5)
Chloride: 103 mEq/L (ref 96–112)
Creatinine, Ser: 0.69 mg/dL (ref 0.40–1.20)
GFR: 81.83 mL/min (ref >60.00)
Glucose, Bld: 99 mg/dL (ref 70–99)
Potassium: 3.8 mEq/L (ref 3.5–5.1)
Sodium: 139 mEq/L (ref 135–145)
Total Bilirubin: 0.7 mg/dL (ref 0.2–1.2)
Total Protein: 5.8 g/dL — ABNORMAL LOW (ref 6.0–8.3)

## 2019-05-16 LAB — LIPID PANEL
Cholesterol: 159 mg/dL (ref 0–200)
HDL: 56.2 mg/dL (ref >39.00)
LDL Cholesterol: 83 mg/dL (ref 0–99)
NonHDL: 102.99
Total CHOL/HDL Ratio: 3
Triglycerides: 98 mg/dL (ref 0.0–149.0)
VLDL: 19.6 mg/dL (ref 0.0–40.0)

## 2019-05-16 LAB — CBC
HCT: 45.1 % (ref 36.0–46.0)
Hemoglobin: 15.2 g/dL — ABNORMAL HIGH (ref 12.0–15.0)
MCHC: 33.6 g/dL (ref 30.0–36.0)
MCV: 97 fl (ref 78.0–100.0)
Platelets: 218 10*3/uL (ref 150.0–400.0)
RBC: 4.65 Mil/uL (ref 3.87–5.11)
RDW: 13 % (ref 11.5–15.5)
WBC: 5.9 10*3/uL (ref 4.0–10.5)

## 2019-05-16 MED ORDER — TRIAMCINOLONE ACETONIDE 0.5 % EX CREA: TOPICAL_CREAM | CUTANEOUS | 0 refills | Status: DC

## 2019-05-16 NOTE — Patient Instructions (Signed)
It was very nice to see you today!   We biopsied the spot on your leg.  We will check blood work and an Publishing rights manager.  Also place referral for you to see sports medicine.  I will refill your triamcinolone today.  Take care, Dr Jerline Pain  Please try these tips to maintain a healthy lifestyle:   Eat at least 3 REAL meals and 1-2 snacks per day.  Aim for no more than 5 hours between eating.  If you eat breakfast, please do so within one hour of getting up.    Each meal should contain half fruits/vegetables, one quarter protein, and one quarter carbs (no bigger than a computer mouse)   Cut down on sweet beverages. This includes juice, soda, and sweet tea.     Drink at least 1 glass of water with each meal and aim for at least 8 glasses per day   Exercise at least 150 minutes every week.

## 2019-05-16 NOTE — Progress Notes (Signed)
   Chief Complaint:  Ellen [name] is a 78 y.o. female who presents today with a chief complaint of leg pain.   Assessment/Plan:  Skin Lesion Biopsy performed today.  See below procedure note.  She tolerated well.  Low back pain with left leg pain We will check plain film given chronicity of symptoms.  Also place referral to sports medicine.  Check CBC, CMET, TSH.  Dermatitis Refill triamcinolone.  Preventative Healthcare Check lipid panel.     Subjective:  HPI:   Skin Lesion Located on right leg.  Is been there for couple weeks.  Painful.  Has tried "popping" it with no drainage.  Bleeds when picked at.  Stable over the last couple of weeks.  She is also had ongoing issues with low back pain and left thigh pain.  This has been going on for several months.  She has been working with physical therapy with modest improvement.  She also request refill on triamcinolone today.  She has been using this for dermatitis which is worked well.  ROS: Per HPI  PMH: She reports that she has quit smoking. She has never used smokeless tobacco. She reports current alcohol use. She reports that she does not use drugs.      Objective:  Physical Exam: BP (!) 152/61   Pulse 65   Temp 97.6 F (36.4 C)   Ht 5\' 4"  (1.626 m)   Wt 121 lb 4 oz (55 kg)   SpO2 97%   BMI 20.81 kg/m   Gen: NAD, resting comfortably CV: Regular rate and rhythm with no murmurs appreciated Pulm: Normal work of breathing, clear to auscultation bilaterally with no crackles, wheezes, or rhonchi MSK: No edema, cyanosis, or clubbing noted Skin: Warm, dry.  Approximately 1 cm hyperkeratotic lesion with central eschar on right anterior thigh Neuro: Grossly normal, moves all extremities Psych: Normal affect and thought content  Shave Biopsy Procedure Note  Pre-operative Diagnosis: Suspicious lesion  Post-operative Diagnosis: same  Locations:Right anterior thigh  Indications: Diagnostic  Anesthesia:  Lidocaine 1% with epinephrine without added sodium bicarbonate  Procedure Details  History of allergy to iodine: no  Patient informed of the risks (including bleeding and infection) and benefits of the  procedure and Written informed consent obtained.  The lesion and surrounding area were given a sterile prep using betadyne and draped in the usual sterile fashion. A scalpel was used to shave an area of skin approximately 1cm by 1cm.  Hemostasis achieved with silver nitrate. Antibiotic ointment and a sterile dressing applied.  The specimen was sent for pathologic examination. The patient tolerated the procedure well.  EBL: 3 ml  Condition: Stable  Complications: none.  Plan: 1. Instructed to keep the wound dry and covered for 24-48h and clean thereafter. 2. Warning signs of infection were reviewed.   3. Recommended that the patient use OTC analgesics as needed for pain.       Algis Greenhouse. Jerline Pain, MD 05/16/2019 10:57 AM

## 2019-05-17 ENCOUNTER — Other Ambulatory Visit: Payer: Self-pay | Admitting: Family Medicine

## 2019-05-17 DIAGNOSIS — E2839 Other primary ovarian failure: Secondary | ICD-10-CM

## 2019-05-17 DIAGNOSIS — Z1231 Encounter for screening mammogram for malignant neoplasm of breast: Secondary | ICD-10-CM

## 2019-05-17 NOTE — Progress Notes (Signed)
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 Would like for her to follow up with sports medicine as we discussed.

## 2019-05-17 NOTE — Progress Notes (Signed)
Please inform patient of the following:  Blood work is all NORMAL.  Would like for her to keep up the good work and we can recheck in a year or so.  Algis Greenhouse. Jerline Pain, MD 05/17/2019 8:10 AM

## 2019-05-22 ENCOUNTER — Telehealth: Payer: Self-pay | Admitting: Family Medicine

## 2019-05-22 ENCOUNTER — Encounter: Payer: Self-pay | Admitting: Family Medicine

## 2019-05-22 ENCOUNTER — Ambulatory Visit (INDEPENDENT_AMBULATORY_CARE_PROVIDER_SITE_OTHER): Payer: Medicare Other

## 2019-05-22 ENCOUNTER — Ambulatory Visit (INDEPENDENT_AMBULATORY_CARE_PROVIDER_SITE_OTHER): Payer: Medicare Other | Admitting: Family Medicine

## 2019-05-22 ENCOUNTER — Other Ambulatory Visit: Payer: Self-pay

## 2019-05-22 VITALS — BP 158/78 | HR 70 | Ht 64.0 in | Wt 122.4 lb

## 2019-05-22 DIAGNOSIS — M79652 Pain in left thigh: Secondary | ICD-10-CM | POA: Diagnosis not present

## 2019-05-22 DIAGNOSIS — M1612 Unilateral primary osteoarthritis, left hip: Secondary | ICD-10-CM | POA: Diagnosis not present

## 2019-05-22 NOTE — Progress Notes (Signed)
Subjective:    I'm seeing this patient as a consultation for:  Dr. Jerline Pain. Note will be routed back to referring provider/PCP.  CC: Low back pain and L thigh pain  I, Molly Weber, LAT, ATC, am serving as scribe for Dr. Lynne Leader.  HPI: Pt is an 79 y/o female presenting w/ c/o low back pain and L thigh pain x 3 months.  She was seen by her PCP on 05/16/19 and had an L-spine XR.  Pt has attended PT x 4 visits in October 2020 w/ moderate improvement in her symptoms.   Currently, pt reports L ant thigh pain and some limited L-sided LBP.  She states that she only has pain in the morning when she first gets up but pain subsides in approximately 15 min.  Pt describes her L ant thigh pain as a sharp 6/10.  She denies any numbness/tingling into the L LE.    Past medical history, Surgical history, Family history not pertinant except as noted below, Social history, Allergies, and medications have been entered into the medical record, reviewed, and no changes needed.   Review of Systems: No headache, visual changes, nausea, vomiting, diarrhea, constipation, dizziness, abdominal pain, skin rash, fevers, chills, night sweats, weight loss, swollen lymph nodes, body aches, joint swelling, muscle aches, chest pain, shortness of breath, mood changes, visual or auditory hallucinations.   Objective:    Vitals:   05/22/19 0919  BP: (!) 158/78  Pulse: 70  SpO2: 97%   General: Well Developed, well nourished, and in no acute distress.  Neuro/Psych: Alert and oriented x3, extra-ocular muscles intact, able to move all 4 extremities, sensation grossly intact. Skin: Warm and dry, no rashes noted.  Respiratory: Not using accessory muscles, speaking in full sentences, trachea midline.  Cardiovascular: Pulses palpable, no extremity edema. Abdomen: Does not appear distended. MSK:  L-spine: Relatively normal-appearing Nontender to spinal midline. Range of motion limited extension and flexion.  Normal rotation and  lateral flexion bilaterally. Lower extremity strength is equal normal throughout bilateral extremities. Reflexes equal. Sensation intact throughout.  Left hip normal-appearing Normal motion. Normal strength.  Nontender.  Normal gait.  Lab and Radiology Results  DG Lumbar Spine Complete  Result Date: 05/16/2019 CLINICAL DATA:  Low back pain EXAM: LUMBAR SPINE - COMPLETE 4+ VIEW COMPARISON:  None. FINDINGS: Five lumbar type vertebral bodies are well visualized. Scoliosis is noted concave to the left. Vertebral body height is well maintained. Facet hypertrophic changes are noted. No significant anterolisthesis is seen. No soft tissue abnormality is noted. IMPRESSION: Multilevel degenerative changes without acute abnormality. Electronically Signed   By: Inez Catalina M.D.   On: 05/16/2019 13:32   EXAM: DG HIP (WITH OR WITHOUT PELVIS) 2-3V LEFT 10/27/18  COMPARISON:  None.  FINDINGS: There is no evidence of hip fracture or dislocation. No significant joint space narrowing is noted, although mild osteophyte formation is seen involving the left hip.  IMPRESSION: Mild degenerative joint disease of the left hip. No acute abnormality is noted.   Electronically Signed   By: Marijo Conception M.D.   On: 10/27/2018 14:03  DG FEMUR MIN 2 VIEWS LEFT  Result Date: 05/22/2019 CLINICAL DATA:  Left leg pain, atraumatic EXAM: LEFT FEMUR 2 VIEWS COMPARISON:  11/26/2018 FINDINGS: Hip osteoarthritis with joint narrowing and spurring. There is chondrocalcinosis versus osteochondral bodies at the lateral hip joint. No evidence of fracture or aggressive bone lesion. Arterial calcification. Generalized osteopenic appearance. IMPRESSION: 1. Hip osteoarthritis. 2. Atherosclerosis and osteopenia. Electronically Signed  By: Monte Fantasia M.D.   On: 05/22/2019 10:30     I, Lynne Leader, personally (independently) visualized and performed the interpretation of the images attached in this  note.      Impression and Recommendations:    Assessment and Plan: 79 y.o. female with  Left thigh pain ongoing for about 2 to 3 months occurring for 15 minutes in the morning and resolving spontaneously. Fundamental cause of at this time is somewhat unclear.  However L3 radiculopathy is most likely explanation.  Patient has considerable degenerative changes seen on x-ray. Referred hip pain is also a possibility however provocative exam maneuvers negative today.   X-ray femur today is largely normal..  Radiology over read is still pending however  Discussed options.  Patient has short duration of pain that somewhat limited.  It is reasonable to proceed with a watchful waiting approach.  We will plan for limited physical therapy for lumbar radiculopathy.  If not improving could consider MRI for epidural steroid injection planning if needed however patient notes that at this point she would like to hold off on that.    Orders Placed This Encounter  Procedures  . DG FEMUR MIN 2 VIEWS LEFT    Standing Status:   Future    Number of Occurrences:   1    Standing Expiration Date:   07/19/2020    Order Specific Question:   Reason for Exam (SYMPTOM  OR DIAGNOSIS REQUIRED)    Answer:   eval left leg pain    Order Specific Question:   Preferred imaging location?    Answer:   [facility]    Order Specific Question:   Radiology Contrast Protocol - do NOT remove file path    Answer:   [email or web address]   No orders of the defined types were placed in this encounter.   Discussed warning signs or symptoms. Please see discharge instructions. Patient expresses understanding.   The above documentation has been reviewed and is accurate and complete Lynne Leader

## 2019-05-22 NOTE — Progress Notes (Signed)
X-ray femur shows arthritis at the hip and possible pseudogout in the hip however no fractures or tumors present.  There is a potential for medication that you could take for pseudogout that may help some.  Are you interested in trying the colchicine which may help?

## 2019-05-22 NOTE — Patient Instructions (Addendum)
Thank you for coming in today. Get xray of your thigh today.  Ok to consider trial of PT to focus on lumbar rachiopathy.  Ok to plan for MRI for injection planning if needed.  Mychart is a great way to communicate with me  If needed.   I will plan for PT if xray femur looks normal.    Radicular Pain Radicular pain is a type of pain that spreads from your back or neck along a spinal nerve. Spinal nerves are nerves that leave the spinal cord and go to the muscles. Radicular pain is sometimes called radiculopathy, radiculitis, or a pinched nerve. When you have this type of pain, you may also have weakness, numbness, or tingling in the area of your body that is supplied by the nerve. The pain may feel sharp and burning. Depending on which spinal nerve is affected, the pain may occur in the:  Neck area (cervical radicular pain). You may also feel pain, numbness, weakness, or tingling in the arms.  Mid-spine area (thoracic radicular pain). You would feel this pain in the back and chest. This type is rare.  Lower back area (lumbar radicular pain). You would feel this pain as low back pain. You may feel pain, numbness, weakness, or tingling in the buttocks or legs. Sciatica is a type of lumbar radicular pain that shoots down the back of the leg. Radicular pain occurs when one of the spinal nerves becomes irritated or squeezed (compressed). It is often caused by something pushing on a spinal nerve, such as one of the bones of the spine (vertebrae) or one of the round cushions between vertebrae (intervertebral disks). This can result from:  An injury.  Wear and tear or aging of a disk.  The growth of a bone spur that pushes on the nerve. Radicular pain often goes away when you follow instructions from your health care provider for relieving pain at home. Follow these instructions at home: Managing pain      If directed, put ice on the affected area: ? Put ice in a plastic bag. ? Place a towel  between your skin and the bag. ? Leave the ice on for 20 minutes, 2-3 times a day.  If directed, apply heat to the affected area as often as told by your health care provider. Use the heat source that your health care provider recommends, such as a moist heat pack or a heating pad. ? Place a towel between your skin and the heat source. ? Leave the heat on for 20-30 minutes. ? Remove the heat if your skin turns bright red. This is especially important if you are unable to feel pain, heat, or cold. You may have a greater risk of getting burned. Activity   Do not sit or rest in bed for long periods of time.  Try to stay as active as possible. Ask your health care provider what type of exercise or activity is best for you.  Avoid activities that make your pain worse, such as bending and lifting.  Do not lift anything that is heavier than 10 lb (4.5 kg), or the limit that you are told, until your health care provider says that it is safe.  Practice using proper technique when lifting items. Proper lifting technique involves bending your knees and rising up.  Do strength and range-of-motion exercises only as told by your health care provider or physical therapist. General instructions  Take over-the-counter and prescription medicines only as told by your health  care provider.  Pay attention to any changes in your symptoms.  Keep all follow-up visits as told by your health care provider. This is important. ? Your health care provider may send you to a physical therapist to help with this pain. Contact a health care provider if:  Your pain and other symptoms get worse.  Your pain medicine is not helping.  Your pain has not improved after a few weeks of home care.  You have a fever. Get help right away if:  You have severe pain, weakness, or numbness.  You have difficulty with bladder or bowel control. Summary  Radicular pain is a type of pain that spreads from your back or neck  along a spinal nerve.  When you have radicular pain, you may also have weakness, numbness, or tingling in the area of your body that is supplied by the nerve.  The pain may feel sharp or burning.  Radicular pain may be treated with ice, heat, medicines, or physical therapy. This information is not intended to replace advice given to you by your health care provider. Make sure you discuss any questions you have with your health care provider. Document Revised: 11/16/2017 Document Reviewed: 11/16/2017 Elsevier Patient Education  [place].

## 2019-05-22 NOTE — Telephone Encounter (Signed)
Pt has a lesion on right thigh and lab work came back as cancerous, pt states Dr Jerline Pain could deal with it. She is wanting a call back for an appt ASAP  [phone]  Pt had called multiple times today 1/4 pls FU

## 2019-05-22 NOTE — Telephone Encounter (Signed)
Team, can we please call patient and inform her that her biopsy showed squamous cell carcinoma. This is a type of skin cancer. Recommend referral to dermatology vs coming back here for full excisional biopsy.  Algis Greenhouse. Jerline Pain, MD 05/22/2019 1:15 PM

## 2019-05-23 ENCOUNTER — Telehealth: Payer: Self-pay | Admitting: Family Medicine

## 2019-05-23 ENCOUNTER — Other Ambulatory Visit: Payer: Self-pay

## 2019-05-23 DIAGNOSIS — C44722 Squamous cell carcinoma of skin of right lower limb, including hip: Secondary | ICD-10-CM

## 2019-05-23 MED ORDER — COLCHICINE 0.6 MG PO TABS: 0.6000 mg | ORAL_TABLET | Freq: Every day | ORAL | 2 refills | Status: DC | PRN

## 2019-05-23 NOTE — Telephone Encounter (Signed)
Colchicine prescribed

## 2019-05-23 NOTE — Telephone Encounter (Signed)
Patient notified and voices understanding.Referral Placed

## 2019-05-23 NOTE — Telephone Encounter (Signed)
-----   Message from Wendy Poet, LAT sent at 05/23/2019  1:34 PM EST ----- Called and spoke to pt regarding her L femur XR and result note.  Ask pt if she would like to proceed w/ the chochicine medication and after a conversation regarding pseudogout, pt states that yes, she would like to try the cholchicine.  Preferred pharmacy is [facility] at [address].

## 2019-05-23 NOTE — Telephone Encounter (Signed)
See note

## 2019-05-23 NOTE — Telephone Encounter (Signed)
Left voice message for patient to call clinic.  

## 2019-05-30 ENCOUNTER — Other Ambulatory Visit: Payer: Self-pay

## 2019-05-31 ENCOUNTER — Ambulatory Visit (INDEPENDENT_AMBULATORY_CARE_PROVIDER_SITE_OTHER): Payer: Medicare Other | Admitting: Family Medicine

## 2019-05-31 ENCOUNTER — Other Ambulatory Visit: Payer: Self-pay

## 2019-05-31 ENCOUNTER — Encounter: Payer: Self-pay | Admitting: Family Medicine

## 2019-05-31 VITALS — BP 134/68 | HR 69 | Temp 97.3°F | Ht 64.0 in | Wt 121.2 lb

## 2019-05-31 DIAGNOSIS — C44722 Squamous cell carcinoma of skin of right lower limb, including hip: Secondary | ICD-10-CM

## 2019-05-31 NOTE — Progress Notes (Signed)
   Ellen [name] is a 79 y.o. female who presents today for an office visit.  Assessment/Plan:  Squamous Cell Carcinoma Excisional biopsy form today.  Tolerated well.  Pathology sent. Can use over-the-counter meds if needed for pain.  Discussed warning signs of infection and reasons to return to care.  She will follow-up in 1 to 2 weeks for suture removal.    Subjective:  HPI:  Patient seen 2 weeks ago.  Had shave biopsy performed which revealed squamous cell carcinoma. She would like to have the lesion completely removed.        Objective:  Physical Exam: BP 134/68   Pulse 69   Temp (!) 97.3 F (36.3 C)   Ht 5\' 4"  (1.626 m)   Wt 121 lb 4 oz (55 kg)   SpO2 97%   BMI 20.81 kg/m   Gen: No acute distress, resting comfortably CV: Regular rate and rhythm with no murmurs appreciated Pulm: Normal work of breathing, clear to auscultation bilaterally with no crackles, wheezes, or rhonchi Skin: Approximately 2.1 cm diameter lesion on right anterior thigh Neuro: Grossly normal, moves all extremities Psych: Normal affect and thought content  Excisional Biopsy Procedure Note  Pre-operative Diagnosis: Squamous cell carcinoma  Post-operative Diagnosis: same  Locations: Right Thigh  Indications: Diagnostic  Anesthesia: Lidocaine 1% with epinephrine without added sodium bicarbonate  Procedure Details  The risks, benefits, indications, potential complications, and alternatives were explained to the patient and informed consent obtained.  The lesion and surrounding area was given a sterile prep using betadyne and draped in the usual sterile fashion. A scalpel was used to excise an elliptical area of skin approximately 2.5cm by 7cm. The wound was closed with 3-0 Prolene using simple interrupted stitches. Antibiotic ointment and a sterile dressing applied. The specimen was sent for pathologic examination. The patient tolerated the procedure well.  EBL: minimal  Condition:  Stable  Complications:  None  Plan: 1. Instructed to keep the wound dry and covered for 24-48 hours and clean thereafter. 2. Warning signs of infection were reviewed.   3. Recommended that the patient use OTC analgesics as needed for pain.  4. Return for suture removal in 1 week.       Algis Greenhouse. Jerline Pain, MD 05/31/2019 11:05 AM

## 2019-05-31 NOTE — Patient Instructions (Signed)
Skin Biopsy, Care After This sheet gives you information about how to care for yourself after your procedure. Your health care provider may also give you more specific instructions. If you have problems or questions, contact your health care provider. What can I expect after the procedure? After the procedure, it is common to have:  Soreness.  Bruising.  Itching. Follow these instructions at home: Biopsy site care Follow instructions from your health care provider about how to take care of your biopsy site. Make sure you:  Wash your hands with soap and water before and after you change your bandage (dressing). If soap and water are not available, use hand sanitizer.  Apply ointment on your biopsy site as directed by your health care provider.  Change your dressing as told by your health care provider.  Leave stitches (sutures), skin glue, or adhesive strips in place. These skin closures may need to stay in place for 2 weeks or longer. If adhesive strip edges start to loosen and curl up, you may trim the loose edges. Do not remove adhesive strips completely unless your health care provider tells you to do that.  If the biopsy area bleeds, apply gentle pressure for 10 minutes. Check your biopsy site every day for signs of infection. Check for:  Redness, swelling, or pain.  Fluid or blood.  Warmth.  Pus or a bad smell.  General instructions  Rest and then return to your normal activities as told by your health care provider.  Take over-the-counter and prescription medicines only as told by your health care provider.  Keep all follow-up visits as told by your health care provider. This is important. Contact a health care provider if:  You have redness, swelling, or pain around your biopsy site.  You have fluid or blood coming from your biopsy site.  Your biopsy site feels warm to the touch.  You have pus or a bad smell coming from your biopsy site.  You have a  fever.  Your sutures, skin glue, or adhesive strips loosen or come off sooner than expected. Get help right away if:  You have bleeding that does not stop with pressure or a dressing. Summary  After the procedure, it is common to have soreness, bruising, and itching at the site.  Follow instructions from your health care provider about how to take care of your biopsy site.  Check your biopsy site every day for signs of infection.  Contact a health care provider if you have redness, swelling, or pain around your biopsy site, or your biopsy site feels warm to the touch.  Keep all follow-up visits as told by your health care provider. This is important. This information is not intended to replace advice given to you by your health care provider. Make sure you discuss any questions you have with your health care provider. Document Revised: 11/01/2017 Document Reviewed: 11/01/2017 Elsevier Patient Education  2020 [employer].  

## 2019-06-06 ENCOUNTER — Other Ambulatory Visit: Payer: Self-pay

## 2019-06-07 ENCOUNTER — Encounter: Payer: Self-pay | Admitting: Family Medicine

## 2019-06-07 ENCOUNTER — Ambulatory Visit (INDEPENDENT_AMBULATORY_CARE_PROVIDER_SITE_OTHER): Payer: Medicare Other | Admitting: Family Medicine

## 2019-06-07 VITALS — BP 152/66 | HR 61 | Temp 97.0°F | Ht 64.0 in | Wt 120.4 lb

## 2019-06-07 DIAGNOSIS — C44722 Squamous cell carcinoma of skin of right lower limb, including hip: Secondary | ICD-10-CM

## 2019-06-07 DIAGNOSIS — Z4802 Encounter for removal of sutures: Secondary | ICD-10-CM

## 2019-06-07 NOTE — Progress Notes (Signed)
   Ellen [name] is a 79 y.o. female who presents today for an office visit.  Assessment/Plan:  New/Acute Problems: Squamous Cell Carcinoma Pathology report shows clear margins.  Informed patient and gave copy of pathology report.  Discussed warning signs and reasons to return to care.    Subjective:  HPI:  Squamous cell carcinoma follow-up Patient seen a week ago had excisional biopsy performed.  Tolerated well.  She is today for suture removal.  Wound has had a little bit of pain at the proximal edge but is otherwise done well.  No excessive redness.  No drainage.       Objective:  Physical Exam: BP (!) 152/66   Pulse 61   Temp (!) 97 F (36.1 C)   Ht 5\' 4"  (1.626 m)   Wt 120 lb 6.1 oz (54.6 kg)   SpO2 98%   BMI 20.66 kg/m   Gen: No acute distress, resting comfortably Skin: Approximately 8 cm linear surgical wound with mild amount of surrounding erythema.  Patient sutures were removed without difficulty.  Small amount of Dermabond was placed after sutures were removed.     Algis Greenhouse. Jerline Pain, MD 06/07/2019 9:31 AM

## 2019-06-07 NOTE — Patient Instructions (Signed)
It was very nice to see you today!  We took out your stitches today.  Your biopsy report showed that we were able to remove all your skin cancer.  We placed skin glue on the wound.  This should fall off naturally over the next 1 to 2 weeks.  Please let me know if you have any signs of infection.  Take care, Dr Jerline Pain  Please try these tips to maintain a healthy lifestyle:   Eat at least 3 REAL meals and 1-2 snacks per day.  Aim for no more than 5 hours between eating.  If you eat breakfast, please do so within one hour of getting up.    Each meal should contain half fruits/vegetables, one quarter protein, and one quarter carbs (no bigger than a computer mouse)   Cut down on sweet beverages. This includes juice, soda, and sweet tea.     Drink at least 1 glass of water with each meal and aim for at least 8 glasses per day   Exercise at least 150 minutes every week.    Tissue Adhesive Wound Care Some cuts and wounds can be closed with skin glue (tissue adhesive). Skin glue holds the skin together and helps your wound heal faster. Skin glue goes away on its own as your wound gets better. Follow these instructions at home:  Wound care  Showers are allowed 24 hours after treatment. Do not soak the wound in water. Do not take baths, swim, or use hot tubs. Do not use soaps or creams on your wound.  If a bandage (dressing) was put on the wound: ? Wash your hands with soap and water before you change your bandage. ? Change the bandage as often as told by your doctor. ? Leave skin glue in place. It will fall off on its own after 7-10 days. ? Keep the bandage dry.  Do not scratch, rub, or pick at the skin glue.  Do not put tape over the skin glue. The skin glue could come off when you take the tape off.  Protect the wound from another injury.  Protect the wound from sun and tanning beds. General instructions  Take over-the-counter and prescription medicines only as told by your  doctor.  Keep all follow-up visits as told by your doctor. This is important. Get help right away if:  Your wound is red, puffy (swollen), hot, or tender.  You get a rash after the glue is put on.  You have more pain in the wound.  You have a red streak going away from the wound.  You have yellowish-white fluid (pus) coming from the wound.  You have more bleeding.  You have a fever.  You have chills and you start to shake.  You notice a bad smell coming from the wound.  Your wound or skin glue breaks open. This information is not intended to replace advice given to you by your health care provider. Make sure you discuss any questions you have with your health care provider. Document Revised: 04/16/2017 Document Reviewed: 03/27/2016 Elsevier Patient Education  2020 [employer].

## 2019-07-11 DIAGNOSIS — H2513 Age-related nuclear cataract, bilateral: Secondary | ICD-10-CM | POA: Diagnosis not present

## 2019-07-15 ENCOUNTER — Other Ambulatory Visit: Payer: Self-pay | Admitting: Family Medicine

## 2019-08-08 ENCOUNTER — Ambulatory Visit: Payer: Medicare Other

## 2019-08-08 ENCOUNTER — Ambulatory Visit
Admission: RE | Admit: 2019-08-08 | Discharge: 2019-08-08 | Disposition: A | Discharge status: Home / Self Care | Payer: Medicare Other | Source: Ambulatory Visit | Attending: Family Medicine | Admitting: Family Medicine

## 2019-08-08 ENCOUNTER — Other Ambulatory Visit: Payer: Self-pay

## 2019-08-08 DIAGNOSIS — Z1231 Encounter for screening mammogram for malignant neoplasm of breast: Secondary | ICD-10-CM | POA: Diagnosis not present

## 2019-08-08 DIAGNOSIS — Z780 Asymptomatic menopausal state: Secondary | ICD-10-CM | POA: Diagnosis not present

## 2019-08-08 DIAGNOSIS — Z13820 Encounter for screening for osteoporosis: Secondary | ICD-10-CM | POA: Diagnosis not present

## 2019-08-08 DIAGNOSIS — E2839 Other primary ovarian failure: Secondary | ICD-10-CM

## 2019-08-10 NOTE — Progress Notes (Signed)
Please inform patient of the following:  Bone density scan is NORMAL.  Algis Greenhouse. Jerline Pain, MD 08/10/2019 8:11 AM

## 2019-08-11 ENCOUNTER — Telehealth (INDEPENDENT_AMBULATORY_CARE_PROVIDER_SITE_OTHER): Payer: Medicare Other | Admitting: Family Medicine

## 2019-08-11 ENCOUNTER — Encounter: Payer: Self-pay | Admitting: Family Medicine

## 2019-08-11 DIAGNOSIS — Z712 Person consulting for explanation of examination or test findings: Secondary | ICD-10-CM

## 2019-08-11 NOTE — Progress Notes (Signed)
   Ellen [name] is a 79 y.o. female who presents today for a virtual office visit.  Assessment/Plan:  Preventative Healthcare:  Discussed questions and concerns with patient.  Reviewed her recent bone density scan in depth.  Reassured patient that she has normal bone density.  Discussed importance of vitamin D and calcium in her diet.  She is not taking any dairy.  She will start taking dietary supplement to optimize vitamin D and calcium intake.  Also discussed recent normal mammogram.  Discussed Covid vaccine.  They were able to get through first dose already and will be getting second dose next week.     Subjective:  HPI:  Patient presents today with questions about lab test that she had done a few days ago.  Recently underwent bone density scan and mammogram.  T score was -0.2.  Mammogram was normal.  Patient is not taking any dietary supplements.  Does not take very much dairy either.       Objective/Observations  Physical Exam: Gen: NAD, resting comfortably Pulm: Normal work of breathing Neuro: Grossly normal, moves all extremities Psych: Normal affect and thought content  Virtual Visit via Video   I connected with Ellen [name] on 08/11/19 at 11:20 AM EDT by a video enabled telemedicine application and verified that I am speaking with the correct person using two identifiers. The limitations of evaluation and management by telemedicine and the availability of in person appointments were discussed. The patient expressed understanding and agreed to proceed.   Patient location: Home Provider location: [place] [facility] Persons participating in the virtual visit: Myself and Patient  Time Spent: 20 minutes of total time was spent on the date of the encounter performing the following actions: chart review prior to seeing the patient, obtaining history, performing a medically necessary exam, counseling on the treatment plan, placing orders,  and documenting in our EHR.       Algis Greenhouse. Jerline Pain, MD 08/11/2019 11:27 AM

## 2019-10-23 ENCOUNTER — Other Ambulatory Visit: Payer: Self-pay

## 2019-10-23 ENCOUNTER — Ambulatory Visit (INDEPENDENT_AMBULATORY_CARE_PROVIDER_SITE_OTHER): Payer: Medicare Other | Admitting: Family Medicine

## 2019-10-23 ENCOUNTER — Encounter: Payer: Self-pay | Admitting: Family Medicine

## 2019-10-23 VITALS — BP 154/68 | HR 62 | Temp 96.4°F | Ht 64.0 in | Wt 116.0 lb

## 2019-10-23 DIAGNOSIS — R600 Localized edema: Secondary | ICD-10-CM

## 2019-10-23 MED ORDER — FUROSEMIDE 20 MG PO TABS: 20.0000 mg | ORAL_TABLET | Freq: Every day | ORAL | 3 refills | Status: DC

## 2019-10-23 NOTE — Progress Notes (Signed)
   Ellen [name] is a 79 y.o. female who presents today for an office visit.  Assessment/Plan:  Chronic Problems Addressed Today: Leg edema Worsened today.  Has history of lower extremity edema due to venous insufficiency.  Low suspicion for DVT-Wells score -1.  No signs or signs concerning for cardiac etiology.  Recommended leg elevation, compression stockings, and salt avoidance.  We will also start low-dose Lasix for the next 2 days, then as needed.  Discussed reasons to return to care.     Subjective:  HPI:  Patient with worsening right lower leg swelling for the past 2 days.  Has improved some today.  She has been on Lasix rest in the past.  No obvious precipitating events.  No chest pain or shortness of breath.  No orthopnea.  No pain.  No redness.  No specific treatments tried.       Objective:  Physical Exam: BP (!) 154/68   Pulse 62   Temp (!) 96.4 F (35.8 C)   Ht 5\' 4"  (1.626 m)   Wt 116 lb (52.6 kg)   SpO2 97%   BMI 19.91 kg/m   Gen: No acute distress, resting comfortably CV: Regular rate and rhythm with no murmurs appreciated Pulm: Normal work of breathing, clear to auscultation bilaterally with no crackles, wheezes, or rhonchi MSK: Right lower extremity with 2+ edema.  Distal pulses intact. Neuro: Grossly normal, moves all extremities Psych: Normal affect and thought content      Destin Vinsant M. Jerline Pain, MD 10/23/2019 1:31 PM

## 2019-10-23 NOTE — Patient Instructions (Signed)
It was very nice to see you today!  Please use the Lasix every day for the next few days and then as needed.  Please keep your legs elevated as much as possible.  You can also use compression sleeve if needed.  Please avoid salt.  Let me know if your symptoms worsen or not improving in the next week or so.  Take care, Dr Jerline Pain  Please try these tips to maintain a healthy lifestyle:   Eat at least 3 REAL meals and 1-2 snacks per day.  Aim for no more than 5 hours between eating.  If you eat breakfast, please do so within one hour of getting up.    Each meal should contain half fruits/vegetables, one quarter protein, and one quarter carbs (no bigger than a computer mouse)   Cut down on sweet beverages. This includes juice, soda, and sweet tea.     Drink at least 1 glass of water with each meal and aim for at least 8 glasses per day   Exercise at least 150 minutes every week.

## 2019-10-23 NOTE — Assessment & Plan Note (Signed)
Worsened today.  Has history of lower extremity edema due to venous insufficiency.  Low suspicion for DVT-Wells score -1.  No signs or signs concerning for cardiac etiology.  Recommended leg elevation, compression stockings, and salt avoidance.  We will also start low-dose Lasix for the next 2 days, then as needed.  Discussed reasons to return to care.

## 2019-12-02 ENCOUNTER — Other Ambulatory Visit: Payer: Self-pay | Admitting: Family Medicine

## 2020-01-13 ENCOUNTER — Other Ambulatory Visit: Payer: Self-pay | Admitting: Family Medicine

## 2020-01-30 ENCOUNTER — Other Ambulatory Visit: Payer: Self-pay

## 2020-01-30 ENCOUNTER — Encounter: Payer: Self-pay | Admitting: Family Medicine

## 2020-01-30 ENCOUNTER — Ambulatory Visit (INDEPENDENT_AMBULATORY_CARE_PROVIDER_SITE_OTHER): Payer: Medicare Other | Admitting: Family Medicine

## 2020-01-30 VITALS — BP 145/77 | HR 68 | Temp 98.1°F | Ht 64.0 in | Wt 114.6 lb

## 2020-01-30 DIAGNOSIS — I10 Essential (primary) hypertension: Secondary | ICD-10-CM

## 2020-01-30 DIAGNOSIS — M21619 Bunion of unspecified foot: Secondary | ICD-10-CM

## 2020-01-30 DIAGNOSIS — K5790 Diverticulosis of intestine, part unspecified, without perforation or abscess without bleeding: Secondary | ICD-10-CM

## 2020-01-30 NOTE — Patient Instructions (Signed)
It was very nice to see you today!  I will place a referral for you to see the orthopedist for your foot.  I will also place a referral for you to see a GI doctor for your diverticulosis.  Take care, Dr Jerline Pain  Please try these tips to maintain a healthy lifestyle:   Eat at least 3 REAL meals and 1-2 snacks per day.  Aim for no more than 5 hours between eating.  If you eat breakfast, please do so within one hour of getting up.    Each meal should contain half fruits/vegetables, one quarter protein, and one quarter carbs (no bigger than a computer mouse)   Cut down on sweet beverages. This includes juice, soda, and sweet tea.     Drink at least 1 glass of water with each meal and aim for at least 8 glasses per day   Exercise at least 150 minutes every week.

## 2020-01-30 NOTE — Assessment & Plan Note (Signed)
Discussed course of antibiotics however patient declined.  Will place referral to GI.

## 2020-01-30 NOTE — Progress Notes (Signed)
   Ellen [name] is a 79 y.o. female who presents today for an office visit.  Assessment/Plan:  New/Acute Problems: Bunion Symptomatic.  Will place referral to orthopedics for further management.  Chronic Problems Addressed Today: Diverticulosis Discussed course of antibiotics however patient declined.  Will place referral to GI.  Hypertension At goal per JNC 8.  Continue losartan-HCTZ 50-12.5 once daily     Subjective:  HPI:  Patient here with concern for diverticulosis flare.  Worse over the last few months.  Has occasional loose stools.  No bloody stool.  Has history of severe diverticulitis in the past that resulted in bowel perforation and ended up having small bowel resection.  No obvious reasons for flare.  She has had some mild left lower quadrant pain.  No fevers or chills.  No nausea or vomiting.  No obvious precipitating events.  She also has a bunion on the left great toe.  She would like to have this removed if possible.       Objective:  Physical Exam: BP (!) 145/77   Pulse 68   Temp 98.1 F (36.7 C) (Temporal)   Ht 5\' 4"  (1.626 m)   Wt 114 lb 9.6 oz (52 kg)   SpO2 97%   BMI 19.67 kg/m   Gen: No acute distress, resting comfortably CV: Regular rate and rhythm with no murmurs appreciated Pulm: Normal work of breathing, clear to auscultation bilaterally with no crackles, wheezes, or rhonchi GI: Hyperactive bowel sounds.  Mildly tender to palpation in left lower quadrant.  No rebound or guarding MSK: Bunion on left great toe Neuro: Grossly normal, moves all extremities Psych: Normal affect and thought content      Trace Wirick M. Jerline Pain, MD 01/30/2020 12:06 PM

## 2020-01-30 NOTE — Assessment & Plan Note (Signed)
At goal per JNC 8.  Continue losartan-HCTZ 50-12.5 once daily. 

## 2020-02-05 ENCOUNTER — Ambulatory Visit: Payer: Self-pay

## 2020-02-05 ENCOUNTER — Encounter: Payer: Self-pay | Admitting: Orthopedic Surgery

## 2020-02-05 ENCOUNTER — Ambulatory Visit (INDEPENDENT_AMBULATORY_CARE_PROVIDER_SITE_OTHER): Payer: Medicare Other | Admitting: Orthopedic Surgery

## 2020-02-05 VITALS — Ht 64.0 in | Wt 114.0 lb

## 2020-02-05 DIAGNOSIS — M79671 Pain in right foot: Secondary | ICD-10-CM | POA: Diagnosis not present

## 2020-02-05 DIAGNOSIS — M2021 Hallux rigidus, right foot: Secondary | ICD-10-CM

## 2020-02-06 ENCOUNTER — Encounter: Payer: Self-pay | Admitting: Orthopedic Surgery

## 2020-02-06 LAB — URIC ACID: Uric Acid, Serum: 5.2 mg/dL (ref 2.5–7.0)

## 2020-02-06 NOTE — Progress Notes (Signed)
Office Visit Note   Patient: Ellen [name]           Date of Birth: 21-Aug-1940           MRN: [id number] Visit Date: 02/05/2020              Requested by: Vivi Barrack, MD [address] [place],  [place] [address] PCP: Vivi Barrack, MD  Chief Complaint  Patient presents with  . Right Foot - Pain      HPI: Patient is an 79 year old woman who complains of severe pain of the right great toe MTP joint.  Patient states she does have a history of gout is currently not on medications.  Assessment & Plan: Visit Diagnoses:  1. Pain in right foot   2. Hallux rigidus, right foot     Plan: We will draw a uric acid.  The uric acid results have returned and she has a normal uric acid level.  With the advanced collapse of the MTP joint discussed that her optimal treatment would be a fusion of the MTP joint.  Patient states she is currently in her gardening season and will call us when she is ready to schedule surgery.  Risk and benefits were discussed including infection neurovascular injury persistent pain nonunion need for additional surgery.  Follow-Up Instructions: Return if symptoms worsen or fail to improve.   Ortho Exam  Patient is alert, oriented, no adenopathy, well-dressed, normal affect, normal respiratory effort. Examination patient does have venous stasis swelling with pitting edema of the right leg the calf is 31 cm in circumference there is pitting edema both legs she has a good dorsalis pedis pulse she has essentially no range of motion of the right great toe MTP joint with large osteophytic bone spurs she has clawing of the lesser toes which is fixed.  She states she is an ex-smoker.  Imaging: XR Foot 2 Views Right  Result Date: 02/06/2020 There is2 view radiographs of the right foot shows a long second third and fourth metatarsal is degenerative changes across the base of the first metatarsal medial cuneiform and base of the second met tarsal middle  cuneiform and advanced collapse of the first metatarsal head with complete collapse and bone-on-bone at the MTP joint of the great toe  No images are attached to the encounter.  Labs: Lab Results  Component Value Date   HGBA1C 5.4 10/05/2017   LABURIC 5.2 02/05/2020   REPTSTATUS 07/10/2013 FINAL 07/09/2013   GRAMSTAIN  07/09/2013    ABUNDANT WBC PRESENT, PREDOMINANTLY PMN FEW GRAM NEGATIVE RODS Performed at [facility]  07/09/2013    ABUNDANT WBC PRESENT, PREDOMINANTLY PMN MODERATE GRAM NEGATIVE RODS Performed at [facility] NO GROWTH Performed at [facility] 07/09/2013   [place] 07/09/2013     Lab Results  Component Value Date   ALBUMIN 3.8 05/16/2019   ALBUMIN 4.0 10/21/2016   ALBUMIN 3.8 01/10/2015   LABURIC 5.2 02/05/2020    No results found for: MG No results found for: VD25OH  No results found for: PREALBUMIN CBC EXTENDED Latest Ref Rng & Units 05/16/2019 04/07/2017 10/21/2016  WBC 4.0 - 10.5 K/uL 5.9 7.0 6.3  RBC 3.87 - 5.11 Mil/uL 4.65 - 5.00  HGB 12.0 - 15.0 g/dL 15.2(H) 15.8 16.4(H)  HCT 36 - 46 % 45.1 48(A) 48.8(H)  PLT 150 - 400 K/uL 218.0 273 313  NEUTROABS 1.7 - 7.7 K/uL - - -  LYMPHSABS 0.7 - 4.0 K/uL - - -     Body mass index is 19.57 kg/m.  Orders:  Orders Placed This Encounter  Procedures  . XR Foot 2 Views Right  . Uric acid   No orders of the defined types were placed in this encounter.    Procedures: No procedures performed  Clinical Data: No additional findings.  ROS:  All other systems negative, except as noted in the HPI. Review of Systems  Objective: Vital Signs: Ht $RemoveB'5\' 4"'bdYSsrfl$  (1.626 m)   Wt 114 lb (51.7 kg)   BMI 19.57 kg/m   Specialty Comments:  No specialty comments available.  PMFS History: Patient Active Problem List   Diagnosis Date Noted  . Diverticulosis 01/05/2019  . Dermatitis 01/05/2019  . Systolic murmur 00/76/2263  . 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 Hyperglycemia 10/05/2017  . Leg edema 10/05/2017  . 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 Hot flashes due to menopause 10/05/2017  . 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 Hypertension 07/09/2013   Past Medical History:  Diagnosis Date  . Hypertension   . Kidney stones     Family History  Problem Relation Age of Onset  . Diabetes Mother   . Heart attack Mother   . CAD Father   . Heart attack Father   . Breast cancer Neg Hx     Past Surgical History:  Procedure Laterality Date  . BOWEL RESECTION  07/09/2013   Procedure: SMALL BOWEL RESECTION;  Surgeon: Earnstine Regal, MD;  Location: [facility];  Service: General;;  . LAPAROTOMY N/A 07/09/2013   Procedure: EXPLORATORY LAPAROTOMY;  Surgeon: Earnstine Regal, MD;  Location: [facility];  Service: General;  Laterality: N/A;   Social History   Occupational History  . Occupation: Retired   Tobacco Use  . Smoking status: Former Research scientist (life sciences)  . Smokeless tobacco: Never Used  Substance and Sexual Activity  . Alcohol use: Yes    Comment: wine  . Drug use: No  . Sexual activity: Not on file

## 2020-03-02 ENCOUNTER — Other Ambulatory Visit: Payer: Self-pay | Admitting: Family Medicine

## 2020-04-16 ENCOUNTER — Encounter: Payer: Self-pay | Admitting: Gastroenterology

## 2020-04-16 ENCOUNTER — Ambulatory Visit (INDEPENDENT_AMBULATORY_CARE_PROVIDER_SITE_OTHER): Payer: Medicare Other | Admitting: Gastroenterology

## 2020-04-16 VITALS — BP 140/82 | HR 64 | Ht 64.0 in | Wt 115.0 lb

## 2020-04-16 DIAGNOSIS — K6389 Other specified diseases of intestine: Secondary | ICD-10-CM

## 2020-04-16 DIAGNOSIS — R140 Abdominal distension (gaseous): Secondary | ICD-10-CM | POA: Diagnosis not present

## 2020-04-16 DIAGNOSIS — K5792 Diverticulitis of intestine, part unspecified, without perforation or abscess without bleeding: Secondary | ICD-10-CM

## 2020-04-16 DIAGNOSIS — K5750 Diverticulosis of both small and large intestine without perforation or abscess without bleeding: Secondary | ICD-10-CM

## 2020-04-16 DIAGNOSIS — R194 Change in bowel habit: Secondary | ICD-10-CM

## 2020-04-16 MED ORDER — RIFAXIMIN 550 MG PO TABS
550.0000 mg | ORAL_TABLET | Freq: Three times a day (TID) | ORAL | 0 refills | Status: DC
Start: 2020-04-16 — End: 2020-05-06

## 2020-04-16 NOTE — Patient Instructions (Addendum)
I have recommended a CT of the abdomen and pelvis for further evaluation as well as a trial of antibiotics to reset your gut bacteria.   You have been scheduled for a CT scan of the abdomen and pelvis at [facility], 1st floor Radiology. You are scheduled on 04/25/20  at 1:30pm. You should arrive 15 minutes prior to your appointment time for registration.  Please pick up 2 bottles of contrast from [facility] at least 3 days prior to your scan. The solution may taste better if refrigerated, but do NOT add ice or any other liquid to this solution. Shake well before drinking.   Please follow the written instructions below on the day of your exam:   1) Do not eat anything after 9:30am (4 hours prior to your test)   2) Drink 1 bottle of contrast @ 11:30am (2 hours prior to your exam)  Remember to shake well before drinking and do NOT pour over ice.     Drink 1 bottle of contrast @ 12:30pm (1 hour prior to your exam)   You may take any medications as prescribed with a small amount of water, if necessary. If you take any of the following medications: METFORMIN, GLUCOPHAGE, GLUCOVANCE, AVANDAMET, RIOMET, FORTAMET, ACTOPLUS MET, JANUMET, GLUMETZA or METAGLIP, you MAY be asked to HOLD this medication 48 hours AFTER the exam.   The purpose of you drinking the oral contrast is to aid in the visualization of your intestinal tract. The contrast solution may cause some diarrhea. Depending on your individual set of symptoms, you may also receive an intravenous injection of x-ray contrast/dye. Plan on being at [place] for 45 minutes or longer, depending on the type of exam you are having performed.   If you have any questions regarding your exam or if you need to reschedule, you may call [facility] at [phone] between the hours of 8:00 am and 5:00 pm, Monday-Friday.   LABS: Your provider has requested that you go to the basement level for lab work before leaving today. Press "B" on the  elevator. The lab is located at the first door on the left as you exit the elevator.  HEALTHCARE LAWS AND MY CHART RESULTS: Due to recent changes in healthcare laws, you may see the results of your imaging and laboratory studies on MyChart before your provider has had a chance to review them.  We understand that in some cases there may be results that are confusing or concerning to you. Not all laboratory results come back in the same time frame and the provider may be waiting for multiple results in order to interpret others.  Please give us 48 hours in order for your provider to thoroughly review all the results before contacting the office for clarification of your results.   Given your history of diverticulitis I recommend that you  - Follow a high fiber diet or use fiber supplements on a regular basis. - There is no need to avoid seeds, nuts, corn, and berries. - Non-steroidal antiinflammatory medications (such as ibuprofen, naproxen, etc) may increase your risk for a recurrent of diverticulitis. Please avoid these medications whenever possible. I recommend that you talk to Dr. Parker to consider alternative therapies for your shoulder.   PRESCRIPTION MEDICATION(S): We have sent the following medication(s) to your pharmacy:  . Xifaxan 550mg take 1 tablet by mouth three times daily times 14 days. We will obtain a Prior Authorization with your insurance. If denied, we will change   this prescription to a covered alternative. You will receive a call from our office regarding your authorization and changes if needed.  If you are age 81 or older, your body mass index should be between 23-30. Your There is no height or weight on file to calculate BMI. If this is out of the aforementioned range listed, please consider follow up with your Primary Care Provider.  Thank you for trusting me with your gastrointestinal care!    Kimberly Beavers, MD, MPH    

## 2020-04-16 NOTE — Progress Notes (Signed)
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 However, large small bowel diverticula are a frequent source of small intestinal bacterial overgrowth (SIBO). This may explain her ongoing symptoms. Must also consider NSAID-related stricturing disease and anastomotic stricture, which can both result in SIBO.  We discussed EGD and colonoscopy for further evaluation of other GI etiologies. Declines endoscopic evaluation.   PLAN: - Avoid all NSAIDs to prevent recurrent diverticulitis - CT abd/pelvis with contrast to evaluate for complicated diverticulitis - BUN/crt in preparation for CT scan - CBC (last labs available are from 04/2019) - Xifaxan 550 mg TID x 14 days for suspected SIBO. Substitute with doxycyline 100 mg BID x 14 days if Xifaxan is cost prohibitive - Follow-up after the CT scan  Please see the "Patient Instructions" section for addition details about the plan.  HPI: Ellen [name] is a 79 y.o. female referred by Dr. Jerline Pain for abdominal pain. She has a history of hypertension, venous insufficiency, and recurrent episodes of diverticulitis. 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 Imaging has also shown colonic diverticulosis.  She notes intermittent symptoms over the years, particularly with left lower quadrant abdominal pain. Over the last several months she has had occasional loose stools, borborygmous, increased malodorous gas, and bloating.  No blood or mucous. No fevers, chills, or night sweats. Good appetite. Weight fluctuates.    Tums may provide some minimal relief. Has  not tried other therapies. Activa may provide temporary relief, as well.   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   No recent abdominal imaging. Declined empiric antibiotics with Dr. Jerline Pain.  No prior colonoscopy or upper endoscopy. She does not want any endoscopy at her age.    Past Medical History:  Diagnosis Date  . Hypertension   . Kidney stones     Past Surgical History:  Procedure Laterality Date  . BOWEL RESECTION  07/09/2013   Procedure: SMALL BOWEL RESECTION;  Surgeon: Earnstine Regal, MD;  Location: [facility];  Service: General;;  . LAPAROTOMY N/A 07/09/2013   Procedure: EXPLORATORY LAPAROTOMY;  Surgeon: Earnstine Regal, MD;  Location: [facility];  Service: General;  Laterality: N/A;    Current Outpatient Medications  Medication Sig Dispense Refill  . furosemide (LASIX) 20 MG tablet TAKE 1 TABLET(20 MG) BY MOUTH DAILY 30 tablet 3  . hydrochlorothiazide (MICROZIDE) 12.5 MG capsule TAKE 1 CAPSULE(12.5 MG) BY MOUTH DAILY 90 capsule 1  . losartan (COZAAR) 50 MG tablet TAKE 1 TABLET(50 MG) BY MOUTH DAILY 90 tablet 1  . naproxen sodium (ALEVE) 220 MG tablet Aleve 220 mg tablet   1 tablet every day by oral route.    . Omega-3 Fatty Acids (FISH OIL) 500 MG CAPS Take 1 capsule by mouth every other day.    Marland Kitchen PREMPRO 0.45-1.5 MG tablet TAKE 1 TABLET BY MOUTH DAILY 28 tablet 0  . triamcinolone cream (KENALOG) 0.5 % Mix 1:1 with lotion and apply twice daily for 1-2 weeks. 30 g 0   No current facility-administered medications for this visit.    Allergies as of 04/16/2020 - Review Complete 04/16/2020  Allergen Reaction Noted  . Lactose intolerance (gi) Diarrhea 03/10/2014  .  Sulfa antibiotics Nausea And Vomiting and Swelling 07/09/2013    Family History  Problem Relation Age of Onset  . Diabetes Mother   . Heart attack Mother   . CAD Father   . Heart attack Father   . Breast cancer Neg Hx     Social History   Socioeconomic History  . Marital status:  Married    Spouse name: Not on file  . Number of children: Not on file  . Years of education: Not on file  . Highest education level: Not on file  Occupational History  . Occupation: Retired   Tobacco Use  . Smoking status: Former Research scientist (life sciences)  . Smokeless tobacco: Never Used  Substance and Sexual Activity  . Alcohol use: Yes    Comment: wine  . Drug use: No  . Sexual activity: Not on file  Other Topics Concern  . Not on file  Social History Narrative   Enjoys gardening    Social Determinants of Health   Financial Resource Strain:   . Difficulty of Paying Living Expenses: Not on file  Food Insecurity:   . Worried About Charity fundraiser in the Last Year: Not on file  . Ran Out of Food in the Last Year: Not on file  Transportation Needs:   . Lack of Transportation (Medical): Not on file  . Lack of Transportation (Non-Medical): Not on file  Physical Activity:   . Days of Exercise per Week: Not on file  . Minutes of Exercise per Session: Not on file  Stress:   . Feeling of Stress : Not on file  Social Connections:   . Frequency of Communication with Friends and Family: Not on file  . Frequency of Social Gatherings with Friends and Family: Not on file  . Attends Religious Services: Not on file  . Active Member of Clubs or Organizations: Not on file  . Attends Archivist Meetings: Not on file  . Marital Status: Not on file  Intimate Partner Violence:   . Fear of Current or Ex-Partner: Not on file  . Emotionally Abused: Not on file  . Physically Abused: Not on file  . Sexually Abused: Not on file    Review of Systems: 12 system ROS is negative except as noted above.   Physical Exam: General:   Alert,  well-nourished, pleasant and cooperative in NAD Head:  Normocephalic and atraumatic. Eyes:  Sclera clear, no icterus.   Conjunctiva pink. Ears:  Normal auditory acuity. Nose:  No deformity, discharge,  or lesions. Mouth:  No deformity or lesions.   Neck:   Supple; no masses or thyromegaly. Lungs:  Clear throughout to auscultation.   No wheezes. Heart:  Regular rate and rhythm; no murmurs. Abdomen:  Soft, mild left-sided abdominal pain, nondistended, normal bowel sounds, no rebound or guarding. No hepatosplenomegaly.   Rectal:  Deferred  Msk:  Symmetrical. No boney deformities LAD: No inguinal or umbilical LAD Extremities:  No clubbing or edema. Neurologic:  Alert and  oriented x4;  grossly nonfocal Skin:  Intact without significant lesions or rashes. Psych:  Alert and cooperative. Normal mood and affect.     Adrain Butrick L. Tarri Glenn, MD, MPH 04/16/2020, 9:24 AM

## 2020-04-17 ENCOUNTER — Other Ambulatory Visit (INDEPENDENT_AMBULATORY_CARE_PROVIDER_SITE_OTHER): Payer: Medicare Other

## 2020-04-17 DIAGNOSIS — K5792 Diverticulitis of intestine, part unspecified, without perforation or abscess without bleeding: Secondary | ICD-10-CM | POA: Diagnosis not present

## 2020-04-17 DIAGNOSIS — K5750 Diverticulosis of both small and large intestine without perforation or abscess without bleeding: Secondary | ICD-10-CM | POA: Diagnosis not present

## 2020-04-17 DIAGNOSIS — K6389 Other specified diseases of intestine: Secondary | ICD-10-CM | POA: Diagnosis not present

## 2020-04-17 DIAGNOSIS — R140 Abdominal distension (gaseous): Secondary | ICD-10-CM | POA: Diagnosis not present

## 2020-04-17 DIAGNOSIS — R194 Change in bowel habit: Secondary | ICD-10-CM

## 2020-04-17 LAB — CREATININE, SERUM: Creatinine, Ser: 0.86 mg/dL (ref 0.40–1.20)

## 2020-04-17 LAB — BUN: BUN: 25 mg/dL — ABNORMAL HIGH (ref 6–23)

## 2020-04-22 ENCOUNTER — Ambulatory Visit: Payer: Medicare Other

## 2020-04-22 DIAGNOSIS — Z23 Encounter for immunization: Secondary | ICD-10-CM | POA: Diagnosis not present

## 2020-04-25 ENCOUNTER — Telehealth: Payer: Self-pay | Admitting: Gastroenterology

## 2020-04-25 ENCOUNTER — Other Ambulatory Visit: Payer: Self-pay

## 2020-04-25 ENCOUNTER — Ambulatory Visit (HOSPITAL_COMMUNITY)
Admission: RE | Admit: 2020-04-25 | Discharge: 2020-04-25 | Disposition: A | Discharge status: Home / Self Care | Payer: Medicare Other | Source: Ambulatory Visit | Attending: Gastroenterology | Admitting: Gastroenterology

## 2020-04-25 DIAGNOSIS — R194 Change in bowel habit: Secondary | ICD-10-CM | POA: Insufficient documentation

## 2020-04-25 DIAGNOSIS — R140 Abdominal distension (gaseous): Secondary | ICD-10-CM

## 2020-04-25 DIAGNOSIS — K6389 Other specified diseases of intestine: Secondary | ICD-10-CM | POA: Insufficient documentation

## 2020-04-25 DIAGNOSIS — K5750 Diverticulosis of both small and large intestine without perforation or abscess without bleeding: Secondary | ICD-10-CM | POA: Diagnosis not present

## 2020-04-25 DIAGNOSIS — K439 Ventral hernia without obstruction or gangrene: Secondary | ICD-10-CM | POA: Diagnosis not present

## 2020-04-25 DIAGNOSIS — K5730 Diverticulosis of large intestine without perforation or abscess without bleeding: Secondary | ICD-10-CM | POA: Diagnosis not present

## 2020-04-25 DIAGNOSIS — K5792 Diverticulitis of intestine, part unspecified, without perforation or abscess without bleeding: Secondary | ICD-10-CM | POA: Diagnosis not present

## 2020-04-25 DIAGNOSIS — M160 Bilateral primary osteoarthritis of hip: Secondary | ICD-10-CM | POA: Diagnosis not present

## 2020-04-25 MED ORDER — IOHEXOL 300 MG/ML  SOLN: 100.0000 mL | Freq: Once | INTRAMUSCULAR | Status: DC | PRN

## 2020-04-25 NOTE — Telephone Encounter (Signed)
Vonda from radiology called requesting to change the order to w\o contrast patient is currently there. [phone] or [phone]

## 2020-04-25 NOTE — Telephone Encounter (Signed)
Pt at ct, states she breaks out in a rash from the iodine. Pt is refusing to have the ct done with contrast. Radiology requested order without contrast be placed. New order in epic and Dr. Tarri Glenn aware.

## 2020-04-25 NOTE — Telephone Encounter (Signed)
Noted  

## 2020-05-06 ENCOUNTER — Ambulatory Visit (INDEPENDENT_AMBULATORY_CARE_PROVIDER_SITE_OTHER): Payer: Medicare Other | Admitting: Family Medicine

## 2020-05-06 ENCOUNTER — Other Ambulatory Visit: Payer: Self-pay

## 2020-05-06 ENCOUNTER — Encounter: Payer: Self-pay | Admitting: Family Medicine

## 2020-05-06 VITALS — BP 137/69 | HR 65 | Temp 96.8°F | Ht 64.0 in | Wt 114.8 lb

## 2020-05-06 DIAGNOSIS — I10 Essential (primary) hypertension: Secondary | ICD-10-CM | POA: Diagnosis not present

## 2020-05-06 DIAGNOSIS — K5790 Diverticulosis of intestine, part unspecified, without perforation or abscess without bleeding: Secondary | ICD-10-CM | POA: Diagnosis not present

## 2020-05-06 DIAGNOSIS — C4491 Basal cell carcinoma of skin, unspecified: Secondary | ICD-10-CM

## 2020-05-06 NOTE — Assessment & Plan Note (Signed)
Still has issues with intermittent flares.  Had recent CT scan which showed diverticulosis without diverticulitis. will be following up with GI next month to discuss management.  Symptoms are currently stable.

## 2020-05-06 NOTE — Progress Notes (Signed)
   Ellen [name] is a 79 y.o. female who presents today for an office visit.  Assessment/Plan:  New/Acute Problems: Basal Cell Carcinoma Appearance consistent with basal cell carcinoma.  Discussed treatment options.  We will place urgent referral to dermatology.  She will likely need Mohs surgery.  Chronic Problems Addressed Today: Hypertension At goal.  Continue losartan-HCTZ 50-12.5 once daily.   Diverticulosis Still has issues with intermittent flares.  Had recent CT scan which showed diverticulosis without diverticulitis. will be following up with GI next month to discuss management.  Symptoms are currently stable.     Subjective:  HPI:  Patient here with skin lesion for the past 3 to 4 weeks.  Located on left lower cheek.  Has grown in size over that time.  Some pain.  Some pruritus.  Has tried over-the-counter just with improvement.       Objective:  Physical Exam: BP 137/69   Pulse 65   Temp (!) 96.8 F (36 C) (Temporal)   Ht 5\' 4"  (1.626 m)   Wt 114 lb 12.8 oz (52.1 kg)   SpO2 97%   BMI 19.71 kg/m   Wt Readings from Last 3 Encounters:  05/06/20 114 lb 12.8 oz (52.1 kg)  04/16/20 115 lb (52.2 kg)  02/05/20 114 lb (51.7 kg)    Gen: No acute distress, resting comfortably Skin: Left lower cheek with approximately 1 cm in diameter erythematous lesion with rolled borders, central ulceration, and surrounding telangiectasias Neuro: Grossly normal, moves all extremities Psych: Normal affect and thought content      Gertha Lichtenberg M. Jerline Pain, MD 05/06/2020 8:24 AM

## 2020-05-06 NOTE — Assessment & Plan Note (Signed)
At goal.  Continue losartan-HCTZ 50-12.5 once daily.

## 2020-05-06 NOTE — Patient Instructions (Signed)
It was very nice to see you today!  You have a type of skin cancer in your cheek, basal cell carcinoma.  This is typically slow-growing but we need to have it removed.  I will place a referral for you to see a dermatologist to discuss removal.  Take care, Dr Jerline Pain  Please try these tips to maintain a healthy lifestyle:   Eat at least 3 REAL meals and 1-2 snacks per day.  Aim for no more than 5 hours between eating.  If you eat breakfast, please do so within one hour of getting up.    Each meal should contain half fruits/vegetables, one quarter protein, and one quarter carbs (no bigger than a computer mouse)   Cut down on sweet beverages. This includes juice, soda, and sweet tea.     Drink at least 1 glass of water with each meal and aim for at least 8 glasses per day   Exercise at least 150 minutes every week.

## 2020-05-30 ENCOUNTER — Other Ambulatory Visit: Payer: Self-pay

## 2020-05-30 ENCOUNTER — Encounter: Payer: Self-pay | Admitting: Physician Assistant

## 2020-05-30 ENCOUNTER — Ambulatory Visit (INDEPENDENT_AMBULATORY_CARE_PROVIDER_SITE_OTHER): Payer: Medicare Other | Admitting: Physician Assistant

## 2020-05-30 DIAGNOSIS — L570 Actinic keratosis: Secondary | ICD-10-CM | POA: Diagnosis not present

## 2020-05-30 DIAGNOSIS — C44329 Squamous cell carcinoma of skin of other parts of face: Secondary | ICD-10-CM

## 2020-05-30 DIAGNOSIS — C4492 Squamous cell carcinoma of skin, unspecified: Secondary | ICD-10-CM

## 2020-05-30 DIAGNOSIS — L821 Other seborrheic keratosis: Secondary | ICD-10-CM

## 2020-05-30 DIAGNOSIS — D485 Neoplasm of uncertain behavior of skin: Secondary | ICD-10-CM

## 2020-05-30 DIAGNOSIS — C44320 Squamous cell carcinoma of skin of unspecified parts of face: Secondary | ICD-10-CM

## 2020-05-30 DIAGNOSIS — Z1283 Encounter for screening for malignant neoplasm of skin: Secondary | ICD-10-CM | POA: Diagnosis not present

## 2020-05-30 HISTORY — DX: Squamous cell carcinoma of skin, unspecified: C44.92

## 2020-05-30 NOTE — Patient Instructions (Addendum)
Biopsy, Surgery (Curettage) & Surgery (Excision) Aftercare Instructions  1. Okay to remove bandage in 24 hours  2. Wash area with soap and water  3. Apply Vaseline to area twice daily until healed (Not Neosporin)  4. Okay to cover with a Band-Aid to decrease the chance of infection or prevent irritation from clothing; also it's okay to uncover lesion at home.  5. Suture instructions: return to our office in 7-10 or 10-14 days for a nurse visit for suture removal. Variable healing with sutures, if pain or itching occurs call our office. It's okay to shower or bathe 24 hours after sutures are given.  6. The following risks may occur after a biopsy, curettage or excision: bleeding, scarring, discoloration, recurrence, infection (redness, yellow drainage, pain or swelling).  7. For questions, concerns and results call our office at Monday-Thursday before 4pm & Friday before 3pm. Biopsy results will be available in 1 week.    Mohs Surgery Mohs surgery is a procedure used to treat skin cancer. It is often used to treat common types of skin cancer, such as basal cell carcinoma and squamous cell carcinoma. In this procedure, cancerous skin cells are carefully cut away layer by layer. The goal is to remove all cancerous tissue and leave healthy skin. This reduces scarring and allows for a better cosmetic outcome. Mohs surgery is used to treat skin cancer in areas where it is important to save as much of the normal skin as possible. These areas include the face, nose, ears, lips, and genitals. This procedure may be done if:  Your skin cancer has returned after another type of treatment was done.  The cancer is likely to return.  The cancerous area is large.  The cancerous area has edges that are not clearly defined.  The cancer is growing rapidly. Tell a health care provider about:  Any allergies you have.  All medicines you are taking, including vitamins, herbs, eye drops, creams, and  over-the-counter medicines.  Any problems you or family members have had with anesthetic medicines.  Any blood disorders you have.  Any surgeries you have had.  Any medical conditions you have.  Whether you are pregnant or may be pregnant. What are the risks? Generally, this is a safe procedure. However, problems may occur, including:  Infection.  Bleeding.  Allergic reactions to medicines.  Damage to other structures, such as nerves. What happens before the procedure?  Ask your health care provider about: ? Changing or stopping your regular medicines. This is especially important if you are taking diabetes medicines or blood thinners. ? Taking medicines such as aspirin and ibuprofen. These medicines can thin your blood. Do not take these medicines unless your health care provider tells you to take them. ? Taking over-the-counter medicines, vitamins, herbs, and supplements.  Ask your health care provider how your surgical site will be marked or identified.  Ask your health care provider what steps will be taken to help prevent infection. These may include: ? Removing hair at the surgery site. ? Washing skin with a germ-killing soap. ? Taking antibiotic medicine. What happens during the procedure?  You will be given a medicine to numb the area (local anesthetic).  A layer of cancerous tissue will be removed with a scalpel. The layer removed will contain a small amount of the healthy tissue surrounding the cancerous tissue.  The layer of removed tissue will be checked right away under a microscope. The surgeon will note the exact location of the cancerous cells.    Another layer of tissue may be removed from an area with any remaining cancerous cells. This layer will be checked in the same way.  More layers of cancerous tissue may be removed, one by one, and checked until no signs of cancer remain.  Depending on the size and location of the surgical wound, it may be closed  with stitches (sutures) or left open to heal on its own. In some cases, a skin flap or skin graft may be needed.  A bandage (dressing) will be applied to the area. The procedure may vary among health care providers and hospitals.   What happens after the procedure?  Return to your normal activities as told by your health care provider. Summary  Mohs surgery is a procedure used to treat skin cancer on the face, ears, nose, lips, and genitals. It removes the cancerous cells while leaving as much healthy skin as possible.  Generally, this is a safe procedure. However, problems may occur, including infection, bleeding, and damage to other structures, such as nerves.  Follow your health care provider's instructions before the procedure. You may be asked to change or stop certain medicines.  After the procedure, you may return to your normal activities as told by your health care provider. This information is not intended to replace advice given to you by your health care provider. Make sure you discuss any questions you have with your health care provider. Document Revised: 11/30/2017 Document Reviewed: 11/30/2017 Elsevier Patient Education  [place].

## 2020-05-30 NOTE — Progress Notes (Signed)
Dr Jerline Pain right thigh years ago dug out a spot

## 2020-06-04 ENCOUNTER — Encounter: Payer: Self-pay | Admitting: Physician Assistant

## 2020-06-04 NOTE — Progress Notes (Signed)
   New Patient   Subjective  Ellen [name] is a 80 y.o. female who presents for the following: Skin Problem (Left cheek nonhealing lesion x months).   The following portions of the chart were reviewed this encounter and updated as appropriate:      Objective  Well appearing patient in no apparent distress; mood and affect are within normal limits.  All skin waist up examined.  Objective  waist up and legs: Waist up skin examination  Objective  scattered trunk: Stuck-on, waxy, tan-brown papules and plaques. --Discussed benign etiology and prognosis.   Objective  Left Elbow - Posterior (3), Neck - Posterior (3): Erythematous patches with gritty scale.  Objective  Left Buccal Cheek: Pink pearly papule      Assessment & Plan  Screening exam for skin cancer waist up and legs  Yearly skin check  Seborrheic keratosis scattered trunk  Okay to leave if stable  AK (actinic keratosis) (6) Left Elbow - Posterior (3); Neck - Posterior (3)  Destruction of lesion - Left Elbow - Posterior, Neck - Posterior Complexity: simple   Destruction method: cryotherapy   Informed consent: discussed and consent obtained   Timeout:  patient name, date of birth, surgical site, and procedure verified Lesion destroyed using liquid nitrogen: Yes   Cryotherapy cycles:  3 Outcome: patient tolerated procedure well with no complications    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  Skin / nail biopsy Type of biopsy: tangential   Informed consent: discussed and consent obtained   Timeout: patient name, date of birth, surgical site, and procedure verified   Procedure prep:  Patient was prepped and draped in usual sterile fashion (Non sterile) Prep type:  Chlorhexidine Anesthesia: the lesion was anesthetized in a standard fashion   Anesthetic:  1% lidocaine w/ epinephrine 1-100,000 local infiltration Instrument used: flexible razor blade   Outcome: patient  tolerated procedure well   Post-procedure details: wound care instructions given    Specimen 1 - Surgical pathology Differential Diagnosis: bcc vs scc  Check Margins: No  Removed a keratin core. Mohs if positive for BCC     I, Ovid Witman, PA-C, have reviewed all documentation for this visit. The documentation on 06/04/20 for the exam, diagnosis, procedures, and orders are all accurate and complete.

## 2020-06-05 ENCOUNTER — Telehealth: Payer: Self-pay

## 2020-06-05 NOTE — Telephone Encounter (Signed)
-----   Message from Warren Danes, [place] sent at 06/04/2020  4:02 PM EST ----- Mohs please

## 2020-06-05 NOTE — Telephone Encounter (Signed)
Phone call to patient with her pathology results and [facility] recommendations.  Patient aware, info sent over for [facility].

## 2020-06-05 NOTE — Telephone Encounter (Signed)
-----   Message from Kelli R Sheffield, PA-C sent at 06/04/2020  4:02 PM EST ----- Mohs please 

## 2020-06-06 ENCOUNTER — Encounter: Payer: Self-pay | Admitting: Gastroenterology

## 2020-06-06 ENCOUNTER — Ambulatory Visit (INDEPENDENT_AMBULATORY_CARE_PROVIDER_SITE_OTHER): Payer: Medicare Other | Admitting: Gastroenterology

## 2020-06-06 VITALS — BP 118/72 | HR 61 | Ht 64.0 in | Wt 114.2 lb

## 2020-06-06 DIAGNOSIS — R194 Change in bowel habit: Secondary | ICD-10-CM | POA: Diagnosis not present

## 2020-06-06 DIAGNOSIS — K6389 Other specified diseases of intestine: Secondary | ICD-10-CM

## 2020-06-06 DIAGNOSIS — R140 Abdominal distension (gaseous): Secondary | ICD-10-CM

## 2020-06-06 DIAGNOSIS — K5750 Diverticulosis of both small and large intestine without perforation or abscess without bleeding: Secondary | ICD-10-CM

## 2020-06-06 MED ORDER — RIFAXIMIN 550 MG PO TABS: 550.0000 mg | ORAL_TABLET | Freq: Three times a day (TID) | ORAL | 0 refills | Status: AC

## 2020-06-06 NOTE — Progress Notes (Signed)
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  Symptoms may be due to intermittent symptomatic diverticulosis with possible SIBO overgrowth.  Must also consider NSAID-related stricturing disease and anastomotic stricture, which can both result in SIBO.   PLAN: - Avoid all NSAIDs to prevent recurrent diverticulitis - Add a daily dose of Metamucil, Benefiber or Citrucel increasing to twice daily dosing schedule if no improvement noted - Xifaxan 550 mg TID x 14 days for suspected SIBO. Substitute with doxycyline 100 mg BID x 14 days if Xifaxan is cost prohibitive - Follow-up in 3 to 6 months, earlier if needed  Please see the "Patient Instructions" section for addition details about the plan.  HPI: Ellen [name] is a 80 y.o. female who returns in schedule follow-up for abdominal pain after her initial consultation 04/16/2020. She has a history of hypertension, venous insufficiency, and recurrent episodes of diverticulitis. 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 Imaging has also shown colonic diverticulosis.  She has had intermittent symptoms over the years, particularly with left lower quadrant abdominal pain. Over the last several months she has had occasional loose stools, borborygmous, increased malodorous gas, and bloating.  No blood or mucous. No fevers, chills, or night sweats. Good appetite. Weight fluctuates.    Tums and Activa may provide some minimal relief. Has not tried other therapies. 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   No prior colonoscopy or upper endoscopy.  She declines any endoscopic evaluation given her age.  CT of the abdomen pelvis 04/25/2020 showed no acute findings.  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  She has a small ventral hernia containing only mesenteric fat.  She returns today without any episodes of severe abdominal pain since her initial consultation.  She continues to have gas, bloating, and intermittent loose stools.  There are no new alarm features.  Past Medical History:  Diagnosis Date  . Hypertension   . Kidney stones   . 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   Left Buccal Cheek (Keratoacanthoma)    Past Surgical History:  Procedure Laterality Date  . BOWEL RESECTION  07/09/2013   Procedure: SMALL BOWEL RESECTION;  Surgeon: Earnstine Regal, MD;  Location: [facility];  Service: General;;  . LAPAROTOMY N/A 07/09/2013   Procedure: EXPLORATORY LAPAROTOMY;  Surgeon: Earnstine Regal, MD;  Location: [facility];  Service: General;  Laterality: N/A;    Current Outpatient Medications  Medication Sig Dispense Refill  . furosemide (LASIX) 20 MG tablet TAKE 1 TABLET(20 MG) BY MOUTH DAILY 30 tablet 3  . hydrochlorothiazide (MICROZIDE) 12.5 MG capsule TAKE 1 CAPSULE(12.5 MG) BY MOUTH DAILY 90 capsule 1  . losartan (COZAAR) 50 MG tablet TAKE 1 TABLET(50 MG) BY MOUTH DAILY 90 tablet 1  . naproxen sodium (ALEVE) 220 MG tablet Aleve 220 mg tablet   1 tablet every day by oral route.    . Omega-3 Fatty Acids (FISH OIL) 500 MG CAPS Take 1 capsule by mouth every other day.    Marland Kitchen PREMPRO 0.45-1.5 MG tablet TAKE  1 TABLET BY MOUTH DAILY 28 tablet 0  . triamcinolone cream (KENALOG) 0.5 % Mix 1:1 with lotion and apply twice daily for 1-2 weeks. 30 g 0   No current facility-administered medications for this visit.    Allergies as of 06/06/2020 - Review Complete  06/04/2020  Allergen Reaction Noted  . Lactose intolerance (gi) Diarrhea 03/10/2014  . Sulfa antibiotics Nausea And Vomiting and Swelling 07/09/2013    Family History  Problem Relation Age of Onset  . Diabetes Mother   . Heart attack Mother   . CAD Father   . Heart attack Father   . Breast cancer Neg Hx     Social History   Socioeconomic History  . Marital status: Married    Spouse name: Not on file  . Number of children: Not on file  . Years of education: Not on file  . Highest education level: Not on file  Occupational History  . Occupation: Retired   Tobacco Use  . Smoking status: Former Research scientist (life sciences)  . Smokeless tobacco: Never Used  Vaping Use  . Vaping Use: Never used  Substance and Sexual Activity  . Alcohol use: Yes    Comment: wine  . Drug use: No  . Sexual activity: Not on file  Other Topics Concern  . Not on file  Social History Narrative   Enjoys gardening    Social Determinants of Health   Financial Resource Strain: Not on file  Food Insecurity: Not on file  Transportation Needs: Not on file  Physical Activity: Not on file  Stress: Not on file  Social Connections: Not on file  Intimate Partner Violence: Not on file     Physical Exam: General:   Alert,  well-nourished, pleasant and cooperative in NAD Head:  Normocephalic and atraumatic. Eyes:  Sclera clear, no icterus.   Conjunctiva pink. Abdomen:  Soft, mild left-sided abdominal pain has resolved, nondistended, normal bowel sounds, no rebound or guarding. No hepatosplenomegaly.   Neurologic:  Alert and  oriented x4;  grossly nonfocal Skin:  Intact without significant lesions or rashes. Psych:  Alert and cooperative. Normal mood and affect.     Santosha Jividen L. Tarri Glenn, MD, MPH 06/06/2020, 8:44 AM

## 2020-06-06 NOTE — Patient Instructions (Addendum)
I recommend that you add a daily stool bulking agent such as Metamucil, Benefiber, or Citrucel.  I would start using this once daily.  You could increase it to twice daily if you do not notice any change in your bowel habits.  We will try 14 days of antibiotics for possible bacterial overgrowth. This occurs frequently in the setting of diverticulosis and my hope is this might improve your gas, bloating, and loose stools.  PRESCRIPTION MEDICATION(S): We have sent the following medication(s) to your pharmacy:  . Doreene Nest - please take 550mg  by mouth x 14 days  . NOTE: If your medication(s) requires a PRIOR AUTHORIZATION, we will receive notification from your pharmacy. Once received, the process to submit for approval may take up to 7-10 business days. You will be contacted about any denials we have received from your insurance company as well as alternatives recommended by your provider.  If you are age 82 or older, your body mass index should be between 23-30. Your Body mass index is 19.6 kg/m. If this is out of the aforementioned range listed, please consider follow up with your Primary Care Provider.  I like to see you back in the office in 3 to 6 months or earlier if you are having any additional problems. Please call the office at [phone] to schedule your appointment.  Thank you for trusting me with your gastrointestinal care!    Thornton Park, MD, MPH

## 2020-06-08 ENCOUNTER — Other Ambulatory Visit: Payer: Self-pay | Admitting: Family Medicine

## 2020-06-17 ENCOUNTER — Telehealth: Payer: Self-pay | Admitting: Family Medicine

## 2020-06-17 NOTE — Telephone Encounter (Signed)
Left message for patient to call back and schedule Medicare Annual Wellness Visit (AWV) either virtually OR in office.   Last AWV 05/15/19; please schedule at anytime with [facility]-Nurse Health Advisor at [facility].  This should be a 45 minute visit 

## 2020-06-19 ENCOUNTER — Telehealth: Payer: Self-pay | Admitting: Gastroenterology

## 2020-06-19 NOTE — Telephone Encounter (Signed)
Pt called to let you know that she spoke with her pharmacy and was inform that antibiotic is over $2,000. Pls call her.

## 2020-06-19 NOTE — Telephone Encounter (Signed)
Either samples of Xifaxan 550 mg TID x 14 days or substitute with doxycyline 100 mg BID x 14 days if Xifaxan is cost prohibitive

## 2020-06-19 NOTE — Telephone Encounter (Signed)
Outpatient Medication Detail   Disp Refills Start End   rifaximin (XIFAXAN) 550 MG TABS tablet 42 tablet 0 06/06/2020 06/20/2020   Sig - Route: Take 1 tablet (550 mg total) by mouth 3 (three) times daily for 14 days. - Oral   Sent to pharmacy as: rifaximin (XIFAXAN) 550 MG Tab tablet   E-Prescribing Status: Receipt confirmed by pharmacy (06/06/2020  9:28 AM EST)    Pharmacy  [facility], [place] - [address] AT [address]  [place], [place] [address]  Phone:  [phone]  Fax:  [phone]    Called above pharmacy to verify they had received Rx has indicated above. Spoke with pharmacist who confirmed Rx had been received but on hold because pt has not returned their call re: Rx coverage.   Immediately called pt and advised of my above conversation. Advised she call pharmacy as they had requested. Asked if she had Rx coverage. Pt did state she does not. Advised this is very helpful information for [place] to have in the future when refilling or prescribing medications. Advised I will need to speak with Dr. Tarri Glenn to determine if she wants to order an affordable alternative or provide sample, IF AVAILABLE TO PROVIDE.

## 2020-06-19 NOTE — Telephone Encounter (Signed)
Called pt and advised that samples of Xifaxan are available at our office, front desk, for her to pick up at her convenience. Verbalized acceptance and understanding.

## 2020-06-19 NOTE — Telephone Encounter (Signed)
Inbound call from patient stating pharmacy informed her they never received rifaximin script.  Please advise.

## 2020-06-25 ENCOUNTER — Encounter: Payer: Self-pay | Admitting: Family Medicine

## 2020-06-25 DIAGNOSIS — C44329 Squamous cell carcinoma of skin of other parts of face: Secondary | ICD-10-CM | POA: Diagnosis not present

## 2020-06-27 ENCOUNTER — Ambulatory Visit (INDEPENDENT_AMBULATORY_CARE_PROVIDER_SITE_OTHER): Payer: Medicare Other

## 2020-06-27 ENCOUNTER — Ambulatory Visit: Payer: Medicare Other

## 2020-06-27 ENCOUNTER — Other Ambulatory Visit: Payer: Self-pay

## 2020-06-27 VITALS — BP 138/78 | HR 95 | Temp 97.7°F | Resp 20 | Wt 114.4 lb

## 2020-06-27 DIAGNOSIS — Z0000 Encounter for general adult medical examination without abnormal findings: Secondary | ICD-10-CM

## 2020-06-27 DIAGNOSIS — Z23 Encounter for immunization: Secondary | ICD-10-CM | POA: Diagnosis not present

## 2020-06-27 NOTE — Patient Instructions (Addendum)
Ellen [name] , Thank you for taking time to come for your Medicare Wellness Visit. I appreciate your ongoing commitment to your health goals. Please review the following plan we discussed and let me know if I can assist you in the future.   Screening recommendations/referrals: Colonoscopy: No longer required Mammogram: Done 08/08/19 Bone Density: Done 08/08/19 Recommended yearly ophthalmology/optometry visit for glaucoma screening and checkup Recommended yearly dental visit for hygiene and checkup  Vaccinations: Influenza vaccine: Done 06/27/20 Pneumococcal vaccine: Up to date Tdap vaccine: Due and discussed  Shingles vaccine: Shingrix discussed. Please contact your pharmacy for coverage information.    Covid-19:Completyed 3/12, 4/2, & 04/22/20  Advanced directives: Please bring a copy of your health care power of attorney and living will to the office at your convenience.  Conditions/risks identified: Stay active  Next appointment: Follow up in one year for your annual wellness visit    Preventive Care 65 Years and Older, Female Preventive care refers to lifestyle choices and visits with your health care provider that can promote health and wellness. What does preventive care include?  A yearly physical exam. This is also called an annual well check.  Dental exams once or twice a year.  Routine eye exams. Ask your health care provider how often you should have your eyes checked.  Personal lifestyle choices, including:  Daily care of your teeth and gums.  Regular physical activity.  Eating a healthy diet.  Avoiding tobacco and drug use.  Limiting alcohol use.  Practicing safe sex.  Taking low-dose aspirin every day.  Taking vitamin and mineral supplements as recommended by your health care provider. What happens during an annual well check? The services and screenings done by your health care provider during your annual well check will depend on your age, overall  health, lifestyle risk factors, and family history of disease. Counseling  Your health care provider may ask you questions about your:  Alcohol use.  Tobacco use.  Drug use.  Emotional well-being.  Home and relationship well-being.  Sexual activity.  Eating habits.  History of falls.  Memory and ability to understand (cognition).  Work and work Statistician.  Reproductive health. Screening  You may have the following tests or measurements:  Height, weight, and BMI.  Blood pressure.  Lipid and cholesterol levels. These may be checked every 5 years, or more frequently if you are over 51 years old.  Skin check.  Lung cancer screening. You may have this screening every year starting at age 61 if you have a 30-pack-year history of smoking and currently smoke or have quit within the past 15 years.  Fecal occult blood test (FOBT) of the stool. You may have this test every year starting at age 74.  Flexible sigmoidoscopy or colonoscopy. You may have a sigmoidoscopy every 5 years or a colonoscopy every 10 years starting at age 93.  Hepatitis C blood test.  Hepatitis B blood test.  Sexually transmitted disease (STD) testing.  Diabetes screening. This is done by checking your blood sugar (glucose) after you have not eaten for a while (fasting). You may have this done every 1-3 years.  Bone density scan. This is done to screen for osteoporosis. You may have this done starting at age 41.  Mammogram. This may be done every 1-2 years. Talk to your health care provider about how often you should have regular mammograms. Talk with your health care provider about your test results, treatment options, and if necessary, the need for more tests. Vaccines  Your health care provider may recommend certain vaccines, such as:  Influenza vaccine. This is recommended every year.  Tetanus, diphtheria, and acellular pertussis (Tdap, Td) vaccine. You may need a Td booster every 10  years.  Zoster vaccine. You may need this after age 65.  Pneumococcal 13-valent conjugate (PCV13) vaccine. One dose is recommended after age 14.  Pneumococcal polysaccharide (PPSV23) vaccine. One dose is recommended after age 45. Talk to your health care provider about which screenings and vaccines you need and how often you need them. This information is not intended to replace advice given to you by your health care provider. Make sure you discuss any questions you have with your health care provider. Document Released: 05/31/2015 Document Revised: 01/22/2016 Document Reviewed: 03/05/2015 Elsevier Interactive Patient Education  2017 [place] Prevention in the Home Falls can cause injuries. They can happen to people of all ages. There are many things you can do to make your home safe and to help prevent falls. What can I do on the outside of my home?  Regularly fix the edges of walkways and driveways and fix any cracks.  Remove anything that might make you trip as you walk through a door, such as a raised step or threshold.  Trim any bushes or trees on the path to your home.  Use bright outdoor lighting.  Clear any walking paths of anything that might make someone trip, such as rocks or tools.  Regularly check to see if handrails are loose or broken. Make sure that both sides of any steps have handrails.  Any raised decks and porches should have guardrails on the edges.  Have any leaves, snow, or ice cleared regularly.  Use sand or salt on walking paths during winter.  Clean up any spills in your garage right away. This includes oil or grease spills. What can I do in the bathroom?  Use night lights.  Install grab bars by the toilet and in the tub and shower. Do not use towel bars as grab bars.  Use non-skid mats or decals in the tub or shower.  If you need to sit down in the shower, use a plastic, non-slip stool.  Keep the floor dry. Clean up any water that  spills on the floor as soon as it happens.  Remove soap buildup in the tub or shower regularly.  Attach bath mats securely with double-sided non-slip rug tape.  Do not have throw rugs and other things on the floor that can make you trip. What can I do in the bedroom?  Use night lights.  Make sure that you have a light by your bed that is easy to reach.  Do not use any sheets or blankets that are too big for your bed. They should not hang down onto the floor.  Have a firm chair that has side arms. You can use this for support while you get dressed.  Do not have throw rugs and other things on the floor that can make you trip. What can I do in the kitchen?  Clean up any spills right away.  Avoid walking on wet floors.  Keep items that you use a lot in easy-to-reach places.  If you need to reach something above you, use a strong step stool that has a grab bar.  Keep electrical cords out of the way.  Do not use floor polish or wax that makes floors slippery. If you must use wax, use non-skid floor wax.  Do  not have throw rugs and other things on the floor that can make you trip. What can I do with my stairs?  Do not leave any items on the stairs.  Make sure that there are handrails on both sides of the stairs and use them. Fix handrails that are broken or loose. Make sure that handrails are as long as the stairways.  Check any carpeting to make sure that it is firmly attached to the stairs. Fix any carpet that is loose or worn.  Avoid having throw rugs at the top or bottom of the stairs. If you do have throw rugs, attach them to the floor with carpet tape.  Make sure that you have a light switch at the top of the stairs and the bottom of the stairs. If you do not have them, ask someone to add them for you. What else can I do to help prevent falls?  Wear shoes that:  Do not have high heels.  Have rubber bottoms.  Are comfortable and fit you well.  Are closed at the  toe. Do not wear sandals.  If you use a stepladder:  Make sure that it is fully opened. Do not climb a closed stepladder.  Make sure that both sides of the stepladder are locked into place.  Ask someone to hold it for you, if possible.  Clearly mark and make sure that you can see:  Any grab bars or handrails.  First and last steps.  Where the edge of each step is.  Use tools that help you move around (mobility aids) if they are needed. These include:  Canes.  Walkers.  Scooters.  Crutches.  Turn on the lights when you go into a dark area. Replace any light bulbs as soon as they burn out.  Set up your furniture so you have a clear path. Avoid moving your furniture around.  If any of your floors are uneven, fix them.  If there are any pets around you, be aware of where they are.  Review your medicines with your doctor. Some medicines can make you feel dizzy. This can increase your chance of falling. Ask your doctor what other things that you can do to help prevent falls. This information is not intended to replace advice given to you by your health care provider. Make sure you discuss any questions you have with your health care provider. Document Released: 02/28/2009 Document Revised: 10/10/2015 Document Reviewed: 06/08/2014 Elsevier Interactive Patient Education  2017 [employer].

## 2020-06-27 NOTE — Progress Notes (Signed)
Subjective:   Ellen [name] is a 80 y.o. female who presents for Medicare Annual (Subsequent) preventive examination.  Review of Systems     Cardiac Risk Factors include: advanced age (>78men, >8 women);hypertension     Objective:    Today's Vitals   06/27/20 1515  BP: 138/78  Pulse: 95  Resp: 20  Temp: 97.7 F (36.5 C)  SpO2: 95%  Weight: 114 lb 6.4 oz (51.9 kg)  PainSc: 1    Body mass index is 19.64 kg/m.  Advanced Directives 06/27/2020 05/15/2019 02/22/2019 01/10/2015 03/10/2014 07/09/2013  Does Patient Have a Medical Advance Directive? Yes Yes Yes No No Patient does not have advance directive;Patient would not like information  Type of Advance Directive Healthcare Power of Attorney Living will;Healthcare Power of [place];Living will - - -  Does patient want to make changes to medical advance directive? - No - Patient declined - - - -  Copy of [place] in Chart? No - copy requested No - copy requested Yes - validated most recent copy scanned in chart (See row information) - - -  Would patient like information on creating a medical advance directive? - - - No - patient declined information Yes - Educational materials given -  Pre-existing out of facility DNR order (yellow form or pink MOST form) - - - - - No    Current Medications (verified) Outpatient Encounter Medications as of 06/27/2020  Medication Sig  . furosemide (LASIX) 20 MG tablet TAKE 1 TABLET(20 MG) BY MOUTH DAILY  . hydrochlorothiazide (MICROZIDE) 12.5 MG capsule TAKE 1 CAPSULE(12.5 MG) BY MOUTH DAILY  . losartan (COZAAR) 50 MG tablet TAKE 1 TABLET(50 MG) BY MOUTH DAILY  . naproxen sodium (ALEVE) 220 MG tablet Aleve 220 mg tablet   1 tablet every day by oral route.  Marland Kitchen PREMPRO 0.45-1.5 MG tablet TAKE 1 TABLET BY MOUTH DAILY  . rifaximin (XIFAXAN) 550 MG TABS tablet Take 550 mg by mouth 3 (three) times daily. Only for 15 days  . triamcinolone cream  (KENALOG) 0.5 % Mix 1:1 with lotion and apply twice daily for 1-2 weeks.  . [DISCONTINUED] Omega-3 Fatty Acids (FISH OIL) 500 MG CAPS Take 1 capsule by mouth every other day. (Patient not taking: Reported on 06/27/2020)   No facility-administered encounter medications on file as of 06/27/2020.    Allergies (verified) Lactose intolerance (gi) and Sulfa antibiotics   History: Past Medical History:  Diagnosis Date  . Hypertension   . Kidney stones   . 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 BOWEL RESECTION  07/09/2013   Procedure: SMALL BOWEL RESECTION;  Surgeon: Earnstine Regal, MD;  Location: [facility];  Service: General;;  . LAPAROTOMY N/A 07/09/2013   Procedure: EXPLORATORY LAPAROTOMY;  Surgeon: Earnstine Regal, MD;  Location: [facility];  Service: General;  Laterality: N/A;   Family History  Problem Relation Age of Onset  . Diabetes Mother   . Heart attack Mother   . CAD Father   . Heart attack Father   . Breast cancer Neg Hx    Social History   Socioeconomic History  . Marital status: Married    Spouse name: Not on file  . Number of children: Not on file  . Years of education: Not on file  . Highest education level: Not on file  Occupational History  . Occupation:  Retired   Tobacco Use  . Smoking status: Former Smoker    Types: Cigarettes  . Smokeless tobacco: Never Used  Vaping Use  . Vaping Use: Never used  Substance and Sexual Activity  . Alcohol use: Yes    Alcohol/week: 7.0 standard drinks    Types: 7 Glasses of wine per week  . Drug use: No  . Sexual activity: Not Currently  Other Topics Concern  . Not on file  Social History Narrative   Enjoys gardening    Social Determinants of Health   Financial Resource Strain: Low Risk   . Difficulty of Paying Living Expenses: Not hard at all  Food Insecurity: No Food Insecurity  . Worried About Charity fundraiser in  the Last Year: Never true  . Ran Out of Food in the Last Year: Never true  Transportation Needs: No Transportation Needs  . Lack of Transportation (Medical): No  . Lack of Transportation (Non-Medical): No  Physical Activity: Sufficiently Active  . Days of Exercise per Week: 7 days  . Minutes of Exercise per Session: 120 min  Stress: No Stress Concern Present  . Feeling of Stress : Not at all  Social Connections: Moderately Isolated  . Frequency of Communication with Friends and Family: Never  . Frequency of Social Gatherings with Friends and Family: Once a week  . Attends Religious Services: More than 4 times per year  . Active Member of Clubs or Organizations: No  . Attends Archivist Meetings: Never  . Marital Status: Married    Tobacco Counseling Counseling given: Not Answered   Clinical Intake:  Pre-visit preparation completed: Yes  Pain : 0-10 (left side of face) Pain Score: 1  Pain Type: Acute pain Pain Location: Face (from surgery) Pain Orientation: Left Pain Descriptors / Indicators: Burning,Sore Pain Onset: In the past 7 days     BMI - recorded: 19.64 Nutritional Status: BMI of 19-24  Normal Nutritional Risks: Nausea/ vomitting/ diarrhea (loose stools at times) Diabetes: No  How often do you need to have someone help you when you read instructions, pamphlets, or other written materials from your doctor or pharmacy?: 1 - Never  Diabetic?no  Interpreter Needed?: No  Information entered by :: Charlott Rakes, LPN   Activities of Daily Living In your present state of health, do you have any difficulty performing the following activities: 06/27/2020  Hearing? Y  Comment mild loss  Vision? N  Difficulty concentrating or making decisions? N  Walking or climbing stairs? N  Dressing or bathing? N  Doing errands, shopping? N  Preparing Food and eating ? N  Using the Toilet? N  In the past six months, have you accidently leaked urine? N  Do you  have problems with loss of bowel control? Y  Comment when diverticulitis flares up  Managing your Medications? N  Managing your Finances? N  Housekeeping or managing your Housekeeping? N  Some recent data might be hidden    Patient Care Team: Vivi Barrack, MD as PCP - General (Family Medicine) Warren Danes, PA-C as Physician Assistant (Dermatology)  Indicate any recent Medical Services you may have received from other than [facility] providers in the past year (date may be approximate).     Assessment:   This is a routine wellness examination for Ellen [name].  Hearing/Vision screen  Hearing Screening   125Hz  250Hz  500Hz  1000Hz  2000Hz  3000Hz  4000Hz  6000Hz  8000Hz   Right ear:  Left ear:           Comments: Pt states mild loss   Vision Screening Comments: Pt follows up with [facility] for annual eye exams  Dietary issues and exercise activities discussed: Current Exercise Habits: Home exercise routine, Time (Minutes): > 60, Frequency (Times/Week): 7, Weekly Exercise (Minutes/Week): 0  Goals    . Patient Stated     Staying active      Depression Screen PHQ 2/9 Scores 06/27/2020 05/15/2019 07/25/2018 10/05/2017  PHQ - 2 Score 0 0 0 0    Fall Risk Fall Risk  06/27/2020 05/15/2019 10/05/2017  Falls in the past year? 0 0 No  Number falls in past yr: 0 - -  Injury with Fall? 0 0 -  Risk for fall due to : Impaired vision - -  Follow up Falls prevention discussed Falls evaluation completed;Education provided;Falls prevention discussed -    FALL RISK PREVENTION PERTAINING TO THE HOME:  Any stairs in or around the home? Yes  If so, are there any without handrails? No  Home free of loose throw rugs in walkways, pet beds, electrical cords, etc? Yes  Adequate lighting in your home to reduce risk of falls? Yes   ASSISTIVE DEVICES UTILIZED TO PREVENT FALLS:  Life alert? Yes  Use of a cane, walker or w/c? No  Grab bars in the bathroom? Yes  Shower chair or bench in  shower? Yes  Elevated toilet seat or a handicapped toilet? Yes   TIMED UP AND GO:  Was the test performed? Yes .  Length of time to ambulate 10 feet:  sec.   Gait steady and fast without use of assistive device  Cognitive Function:     6CIT Screen 06/27/2020  What Year? 0 points  What month? 0 points  Count back from 20 0 points  Months in reverse 0 points  Repeat phrase 0 points    Immunizations Immunization History  Administered Date(s) Administered  . Fluad Quad(high Dose 65+) 02/17/2019, 06/27/2020  . Influenza, High Dose Seasonal PF 07/25/2018  . PFIZER(Purple Top)SARS-COV-2 Vaccination 07/28/2019, 08/18/2019, 04/22/2020    TDAP status: Due, Education has been provided regarding the importance of this vaccine. Advised may receive this vaccine at local pharmacy or Health Dept. Aware to provide a copy of the vaccination record if obtained from local pharmacy or Health Dept. Verbalized acceptance and understanding.  Flu Vaccine status: Completed at today's visit  Pneumococcal vaccine status: Up to date  Covid-19 vaccine status: Completed vaccines  Qualifies for Shingles Vaccine? Yes   Zostavax completed No   Shingrix Completed?: No.    Education has been provided regarding the importance of this vaccine. Patient has been advised to call insurance company to determine out of pocket expense if they have not yet received this vaccine. Advised may also receive vaccine at local pharmacy or Health Dept. Verbalized acceptance and understanding.  Screening Tests Health Maintenance  Topic Date Due  . TETANUS/TDAP  05/06/2021 (Originally 03/11/1960)  . PNA vac Low Risk Adult (1 of 2 - PCV13) 05/06/2021 (Originally 03/11/2004)  . MAMMOGRAM  08/07/2020  . COVID-19 Vaccine (4 - Booster for Pfizer series) 10/21/2020  . INFLUENZA VACCINE  Completed  . DEXA SCAN  Completed    Health Maintenance  There are no preventive care reminders to display for this patient.  Colorectal  cancer screening: No longer required.   Mammogram status: Completed 08/08/19. Repeat every year  Bone Density status: Completed 08/08/19. Results reflect: Bone density results:  NORMAL. Repeat every 2 years.   Additional Screening:  Vision Screening: Recommended annual ophthalmology exams for early detection of glaucoma and other disorders of the eye. Is the patient up to date with their annual eye exam?  Yes  Who is the provider or what is the name of the office in which the patient attends annual eye exams? [facility] If pt is not established with a provider, would they like to be referred to a provider to establish care? No .   Dental Screening: Recommended annual dental exams for proper oral hygiene  Community Resource Referral / Chronic Care Management: CRR required this visit?  No   CCM required this visit?  No      Plan:     I have personally reviewed and noted the following in the patient's chart:   . Medical and social history . Use of alcohol, tobacco or illicit drugs  . Current medications and supplements . Functional ability and status . Nutritional status . Physical activity . Advanced directives . List of other physicians . Hospitalizations, surgeries, and ER visits in previous 12 months . Vitals . Screenings to include cognitive, depression, and falls . Referrals and appointments  In addition, I have reviewed and discussed with patient certain preventive protocols, quality metrics, and best practice recommendations. A written personalized care plan for preventive services as well as general preventive health recommendations were provided to patient.     Willette Brace, LPN   06/07/9756   Nurse Notes: None

## 2020-07-13 ENCOUNTER — Other Ambulatory Visit: Payer: Self-pay | Admitting: Family Medicine

## 2020-08-15 ENCOUNTER — Other Ambulatory Visit: Payer: Self-pay | Admitting: Family Medicine

## 2020-08-15 DIAGNOSIS — Z1231 Encounter for screening mammogram for malignant neoplasm of breast: Secondary | ICD-10-CM

## 2020-09-14 ENCOUNTER — Other Ambulatory Visit: Payer: Self-pay | Admitting: Family Medicine

## 2020-09-27 ENCOUNTER — Telehealth: Payer: Self-pay

## 2020-09-27 ENCOUNTER — Other Ambulatory Visit: Payer: Self-pay | Admitting: *Deleted

## 2020-09-27 MED ORDER — TRIAMCINOLONE ACETONIDE 0.5 % EX CREA: TOPICAL_CREAM | CUTANEOUS | 0 refills | Status: DC

## 2020-09-27 NOTE — Telephone Encounter (Signed)
Ok with me. Please place any necessary orders. 

## 2020-09-27 NOTE — Telephone Encounter (Signed)
.   LAST APPOINTMENT DATE: 09/14/2020   NEXT APPOINTMENT DATE:@Visit  date not found  MEDICATION:triamcinolone cream (KENALOG) 0.5 %   PHARMACY:[id number] - [place], [place] - [address] AT [address]

## 2020-09-27 NOTE — Telephone Encounter (Signed)
Rx send to pharmacy  

## 2020-09-27 NOTE — Telephone Encounter (Signed)
Ok to refill? Last OV 04/2020 Last refill 05/07/2019

## 2020-10-07 ENCOUNTER — Ambulatory Visit: Payer: Medicare Other

## 2020-10-08 ENCOUNTER — Other Ambulatory Visit: Payer: Self-pay

## 2020-10-08 ENCOUNTER — Ambulatory Visit
Admission: RE | Admit: 2020-10-08 | Discharge: 2020-10-08 | Disposition: A | Discharge status: Home / Self Care | Payer: Medicare Other | Source: Ambulatory Visit | Attending: Family Medicine | Admitting: Family Medicine

## 2020-10-08 DIAGNOSIS — Z1231 Encounter for screening mammogram for malignant neoplasm of breast: Secondary | ICD-10-CM

## 2020-10-27 IMAGING — MG DIGITAL SCREENING BILATERAL MAMMOGRAM WITH TOMO AND CAD
8 series · 8 of 24 positions shown · non-contrast
Comparison: Previous exam(s).

CLINICAL DATA: Screening.

EXAM:
DIGITAL SCREENING BILATERAL MAMMOGRAM WITH TOMO AND CAD

[L MLO synth-2D]
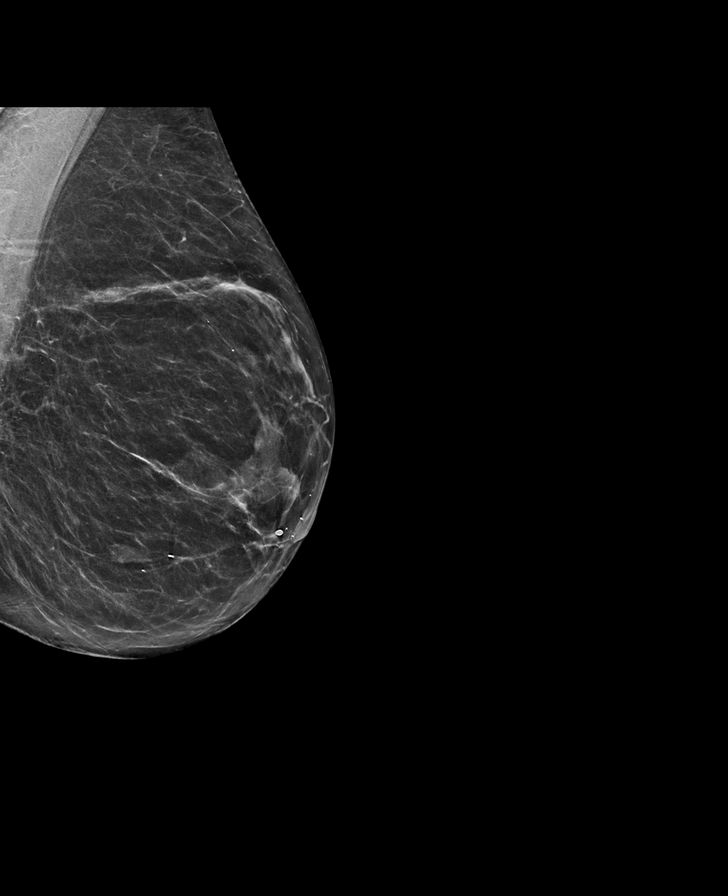

[L CC synth-2D]
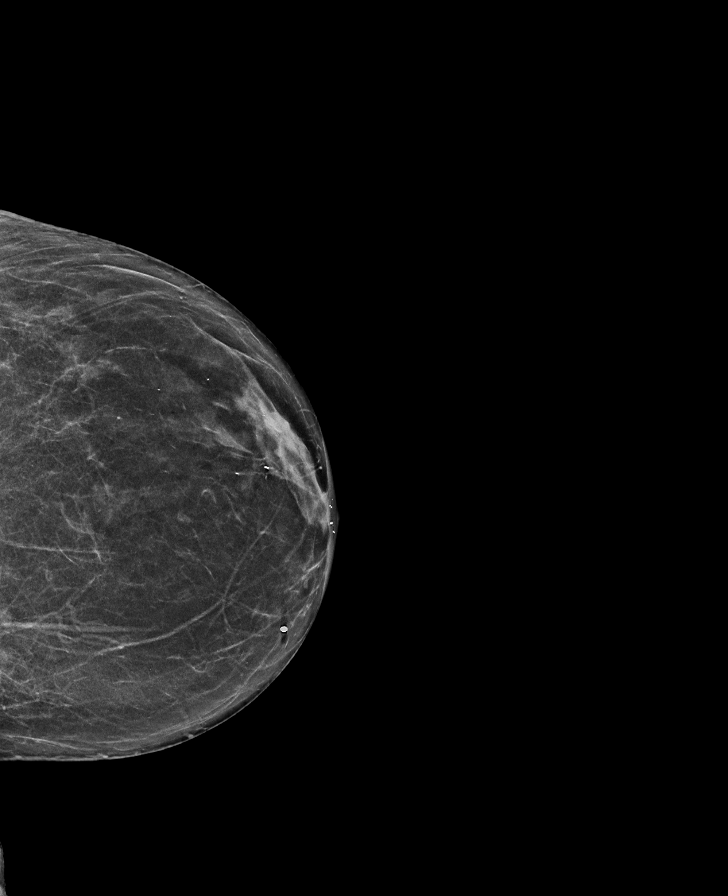

[R MLO synth-2D]
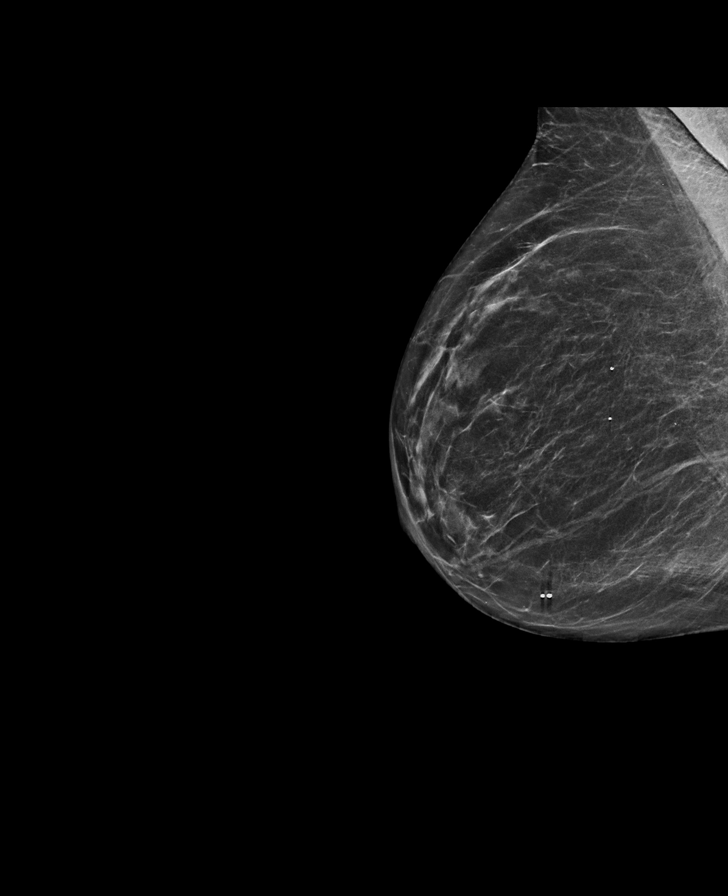

[R CC synth-2D]
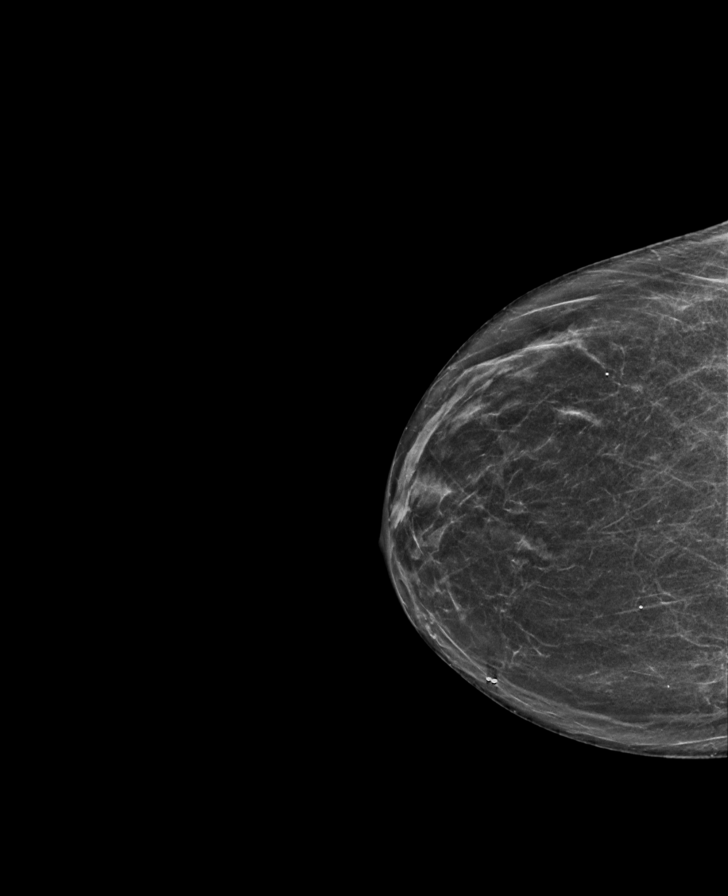

[R MLO tomo · tomo slice 32/63.0]
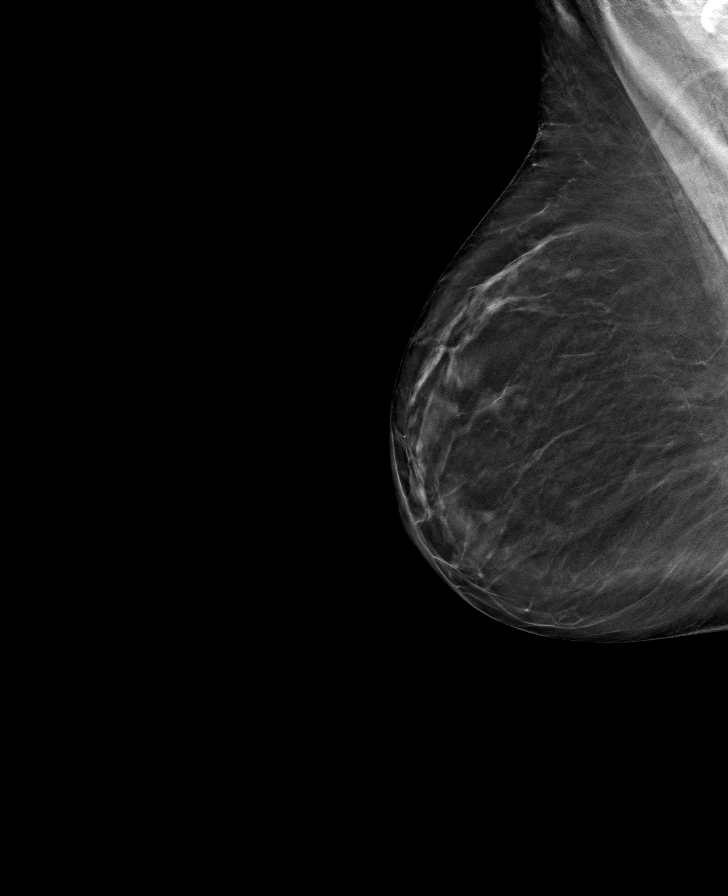

[R CC tomo · tomo slice 30/59.0]
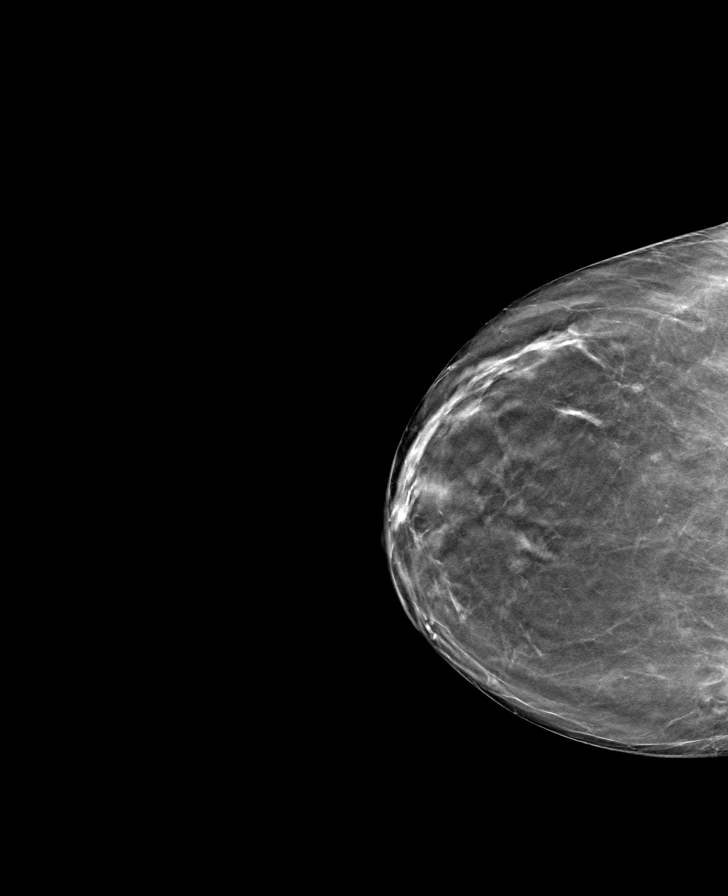

[L MLO tomo · tomo slice 33/65.0]
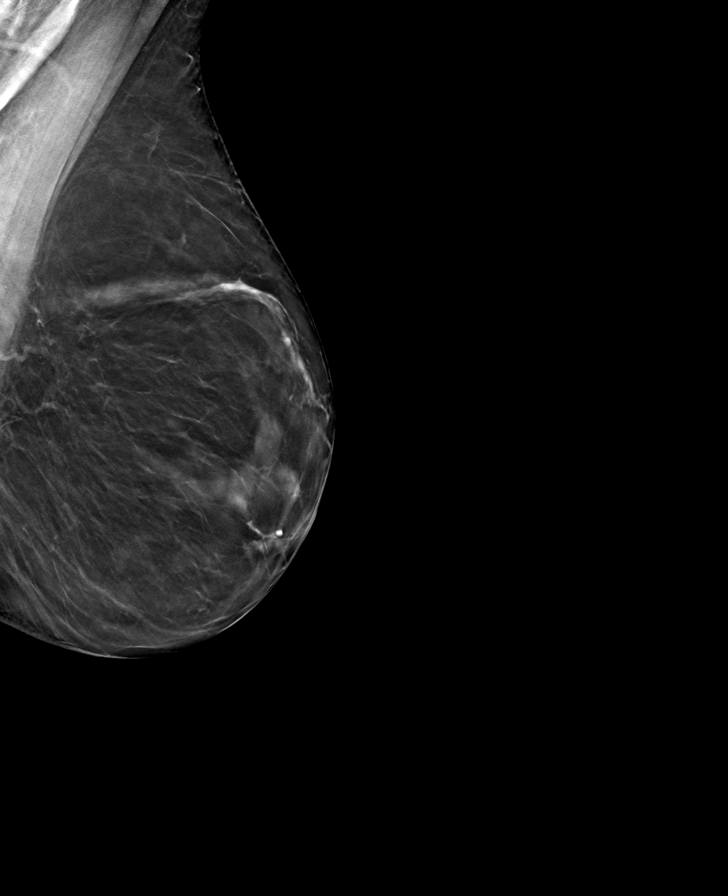

[L CC tomo · tomo slice 31/60.0]
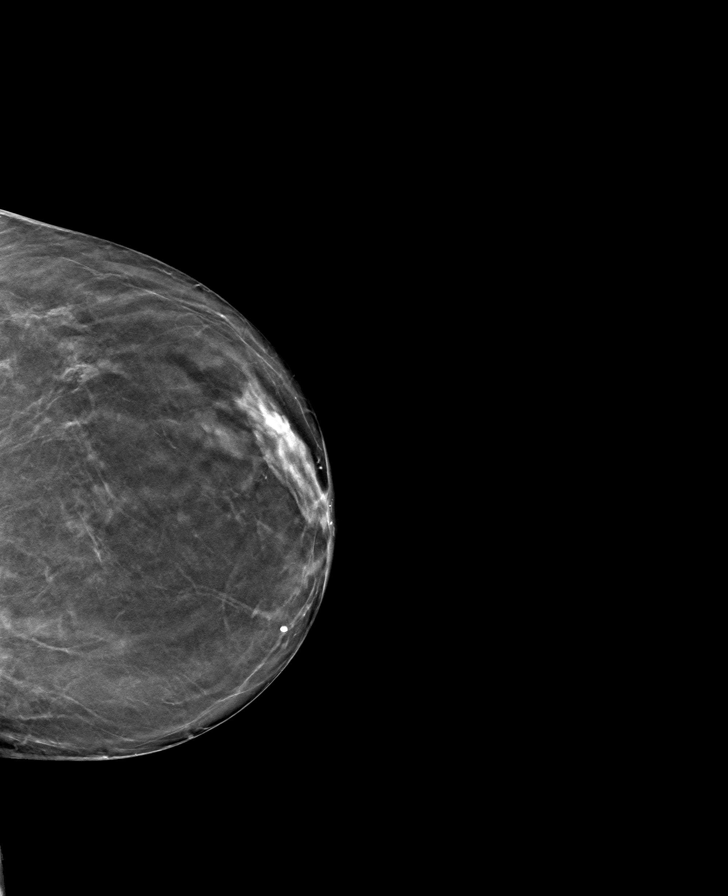

[8 of 24 positions shown; findings below may reference images not displayed]

ACR Breast Density Category b: There are scattered areas of
fibroglandular density.
FINDINGS: There are no findings suspicious for malignancy. Images were
processed with CAD.
IMPRESSION: No mammographic evidence of malignancy. A result letter of this
screening mammogram will be mailed directly to the patient.

RECOMMENDATION:
Screening mammogram in one year. (Code:[id number])

BI-RADS CATEGORY  1: Negative.

## 2020-10-28 ENCOUNTER — Telehealth: Payer: Self-pay | Admitting: Family Medicine

## 2020-10-28 NOTE — Chronic Care Management (AMB) (Signed)
  Chronic Care Management   Note  10/28/2020 Name: Ellen [name] MRN: [id number] DOB: 09-30-1940  Ellen [name] is a 80 y.o. year old female who is a primary care patient of Vivi Barrack, MD. I reached out to Lorella Nimrod by phone today in response to a referral sent by [name]'s PCP, Vivi Barrack, MD.   [name] was given information about Chronic Care Management services today including:  CCM service includes personalized support from designated clinical staff supervised by her physician, including individualized plan of care and coordination with other care providers 24/7 contact phone numbers for assistance for urgent and routine care needs. Service will only be billed when office clinical staff spend 20 minutes or more in a month to coordinate care. Only one practitioner may furnish and bill the service in a calendar month. The patient may stop CCM services at any time (effective at the end of the month) by phone call to the office staff.   Patient agreed to services and verbal consent obtained.   Follow up plan:   Lauretta Grill Upstream Scheduler

## 2020-11-09 ENCOUNTER — Other Ambulatory Visit: Payer: Self-pay | Admitting: Family Medicine

## 2020-11-26 ENCOUNTER — Encounter: Payer: Self-pay | Admitting: Physician Assistant

## 2020-11-26 ENCOUNTER — Ambulatory Visit (INDEPENDENT_AMBULATORY_CARE_PROVIDER_SITE_OTHER): Payer: Medicare Other | Admitting: Physician Assistant

## 2020-11-26 ENCOUNTER — Other Ambulatory Visit: Payer: Self-pay

## 2020-11-26 VITALS — BP 144/73 | HR 67 | Temp 97.1°F | Ht 64.0 in | Wt 115.2 lb

## 2020-11-26 DIAGNOSIS — M21619 Bunion of unspecified foot: Secondary | ICD-10-CM

## 2020-11-26 DIAGNOSIS — B070 Plantar wart: Secondary | ICD-10-CM | POA: Diagnosis not present

## 2020-11-26 DIAGNOSIS — M2041 Other hammer toe(s) (acquired), right foot: Secondary | ICD-10-CM

## 2020-11-26 NOTE — Progress Notes (Signed)
Acute Office Visit  Subjective:    Patient ID: Ellen [name], female    DOB: 29-Apr-1941, 80 y.o.   MRN: [id number]  Chief Complaint  Patient presents with   Foot Pain    Foot Pain  Patient is in today for right foot pain x 1 year. No known injury. States a referral was put in last year, but she didn't have time to go because she cares for her husband and his neuropathy. Gardener, on her feet all the time. States this is just "general wear and tear" that she wants to finally get taken care of.    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   Left Buccal Cheek (Keratoacanthoma)    Past Surgical History:  Procedure Laterality Date   BOWEL RESECTION  07/09/2013   Procedure: SMALL BOWEL RESECTION;  Surgeon: Earnstine Regal, MD;  Location: [facility];  Service: General;;   LAPAROTOMY N/A 07/09/2013   Procedure: EXPLORATORY LAPAROTOMY;  Surgeon: Earnstine Regal, MD;  Location: [facility];  Service: General;  Laterality: N/A;    Family History  Problem Relation Age of Onset   Diabetes Mother    Heart attack Mother    CAD Father    Heart attack Father    Breast cancer Neg Hx     Social History   Socioeconomic History   Marital status: Married    Spouse name: Not on file   Number of children: Not on file   Years of education: Not on file   Highest education level: Not on file  Occupational History   Occupation: Retired   Tobacco Use   Smoking status: Former    Pack years: 0.00    Types: Cigarettes   Smokeless tobacco: Never  Vaping Use   Vaping Use: Never used  Substance and Sexual Activity   Alcohol use: Yes    Alcohol/week: 7.0 standard drinks    Types: 7 Glasses of wine per week   Drug use: No   Sexual activity: Not Currently  Other Topics Concern   Not on file  Social History Narrative   Enjoys gardening    Social Determinants of Health   Financial Resource Strain: Low  Risk    Difficulty of Paying Living Expenses: Not hard at all  Food Insecurity: No Food Insecurity   Worried About Charity fundraiser in the Last Year: Never true   [place] in the Last Year: Never true  Transportation Needs: No Transportation Needs   Lack of Transportation (Medical): No   Lack of Transportation (Non-Medical): No  Physical Activity: Sufficiently Active   Days of Exercise per Week: 7 days   Minutes of Exercise per Session: 120 min  Stress: No Stress Concern Present   Feeling of Stress : Not at all  Social Connections: Moderately Isolated   Frequency of Communication with Friends and Family: Never   Frequency of Social Gatherings with Friends and Family: Once a week   Attends Religious Services: More than 4 times per year   Active Member of [employer] or Organizations: No   Attends Archivist Meetings: Never   Marital Status: Married  Human resources officer Violence: Not At Risk   Fear of Current or Ex-Partner: No   Emotionally Abused: No   Physically Abused: No   Sexually Abused: No    Outpatient Medications Prior to Visit  Medication  Sig Dispense Refill   furosemide (LASIX) 20 MG tablet TAKE 1 TABLET(20 MG) BY MOUTH DAILY 30 tablet 3   hydrochlorothiazide (MICROZIDE) 12.5 MG capsule TAKE 1 CAPSULE(12.5 MG) BY MOUTH DAILY 90 capsule 1   losartan (COZAAR) 50 MG tablet TAKE 1 TABLET(50 MG) BY MOUTH DAILY 90 tablet 1   naproxen sodium (ALEVE) 220 MG tablet Aleve 220 mg tablet   1 tablet every day by oral route.     PREMPRO 0.45-1.5 MG tablet TAKE 1 TABLET BY MOUTH DAILY 28 tablet 0   rifaximin (XIFAXAN) 550 MG TABS tablet Take 550 mg by mouth 3 (three) times daily. Only for 15 days     triamcinolone cream (KENALOG) 0.5 % Mix 1:1 with lotion and apply twice daily for 1-2 weeks. 30 g 0   No facility-administered medications prior to visit.    Allergies  Allergen Reactions   Lactose Intolerance (Gi) Diarrhea   Sulfa Antibiotics Nausea And Vomiting and  Swelling    Review of Systems REFER TO HPI FOR PERTINENT POSITIVES AND NEGATIVES     Objective:    Physical Exam Musculoskeletal:     Right foot: Normal range of motion and normal capillary refill. Deformity (2nd and 3rd hammer toes) and bunion present. No foot drop. Normal pulse.    BP (!) 144/73   Pulse 67   Temp (!) 97.1 F (36.2 C)   Ht 5\' 4"  (1.626 m)   Wt 115 lb 3.2 oz (52.3 kg)   SpO2 97%   BMI 19.77 kg/m  Wt Readings from Last 3 Encounters:  11/26/20 115 lb 3.2 oz (52.3 kg)  06/27/20 114 lb 6.4 oz (51.9 kg)  06/06/20 114 lb 3.2 oz (51.8 kg)    Health Maintenance Due  Topic Date Due   Zoster Vaccines- Shingrix (1 of 2) Never done   COVID-19 Vaccine (4 - Booster for Pfizer series) 07/21/2020    There are no preventive care reminders to display for this patient.   Lab Results  Component Value Date   TSH 1.76 10/21/2016   Lab Results  Component Value Date   WBC 5.9 05/16/2019   HGB 15.2 (H) 05/16/2019   HCT 45.1 05/16/2019   MCV 97.0 05/16/2019   PLT 218.0 05/16/2019   Lab Results  Component Value Date   NA 139 05/16/2019   K 3.8 05/16/2019   CO2 30 05/16/2019   GLUCOSE 99 05/16/2019   BUN 25 (H) 04/17/2020   CREATININE 0.86 04/17/2020   BILITOT 0.7 05/16/2019   ALKPHOS 71 05/16/2019   AST 16 05/16/2019   ALT 15 05/16/2019   PROT 5.8 (L) 05/16/2019   ALBUMIN 3.8 05/16/2019   CALCIUM 9.0 05/16/2019   ANIONGAP 7 01/10/2015   GFR 81.83 05/16/2019   Lab Results  Component Value Date   CHOL 159 05/16/2019   Lab Results  Component Value Date   HDL 56.20 05/16/2019   Lab Results  Component Value Date   LDLCALC 83 05/16/2019   Lab Results  Component Value Date   TRIG 98.0 05/16/2019   Lab Results  Component Value Date   CHOLHDL 3 05/16/2019   Lab Results  Component Value Date   HGBA1C 5.4 10/05/2017       Assessment & Plan:   Problem List Items Addressed This Visit   None   1. Bunion 2. Hammer toe of right foot 3.  Plantar wart of right foot No acute changes. Referral to podiatry per patient request today.    Luceal Hollibaugh  M Nelma Phagan, PA-C

## 2020-12-04 ENCOUNTER — Encounter: Payer: Self-pay | Admitting: Podiatry

## 2020-12-04 ENCOUNTER — Ambulatory Visit (INDEPENDENT_AMBULATORY_CARE_PROVIDER_SITE_OTHER): Payer: Medicare Other | Admitting: Podiatry

## 2020-12-04 ENCOUNTER — Ambulatory Visit (INDEPENDENT_AMBULATORY_CARE_PROVIDER_SITE_OTHER): Payer: Medicare Other

## 2020-12-04 ENCOUNTER — Other Ambulatory Visit: Payer: Self-pay

## 2020-12-04 DIAGNOSIS — M778 Other enthesopathies, not elsewhere classified: Secondary | ICD-10-CM

## 2020-12-04 DIAGNOSIS — M79671 Pain in right foot: Secondary | ICD-10-CM

## 2020-12-04 DIAGNOSIS — L84 Corns and callosities: Secondary | ICD-10-CM

## 2020-12-04 DIAGNOSIS — M205X1 Other deformities of toe(s) (acquired), right foot: Secondary | ICD-10-CM | POA: Diagnosis not present

## 2020-12-04 MED ORDER — TRIAMCINOLONE ACETONIDE 10 MG/ML IJ SUSP
10.0000 mg | Freq: Once | INTRAMUSCULAR | Status: AC
  Administered 2020-12-04: 10 mg

## 2020-12-05 NOTE — Progress Notes (Signed)
Subjective:   Patient ID: Ellen [name], female   DOB: 80 y.o.   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  States it makes it hard to wear shoe gear comfortably and she knows someday she needs some form of correction.  She does not smoke likes to be active   Review of Systems  All other systems reviewed and are negative.      Objective:  Physical Exam Vitals and nursing note reviewed.  Constitutional:      Appearance: She is well-developed.  Pulmonary:     Effort: Pulmonary effort is normal.  Musculoskeletal:        General: Normal range of motion.  Skin:    General: Skin is warm.  Neurological:     Mental Status: She is alert.    Neurovascular status is found to be intact muscle strength adequate range of motion adequate.  Patient has large deformity around the first metatarsal head right with large dorsal spurring and inflammation fluid buildup around the joint surface and also has lesion plantar aspect right foot that is small but painful in the midfoot measuring approximately 4 x 4 millimeter.  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  I also debrided the plantar lesion applied padding and possibly we could consider excision of this lesion at the time of surgery.  Patient will be seen back and does understand surgical intervention in this case likes to get it done but needs to wait on timing  X-rays indicate significant spurring around the first  metatarsal head right with multiple signs of severe arthritis of the joint surface

## 2020-12-18 ENCOUNTER — Telehealth: Payer: Self-pay | Admitting: Pharmacist

## 2020-12-18 NOTE — Chronic Care Management (AMB) (Addendum)
Chronic Care Management Pharmacy Assistant   Name: Ellen [name]  MRN: [id number] DOB: 13-Mar-1941  Ellen [name] is an 80 y.o. year old female who presents for her initial CCM visit with the clinical pharmacist.  Reason for Encounter: Chart Review For Initial Visit With Clinical Pharmacist   Conditions to be addressed/monitored: HTN, Hyperglycemia, OA of carpometacarpal joint of thumb, Diverticulosis, Hammer Toe  Primary concerns for visit include: HTN, Hyperglycemia, OA of carpometacarpal joint of thumb, Diverticulosis  Recent office visits:  11/26/2020 OV Allwardt, Randa Evens. PA-C; acute visit for bunion, no medication changes indicated.  Recent consult visits:  12/04/2020 OV (podiatry) [name], DPM; right foot pain, no medication changes indicated.  Hospital visits:  None in previous 6 months  Medications: Outpatient Encounter Medications as of 12/18/2020  Medication Sig   furosemide (LASIX) 20 MG tablet TAKE 1 TABLET(20 MG) BY MOUTH DAILY   hydrochlorothiazide (MICROZIDE) 12.5 MG capsule TAKE 1 CAPSULE(12.5 MG) BY MOUTH DAILY   losartan (COZAAR) 50 MG tablet TAKE 1 TABLET(50 MG) BY MOUTH DAILY   naproxen sodium (ALEVE) 220 MG tablet Aleve 220 mg tablet   1 tablet every day by oral route.   PREMPRO 0.45-1.5 MG tablet TAKE 1 TABLET BY MOUTH DAILY   rifaximin (XIFAXAN) 550 MG TABS tablet Take 550 mg by mouth 3 (three) times daily. Only for 15 days   triamcinolone cream (KENALOG) 0.5 % Mix 1:1 with lotion and apply twice daily for 1-2 weeks.   No facility-administered encounter medications on file as of 12/18/2020.    Current Medications: Furosemide 20 mg once a day last filled 11/11/2020 30 DS Triamcinolone 0.5% cream last filled 09/27/2020 Prempro 0.45-1.5 last filled 09/18/2020 28 DS HCTZ 12.5 mg once a day last filled last filled 10/05/2020 90 DS Losartan 50 mg once a day last filled 10/05/2020 90 DS Rifaximin 550 mg once a day last filled - patient  states this medication does not sound familiar Naproxen Sodium (Aleve) 220 mg  Patient Questions: Any changes in your medications or health? Patient states she has not had any changes recently with her health.  Any side effects from any medications?  Patient states she has not had any side effects from any of her medication.  Do you have any symptoms or problems not managed by your medications? Patient states she has a history of Diverticulitis, so she will take Tums once in a while.  Any concerns about your health right now? Patient stated "No, I'm just getting older."  Has your provider asked that you check blood pressure, blood sugar, or follow special diet at home? Patient states she does not check her blood pressure or blood sugar at home. She does not follow any special diet.  Do you get any type of exercise on a regular basis? 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  Can you think of a goal you would like to reach for your health? Patient states she feels as if she is doing great and doesn't have a goal for herself at this time.  Do you have any problems getting your medications? Patient states she does not have any problems getting her medications.  Is there anything that you would like to discuss during the appointment?  Patient states he can not think of anything that she would like to discuss at this time.  Please bring medications and supplements to appointment.  Future Appointments  Date Time Provider [employer]  12/23/2020  9:00 AM [facility] CCM PHARMACIST [facility]  06/03/2021 10:00 AM Robyne Askew R, PA-C [facility]  07/03/2021  2:30 PM [facility] HEALTH COACH [facility]    Star Rating Drugs: Losartan 50 mg once a day last filled 10/05/2020 90 DS  April D Calhoun, [place] Pharmacist Assistant [phone]

## 2020-12-20 NOTE — Progress Notes (Signed)
Chronic Care Management Pharmacy Note  12/23/2020 Name:  Ellen [name] MRN:  [id number] DOB:  [birth year]  Summary: Initial PharmD visit for CCM.  All meds reviewed and updated.  Discussed BP goals and risks/cost of Prempro.  Recommendations/Changes made from today's visit: Will research assistance for Prempro Continue to monitor kidneys with Naproxen use  Plan: FU 6 months   Subjective: Ellen [name] is an 80 y.o. year old female who is a primary patient of Jerline Pain, Algis Greenhouse, MD.  The CCM team was consulted for assistance with disease management and care coordination needs.    Engaged with patient face to face for initial visit in response to provider referral for pharmacy case management and/or care coordination services.   Consent to Services:  The patient was given the following information about Chronic Care Management services today, agreed to services, and gave verbal consent: 1. CCM service includes personalized support from designated clinical staff supervised by the primary care provider, including individualized plan of care and coordination with other care providers 2. 24/7 contact phone numbers for assistance for urgent and routine care needs. 3. Service will only be billed when office clinical staff spend 20 minutes or more in a month to coordinate care. 4. Only one practitioner may furnish and bill the service in a calendar month. 5.The patient may stop CCM services at any time (effective at the end of the month) by phone call to the office staff. 6. The patient will be responsible for cost sharing (co-pay) of up to 20% of the service fee (after annual deductible is met). Patient agreed to services and consent obtained.  Patient Care Team: Vivi Barrack, MD as PCP - General (Family Medicine) Warren Danes, PA-C as Physician Assistant (Dermatology) Edythe Clarity, [facility] as Pharmacist (Pharmacist)  Recent office visits:  11/26/2020 OV Allwardt, Randa Evens.  PA-C; acute visit for bunion, no medication changes indicated.   Recent consult visits:  12/04/2020 OV (podiatry) [name], DPM; right foot pain, no medication changes indicated.   Hospital visits:  None in previous 6 months   Objective:  Lab Results  Component Value Date   CREATININE 0.86 04/17/2020   BUN 25 (H) 04/17/2020   GFR 81.83 05/16/2019   GFRNONAA >60 01/10/2015   GFRAA >60 01/10/2015   NA 139 05/16/2019   K 3.8 05/16/2019   CALCIUM 9.0 05/16/2019   CO2 30 05/16/2019   GLUCOSE 99 05/16/2019    Lab Results  Component Value Date/Time   HGBA1C 5.4 10/05/2017 02:32 PM   GFR 81.83 05/16/2019 10:52 AM   GFR 87.33 10/05/2017 02:32 PM    Last diabetic Eye exam: No results found for: HMDIABEYEEXA  Last diabetic Foot exam: No results found for: HMDIABFOOTEX   Lab Results  Component Value Date   CHOL 159 05/16/2019   HDL 56.20 05/16/2019   LDLCALC 83 05/16/2019   TRIG 98.0 05/16/2019   CHOLHDL 3 05/16/2019    Hepatic Function Latest Ref Rng & Units 05/16/2019 04/07/2017 10/21/2016  Total Protein 6.0 - 8.3 g/dL 5.8(L) - 6.2  Albumin 3.5 - 5.2 g/dL 3.8 - 4.0  AST 0 - 37 U/L $Remo'16 14 16  'NaNio$ ALT 0 - 35 U/L $Remo'15 14 17  'MPImE$ Alk Phosphatase 39 - 117 U/L 71 80 78  Total Bilirubin 0.2 - 1.2 mg/dL 0.7 - 0.5    Lab Results  Component Value Date/Time   TSH 1.76 10/21/2016 08:07 AM    CBC Latest Ref Rng & Units  05/16/2019 04/07/2017 10/21/2016  WBC 4.0 - 10.5 K/uL 5.9 7.0 6.3  Hemoglobin 12.0 - 15.0 g/dL 15.2(H) 15.8 16.4(H)  Hematocrit 36.0 - 46.0 % 45.1 48(A) 48.8(H)  Platelets 150.0 - 400.0 K/uL 218.0 273 313    No results found for: VD25OH  Clinical ASCVD: No  The ASCVD Risk score Mikey Bussing DC Jr., et al., 2013) failed to calculate for the following reasons:   The 2013 ASCVD risk score is only valid for ages 10 to 76    Depression screen PHQ 2/9 06/27/2020 05/15/2019 07/25/2018  Decreased Interest 0 0 0  Down, Depressed, Hopeless 0 0 0  PHQ - 2 Score 0 0 0     Social  History   Tobacco Use  Smoking Status Former   Types: Cigarettes  Smokeless Tobacco Never   BP Readings from Last 3 Encounters:  11/26/20 (!) 144/73  06/27/20 138/78  06/06/20 118/72   Pulse Readings from Last 3 Encounters:  11/26/20 67  06/27/20 95  06/06/20 61   Wt Readings from Last 3 Encounters:  11/26/20 115 lb 3.2 oz (52.3 kg)  06/27/20 114 lb 6.4 oz (51.9 kg)  06/06/20 114 lb 3.2 oz (51.8 kg)   BMI Readings from Last 3 Encounters:  11/26/20 19.77 kg/m  06/27/20 19.64 kg/m  06/06/20 19.60 kg/m    Assessment/Interventions: Review of patient past medical history, allergies, medications, health status, including review of consultants reports, laboratory and other test data, was performed as part of comprehensive evaluation and provision of chronic care management services.   SDOH:  (Social Determinants of Health) assessments and interventions performed: Yes  Financial Resource Strain: Low Risk    Difficulty of Paying Living Expenses: Not hard at all    SDOH Screenings   Alcohol Screen: Not on file  Depression (PHQ2-9): Low Risk    PHQ-2 Score: 0  Financial Resource Strain: Low Risk    Difficulty of Paying Living Expenses: Not hard at all  Food Insecurity: No Food Insecurity   Worried About Charity fundraiser in the Last Year: Never true   Ran Out of Food in the Last Year: Never true  Housing: Low Risk    Last Housing Risk Score: 0  Physical Activity: Sufficiently Active   Days of Exercise per Week: 7 days   Minutes of Exercise per Session: 120 min  Social Connections: Moderately Isolated   Frequency of Communication with Friends and Family: Never   Frequency of Social Gatherings with Friends and Family: Once a week   Attends Religious Services: More than 4 times per year   Active Member of [employer] or Organizations: No   Attends Music therapist: Never   Marital Status: Married  Stress: No Stress Concern Present   Feeling of Stress : Not at  all  Tobacco Use: Medium Risk   Smoking Tobacco Use: Former   Smokeless Tobacco Use: Never  Transportation Needs: No Data processing manager (Medical): No   Lack of Transportation (Non-Medical): No    CCM Care Plan  Allergies  Allergen Reactions   Lactose Intolerance (Gi) Diarrhea   Sulfa Antibiotics Nausea And Vomiting and Swelling    Medications Reviewed Today     Reviewed by Edythe Clarity, [facility] (Pharmacist) on 12/23/20 at 1000  Med List Status: <None>   Medication Order Taking? Sig Documenting Provider Last Dose Status Informant  furosemide (LASIX) 20 MG tablet [id number] Yes TAKE 1 TABLET(20 MG) BY MOUTH DAILY Vivi Barrack, MD Taking  Active   hydrochlorothiazide (MICROZIDE) 12.5 MG capsule [id number] Yes TAKE 1 CAPSULE(12.5 MG) BY MOUTH DAILY Vivi Barrack, MD Taking Active   losartan (COZAAR) 50 MG tablet [id number] Yes TAKE 1 TABLET(50 MG) BY MOUTH DAILY Vivi Barrack, MD Taking Active   naproxen sodium (ALEVE) 220 MG tablet [id number] Yes Aleve 220 mg tablet   1 tablet every day by oral route. [provider] Taking Active   PREMPRO 0.45-1.5 MG tablet [id number] Yes TAKE 1 TABLET BY MOUTH DAILY Vivi Barrack, MD Taking Active   rifaximin (XIFAXAN) 550 MG TABS tablet [id number] Yes Take 550 mg by mouth 3 (three) times daily. Only for 15 days [provider] Taking Active   triamcinolone cream (KENALOG) 0.5 % [id number] Yes Mix 1:1 with lotion and apply twice daily for 1-2 weeks. 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/10/2013 07/11/2013   Hypertension 07/09/2013    Immunization History  Administered Date(s) Administered   Fluad Quad(high Dose 65+) 02/17/2019, 06/27/2020   Influenza, High Dose Seasonal PF 07/25/2018   PFIZER(Purple Top)SARS-COV-2 Vaccination 07/28/2019, 08/18/2019, 04/22/2020    Conditions to be addressed/monitored:  HTN, Edema, Osteoarthritis  Care Plan : General Pharmacy (Adult)  Updates made by Edythe Clarity, RPH since 12/23/2020 12:00 AM     Problem: HTN, Edema, Osteoarthritis   Priority: High  Onset Date: 12/23/2020     Long-Range Goal: Patient-Specific Goal   Start Date: 12/23/2020  Expected End Date: 06/25/2021  This Visit's Progress: On track  Priority: High  Note:   Current Barriers:  None at this time  Pharmacist Clinical Goal(s):  Patient will verbalize ability to afford treatment regimen maintain control of BP as evidenced by home monitoring  contact provider office for questions/concerns as evidenced notation of same in electronic health record through collaboration with PharmD and provider.   Interventions: 1:1 collaboration with Vivi Barrack, MD regarding development and update of comprehensive plan of care as evidenced by provider attestation and co-signature Inter-disciplinary care team collaboration (see longitudinal plan of care) Comprehensive medication review performed; medication list updated in electronic medical record  Hypertension  (Status:Goal on track: YES.)   Med Management Intervention: None  (BP goal <140/90) -Controlled -Current treatment: Losartan 100mg  daily HCTZ 12.5mg  daily -Medications previously tried: none noted  -Current home readings: no logs today, but she reports "normal"  -Current exercise habits: patient is a Human resources officer, 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 -Denies  hypotensive/hypertensive symptoms -Educated on BP goals and benefits of medications for prevention of heart attack, stroke and kidney damage; Exercise goal of 150 minutes per week; Importance of home blood pressure monitoring; Symptoms of hypotension and importance of maintaining adequate hydration; -Counseled to monitor BP at home a few times per week, document, and provide log at future appointments -Recommended to continue current medication Recommended to continue to monitor at home  Edema (Goal: Minimize symptoms) -  Controlled -Current treatment  Furosemide $RemoveBef'20mg'DwrGDPPXHF$  daily -Medications previously tried: none noted -Denies any recent swelling, she is only taking this on prn basis - when she does take it she takes it in the morning.  -Recommended to continue current medication Counseled on elevating feet whenever possible  Osteoarthritis (Goal: Minimize symptoms) -Controlled -Current treatment  Naproxen $RemoveB'220mg'OgdspRro$  daily -Medications previously tried: none noted -Has to take post shoulder surgery from years prior.  Kidney function remains stable with long term NSAID use.  -Recommended to continue current medication Continue to monitor kidney function.  Hot flashes (Goal: Minimize symptoms) -Controlled -Current treatment  Prempro 0.4-1.5 - patient is taking every two or three days -Medications previously tried: none noted -She reports high copay, will research to see if there is any assistance with these medications -Counseled on risks of long term HRT - patient aware and wants to continue to take  -Recommended to continue current medication  Patient Goals/Self-Care Activities Patient will:  - take medications as prescribed focus on medication adherence by pill box target a minimum of 150 minutes of moderate intensity exercise weekly  Follow Up Plan: The care management team will reach out to the patient again over the next 180 days.       Medication Assistance:  Possible  assistance needed with Prempro  Compliance/Adherence/Medication fill history: Care Gaps: Zoster Vaccine  Star-Rating Drugs: Losartan 50 mg once a day last filled 10/05/2020 90 DS  Patient's preferred pharmacy is:  [facility], [address] AT [address] [place] [address] Phone: [phone] Fax: [phone]  Uses pill box? Yes Pt endorses 100% compliance  We discussed: Current pharmacy is preferred with insurance plan and patient is satisfied with pharmacy services Patient decided to: Continue current medication management strategy  Care Plan and Follow Up Patient Decision:  Patient agrees to Care Plan and Follow-up.  Plan: The care management team will reach out to the patient again over the next 180 days.  Beverly Milch, PharmD Clinical Pharmacist  [phone]

## 2020-12-23 ENCOUNTER — Ambulatory Visit (INDEPENDENT_AMBULATORY_CARE_PROVIDER_SITE_OTHER): Payer: Medicare Other | Admitting: Pharmacist

## 2020-12-23 ENCOUNTER — Other Ambulatory Visit: Payer: Self-pay

## 2020-12-23 DIAGNOSIS — I10 Essential (primary) hypertension: Secondary | ICD-10-CM | POA: Diagnosis not present

## 2020-12-23 DIAGNOSIS — N951 Menopausal and female climacteric states: Secondary | ICD-10-CM

## 2020-12-23 DIAGNOSIS — R600 Localized edema: Secondary | ICD-10-CM

## 2020-12-23 NOTE — Patient Instructions (Addendum)
Visit Information   Goals Addressed             This Visit's Progress    Track and Manage My Blood Pressure-Hypertension       Timeframe:  Long-Range Goal Priority:  High Start Date:   12/23/20                          Expected End Date:  06/25/21                     Follow Up Date 04/16/21    - check blood pressure 3 times per week - choose a place to take my blood pressure (home, clinic or office, retail store) - write blood pressure results in a log or diary    Why is this important?   You won't feel high blood pressure, but it can still hurt your blood vessels.  High blood pressure can cause heart or kidney problems. It can also cause a stroke.  Making lifestyle changes like losing a little weight or eating less salt will help.  Checking your blood pressure at home and at different times of the day can help to control blood pressure.  If the doctor prescribes medicine remember to take it the way the doctor ordered.  Call the office if you cannot afford the medicine or if there are questions about it.     Notes:        Patient Care Plan: General Pharmacy (Adult)     Problem Identified: HTN, Edema, Osteoarthritis   Priority: High  Onset Date: 12/23/2020     Long-Range Goal: Patient-Specific Goal   Start Date: 12/23/2020  Expected End Date: 06/25/2021  This Visit's Progress: On track  Priority: High  Note:   Current Barriers:  None at this time  Pharmacist Clinical Goal(s):  Patient will verbalize ability to afford treatment regimen maintain control of BP as evidenced by home monitoring  contact provider office for questions/concerns as evidenced notation of same in electronic health record through collaboration with PharmD and provider.   Interventions: 1:1 collaboration with Vivi Barrack, MD regarding development and update of comprehensive plan of care as evidenced by provider attestation and co-signature Inter-disciplinary care team collaboration (see  longitudinal plan of care) Comprehensive medication review performed; medication list updated in electronic medical record  Hypertension  (Status:Goal on track: YES.)   Med Management Intervention: None  (BP goal <140/90) -Controlled -Current treatment: Losartan '100mg'$  daily HCTZ 12.'5mg'$  daily -Medications previously tried: none noted  -Current home readings: no logs today, but she reports "normal"  -Current exercise habits: patient is a Human resources officer, 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 -Denies hypotensive/hypertensive symptoms -Educated on BP goals and benefits of medications for prevention of heart attack, stroke and kidney damage; Exercise goal of 150 minutes per week; Importance of home blood pressure monitoring; Symptoms of hypotension and importance of maintaining adequate hydration; -Counseled to monitor BP at home a few times per week, document, and provide log at future appointments -Recommended to continue current medication Recommended to continue to monitor at home  Edema (Goal: Minimize symptoms) -Controlled -Current treatment  Furosemide '20mg'$  daily -Medications previously tried: none noted -Denies any recent swelling, she is only taking this on prn basis - when she does take it she takes it in the morning.  -Recommended to continue current medication Counseled on elevating feet  whenever possible  Osteoarthritis (Goal: Minimize symptoms) -Controlled -Current treatment  Naproxen '220mg'$  daily -Medications previously tried: none noted -Has to take post shoulder surgery from years prior.  Kidney function remains stable with long term NSAID use.  -Recommended to continue current medication Continue to monitor kidney function.  Hot flashes (Goal: Minimize symptoms) -Controlled -Current treatment  Prempro 0.4-1.5 - patient is taking every two or three days -Medications previously tried: none noted -She  reports high copay, will research to see if there is any assistance with these medications -Counseled on risks of long term HRT - patient aware and wants to continue to take  -Recommended to continue current medication  Patient Goals/Self-Care Activities Patient will:  - take medications as prescribed focus on medication adherence by pill box target a minimum of 150 minutes of moderate intensity exercise weekly  Follow Up Plan: The care management team will reach out to the patient again over the next 180 days.      Ms. Boulay was given information about Chronic Care Management services today including:  CCM service includes personalized support from designated clinical staff supervised by her physician, including individualized plan of care and coordination with other care providers 24/7 contact phone numbers for assistance for urgent and routine care needs. Standard insurance, coinsurance, copays and deductibles apply for chronic care management only during months in which we provide at least 20 minutes of these services. Most insurances cover these services at 100%, however patients may be responsible for any copay, coinsurance and/or deductible if applicable. This service may help you avoid the need for more expensive face-to-face services. Only one practitioner may furnish and bill the service in a calendar month. The patient may stop CCM services at any time (effective at the end of the month) by phone call to the office staff.  Patient agreed to services and verbal consent obtained.   The patient verbalized understanding of instructions, educational materials, and care plan provided today and agreed to receive a mailed copy of patient instructions, educational materials, and care plan.  Telephone follow up appointment with pharmacy team member scheduled for: 6 months  Edythe Clarity, [place]

## 2020-12-28 ENCOUNTER — Other Ambulatory Visit: Payer: Self-pay | Admitting: Family Medicine

## 2020-12-30 ENCOUNTER — Telehealth: Payer: Self-pay | Admitting: Pharmacist

## 2020-12-30 NOTE — Chronic Care Management (AMB) (Addendum)
    Chronic Care Management Pharmacy Assistant   Name: Ellen [name]  MRN: [id number] DOB: 1941-01-27  Reason for Encounter: CCM Care Plan   Medications: Outpatient Encounter Medications as of 12/30/2020  Medication Sig   furosemide (LASIX) 20 MG tablet TAKE 1 TABLET(20 MG) BY MOUTH DAILY   hydrochlorothiazide (MICROZIDE) 12.5 MG capsule TAKE 1 CAPSULE(12.5 MG) BY MOUTH DAILY   losartan (COZAAR) 50 MG tablet TAKE 1 TABLET(50 MG) BY MOUTH DAILY   naproxen sodium (ALEVE) 220 MG tablet Aleve 220 mg tablet   1 tablet every day by oral route.   PREMPRO 0.45-1.5 MG tablet TAKE 1 TABLET BY MOUTH DAILY   rifaximin (XIFAXAN) 550 MG TABS tablet Take 550 mg by mouth 3 (three) times daily. Only for 15 days   triamcinolone cream (KENALOG) 0.5 % Mix 1:1 with lotion and apply twice daily for 1-2 weeks.   No facility-administered encounter medications on file as of 12/30/2020.   CCM Care Plan (AVS) for visit with clinical pharmacist Beverly Milch, CPP on 12/23/2020 has been printed and mailed to the patients most recent address on file.   April D Calhoun, [place] Pharmacist Assistant [phone]

## 2021-01-01 ENCOUNTER — Ambulatory Visit (INDEPENDENT_AMBULATORY_CARE_PROVIDER_SITE_OTHER): Payer: Medicare Other | Admitting: Family Medicine

## 2021-01-01 ENCOUNTER — Encounter: Payer: Self-pay | Admitting: Family Medicine

## 2021-01-01 ENCOUNTER — Other Ambulatory Visit: Payer: Self-pay

## 2021-01-01 VITALS — BP 149/71 | HR 66 | Temp 98.1°F | Ht 64.0 in | Wt 117.6 lb

## 2021-01-01 DIAGNOSIS — M6208 Separation of muscle (nontraumatic), other site: Secondary | ICD-10-CM | POA: Diagnosis not present

## 2021-01-01 DIAGNOSIS — K5790 Diverticulosis of intestine, part unspecified, without perforation or abscess without bleeding: Secondary | ICD-10-CM | POA: Diagnosis not present

## 2021-01-01 DIAGNOSIS — I10 Essential (primary) hypertension: Secondary | ICD-10-CM

## 2021-01-01 DIAGNOSIS — K439 Ventral hernia without obstruction or gangrene: Secondary | ICD-10-CM | POA: Diagnosis not present

## 2021-01-01 NOTE — Assessment & Plan Note (Signed)
Evident on exam today and was present on CT scan last year.  No red flags.  Easily reducible today.  Will place referral to surgery for further management.

## 2021-01-01 NOTE — Patient Instructions (Signed)
It was very nice to see you today!  You have a ventral hernia.  This is probably a source of your pain.  We will place referral for you to see the surgeon for further management.  Take care, Dr Jerline Pain  PLEASE NOTE:  If you had any lab tests please let us know if you have not heard back within a few days. You may see your results on mychart before we have a chance to review them but we will give you a call once they are reviewed by [place]. If we ordered any referrals today, please let us know if you have not heard from their office within the next week.   Please try these tips to maintain a healthy lifestyle:  Eat at least 3 REAL meals and 1-2 snacks per day.  Aim for no more than 5 hours between eating.  If you eat breakfast, please do so within one hour of getting up.   Each meal should contain half fruits/vegetables, one quarter protein, and one quarter carbs (no bigger than a computer mouse)  Cut down on sweet beverages. This includes juice, soda, and sweet tea.   Drink at least 1 glass of water with each meal and aim for at least 8 glasses per day  Exercise at least 150 minutes every week.

## 2021-01-01 NOTE — Assessment & Plan Note (Signed)
Follows with GI.  Had a course of rifaximin with no improvement.  She will be following up with GI soon.

## 2021-01-01 NOTE — Assessment & Plan Note (Signed)
At goal per JNC 8.  Continue losartan-HCTZ 50-12.5 once daily.

## 2021-01-01 NOTE — Progress Notes (Signed)
   Ellen [name] is a 80 y.o. female who presents today for an office visit.  Assessment/Plan:  Chronic Problems Addressed Today: Hypertension At goal per JNC 8.  Continue losartan-HCTZ 50-12.5 once daily.  Ventral hernia Evident on exam today and was present on CT scan last year.  No red flags.  Easily reducible today.  Will place referral to surgery for further management.  Diverticulosis Follows with GI.  Had a course of rifaximin with no improvement.  She will be following up with GI soon.     Subjective:  HPI: CC of the patient is Hernia.  Has been present for several years.  Had a small bowel resection done 7 years ago and has pain at the site of incision.  Additionally she believes that the hernia has been getting bigger and that there is more scar tissue at the location. She denies nausea and vomiting.  Had CT scan done a month ago which showed small ventral hernia and diverticulosis without diverticulitis.  Since her last visit she has seen GI for diverticulosis.  She was given rifaximin which did not help.  Still has occasional diverticulosis flares.  Should be followed up with GI soon.  The pain is currently manageable but is very irritating.  Also she says that she experiences bloating and that "everything backs up". She denies constipation.         Objective:  Physical Exam: BP (!) 149/71   Pulse 66   Temp 98.1 F (36.7 C) (Temporal)   Ht '5\' 4"'$  (1.626 m)   Wt 117 lb 9.6 oz (53.3 kg)   SpO2 98%   BMI 20.19 kg/m   Gen: No acute distress, resting comfortably CV: Regular rate and rhythm with no murmurs appreciated Pulm: Normal work of breathing, clear to auscultation bilaterally with no crackles, wheezes, or rhonchi GI: Rectus diastases noted.  Ventral hernia noted at site of midline incision as well.  Ventral hernia easily reducible and mildly tender to palpation.  Bowel sounds present.  Soft, nontender, nondistended otherwise. Neuro: Grossly normal, moves  all extremities Psych: Normal affect and thought content      Hershal Eriksson M. Jerline Pain, MD 01/01/2021 11:33 AM

## 2021-01-04 ENCOUNTER — Other Ambulatory Visit: Payer: Self-pay | Admitting: Family Medicine

## 2021-01-29 DIAGNOSIS — K432 Incisional hernia without obstruction or gangrene: Secondary | ICD-10-CM | POA: Diagnosis not present

## 2021-01-29 DIAGNOSIS — M6208 Separation of muscle (nontraumatic), other site: Secondary | ICD-10-CM | POA: Diagnosis not present

## 2021-02-19 ENCOUNTER — Other Ambulatory Visit: Payer: Self-pay

## 2021-02-19 ENCOUNTER — Ambulatory Visit (INDEPENDENT_AMBULATORY_CARE_PROVIDER_SITE_OTHER): Payer: Medicare Other | Admitting: *Deleted

## 2021-02-19 DIAGNOSIS — Z23 Encounter for immunization: Secondary | ICD-10-CM

## 2021-03-07 ENCOUNTER — Encounter: Payer: Self-pay | Admitting: Family Medicine

## 2021-03-07 ENCOUNTER — Other Ambulatory Visit: Payer: Self-pay | Admitting: *Deleted

## 2021-03-07 ENCOUNTER — Ambulatory Visit (INDEPENDENT_AMBULATORY_CARE_PROVIDER_SITE_OTHER): Payer: Medicare Other | Admitting: Family Medicine

## 2021-03-07 ENCOUNTER — Telehealth: Payer: Self-pay | Admitting: *Deleted

## 2021-03-07 ENCOUNTER — Ambulatory Visit (HOSPITAL_COMMUNITY)
Admission: RE | Admit: 2021-03-07 | Discharge: 2021-03-07 | Disposition: A | Discharge status: Home / Self Care | Payer: Medicare Other | Source: Ambulatory Visit | Attending: Family Medicine | Admitting: Family Medicine

## 2021-03-07 ENCOUNTER — Other Ambulatory Visit: Payer: Self-pay

## 2021-03-07 VITALS — BP 155/75 | HR 68 | Temp 98.2°F | Ht 64.0 in | Wt 116.8 lb

## 2021-03-07 DIAGNOSIS — M7989 Other specified soft tissue disorders: Secondary | ICD-10-CM

## 2021-03-07 MED ORDER — MELOXICAM 7.5 MG PO TABS: 7.5000 mg | ORAL_TABLET | Freq: Every day | ORAL | 0 refills | Status: DC

## 2021-03-07 NOTE — Progress Notes (Signed)
Lower extremity venous has been completed.   Preliminary results in CV Proc.   Ellen [name] 03/07/2021 3:59 PM

## 2021-03-07 NOTE — Patient Instructions (Signed)
It was very nice to see you today!  We will check an ultrasound of your leg to make sure you do not have a blood clot.  You probably have a muscular strain.   We will contact you with the results once we have them.   Take care, Dr Jerline Pain  PLEASE NOTE:  If you had any lab tests please let us know if you have not heard back within a few days. You may see your results on mychart before we have a chance to review them but we will give you a call once they are reviewed by [place]. If we ordered any referrals today, please let us know if you have not heard from their office within the next week.   Please try these tips to maintain a healthy lifestyle:  Eat at least 3 REAL meals and 1-2 snacks per day.  Aim for no more than 5 hours between eating.  If you eat breakfast, please do so within one hour of getting up.   Each meal should contain half fruits/vegetables, one quarter protein, and one quarter carbs (no bigger than a computer mouse)  Cut down on sweet beverages. This includes juice, soda, and sweet tea.   Drink at least 1 glass of water with each meal and aim for at least 8 glasses per day  Exercise at least 150 minutes every week.

## 2021-03-07 NOTE — Progress Notes (Signed)
   Ellen [name] is a 80 y.o. female who presents today for an office visit.  Assessment/Plan:  Left Leg Pain Likely calf strain.  She has elevated well score.  We will check ultrasound to rule out DVT.  Once DVT is ruled out we will likely start course of anti-inflammatories.  Discussed home exercises and handout was given.      Subjective:  HPI:  Patient here with 2 weeks of left leg pain and swelling. Started after she starting doing strengthening exercises at the direction of her Psychologist, sport and exercise. Pain and swelling is located to the left part of her leg. Worsening the last few days.  No history of blood clot. No recent immobilization. No reported chest pain or shortness of breath.   The level of pain is not debilitating enough to prevent her from gardening.         Objective:  Physical Exam: BP (!) 155/75   Pulse 68   Temp 98.2 F (36.8 C) (Temporal)   Ht 5\' 4"  (1.626 m)   Wt 116 lb 12.8 oz (53 kg)   SpO2 96%   BMI 20.05 kg/m   Gen: No acute distress, resting comfortably CV: Regular rate and rhythm with no murmurs appreciated Pulm: Normal work of breathing, clear to auscultation bilaterally with no crackles, wheezes, or rhonchi MSK: Left leg edematous.  Tenderness palpation along posterior calf.  3+ pitting edema in left leg.  Neurovascular intact distally.  Positive Homans' sign on the left. Left calf 32cm in diameter. Right calf 28 cm in diameter.  Neuro: Grossly normal, moves all extremities Psych: Normal affect and thought content      I,Jordan Kelly,acting as a scribe for Dimas Chyle, MD.,have documented all relevant documentation on the behalf of Dimas Chyle, MD,as directed by  Dimas Chyle, MD while in the presence of Dimas Chyle, MD.  I, Dimas Chyle, MD, have reviewed all documentation for this visit. The documentation on 03/07/21 for the exam, diagnosis, procedures, and orders are all accurate and complete.  Time Spent: 50 minutes of total time was spent on the  date of the encounter performing the following actions: chart review prior to seeing the patient, obtaining history, performing a medically necessary exam, counseling on the treatment plan including need for stat imaging, placing orders, and documenting in our EHR.    Algis Greenhouse. Jerline Pain, MD 03/07/2021 2:36 PM

## 2021-03-07 NOTE — Telephone Encounter (Signed)
Vein and vascular called c Call report  Negative for DVT  Chronic SDT  Patient was send home

## 2021-03-07 NOTE — Progress Notes (Signed)
Please inform patient of the following:  Good news! Her ultrasound is negative for DVT. I think she probably has a muscular strain. Please send in meloxicam 7.5mg  once daily for 2 weeks. I would like for her to let us know if not improving in 1-2 weeks.  Algis Greenhouse. Jerline Pain, MD 03/07/2021 4:10 PM

## 2021-03-07 NOTE — Telephone Encounter (Signed)
See result note.  Ellen [name]. Jerline Pain, MD 03/07/2021 4:11 PM

## 2021-03-28 ENCOUNTER — Other Ambulatory Visit: Payer: Self-pay | Admitting: Family Medicine

## 2021-04-14 ENCOUNTER — Other Ambulatory Visit: Payer: Self-pay | Admitting: Family Medicine

## 2021-05-23 DIAGNOSIS — Z23 Encounter for immunization: Secondary | ICD-10-CM | POA: Diagnosis not present

## 2021-06-03 ENCOUNTER — Other Ambulatory Visit: Payer: Self-pay

## 2021-06-03 ENCOUNTER — Encounter: Payer: Self-pay | Admitting: Physician Assistant

## 2021-06-03 ENCOUNTER — Ambulatory Visit (INDEPENDENT_AMBULATORY_CARE_PROVIDER_SITE_OTHER): Payer: Medicare Other | Admitting: Physician Assistant

## 2021-06-03 DIAGNOSIS — Z85828 Personal history of other malignant neoplasm of skin: Secondary | ICD-10-CM | POA: Diagnosis not present

## 2021-06-03 DIAGNOSIS — L309 Dermatitis, unspecified: Secondary | ICD-10-CM

## 2021-06-03 DIAGNOSIS — Z1283 Encounter for screening for malignant neoplasm of skin: Secondary | ICD-10-CM | POA: Diagnosis not present

## 2021-06-03 DIAGNOSIS — L570 Actinic keratosis: Secondary | ICD-10-CM | POA: Diagnosis not present

## 2021-06-03 MED ORDER — TRIAMCINOLONE ACETONIDE 0.5 % EX CREA
TOPICAL_CREAM | CUTANEOUS | 0 refills | Status: DC
Start: 2021-06-03 — End: 2022-08-26

## 2021-06-09 ENCOUNTER — Encounter: Payer: Self-pay | Admitting: Physician Assistant

## 2021-06-09 NOTE — Progress Notes (Signed)
° °  Follow-Up Visit   Subjective  Ellen [name] is a 81 y.o. female who presents for the following: Annual Exam (Patient here today for yearly skin check, no new concerns. Personal history of non mole skin cancer. No personal history of atypical moles, or melanoma. No family history of atypical moles, melanoma or non mole skin cancer. ). She has been getting a rash that itches off and on for months.  Never been treated.    The following portions of the chart were reviewed this encounter and updated as appropriate:  Tobacco   Allergies   Meds   Problems   Med Hx   Surg Hx   Fam Hx       Objective  Well appearing patient in no apparent distress; mood and affect are within normal limits.  A full examination was performed including scalp, head, eyes, ears, nose, lips, neck, chest, axillae, abdomen, back, buttocks, bilateral upper extremities, bilateral lower extremities, hands, feet, fingers, toes, fingernails, and toenails. All findings within normal limits unless otherwise noted below.  Generalized and ears Thin scaly erythematous plaques.   Left Ear, Left Temple (3), Left Upper Arm - Anterior, Right Ear, Right Parotid Area (3) Erythematous patches with gritty scale.   Assessment & Plan  Dermatitis Generalized and ears  triamcinolone cream (KENALOG) 0.5 % - Generalized and ears Mix 1:1 with lotion and apply twice daily for 1-2 weeks.  Actinic keratosis (9) Left Ear; Right Ear; Left Upper Arm - Anterior; Left Temple (3); Right Parotid Area (3)  Destruction of lesion - Left Ear, Left Temple, Left Upper Arm - Anterior, Right Ear, Right Parotid Area Complexity: simple   Destruction method: cryotherapy   Informed consent: discussed and consent obtained   Timeout:  patient name, date of birth, surgical site, and procedure verified Lesion destroyed using liquid nitrogen: Yes   Cryotherapy cycles:  3 Outcome: patient tolerated procedure well with no complications   Post-procedure details:  wound care instructions given    Warned patient not to use it on her face extensively.   I, Javon Hupfer, PA-C, have reviewed all documentation's for this visit.  The documentation on 06/09/21 for the exam, diagnosis, procedures and orders are all accurate and complete.

## 2021-06-23 NOTE — Progress Notes (Unsigned)
Chronic Care Management Pharmacy Note  06/23/2021 Name:  Ellen [name] MRN:  [id number] DOB:  [birth year]  Summary: Initial PharmD visit for CCM.  All meds reviewed and updated.  Discussed BP goals and risks/cost of Prempro.  Recommendations/Changes made from today's visit: Will research assistance for Prempro Continue to monitor kidneys with Naproxen use  Plan: FU 6 months   Subjective: Ellen [name] is an 81 y.o. year old female who is a primary patient of Jerline Pain, Algis Greenhouse, MD.  The CCM team was consulted for assistance with disease management and care coordination needs.    Engaged with patient face to face for initial visit in response to provider referral for pharmacy case management and/or care coordination services.   Consent to Services:  The patient was given the following information about Chronic Care Management services today, agreed to services, and gave verbal consent: 1. CCM service includes personalized support from designated clinical staff supervised by the primary care provider, including individualized plan of care and coordination with other care providers 2. 24/7 contact phone numbers for assistance for urgent and routine care needs. 3. Service will only be billed when office clinical staff spend 20 minutes or more in a month to coordinate care. 4. Only one practitioner may furnish and bill the service in a calendar month. 5.The patient may stop CCM services at any time (effective at the end of the month) by phone call to the office staff. 6. The patient will be responsible for cost sharing (co-pay) of up to 20% of the service fee (after annual deductible is met). Patient agreed to services and consent obtained.  Patient Care Team: Vivi Barrack, MD as PCP - General (Family Medicine) Willette Pa Ronalee Red, PA-C as Physician Assistant (Dermatology) Edythe Clarity, [facility] as Pharmacist (Pharmacist)  Recent office visits:  03/07/21 Ellen [name], Ellen [name] - leg  swelling, negative for DVT likely muscle strain started anti inflammatories  11/26/2020 OV Allwardt, Ellen M. PA-C; acute visit for bunion, no medication changes indicated.   Recent consult visits:  12/04/2020 OV (podiatry) [name], DPM; right foot pain, no medication changes indicated.   Hospital visits:  None in previous 6 months   Objective:  Lab Results  Component Value Date   CREATININE 0.86 04/17/2020   BUN 25 (H) 04/17/2020   GFR 81.83 05/16/2019   GFRNONAA >60 01/10/2015   GFRAA >60 01/10/2015   NA 139 05/16/2019   K 3.8 05/16/2019   CALCIUM 9.0 05/16/2019   CO2 30 05/16/2019   GLUCOSE 99 05/16/2019    Lab Results  Component Value Date/Time   HGBA1C 5.4 10/05/2017 02:32 PM   GFR 81.83 05/16/2019 10:52 AM   GFR 87.33 10/05/2017 02:32 PM    Last diabetic Eye exam: No results found for: HMDIABEYEEXA  Last diabetic Foot exam: No results found for: HMDIABFOOTEX   Lab Results  Component Value Date   CHOL 159 05/16/2019   HDL 56.20 05/16/2019   LDLCALC 83 05/16/2019   TRIG 98.0 05/16/2019   CHOLHDL 3 05/16/2019    Hepatic Function Latest Ref Rng & Units 05/16/2019 04/07/2017 10/21/2016  Total Protein 6.0 - 8.3 g/dL 5.8(L) - 6.2  Albumin 3.5 - 5.2 g/dL 3.8 - 4.0  AST 0 - 37 U/L $Remo'16 14 16  'zuGDU$ ALT 0 - 35 U/L $Remo'15 14 17  'wCreI$ Alk Phosphatase 39 - 117 U/L 71 80 78  Total Bilirubin 0.2 - 1.2 mg/dL 0.7 - 0.5    Lab Results  Component Value  Date/Time   TSH 1.76 10/21/2016 08:07 AM    CBC Latest Ref Rng & Units 05/16/2019 04/07/2017 10/21/2016  WBC 4.0 - 10.5 K/uL 5.9 7.0 6.3  Hemoglobin 12.0 - 15.0 g/dL 15.2(H) 15.8 16.4(H)  Hematocrit 36.0 - 46.0 % 45.1 48(A) 48.8(H)  Platelets 150.0 - 400.0 K/uL 218.0 273 313    No results found for: VD25OH  Clinical ASCVD: No  The ASCVD Risk score (Arnett DK, et al., 2019) failed to calculate for the following reasons:   The 2019 ASCVD risk score is only valid for ages 20 to 76    Depression screen PHQ 2/9 03/07/2021  01/01/2021 06/27/2020  Decreased Interest 0 0 0  Down, Depressed, Hopeless 0 0 0  PHQ - 2 Score 0 0 0     Social History   Tobacco Use  Smoking Status Former   Types: Cigarettes  Smokeless Tobacco Never   BP Readings from Last 3 Encounters:  03/07/21 (!) 155/75  01/01/21 (!) 149/71  11/26/20 (!) 144/73   Pulse Readings from Last 3 Encounters:  03/07/21 68  01/01/21 66  11/26/20 67   Wt Readings from Last 3 Encounters:  03/07/21 116 lb 12.8 oz (53 kg)  01/01/21 117 lb 9.6 oz (53.3 kg)  11/26/20 115 lb 3.2 oz (52.3 kg)   BMI Readings from Last 3 Encounters:  03/07/21 20.05 kg/m  01/01/21 20.19 kg/m  11/26/20 19.77 kg/m    Assessment/Interventions: Review of patient past medical history, allergies, medications, health status, including review of consultants reports, laboratory and other test data, was performed as part of comprehensive evaluation and provision of chronic care management services.   SDOH:  (Social Determinants of Health) assessments and interventions performed: Yes  Financial Resource Strain: Low Risk    Difficulty of Paying Living Expenses: Not hard at all    SDOH Screenings   Alcohol Screen: Not on file  Depression (PHQ2-9): Low Risk    PHQ-2 Score: 0  Financial Resource Strain: Low Risk    Difficulty of Paying Living Expenses: Not hard at all  Food Insecurity: No Food Insecurity   Worried About Charity fundraiser in the Last Year: Never true   Ran Out of Food in the Last Year: Never true  Housing: Low Risk    Last Housing Risk Score: 0  Physical Activity: Sufficiently Active   Days of Exercise per Week: 7 days   Minutes of Exercise per Session: 120 min  Social Connections: Moderately Isolated   Frequency of Communication with Friends and Family: Never   Frequency of Social Gatherings with Friends and Family: Once a week   Attends Religious Services: More than 4 times per year   Active Member of [employer] or Organizations: No   Attends Arts development officer: Never   Marital Status: Married  Stress: No Stress Concern Present   Feeling of Stress : Not at all  Tobacco Use: Medium Risk   Smoking Tobacco Use: Former   Smokeless Tobacco Use: Never   Passive Exposure: Not on Pensions consultant Needs: No Transportation Needs   Lack of Transportation (Medical): No   Lack of Transportation (Non-Medical): No    CCM Care Plan  Allergies  Allergen Reactions   Lactose Intolerance (Gi) Diarrhea   Sulfa Antibiotics Nausea And Vomiting and Swelling    Medications Reviewed Today     Reviewed by Starlyn Skeans (Physician Assistant) on 06/09/21 at [place] List Status: <None>   Medication Order Taking? Sig  Documenting Provider Last Dose Status Informant  furosemide (LASIX) 20 MG tablet [id number] Yes TAKE 1 TABLET(20 MG) BY MOUTH DAILY Vivi Barrack, MD Taking Active   hydrochlorothiazide (MICROZIDE) 12.5 MG capsule [id number] Yes TAKE 1 CAPSULE(12.5 MG) BY MOUTH DAILY Vivi Barrack, MD Taking Active   losartan (COZAAR) 50 MG tablet [id number] Yes TAKE 1 TABLET(50 MG) BY MOUTH DAILY Vivi Barrack, MD Taking Active   naproxen sodium (ALEVE) 220 MG tablet [id number] Yes Aleve 220 mg tablet   1 tablet every day by oral route. [provider] Taking Active   PREMPRO 0.45-1.5 MG tablet [id number] Yes TAKE 1 TABLET BY MOUTH DAILY Vivi Barrack, MD Taking Active   triamcinolone cream (KENALOG) 0.5 % [id number] Yes Mix 1:1 with lotion and apply twice daily for 1-2 weeks. 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     Medication Assistance:  Possible assistance needed with Prempro  Compliance/Adherence/Medication fill history: Care Gaps: Zoster Vaccine  Star-Rating Drugs: Losartan 50 mg once a day last filled 10/05/2020 90 DS  Patient's preferred pharmacy is:  [facility], [address] AT [address] [place] [address] Phone: [phone] Fax: [phone]   Uses pill box? Yes Pt endorses 100% compliance  We discussed: Current pharmacy is preferred with insurance plan and patient is satisfied with pharmacy services Patient decided to: Continue current medication management strategy  Care Plan and Follow Up Patient Decision:  Patient agrees to Care Plan and Follow-up.  Plan: The care management team will reach out to the patient again over the next 180 days.   Current Barriers:  None at this time  Pharmacist Clinical Goal(s):  Patient will verbalize ability to afford treatment regimen maintain control of BP as evidenced by home monitoring  contact provider office for questions/concerns as evidenced notation of same in electronic health record through collaboration with PharmD and provider.   Interventions: 1:1 collaboration with Vivi Barrack, MD regarding development and update of comprehensive plan of care as evidenced by provider attestation  and co-signature Inter-disciplinary care team collaboration (see longitudinal plan of care) Comprehensive medication review performed; medication list updated in electronic medical record  Hypertension  (Status:Goal on track: YES.)   Med Management Intervention: None  (BP goal <140/90) -Controlled -Current treatment: Losartan 100mg  daily HCTZ 12.5mg  daily -Medications previously tried: none noted  -Current home readings: no logs today, but she reports "normal"  -Current exercise habits: patient is a Human resources officer, 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 -Denies hypotensive/hypertensive symptoms -Educated on BP goals and benefits of medications for prevention of heart attack, stroke and kidney damage; Exercise goal of 150 minutes per week; Importance of home blood pressure monitoring; Symptoms of hypotension and importance of maintaining adequate hydration; -Counseled to monitor BP at home a few times per week, document, and provide log at future appointments -Recommended to continue current medication Recommended to continue to monitor at home  Edema (Goal: Minimize symptoms) -Controlled -Current treatment  Furosemide $RemoveBef'20mg'NJOYdpwIzE$  daily -Medications previously tried: none noted -Denies any recent swelling, she is only taking this on prn basis - when she does take it she takes it in the morning.  -Recommended to continue current medication Counseled on elevating feet whenever possible  Osteoarthritis (Goal: Minimize symptoms) -Controlled -Current treatment  Naproxen $RemoveB'220mg'dJfLVNfp$  daily -Medications previously tried: none noted -Has to take post shoulder surgery from years prior.  Kidney function remains stable with long term NSAID use.  -Recommended to continue current medication Continue to monitor kidney function.  Hot flashes (Goal: Minimize symptoms) -Controlled -Current treatment  Prempro 0.4-1.5 - patient is taking every two  or three days -Medications previously tried: none noted -She reports high copay, will research to see if there is any assistance with these medications -Counseled on risks of long term HRT - patient aware and wants to continue to take  -Recommended to continue current medication  Patient Goals/Self-Care Activities Patient will:  - take medications as prescribed focus on medication adherence by pill box target a minimum of 150 minutes of moderate intensity exercise weekly  Follow Up Plan: The care management team will reach out to the patient again over the next 180 days.

## 2021-06-30 ENCOUNTER — Other Ambulatory Visit: Payer: Self-pay | Admitting: Family Medicine

## 2021-07-01 ENCOUNTER — Telehealth: Payer: Medicare Other

## 2021-07-03 ENCOUNTER — Other Ambulatory Visit: Payer: Self-pay

## 2021-07-03 ENCOUNTER — Ambulatory Visit (INDEPENDENT_AMBULATORY_CARE_PROVIDER_SITE_OTHER): Payer: Medicare Other

## 2021-07-03 VITALS — BP 138/74 | HR 78 | Temp 97.7°F | Wt 113.8 lb

## 2021-07-03 DIAGNOSIS — Z0000 Encounter for general adult medical examination without abnormal findings: Secondary | ICD-10-CM

## 2021-07-03 NOTE — Progress Notes (Signed)
Subjective:   Ellen [name] is a 81 y.o. female who presents for Medicare Annual (Subsequent) preventive examination.  Review of Systems     Cardiac Risk Factors include: advanced age (>24men, >38 women);hypertension     Objective:    Today's Vitals   07/03/21 1435  BP: 138/74  Pulse: 78  Temp: 97.7 F (36.5 C)  SpO2: 98%  Weight: 113 lb 12.8 oz (51.6 kg)   Body mass index is 19.53 kg/m.  Advanced Directives 07/03/2021 06/27/2020 05/15/2019 02/22/2019 01/10/2015 03/10/2014 07/09/2013  Does Patient Have a Medical Advance Directive? Yes Yes Yes Yes No No Patient does not have advance directive;Patient would not like information  Type of Scientist, forensic Power of [place];Living will - - -  Does patient want to make changes to medical advance directive? - - No - Patient declined - - - -  Copy of [place] in Chart? No - copy requested No - copy requested No - copy requested Yes - validated most recent copy scanned in chart (See row information) - - -  Would patient like information on creating a medical advance directive? - - - - No - patient declined information Yes - Educational materials given -  Pre-existing out of facility DNR order (yellow form or pink MOST form) - - - - - - No    Current Medications (verified) Outpatient Encounter Medications as of 07/03/2021  Medication Sig   furosemide (LASIX) 20 MG tablet TAKE 1 TABLET(20 MG) BY MOUTH DAILY   hydrochlorothiazide (MICROZIDE) 12.5 MG capsule TAKE 1 CAPSULE(12.5 MG) BY MOUTH DAILY   losartan (COZAAR) 50 MG tablet TAKE 1 TABLET(50 MG) BY MOUTH DAILY   naproxen sodium (ALEVE) 220 MG tablet Aleve 220 mg tablet   1 tablet every day by oral route.   PREMPRO 0.45-1.5 MG tablet TAKE 1 TABLET BY MOUTH DAILY   triamcinolone cream (KENALOG) 0.5 % Mix 1:1 with lotion and apply twice daily for 1-2 weeks.    No facility-administered encounter medications on file as of 07/03/2021.    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   Left Buccal Cheek (Keratoacanthoma)   Past Surgical History:  Procedure Laterality Date   BOWEL RESECTION  07/09/2013   Procedure: SMALL BOWEL RESECTION;  Surgeon: Earnstine Regal, MD;  Location: [facility];  Service: General;;   LAPAROTOMY N/A 07/09/2013   Procedure: EXPLORATORY LAPAROTOMY;  Surgeon: Earnstine Regal, MD;  Location: [facility];  Service: General;  Laterality: N/A;   Family History  Problem Relation Age of Onset   Diabetes Mother    Heart attack Mother    CAD Father    Heart attack Father    Breast cancer Neg Hx    Social History   Socioeconomic History   Marital status: Married    Spouse name: Not on file   Number of children: Not on file   Years of education: Not on file   Highest education level: Not on file  Occupational History   Occupation: Retired   Tobacco Use   Smoking status: Former    Types: Cigarettes   Smokeless tobacco: Never  Vaping Use   Vaping Use: Never used  Substance and Sexual Activity   Alcohol use: Yes  Alcohol/week: 7.0 standard drinks    Types: 7 Glasses of wine per week   Drug use: No   Sexual activity: Not Currently  Other Topics Concern   Not on file  Social History Narrative   Enjoys gardening    Social Determinants of Health   Financial Resource Strain: Low Risk    Difficulty of Paying Living Expenses: Not hard at all  Food Insecurity: No Food Insecurity   Worried About Charity fundraiser in the Last Year: Never true   [place] in the Last Year: Never true  Transportation Needs: No Transportation Needs   Lack of Transportation (Medical): No   Lack of Transportation (Non-Medical): No  Physical Activity: Sufficiently Active   Days of  Exercise per Week: 5 days   Minutes of Exercise per Session: 120 min  Stress: Stress Concern Present   Feeling of Stress : To some extent  Social Connections: Moderately Integrated   Frequency of Communication with Friends and Family: More than three times a week   Frequency of Social Gatherings with Friends and Family: More than three times a week   Attends Religious Services: More than 4 times per year   Active Member of [employer] or Organizations: No   Attends Music therapist: Never   Marital Status: Married    Tobacco Counseling Counseling given: Not Answered   Clinical Intake:  Pre-visit preparation completed: Yes  Pain : No/denies pain     BMI - recorded: 19.53 Nutritional Status: BMI of 19-24  Normal Nutritional Risks: None Diabetes: No  How often do you need to have someone help you when you read instructions, pamphlets, or other written materials from your doctor or pharmacy?: 1 - Never  Diabetic?no  Interpreter Needed?: No  Information entered by :: Charlott Rakes, LPN   Activities of Daily Living In your present state of health, do you have any difficulty performing the following activities: 07/03/2021  Hearing? N  Vision? N  Difficulty concentrating or making decisions? N  Walking or climbing stairs? N  Dressing or bathing? N  Doing errands, shopping? N  Preparing Food and eating ? N  Using the Toilet? N  In the past six months, have you accidently leaked urine? N  Do you have problems with loss of bowel control? N  Managing your Medications? N  Managing your Finances? N  Housekeeping or managing your Housekeeping? N  Some recent data might be hidden    Patient Care Team: Vivi Barrack, MD as PCP - General (Family Medicine) Willette Pa Ronalee Red, PA-C as Physician Assistant (Dermatology) Edythe Clarity, [facility] as Pharmacist (Pharmacist)  Indicate any recent Medical Services you may have received from other than [facility] providers in the  past year (date may be approximate).     Assessment:   This is a routine wellness examination for Ellen [name].  Hearing/Vision screen Hearing Screening - Comments:: Pt denies any hearing issues Vision Screening - Comments:: Pt follows up with Dr Carmel [place] for annual eye exams   Dietary issues and exercise activities discussed: Current Exercise Habits: Home exercise routine, Type of exercise: Other - see comments (yard work as a job), Time (Minutes): > 60, Frequency (Times/Week): 5, Weekly Exercise (Minutes/Week): 0   Goals Addressed             This Visit's Progress    Patient Stated       Stay healthy & active        Depression Screen  PHQ 2/9 Scores 07/03/2021 03/07/2021 01/01/2021 06/27/2020 05/15/2019 07/25/2018 10/05/2017  PHQ - 2 Score 0 0 0 0 0 0 0    Fall Risk Fall Risk  07/03/2021 03/07/2021 01/01/2021 06/27/2020 05/15/2019  Falls in the past year? 0 0 0 0 0  Number falls in past yr: 0 0 0 0 -  Injury with Fall? 0 0 0 0 0  Risk for fall due to : Impaired vision;Impaired balance/gait No Fall Risks No Fall Risks Impaired vision -  Follow up Falls prevention discussed - - Falls prevention discussed Falls evaluation completed;Education provided;Falls prevention discussed    FALL RISK PREVENTION PERTAINING TO THE HOME:  Any stairs in or around the home? Yes  If so, are there any without handrails? No  Home free of loose throw rugs in walkways, pet beds, electrical cords, etc? Yes  Adequate lighting in your home to reduce risk of falls? Yes   ASSISTIVE DEVICES UTILIZED TO PREVENT FALLS:  Life alert? No  Use of a cane, walker or w/c? No  Grab bars in the bathroom? Yes  Shower chair or bench in shower? Yes  Elevated toilet seat or a handicapped toilet? No   TIMED UP AND GO:  Was the test performed? Yes .  Length of time to ambulate 10 feet: 10 sec.   Gait steady and fast without use of assistive device  Cognitive Function:     6CIT Screen 07/03/2021 06/27/2020   What Year? 0 points 0 points  What month? 0 points 0 points  What time? 0 points -  Count back from 20 0 points 0 points  Months in reverse 0 points 0 points  Repeat phrase 2 points 0 points  Total Score 2 -    Immunizations Immunization History  Administered Date(s) Administered   Fluad Quad(high Dose 65+) 02/17/2019, 06/27/2020, 02/19/2021   Influenza, High Dose Seasonal PF 07/25/2018   PFIZER(Purple Top)SARS-COV-2 Vaccination 07/28/2019, 08/18/2019, 04/22/2020   Tdap 11/04/2012    TDAP status: Up to date  Flu Vaccine status: Up to date  Pneumococcal vaccine status: Due, Education has been provided regarding the importance of this vaccine. Advised may receive this vaccine at local pharmacy or Health Dept. Aware to provide a copy of the vaccination record if obtained from local pharmacy or Health Dept. Verbalized acceptance and understanding.  Covid-19 vaccine status: Completed vaccines  Qualifies for Shingles Vaccine? Yes   Zostavax completed No   Shingrix Completed?: No.    Education has been provided regarding the importance of this vaccine. Patient has been advised to call insurance company to determine out of pocket expense if they have not yet received this vaccine. Advised may also receive vaccine at local pharmacy or Health Dept. Verbalized acceptance and understanding.  Screening Tests Health Maintenance  Topic Date Due   Zoster Vaccines- Shingrix (1 of 2) Never done   Pneumonia Vaccine 89+ Years old (1 - PCV) Never done   COVID-19 Vaccine (4 - Booster for Pfizer series) 06/17/2020   MAMMOGRAM  10/08/2021   TETANUS/TDAP  11/05/2022   INFLUENZA VACCINE  Completed   DEXA SCAN  Completed   HPV VACCINES  Aged Out    Health Maintenance  Health Maintenance Due  Topic Date Due   Zoster Vaccines- Shingrix (1 of 2) Never done   Pneumonia Vaccine 57+ Years old (1 - PCV) Never done   COVID-19 Vaccine (4 - Booster for [place] series) 06/17/2020    Colorectal cancer  screening: No longer required.   Mammogram  status: Completed 10/08/20. Repeat every year  Bone Density status: Completed 08/08/19. Results reflect: Bone density results: NORMAL. Repeat every 2 years.   Additional Screening:   Vision Screening: Recommended annual ophthalmology exams for early detection of glaucoma and other disorders of the eye. Is the patient up to date with their annual eye exam?  Yes  Who is the provider or what is the name of the office in which the patient attends annual eye exams? Dr Jabier Mutton  If pt is not established with a provider, would they like to be referred to a provider to establish care? No .   Dental Screening: Recommended annual dental exams for proper oral hygiene  Community Resource Referral / Chronic Care Management: CRR required this visit?  No   CCM required this visit?  No      Plan:     I have personally reviewed and noted the following in the patients chart:   Medical and social history Use of alcohol, tobacco or illicit drugs  Current medications and supplements including opioid prescriptions.  Functional ability and status Nutritional status Physical activity Advanced directives List of other physicians Hospitalizations, surgeries, and ER visits in previous 12 months Vitals Screenings to include cognitive, depression, and falls Referrals and appointments  In addition, I have reviewed and discussed with patient certain preventive protocols, quality metrics, and best practice recommendations. A written personalized care plan for preventive services as well as general preventive health recommendations were provided to patient.     Willette Brace, LPN   07/01/863   Nurse Notes: None

## 2021-07-03 NOTE — Patient Instructions (Signed)
Ellen [name] , Thank you for taking time to come for your Medicare Wellness Visit. I appreciate your ongoing commitment to your health goals. Please review the following plan we discussed and let me know if I can assist you in the future.   Screening recommendations/referrals: Colonoscopy: no longer required  Mammogram: Done 10/08/20 repeat every year  Bone Density: Done 08/08/19 repeat every 2 years  Recommended yearly ophthalmology/optometry visit for glaucoma screening and checkup Recommended yearly dental visit for hygiene and checkup  Vaccinations: Influenza vaccine: Done 02/19/21 repeat every year  Pneumococcal vaccine: Due and discussed  Tdap vaccine: Done 11/04/12 repeat every 10 years  Shingles vaccine: Shingrix discussed. Please contact your pharmacy for coverage information.    Covid-19:Completed 3/12, 4/2, & 04/22/20  Advanced directives: Please bring a copy of your health care power of attorney and living will to the office at your convenience.  Conditions/risks identified: Stay healthy and active   Next appointment: Follow up in one year for your annual wellness visit    Preventive Care 65 Years and Older, Female Preventive care refers to lifestyle choices and visits with your health care provider that can promote health and wellness. What does preventive care include? A yearly physical exam. This is also called an annual well check. Dental exams once or twice a year. Routine eye exams. Ask your health care provider how often you should have your eyes checked. Personal lifestyle choices, including: Daily care of your teeth and gums. Regular physical activity. Eating a healthy diet. Avoiding tobacco and drug use. Limiting alcohol use. Practicing safe sex. Taking low-dose aspirin every day. Taking vitamin and mineral supplements as recommended by your health care provider. What happens during an annual well check? The services and screenings done by your health care  provider during your annual well check will depend on your age, overall health, lifestyle risk factors, and family history of disease. Counseling  Your health care provider may ask you questions about your: Alcohol use. Tobacco use. Drug use. Emotional well-being. Home and relationship well-being. Sexual activity. Eating habits. History of falls. Memory and ability to understand (cognition). Work and work Statistician. Reproductive health. Screening  You may have the following tests or measurements: Height, weight, and BMI. Blood pressure. Lipid and cholesterol levels. These may be checked every 5 years, or more frequently if you are over 12 years old. Skin check. Lung cancer screening. You may have this screening every year starting at age 31 if you have a 30-pack-year history of smoking and currently smoke or have quit within the past 15 years. Fecal occult blood test (FOBT) of the stool. You may have this test every year starting at age 1. Flexible sigmoidoscopy or colonoscopy. You may have a sigmoidoscopy every 5 years or a colonoscopy every 10 years starting at age 28. Hepatitis C blood test. Hepatitis B blood test. Sexually transmitted disease (STD) testing. Diabetes screening. This is done by checking your blood sugar (glucose) after you have not eaten for a while (fasting). You may have this done every 1-3 years. Bone density scan. This is done to screen for osteoporosis. You may have this done starting at age 30. Mammogram. This may be done every 1-2 years. Talk to your health care provider about how often you should have regular mammograms. Talk with your health care provider about your test results, treatment options, and if necessary, the need for more tests. Vaccines  Your health care provider may recommend certain vaccines, such as: Influenza vaccine. This is  recommended every year. Tetanus, diphtheria, and acellular pertussis (Tdap, Td) vaccine. You may need a Td  booster every 10 years. Zoster vaccine. You may need this after age 28. Pneumococcal 13-valent conjugate (PCV13) vaccine. One dose is recommended after age 5. Pneumococcal polysaccharide (PPSV23) vaccine. One dose is recommended after age 86. Talk to your health care provider about which screenings and vaccines you need and how often you need them. This information is not intended to replace advice given to you by your health care provider. Make sure you discuss any questions you have with your health care provider. Document Released: 05/31/2015 Document Revised: 01/22/2016 Document Reviewed: 03/05/2015 Elsevier Interactive Patient Education  2017 [place] Prevention in the Home Falls can cause injuries. They can happen to people of all ages. There are many things you can do to make your home safe and to help prevent falls. What can I do on the outside of my home? Regularly fix the edges of walkways and driveways and fix any cracks. Remove anything that might make you trip as you walk through a door, such as a raised step or threshold. Trim any bushes or trees on the path to your home. Use bright outdoor lighting. Clear any walking paths of anything that might make someone trip, such as rocks or tools. Regularly check to see if handrails are loose or broken. Make sure that both sides of any steps have handrails. Any raised decks and porches should have guardrails on the edges. Have any leaves, snow, or ice cleared regularly. Use sand or salt on walking paths during winter. Clean up any spills in your garage right away. This includes oil or grease spills. What can I do in the bathroom? Use night lights. Install grab bars by the toilet and in the tub and shower. Do not use towel bars as grab bars. Use non-skid mats or decals in the tub or shower. If you need to sit down in the shower, use a plastic, non-slip stool. Keep the floor dry. Clean up any water that spills on the floor  as soon as it happens. Remove soap buildup in the tub or shower regularly. Attach bath mats securely with double-sided non-slip rug tape. Do not have throw rugs and other things on the floor that can make you trip. What can I do in the bedroom? Use night lights. Make sure that you have a light by your bed that is easy to reach. Do not use any sheets or blankets that are too big for your bed. They should not hang down onto the floor. Have a firm chair that has side arms. You can use this for support while you get dressed. Do not have throw rugs and other things on the floor that can make you trip. What can I do in the kitchen? Clean up any spills right away. Avoid walking on wet floors. Keep items that you use a lot in easy-to-reach places. If you need to reach something above you, use a strong step stool that has a grab bar. Keep electrical cords out of the way. Do not use floor polish or wax that makes floors slippery. If you must use wax, use non-skid floor wax. Do not have throw rugs and other things on the floor that can make you trip. What can I do with my stairs? Do not leave any items on the stairs. Make sure that there are handrails on both sides of the stairs and use them. Fix handrails that are  broken or loose. Make sure that handrails are as long as the stairways. Check any carpeting to make sure that it is firmly attached to the stairs. Fix any carpet that is loose or worn. Avoid having throw rugs at the top or bottom of the stairs. If you do have throw rugs, attach them to the floor with carpet tape. Make sure that you have a light switch at the top of the stairs and the bottom of the stairs. If you do not have them, ask someone to add them for you. What else can I do to help prevent falls? Wear shoes that: Do not have high heels. Have rubber bottoms. Are comfortable and fit you well. Are closed at the toe. Do not wear sandals. If you use a stepladder: Make sure that it is  fully opened. Do not climb a closed stepladder. Make sure that both sides of the stepladder are locked into place. Ask someone to hold it for you, if possible. Clearly mark and make sure that you can see: Any grab bars or handrails. First and last steps. Where the edge of each step is. Use tools that help you move around (mobility aids) if they are needed. These include: Canes. Walkers. Scooters. Crutches. Turn on the lights when you go into a dark area. Replace any light bulbs as soon as they burn out. Set up your furniture so you have a clear path. Avoid moving your furniture around. If any of your floors are uneven, fix them. If there are any pets around you, be aware of where they are. Review your medicines with your doctor. Some medicines can make you feel dizzy. This can increase your chance of falling. Ask your doctor what other things that you can do to help prevent falls. This information is not intended to replace advice given to you by your health care provider. Make sure you discuss any questions you have with your health care provider. Document Released: 02/28/2009 Document Revised: 10/10/2015 Document Reviewed: 06/08/2014 Elsevier Interactive Patient Education  2017 [employer].

## 2021-07-18 ENCOUNTER — Other Ambulatory Visit: Payer: Self-pay | Admitting: Family Medicine

## 2021-09-11 ENCOUNTER — Ambulatory Visit (INDEPENDENT_AMBULATORY_CARE_PROVIDER_SITE_OTHER): Payer: Medicare Other | Admitting: Family Medicine

## 2021-09-11 ENCOUNTER — Other Ambulatory Visit: Payer: Self-pay | Admitting: Family Medicine

## 2021-09-11 VITALS — BP 135/77 | HR 70 | Temp 97.8°F | Ht 64.0 in | Wt 116.0 lb

## 2021-09-11 DIAGNOSIS — I10 Essential (primary) hypertension: Secondary | ICD-10-CM | POA: Diagnosis not present

## 2021-09-11 DIAGNOSIS — M2040 Other hammer toe(s) (acquired), unspecified foot: Secondary | ICD-10-CM | POA: Diagnosis not present

## 2021-09-11 DIAGNOSIS — M79605 Pain in left leg: Secondary | ICD-10-CM

## 2021-09-11 MED ORDER — MELOXICAM 15 MG PO TABS: 15.0000 mg | ORAL_TABLET | Freq: Every day | ORAL | 0 refills | Status: DC

## 2021-09-11 NOTE — Patient Instructions (Signed)
It was very nice to see you today! ? ?I think you probably have a hamstring strain.  Please take meloxicam 15 mg daily.  Do not take naproxen while you are on this.  We will refer you to see a physical therapist.  Let us know if not improving in the next 1 to 2 weeks. ? ?Take care, ?Dr Jerline Pain ? ?PLEASE NOTE: ? ?If you had any lab tests please let us know if you have not heard back within a few days. You may see your results on mychart before we have a chance to review them but we will give you a call once they are reviewed by [place]. If we ordered any referrals today, please let us know if you have not heard from their office within the next week.  ? ?Please try these tips to maintain a healthy lifestyle: ? ?Eat at least 3 REAL meals and 1-2 snacks per day.  Aim for no more than 5 hours between eating.  If you eat breakfast, please do so within one hour of getting up.  ? ?Each meal should contain half fruits/vegetables, one quarter protein, and one quarter carbs (no bigger than a computer mouse) ? ?Cut down on sweet beverages. This includes juice, soda, and sweet tea.  ? ?Drink at least 1 glass of water with each meal and aim for at least 8 glasses per day ? ?Exercise at least 150 minutes every week.   ?

## 2021-09-11 NOTE — Progress Notes (Signed)
? ?  Ellen [name] is a 81 y.o. female who presents today for an office visit. ? ?Assessment/Plan:  ?New/Acute Problems: ?Left Leg Pain ?Likely multifactorial.  She does have some underlying osteoarthritis which is contributing though most of her pain seems to be located to her hamstring . She has a fair amount of weakness with resisted hamstring testing today as well as pain with this maneuver.  She may have a mild hamstring strain or tear.  She also possibly has some underlying sciatica contributing as well given radiation of pain from her low back into her feet.  We will start with conservative management.  Start meloxicam 15 mg daily for next 1 to 2 weeks.  We will also place referral to physical therapy.  If not improving we will refer to PT or sports medicine.  We discussed reasons to return to care. ? ?Chronic Problems Addressed Today: ?Hammer toe ?We have referred her to see podiatry in the past.  She has not yet followed up with them.  Instructed her to call to schedule appointment.  We will place a new referral if needed. ? ?Hypertension ?At goal today on losartan-HCTZ 50-12 0.5 once daily. ? ? ?  ?Subjective:  ?HPI: ? ?Patient here with hip pain. Started about 2 months ago. She notes pain radiates down to her left leg. Pain is mostly located on the back thigh and calf. She is not sure what might be causing this. Symptoms have been same for the past few months. Seems to be having some gait and balance issue as well. She has tried exercising at home with no relief. No other  specifics treatment tried. No precipitating or aggravating  events. No bruising. No reported fall or injury. Pain is worse first thing in the morning and then improves throughout the day. ?   ?  ?Objective:  ?Physical Exam: ?BP 135/77   Pulse 70   Temp 97.8 ?F (36.6 ?C) (Temporal)   Ht '5\' 4"'$  (1.626 m)   Wt 116 lb (52.6 kg)   SpO2 99%   BMI 19.91 kg/m?   ?Gen: No acute distress, resting comfortably ?MSK: ?- Back: No  deformities.  Nontender to palpation.  Negative straight leg raise test. ?- Left leg: No deformities.  Neurovascular intact distally.  Pain elicited with resisted hamstring curl.  Slight weakness with resisted hip extension.  Left hip with decreased range of motion with internal and external rotation. ?- Right leg: No deformities.  Neurovascular intact distally.  Strength normal throughout. ?Neuro: Grossly normal, moves all extremities ?Psych: Normal affect and thought content ? ?   ? ? ?I,Savera Zaman,acting as a Education administrator for Dimas Chyle, MD.,have documented all relevant documentation on the behalf of Dimas Chyle, MD,as directed by  Dimas Chyle, MD while in the presence of Dimas Chyle, MD.  ? ?I, Dimas Chyle, MD, have reviewed all documentation for this visit. The documentation on 09/11/21 for the exam, diagnosis, procedures, and orders are all accurate and complete. ? ?Algis Greenhouse. Jerline Pain, MD ?09/11/2021 9:35 AM  ? ?

## 2021-09-11 NOTE — Assessment & Plan Note (Signed)
We have referred her to see podiatry in the past.  She has not yet followed up with them.  Instructed her to call to schedule appointment.  We will place a new referral if needed. ?

## 2021-09-11 NOTE — Assessment & Plan Note (Signed)
At goal today on losartan-HCTZ 50-12 0.5 once daily. ?

## 2021-09-22 ENCOUNTER — Ambulatory Visit (INDEPENDENT_AMBULATORY_CARE_PROVIDER_SITE_OTHER): Payer: Medicare Other | Admitting: Physical Therapy

## 2021-09-22 ENCOUNTER — Encounter: Payer: Self-pay | Admitting: Physical Therapy

## 2021-09-22 DIAGNOSIS — M25552 Pain in left hip: Secondary | ICD-10-CM | POA: Diagnosis not present

## 2021-09-22 DIAGNOSIS — M5416 Radiculopathy, lumbar region: Secondary | ICD-10-CM | POA: Diagnosis not present

## 2021-09-22 NOTE — Therapy (Signed)
?OUTPATIENT PHYSICAL THERAPY LOWER EXTREMITY EVALUATION ? ? ?Patient Name: Ellen [name] ?MRN: [id number] ?DOB:Sep 16, 1940, 81 y.o., female ?Today's Date: 09/22/2021 ? ? PT End of Session - 09/28/21 1545   ? ? Visit Number 1   ? Number of Visits 12   ? Date for PT Re-Evaluation 11/03/21   ? Authorization Type Medicare   ? PT Start Time 1303   ? PT Stop Time 7829   ? PT Time Calculation (min) 42 min   ? Activity Tolerance Patient tolerated treatment well   ? Behavior During Therapy [facility] for tasks assessed/performed   ? ?  ?  ? ?  ? ? ?Past Medical History:  ?Diagnosis Date  ? Hypertension   ? Kidney stones   ? 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  ? Left Buccal Cheek (Keratoacanthoma)  ? ?Past Surgical History:  ?Procedure Laterality Date  ? BOWEL RESECTION  07/09/2013  ? Procedure: SMALL BOWEL RESECTION;  Surgeon: Earnstine Regal, MD;  Location: [facility];  Service: General;;  ? LAPAROTOMY N/A 07/09/2013  ? Procedure: EXPLORATORY LAPAROTOMY;  Surgeon: Earnstine Regal, MD;  Location: [facility];  Service: General;  Laterality: N/A;  ? ?Patient Active Problem List  ? Diagnosis Date Noted  ? Ventral hernia 01/01/2021  ? Diverticulosis 01/05/2019  ? Dermatitis 01/05/2019  ? Systolic murmur 56/21/3086  ? 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 Hyperglycemia 10/05/2017  ? Leg edema 10/05/2017  ? Hammer toe 10/05/2017  ? Hot flashes due to menopause 10/05/2017  ? 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/10/2013 07/11/2013  ? Hypertension 07/09/2013  ? ? ?PCP: Dimas Chyle ? ?REFERRING PROVIDER: Dimas Chyle ? ?REFERRING DIAG: Hamstring pain, L leg pain ? ?THERAPY DIAG:  ?Pain in left hip ? ?Radiculopathy, lumbar region ? ?ONSET DATE: 2 months ago ? ?SUBJECTIVE:  ? ?SUBJECTIVE STATEMENT: ? ?Pt states L leg pain, for 2 months. No injury to report, but pt does lots of gardening. For herself, and grounds for a 7 acre church. States pain low back,  into butt, into L thigh, into ankle. No numbness /tingling.  ?No previous back pain. She has Most pain in morning, then around 9 am, feels very little pain.  ?Notes Hiatal hernia, bothersome, but no pain  ?Also has pain in R foot, has had hammer toe(2nd toe) and bump/calcification on R gr toe for quite some time. Now having ongoing pain at base of 1st gr toe. States callous or wart. ? ?PERTINENT HISTORY: ?Hiatal hernia, R foot pain, HTN ? ?PAIN:  ?Are you having pain? Yes: NPRS scale: 7/10 ?Pain location: L back into Leg,    ?Pain description: pain, radiaitng pain ?Aggravating factors: first thing in am (worse after increased activity the day prior) ?Relieving factors: activity/movement.  ? ?PRECAUTIONS: None ? ?WEIGHT BEARING RESTRICTIONS No ? ?FALLS:  ?Has patient fallen in last 6 months? No ? ?PLOF: Independent ? ?PATIENT GOALS Decreased pain in L back, hip, leg, R foot.  ? ? ?OBJECTIVE:  ? ? ?COGNITION: ? Overall cognitive status: Within functional limits for tasks assessed  ? ?PALPATION: ?Pain and tenderness in L low back, L SI, into L TFL, ITB, piriformis,  ?Pain at base of R great toe, appears to have plantars wart,  does have significant stiffness in bil gr toe flexion. Noted R 2nd toe hammer toe, with retraction and crossover in standing.  ? ?LE ROM: ?Lumbar: ROM: flexion: mod limitation, Ext: min/mod  limitation, SB: WFL ?Hip ROM: mild limitation for ER bil;  ? ? ?LE MMT: ? Hip flex/ext: 4-/5,  Hip abd: 4/5 ? ? ?TODAY'S TREATMENT: ?Ther ex: see below for HEP.  ?Manual: long leg distraction for lumbar pump ? ? ?PATIENT EDUCATION:  ?Education details: PT POC, Exam findings, HEP ?Person educated: Patient ?Education method: Explanation, Demonstration, Tactile cues, Verbal cues, and Handouts ?Education comprehension: verbalized understanding, returned demonstration, verbal cues required, tactile cues required, and needs further education ? ? ?HOME EXERCISE PROGRAM: ?Access Code: [email or web address] ?URL:  [email or web address] ?Date: 09/28/2021 ?Prepared by: Lyndee Hensen ? ? ?ASSESSMENT: ? ?CLINICAL IMPRESSION: ?Pt presents with primary complaint of increased pain in back and LE. She has increased tenderness and tightness in lumbar and glute region. She is getting most pain in AM, worse when she has done increased activity the day prior. Discussed not over doing standing/bending activity. She appears to have plantars wort on bottom of great toe, that is quite painful. Discussed getting appt with podiatry for attempt for removal. Pt states understanding. Pt with decreased ability for full functional activities, and will benefit from skilled PT to improve deficits and pain.   ? ? ?OBJECTIVE IMPAIRMENTS Abnormal gait, decreased activity tolerance, decreased balance, decreased knowledge of use of DME, decreased mobility, difficulty walking, decreased ROM, decreased strength, hypomobility, increased muscle spasms, impaired flexibility, improper body mechanics, and pain.  ? ?ACTIVITY LIMITATIONS cleaning, community activity, meal prep, occupation, yard work, and shopping.  ? ?PERSONAL FACTORS  none  are also affecting patient's functional outcome.  ? ? ?REHAB POTENTIAL: Good ? ?CLINICAL DECISION MAKING: Stable/uncomplicated ? ?EVALUATION COMPLEXITY: Low ? ? ?GOALS: ?Goals reviewed with patient? Yes ? ?SHORT TERM GOALS: Target date: 10/06/21 ? ?Pt to be independent with initial HEP ? ?Goal status: INITIAL ? ?2.  Pt to report decreased pain in back and LE to 4/10 in AM.  ? ?Goal status: INITIAL ? ? ? ? ?LONG TERM GOALS: Target date: 11/03/21 ? ?Pt to be independent with final HEP ? ?Goal status: INITIAL ? ?2.  Pt to report decreased pain in back and LE to 0-2/10 in AM.  ? ?Goal status: INITIAL ? ?3.  Pt to demo ability and optimal mechanics for bend/lift/squat to improve ability for gardening.  ? ?Goal status: INITIAL ? ?4.  Pt to report ability for standing/walking activity for at least 1 hr without pain  greater than 2/10.  ? ?Goal status: INITIAL ? ? ? ?PLAN: ?PT FREQUENCY: 1-2x/week ? ?PT DURATION: 6 weeks ? ?PLANNED INTERVENTIONS: Therapeutic exercises, Therapeutic activity, Neuromuscular re-education, Balance training, Gait training, Patient/Family education, Joint manipulation, Joint mobilization, Stair training, DME instructions, Dry Needling, Electrical stimulation, Spinal manipulation, Spinal mobilization, Cryotherapy, Moist heat, Taping, Traction, Ultrasound, Ionotophoresis '4mg'$ /ml Dexamethasone, and Manual therapy ? ?PLAN FOR NEXT SESSION:  ? ?Lyndee Hensen, PT, DPT ?3:52 PM  09/28/21 ? ? ? ?

## 2021-09-28 ENCOUNTER — Encounter: Payer: Self-pay | Admitting: Physical Therapy

## 2021-09-29 ENCOUNTER — Ambulatory Visit (INDEPENDENT_AMBULATORY_CARE_PROVIDER_SITE_OTHER): Payer: Medicare Other | Admitting: Physical Therapy

## 2021-09-29 ENCOUNTER — Encounter: Payer: Self-pay | Admitting: Physical Therapy

## 2021-09-29 DIAGNOSIS — M5416 Radiculopathy, lumbar region: Secondary | ICD-10-CM

## 2021-09-29 DIAGNOSIS — M25552 Pain in left hip: Secondary | ICD-10-CM | POA: Diagnosis not present

## 2021-09-29 NOTE — Therapy (Signed)
?OUTPATIENT PHYSICAL THERAPY LOWER EXTREMITY TREATMENT  ? ? ?Patient Name: Ellen [name] ?MRN: [id number] ?DOB:November 30, 1940, 81 y.o., female ?Today's Date: 09/29/2021 ? ? PT End of Session - 09/29/21 0845   ? ? Visit Number 2   ? Number of Visits 12   ? Date for PT Re-Evaluation 11/03/21   ? Authorization Type Medicare   ? PT Start Time [phone]   ? PT Stop Time [phone]   ? PT Time Calculation (min) 42 min   ? Activity Tolerance Patient tolerated treatment well   ? Behavior During Therapy [facility] for tasks assessed/performed   ? ?  ?  ? ?  ? ? ?Past Medical History:  ?Diagnosis Date  ? Hypertension   ? Kidney stones   ? 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  ? Left Buccal Cheek (Keratoacanthoma)  ? ?Past Surgical History:  ?Procedure Laterality Date  ? BOWEL RESECTION  07/09/2013  ? Procedure: SMALL BOWEL RESECTION;  Surgeon: Earnstine Regal, MD;  Location: [facility];  Service: General;;  ? LAPAROTOMY N/A 07/09/2013  ? Procedure: EXPLORATORY LAPAROTOMY;  Surgeon: Earnstine Regal, MD;  Location: [facility];  Service: General;  Laterality: N/A;  ? ?Patient Active Problem List  ? Diagnosis Date Noted  ? Ventral hernia 01/01/2021  ? Diverticulosis 01/05/2019  ? Dermatitis 01/05/2019  ? Systolic murmur 31/51/7616  ? 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 Hyperglycemia 10/05/2017  ? Leg edema 10/05/2017  ? Hammer toe 10/05/2017  ? Hot flashes due to menopause 10/05/2017  ? 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/10/2013 07/11/2013  ? Hypertension 07/09/2013  ? ? ?PCP: Dimas Chyle ? ?REFERRING PROVIDER: Dimas Chyle ? ?REFERRING DIAG: Hamstring pain, L leg pain ? ?THERAPY DIAG:  ?Pain in left hip ? ?Radiculopathy, lumbar region ? ?ONSET DATE: 2 months ago ? ?SUBJECTIVE:  ? ?SUBJECTIVE STATEMENT: ? ?09/29/2021 ?Pt still with pain in lateral hip, in AM, better by 9:30 or so. Has been doing HEP.  ? ?Pt states L leg pain, for 2 months. No injury to  report, but pt does lots of gardening. For herself, and grounds for a 7 acre church. States pain low back, into butt, into L thigh, into ankle. No numbness /tingling.  ?No previous back pain. She has Most pain in morning, then around 9 am, feels very little pain.  ?Notes Hiatal hernia, bothersome, but no pain  ?Also has pain in R foot, has had hammer toe(2nd toe) and bump/calcification on R gr toe for quite some time. Now having ongoing pain at base of 1st gr toe. States callous or wart. ? ?PERTINENT HISTORY: ?Hiatal hernia, R foot pain, HTN ? ?PAIN:  ?Are you having pain? Yes: NPRS scale: 7/10 ?Pain location: L back into Leg,    ?Pain description: pain, radiaitng pain ?Aggravating factors: first thing in am (worse after increased activity the day prior) ?Relieving factors: activity/movement.  ? ?PRECAUTIONS: None ? ?WEIGHT BEARING RESTRICTIONS No ? ?FALLS:  ?Has patient fallen in last 6 months? No ? ?PLOF: Independent ? ?PATIENT GOALS Decreased pain in L back, hip, leg, R foot.  ? ? ?OBJECTIVE:  ? ? ?COGNITION: ? Overall cognitive status: Within functional limits for tasks assessed  ? ?PALPATION: ?Pain and tenderness in L low back, L SI, into L TFL, ITB, piriformis,  ?Pain at base of R great toe, appears to have plantars wart,  does have significant stiffness in bil gr toe flexion. Noted  R 2nd toe hammer toe, with retraction and crossover in standing.  ? ?LE ROM: ?Lumbar: ROM: flexion: mod limitation, Ext: min/mod limitation, SB: WFL ?Hip ROM: mild limitation for ER bil;  ? ? ?LE MMT: ? Hip flex/ext: 4-/5,  Hip abd: 4/5 ? ? ?TODAY'S TREATMENT: ? ?Therapeutic Exercise: ?Aerobic: ?Supine: Clams Blue TB x 20;   ?Seated: ?Standing: ?Stretches: fig 4 piriformis 30 sec x 2 bil;  Seated: gr toe ROM, education for HEP.  ?Neuromuscular Re-education: ?Manual Therapy:  DTM to L glute musculature, posterior to Gr troch. Roller to ITB. Long leg distraction for hip and lumbar pump on L; Bil ankle mobs to inc DF and EV ; mobs to  gr toe for inc flex and ext;  ?Self Care: ?Modalities:  Ionto, dexamethasone , 4 hr patch to L gr troch., instructions on use, precautions and wear time.  ? ? ? ?PATIENT EDUCATION:  ?Education details:  reviewed HEP ?Person educated: Patient ?Education method: Explanation, Demonstration, Tactile cues, Verbal cues, and Handouts ?Education comprehension: verbalized understanding, returned demonstration, verbal cues required, tactile cues required, and needs further education ? ? ?HOME EXERCISE PROGRAM: ?Access Code: [email or web address] ? ? ?ASSESSMENT: ? ?CLINICAL IMPRESSION: ?09/29/2021 ?Pt with most pain in gr troch today. Addressed with manual muscle tissue release in glute and ionto patch for pain. Mobilizations for gr toe done, as well as education for home ROM/mobilization. Pt will call podiatry for pain on bottom of foot. Plan to progress ther ex as able, and continue manual for pain.  ? ?Eval: Pt presents with primary complaint of increased pain in back and LE. She has increased tenderness and tightness in lumbar and glute region. She is getting most pain in AM, worse when she has done increased activity the day prior. Discussed not over doing standing/bending activity. She appears to have plantars wort on bottom of great toe, that is quite painful. Discussed getting appt with podiatry for attempt for removal. Pt states understanding. Pt with decreased ability for full functional activities, and will benefit from skilled PT to improve deficits and pain.   ? ? ?OBJECTIVE IMPAIRMENTS Abnormal gait, decreased activity tolerance, decreased balance, decreased knowledge of use of DME, decreased mobility, difficulty walking, decreased ROM, decreased strength, hypomobility, increased muscle spasms, impaired flexibility, improper body mechanics, and pain.  ? ?ACTIVITY LIMITATIONS cleaning, community activity, meal prep, occupation, yard work, and shopping.  ? ?PERSONAL FACTORS  none  are also affecting patient's functional  outcome.  ? ? ?REHAB POTENTIAL: Good ? ?CLINICAL DECISION MAKING: Stable/uncomplicated ? ?EVALUATION COMPLEXITY: Low ? ? ?GOALS: ?Goals reviewed with patient? Yes ? ?SHORT TERM GOALS: Target date: 10/06/21 ? ?Pt to be independent with initial HEP ? ?Goal status: INITIAL ? ?2.  Pt to report decreased pain in back and LE to 4/10 in AM.  ? ?Goal status: INITIAL ? ? ? ? ?LONG TERM GOALS: Target date: 11/03/21 ? ?Pt to be independent with final HEP ? ?Goal status: INITIAL ? ?2.  Pt to report decreased pain in back and LE to 0-2/10 in AM.  ? ?Goal status: INITIAL ? ?3.  Pt to demo ability and optimal mechanics for bend/lift/squat to improve ability for gardening.  ? ?Goal status: INITIAL ? ?4.  Pt to report ability for standing/walking activity for at least 1 hr without pain greater than 2/10.  ? ?Goal status: INITIAL ? ? ? ?PLAN: ?PT FREQUENCY: 1-2x/week ? ?PT DURATION: 6 weeks ? ?PLANNED INTERVENTIONS: Therapeutic exercises, Therapeutic activity, Neuromuscular re-education, Balance training, Gait training, Patient/Family  education, Joint manipulation, Joint mobilization, Stair training, DME instructions, Dry Needling, Electrical stimulation, Spinal manipulation, Spinal mobilization, Cryotherapy, Moist heat, Taping, Traction, Ultrasound, Ionotophoresis '4mg'$ /ml Dexamethasone, and Manual therapy ? ?PLAN FOR NEXT SESSION:  ? ?Lyndee Hensen, PT, DPT ?12:45 PM  09/29/21 ? ? ? ?

## 2021-09-30 ENCOUNTER — Other Ambulatory Visit: Payer: Self-pay | Admitting: Family Medicine

## 2021-09-30 DIAGNOSIS — Z1231 Encounter for screening mammogram for malignant neoplasm of breast: Secondary | ICD-10-CM

## 2021-10-01 ENCOUNTER — Encounter: Payer: Self-pay | Admitting: Physical Therapy

## 2021-10-01 ENCOUNTER — Ambulatory Visit (INDEPENDENT_AMBULATORY_CARE_PROVIDER_SITE_OTHER): Payer: Medicare Other | Admitting: Physical Therapy

## 2021-10-01 DIAGNOSIS — M5416 Radiculopathy, lumbar region: Secondary | ICD-10-CM | POA: Diagnosis not present

## 2021-10-01 DIAGNOSIS — M79605 Pain in left leg: Secondary | ICD-10-CM | POA: Diagnosis not present

## 2021-10-01 DIAGNOSIS — M25552 Pain in left hip: Secondary | ICD-10-CM

## 2021-10-01 NOTE — Therapy (Signed)
?OUTPATIENT PHYSICAL THERAPY LOWER EXTREMITY TREATMENT  ? ? ?Patient Name: Ellen [name] ?MRN: [id number] ?DOB:03-08-1941, 81 y.o., female ?Today's Date: 10/01/2021 ? ? ? PT End of Session - 10/01/21 1028   ? ? Visit Number 3   ? Number of Visits 12   ? Date for PT Re-Evaluation 11/03/21   ? Authorization Type Medicare   ? PT Start Time [phone]   ? PT Stop Time 1015   ? PT Time Calculation (min) 42 min   ? Activity Tolerance Patient tolerated treatment well   ? Behavior During Therapy [facility] for tasks assessed/performed   ? ?  ?  ? ?  ? ? ? ?Past Medical History:  ?Diagnosis Date  ? Hypertension   ? Kidney stones   ? 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  ? Left Buccal Cheek (Keratoacanthoma)  ? ?Past Surgical History:  ?Procedure Laterality Date  ? BOWEL RESECTION  07/09/2013  ? Procedure: SMALL BOWEL RESECTION;  Surgeon: Earnstine Regal, MD;  Location: [facility];  Service: General;;  ? LAPAROTOMY N/A 07/09/2013  ? Procedure: EXPLORATORY LAPAROTOMY;  Surgeon: Earnstine Regal, MD;  Location: [facility];  Service: General;  Laterality: N/A;  ? ?Patient Active Problem List  ? Diagnosis Date Noted  ? Ventral hernia 01/01/2021  ? Diverticulosis 01/05/2019  ? Dermatitis 01/05/2019  ? Systolic murmur 10/93/2355  ? 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 Hyperglycemia 10/05/2017  ? Leg edema 10/05/2017  ? Hammer toe 10/05/2017  ? Hot flashes due to menopause 10/05/2017  ? 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/10/2013 07/11/2013  ? Hypertension 07/09/2013  ? ? ?PCP: Dimas Chyle ? ?REFERRING PROVIDER: Dimas Chyle ? ?REFERRING DIAG: Hamstring pain, L leg pain ? ?THERAPY DIAG:  ?Pain in left hip ? ?Radiculopathy, lumbar region ? ?Left leg pain ? ?ONSET DATE: 2 months ago ? ?SUBJECTIVE:  ? ?SUBJECTIVE STATEMENT: ? ?10/01/2021 ?Pt reports increased pain in L lateral hip yesterday. Did do a lot of work the 2 days prior, but not out of the ordinary  for her.  ? ?Pt states L leg pain, for 2 months. No injury to report, but pt does lots of gardening. For herself, and grounds for a 7 acre church. States pain low back, into butt, into L thigh, into ankle. No numbness /tingling.  ?No previous back pain. She has Most pain in morning, then around 9 am, feels very little pain.  ?Notes Hiatal hernia, bothersome, but no pain  ?Also has pain in R foot, has had hammer toe(2nd toe) and bump/calcification on R gr toe for quite some time. Now having ongoing pain at base of 1st gr toe. States callous or wart. ? ?PERTINENT HISTORY: ?Hiatal hernia, R foot pain, HTN ? ?PAIN:  ?Are you having pain? Yes: NPRS scale: 7/10 ?Pain location: L back into Leg,    ?Pain description: pain, radiaitng pain ?Aggravating factors: first thing in am (worse after increased activity the day prior) ?Relieving factors: activity/movement.  ? ?PRECAUTIONS: None ? ?WEIGHT BEARING RESTRICTIONS No ? ?FALLS:  ?Has patient fallen in last 6 months? No ? ?PLOF: Independent ? ?PATIENT GOALS Decreased pain in L back, hip, leg, R foot.  ? ? ?OBJECTIVE:  ? ? ?COGNITION: ? Overall cognitive status: Within functional limits for tasks assessed  ? ?PALPATION: ?Pain and tenderness in L low back, L SI, into L TFL, ITB, piriformis,  ?Pain at base of R great toe, appears  to have plantars wart,  does have significant stiffness in bil gr toe flexion. Noted R 2nd toe hammer toe, with retraction and crossover in standing.  ? ?LE ROM: ?Lumbar: ROM: flexion: mod limitation, Ext: min/mod limitation, SB: WFL ?Hip ROM: mild limitation for ER bil;  ? ? ?LE MMT: ? Hip flex/ext: 4-/5,  Hip abd: 4/5 ? ? ?TODAY'S TREATMENT: ? ?10/01/2021 ?Therapeutic Exercise: ?Aerobic: ?Supine: Clams Blue TB x 20;   ?S/L hip abd x 15 on L;  ?Seated: ?Standing: ?Stretches:  Seated fig 4 piriformis 30 sec x 2 bil;  Seated: gr toe ROM (flexion for toes) with education for HEP.  ?Neuromuscular Re-education: ?Manual Therapy:  DTM to L glute musculature,  posterior to Gr troch. Roller to ITB. Long leg distraction for hip and lumbar pump on L; Bil ankle mobs to inc DF and EV ; mobs to gr toe for inc flex and ext;  ?Self Care: ?Modalities:  Ionto, dexamethasone , 4 hr patch to L gr troch., instructions on use, precautions and wear time.  ? ? ?09/29/21 ?Therapeutic Exercise: ?Aerobic: ?Supine: Clams Blue TB x 20;   ?Seated: ?Standing: ?Stretches: fig 4 piriformis 30 sec x 2 bil;  Seated: gr toe ROM, education for HEP.  ?Neuromuscular Re-education: ?Manual Therapy:  DTM to L glute musculature, posterior to Gr troch. Roller to ITB. Long leg distraction for hip and lumbar pump on L; Bil ankle mobs to inc DF and EV ; mobs to gr toe for inc flex and ext;  ?Self Care: ?Modalities:  Ionto, dexamethasone , 4 hr patch to L gr troch., instructions on use, precautions and wear time.  ? ? ? ?PATIENT EDUCATION:  ?Education details:  reviewed HEP ?Person educated: Patient ?Education method: Explanation, Demonstration, Tactile cues, Verbal cues, and Handouts ?Education comprehension: verbalized understanding, returned demonstration, verbal cues required, tactile cues required, and needs further education ? ? ?HOME EXERCISE PROGRAM: ?Access Code: [email or web address] ? ? ?ASSESSMENT: ? ?CLINICAL IMPRESSION: ?10/01/2021 ?Pt  continues to have most pain in gr troch region, into proximal ITB and glute muscle. Tender with manual today. Added hip abd strength today, also added for HEP. Plan to progress strength as able, and continue manual for pain. Continued work on mobilization for R foot, to improve ability for gr toe flexion. Worked on activation for toe flexion in seated position, to decrease significant toe extensor over activity. Pt quite challenged with this due to stiffness, but will continue to work on with HEP. Updated and reviewed HEP. Discussed more seated rest breaks, and seated hip stretching more during the day.  ? ?Eval: Pt presents with primary complaint of increased pain in back and  LE. She has increased tenderness and tightness in lumbar and glute region. She is getting most pain in AM, worse when she has done increased activity the day prior. Discussed not over doing standing/bending activity. She appears to have plantars wort on bottom of great toe, that is quite painful. Discussed getting appt with podiatry for attempt for removal. Pt states understanding. Pt with decreased ability for full functional activities, and will benefit from skilled PT to improve deficits and pain.   ? ? ?OBJECTIVE IMPAIRMENTS Abnormal gait, decreased activity tolerance, decreased balance, decreased knowledge of use of DME, decreased mobility, difficulty walking, decreased ROM, decreased strength, hypomobility, increased muscle spasms, impaired flexibility, improper body mechanics, and pain.  ? ?ACTIVITY LIMITATIONS cleaning, community activity, meal prep, occupation, yard work, and shopping.  ? ?PERSONAL FACTORS  none  are also affecting patient's  functional outcome.  ? ? ?REHAB POTENTIAL: Good ? ?CLINICAL DECISION MAKING: Stable/uncomplicated ? ?EVALUATION COMPLEXITY: Low ? ? ?GOALS: ?Goals reviewed with patient? Yes ? ?SHORT TERM GOALS: Target date: 10/06/21 ? ?Pt to be independent with initial HEP ? ?Goal status: INITIAL ? ?2.  Pt to report decreased pain in back and LE to 4/10 in AM.  ? ?Goal status: INITIAL ? ? ? ? ?LONG TERM GOALS: Target date: 11/03/21 ? ?Pt to be independent with final HEP ? ?Goal status: INITIAL ? ?2.  Pt to report decreased pain in back and LE to 0-2/10 in AM.  ? ?Goal status: INITIAL ? ?3.  Pt to demo ability and optimal mechanics for bend/lift/squat to improve ability for gardening.  ? ?Goal status: INITIAL ? ?4.  Pt to report ability for standing/walking activity for at least 1 hr without pain greater than 2/10.  ? ?Goal status: INITIAL ? ? ? ?PLAN: ?PT FREQUENCY: 1-2x/week ? ?PT DURATION: 6 weeks ? ?PLANNED INTERVENTIONS: Therapeutic exercises, Therapeutic activity, Neuromuscular  re-education, Balance training, Gait training, Patient/Family education, Joint manipulation, Joint mobilization, Stair training, DME instructions, Dry Needling, Electrical stimulation, Spinal manipulation, Spi

## 2021-10-08 ENCOUNTER — Encounter: Payer: Self-pay | Admitting: Physical Therapy

## 2021-10-08 ENCOUNTER — Ambulatory Visit (INDEPENDENT_AMBULATORY_CARE_PROVIDER_SITE_OTHER): Payer: Medicare Other | Admitting: Physical Therapy

## 2021-10-08 DIAGNOSIS — M5416 Radiculopathy, lumbar region: Secondary | ICD-10-CM | POA: Diagnosis not present

## 2021-10-08 DIAGNOSIS — M25552 Pain in left hip: Secondary | ICD-10-CM

## 2021-10-08 DIAGNOSIS — M79605 Pain in left leg: Secondary | ICD-10-CM | POA: Diagnosis not present

## 2021-10-08 NOTE — Therapy (Signed)
OUTPATIENT PHYSICAL THERAPY LOWER EXTREMITY TREATMENT    Patient Name: Ellen [name] MRN: [id number] DOB:05-07-1941, 81 y.o., female Today's Date: 10/08/2021    PT End of Session - 10/08/21 1347     Visit Number 4    Number of Visits 12    Date for PT Re-Evaluation 11/03/21    Authorization Type Medicare    PT Start Time 7564    PT Stop Time 1426    PT Time Calculation (min) 38 min    Activity Tolerance Patient tolerated treatment well    Behavior During Therapy [facility] for tasks assessed/performed              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   Left Buccal Cheek (Keratoacanthoma)   Past Surgical History:  Procedure Laterality Date   BOWEL RESECTION  07/09/2013   Procedure: SMALL BOWEL RESECTION;  Surgeon: Earnstine Regal, MD;  Location: [facility];  Service: General;;   LAPAROTOMY N/A 07/09/2013   Procedure: EXPLORATORY LAPAROTOMY;  Surgeon: Earnstine Regal, MD;  Location: [facility];  Service: General;  Laterality: N/A;   Patient Active Problem List   Diagnosis Date Noted   Ventral hernia 01/01/2021   Diverticulosis 01/05/2019   Dermatitis 33/29/5188   Systolic murmur 41/66/0630   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   Hyperglycemia 10/05/2017   Leg edema 10/05/2017   Hammer toe 10/05/2017   Hot flashes due to menopause 10/05/2017   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 Patient would like to not do ionto today.  Pt states L leg pain, for 2 months. No injury to report, but pt does lots of gardening. For herself, and grounds for a 7 acre church. States pain low back, into butt, into L thigh, into ankle. No numbness /tingling.  No previous back pain. She has Most pain in morning, then around 9 am, feels very little pain.  Notes Hiatal hernia, bothersome, but no pain  Also has pain in R foot, has had hammer toe(2nd toe) and bump/calcification on R gr toe for quite some time. Now having ongoing pain at base of 1st gr toe. States callous or wart.  PERTINENT HISTORY: Hiatal hernia, R foot pain, HTN  PAIN:  Are you having pain? Yes: NPRS scale: 6/10 Pain location: L back into Leg,    Pain description: pain, radiaitng pain Aggravating factors: first thing in am (worse after increased activity the day prior) Relieving factors: activity/movement.   PRECAUTIONS: None  WEIGHT BEARING RESTRICTIONS No  FALLS:  Has patient fallen in last 6 months? No  PLOF: Independent  PATIENT GOALS Decreased pain in L back, hip, leg, R foot.    OBJECTIVE:    COGNITION:  Overall cognitive status: Within functional limits for tasks assessed   PALPATION: Pain and tenderness in L low  back, L SI, into L TFL, ITB, piriformis,  Pain at base of R great toe, appears to have plantars wart,  does have significant stiffness in bil gr toe flexion. Noted R 2nd toe hammer toe, with retraction and crossover in standing.   LE ROM: Lumbar: ROM: flexion: mod limitation, Ext: min/mod limitation, SB: WFL Hip ROM: mild limitation for ER bil;    LE MMT:  Hip flex/ext: 4-/5,  Hip abd: 4/5   TODAY'S TREATMENT: 10/08/2021 Therapeutic Exercise: Aerobic: Supine: bent knee fall outs 5 minutes, bent knee fall ins 5 minutes - reviewed HEP Seated: Standing: Stretches:  Seated fig 4  piriformis 30 sec x 2 bil;  Neuromuscular Re-education: Manual Therapy:  IASTM TO LEFT LE 24 MINUTES - with percussion gun Self Care:    09/29/21 Therapeutic Exercise: Aerobic: Supine: Clams Blue TB x 20;   Seated: Standing: Stretches: fig 4 piriformis 30 sec x 2 bil;  Seated: gr toe ROM, education for HEP.  Neuromuscular Re-education: Manual Therapy:  DTM to L glute musculature, posterior to Gr troch. Roller to ITB. Long leg distraction for hip and lumbar pump on L; Bil ankle mobs to inc DF and EV ; mobs to gr toe for inc flex and ext;  Self Care: Modalities:  Ionto, dexamethasone , 4 hr patch to L gr troch., instructions on use, precautions and wear time.     PATIENT EDUCATION:  Education details:  reviewed HEP, on ice/heat for pain management, on safe use of percussion gun Person educated: Patient Education method: Explanation, Demonstration, Tactile cues, Verbal cues, and Handouts Education comprehension: verbalized understanding, returned demonstration, verbal cues required, tactile cues required, and needs further education   HOME EXERCISE PROGRAM: Access Code: [email or web address]   ASSESSMENT:  CLINICAL IMPRESSION: 10/08/2021 Tolerated percussion gun well with 50% reduction in symptoms when used for vibration and STM. Educated patient in use of percussion gun and then performed stretches to help promote motion after use of percussion gun. Reduced pain end of session after stretches reporting no pain. Did not apply ionto patch secondary to patient request. Will continue with current POC.  Eval: Pt presents with primary complaint of increased pain in back and LE. She has increased tenderness and tightness in lumbar and glute region. She is getting most pain in AM, worse when she has done increased activity the day prior. Discussed not over doing standing/bending activity. She appears to have plantars wort on bottom of great toe, that is quite painful. Discussed getting appt with  podiatry for attempt for removal. Pt states understanding. Pt with decreased ability for full functional activities, and will benefit from skilled PT to improve deficits and pain.     OBJECTIVE IMPAIRMENTS Abnormal gait, decreased activity tolerance, decreased balance, decreased knowledge of use of DME, decreased mobility, difficulty walking, decreased ROM, decreased strength, hypomobility, increased muscle spasms, impaired flexibility, improper body mechanics, and pain.   ACTIVITY LIMITATIONS cleaning, community activity, meal prep, occupation, yard work, and shopping.   PERSONAL FACTORS  none  are also affecting patient's functional outcome.    REHAB POTENTIAL: Good  CLINICAL DECISION MAKING: Stable/uncomplicated  EVALUATION COMPLEXITY: Low   GOALS: Goals reviewed with patient? Yes  SHORT TERM GOALS: Target date: 10/06/21  Pt to be independent with initial HEP  Goal status: INITIAL  2.  Pt to report decreased pain in back and LE to 4/10 in AM.   Goal status: INITIAL     LONG TERM GOALS: Target date: 11/03/21  Pt to be independent with  final HEP  Goal status: INITIAL  2.  Pt to report decreased pain in back and LE to 0-2/10 in AM.   Goal status: INITIAL  3.  Pt to demo ability and optimal mechanics for bend/lift/squat to improve ability for gardening.   Goal status: INITIAL  4.  Pt to report ability for standing/walking activity for at least 1 hr without pain greater than 2/10.   Goal status: INITIAL    PLAN: PT FREQUENCY: 1-2x/week  PT DURATION: 6 weeks  PLANNED INTERVENTIONS: Therapeutic exercises, Therapeutic activity, Neuromuscular re-education, Balance training, Gait training, Patient/Family education, Joint manipulation, Joint mobilization, Stair training, DME instructions, Dry Needling, Electrical stimulation, Spinal manipulation, Spinal mobilization, Cryotherapy, Moist heat, Taping, Traction, Ultrasound, Ionotophoresis '4mg'$ /ml Dexamethasone, and Manual  therapy  PLAN FOR NEXT SESSION:  review s/l abd, add s/l clam, continue manual for hip/gr troch/bursitis pain, and toe mobility to improve flexion.   2:31 PM, 10/08/21 Jerene Pitch, DPT Physical Therapy with [facility]

## 2021-10-14 ENCOUNTER — Ambulatory Visit (INDEPENDENT_AMBULATORY_CARE_PROVIDER_SITE_OTHER): Payer: Medicare Other | Admitting: Physical Therapy

## 2021-10-14 ENCOUNTER — Encounter: Payer: Self-pay | Admitting: Physical Therapy

## 2021-10-14 DIAGNOSIS — M5416 Radiculopathy, lumbar region: Secondary | ICD-10-CM | POA: Diagnosis not present

## 2021-10-14 DIAGNOSIS — M25552 Pain in left hip: Secondary | ICD-10-CM | POA: Diagnosis not present

## 2021-10-14 NOTE — Therapy (Signed)
OUTPATIENT PHYSICAL THERAPY LOWER EXTREMITY TREATMENT    Patient Name: Ellen [name] MRN: [id number] DOB:Feb 23, 1941, 81 y.o., female Today's Date: 10/14/2021    PT End of Session - 10/14/21 1120     Visit Number 5    Number of Visits 12    Date for PT Re-Evaluation 11/03/21    Authorization Type Medicare    PT Start Time 0849    PT Stop Time 0929    PT Time Calculation (min) 40 min    Activity Tolerance Patient tolerated treatment well    Behavior During Therapy [facility] for tasks assessed/performed               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   Left Buccal Cheek (Keratoacanthoma)   Past Surgical History:  Procedure Laterality Date   BOWEL RESECTION  07/09/2013   Procedure: SMALL BOWEL RESECTION;  Surgeon: Earnstine Regal, MD;  Location: [facility];  Service: General;;   LAPAROTOMY N/A 07/09/2013   Procedure: EXPLORATORY LAPAROTOMY;  Surgeon: Earnstine Regal, MD;  Location: [facility];  Service: General;  Laterality: N/A;   Patient Active Problem List   Diagnosis Date Noted   Ventral hernia 01/01/2021   Diverticulosis 01/05/2019   Dermatitis 02/77/4128   Systolic murmur 78/67/6720   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   Hyperglycemia 10/05/2017   Leg edema 10/05/2017   Hammer toe 10/05/2017   Hot flashes due to menopause 10/05/2017   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 Notes minimal relief  with stretches and HEP. Did purchase a percussion gun which she has been using 2x/day, feels is helpful for pain relief.   Eval: Pt states L leg pain, for 2 months. No injury to report, but pt does lots of gardening. For herself, and grounds for a 7 acre church. States pain low back, into butt, into L thigh, into ankle. No numbness /tingling.  No previous back pain. She has Most pain in morning, then around 9 am, feels very little pain.  Notes Hiatal hernia, bothersome, but no pain  Also has pain in R foot, has had hammer toe(2nd toe) and bump/calcification on R gr toe for quite some time. Now having ongoing pain at base of 1st gr toe. States callous or wart.  PERTINENT HISTORY: Hiatal hernia, R foot pain, HTN  PAIN:  Are you having pain? Yes: NPRS scale: 6/10 Pain location: L back into Leg,    Pain description: pain, radiaitng pain Aggravating factors: first thing in am (worse after increased activity the day prior) Relieving factors: activity/movement.   PRECAUTIONS: None  WEIGHT BEARING RESTRICTIONS No  FALLS:  Has patient fallen in last 6 months? No  PLOF: Independent  PATIENT GOALS Decreased pain in L back, hip, leg, R foot.    OBJECTIVE:    COGNITION:  Overall cognitive status: Within functional limits for tasks assessed   PALPATION: Pain and tenderness in L  low back, L SI, into L TFL, ITB, piriformis,  Pain at base of R great toe, appears to have plantars wart,  does have significant stiffness in bil gr toe flexion. Noted R 2nd toe hammer toe, with retraction and crossover in standing.   LE ROM: Lumbar: ROM: flexion: mod limitation, Ext: min/mod limitation, SB: WFL Hip ROM: mild limitation for ER bil;    LE MMT:  Hip flex/ext: 4-/5,  Hip abd: 4/5   TODAY'S TREATMENT: 10/14/2021 Therapeutic Exercise: Aerobic: Supine: S/L Hip abd 2x10 on L, clams x 15 on L;  Seated: Standing: Stretches:  Seated HSS 30 sec x 4 on L with foot/leg rotation -slow;   Supine fig 4  piriformis 30 sec x 2 bil;  Neuromuscular Re-education: Manual Therapy:  IASTM to L hip, ITB, Hamstring- with percussion gun. STM to lateral hamstring; long leg distraction on L for hip;  L gr toe mobilizations for flexion.  Self Care:       PATIENT EDUCATION:  Education details:  reviewed HEP, reviewed use of percussion gun, and need to see podiatry for foot.  Person educated: Patient Education method: Explanation, Demonstration, Tactile cues, Verbal cues, and Handouts Education comprehension: verbalized understanding, returned demonstration, verbal cues required, tactile cues required, and needs further education   HOME EXERCISE PROGRAM: Access Code: [email or web address]   ASSESSMENT:  CLINICAL IMPRESSION: 10/14/2021 Pt with increased pain in lateral hamstring today, addressed with manual and added stretch for HEP for this. Discussed continued pain management at home with stretching and percussion gun as tolerated. Pt continues to have pain in lateral hip and leg, and will benefit from continued care.    Eval: Pt presents with primary complaint of increased pain in back and LE. She has increased tenderness and tightness in lumbar and glute region. She is getting most pain in AM, worse when she has done increased activity the day prior. Discussed not over doing standing/bending activity. She appears to have plantars wort on bottom of great toe, that is quite painful. Discussed getting appt with podiatry for attempt for removal. Pt states understanding. Pt with decreased ability for full functional activities, and will benefit from skilled PT to improve deficits and pain.     OBJECTIVE IMPAIRMENTS Abnormal gait, decreased activity tolerance, decreased balance, decreased knowledge of use of DME, decreased mobility, difficulty walking, decreased ROM, decreased strength, hypomobility, increased muscle spasms, impaired flexibility, improper body mechanics, and pain.   ACTIVITY LIMITATIONS cleaning,  community activity, meal prep, occupation, yard work, and shopping.   PERSONAL FACTORS  none  are also affecting patient's functional outcome.    REHAB POTENTIAL: Good  CLINICAL DECISION MAKING: Stable/uncomplicated  EVALUATION COMPLEXITY: Low   GOALS: Goals reviewed with patient? Yes  SHORT TERM GOALS: Target date: 10/06/21  Pt to be independent with initial HEP  Goal status: INITIAL  2.  Pt to report decreased pain in back and LE to 4/10 in AM.   Goal status: INITIAL     LONG TERM GOALS: Target date: 11/03/21  Pt to be independent with final HEP  Goal status: INITIAL  2.  Pt to report decreased pain in back and LE to 0-2/10 in AM.   Goal status: INITIAL  3.  Pt to demo ability and optimal mechanics for bend/lift/squat to improve ability for gardening.   Goal status: INITIAL  4.  Pt to report ability for standing/walking activity for at least 1 hr without pain greater than 2/10.   Goal status: INITIAL    PLAN:  PT FREQUENCY: 1-2x/week  PT DURATION: 6 weeks  PLANNED INTERVENTIONS: Therapeutic exercises, Therapeutic activity, Neuromuscular re-education, Balance training, Gait training, Patient/Family education, Joint manipulation, Joint mobilization, Stair training, DME instructions, Dry Needling, Electrical stimulation, Spinal manipulation, Spinal mobilization, Cryotherapy, Moist heat, Taping, Traction, Ultrasound, Ionotophoresis '4mg'$ /ml Dexamethasone, and Manual therapy  PLAN FOR NEXT SESSION:  review s/l abd, add s/l clam, continue manual for hip/gr troch/bursitis pain, and toe mobility to improve flexion.   Lyndee Hensen, PT, DPT 11:29 AM  10/14/21

## 2021-10-15 ENCOUNTER — Ambulatory Visit
Admission: RE | Admit: 2021-10-15 | Discharge: 2021-10-15 | Disposition: A | Discharge status: Home / Self Care | Payer: Medicare Other | Source: Ambulatory Visit | Attending: Family Medicine | Admitting: Family Medicine

## 2021-10-15 DIAGNOSIS — Z1231 Encounter for screening mammogram for malignant neoplasm of breast: Secondary | ICD-10-CM | POA: Diagnosis not present

## 2021-10-16 ENCOUNTER — Ambulatory Visit (INDEPENDENT_AMBULATORY_CARE_PROVIDER_SITE_OTHER): Payer: Medicare Other | Admitting: Physical Therapy

## 2021-10-16 ENCOUNTER — Encounter: Payer: Self-pay | Admitting: Physical Therapy

## 2021-10-16 DIAGNOSIS — M25552 Pain in left hip: Secondary | ICD-10-CM

## 2021-10-16 DIAGNOSIS — M5416 Radiculopathy, lumbar region: Secondary | ICD-10-CM | POA: Diagnosis not present

## 2021-10-16 NOTE — Therapy (Signed)
OUTPATIENT PHYSICAL THERAPY LOWER EXTREMITY TREATMENT    Patient Name: Ellen [name] MRN: [id number] DOB:[birth year], 81 y.o., female Today's Date: 10/16/2021    PT End of Session - 10/16/21 2147     Visit Number 6    Number of Visits 12    Date for PT Re-Evaluation 11/03/21    Authorization Type Medicare    PT Start Time 0845    PT Stop Time 0925    PT Time Calculation (min) 40 min    Activity Tolerance Patient tolerated treatment well    Behavior During Therapy [facility] for tasks assessed/performed                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   Left Buccal Cheek (Keratoacanthoma)   Past Surgical History:  Procedure Laterality Date   BOWEL RESECTION  07/09/2013   Procedure: SMALL BOWEL RESECTION;  Surgeon: Earnstine Regal, MD;  Location: [facility];  Service: General;;   LAPAROTOMY N/A 07/09/2013   Procedure: EXPLORATORY LAPAROTOMY;  Surgeon: Earnstine Regal, MD;  Location: [facility];  Service: General;  Laterality: N/A;   Patient Active Problem List   Diagnosis Date Noted   Ventral hernia 01/01/2021   Diverticulosis 01/05/2019   Dermatitis 73/53/2992   Systolic murmur 42/68/3419   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   Hyperglycemia 10/05/2017   Leg edema 10/05/2017   Hammer toe 10/05/2017   Hot flashes due to menopause 10/05/2017   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   Eval: Pt states L leg pain, for 2 months.  No injury to report, but pt does lots of gardening. For herself, and grounds for a 7 acre church. States pain low back, into butt, into L thigh, into ankle. No numbness /tingling.  No previous back pain. She has Most pain in morning, then around 9 am, feels very little pain.  Notes Hiatal hernia, bothersome, but no pain  Also has pain in R foot, has had hammer toe(2nd toe) and bump/calcification on R gr toe for quite some time. Now having ongoing pain at base of 1st gr toe. States callous or wart.  PERTINENT HISTORY: Hiatal hernia, R foot pain, HTN  PAIN:  Are you having pain? Yes: NPRS scale: 6/10 Pain location: L back into Leg,    Pain description: pain, radiaitng pain Aggravating factors: first thing in am (worse after increased activity the day prior) Relieving factors: activity/movement.   PRECAUTIONS: None  WEIGHT BEARING RESTRICTIONS No  FALLS:  Has patient fallen in last 6 months? No  PLOF: Independent  PATIENT GOALS Decreased pain in L back, hip, leg, R foot.    OBJECTIVE:    COGNITION:  Overall cognitive status: Within functional limits for tasks assessed   PALPATION: Pain and tenderness in L low back, L SI, into L TFL, ITB, piriformis,  Pain at base of R great toe, appears to have plantars wart,  does have significant stiffness in  bil gr toe flexion. Noted R 2nd toe hammer toe, with retraction and crossover in standing.   LE ROM: Lumbar: ROM: flexion: mod limitation, Ext: min/mod limitation, SB: WFL Hip ROM: mild limitation for ER bil;    LE MMT:  Hip flex/ext: 4-/5,  Hip abd: 4/5   TODAY'S TREATMENT: 10/16/2021 Therapeutic Exercise: Aerobic: Supine: S/L Hip abd 2x10 on L, clams x 15 on L;  Seated: Sit to stand x 10,  Standing: Hip abd x 10 bil; Step ups 6 in x 10 bil;  Stretches:  Seated HSS 30 sec x 4 on L with foot/leg rotation -slow;   Supine fig 4 piriformis 30 sec x 2 bil; Supine hip flexor stretch off side of table x 2 min bil;  Neuromuscular  Re-education: Manual Therapy: R ankle mobs for DF; R gr toe mobilizations for flexion. L hip inf mobs, post mobs, long leg distraction for hip and lumbar;  Self Care:       PATIENT EDUCATION:  Education details:  reviewed HEP,  Person educated: Patient Education method: Explanation, Demonstration, Tactile cues, Verbal cues, and Handouts Education comprehension: verbalized understanding, returned demonstration, verbal cues required, tactile cues required, and needs further education   HOME EXERCISE PROGRAM: Access Code: [email or web address]   ASSESSMENT:  CLINICAL IMPRESSION: 10/16/2021 Pt with some decrease in hamstring soreness today. Focus on more hip mobilization today, in attempts for pain relief. Discussed trying more pain relief techniques at night for decreasing pain in AM.  Pt with minimal pain with strengthening today, plan to progress as tolerated.    Eval: Pt presents with primary complaint of increased pain in back and LE. She has increased tenderness and tightness in lumbar and glute region. She is getting most pain in AM, worse when she has done increased activity the day prior. Discussed not over doing standing/bending activity. She appears to have plantars wort on bottom of great toe, that is quite painful. Discussed getting appt with podiatry for attempt for removal. Pt states understanding. Pt with decreased ability for full functional activities, and will benefit from skilled PT to improve deficits and pain.     OBJECTIVE IMPAIRMENTS Abnormal gait, decreased activity tolerance, decreased balance, decreased knowledge of use of DME, decreased mobility, difficulty walking, decreased ROM, decreased strength, hypomobility, increased muscle spasms, impaired flexibility, improper body mechanics, and pain.   ACTIVITY LIMITATIONS cleaning, community activity, meal prep, occupation, yard work, and shopping.   PERSONAL FACTORS  none  are also affecting patient's functional outcome.     REHAB POTENTIAL: Good  CLINICAL DECISION MAKING: Stable/uncomplicated  EVALUATION COMPLEXITY: Low   GOALS: Goals reviewed with patient? Yes  SHORT TERM GOALS: Target date: 10/06/21  Pt to be independent with initial HEP  Goal status: INITIAL  2.  Pt to report decreased pain in back and LE to 4/10 in AM.   Goal status: INITIAL     LONG TERM GOALS: Target date: 11/03/21  Pt to be independent with final HEP  Goal status: INITIAL  2.  Pt to report decreased pain in back and LE to 0-2/10 in AM.   Goal status: INITIAL  3.  Pt to demo ability and optimal mechanics for bend/lift/squat to improve ability for gardening.   Goal status: INITIAL  4.  Pt to report ability for standing/walking activity for at least 1 hr without pain greater than 2/10.   Goal status: INITIAL    PLAN: PT FREQUENCY: 1-2x/week  PT DURATION: 6 weeks  PLANNED INTERVENTIONS: Therapeutic exercises, Therapeutic activity,  Neuromuscular re-education, Balance training, Gait training, Patient/Family education, Joint manipulation, Joint mobilization, Stair training, DME instructions, Dry Needling, Electrical stimulation, Spinal manipulation, Spinal mobilization, Cryotherapy, Moist heat, Taping, Traction, Ultrasound, Ionotophoresis '4mg'$ /ml Dexamethasone, and Manual therapy  PLAN FOR NEXT SESSION:  review s/l abd, add s/l clam, continue manual for hip/gr troch/bursitis pain, and toe mobility to improve flexion.   Lyndee Hensen, PT, DPT 9:48 PM  10/16/21

## 2021-10-20 ENCOUNTER — Encounter: Payer: Self-pay | Admitting: Physical Therapy

## 2021-10-20 ENCOUNTER — Ambulatory Visit (INDEPENDENT_AMBULATORY_CARE_PROVIDER_SITE_OTHER): Payer: Medicare Other | Admitting: Physical Therapy

## 2021-10-20 DIAGNOSIS — M25552 Pain in left hip: Secondary | ICD-10-CM

## 2021-10-20 DIAGNOSIS — M5416 Radiculopathy, lumbar region: Secondary | ICD-10-CM

## 2021-10-20 NOTE — Therapy (Signed)
OUTPATIENT PHYSICAL THERAPY LOWER EXTREMITY TREATMENT    Patient Name: Ellen [name] MRN: [id number] DOB:[birth year], 81 y.o., female Today's Date: 10/20/2021    PT End of Session - 10/20/21 1035     Visit Number 7    Number of Visits 12    Date for PT Re-Evaluation 11/03/21    Authorization Type Medicare    PT Start Time [id number]    PT Stop Time 0930    PT Time Calculation (min) 39 min    Activity Tolerance Patient tolerated treatment well    Behavior During Therapy [facility] for tasks assessed/performed                 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   Left Buccal Cheek (Keratoacanthoma)   Past Surgical History:  Procedure Laterality Date   BOWEL RESECTION  07/09/2013   Procedure: SMALL BOWEL RESECTION;  Surgeon: Earnstine Regal, MD;  Location: [facility];  Service: General;;   LAPAROTOMY N/A 07/09/2013   Procedure: EXPLORATORY LAPAROTOMY;  Surgeon: Earnstine Regal, MD;  Location: [facility];  Service: General;  Laterality: N/A;   Patient Active Problem List   Diagnosis Date Noted   Ventral hernia 01/01/2021   Diverticulosis 01/05/2019   Dermatitis 34/28/7681   Systolic murmur 15/72/6203   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   Hyperglycemia 10/05/2017   Leg edema 10/05/2017   Hammer toe 10/05/2017   Hot flashes due to menopause 10/05/2017   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 Did do quite a bit of lifting this weekend, moving sod.    Eval: Pt states L leg pain, for 2 months. No injury to report, but pt does lots of gardening. For herself, and grounds for a 7 acre church. States pain low back, into butt, into L thigh, into ankle. No numbness /tingling.  No previous back pain. She has Most pain in morning, then around 9 am, feels very little pain.  Notes Hiatal hernia, bothersome, but no pain  Also has pain in R foot, has had hammer toe(2nd toe) and bump/calcification on R gr toe for quite some time. Now having ongoing pain at base of 1st gr toe. States callous or wart.  PERTINENT HISTORY: Hiatal hernia, R foot pain, HTN  PAIN:  Are you having pain? Yes: NPRS scale: 6/10 Pain location: L back into Leg,    Pain description: pain, radiaitng pain Aggravating factors: first thing in am (worse after increased activity the day prior) Relieving factors: activity/movement.   PRECAUTIONS: None  WEIGHT BEARING RESTRICTIONS No  FALLS:  Has patient fallen in last 6 months? No  PLOF: Independent  PATIENT GOALS Decreased pain in L back, hip, leg, R foot.    OBJECTIVE:    COGNITION:  Overall cognitive status: Within functional limits for tasks assessed   PALPATION: Pain and tenderness in L low back, L SI, into L TFL, ITB, piriformis,  Pain at base of R great toe, appears to have  plantars wart,  does have significant stiffness in bil gr toe flexion. Noted R 2nd toe hammer toe, with retraction and crossover in standing.   LE ROM: Lumbar: ROM: flexion: mod limitation, Ext: min/mod limitation, SB: WFL Hip ROM: mild limitation for ER bil;    LE MMT:  Hip flex/ext: 4-/5,  Hip abd: 4/5   TODAY'S TREATMENT: 10/20/2021 Therapeutic Exercise: Aerobic: Supine: S/L Hip abd 2x10 on L, clams x 15 on L;  Seated: Sit to stand x 10,  Standing:  Stretches:  Cat cow x 20;  Supine fig 4 piriformis 30 sec x 2 bil; Supine hip flexor stretch off side of table x 2 min bil; pelvic tilts x 20;  Neuromuscular Re-education: Manual  Therapy: R ankle mobs for DF; L hip inf mobs, post mobs, long leg distraction for hip and lumbar; Lumbar PA mobs    10/16/21: Therapeutic Exercise: Aerobic: Supine: S/L Hip abd 2x10 on L, clams x 15 on L;  Seated: Sit to stand x 10,  Standing: Hip abd x 10 bil; Step ups 6 in x 10 bil;  Stretches:  Seated HSS 30 sec x 4 on L with foot/leg rotation -slow;   Supine fig 4 piriformis 30 sec x 2 bil; Supine hip flexor stretch off side of table x 2 min bil;  Neuromuscular Re-education: Manual Therapy: R ankle mobs for DF; R gr toe mobilizations for flexion. L hip inf mobs, post mobs, long leg distraction for hip and lumbar;  Self Care:   PATIENT EDUCATION:  Education details:  reviewed HEP,  Person educated: Patient Education method: Explanation, Demonstration, Tactile cues, Verbal cues, and Handouts Education comprehension: verbalized understanding, returned demonstration, verbal cues required, tactile cues required, and needs further education   HOME EXERCISE PROGRAM: Access Code: [email or web address]   ASSESSMENT:  CLINICAL IMPRESSION: 10/20/2021 Pt with continued tenderness to palpate in hamstring and thigh. Pain and tenderness in lateral hip is improved today from previous visits. She has increased pain in LE with single leg loading in L LE. Reviewed continued mobility for lumbar spine today, and for HEP. Pt with likely LE radiculopathy. She will see MD tomorrow. Pt to benefit from continued care.    Eval: Pt presents with primary complaint of increased pain in back and LE. She has increased tenderness and tightness in lumbar and glute region. She is getting most pain in AM, worse when she has done increased activity the day prior. Discussed not over doing standing/bending activity. She appears to have plantars wort on bottom of great toe, that is quite painful. Discussed getting appt with podiatry for attempt for removal. Pt states understanding. Pt with decreased ability for full functional  activities, and will benefit from skilled PT to improve deficits and pain.     OBJECTIVE IMPAIRMENTS Abnormal gait, decreased activity tolerance, decreased balance, decreased knowledge of use of DME, decreased mobility, difficulty walking, decreased ROM, decreased strength, hypomobility, increased muscle spasms, impaired flexibility, improper body mechanics, and pain.   ACTIVITY LIMITATIONS cleaning, community activity, meal prep, occupation, yard work, and shopping.   PERSONAL FACTORS  none  are also affecting patient's functional outcome.    REHAB POTENTIAL: Good  CLINICAL DECISION MAKING: Stable/uncomplicated  EVALUATION COMPLEXITY: Low   GOALS: Goals reviewed with patient? Yes  SHORT TERM GOALS: Target date: 10/06/21  Pt to be independent with initial HEP  Goal status: INITIAL  2.  Pt to report decreased pain in back and LE to 4/10 in AM.   Goal status: INITIAL  LONG TERM GOALS: Target date: 11/03/21  Pt to be independent with final HEP  Goal status: INITIAL  2.  Pt to report decreased pain in back and LE to 0-2/10 in AM.   Goal status: INITIAL  3.  Pt to demo ability and optimal mechanics for bend/lift/squat to improve ability for gardening.   Goal status: INITIAL  4.  Pt to report ability for standing/walking activity for at least 1 hr without pain greater than 2/10.   Goal status: INITIAL    PLAN: PT FREQUENCY: 1-2x/week  PT DURATION: 6 weeks  PLANNED INTERVENTIONS: Therapeutic exercises, Therapeutic activity, Neuromuscular re-education, Balance training, Gait training, Patient/Family education, Joint manipulation, Joint mobilization, Stair training, DME instructions, Dry Needling, Electrical stimulation, Spinal manipulation, Spinal mobilization, Cryotherapy, Moist heat, Taping, Traction, Ultrasound, Ionotophoresis '4mg'$ /ml Dexamethasone, and Manual therapy  PLAN FOR NEXT SESSION:  continue lumbar mobility, decompression and traction for L LE pain.     Lyndee Hensen, PT, DPT 10:36 AM  10/20/21

## 2021-10-20 NOTE — Progress Notes (Unsigned)
    Ellen [name] Ellen [name] [name] Phone: [phone]   Assessment and Plan:     There are no diagnoses linked to this encounter.  ***   Pertinent previous records reviewed include ***   Follow Up: ***     Subjective:   I, Ellen [name], am serving as a Education administrator for Doctor [name]  Chief Complaint: left hip pain   HPI:   10/21/2021 Patient is a 81 year old female complaining of left hip pain. Patient states  Relevant Historical Information: ***  Additional pertinent review of systems negative.   Current Outpatient Medications:    furosemide (LASIX) 20 MG tablet, TAKE 1 TABLET(20 MG) BY MOUTH DAILY, Disp: 30 tablet, Rfl: 3   hydrochlorothiazide (MICROZIDE) 12.5 MG capsule, TAKE 1 CAPSULE(12.5 MG) BY MOUTH DAILY, Disp: 90 capsule, Rfl: 1   losartan (COZAAR) 50 MG tablet, TAKE 1 TABLET(50 MG) BY MOUTH DAILY, Disp: 90 tablet, Rfl: 1   meloxicam (MOBIC) 15 MG tablet, TAKE 1 TABLET(15 MG) BY MOUTH DAILY, Disp: 90 tablet, Rfl: 0   naproxen sodium (ALEVE) 220 MG tablet, Aleve 220 mg tablet   1 tablet every day by oral route., Disp: , Rfl:    PREMPRO 0.45-1.5 MG tablet, TAKE 1 TABLET BY MOUTH DAILY, Disp: 28 tablet, Rfl: 0   triamcinolone cream (KENALOG) 0.5 %, Mix 1:1 with lotion and apply twice daily for 1-2 weeks., Disp: 454 g, Rfl: 0   Objective:     There were no vitals filed for this visit.    There is no height or weight on file to calculate BMI.    Physical Exam:    ***   Electronically signed by:  Ellen [name] D.[name] 1:06 PM 10/20/21

## 2021-10-21 ENCOUNTER — Ambulatory Visit (INDEPENDENT_AMBULATORY_CARE_PROVIDER_SITE_OTHER): Payer: Medicare Other

## 2021-10-21 ENCOUNTER — Ambulatory Visit (INDEPENDENT_AMBULATORY_CARE_PROVIDER_SITE_OTHER): Payer: Medicare Other | Admitting: Sports Medicine

## 2021-10-21 VITALS — HR 81 | Ht 64.0 in | Wt 116.0 lb

## 2021-10-21 DIAGNOSIS — M25552 Pain in left hip: Secondary | ICD-10-CM

## 2021-10-21 DIAGNOSIS — M7632 Iliotibial band syndrome, left leg: Secondary | ICD-10-CM | POA: Diagnosis not present

## 2021-10-21 DIAGNOSIS — M7062 Trochanteric bursitis, left hip: Secondary | ICD-10-CM | POA: Diagnosis not present

## 2021-10-21 DIAGNOSIS — M1612 Unilateral primary osteoarthritis, left hip: Secondary | ICD-10-CM | POA: Diagnosis not present

## 2021-10-21 DIAGNOSIS — M545 Low back pain, unspecified: Secondary | ICD-10-CM | POA: Diagnosis not present

## 2021-10-21 NOTE — Patient Instructions (Addendum)
Good to see you  Tylenol [phone] mg 2-3 times a day for pain relief  Recommend stopping naproxen  Continue PT ask Pt to add IT band exercises 2-3 week follow up

## 2021-10-22 ENCOUNTER — Ambulatory Visit (INDEPENDENT_AMBULATORY_CARE_PROVIDER_SITE_OTHER): Payer: Medicare Other | Admitting: Physical Therapy

## 2021-10-22 ENCOUNTER — Encounter: Payer: Self-pay | Admitting: Physical Therapy

## 2021-10-22 DIAGNOSIS — M5416 Radiculopathy, lumbar region: Secondary | ICD-10-CM | POA: Diagnosis not present

## 2021-10-22 DIAGNOSIS — M25552 Pain in left hip: Secondary | ICD-10-CM | POA: Diagnosis not present

## 2021-10-22 NOTE — Therapy (Signed)
OUTPATIENT PHYSICAL THERAPY LOWER EXTREMITY TREATMENT    Patient Name: Ellen [name] MRN: [id number] DOB:May 26, 1940, 81 y.o., female Today's Date: 10/22/2021    PT End of Session - 10/22/21 0857     Visit Number 8    Number of Visits 12    Date for PT Re-Evaluation 11/03/21    Authorization Type Medicare    PT Start Time 0856    PT Stop Time 0935    PT Time Calculation (min) 39 min    Activity Tolerance Patient tolerated treatment well    Behavior During Therapy [facility] for tasks assessed/performed                 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   Left Buccal Cheek (Keratoacanthoma)   Past Surgical History:  Procedure Laterality Date   BOWEL RESECTION  07/09/2013   Procedure: SMALL BOWEL RESECTION;  Surgeon: Earnstine Regal, MD;  Location: [facility];  Service: General;;   LAPAROTOMY N/A 07/09/2013   Procedure: EXPLORATORY LAPAROTOMY;  Surgeon: Earnstine Regal, MD;  Location: [facility];  Service: General;  Laterality: N/A;   Patient Active Problem List   Diagnosis Date Noted   Ventral hernia 01/01/2021   Diverticulosis 01/05/2019   Dermatitis 05/26/3233   Systolic murmur 57/32/2025   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   Hyperglycemia 10/05/2017   Leg edema 10/05/2017   Hammer toe 10/05/2017   Hot flashes due to menopause 10/05/2017   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 Still sore this am in hip and LE.   Eval: Pt states L leg pain, for 2  months. No injury to report, but pt does lots of gardening. For herself, and grounds for a 7 acre church. States pain low back, into butt, into L thigh, into ankle. No numbness /tingling.  No previous back pain. She has Most pain in morning, then around 9 am, feels very little pain.  Notes Hiatal hernia, bothersome, but no pain  Also has pain in R foot, has had hammer toe(2nd toe) and bump/calcification on R gr toe for quite some time. Now having ongoing pain at base of 1st gr toe. States callous or wart.  PERTINENT HISTORY: Hiatal hernia, R foot pain, HTN  PAIN:  Are you having pain? Yes: NPRS scale: 6/10 Pain location: L back into Leg,    Pain description: pain, radiaitng pain Aggravating factors: first thing in am (worse after increased activity the day prior) Relieving factors: activity/movement.   PRECAUTIONS: None  WEIGHT BEARING RESTRICTIONS No  FALLS:  Has patient fallen in last 6 months? No  PLOF: Independent  PATIENT GOALS Decreased pain in L back, hip, leg, R foot.    OBJECTIVE:    COGNITION:  Overall cognitive status: Within functional limits for tasks assessed   PALPATION: Pain and tenderness in L low back, L SI, into L TFL, ITB, piriformis,  Pain at base of R great toe, appears to have plantars wart,  does have  significant stiffness in bil gr toe flexion. Noted R 2nd toe hammer toe, with retraction and crossover in standing.   LE ROM: Lumbar: ROM: flexion: mod limitation, Ext: min/mod limitation, SB: WFL Hip ROM: mild limitation for ER bil;    LE MMT:  Hip flex/ext: 4-/5,  Hip abd: 4/5   TODAY'S TREATMENT: 10/22/2021 Therapeutic Exercise: Aerobic: Supine:  clams x 15 on L;  Seated: Sit to stand x 10,  LAQ x 15 bil;  Standing:  Hip abd 2 x 10 bil;  Stretches:  Seated and Supine fig 4 piriformis 30 sec x 2 bil;  Quad stretch in s/l - manual; seated HSS 30 sec x 3 ;  Neuromuscular Re-education: Manual Therapy: STM/IASTM to L hip, ITB, lateral HS, and  quad, Manual HSS    10/16/21: Therapeutic Exercise: Aerobic: Supine: S/L Hip abd 2x10 on L, clams x 15 on L;  Seated: Sit to stand x 10,  Standing: Hip abd x 10 bil; Step ups 6 in x 10 bil;  Stretches:  Seated HSS 30 sec x 4 on L with foot/leg rotation -slow;   Supine fig 4 piriformis 30 sec x 2 bil; Supine hip flexor stretch off side of table x 2 min bil;  Neuromuscular Re-education: Manual Therapy: R ankle mobs for DF; R gr toe mobilizations for flexion. L hip inf mobs, post mobs, long leg distraction for hip and lumbar;  Self Care:   PATIENT EDUCATION:  Education details:  reviewed HEP,  Person educated: Patient Education method: Consulting civil engineer, Demonstration, Tactile cues, Verbal cues, and Handouts Education comprehension: verbalized understanding, returned demonstration, verbal cues required, tactile cues required, and needs further education   HOME EXERCISE PROGRAM: Access Code: [email or web address]   ASSESSMENT:  CLINICAL IMPRESSION: 10/22/2021 Pt with improving tenderness in gr troch, and in quad today. Still very tender in lateral hamstring. Will continue to assess effects of injection next week. Plan to continue manual for pain and progressive hip strengthening as tolerated.    Eval: Pt presents with primary complaint of increased pain in back and LE. She has increased tenderness and tightness in lumbar and glute region. She is getting most pain in AM, worse when she has done increased activity the day prior. Discussed not over doing standing/bending activity. She appears to have plantars wort on bottom of great toe, that is quite painful. Discussed getting appt with podiatry for attempt for removal. Pt states understanding. Pt with decreased ability for full functional activities, and will benefit from skilled PT to improve deficits and pain.     OBJECTIVE IMPAIRMENTS Abnormal gait, decreased activity tolerance, decreased balance, decreased knowledge of use of DME, decreased mobility,  difficulty walking, decreased ROM, decreased strength, hypomobility, increased muscle spasms, impaired flexibility, improper body mechanics, and pain.   ACTIVITY LIMITATIONS cleaning, community activity, meal prep, occupation, yard work, and shopping.   PERSONAL FACTORS  none  are also affecting patient's functional outcome.    REHAB POTENTIAL: Good  CLINICAL DECISION MAKING: Stable/uncomplicated  EVALUATION COMPLEXITY: Low   GOALS: Goals reviewed with patient? Yes  SHORT TERM GOALS: Target date: 10/06/21  Pt to be independent with initial HEP  Goal status: INITIAL  2.  Pt to report decreased pain in back and LE to 4/10 in AM.   Goal status: INITIAL     LONG TERM GOALS: Target date: 11/03/21  Pt to be independent with final HEP  Goal status: INITIAL  2.  Pt to report decreased pain in back and LE to 0-2/10 in AM.  Goal status: INITIAL  3.  Pt to demo ability and optimal mechanics for bend/lift/squat to improve ability for gardening.   Goal status: INITIAL  4.  Pt to report ability for standing/walking activity for at least 1 hr without pain greater than 2/10.   Goal status: INITIAL    PLAN: PT FREQUENCY: 1-2x/week  PT DURATION: 6 weeks  PLANNED INTERVENTIONS: Therapeutic exercises, Therapeutic activity, Neuromuscular re-education, Balance training, Gait training, Patient/Family education, Joint manipulation, Joint mobilization, Stair training, DME instructions, Dry Needling, Electrical stimulation, Spinal manipulation, Spinal mobilization, Cryotherapy, Moist heat, Taping, Traction, Ultrasound, Ionotophoresis '4mg'$ /ml Dexamethasone, and Manual therapy  PLAN FOR NEXT SESSION:  continue lumbar mobility, decompression and traction for L LE pain.    Lyndee Hensen, PT, DPT 6:10 PM  10/22/21

## 2021-10-27 ENCOUNTER — Encounter: Payer: Self-pay | Admitting: Physical Therapy

## 2021-10-27 ENCOUNTER — Ambulatory Visit (INDEPENDENT_AMBULATORY_CARE_PROVIDER_SITE_OTHER): Payer: Medicare Other | Admitting: Physical Therapy

## 2021-10-27 DIAGNOSIS — M5416 Radiculopathy, lumbar region: Secondary | ICD-10-CM

## 2021-10-27 DIAGNOSIS — M25552 Pain in left hip: Secondary | ICD-10-CM | POA: Diagnosis not present

## 2021-10-27 NOTE — Therapy (Signed)
OUTPATIENT PHYSICAL THERAPY LOWER EXTREMITY TREATMENT    Patient Name: Ellen [name] MRN: [id number] DOB:[birth year], 81 y.o., female Today's Date: 10/27/2021    PT End of Session - 10/27/21 0856     Visit Number 9    Number of Visits 12    Date for PT Re-Evaluation 11/03/21    Authorization Type Medicare    PT Start Time 0850    PT Stop Time 0930    PT Time Calculation (min) 40 min    Activity Tolerance Patient tolerated treatment well    Behavior During Therapy [facility] for tasks assessed/performed                 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   Left Buccal Cheek (Keratoacanthoma)   Past Surgical History:  Procedure Laterality Date   BOWEL RESECTION  07/09/2013   Procedure: SMALL BOWEL RESECTION;  Surgeon: Earnstine Regal, MD;  Location: [facility];  Service: General;;   LAPAROTOMY N/A 07/09/2013   Procedure: EXPLORATORY LAPAROTOMY;  Surgeon: Earnstine Regal, MD;  Location: [facility];  Service: General;  Laterality: N/A;   Patient Active Problem List   Diagnosis Date Noted   Ventral hernia 01/01/2021   Diverticulosis 01/05/2019   Dermatitis 40/98/1191   Systolic murmur 47/82/9562   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 Still most painful in AM. Pain in lateral quad, ITB, and lateral hamstring.   Eval: Pt  states L leg pain, for 2 months. No injury to report, but pt does lots of gardening. For herself, and grounds for a 7 acre church. States pain low back, into butt, into L thigh, into ankle. No numbness /tingling.  No previous back pain. She has Most pain in morning, then around 9 am, feels very little pain.  Notes Hiatal hernia, bothersome, but no pain  Also has pain in R foot, has had hammer toe(2nd toe) and bump/calcification on R gr toe for quite some time. Now having ongoing pain at base of 1st gr toe. States callous or wart.  PERTINENT HISTORY: Hiatal hernia, R foot pain, HTN  PAIN:  Are you having pain? Yes: NPRS scale: 6/10 Pain location: L back into Leg,    Pain description: pain, radiaitng pain Aggravating factors: first thing in am (worse after increased activity the day prior) Relieving factors: activity/movement.   PRECAUTIONS: None  WEIGHT BEARING RESTRICTIONS No  FALLS:  Has patient fallen in last 6 months? No  PLOF: Independent  PATIENT GOALS Decreased pain in L back, hip, leg, R foot.    OBJECTIVE:    COGNITION:  Overall cognitive status: Within functional limits for tasks assessed   PALPATION: Pain and tenderness in L low back, L SI, into L TFL, ITB, piriformis,  Pain at base of R great toe, appears to have plantars wart,  does have  significant stiffness in bil gr toe flexion. Noted R 2nd toe hammer toe, with retraction and crossover in standing.   LE ROM: Lumbar: ROM: flexion: mod limitation, Ext: min/mod limitation, SB: WFL Hip ROM: mild limitation for ER bil;    LE MMT:  Hip flex/ext: 4-/5,  Hip abd: 4/5   TODAY'S TREATMENT:  10/27/2021  Therapeutic Exercise: Aerobic:  Supine:  SLR x 10 on L;  Seated: Sit to stand x 10,  LAQ 2.5 lb x 15 bil;  Standing:  Hip abd 3 x 10 bil;  Stretches:  Seated fig 4 piriformis 30 sec x 2 bil;  Quad stretch in s/l - manual; seated HSS 30 sec x 3 ; Standing hip flexor stretch 30 sec x3 on L;  Neuromuscular  Re-education: Manual Therapy: Hip inf mobs with strap; Long leg distraction for hip and lumbar;  Manual HS and quad stretches.  Trigger Point Dry-Needling  Treatment instructions: Expect mild to moderate muscle soreness. S/S of pneumothorax if dry needled over a lung field, and to seek immediate medical attention should they occur. Patient verbalized understanding of these instructions and education.  Patient Consent Given: Yes Education handout provided: Yes Muscles treated: L vastus lateralis  Electrical stimulation performed: No Parameters: N/A Treatment response/outcome: twitch response, palpable increase in muscle length.     10/16/21: Therapeutic Exercise: Aerobic: Supine: S/L Hip abd 2x10 on L, clams x 15 on L;  Seated: Sit to stand x 10,  Standing: Hip abd x 10 bil; Step ups 6 in x 10 bil;  Stretches:  Seated HSS 30 sec x 4 on L with foot/leg rotation -slow;   Supine fig 4 piriformis 30 sec x 2 bil; Supine hip flexor stretch off side of table x 2 min bil;  Neuromuscular Re-education: Manual Therapy: R ankle mobs for DF; R gr toe mobilizations for flexion. L hip inf mobs, post mobs, long leg distraction for hip and lumbar;  Self Care:   PATIENT EDUCATION:  Education details:  reviewed HEP,  Person educated: Patient Education method: Explanation, Demonstration, Tactile cues, Verbal cues, and Handouts Education comprehension: verbalized understanding, returned demonstration, verbal cues required, tactile cues required, and needs further education   HOME EXERCISE PROGRAM: Access Code: [email or web address]   ASSESSMENT:  CLINICAL IMPRESSION: 10/27/2021 Pt with good ability for hip strengthening today, no increased pain with hip ther ex on mat. She has much tenderness in lateral quad and HS. Good twitch response with DN to quad today. She has most pain today with sit to stand, and standing activity, giving pain into L LE/thigh. Plan to progress as tolerated.   Eval: Pt presents with  primary complaint of increased pain in back and LE. She has increased tenderness and tightness in lumbar and glute region. She is getting most pain in AM, worse when she has done increased activity the day prior. Discussed not over doing standing/bending activity. She appears to have plantars wort on bottom of great toe, that is quite painful. Discussed getting appt with podiatry for attempt for removal. Pt states understanding. Pt with decreased ability for full functional activities, and will benefit from skilled PT to improve deficits and pain.     OBJECTIVE IMPAIRMENTS Abnormal gait, decreased activity tolerance, decreased balance, decreased knowledge of use of DME, decreased mobility, difficulty walking, decreased ROM, decreased strength, hypomobility, increased muscle spasms, impaired flexibility, improper body mechanics, and pain.   ACTIVITY LIMITATIONS cleaning, community activity, meal prep, occupation, yard work, and shopping.   PERSONAL FACTORS  none  are also affecting patient's functional outcome.    REHAB POTENTIAL: Good  CLINICAL DECISION MAKING: Stable/uncomplicated  EVALUATION COMPLEXITY: Low   GOALS: Goals reviewed with patient? Yes  SHORT TERM GOALS: Target date: 10/06/21  Pt to be independent with initial HEP  Goal status: INITIAL  2.  Pt to report decreased pain in back and LE to 4/10 in AM.   Goal status: INITIAL     LONG TERM GOALS: Target date: 11/03/21  Pt to be independent with final HEP  Goal status: INITIAL  2.  Pt to report decreased pain in back and LE to 0-2/10 in AM.   Goal status: INITIAL  3.  Pt to demo ability and optimal mechanics for bend/lift/squat to improve ability for gardening.   Goal status: INITIAL  4.  Pt to report ability for standing/walking activity for at least 1 hr without pain greater than 2/10.   Goal status: INITIAL    PLAN: PT FREQUENCY: 1-2x/week  PT DURATION: 6 weeks  PLANNED INTERVENTIONS: Therapeutic  exercises, Therapeutic activity, Neuromuscular re-education, Balance training, Gait training, Patient/Family education, Joint manipulation, Joint mobilization, Stair training, DME instructions, Dry Needling, Electrical stimulation, Spinal manipulation, Spinal mobilization, Cryotherapy, Moist heat, Taping, Traction, Ultrasound, Ionotophoresis '4mg'$ /ml Dexamethasone, and Manual therapy  PLAN FOR NEXT SESSION:  continue lumbar mobility, decompression and traction for L LE pain.    Lyndee Hensen, PT, DPT 11:55 AM  10/27/21

## 2021-10-29 ENCOUNTER — Ambulatory Visit (INDEPENDENT_AMBULATORY_CARE_PROVIDER_SITE_OTHER): Payer: Medicare Other | Admitting: Physical Therapy

## 2021-10-29 ENCOUNTER — Encounter: Payer: Self-pay | Admitting: Physical Therapy

## 2021-10-29 DIAGNOSIS — M25552 Pain in left hip: Secondary | ICD-10-CM

## 2021-10-29 DIAGNOSIS — M5416 Radiculopathy, lumbar region: Secondary | ICD-10-CM | POA: Diagnosis not present

## 2021-10-29 NOTE — Therapy (Signed)
OUTPATIENT PHYSICAL THERAPY LOWER EXTREMITY TREATMENT /Re-Cert   Patient Name: Kaelani Kendrick MRN: [id number] DOB:[birth year], 81 y.o., female Today's Date: 10/29/2021    PT End of Session - 10/29/21 1103     Visit Number 10    Number of Visits 20    Date for PT Re-Evaluation 12/10/21    Authorization Type Medicare. Recert done at visit 10.    PT Start Time 1101    PT Stop Time 1143    PT Time Calculation (min) 42 min    Activity Tolerance Patient tolerated treatment well    Behavior During Therapy [facility] for tasks assessed/performed                 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   Left Buccal Cheek (Keratoacanthoma)   Past Surgical History:  Procedure Laterality Date   BOWEL RESECTION  07/09/2013   Procedure: SMALL BOWEL RESECTION;  Surgeon: Earnstine Regal, MD;  Location: [facility];  Service: General;;   LAPAROTOMY N/A 07/09/2013   Procedure: EXPLORATORY LAPAROTOMY;  Surgeon: Earnstine Regal, MD;  Location: [facility];  Service: General;  Laterality: N/A;   Patient Active Problem List   Diagnosis Date Noted   Ventral hernia 01/01/2021   Diverticulosis 01/05/2019   Dermatitis 76/16/0737   Systolic murmur 10/62/6948   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   Hyperglycemia 10/05/2017   Leg edema 10/05/2017   Hammer toe 10/05/2017   Hot flashes due to menopause 10/05/2017   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 Still most painful in AM.   Eval: Pt states L leg pain,  for 2 months. No injury to report, but pt does lots of gardening. For herself, and grounds for a 7 acre church. States pain low back, into butt, into L thigh, into ankle. No numbness /tingling.  No previous back pain. She has Most pain in morning, then around 9 am, feels very little pain.  Notes Hiatal hernia, bothersome, but no pain  Also has pain in R foot, has had hammer toe(2nd toe) and bump/calcification on R gr toe for quite some time. Now having ongoing pain at base of 1st gr toe. States callous or wart.  PERTINENT HISTORY: Hiatal hernia, R foot pain, HTN  PAIN:  Are you having pain? Yes: NPRS scale: 6/10 Pain location: L back into Leg,    Pain description: pain, radiaitng pain Aggravating factors: first thing in am (worse after increased activity the day prior) Relieving factors: activity/movement.   PRECAUTIONS: None  WEIGHT BEARING RESTRICTIONS No  FALLS:  Has patient fallen in last 6 months? No  PLOF: Independent  PATIENT GOALS Decreased pain in L back, hip, leg, R foot.    OBJECTIVE: 10/29/12   COGNITION:  Overall cognitive status: Within functional limits for tasks assessed   PALPATION: Pain and tenderness in  L TFL, ITB, Lateral Quad. Pain in Lateral hamstring and  Pain at base of R great toe, with plantars wart,  does have significant stiffness in bil  gr toe flexion. Noted R 2nd toe hammer toe, with retraction and crossover in standing.   LE ROM: Lumbar: ROM: flexion: mod limitation, Ext: min/mod limitation, SB: WFL Hip ROM: mild limitation for ER bil;    LE MMT:  Hip flex/ext: 4/5,  Hip abd: 4/5   TODAY'S TREATMENT:  10/29/2021  Therapeutic Exercise: Aerobic:  Supine:  SLR x 10 on L;  Seated: Sit to stand x 10,  LAQ 2.5 lb x 15 bil;  Standing:  Hip abd 3 x 10 bil;  Stretches:  Seated fig 4 piriformis 30 sec x 2 bil;  Quad stretch in s/l - manual; seated HSS 30 sec x 3 ; Standing hip flexor stretch 30 sec x3 on L;  Neuromuscular  Re-education: Manual Therapy: Hip inf mobs  Long leg distraction for hip and lumbar;  Manual HS and quad stretches.    PATIENT EDUCATION:  Education details:  reviewed HEP,  Person educated: Patient Education method: Explanation, Demonstration, Tactile cues, Verbal cues, and Handouts Education comprehension: verbalized understanding, returned demonstration, verbal cues required, tactile cues required, and needs further education   HOME EXERCISE PROGRAM: Access Code: [email or web address]   ASSESSMENT:  CLINICAL IMPRESSION: 10/29/2021 Pt with improving tenderness to palpation in L gr troch, and improved pain in lateral hamstring as well. She has much tenderness in quad/lateral quad today. She is doing well with light strengthening. She continues to have most pain in AM s, pain at this time has not changed much. Pt to benefit from continued care, to improve pain , muscle tension and strength.      OBJECTIVE IMPAIRMENTS Abnormal gait, decreased activity tolerance, decreased balance, decreased knowledge of use of DME, decreased mobility, difficulty walking, decreased ROM, decreased strength, hypomobility, increased muscle spasms, impaired flexibility, improper body mechanics, and pain.   ACTIVITY LIMITATIONS cleaning, community activity, meal prep, occupation, yard work, and shopping.   PERSONAL FACTORS  none  are also affecting patient's functional outcome.    REHAB POTENTIAL: Good  CLINICAL DECISION MAKING: Stable/uncomplicated  EVALUATION COMPLEXITY: Low   GOALS: Goals reviewed with patient? Yes  SHORT TERM GOALS: Target date: 10/06/21  Pt to be independent with initial HEP  Goal status: MET  2.  Pt to report decreased pain in back and LE to 4/10 in AM.   Goal status: IN PROGRESS     LONG TERM GOALS: Target date: 12/10/21  Pt to be independent with final HEP  Goal status: IN PROGRESS  2.  Pt to report decreased pain in back and LE to 0-2/10 in AM.   Goal status: IN  PROGRESS  3.  Pt to demo ability and optimal mechanics for bend/lift/squat to improve ability for gardening.   Goal status: IN PROGRESS  4.  Pt to report ability for standing/walking activity for at least 1 hr without pain greater than 2/10.   Goal status: IN PROGRESS    PLAN: PT FREQUENCY: 1-2x/week  PT DURATION: 6 weeks  PLANNED INTERVENTIONS: Therapeutic exercises, Therapeutic activity, Neuromuscular re-education, Balance training, Gait training, Patient/Family education, Joint manipulation, Joint mobilization, Stair training, DME instructions, Dry Needling, Electrical stimulation, Spinal manipulation, Spinal mobilization, Cryotherapy, Moist heat, Taping, Traction, Ultrasound, Ionotophoresis 4mg /ml Dexamethasone, and Manual therapy  PLAN FOR NEXT SESSION:  continue lumbar mobility, decompression and traction for L LE pain.    Lyndee Hensen, PT, DPT 11:57 AM  10/29/21

## 2021-10-30 ENCOUNTER — Other Ambulatory Visit: Payer: Self-pay | Admitting: *Deleted

## 2021-10-30 MED ORDER — PREMPRO 0.45-1.5 MG PO TABS: 1.0000 | ORAL_TABLET | Freq: Every day | ORAL | 0 refills | Status: DC

## 2021-11-03 ENCOUNTER — Ambulatory Visit (INDEPENDENT_AMBULATORY_CARE_PROVIDER_SITE_OTHER): Payer: Medicare Other | Admitting: Physical Therapy

## 2021-11-03 ENCOUNTER — Encounter: Payer: Self-pay | Admitting: Physical Therapy

## 2021-11-03 DIAGNOSIS — M25552 Pain in left hip: Secondary | ICD-10-CM | POA: Diagnosis not present

## 2021-11-03 DIAGNOSIS — M5416 Radiculopathy, lumbar region: Secondary | ICD-10-CM

## 2021-11-03 NOTE — Progress Notes (Unsigned)
    [name] D.[place] Phone: [phone]   Assessment and Plan:     There are no diagnoses linked to this encounter.  ***   Pertinent previous records reviewed include ***   Follow Up: ***     Subjective:   I, Ellen [name], am serving as a Education administrator for Doctor [name]   Chief Complaint: left hip pain    HPI:    10/21/2021 Patient is a 81 year old female complaining of left hip pain. 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   11/04/2021 Patient states   Relevant Historical Information: ***  Additional pertinent review of systems negative.   Current Outpatient Medications:    estrogen, conjugated,-medroxyprogesterone (PREMPRO) 0.45-1.5 MG tablet, Take 1 tablet by mouth daily., Disp: 28 tablet, Rfl: 0   furosemide (LASIX) 20 MG tablet, TAKE 1 TABLET(20 MG) BY MOUTH DAILY, Disp: 30 tablet, Rfl: 3   hydrochlorothiazide (MICROZIDE) 12.5 MG capsule, TAKE 1 CAPSULE(12.5 MG) BY MOUTH DAILY, Disp: 90 capsule, Rfl: 1   losartan (COZAAR) 50 MG tablet, TAKE 1 TABLET(50 MG) BY MOUTH DAILY, Disp: 90 tablet, Rfl: 1   meloxicam (MOBIC) 15 MG tablet, TAKE 1 TABLET(15 MG) BY MOUTH DAILY, Disp: 90 tablet, Rfl: 0   naproxen sodium (ALEVE) 220 MG tablet, Aleve 220 mg tablet   1 tablet every day by oral route., Disp: , Rfl:    triamcinolone cream (KENALOG) 0.5 %, Mix 1:1 with lotion and apply twice daily for 1-2 weeks., Disp: 454 g, Rfl: 0   Objective:     There were no vitals filed for this visit.    There is no height or weight on file to  calculate BMI.    Physical Exam:    ***   Electronically signed by:  [name] D.[name] 11:57 AM 11/03/21

## 2021-11-03 NOTE — Therapy (Signed)
OUTPATIENT PHYSICAL THERAPY LOWER EXTREMITY TREATMENT    Patient Name: Ellen [name] MRN: [id number] DOB:19-May-1940, 81 y.o., female Today's Date: 11/03/2021    PT End of Session - 11/03/21 0852     Visit Number 11    Number of Visits 20    Date for PT Re-Evaluation 12/10/21    Authorization Type Medicare. Recert done at visit 10.    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   Left Buccal Cheek (Keratoacanthoma)   Past Surgical History:  Procedure Laterality Date   BOWEL RESECTION  07/09/2013   Procedure: SMALL BOWEL RESECTION;  Surgeon: Earnstine Regal, MD;  Location: [facility];  Service: General;;   LAPAROTOMY N/A 07/09/2013   Procedure: EXPLORATORY LAPAROTOMY;  Surgeon: Earnstine Regal, MD;  Location: [facility];  Service: General;  Laterality: N/A;   Patient Active Problem List   Diagnosis Date Noted   Ventral hernia 01/01/2021   Diverticulosis 01/05/2019   Dermatitis 42/70/6237   Systolic murmur 62/83/1517   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   Hyperglycemia 10/05/2017   Leg edema 10/05/2017   Hammer toe 10/05/2017   Hot flashes due to menopause 10/05/2017   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 Still most painful in AM.   Eval: Pt states L leg pain, for 2  months. No injury to report, but pt does lots of gardening. For herself, and grounds for a 7 acre church. States pain low back, into butt, into L thigh, into ankle. No numbness /tingling.  No previous back pain. She has Most pain in morning, then around 9 am, feels very little pain.  Notes Hiatal hernia, bothersome, but no pain  Also has pain in R foot, has had hammer toe(2nd toe) and bump/calcification on R gr toe for quite some time. Now having ongoing pain at base of 1st gr toe. States callous or wart.  PERTINENT HISTORY: Hiatal hernia, R foot pain, HTN  PAIN:  Are you having pain? Yes: NPRS scale: 6/10 Pain location: L back into Leg,    Pain description: pain, radiaitng pain Aggravating factors: first thing in am (worse after increased activity the day prior) Relieving factors: activity/movement.   PRECAUTIONS: None  WEIGHT BEARING RESTRICTIONS No  FALLS:  Has patient fallen in last 6 months? No  PLOF: Independent  PATIENT GOALS Decreased pain in L back, hip, leg, R foot.    OBJECTIVE: 10/29/12   COGNITION:  Overall cognitive status: Within functional limits for tasks assessed   PALPATION: Pain and tenderness in  L TFL, ITB, Lateral Quad. Pain in Lateral hamstring and  Pain at base of R great toe, with plantars wart,  does have significant stiffness in bil  gr toe flexion. Noted R 2nd toe hammer toe, with retraction and crossover in standing.   LE ROM: Lumbar: ROM: flexion: mod limitation, Ext: min/mod limitation, SB: WFL Hip ROM: mild limitation for ER bil;    LE MMT:  Hip flex/ext: 4/5,  Hip abd: 4/5   TODAY'S TREATMENT:  11/03/2021  Therapeutic Exercise: Aerobic:  Supine:   S/L hip abd and clams x 15 ea on L  Seated: Sit to stand x 10,  HSC on L x 20 on L;  Standing:  Hip abd 2 x 10 bil;  Stretches:  seated HSS 30 sec x 3 ; Supine hip flexor stretch 30 sec x3 on L;  Neuromuscular Re-education: Manual Therapy: Hip inf mobs  Long leg distraction for hip and  lumbar;  Manual HS , quad and hip rotation stretches. STM/IASTM to lateral hip and ITB.    PATIENT EDUCATION:  Education details:  reviewed HEP,  Person educated: Patient Education method: Explanation, Demonstration, Tactile cues, Verbal cues, and Handouts Education comprehension: verbalized understanding, returned demonstration, verbal cues required, tactile cues required, and needs further education   HOME EXERCISE PROGRAM: Access Code: [email or web address]   ASSESSMENT:  CLINICAL IMPRESSION: 11/03/2021 Pt continues to have bothersome pain. Pain levels lower than they were originally, but pt still having pain daily, mostly in AM. She is doing well with exercises and ability for functional activity. She continues to have pain in lateral hip, ITB and lateral quad. Pt will see MD this week, will continue based on recommendations, will make d/c plan at next visit .      OBJECTIVE IMPAIRMENTS Abnormal gait, decreased activity tolerance, decreased balance, decreased knowledge of use of DME, decreased mobility, difficulty walking, decreased ROM, decreased strength, hypomobility, increased muscle spasms, impaired flexibility, improper body mechanics, and pain.   ACTIVITY LIMITATIONS cleaning, community activity, meal prep, occupation, yard work, and shopping.   PERSONAL FACTORS  none  are also affecting patient's functional outcome.    REHAB POTENTIAL: Good  CLINICAL DECISION MAKING: Stable/uncomplicated  EVALUATION COMPLEXITY: Low   GOALS: Goals reviewed with patient? Yes  SHORT TERM GOALS: Target date: 10/06/21  Pt to be independent with initial HEP  Goal status: MET  2.  Pt to report decreased pain in back and LE to 4/10 in AM.   Goal status: IN PROGRESS     LONG TERM GOALS: Target date: 12/10/21  Pt to be independent with final HEP  Goal status: IN PROGRESS  2.  Pt to report decreased pain in back and LE to 0-2/10 in AM.   Goal status: IN PROGRESS  3.  Pt to demo ability  and optimal mechanics for bend/lift/squat to improve ability for gardening.   Goal status: IN PROGRESS  4.  Pt to report ability for standing/walking activity for at least 1 hr without pain greater than 2/10.   Goal status: IN PROGRESS    PLAN: PT FREQUENCY: 1-2x/week  PT DURATION: 6 weeks  PLANNED INTERVENTIONS: Therapeutic exercises, Therapeutic activity, Neuromuscular re-education, Balance training, Gait training, Patient/Family education, Joint manipulation, Joint mobilization, Stair training, DME instructions, Dry Needling, Electrical stimulation, Spinal manipulation, Spinal mobilization, Cryotherapy, Moist heat, Taping, Traction, Ultrasound, Ionotophoresis 4mg /ml Dexamethasone, and Manual therapy  PLAN FOR NEXT SESSION:    Lyndee Hensen, PT, DPT 8:59 AM  11/03/21

## 2021-11-04 ENCOUNTER — Ambulatory Visit (INDEPENDENT_AMBULATORY_CARE_PROVIDER_SITE_OTHER): Payer: Medicare Other | Admitting: Sports Medicine

## 2021-11-04 VITALS — BP 132/82 | HR 71 | Ht 64.0 in | Wt 112.0 lb

## 2021-11-04 DIAGNOSIS — M1612 Unilateral primary osteoarthritis, left hip: Secondary | ICD-10-CM | POA: Diagnosis not present

## 2021-11-04 DIAGNOSIS — B070 Plantar wart: Secondary | ICD-10-CM | POA: Diagnosis not present

## 2021-11-04 DIAGNOSIS — M25552 Pain in left hip: Secondary | ICD-10-CM | POA: Diagnosis not present

## 2021-11-04 DIAGNOSIS — M7632 Iliotibial band syndrome, left leg: Secondary | ICD-10-CM

## 2021-11-04 DIAGNOSIS — M7062 Trochanteric bursitis, left hip: Secondary | ICD-10-CM

## 2021-11-04 NOTE — Patient Instructions (Addendum)
Good to see you  Tylenol 250-500 mg 2x  times a day for pain relief  Continue PT focusing on IT band  Referral to podiatry  As needed follow up

## 2021-11-05 ENCOUNTER — Ambulatory Visit (INDEPENDENT_AMBULATORY_CARE_PROVIDER_SITE_OTHER): Payer: Medicare Other | Admitting: Physical Therapy

## 2021-11-05 ENCOUNTER — Encounter: Payer: Self-pay | Admitting: Physical Therapy

## 2021-11-05 DIAGNOSIS — M25552 Pain in left hip: Secondary | ICD-10-CM | POA: Diagnosis not present

## 2021-11-05 DIAGNOSIS — M5416 Radiculopathy, lumbar region: Secondary | ICD-10-CM

## 2021-11-05 NOTE — Therapy (Signed)
OUTPATIENT PHYSICAL THERAPY LOWER EXTREMITY TREATMENT    Patient Name: Judy Pollman MRN: [id number] DOB:Aug 31, 1940, 81 y.o., female Today's Date: 11/05/2021    PT End of Session - 11/05/21 1208     Visit Number 12    Number of Visits 20    Date for PT Re-Evaluation 12/10/21    Authorization Type Medicare. Recert done at visit 10.    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   Left Buccal Cheek (Keratoacanthoma)   Past Surgical History:  Procedure Laterality Date   BOWEL RESECTION  07/09/2013   Procedure: SMALL BOWEL RESECTION;  Surgeon: Earnstine Regal, MD;  Location: [facility];  Service: General;;   LAPAROTOMY N/A 07/09/2013   Procedure: EXPLORATORY LAPAROTOMY;  Surgeon: Earnstine Regal, MD;  Location: [facility];  Service: General;  Laterality: N/A;   Patient Active Problem List   Diagnosis Date Noted   Ventral hernia 01/01/2021   Diverticulosis 01/05/2019   Dermatitis 81/19/1478   Systolic murmur 29/56/2130   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 Pain is still there, but is better. Saw MD yesterday who recommended  continued PT.    Eval: Pt states L leg pain, for 2 months. No injury to report, but pt does lots of gardening. For herself, and grounds for a 7 acre church. States pain low back, into butt, into L thigh, into ankle. No numbness /tingling.  No previous back pain. She has Most pain in morning, then around 9 am, feels very little pain.  Notes Hiatal hernia, bothersome, but no pain  Also has pain in R foot, has had hammer toe(2nd toe) and bump/calcification on R gr toe for quite some time. Now having ongoing pain at base of 1st gr toe. States callous or wart.  PERTINENT HISTORY: Hiatal hernia, R foot pain, HTN  PAIN:  Are you having pain? Yes: NPRS scale: 6/10 Pain location: L back into Leg,    Pain description: pain, radiaitng pain Aggravating factors: first thing in am (worse after increased activity the day prior) Relieving factors: activity/movement.   PRECAUTIONS: None  WEIGHT BEARING RESTRICTIONS No  FALLS:  Has patient fallen in last 6 months? No  PLOF: Independent  PATIENT GOALS Decreased pain in L back, hip, leg, R foot.    OBJECTIVE: 10/29/12   COGNITION:  Overall cognitive status: Within functional limits for tasks assessed   PALPATION: Pain and tenderness in  L TFL, ITB, Lateral Quad. Pain in Lateral hamstring and  Pain at base of R great  toe, with plantars wart,  does have significant stiffness in bil gr toe flexion. Noted R 2nd toe hammer toe, with retraction and crossover in standing.   LE ROM: Lumbar: ROM: flexion: mod limitation, Ext: min/mod limitation, SB: WFL Hip ROM: mild limitation for ER bil;    LE MMT:  Hip flex/ext: 4/5,  Hip abd: 4/5   TODAY'S TREATMENT:  11/05/2021 Therapeutic Exercise: Aerobic: L1 x 8 min;  Supine:  Bridging x 20;  clams x 15 GTB, alternating.  Seated: Sit to stand x 10,   Standing:  Hip abd 2 x 10 bil;  HSC on L x 15;  Stretches:   Neuromuscular Re-education: Manual Therapy: Hip inf mobs  Long leg distraction for hip  and lumbar;    PATIENT EDUCATION:  Education details:  reviewed HEP,  Person educated: Patient Education method: Consulting civil engineer, Demonstration, Tactile cues, Verbal cues, and Handouts Education comprehension: verbalized understanding, returned demonstration, verbal cues required, tactile cues required, and needs further education   HOME EXERCISE PROGRAM: Access Code: [email or web address]   ASSESSMENT:  CLINICAL IMPRESSION: 11/05/2021 Pt saw MD yesterday who recommended continued care. She is still having pain in L thigh, ITB, and hip, but feels that pain is improving. Focus on more strengthening today, pt with no increased pain during session. Pt to benefit from continued care, to further improve pain and improve strength. Pt to benefit from continued manual and pain relief, as well as progression of strength as tolerated.      OBJECTIVE IMPAIRMENTS Abnormal gait, decreased activity tolerance, decreased balance, decreased knowledge of use of DME, decreased mobility, difficulty walking, decreased ROM, decreased strength, hypomobility, increased muscle spasms, impaired flexibility, improper body mechanics, and pain.   ACTIVITY LIMITATIONS cleaning, community activity, meal prep, occupation, yard work, and shopping.   PERSONAL FACTORS  none  are also affecting patient's functional outcome.    REHAB POTENTIAL: Good  CLINICAL DECISION MAKING: Stable/uncomplicated  EVALUATION COMPLEXITY: Low   GOALS: Goals reviewed with patient? Yes  SHORT TERM GOALS: Target date: 10/06/21  Pt to be independent with initial HEP  Goal status: MET  2.  Pt to report decreased pain in back and LE to 4/10 in AM.   Goal status: IN PROGRESS     LONG TERM GOALS: Target date: 12/10/21  Pt to be independent with final HEP  Goal status: IN PROGRESS  2.  Pt to report decreased pain in back and LE to 0-2/10 in AM.   Goal status: IN PROGRESS  3.  Pt to demo ability and optimal mechanics for bend/lift/squat to  improve ability for gardening.   Goal status: IN PROGRESS  4.  Pt to report ability for standing/walking activity for at least 1 hr without pain greater than 2/10.   Goal status: IN PROGRESS    PLAN: PT FREQUENCY: 1-2x/week  PT DURATION: 6 weeks  PLANNED INTERVENTIONS: Therapeutic exercises, Therapeutic activity, Neuromuscular re-education, Balance training, Gait training, Patient/Family education, Joint manipulation, Joint mobilization, Stair training, DME instructions, Dry Needling, Electrical stimulation, Spinal manipulation, Spinal mobilization, Cryotherapy, Moist heat, Taping, Traction, Ultrasound, Ionotophoresis 60m/ml Dexamethasone, and Manual therapy  PLAN FOR NEXT SESSION:    LLyndee Hensen PT, DPT 12:11 PM  11/05/21

## 2021-11-11 ENCOUNTER — Ambulatory Visit (INDEPENDENT_AMBULATORY_CARE_PROVIDER_SITE_OTHER): Payer: Medicare Other | Admitting: Physical Therapy

## 2021-11-11 ENCOUNTER — Encounter: Payer: Self-pay | Admitting: Physical Therapy

## 2021-11-11 DIAGNOSIS — M25552 Pain in left hip: Secondary | ICD-10-CM

## 2021-11-11 DIAGNOSIS — M5416 Radiculopathy, lumbar region: Secondary | ICD-10-CM | POA: Diagnosis not present

## 2021-11-12 ENCOUNTER — Encounter: Payer: Self-pay | Admitting: Physical Therapy

## 2021-11-12 ENCOUNTER — Ambulatory Visit (INDEPENDENT_AMBULATORY_CARE_PROVIDER_SITE_OTHER): Payer: Medicare Other | Admitting: Physical Therapy

## 2021-11-12 DIAGNOSIS — M25552 Pain in left hip: Secondary | ICD-10-CM | POA: Diagnosis not present

## 2021-11-12 DIAGNOSIS — M5416 Radiculopathy, lumbar region: Secondary | ICD-10-CM | POA: Diagnosis not present

## 2021-11-12 NOTE — Therapy (Signed)
OUTPATIENT PHYSICAL THERAPY LOWER EXTREMITY TREATMENT    Patient Name: Ellen [name] MRN: [id number] DOB:July 09, 1940, 81 y.o., female Today's Date: 11/12/2021    PT End of Session - 11/12/21 1013     Visit Number 14    Number of Visits 20    Date for PT Re-Evaluation 12/10/21    Authorization Type Medicare. Recert done at visit 10.    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   Left Buccal Cheek (Keratoacanthoma)   Past Surgical History:  Procedure Laterality Date   BOWEL RESECTION  07/09/2013   Procedure: SMALL BOWEL RESECTION;  Surgeon: Earnstine Regal, MD;  Location: [facility];  Service: General;;   LAPAROTOMY N/A 07/09/2013   Procedure: EXPLORATORY LAPAROTOMY;  Surgeon: Earnstine Regal, MD;  Location: [facility];  Service: General;  Laterality: N/A;   Patient Active Problem List   Diagnosis Date Noted   Ventral hernia 01/01/2021   Diverticulosis 01/05/2019   Dermatitis 09/40/7680   Systolic murmur 88/03/314   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   Hyperglycemia 10/05/2017   Leg edema 10/05/2017   Hammer toe 10/05/2017   Hot flashes due to menopause 10/05/2017   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   Eval: Pt states L leg pain, for 2  months. No injury to report, but pt does lots of gardening. For herself, and grounds for a 7 acre church. States pain low back, into butt, into L thigh, into ankle. No numbness /tingling.  No previous back pain. She has Most pain in morning, then around 9 am, feels very little pain.  Notes Hiatal hernia, bothersome, but no pain  Also has pain in R foot, has had hammer toe(2nd toe) and bump/calcification on R gr toe for quite some time. Now having ongoing pain at base of 1st gr toe. States callous or wart.  PERTINENT HISTORY: Hiatal hernia, R foot pain, HTN  PAIN:  Are you having pain? NO Yes: NPRS scale: 0/10 Pain location: L back into Leg,    Pain description: pain, radiaitng pain Aggravating factors: first thing in am (worse after increased activity the day prior) Relieving factors: activity/movement.   PRECAUTIONS: None  WEIGHT BEARING RESTRICTIONS No  FALLS:  Has patient fallen in last 6 months? No  PLOF: Independent  PATIENT GOALS Decreased pain in L back, hip, leg, R foot.    OBJECTIVE: 10/29/12   COGNITION:  Overall cognitive status: Within functional limits for tasks assessed   PALPATION: Pain and tenderness in  L TFL, ITB, Lateral Quad. Pain in Lateral hamstring and  Pain at base of R great toe, with plantars wart,  does have significant stiffness  in bil gr toe flexion. Noted R 2nd toe hammer toe, with retraction and crossover in standing.   LE ROM: Lumbar: ROM: flexion: mod limitation, Ext: min/mod limitation, SB: WFL Hip ROM: mild limitation for ER bil;    LE MMT:  Hip flex/ext: 4/5,  Hip abd: 4/5   TODAY'S TREATMENT: 11/12/2021 Therapeutic Exercise: Aerobic: Supine:  hip IR 1 minute B, Hip ER B 2 minutes  S/l: reverse clams 2x15 B, clam holds x3 10" holds B, feet on wall hip IR 2 minutes  x2, legs on wall bridges 2x10 Seated:   Standing:    Stretches:   Neuromuscular Re-education: TRA activation with long exhale and tactile/verbal cues 10 minutes total   Manual Therapy:  PATIENT EDUCATION:  Education details:  HEP Person educated: Patient Education method: Consulting civil engineer, Media planner, Corporate treasurer cues, Verbal cues, and Handouts Education comprehension: verbalized understanding, returned demonstration, verbal cues required, tactile cues required, and needs further education   HOME EXERCISE PROGRAM: Access Code: [email or web address]   ASSESSMENT:  CLINICAL IMPRESSION: 11/12/2021 Continued to progress hip IR exercises. Added bridge and this was not tolerated well reporting pain in leg. Educated patient in anatomy and rationale for core activation to improve functional mobility. Tolerated exercise well with no pain but easily fatigued in core. Will continue with current POC.     OBJECTIVE IMPAIRMENTS Abnormal gait, decreased activity tolerance, decreased balance, decreased knowledge of use of DME, decreased mobility, difficulty walking, decreased ROM, decreased strength, hypomobility, increased muscle spasms, impaired flexibility, improper body mechanics, and pain.   ACTIVITY LIMITATIONS cleaning, community activity, meal prep, occupation, yard work, and shopping.   PERSONAL FACTORS  none  are also affecting patient's functional outcome.    REHAB POTENTIAL: Good  CLINICAL DECISION MAKING: Stable/uncomplicated  EVALUATION COMPLEXITY: Low   GOALS: Goals reviewed with patient? Yes  SHORT TERM GOALS: Target date: 10/06/21  Pt to be independent with initial HEP  Goal status: MET  2.  Pt to report decreased pain in back and LE to 4/10 in AM.   Goal status: IN PROGRESS     LONG TERM GOALS: Target date: 12/10/21  Pt to be independent with final HEP  Goal status: IN PROGRESS  2.  Pt to report decreased pain in back and LE to 0-2/10 in AM.   Goal status: IN PROGRESS  3.  Pt to demo ability and optimal mechanics for bend/lift/squat to improve ability for gardening.   Goal status: IN PROGRESS  4.  Pt to report ability for  standing/walking activity for at least 1 hr without pain greater than 2/10.   Goal status: IN PROGRESS    PLAN: PT FREQUENCY: 1-2x/week  PT DURATION: 6 weeks  PLANNED INTERVENTIONS: Therapeutic exercises, Therapeutic activity, Neuromuscular re-education, Balance training, Gait training, Patient/Family education, Joint manipulation, Joint mobilization, Stair training, DME instructions, Dry Needling, Electrical stimulation, Spinal manipulation, Spinal mobilization, Cryotherapy, Moist heat, Taping, Traction, Ultrasound, Ionotophoresis 60m/ml Dexamethasone, and Manual therapy  PLAN FOR NEXT SESSION:      10:55 AM, 11/12/21 MJerene Pitch DPT Physical Therapy with CRoyston Sinner

## 2021-11-24 ENCOUNTER — Encounter: Payer: Medicare Other | Admitting: Physical Therapy

## 2021-11-26 ENCOUNTER — Ambulatory Visit (INDEPENDENT_AMBULATORY_CARE_PROVIDER_SITE_OTHER): Payer: Medicare Other | Admitting: Physical Therapy

## 2021-11-26 ENCOUNTER — Encounter: Payer: Self-pay | Admitting: Physical Therapy

## 2021-11-26 ENCOUNTER — Ambulatory Visit (INDEPENDENT_AMBULATORY_CARE_PROVIDER_SITE_OTHER): Payer: Medicare Other | Admitting: Podiatry

## 2021-11-26 DIAGNOSIS — M5416 Radiculopathy, lumbar region: Secondary | ICD-10-CM | POA: Diagnosis not present

## 2021-11-26 DIAGNOSIS — B070 Plantar wart: Secondary | ICD-10-CM | POA: Diagnosis not present

## 2021-11-26 DIAGNOSIS — M25552 Pain in left hip: Secondary | ICD-10-CM | POA: Diagnosis not present

## 2021-11-26 NOTE — Progress Notes (Signed)
   Subjective: 81 y.o. female presenting today for evaluation of a symptomatic skin lesion to the plantar aspect of the right foot this been present for several years.  She says that she has seen podiatry in the past who debrided it and it felt better only temporarily.  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   Dermatology: Hyperkeratotic skin lesion(s) noted to the plantar aspect of the right foot approximately 1 cm in diameter. Pinpoint bleeding noted upon debridement. Skin is warm, dry and supple bilateral lower extremities. Negative for open lesions or macerations.   Vascular: Palpable pedal pulses bilaterally. No edema or erythema noted. Capillary refill within normal limits.   Neurological: Epicritic and protective threshold grossly intact bilaterally.    Musculoskeletal Exam: Pain on palpation to the noted skin lesion(s).  Range of motion within normal limits to all pedal and ankle joints bilateral. Muscle strength 5/5 in all groups bilateral.    Assessment: #1 plantar wart right foot #2 pain in right foot     Plan of Care:  #1 Patient was evaluated. #2  Prior to debridement the area was prepped aseptically and injection 1 mL of 2% lidocaine with epi was infiltrated in the area due to the high sensitivity and tenderness of the lesion.  Excisional debridement of the plantar wart lesion(s) was performed using a chisel blade.  Salicylic acid was applied and the lesion(s) was dressed with a dry sterile dressing. #3  Salicylic acid provided for the patient to apply daily with a light dressing #4 return to clinic 4 weeks     Edrick Kins, DPM [facility]  Dr. Edrick Kins, DPM    [address], [place] [address]                Office [phone]  Fax [phone]

## 2021-11-26 NOTE — Therapy (Signed)
OUTPATIENT PHYSICAL THERAPY LOWER EXTREMITY TREATMENT    Patient Name: Ellen [name] MRN: [id number] DOB:1940/07/13, 81 y.o., female Today's Date: 11/26/2021    PT End of Session - 11/26/21 1017     Visit Number 15    Number of Visits 20    Date for PT Re-Evaluation 12/10/21    Authorization Type Medicare. Recert done at visit 10.    PT Start Time 1017    PT Stop Time 1056    PT Time Calculation (min) 39 min    Activity Tolerance Patient tolerated treatment well    Behavior During Therapy [facility] for tasks assessed/performed                  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   Left Buccal Cheek (Keratoacanthoma)   Past Surgical History:  Procedure Laterality Date   BOWEL RESECTION  07/09/2013   Procedure: SMALL BOWEL RESECTION;  Surgeon: Earnstine Regal, MD;  Location: [facility];  Service: General;;   LAPAROTOMY N/A 07/09/2013   Procedure: EXPLORATORY LAPAROTOMY;  Surgeon: Earnstine Regal, MD;  Location: [facility];  Service: General;  Laterality: N/A;   Patient Active Problem List   Diagnosis Date Noted   Ventral hernia 01/01/2021   Diverticulosis 01/05/2019   Dermatitis 37/34/2876   Systolic murmur 81/15/7262   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   Hyperglycemia 10/05/2017   Leg edema 10/05/2017   Hammer toe 10/05/2017   Hot flashes due to menopause 10/05/2017   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 Pain in ams still bothersome, in lateral  thigh. After late morning, she has minimal/no pain.   Eval: Pt states L leg pain, for 2 months. No injury to report, but pt does lots of gardening. For herself, and grounds for a 7 acre church. States pain low back, into butt, into L thigh, into ankle. No numbness /tingling.  No previous back pain. She has Most pain in morning, then around 9 am, feels very little pain.  Notes Hiatal hernia, bothersome, but no pain  Also has pain in R foot, has had hammer toe(2nd toe) and bump/calcification on R gr toe for quite some time. Now having ongoing pain at base of 1st gr toe. States callous or wart.  PERTINENT HISTORY: Hiatal hernia, R foot pain, HTN  PAIN:  Are you having pain? NO Yes: NPRS scale: 0/10 Pain location: L back into Leg,    Pain description: pain, radiaitng pain Aggravating factors: first thing in am (worse after increased activity the day prior) Relieving factors: activity/movement.   PRECAUTIONS: None  WEIGHT BEARING RESTRICTIONS No  FALLS:  Has patient fallen in last 6 months? No  PLOF: Independent  PATIENT GOALS Decreased pain in L back, hip, leg, R foot.    OBJECTIVE: 10/29/12   COGNITION:  Overall cognitive status: Within functional limits for tasks assessed   PALPATION: Pain and tenderness in  L TFL, ITB, Lateral Quad. Pain in Lateral hamstring and  Pain  at base of R great toe, with plantars wart,  does have significant stiffness in bil gr toe flexion. Noted R 2nd toe hammer toe, with retraction and crossover in standing.   LE ROM: Lumbar: ROM: flexion: mod limitation, Ext: min/mod limitation, SB: WFL Hip ROM: mild limitation for ER bil;    LE MMT:  Hip flex/ext: 4/5,  Hip abd: 4/5   TODAY'S TREATMENT: 11/26/2021 Therapeutic Exercise: Aerobic: Supine:  Hip IR alternating z 10;  Hip ER B 2 minutes;  bridges with ball squeeze 2x10; Clams with TA GTB x 20; TA with breathing x 10;  Seated:   Standing:   March x20; hip abd 2x10 bil, Hip ext x 10 on L;  squats x 15;  Stretches:   Neuromuscular Re-education:  Manual Therapy:  PATIENT EDUCATION:  Education details:  HEP Person educated: Patient Education method: Consulting civil engineer, Media planner, Corporate treasurer cues, Verbal cues, and Handouts Education comprehension: verbalized understanding, returned demonstration, verbal cues required, tactile cues required, and needs further education   HOME EXERCISE PROGRAM: Access Code: [email or web address]   ASSESSMENT:  CLINICAL IMPRESSION: 11/26/2021 Pt with good tolerance for activity. Does have much tenderness in L upper thigh with palpation, but minimal pain with movement and activity at this time of day. She is overall doing well functionally, still having soreness in AM. She is seeing podiatry today, wants to f/u with PCP, and also will message sports med about continued pain to see if she should f/u. Pt to be seen 1x/next week, will make d/c plan.      OBJECTIVE IMPAIRMENTS Abnormal gait, decreased activity tolerance, decreased balance, decreased knowledge of use of DME, decreased mobility, difficulty walking, decreased ROM, decreased strength, hypomobility, increased muscle spasms, impaired flexibility, improper body mechanics, and pain.   ACTIVITY LIMITATIONS cleaning, community activity, meal prep, occupation, yard work, and shopping.   PERSONAL FACTORS  none  are also affecting patient's functional outcome.    REHAB POTENTIAL: Good  CLINICAL DECISION MAKING: Stable/uncomplicated  EVALUATION COMPLEXITY: Low   GOALS: Goals reviewed with patient? Yes  SHORT TERM GOALS: Target date: 10/06/21  Pt to be independent with initial HEP  Goal status: MET  2.  Pt to report decreased pain in back and LE to 4/10 in AM.   Goal status: IN PROGRESS     LONG TERM GOALS: Target date: 12/10/21  Pt to be independent with final HEP  Goal status: IN PROGRESS  2.  Pt to report decreased pain in back and LE to 0-2/10 in AM.   Goal status: IN  PROGRESS  3.  Pt to demo ability and optimal mechanics for bend/lift/squat to improve ability for gardening.   Goal status: IN PROGRESS  4.  Pt to report ability for standing/walking activity for at least 1 hr without pain greater than 2/10.   Goal status: IN PROGRESS    PLAN: PT FREQUENCY: 1-2x/week  PT DURATION: 6 weeks  PLANNED INTERVENTIONS: Therapeutic exercises, Therapeutic activity, Neuromuscular re-education, Balance training, Gait training, Patient/Family education, Joint manipulation, Joint mobilization, Stair training, DME instructions, Dry Needling, Electrical stimulation, Spinal manipulation, Spinal mobilization, Cryotherapy, Moist heat, Taping, Traction, Ultrasound, Ionotophoresis 4mg /ml Dexamethasone, and Manual therapy  PLAN FOR NEXT SESSION:      Lyndee Hensen, PT, DPT 12:11 PM  11/26/21

## 2021-12-01 ENCOUNTER — Ambulatory Visit (INDEPENDENT_AMBULATORY_CARE_PROVIDER_SITE_OTHER): Payer: Medicare Other | Admitting: Family Medicine

## 2021-12-01 ENCOUNTER — Encounter: Payer: Self-pay | Admitting: Family Medicine

## 2021-12-01 VITALS — BP 122/72 | HR 81 | Temp 98.0°F | Ht 64.0 in | Wt 113.6 lb

## 2021-12-01 DIAGNOSIS — I10 Essential (primary) hypertension: Secondary | ICD-10-CM | POA: Diagnosis not present

## 2021-12-01 DIAGNOSIS — B070 Plantar wart: Secondary | ICD-10-CM | POA: Diagnosis not present

## 2021-12-01 DIAGNOSIS — M1990 Unspecified osteoarthritis, unspecified site: Secondary | ICD-10-CM

## 2021-12-01 DIAGNOSIS — R739 Hyperglycemia, unspecified: Secondary | ICD-10-CM

## 2021-12-01 DIAGNOSIS — E785 Hyperlipidemia, unspecified: Secondary | ICD-10-CM | POA: Diagnosis not present

## 2021-12-01 LAB — COMPREHENSIVE METABOLIC PANEL
ALT: 16 U/L (ref 0–35)
AST: 19 U/L (ref 0–37)
Albumin: 4 g/dL (ref 3.5–5.2)
Alkaline Phosphatase: 62 U/L (ref 39–117)
BUN: 25 mg/dL — ABNORMAL HIGH (ref 6–23)
CO2: 26 mEq/L (ref 19–32)
Calcium: 9.7 mg/dL (ref 8.4–10.5)
Chloride: 103 mEq/L (ref 96–112)
Creatinine, Ser: 0.66 mg/dL (ref 0.40–1.20)
GFR: 81.52 mL/min (ref >60.00)
Glucose, Bld: 94 mg/dL (ref 70–99)
Potassium: 4.1 mEq/L (ref 3.5–5.1)
Sodium: 140 mEq/L (ref 135–145)
Total Bilirubin: 0.5 mg/dL (ref 0.2–1.2)
Total Protein: 6.2 g/dL (ref 6.0–8.3)

## 2021-12-01 LAB — LIPID PANEL
Cholesterol: 176 mg/dL (ref 0–200)
HDL: 76.5 mg/dL (ref >39.00)
LDL Cholesterol: 74 mg/dL (ref 0–99)
NonHDL: 99.35
Total CHOL/HDL Ratio: 2
Triglycerides: 127 mg/dL (ref 0.0–149.0)
VLDL: 25.4 mg/dL (ref 0.0–40.0)

## 2021-12-01 LAB — CBC
HCT: 44.3 % (ref 36.0–46.0)
Hemoglobin: 15 g/dL (ref 12.0–15.0)
MCHC: 33.8 g/dL (ref 30.0–36.0)
MCV: 99 fl (ref 78.0–100.0)
Platelets: 257 10*3/uL (ref 150.0–400.0)
RBC: 4.48 Mil/uL (ref 3.87–5.11)
RDW: 13 % (ref 11.5–15.5)
WBC: 5.8 10*3/uL (ref 4.0–10.5)

## 2021-12-01 LAB — TSH: TSH: 0.6 u[IU]/mL (ref 0.35–5.50)

## 2021-12-01 LAB — HEMOGLOBIN A1C: Hgb A1c MFr Bld: 5.5 % (ref 4.6–6.5)

## 2021-12-01 NOTE — Assessment & Plan Note (Signed)
She is not having much improvement with physical therapy.  Advised her to follow back up with sports medicine to discuss next steps in management.

## 2021-12-01 NOTE — Patient Instructions (Signed)
It was very nice to see you today!  We we will check blood work today.  You can try using lidocaine to see if this helps with your foot pain.  Please follow-up with sports medicine soon if your pain is not improving.  Take care, Dr Jerline Pain  PLEASE NOTE:  If you had any lab tests please let us know if you have not heard back within a few days. You may see your results on mychart before we have a chance to review them but we will give you a call once they are reviewed by [place]. If we ordered any referrals today, please let us know if you have not heard from their office within the next week.   Please try these tips to maintain a healthy lifestyle:  Eat at least 3 REAL meals and 1-2 snacks per day.  Aim for no more than 5 hours between eating.  If you eat breakfast, please do so within one hour of getting up.   Each meal should contain half fruits/vegetables, one quarter protein, and one quarter carbs (no bigger than a computer mouse)  Cut down on sweet beverages. This includes juice, soda, and sweet tea.   Drink at least 1 glass of water with each meal and aim for at least 8 glasses per day  Exercise at least 150 minutes every week.

## 2021-12-01 NOTE — Assessment & Plan Note (Signed)
At goal on losartan 50 mg daily and HCTZ 12.5 mg daily.  Check labs.

## 2021-12-01 NOTE — Assessment & Plan Note (Signed)
Continue management per podiatry.  Discussed pain management.  She can try using cushioning to see if this takes pressure off while walking.  May also consider topical lidocaine.

## 2021-12-01 NOTE — Progress Notes (Signed)
   Ellen [name] is a 81 y.o. female who presents today for an office visit.  Assessment/Plan:  Chronic Problems Addressed Today: Hypertension At goal on losartan 50 mg daily and HCTZ 12.5 mg daily.  Check labs.  Plantar wart Continue management per podiatry.  Discussed pain management.  She can try using cushioning to see if this takes pressure off while walking.  May also consider topical lidocaine.  Osteoarthritis She is not having much improvement with physical therapy.  Advised her to follow back up with sports medicine to discuss next steps in management.  Hyperglycemia Check A1c.   Concern for Weight Loss She is down about 3 pounds over the last couple of years though has fluctuated between 114 and 117 over that time. We will check requested labs today.  Discussed dietary modifications.  We will continue to monitor.    Subjective:  HPI:  See A/P for status of chronic conditions.  Patient was last seen here about 3 months ago.  We discussed her left leg pain since then.  We referred her to physical therapy and she has followed up with sports medicine since then.  Her pain still persist.  She had an x-ray that showed mild to moderate arthritis in left hip.  There was concerned she may have IT band syndrome and possibly trochanteric bursitis.  She did have a trial of a steroid injection in her left trochanteric bursa without significant improvement.  She was told to stop taking her relief.  She did not have any improvement with Mobic.  She is also been recently treated for plantar wart on her right foot.  This causes her a significant mount of pain.  She is worried this could be altering her gait which is making her current pain worse.  She is also concerned about weight loss.  She has lost about 7 pounds in the past year.       Objective:  Physical Exam: BP 122/72   Pulse 81   Temp 98 F (36.7 C) (Temporal)   Ht '5\' 4"'$  (1.626 m)   Wt 113 lb 9.6 oz (51.5 kg)   SpO2 97%    BMI 19.50 kg/m   Wt Readings from Last 3 Encounters:  12/01/21 113 lb 9.6 oz (51.5 kg)  11/04/21 112 lb (50.8 kg)  10/21/21 116 lb (52.6 kg)    Gen: No acute distress, resting comfortably CV: Regular rate and rhythm with no murmurs appreciated Pulm: Normal work of breathing, clear to auscultation bilaterally with no crackles, wheezes, or rhonchi Neuro: Grossly normal, moves all extremities Psych: Normal affect and thought content      [name] M. Jerline Pain, MD 12/01/2021 10:29 AM

## 2021-12-01 NOTE — Assessment & Plan Note (Signed)
Check A1c. 

## 2021-12-02 ENCOUNTER — Encounter: Payer: Medicare Other | Admitting: Physical Therapy

## 2021-12-02 NOTE — Progress Notes (Signed)
Please inform patient of the following:  Her labs are all NORMAL.  Do not need to make any changes to treatment plan at this time.  She should keep up the good work and we can recheck in a year.

## 2021-12-03 ENCOUNTER — Telehealth: Payer: Self-pay | Admitting: Family Medicine

## 2021-12-03 NOTE — Telephone Encounter (Signed)
See results note. 

## 2021-12-03 NOTE — Telephone Encounter (Signed)
Patient returned call and requests to be called at ph# [phone] to discuss lab results

## 2021-12-05 ENCOUNTER — Encounter: Payer: Self-pay | Admitting: Physical Therapy

## 2021-12-05 ENCOUNTER — Ambulatory Visit (INDEPENDENT_AMBULATORY_CARE_PROVIDER_SITE_OTHER): Payer: Medicare Other | Admitting: Physical Therapy

## 2021-12-05 DIAGNOSIS — M5416 Radiculopathy, lumbar region: Secondary | ICD-10-CM | POA: Diagnosis not present

## 2021-12-05 DIAGNOSIS — M25552 Pain in left hip: Secondary | ICD-10-CM | POA: Diagnosis not present

## 2021-12-05 NOTE — Progress Notes (Signed)
It is usually a marker of dehydration.  Hers has been a little bit elevated intermittently over the last several years.  This is not a cause for concern.  She should make sure that she is getting plenty of fluids but we do not need to do anything else at this point.

## 2021-12-05 NOTE — Therapy (Signed)
OUTPATIENT PHYSICAL THERAPY LOWER EXTREMITY TREATMENT    Patient Name: Ellen [name] MRN: [id number] DOB:02-23-1941, 81 y.o., female Today's Date: 12/05/2021    PT End of Session - 12/05/21 1227     Visit Number 16    Number of Visits 20    Date for PT Re-Evaluation 12/10/21    Authorization Type Medicare. Recert done at visit 10.    PT Start Time 1229    PT Stop Time 1311    PT Time Calculation (min) 42 min    Activity Tolerance Patient tolerated treatment well    Behavior During Therapy [facility] for tasks assessed/performed                  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   Left Buccal Cheek (Keratoacanthoma)   Past Surgical History:  Procedure Laterality Date   BOWEL RESECTION  07/09/2013   Procedure: SMALL BOWEL RESECTION;  Surgeon: Earnstine Regal, MD;  Location: [facility];  Service: General;;   LAPAROTOMY N/A 07/09/2013   Procedure: EXPLORATORY LAPAROTOMY;  Surgeon: Earnstine Regal, MD;  Location: [facility];  Service: General;  Laterality: N/A;   Patient Active Problem List   Diagnosis Date Noted   Osteoarthritis 12/01/2021   Plantar wart 12/01/2021   Ventral hernia 01/01/2021   Diverticulosis 01/05/2019   Dermatitis 95/63/8756   Systolic murmur 43/32/9518   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  Notes increased pain in L knee in last couple days,  and pain in R foot, did see podiatry and had place cut off bottom of foot.   Eval: Pt states L leg pain, for 2 months. No injury to report, but pt does lots of gardening. For herself, and grounds for a 7 acre church. States pain low back, into butt, into L thigh, into ankle. No numbness /tingling.  No previous back pain. She has Most pain in morning, then around 9 am, feels very little pain.  Notes Hiatal hernia, bothersome, but no pain  Also has pain in R foot, has had hammer toe(2nd toe) and bump/calcification on R gr toe for quite some time. Now having ongoing pain at base of 1st gr toe. States callous or wart.  PERTINENT HISTORY: Hiatal hernia, R foot pain, HTN  PAIN:  Are you having pain?  Yes: NPRS scale: 7/10 Pain location: L back into Leg,    Pain description: pain, radiaitng pain Aggravating factors: first thing in am (worse after increased activity the day prior) Relieving factors: activity/movement.   PRECAUTIONS: None  WEIGHT BEARING RESTRICTIONS No  FALLS:  Has patient fallen in last 6 months? No  PLOF: Independent  PATIENT GOALS Decreased pain in L back, hip, leg, R foot.    OBJECTIVE: 12/05/21   COGNITION:  Overall cognitive status: Within functional limits for tasks assessed  LE ROM: Lumbar: ROM: flexion: mod limitation, Ext: min/mod limitation, SB: WFL Hip ROM: mild limitation for ER bil;    LE MMT:  Hip flex/ext: 4+/5,  Hip abd: 4/5   TODAY'S TREATMENT: 12/05/2021 Therapeutic Exercise: Aerobic: Bike L1 x 8 min;  Supine:  SLR x 10 bil;  Seated:  LAQ x 20 on L;  Standing:  March x20;  hip abd 2x10 bil,   Stretches:  Hip flexor stretch off side of table x 2 min;  Prone quad stretch (manual) x 5 min;  Neuromuscular Re-education:  Manual Therapy:  PATIENT EDUCATION:  Education details:  reviewed final HEP Person educated: Patient Education method: Explanation, Demonstration, Tactile cues,  Verbal cues, and Handouts Education comprehension: verbalized understanding, returned demonstration, verbal cues required, tactile cues required, and needs further education   HOME EXERCISE PROGRAM: Access Code: [email or web address]     ASSESSMENT:  CLINICAL IMPRESSION: 12/05/2021 Pt has been seen for 16 visits. She continues to have pain in lateral thigh. She also has increased pain in L anterior knee today, likely from squatting/over doing outdoor activity, and increased pain in R foot from recent podiatry treatment. Discussed implications for knee pain today, icing, and not over doing activity. She is doing very well with HEP at this time. She has much improved symptoms, but has not had resolution of pain. Discussed continuing stretching and strengthening for hip/thigh pain. Pt with good understanding of HEP. Plan to have pt return to MD for further assessment as needed. Pt in agreement with plan, will d/c at this time.      OBJECTIVE IMPAIRMENTS Abnormal gait, decreased activity tolerance, decreased balance, decreased knowledge of use of DME, decreased mobility, difficulty walking, decreased ROM, decreased strength, hypomobility, increased muscle spasms, impaired flexibility, improper body mechanics, and pain.   ACTIVITY LIMITATIONS cleaning, community activity, meal prep, occupation, yard work, and shopping.   PERSONAL FACTORS  none  are also affecting patient's functional outcome.    REHAB POTENTIAL: Good  CLINICAL DECISION MAKING: Stable/uncomplicated  EVALUATION COMPLEXITY: Low   GOALS: Goals reviewed with patient? Yes  SHORT TERM GOALS: Target date: 10/06/21  Pt to be independent with initial HEP  Goal status: MET  2.  Pt to report decreased pain in back and LE to 4/10 in AM.   Goal status: Partially Met      LONG TERM GOALS: Target date: 12/10/21  Pt to be independent with final HEP  Goal status: MET  2.  Pt to report decreased pain in back and LE to 0-2/10 in AM.    Goal status: Partially Met   3.  Pt to demo ability and optimal mechanics for bend/lift/squat to improve ability for gardening.   Goal status: MET  4.  Pt to report ability for standing/walking activity for at least 1 hr without pain greater than 2/10.   Goal status: MET    PLAN: PT FREQUENCY: 1-2x/week  PT DURATION: 6 weeks  PLANNED INTERVENTIONS: Therapeutic exercises, Therapeutic activity, Neuromuscular re-education, Balance training, Gait training, Patient/Family education, Joint manipulation, Joint mobilization, Stair training, DME instructions, Dry Needling, Electrical stimulation, Spinal manipulation, Spinal mobilization, Cryotherapy, Moist heat, Taping, Traction, Ultrasound, Ionotophoresis 4mg /ml Dexamethasone, and Manual therapy  PLAN FOR NEXT SESSION:      Lyndee Hensen, PT, DPT 1:20 PM  12/05/21    PHYSICAL THERAPY DISCHARGE SUMMARY  Visits from Start of Care: 16   Plan: Patient agrees to discharge.  Patient goals were met. Patient is being discharged due to meeting the stated rehab goals.  Lyndee Hensen, PT, DPT 1:26 PM  12/05/21

## 2021-12-22 NOTE — Progress Notes (Signed)
Ellen [name] D.[place] Phone: [phone]   Assessment and Plan:     1. Left hip pain 2. Primary osteoarthritis of left hip -Chronic with exacerbation, subsequent visit - Continued left leg and left thigh pain that has not significantly improved with conservative therapy including greater trochanteric CSI and regular physical therapy - Patient elected for intra-articular hip CSI.  Tolerated well per note below - Continue HEP and physical therapy - [place] LIMITED JOINT SPACE STRUCTURES LOW BILAT(NO LINKED CHARGES)    Procedure: Ultrasound Guided Hip Acetabulofemoral Joint Injection Side: Left Diagnosis: Femoral acetabular osteoarthritis [place] Indication:  - accuracy is paramount for diagnosis - to ensure therapeutic efficacy or procedural success - to reduce procedural risk  After explaining the procedure, viable alternatives, risks, and answering any questions, consent was given verbally. The site was cleaned with chlorhexidine prep. An ultrasound transducer was placed on the anterior thigh/hip.   The acetabular joint, labrum, and femoral shaft were identified.  The neurovascular structures were identified and an approach was found specifically avoiding these structures.  A steroid injection was performed under ultrasound guidance with sterile technique using 61m of 1% lidocaine without epinephrine and 40 mg of triamcinolone (KENALOG) '40mg'$ /ml. This was well tolerated and resulted in  relief.  Needle was removed and dressing placed and post injection instructions were given including  a discussion of likely return of pain today after the anesthetic wears off (with the possibility of worsened pain) until the steroid starts to work in 1-3 days.   Pt was advised to call or return to clinic if these symptoms worsen or fail to improve as anticipated.   Pertinent previous records reviewed include PT notes from 12/05/2021,  11/26/2021, 11/12/2021, 11/11/2021   Follow Up: 3 weeks for reevaluation.  If no improvement or worsening of symptoms, would further investigate lumbar spine as cause of patient's symptoms and could consider advanced imaging for potential epidural   Subjective:   I, Ellen [name], am serving as a sEducation administratorfor Doctor BGlennon Mac  Chief Complaint: left hip pain    HPI:    10/21/2021 Patient is a 81year old female complaining of left hip pain. Patient states that meloxicam didn't work for her ,has just been taking aleve 2 months ago didn't no what she did , is a pHealth and safety inspector whole left side of her thigh hurts really bad, no MOI, no locking clicking or popping, no numbness or tingling just pain, when she wakes up in the morning she has to hobble, is also a care giver, cant have any meds that make her loopy because she is a care giver    11/04/2021 Patient states that she is better , "the damn hamstrings still hurt " its more of an annoying pain but better than when she came and saw [place] 12/30/2021 Patient states that she is the same, still hurting       Relevant Historical Information: None pertinent Additional pertinent review of systems negative.   Current Outpatient Medications:    estrogen, conjugated,-medroxyprogesterone (PREMPRO) 0.45-1.5 MG tablet, Take 1 tablet by mouth daily., Disp: 28 tablet, Rfl: 0   hydrochlorothiazide (MICROZIDE) 12.5 MG capsule, TAKE 1 CAPSULE(12.5 MG) BY MOUTH DAILY, Disp: 90 capsule, Rfl: 1   losartan (COZAAR) 50 MG tablet, TAKE 1 TABLET(50 MG) BY MOUTH DAILY, Disp: 90 tablet, Rfl: 1   triamcinolone cream (KENALOG) 0.5 %, Mix 1:1 with lotion and apply twice daily for  1-2 weeks., Disp: 454 g, Rfl: 0   Objective:     Vitals:   12/30/21 1122  BP: 130/84  Pulse: 67  SpO2: 97%  Weight: 115 lb (52.2 kg)  Height: '5\' 4"'$  (1.626 m)      Body mass index is 19.74 kg/m.    Physical Exam:     General: awake, alert, and oriented no acute distress,  nontoxic Skin: no suspicious lesions or rashes Neuro:sensation intact distally with no dificits, normal muscle tone, no atrophy, strength 5/5 in all tested lower ext groups Psych: normal mood and affect, speech clear   Left hip: No deformity, swelling or wasting ROM Flexion 90, ext 20, IR 30, ER 35 TTP mild greater trochanter, moderate IT band, mild gluteal musculature NTTP over the hip flexors,   si joint, lumbar spine Negative log roll with FROM Positive FABER Positive FADIR Positive piriformis test   Gait normal    Electronically signed by:  Ellen [name] D.[name] 11:40 AM 12/30/21

## 2021-12-24 ENCOUNTER — Ambulatory Visit (INDEPENDENT_AMBULATORY_CARE_PROVIDER_SITE_OTHER): Payer: Medicare Other | Admitting: Podiatry

## 2021-12-24 DIAGNOSIS — B070 Plantar wart: Secondary | ICD-10-CM

## 2021-12-24 NOTE — Progress Notes (Signed)
   Chief Complaint  Patient presents with   Plantar Warts    4 week follow-up plantar warts.patient states that the warts are much better.    Subjective: 81 y.o. female presenting today for follow-up evaluation of a plantar verruca to the right foot.  Patient states that she is doing much better.  She has been applying the salicylic acid as instructed.  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   Dermatology: The plantar verruca to the plantar aspect of the right foot appears to be resolved.  No skin lesion or verruca tissue noted.   Vascular: Palpable pedal pulses bilaterally. No edema or erythema noted. Capillary refill within normal limits.   Neurological: Epicritic and protective threshold grossly intact bilaterally.    Musculoskeletal Exam: No pedal deformity noted   Assessment: #1 plantar wart right foot #2 pain in right foot     Plan of Care:  #1 Patient was evaluated. #2  Light debridement of the hyperkeratotic callus to the plantar aspect of the foot was performed.  The patient felt significant relief.  It appears the plantar verruca to the right foot is resolved #3 advised against going barefoot.  Recommend good supportive shoes and sneakers #4 patient may discontinue the salicylic acid.  Only use as needed #5 return to clinic as needed     Edrick Kins, DPM [facility]  Dr. Edrick Kins, DPM    [address], [place] [address]                Office [phone]  Fax [phone]

## 2021-12-30 ENCOUNTER — Ambulatory Visit (INDEPENDENT_AMBULATORY_CARE_PROVIDER_SITE_OTHER): Payer: Medicare Other | Admitting: Sports Medicine

## 2021-12-30 ENCOUNTER — Ambulatory Visit: Payer: Self-pay

## 2021-12-30 VITALS — BP 130/84 | HR 67 | Ht 64.0 in | Wt 115.0 lb

## 2021-12-30 DIAGNOSIS — M25552 Pain in left hip: Secondary | ICD-10-CM

## 2021-12-30 DIAGNOSIS — M1612 Unilateral primary osteoarthritis, left hip: Secondary | ICD-10-CM

## 2021-12-30 NOTE — Patient Instructions (Addendum)
Good to see you  May continue HEP and PT Tylenol as needed  3 week follow up

## 2022-01-07 ENCOUNTER — Telehealth: Payer: Self-pay | Admitting: Family Medicine

## 2022-01-07 ENCOUNTER — Other Ambulatory Visit: Payer: Self-pay | Admitting: *Deleted

## 2022-01-07 MED ORDER — LOSARTAN POTASSIUM 50 MG PO TABS
ORAL_TABLET | ORAL | 1 refills | Status: DC
Start: 2022-01-07 — End: 2022-06-29

## 2022-01-07 MED ORDER — HYDROCHLOROTHIAZIDE 12.5 MG PO CAPS: ORAL_CAPSULE | ORAL | 1 refills | Status: DC

## 2022-01-07 NOTE — Telephone Encounter (Signed)
  LAST APPOINTMENT DATE:  12/01/21  NEXT APPOINTMENT DATE: 07/16/2022  MEDICATION: hydrochlorothiazide (MICROZIDE) 12.5 MG capsule losartan (COZAAR) 50 MG tablet   Is the patient out of medication? Yes  PHARMACY: [id number] [place], [place] AT [place]  [address] [place] [address]  Phone:  [phone]  Fax:  [phone]  DEA #:  [id number]  Patient states: -Pharmacy informed her a week ago she would need authorization from PCP to have refilled.  - She was told by pharmacy that they sent a request, but none was sent.

## 2022-01-07 NOTE — Telephone Encounter (Signed)
Rx send to pharmacy  

## 2022-01-16 NOTE — Progress Notes (Unsigned)
    [name] D.Randlett Waggaman Phone: [phone]   Assessment and Plan:     There are no diagnoses linked to this encounter.  ***   Pertinent previous records reviewed include ***   Follow Up: ***     //SubjectiveNeysa Bonito, am serving as a Education administrator for Doctor [name]   Chief Complaint: left hip pain    HPI:    10/21/2021 Patient is a 81 year old female complaining of left hip pain. 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   Current Outpatient Medications:    estrogen, conjugated,-medroxyprogesterone (PREMPRO) 0.45-1.5 MG tablet, Take 1 tablet by mouth daily., Disp: 28 tablet, Rfl: 0   hydrochlorothiazide (MICROZIDE) 12.5 MG capsule, TAKE 1 CAPSULE(12.5 MG) BY MOUTH DAILY, Disp: 90 capsule, Rfl: 1   losartan (COZAAR) 50 MG tablet, TAKE 1 TABLET(50 MG) BY MOUTH DAILY, Disp: 90 tablet, Rfl: 1   triamcinolone cream (KENALOG) 0.5 %, Mix 1:1 with lotion and apply twice daily for 1-2 weeks., Disp: 454 g, Rfl: 0   Objective:     There were no vitals filed for this visit.    There is no height or weight on file to calculate BMI.    Physical Exam:    ***   Electronically signed by:   [name] D.[name] 7:50 AM 01/16/22

## 2022-01-20 ENCOUNTER — Ambulatory Visit (INDEPENDENT_AMBULATORY_CARE_PROVIDER_SITE_OTHER): Payer: Medicare Other | Admitting: Sports Medicine

## 2022-01-20 VITALS — BP 100/80 | HR 68 | Ht 64.0 in | Wt 113.0 lb

## 2022-01-20 DIAGNOSIS — M25552 Pain in left hip: Secondary | ICD-10-CM

## 2022-01-20 DIAGNOSIS — M1612 Unilateral primary osteoarthritis, left hip: Secondary | ICD-10-CM | POA: Diagnosis not present

## 2022-01-20 NOTE — Patient Instructions (Signed)
Good to see you MRI left hip  PT referral  Follow up 3 days after

## 2022-02-03 ENCOUNTER — Other Ambulatory Visit: Payer: Self-pay | Admitting: *Deleted

## 2022-02-03 MED ORDER — PREMPRO 0.45-1.5 MG PO TABS: 1.0000 | ORAL_TABLET | Freq: Every day | ORAL | 0 refills | Status: DC

## 2022-02-09 ENCOUNTER — Encounter: Payer: Self-pay | Admitting: *Deleted

## 2022-02-09 ENCOUNTER — Ambulatory Visit
Admission: RE | Admit: 2022-02-09 | Discharge: 2022-02-09 | Disposition: A | Discharge status: Home / Self Care | Payer: Medicare Other | Source: Ambulatory Visit | Attending: Sports Medicine | Admitting: Sports Medicine

## 2022-02-09 DIAGNOSIS — M25552 Pain in left hip: Secondary | ICD-10-CM | POA: Diagnosis not present

## 2022-02-09 DIAGNOSIS — M1612 Unilateral primary osteoarthritis, left hip: Secondary | ICD-10-CM

## 2022-02-12 ENCOUNTER — Ambulatory Visit (INDEPENDENT_AMBULATORY_CARE_PROVIDER_SITE_OTHER): Payer: Medicare Other | Admitting: Sports Medicine

## 2022-02-12 VITALS — BP 120/80 | HR 69 | Ht 64.0 in | Wt 117.0 lb

## 2022-02-12 DIAGNOSIS — S76012D Strain of muscle, fascia and tendon of left hip, subsequent encounter: Secondary | ICD-10-CM

## 2022-02-12 DIAGNOSIS — M25552 Pain in left hip: Secondary | ICD-10-CM

## 2022-02-12 DIAGNOSIS — M1612 Unilateral primary osteoarthritis, left hip: Secondary | ICD-10-CM | POA: Diagnosis not present

## 2022-02-12 NOTE — Progress Notes (Signed)
Ellen [name] D.Ellen [name] Phone: [phone]   Assessment and Plan:     1. Left hip pain 2. Primary osteoarthritis of left hip 3. Strain of gluteus medius of left lower extremity, subsequent encounter -Chronic with exacerbation, subsequent visit - Continued left leg pain and left anterior thigh pain likely from overuse that only received mild temporary relief from intra-articular CSI at office visit on 12/30/2021 - Reviewed MRI with patient in room which showed moderate bilateral femoral acetabular osteoarthritis, worse on left compared to right.  Also showed bilateral gluteus medius tendinosis with edema - Patient's findings on physical exam is not consistent with any finding on MRI.  Patient's pain is primarily groin and anterior thigh similar to pains seen with osteoarthritis versus quadriceps tendinitis, however patient did not improve with intra-articular CSI, and no evidence of quadriceps tendinosis on MRI.  Patient does not have significant pain at this time located to gluteus medius tendons. - We will continue physical therapy for strengthening of hip - Start Tylenol 500 to 1000 mg tablets 2-3 times a day for day-to-day pain relief -May use NSAIDs as needed for breakthrough pain.  Recommend limiting to no more than 1-2 times per week    Pertinent previous records reviewed include left hip MRI on 02/09/2022  Follow Up:  4 to 6 weeks for reevaluation.  Could consider gluteus medius tendon CSI versus ECSWT       Subjective:   I, Ellen [name], am serving as a Education administrator for Doctor [name]   Chief Complaint: left hip pain    HPI:    10/21/2021 Patient is a 81 year old female complaining of left hip pain. 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   Current Outpatient Medications:    estrogen, conjugated,-medroxyprogesterone (PREMPRO) 0.45-1.5 MG tablet, Take 1 tablet by mouth daily., Disp: 28 tablet, Rfl: 0   hydrochlorothiazide (MICROZIDE) 12.5 MG capsule, TAKE 1 CAPSULE(12.5 MG) BY MOUTH DAILY, Disp: 90 capsule, Rfl: 1   losartan (COZAAR) 50 MG tablet, TAKE 1 TABLET(50 MG) BY MOUTH DAILY, Disp: 90 tablet, Rfl: 1   triamcinolone cream (KENALOG) 0.5 %, Mix 1:1 with lotion and apply twice daily for 1-2 weeks., Disp: 454 g, Rfl: 0   Objective:     Vitals:   02/12/22 1250  BP: 120/80  Pulse: 69  SpO2: 96%  Weight: 117 lb (53.1 kg)  Height: '5\' 4"'$  (6.387 m)      Body mass index is 20.08 kg/m.    Physical Exam:  General: awake, alert, and oriented no acute distress, nontoxic Skin: no suspicious lesions or rashes Neuro:sensation intact distally with no dificits, normal muscle tone, no atrophy, strength 5/5 in all tested lower ext groups Psych: normal mood and affect, speech clear   Left hip: No deformity, swelling or wasting ROM Flexion 90, ext 20, IR 30, ER 35 TTP mild greater trochanter, moderate IT band, mild gluteal musculature, moderate hip flexors NTTP   si joint, lumbar spine Negative log roll with FROM Positive FABER Positive FADIR negative piriformis test   Gait  normal    Electronically signed by:  Ellen [name] D.[name] 1:06 PM 02/12/22

## 2022-02-12 NOTE — Patient Instructions (Addendum)
Good to see you Continue PT  Tylenol [phone] mg 2-3 times a day for pain relief  May use ibuprofen 1-2 times per week maximum for breakthrough pain  4-6 week follow up

## 2022-02-16 ENCOUNTER — Encounter: Payer: Self-pay | Admitting: Physical Therapy

## 2022-02-16 ENCOUNTER — Ambulatory Visit (INDEPENDENT_AMBULATORY_CARE_PROVIDER_SITE_OTHER): Payer: Medicare Other | Admitting: Physical Therapy

## 2022-02-16 DIAGNOSIS — M5416 Radiculopathy, lumbar region: Secondary | ICD-10-CM

## 2022-02-16 DIAGNOSIS — M25552 Pain in left hip: Secondary | ICD-10-CM | POA: Diagnosis not present

## 2022-02-16 NOTE — Therapy (Addendum)
OUTPATIENT PHYSICAL THERAPY LOWER EXTREMITY EVALUATION   Patient Name: Ellen [name] MRN: [id number] DOB:[birth year], 81 y.o., female Today's Date: 02/16/2022   PT End of Session - 02/16/22 0846     Visit Number 1    Number of Visits 16    Date for PT Re-Evaluation 04/13/22    Authorization Type Medicare:  16 visits prior this year.    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   Left Buccal Cheek (Keratoacanthoma)   Past Surgical History:  Procedure Laterality Date   BOWEL RESECTION  07/09/2013   Procedure: SMALL BOWEL RESECTION;  Surgeon: Earnstine Regal, MD;  Location: [facility];  Service: General;;   LAPAROTOMY N/A 07/09/2013   Procedure: EXPLORATORY LAPAROTOMY;  Surgeon: Earnstine Regal, MD;  Location: [facility];  Service: General;  Laterality: N/A;   Patient Active Problem List   Diagnosis Date Noted   Osteoarthritis 12/01/2021   Plantar wart 12/01/2021   Ventral hernia 01/01/2021   Diverticulosis 01/05/2019   Dermatitis 40/98/1191   Systolic murmur 47/82/9562   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 Was seen for 16 visits. Most pain in AM, used  to be better during the day, but now some days it still hurts all day. Can have some days with less pain during the day. Is using percussion gun, likes it. Is still doing HEP, doesn't think it helps much. Likes heat.  No pain in foot/callous, has been using cream from podiatrist.   Currently: Most pain in ant/lateral hip/thigh area. States no back pain. Pain she has previously in hamstring is improved.   Previous history: No injury to report, but pt does lots of gardening. For herself, and grounds for a 7 acre church.   PERTINENT HISTORY: Hiatal hernia, R foot pain, HTN  PAIN:  Are you having pain? Yes: NPRS scale: 8 in am /10 Pain location: L back into Leg,    Pain description: pain, radiaitng pain Aggravating factors: first thing in am (worse after increased activity the day prior) Relieving factors: activity/movement.   PRECAUTIONS: None  WEIGHT BEARING RESTRICTIONS No  FALLS:  Has patient fallen in last 6 months? No  PLOF: Independent  PATIENT GOALS   Decreased pain in L back, hip, leg, R foot.    OBJECTIVE:    COGNITION:  Overall cognitive status: Within functional limits for tasks assessed   PALPATION: Pain and tenderness in L Gr troch, TFL and ant/ lateral thigh. No pain in posterior thigh. Tenderness in glute min with deep palpation and manual today.  Special Tests:  SLS on L hip painful;  No anterior hip pain with full hip flexion,  Neg SLR,  Neg Fadir,  pain with faber position in L hip and glute.   LE ROM: Lumbar: ROM: flexion: mild limitation, Ext: min/mod limitation, SB: WFL Hip ROM: mild limitation for ER bil. with pain on L,   L knee: mild limitation for full flexion on L.    LE MMT:  Hip flex: 4/5,  Hip abd: 4 to 4-/5   TODAY'S TREATMENT: Ther ex: reviewed for HEP: hip IR and ER stretches in supine,  S/L clams  Manual: DTM/TPR to L glute med, min   PATIENT EDUCATION:  Education details: PT POC, Exam findings, HEP Person educated:  Patient Education method: Explanation, Demonstration, Tactile cues, Verbal cues, and Handouts Education comprehension: verbalized understanding, returned demonstration, verbal cues required, tactile cues required, and needs further education   HOME EXERCISE PROGRAM: Access Code: [email or web address] Updated 02/16/22     ASSESSMENT:  CLINICAL IMPRESSION:  Pt presents with primary complaint of ongoing pain in L hip.  She has increased tenderness and tightness in glute, as well as gr troc and into anterior thigh. She seems to be having more pain in ant/lateral hip/thigh vs posteriorly from glutes. She also has weakness with L hip abd and increased pain and difficulty with standing/loading on L in L hip. She continues to have ongoing difficulty with  functional activities, and will benefit from skilled PT to improve deficits and pain.  Pt was seen previously this year for similar issues. Back, hamstring and foot pain seem to be better at this time. She will require skilled care to improve hip pain, KX modifier will be used .    OBJECTIVE IMPAIRMENTS Abnormal gait, decreased activity tolerance, decreased balance, decreased knowledge of use of DME, decreased mobility, difficulty walking, decreased ROM, decreased strength, hypomobility, increased muscle spasms, impaired flexibility, improper body mechanics, and pain.   ACTIVITY LIMITATIONS cleaning, community activity, meal prep, occupation, yard work, and shopping.   PERSONAL FACTORS Time since onset of injury/illness/exacerbation are also affecting patient's functional outcome.    REHAB POTENTIAL: Good  CLINICAL DECISION MAKING: Stable/uncomplicated  EVALUATION COMPLEXITY: Low   GOALS: Goals reviewed with patient? Yes  SHORT TERM GOALS: Target date: 03/02/22  Pt to be independent with initial/updated HEP  Goal status: INITIAL  2.  Pt to report decreased tenderness in L thigh to 5/10 in AM.   Goal status: INITIAL     LONG TERM GOALS:  Target date: 04/13/22  Pt to be independent with final HEP  Goal status: INITIAL  2.  Pt to report decreased pain in L hip to 0-2/10 in AM and with activity   Goal status: INITIAL  3.  Pt to demo ability for SLS/loading on L LE, without pain greater than 2/10 to improve ability for functional activity and IADLS.   Goal status: INITIAL  4.  Pt to demo improved hip strength to be at least 4+/5 for all motions, to improve stability and pain.   Goal status: INITIAL    PLAN: PT FREQUENCY: 1-2x/week  PT DURATION: 8 weeks  PLANNED INTERVENTIONS: Therapeutic exercises, Therapeutic activity, Neuromuscular re-education, Balance training, Gait training, Patient/Family education, Joint manipulation, Joint mobilization, Stair training, DME instructions, Dry Needling, Electrical stimulation, Spinal manipulation, Spinal mobilization, Cryotherapy, Moist heat, Taping, Traction, Ultrasound, Ionotophoresis '4mg'$ /ml Dexamethasone, and Manual therapy  PLAN FOR NEXT SESSION:   Lyndee Hensen, PT, DPT 8:47 AM  02/16/22

## 2022-02-23 ENCOUNTER — Encounter: Payer: Self-pay | Admitting: Physical Therapy

## 2022-02-23 ENCOUNTER — Ambulatory Visit (INDEPENDENT_AMBULATORY_CARE_PROVIDER_SITE_OTHER): Payer: Medicare Other | Admitting: Physical Therapy

## 2022-02-23 DIAGNOSIS — M25552 Pain in left hip: Secondary | ICD-10-CM | POA: Diagnosis not present

## 2022-02-23 DIAGNOSIS — M5416 Radiculopathy, lumbar region: Secondary | ICD-10-CM

## 2022-02-23 NOTE — Therapy (Signed)
OUTPATIENT PHYSICAL THERAPY TREATMENT NOTE   Patient Name: Ellen [name] MRN: [id number] DOB:1940-12-21, 81 y.o., female Today's Date: 02/23/2022    END OF SESSION:   PT End of Session - 02/23/22 0934     Visit Number 2    Number of Visits 16    Date for PT Re-Evaluation 04/13/22    Authorization Type Medicare:  16 visits prior this year.    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   Left Buccal Cheek (Keratoacanthoma)   Past Surgical History:  Procedure Laterality Date   BOWEL RESECTION  07/09/2013   Procedure: SMALL BOWEL RESECTION;  Surgeon: Earnstine Regal, MD;  Location: [facility];  Service: General;;   LAPAROTOMY N/A 07/09/2013   Procedure: EXPLORATORY LAPAROTOMY;  Surgeon: Earnstine Regal, MD;  Location: [facility];  Service: General;  Laterality: N/A;   Patient Active Problem List   Diagnosis Date Noted   Osteoarthritis 12/01/2021   Plantar wart 12/01/2021   Ventral hernia 01/01/2021   Diverticulosis 01/05/2019   Dermatitis 40/02/2724   Systolic murmur 36/64/4034   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   Hyperglycemia 10/05/2017   Leg edema 10/05/2017   Hammer toe 10/05/2017   Hot flashes due to menopause 10/05/2017   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  States that her percussion gun and heat.   Eval: Pt last seen in  March-July for L LE pain. Was seen for 16 visits. Most pain in AM, used to be better during the day, but now some days it still hurts all day. Can have some days with less pain during the day. Is using percussion gun, likes it. Is still doing HEP, doesn't think it helps much. Likes heat.  No pain in foot/callous, has been using cream from podiatrist.   Currently: Most pain in ant/lateral hip/thigh area. States no back pain. Pain she has previously in hamstring is improved.    Previous history: No injury to report, but pt does lots of gardening. For herself, and grounds for a 7 acre church.    PERTINENT HISTORY: Hiatal hernia, R foot pain, HTN   PAIN:  Are you having pain? Yes: NPRS scale: 7/10 Pain location: L back into Leg,    Pain description: pain, radiaitng pain Aggravating factors: first thing in am (worse after increased activity the day prior) Relieving factors: activity/movement.    PRECAUTIONS: None   WEIGHT BEARING RESTRICTIONS No   FALLS:  Has patient fallen in last 6 months? No   PLOF: Independent   PATIENT GOALS   Decreased pain in L back, hip, leg, R foot.      OBJECTIVE:      COGNITION:  Overall cognitive status: Within functional limits for tasks assessed      PALPATION: Pain and tenderness in L Gr troch, TFL and ant/ lateral thigh. No pain in posterior thigh. Tenderness in glute min with deep palpation and manual today.      Special Tests:  SLS on L hip painful;  No anterior hip pain with full hip flexion,  Neg SLR,  Neg Fadir,  pain with faber position in L hip and glute.    LE ROM: Lumbar: ROM: flexion: mild limitation, Ext: min/mod limitation, SB: WFL Hip ROM: mild limitation for ER bil. with pain on L,   L knee: mild limitation for full flexion on L.      LE MMT:            Hip flex: 4/5,  Hip abd: 4 to 4-/5     TODAY'S TREATMENT: 02/23/2022 Ther ex: DKC ball 3  minutes, LTR 4 minutes, bent knee fall outs 3 minutes, bent knee fall ins 3 minutes, bridges 5 minutes total with rest, review of previous HEP, hip flexor stretch at counter x10 5" holdsB Manual:     Modalities: thermotherapy during supine exercises    PATIENT EDUCATION:  Education details: HEP, on use of vibration for neuropathy. Person educated: Patient Education method: Explanation, Demonstration, Tactile cues, Verbal cues, and Handouts Education comprehension: verbalized understanding, returned demonstration, verbal cues required, tactile cues required, and needs further education     HOME EXERCISE PROGRAM: Access Code: [email or web address] Updated 02/16/22        ASSESSMENT:   CLINICAL IMPRESSION:   02/23/2022  Tolerated session well. Fatigue and cramping in legs with bridge but this resolved with rest. Educated patient on typical presentation of dysfunctional muscle and curret sustained postures with current presentation. Slight dizziness with supine to sit but thi resolved after a few minutes of rest. Added bridge to HEP.  Eval: Pt presents with primary complaint of ongoing pain in L hip.  She has increased tenderness and tightness in glute, as well as gr troc and into anterior thigh. She seems to be having more pain in ant/lateral hip/thigh vs posteriorly from glutes. She also has weakness with L hip abd and increased pain and difficulty with standing/loading on L in L hip. She continues to have ongoing difficulty with  functional activities, and will benefit from skilled PT to improve deficits and pain.  Pt was seen previously this year for similar issues. Back, hamstring and foot pain seem to be better at this time. She will require skilled care to improve hip pain, KX modifier will be used .      OBJECTIVE IMPAIRMENTS Abnormal gait, decreased activity tolerance, decreased balance, decreased knowledge of use of DME, decreased mobility, difficulty walking, decreased ROM, decreased strength,  hypomobility, increased muscle spasms, impaired flexibility, improper body mechanics, and pain.    ACTIVITY LIMITATIONS cleaning, community activity, meal prep, occupation, yard work, and shopping.    PERSONAL FACTORS Time since onset of injury/illness/exacerbation are also affecting patient's functional outcome.      REHAB POTENTIAL: Good   CLINICAL DECISION MAKING: Stable/uncomplicated   EVALUATION COMPLEXITY: Low     GOALS: Goals reviewed with patient? Yes   SHORT TERM GOALS: Target date: 03/02/22   Pt to be independent with initial/updated HEP   Goal status: INITIAL   2.  Pt to report decreased tenderness in L thigh to 5/10 in AM.    Goal status: INITIAL         LONG TERM GOALS:  Target date: 04/13/22   Pt to be independent with final HEP   Goal status: INITIAL   2.  Pt to report decreased pain in L hip to 0-2/10 in AM and with activity    Goal status: INITIAL   3.  Pt to demo ability for SLS/loading on L LE, without pain greater than 2/10 to improve ability for functional activity and IADLS.    Goal status: INITIAL   4.  Pt to demo improved hip strength to be at least 4+/5 for all motions, to improve stability and pain.    Goal status: INITIAL       PLAN: PT FREQUENCY: 1-2x/week   PT DURATION: 8 weeks   PLANNED INTERVENTIONS: Therapeutic exercises, Therapeutic activity, Neuromuscular re-education, Balance training, Gait training, Patient/Family education, Joint manipulation, Joint mobilization, Stair training, DME instructions, Dry Needling, Electrical stimulation, Spinal manipulation, Spinal mobilization, Cryotherapy, Moist heat, Taping, Traction, Ultrasound, Ionotophoresis '4mg'$ /ml Dexamethasone, and Manual therapy   PLAN FOR NEXT SESSION:   10:17 AM, 02/23/22 Jerene Pitch, DPT Physical Therapy with [facility]

## 2022-02-25 ENCOUNTER — Encounter: Payer: Medicare Other | Admitting: Physical Therapy

## 2022-03-02 ENCOUNTER — Ambulatory Visit (INDEPENDENT_AMBULATORY_CARE_PROVIDER_SITE_OTHER): Payer: Medicare Other | Admitting: Physical Therapy

## 2022-03-02 ENCOUNTER — Encounter: Payer: Self-pay | Admitting: Physical Therapy

## 2022-03-02 DIAGNOSIS — M25552 Pain in left hip: Secondary | ICD-10-CM

## 2022-03-02 DIAGNOSIS — M5416 Radiculopathy, lumbar region: Secondary | ICD-10-CM

## 2022-03-02 NOTE — Therapy (Signed)
OUTPATIENT PHYSICAL THERAPY TREATMENT NOTE   Patient Name: Ellen [name] MRN: [id number] DOB:1941-04-19, 81 y.o., female Today's Date: 03/02/2022    END OF SESSION:   PT End of Session - 03/02/22 0952     Visit Number 3    Number of Visits 16    Date for PT Re-Evaluation 04/13/22    Authorization Type Medicare:  16 visits prior this year.    PT Start Time 0932    PT Stop Time 1011    PT Time Calculation (min) 39 min    Activity Tolerance Patient tolerated treatment well    Behavior During Therapy [facility] for tasks assessed/performed              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   Left Buccal Cheek (Keratoacanthoma)   Past Surgical History:  Procedure Laterality Date   BOWEL RESECTION  07/09/2013   Procedure: SMALL BOWEL RESECTION;  Surgeon: Earnstine Regal, MD;  Location: [facility];  Service: General;;   LAPAROTOMY N/A 07/09/2013   Procedure: EXPLORATORY LAPAROTOMY;  Surgeon: Earnstine Regal, MD;  Location: [facility];  Service: General;  Laterality: N/A;   Patient Active Problem List   Diagnosis Date Noted   Osteoarthritis 12/01/2021   Plantar wart 12/01/2021   Ventral hernia 01/01/2021   Diverticulosis 01/05/2019   Dermatitis 19/41/7408   Systolic murmur 14/48/1856   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 Has been doing  stretches. States most pain with standing/bending outdoor work.   Eval: Pt last seen in  March-July for L LE pain. Was seen for 16 visits. Most pain in AM, used to be better during the day, but now some days it still hurts all day. Can have some days with less pain during the day. Is using percussion gun, likes it. Is still doing HEP, doesn't think it helps much. Likes heat.  No pain in foot/callous, has been using cream from podiatrist.   Currently: Most pain in ant/lateral hip/thigh area. States no back pain. Pain she has previously in hamstring is improved.    Previous history: No injury to report, but pt does lots of gardening. For herself, and grounds for a 7 acre church.    PERTINENT HISTORY: Hiatal hernia, R foot pain, HTN   PAIN:  Are you having pain? Yes: NPRS scale: 7/10 Pain location: L back into Leg,    Pain description: pain, radiaitng pain Aggravating factors: first thing in am (worse after increased activity the day prior) Relieving factors: activity/movement.    PRECAUTIONS: None   WEIGHT BEARING RESTRICTIONS No   FALLS:  Has patient fallen in last 6 months? No   PLOF: Independent   PATIENT GOALS   Decreased pain in L back, hip, leg, R foot.      OBJECTIVE:      COGNITION:  Overall cognitive status: Within functional limits for tasks assessed      PALPATION: Pain and tenderness in L Gr troch, TFL and ant/ lateral thigh. No pain in posterior thigh. Tenderness in glute min with deep palpation and manual today.      Special Tests:  SLS on L hip painful;  No anterior hip pain with full hip flexion,  Neg SLR,  Neg Fadir,  pain with faber position in L hip and glute.    LE ROM: Lumbar: ROM: flexion: mild limitation, Ext: min/mod limitation, SB: WFL Hip ROM: mild limitation for ER bil. with pain on L,   L knee: mild limitation for full flexion on L.      LE MMT:            Hip flex: 4/5,  Hip abd: 4 to 4-/5     TODAY'S  TREATMENT: 03/02/2022 Ther ex: SKC 30 sec x 3 bil;  LTR x20 ; bent knee fall outs 3 minutes, , bridges 2x10;  hip flexor stretch off side of table x2  min and standing x1 nin;  Manual:  long leg distraction for L hip;    Modalities: thermotherapy during supine exercises    PATIENT EDUCATION:  Education details: HEP,  Person educated: Patient Education method: Explanation, Demonstration, Tactile cues, Verbal cues, and Handouts Education comprehension: verbalized understanding, returned demonstration, verbal cues required, tactile cues required, and needs further education     HOME EXERCISE PROGRAM: Access Code: [email or web address] Updated 02/16/22        ASSESSMENT:   CLINICAL IMPRESSION:   03/02/2022  Pt with soreness in ant/lateral hip. She has much muscle tension with hip flexor stretching , in quad . After trying in supine and standing, pt has soreness in thigh vs decreased tension. Will continue to assess next visit. Education and practice for fwd bend with optimal mechanics for back, as well as equal weight bearing through L and R. Pt states increased weight bearing on L LE most of the time when bent over doing outdoor work. Reviewed light activity and heat to decrease pain.   Eval: Pt presents with primary complaint of ongoing pain in L hip.  She has increased tenderness and tightness in glute, as well as gr troc and into anterior thigh. She seems to be having more pain in ant/lateral hip/thigh vs posteriorly from glutes. She also has weakness with L hip abd and increased pain and difficulty with standing/loading on L in L hip. She continues to have ongoing difficulty with  functional activities, and will benefit from skilled PT to improve deficits and pain.  Pt was seen previously this year for similar issues. Back, hamstring and foot pain seem to be better at this time. She will require skilled care to improve hip pain, KX modifier will be used .      OBJECTIVE IMPAIRMENTS Abnormal gait,  decreased activity tolerance, decreased balance, decreased knowledge of use of DME, decreased mobility, difficulty walking, decreased ROM, decreased strength, hypomobility, increased muscle spasms, impaired flexibility, improper body mechanics, and pain.    ACTIVITY LIMITATIONS cleaning, community activity, meal prep, occupation, yard work, and shopping.    PERSONAL FACTORS Time since onset of injury/illness/exacerbation are also affecting patient's functional outcome.      REHAB POTENTIAL: Good   CLINICAL DECISION MAKING: Stable/uncomplicated   EVALUATION COMPLEXITY: Low     GOALS: Goals reviewed with patient? Yes   SHORT TERM GOALS: Target date: 03/02/22   Pt to be independent with initial/updated HEP  Goal status: INITIAL   2.  Pt to report decreased tenderness in L thigh to 5/10 in AM.    Goal status: INITIAL         LONG TERM GOALS: Target date: 04/13/22   Pt to be independent with final HEP   Goal status: INITIAL   2.  Pt to report decreased pain in L hip to 0-2/10 in AM and with activity    Goal status: INITIAL   3.  Pt to demo ability for SLS/loading on L LE, without pain greater than 2/10 to improve ability for functional activity and IADLS.    Goal status: INITIAL   4.  Pt to demo improved hip strength to be at least 4+/5 for all motions, to improve stability and pain.    Goal status: INITIAL       PLAN: PT FREQUENCY: 1-2x/week   PT DURATION: 8 weeks   PLANNED INTERVENTIONS: Therapeutic exercises, Therapeutic activity, Neuromuscular re-education, Balance training, Gait training, Patient/Family education, Joint manipulation, Joint mobilization, Stair training, DME instructions, Dry Needling, Electrical stimulation, Spinal manipulation, Spinal mobilization, Cryotherapy, Moist heat, Taping, Traction, Ultrasound, Ionotophoresis '4mg'$ /ml Dexamethasone, and Manual therapy   PLAN FOR NEXT SESSION:   Lyndee Hensen, PT, DPT 12:34 PM  03/02/22

## 2022-03-04 ENCOUNTER — Encounter: Payer: Self-pay | Admitting: Physical Therapy

## 2022-03-04 ENCOUNTER — Ambulatory Visit (INDEPENDENT_AMBULATORY_CARE_PROVIDER_SITE_OTHER): Payer: Medicare Other | Admitting: Physical Therapy

## 2022-03-04 DIAGNOSIS — M25552 Pain in left hip: Secondary | ICD-10-CM

## 2022-03-04 NOTE — Therapy (Signed)
OUTPATIENT PHYSICAL THERAPY TREATMENT NOTE   Patient Name: Ellen [name] MRN: [id number] DOB:January 18, 1941, 81 y.o., female Today's Date: 03/04/2022    END OF SESSION:   PT End of Session - 03/04/22 0933     Visit Number 4    Number of Visits 16    Date for PT Re-Evaluation 04/13/22    Authorization Type Medicare:  16 visits prior this year.    PT Start Time 0935    PT Stop Time 1015    PT Time Calculation (min) 40 min    Activity Tolerance Patient tolerated treatment well    Behavior During Therapy [facility] for tasks assessed/performed              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   Left Buccal Cheek (Keratoacanthoma)   Past Surgical History:  Procedure Laterality Date   BOWEL RESECTION  07/09/2013   Procedure: SMALL BOWEL RESECTION;  Surgeon: Earnstine Regal, MD;  Location: [facility];  Service: General;;   LAPAROTOMY N/A 07/09/2013   Procedure: EXPLORATORY LAPAROTOMY;  Surgeon: Earnstine Regal, MD;  Location: [facility];  Service: General;  Laterality: N/A;   Patient Active Problem List   Diagnosis Date Noted   Osteoarthritis 12/01/2021   Plantar wart 12/01/2021   Ventral hernia 01/01/2021   Diverticulosis 01/05/2019   Dermatitis 69/48/5462   Systolic murmur 70/35/0093   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   Eval: Pt last seen in   March-July for L LE pain. Was seen for 16 visits. Most pain in AM, used to be better during the day, but now some days it still hurts all day. Can have some days with less pain during the day. Is using percussion gun, likes it. Is still doing HEP, doesn't think it helps much. Likes heat.  No pain in foot/callous, has been using cream from podiatrist.   Currently: Most pain in ant/lateral hip/thigh area. States no back pain. Pain she has previously in hamstring is improved.    Previous history: No injury to report, but pt does lots of gardening. For herself, and grounds for a 7 acre church.    PERTINENT HISTORY: Hiatal hernia, R foot pain, HTN   PAIN:  Are you having pain? Yes: NPRS scale: 7/10 Pain location: L back into Leg,    Pain description: pain, radiaitng pain Aggravating factors: first thing in am (worse after increased activity the day prior) Relieving factors: activity/movement.    PRECAUTIONS: None   WEIGHT BEARING RESTRICTIONS No   FALLS:  Has patient fallen in last 6 months? No   PLOF: Independent   PATIENT GOALS   Decreased pain in L back, hip, leg, R foot.      OBJECTIVE:      COGNITION:           Overall cognitive  status: Within functional limits for tasks assessed      PALPATION: Pain and tenderness in L Gr troch, TFL and ant/ lateral thigh. No pain in posterior thigh. Tenderness in glute min with deep palpation and manual today.      Special Tests:  SLS on L hip painful;  No anterior hip pain with full hip flexion,  Neg SLR,  Neg Fadir,  pain with faber position in L hip and glute.    LE ROM: Lumbar: ROM: flexion: mild limitation, Ext: min/mod limitation, SB: WFL Hip ROM: mild limitation for ER bil. with pain on L,   L knee: mild limitation for full flexion on L.      LE MMT:            Hip flex: 4/5,  Hip abd: 4 to 4-/5     TODAY'S TREATMENT: 03/04/2022 Ther ex: SKC 30 sec x 3 bil;  LTR x20 ; bent knee fall outs 3 minutes;  bridges 2x10;  hip  flexor stretch , standing x1 min bil; TA contraction with education on hernia, optimal form,and breathing x 5 min;  Standing: march x 15;  hip abd 2x10 bil;  Manual:  long leg distraction for L hip; STM with tennis ball to L glute med;    Modalities:  thermotherapy during supine exercises at end of session   PATIENT EDUCATION:  Education details: HEP,  Person educated: Patient Education method: Explanation, Demonstration, Tactile cues, Verbal cues, and Handouts Education comprehension: verbalized understanding, returned demonstration, verbal cues required, tactile cues required, and needs further education     HOME EXERCISE PROGRAM: Access Code: [email or web address] Updated 02/16/22        ASSESSMENT:   CLINICAL IMPRESSION:   03/04/2022  Pt with soreness in anterior hip today, with standing march/flexion. Improving ability for weight bearing on L with less pain from prior months. She has good tolerance for light activities and manual. Reviewed standing hip flexor stretch, discussed performing lightly for HEP, but not over doing this if painful.   Eval: Pt presents with primary complaint of ongoing pain in L hip.  She has increased tenderness and tightness in glute, as well as gr troc and into anterior thigh. She seems to be having more pain in ant/lateral hip/thigh vs posteriorly from glutes. She also has weakness with L hip abd and increased pain and difficulty with standing/loading on L in L hip. She continues to have ongoing difficulty with  functional activities, and will benefit from skilled PT to improve deficits and pain.  Pt was seen previously this year for similar issues. Back, hamstring and foot pain seem to be better at this time. She will require skilled care to improve hip pain, KX modifier will be used .      OBJECTIVE IMPAIRMENTS Abnormal gait, decreased activity tolerance, decreased balance, decreased knowledge of use of DME, decreased mobility, difficulty walking, decreased ROM,  decreased strength, hypomobility, increased muscle spasms, impaired flexibility, improper body mechanics, and pain.    ACTIVITY LIMITATIONS cleaning, community activity, meal prep, occupation, yard work, and shopping.    PERSONAL FACTORS Time since onset of injury/illness/exacerbation are also affecting patient's functional outcome.      REHAB POTENTIAL: Good   CLINICAL DECISION MAKING: Stable/uncomplicated   EVALUATION COMPLEXITY: Low     GOALS: Goals reviewed with patient? Yes   SHORT TERM GOALS: Target date: 03/02/22   Pt to be independent with initial/updated HEP   Goal status: INITIAL   2.  Pt to report  decreased tenderness in L thigh to 5/10 in AM.    Goal status: INITIAL         LONG TERM GOALS: Target date: 04/13/22   Pt to be independent with final HEP   Goal status: INITIAL   2.  Pt to report decreased pain in L hip to 0-2/10 in AM and with activity    Goal status: INITIAL   3.  Pt to demo ability for SLS/loading on L LE, without pain greater than 2/10 to improve ability for functional activity and IADLS.    Goal status: INITIAL   4.  Pt to demo improved hip strength to be at least 4+/5 for all motions, to improve stability and pain.    Goal status: INITIAL       PLAN: PT FREQUENCY: 1-2x/week   PT DURATION: 8 weeks   PLANNED INTERVENTIONS: Therapeutic exercises, Therapeutic activity, Neuromuscular re-education, Balance training, Gait training, Patient/Family education, Joint manipulation, Joint mobilization, Stair training, DME instructions, Dry Needling, Electrical stimulation, Spinal manipulation, Spinal mobilization, Cryotherapy, Moist heat, Taping, Traction, Ultrasound, Ionotophoresis '4mg'$ /ml Dexamethasone, and Manual therapy   PLAN FOR NEXT SESSION:   Lyndee Hensen, PT, DPT 12:56 PM  03/04/22

## 2022-03-09 ENCOUNTER — Ambulatory Visit (INDEPENDENT_AMBULATORY_CARE_PROVIDER_SITE_OTHER): Payer: Medicare Other | Admitting: Physical Therapy

## 2022-03-09 ENCOUNTER — Encounter: Payer: Self-pay | Admitting: Physical Therapy

## 2022-03-09 DIAGNOSIS — M5416 Radiculopathy, lumbar region: Secondary | ICD-10-CM | POA: Diagnosis not present

## 2022-03-09 DIAGNOSIS — M25552 Pain in left hip: Secondary | ICD-10-CM

## 2022-03-09 NOTE — Therapy (Signed)
OUTPATIENT PHYSICAL THERAPY TREATMENT NOTE   Patient Name: Ellen [name] MRN: [id number] DOB:10-21-1940, 81 y.o., female Today's Date: 03/09/2022    END OF SESSION:   PT End of Session - 03/09/22 0911     Visit Number 5    Number of Visits 16    Date for PT Re-Evaluation 04/13/22    Authorization Type Medicare:  16 visits prior this year.    PT Start Time 0848    PT Stop Time 0930    PT Time Calculation (min) 42 min    Activity Tolerance Patient tolerated treatment well    Behavior During Therapy [facility] for tasks assessed/performed               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   Left Buccal Cheek (Keratoacanthoma)   Past Surgical History:  Procedure Laterality Date   BOWEL RESECTION  07/09/2013   Procedure: SMALL BOWEL RESECTION;  Surgeon: Earnstine Regal, MD;  Location: [facility];  Service: General;;   LAPAROTOMY N/A 07/09/2013   Procedure: EXPLORATORY LAPAROTOMY;  Surgeon: Earnstine Regal, MD;  Location: [facility];  Service: General;  Laterality: N/A;   Patient Active Problem List   Diagnosis Date Noted   Osteoarthritis 12/01/2021   Plantar wart 12/01/2021   Ventral hernia 01/01/2021   Diverticulosis 01/05/2019   Dermatitis 35/00/9381   Systolic murmur 82/99/3716   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 More sore this am.   Eval: Pt  last seen in  March-July for L LE pain. Was seen for 16 visits. Most pain in AM, used to be better during the day, but now some days it still hurts all day. Can have some days with less pain during the day. Is using percussion gun, likes it. Is still doing HEP, doesn't think it helps much. Likes heat.  No pain in foot/callous, has been using cream from podiatrist.   Currently: Most pain in ant/lateral hip/thigh area. States no back pain. Pain she has previously in hamstring is improved.    Previous history: No injury to report, but pt does lots of gardening. For herself, and grounds for a 7 acre church.    PERTINENT HISTORY: Hiatal hernia, R foot pain, HTN   PAIN:  Are you having pain? Yes: NPRS scale: 7/10 Pain location: L back into Leg,    Pain description: pain, radiaitng pain Aggravating factors: first thing in am (worse after increased activity the day prior) Relieving factors: activity/movement.    PRECAUTIONS: None   WEIGHT BEARING RESTRICTIONS No   FALLS:  Has patient fallen in last 6 months? No   PLOF: Independent   PATIENT GOALS   Decreased pain in L back, hip, leg, R foot.      OBJECTIVE:      COGNITION:  Overall cognitive status: Within functional limits for tasks assessed      PALPATION: Pain and tenderness in L Gr troch, TFL and ant/ lateral thigh. No pain in posterior thigh. Tenderness in glute min with deep palpation and manual today.      Special Tests:  SLS on L hip painful;  No anterior hip pain with full hip flexion,  Neg SLR,  Neg Fadir,  pain with faber position in L hip and glute.    LE ROM: Lumbar: ROM: flexion: mild limitation, Ext: min/mod limitation, SB: WFL Hip ROM: mild limitation for ER bil. with pain on L,   L knee: mild limitation for full flexion on L.      LE MMT:            Hip flex: 4/5,  Hip abd: 4 to 4-/5     TODAY'S TREATMENT: 03/09/2022 Ther ex: SKC 30 sec x 3 bil;  LTR x20 ; bent knee fall outs 3 minutes;  bridges  2x10;  SLR 2x10 bil;  Standing: march x 15;  hip abd 2x10 bil; side stepping (pain) 20 ft x 4; bwd walking 20 ft x 4;  Manual:  long leg distraction for L hip; manual hip ER stretching   Modalities:  thermotherapy during supine exercises    PATIENT EDUCATION:  Education details: HEP,  Person educated: Patient Education method: Explanation, Demonstration, Tactile cues, Verbal cues, and Handouts Education comprehension: verbalized understanding, returned demonstration, verbal cues required, tactile cues required, and needs further education     HOME EXERCISE PROGRAM: Access Code: [email or web address] Updated 02/16/22        ASSESSMENT:   CLINICAL IMPRESSION:   03/09/2022  Pt with good tolerance for supine exercises. She has decreased pain with heat and more passive activity, stretching and supine activity. She has mild soreness with standing ther ex, and standing on L LE. Continued Discussions on  dong light strength and activity and trying not to over do activity during the day. Plan to progress activity as tolerated.    Eval: Pt presents with primary complaint of ongoing pain in L hip.  She has increased tenderness and tightness in glute, as well as gr troc and into anterior thigh. She seems to be having more pain in ant/lateral hip/thigh vs posteriorly from glutes. She also has weakness with L hip abd and increased pain and difficulty with standing/loading on L in L hip. She continues to have ongoing difficulty with  functional activities, and will benefit from skilled PT to improve deficits and pain.  Pt was seen previously this year for similar issues. Back, hamstring and foot pain seem to be better at this time. She will require skilled care to improve hip pain, KX modifier will be used .      OBJECTIVE IMPAIRMENTS Abnormal gait, decreased activity tolerance, decreased balance, decreased knowledge of use of DME, decreased mobility, difficulty walking, decreased ROM, decreased strength,  hypomobility, increased muscle spasms, impaired flexibility, improper body mechanics, and pain.    ACTIVITY LIMITATIONS cleaning, community activity, meal prep, occupation, yard work, and shopping.    PERSONAL FACTORS Time since onset of injury/illness/exacerbation are also affecting patient's functional outcome.      REHAB POTENTIAL: Good   CLINICAL DECISION MAKING: Stable/uncomplicated   EVALUATION COMPLEXITY: Low     GOALS: Goals reviewed with patient? Yes   SHORT TERM GOALS: Target date: 03/02/22   Pt to be independent with initial/updated HEP   Goal status: INITIAL   2.  Pt to  report decreased tenderness in L thigh to 5/10 in AM.    Goal status: INITIAL         LONG TERM GOALS: Target date: 04/13/22   Pt to be independent with final HEP   Goal status: INITIAL   2.  Pt to report decreased pain in L hip to 0-2/10 in AM and with activity    Goal status: INITIAL   3.  Pt to demo ability for SLS/loading on L LE, without pain greater than 2/10 to improve ability for functional activity and IADLS.    Goal status: INITIAL   4.  Pt to demo improved hip strength to be at least 4+/5 for all motions, to improve stability and pain.    Goal status: INITIAL       PLAN: PT FREQUENCY: 1-2x/week   PT DURATION: 8 weeks   PLANNED INTERVENTIONS: Therapeutic exercises, Therapeutic activity, Neuromuscular re-education, Balance training, Gait training, Patient/Family education, Joint manipulation, Joint mobilization, Stair training, DME instructions, Dry Needling, Electrical stimulation, Spinal manipulation, Spinal mobilization, Cryotherapy, Moist heat, Taping, Traction, Ultrasound, Ionotophoresis '4mg'$ /ml Dexamethasone, and Manual therapy   PLAN FOR NEXT SESSION:   Lyndee Hensen, PT, DPT 4:00 PM  03/09/22

## 2022-03-11 ENCOUNTER — Ambulatory Visit (INDEPENDENT_AMBULATORY_CARE_PROVIDER_SITE_OTHER): Payer: Medicare Other | Admitting: Physical Therapy

## 2022-03-11 ENCOUNTER — Encounter: Payer: Self-pay | Admitting: Physical Therapy

## 2022-03-11 DIAGNOSIS — M25552 Pain in left hip: Secondary | ICD-10-CM

## 2022-03-11 DIAGNOSIS — M5416 Radiculopathy, lumbar region: Secondary | ICD-10-CM

## 2022-03-11 NOTE — Therapy (Signed)
OUTPATIENT PHYSICAL THERAPY TREATMENT NOTE   Patient Name: Ellen [name] MRN: [id number] DOB:09/03/1940, 81 y.o., female Today's Date: 03/11/2022    END OF SESSION:   PT End of Session - 03/11/22 0931     Visit Number 6    Number of Visits 16    Date for PT Re-Evaluation 04/13/22    Authorization Type Medicare:  16 visits prior this year.    PT Start Time 0932    PT Stop Time 1015    PT Time Calculation (min) 43 min    Activity Tolerance Patient tolerated treatment well    Behavior During Therapy [facility] for tasks assessed/performed                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   Left Buccal Cheek (Keratoacanthoma)   Past Surgical History:  Procedure Laterality Date   BOWEL RESECTION  07/09/2013   Procedure: SMALL BOWEL RESECTION;  Surgeon: Earnstine Regal, MD;  Location: [facility];  Service: General;;   LAPAROTOMY N/A 07/09/2013   Procedure: EXPLORATORY LAPAROTOMY;  Surgeon: Earnstine Regal, MD;  Location: [facility];  Service: General;  Laterality: N/A;   Patient Active Problem List   Diagnosis Date Noted   Osteoarthritis 12/01/2021   Plantar wart 12/01/2021   Ventral hernia 01/01/2021   Diverticulosis 01/05/2019   Dermatitis 02/58/5277   Systolic murmur 82/42/3536   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 She worked for 5 hours  yesterday and had no pain in the afternoon. Notes more pain with bending.   Eval: Pt last seen in  March-July for L LE pain. Was seen for 16 visits. Most pain in AM, used to be better during the day, but now some days it still hurts all day. Can have some days with less pain during the day. Is using percussion gun, likes it. Is still doing HEP, doesn't think it helps much. Likes heat.  No pain in foot/callous, has been using cream from podiatrist.   Currently: Most pain in ant/lateral hip/thigh area. States no back pain. Pain she has previously in hamstring is improved.    Previous history: No injury to report, but pt does lots of gardening. For herself, and grounds for a 7 acre church.    PERTINENT HISTORY: Hiatal hernia, R foot pain, HTN   PAIN:  Are you having pain? Yes: NPRS scale: 7/10 Pain location: L back into Leg,    Pain description: pain, radiaitng pain Aggravating factors: first thing in am (worse after increased activity the day prior) Relieving factors: activity/movement.    PRECAUTIONS: None   WEIGHT BEARING RESTRICTIONS No   FALLS:  Has patient fallen in last 6 months? No   PLOF: Independent   PATIENT GOALS   Decreased pain in L back, hip, leg, R foot.      OBJECTIVE:  COGNITION:           Overall cognitive status: Within functional limits for tasks assessed      PALPATION: Pain and tenderness in L Gr troch, TFL and ant/ lateral thigh. No pain in posterior thigh. Tenderness in glute min with deep palpation and manual today.      Special Tests:  SLS on L hip painful;  No anterior hip pain with full hip flexion,  Neg SLR,  Neg Fadir,  pain with faber position in L hip and glute.    LE ROM: Lumbar: ROM: flexion: mild limitation, Ext: min/mod limitation, SB: WFL Hip ROM: mild limitation for ER bil. with pain on L,   L knee: mild limitation for full flexion on L.      LE MMT:            Hip flex: 4/5,  Hip abd: 4 to 4-/5     TODAY'S  TREATMENT:  03/11/2022  Ther ex: SKC 30 sec x 3 bil;  LTR x20 ; bent knee fall outs 3 minutes;  bridges 2x10;  SLR 2x10 bil;  Standing: march x 15;  hip abd 2x10 bil;  Side glides to R x 10, L SB x 15 with manual assist.  S/L: hip abd 2x10 on L, manual quad stretch in s/l.  Manual:  long leg distraction for L hip; manual hip IR/ER stretching, manual Hip flexor stretch in supine- off side of table.    Modalities:  thermotherapy during supine exercises      PATIENT EDUCATION:  Education details: HEP,  Person educated: Patient Education method: Explanation, Demonstration, Tactile cues, Verbal cues, and Handouts Education comprehension: verbalized understanding, returned demonstration, verbal cues required, tactile cues required, and needs further education     HOME EXERCISE PROGRAM: Access Code: [email or web address] Updated 02/16/22        ASSESSMENT:   CLINICAL IMPRESSION:   03/11/2022  Pt  with noted increase in thigh pain with L lumbar SB today. Also still having pain with loading of L leg/back, with SLS. Will continue to work on this next visit, as well as possibly try heel lift for lumbar/thoracic posture asymmetry.    Eval: Pt presents with primary complaint of ongoing pain in L hip.  She has increased tenderness and tightness in glute, as well as gr troc and into anterior thigh. She seems to be having more pain in ant/lateral hip/thigh vs posteriorly from glutes. She also has weakness with L hip abd and increased pain and difficulty with standing/loading on L in L hip. She continues to have ongoing difficulty with  functional activities, and will benefit from skilled PT to improve deficits and pain.  Pt was seen previously this year for similar issues. Back, hamstring and foot pain seem to be better at this time. She will require skilled care to improve hip pain, KX modifier will be used .      OBJECTIVE IMPAIRMENTS Abnormal gait, decreased activity tolerance, decreased balance, decreased  knowledge of use of DME, decreased mobility, difficulty walking, decreased ROM, decreased strength, hypomobility, increased muscle spasms, impaired flexibility, improper body mechanics, and pain.    ACTIVITY LIMITATIONS cleaning, community activity, meal prep, occupation, yard work, and shopping.    PERSONAL FACTORS Time since onset of injury/illness/exacerbation are also affecting patient's functional outcome.      REHAB POTENTIAL: Good   CLINICAL DECISION MAKING: Stable/uncomplicated   EVALUATION COMPLEXITY: Low     GOALS: Goals reviewed with patient? Yes   SHORT TERM GOALS:  Target date: 03/02/22   Pt to be independent with initial/updated HEP   Goal status: INITIAL   2.  Pt to report decreased tenderness in L thigh to 5/10 in AM.    Goal status: INITIAL         LONG TERM GOALS: Target date: 04/13/22   Pt to be independent with final HEP   Goal status: INITIAL   2.  Pt to report decreased pain in L hip to 0-2/10 in AM and with activity    Goal status: INITIAL   3.  Pt to demo ability for SLS/loading on L LE, without pain greater than 2/10 to improve ability for functional activity and IADLS.    Goal status: INITIAL   4.  Pt to demo improved hip strength to be at least 4+/5 for all motions, to improve stability and pain.    Goal status: INITIAL       PLAN: PT FREQUENCY: 1-2x/week   PT DURATION: 8 weeks   PLANNED INTERVENTIONS: Therapeutic exercises, Therapeutic activity, Neuromuscular re-education, Balance training, Gait training, Patient/Family education, Joint manipulation, Joint mobilization, Stair training, DME instructions, Dry Needling, Electrical stimulation, Spinal manipulation, Spinal mobilization, Cryotherapy, Moist heat, Taping, Traction, Ultrasound, Ionotophoresis '4mg'$ /ml Dexamethasone, and Manual therapy   PLAN FOR NEXT SESSION: try heel lift on R;   Lyndee Hensen, PT, DPT 9:31 AM  03/11/22

## 2022-03-16 ENCOUNTER — Encounter: Payer: Self-pay | Admitting: Physical Therapy

## 2022-03-16 ENCOUNTER — Ambulatory Visit (INDEPENDENT_AMBULATORY_CARE_PROVIDER_SITE_OTHER): Payer: Medicare Other | Admitting: Physical Therapy

## 2022-03-16 DIAGNOSIS — M25552 Pain in left hip: Secondary | ICD-10-CM | POA: Diagnosis not present

## 2022-03-16 DIAGNOSIS — M5416 Radiculopathy, lumbar region: Secondary | ICD-10-CM | POA: Diagnosis not present

## 2022-03-16 NOTE — Therapy (Signed)
OUTPATIENT PHYSICAL THERAPY TREATMENT NOTE   Patient Name: Ellen [name] MRN: [id number] DOB:27-Feb-1941, 81 y.o., female Today's Date: 03/16/2022    END OF SESSION:   PT End of Session - 03/16/22 1025     Visit Number 7    Number of Visits 16    Date for PT Re-Evaluation 04/13/22    Authorization Type Medicare:  16 visits prior this year.    PT Start Time 0930    PT Stop Time 1015    PT Time Calculation (min) 45 min    Activity Tolerance Patient tolerated treatment well    Behavior During Therapy [facility] for tasks assessed/performed                 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   Left Buccal Cheek (Keratoacanthoma)   Past Surgical History:  Procedure Laterality Date   BOWEL RESECTION  07/09/2013   Procedure: SMALL BOWEL RESECTION;  Surgeon: Earnstine Regal, MD;  Location: [facility];  Service: General;;   LAPAROTOMY N/A 07/09/2013   Procedure: EXPLORATORY LAPAROTOMY;  Surgeon: Earnstine Regal, MD;  Location: [facility];  Service: General;  Laterality: N/A;   Patient Active Problem List   Diagnosis Date Noted   Osteoarthritis 12/01/2021   Plantar wart 12/01/2021   Ventral hernia 01/01/2021   Diverticulosis 01/05/2019   Dermatitis 97/35/3299   Systolic murmur 24/26/8341   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 Has not been able to stretch yet  this am.   Eval: Pt last seen in  [place] for L LE pain. Was seen for 16 visits. Most pain in AM, used to be better during the day, but now some days it still hurts all day. Can have some days with less pain during the day. Is using percussion gun, likes it. Is still doing HEP, doesn't think it helps much. Likes heat.  No pain in foot/callous, has been using cream from podiatrist.   Currently: Most pain in ant/lateral hip/thigh area. States no back pain. Pain she has previously in hamstring is improved.    Previous history: No injury to report, but pt does lots of gardening. For herself, and grounds for a 7 acre church.    PERTINENT HISTORY: Hiatal hernia, R foot pain, HTN   PAIN:  Are you having pain? Yes: NPRS scale: 7/10 Pain location: L back into Leg,    Pain description: pain, radiaitng pain Aggravating factors: first thing in am (worse after increased activity the day prior) Relieving factors: activity/movement.    PRECAUTIONS: None   WEIGHT BEARING RESTRICTIONS No   FALLS:  Has patient fallen in last 6 months? No   PLOF: Independent   PATIENT GOALS   Decreased pain in L back, hip, leg, R foot.      OBJECTIVE:      COGNITION:  Overall cognitive status: Within functional limits for tasks assessed      PALPATION: Pain and tenderness in L Gr troch, TFL and ant/ lateral thigh. No pain in posterior thigh. Tenderness in glute min with deep palpation and manual today.      Special Tests:  SLS on L hip painful;  No anterior hip pain with full hip flexion,  Neg SLR,  Neg Fadir,  pain with faber position in L hip and glute.    LE ROM: Lumbar: ROM: flexion: mild limitation, Ext: min/mod limitation, SB: WFL Hip ROM: mild limitation for ER bil. with pain on L,   L knee: mild limitation for full flexion on L.      LE MMT:            Hip flex: 4/5,  Hip abd: 4 to 4-/5     TODAY'S TREATMENT:   03/16/2022  Ther ex: SKC 30 sec x 3 bil;   bent knee fall outs 3  minutes;  pelvic tilts x 20;  LTR x20 ; S/L QL stretch x 2 min bil; Cat/Cow x 20;  Standing: side glides to R and L  x 15 ea,  L SB x 15 with manual assist.  Manual:  long leg distraction for L hip and lumbar x 3 min;  STM to L QL and low lumbar        PATIENT EDUCATION:  Education details: HEP,  Person educated: Patient Education method: Explanation, Demonstration, Tactile cues, Verbal cues, and Handouts Education comprehension: verbalized understanding, returned demonstration, verbal cues required, tactile cues required, and needs further education     HOME EXERCISE PROGRAM: Access Code: [email or web address] Updated 02/16/22        ASSESSMENT:   CLINICAL IMPRESSION:   03/16/2022  Pt  with reproduction of L thigh pain with L SLS/loading, and L lumbar SB. Education and practice for QL stretching, and sided glides today, will add back mobility to HEP this week. Also added small heel lift to R shoe, for more equal hip/back alignment. Will assess effects at next visit. Pt has f/u with MD this week as well.   Eval: Pt presents with primary complaint of ongoing pain in L hip.  She has increased tenderness and tightness in glute, as well as gr troc and into anterior thigh. She seems to be having more pain in ant/lateral hip/thigh vs posteriorly from glutes. She also has weakness with L hip abd and increased pain and difficulty with standing/loading on L in L hip. She continues to have ongoing difficulty with  functional activities, and will benefit from skilled PT to improve deficits and pain.  Pt was seen previously this year for similar issues. Back, hamstring and foot pain seem to be better at this time. She will require skilled care to improve hip pain, KX modifier will be used .      OBJECTIVE IMPAIRMENTS Abnormal gait, decreased activity tolerance, decreased balance, decreased knowledge of use of DME, decreased mobility, difficulty walking, decreased ROM, decreased strength, hypomobility, increased  muscle spasms, impaired flexibility, improper body mechanics, and pain.    ACTIVITY LIMITATIONS cleaning, community activity, meal prep, occupation, yard work, and shopping.    PERSONAL FACTORS Time since onset of injury/illness/exacerbation are also affecting patient's functional outcome.      REHAB POTENTIAL: Good   CLINICAL DECISION MAKING: Stable/uncomplicated   EVALUATION COMPLEXITY: Low     GOALS: Goals reviewed with patient? Yes   SHORT TERM GOALS: Target date: 03/02/22   Pt to  be independent with initial/updated HEP   Goal status: INITIAL   2.  Pt to report decreased tenderness in L thigh to 5/10 in AM.    Goal status: INITIAL         LONG TERM GOALS: Target date: 04/13/22   Pt to be independent with final HEP   Goal status: INITIAL   2.  Pt to report decreased pain in L hip to 0-2/10 in AM and with activity    Goal status: INITIAL   3.  Pt to demo ability for SLS/loading on L LE, without pain greater than 2/10 to improve ability for functional activity and IADLS.    Goal status: INITIAL   4.  Pt to demo improved hip strength to be at least 4+/5 for all motions, to improve stability and pain.    Goal status: INITIAL       PLAN: PT FREQUENCY: 1-2x/week   PT DURATION: 8 weeks   PLANNED INTERVENTIONS: Therapeutic exercises, Therapeutic activity, Neuromuscular re-education, Balance training, Gait training, Patient/Family education, Joint manipulation, Joint mobilization, Stair training, DME instructions, Dry Needling, Electrical stimulation, Spinal manipulation, Spinal mobilization, Cryotherapy, Moist heat, Taping, Traction, Ultrasound, Ionotophoresis '4mg'$ /ml Dexamethasone, and Manual therapy   PLAN FOR NEXT SESSION: assess heel lift on R; continue back mobility   Lyndee Hensen, PT, DPT 10:27 AM  03/16/22

## 2022-03-18 NOTE — Progress Notes (Unsigned)
    [name] D.[place] Phone: [phone]   Assessment and Plan:     There are no diagnoses linked to this encounter.  ***   Pertinent previous records reviewed include ***   Follow Up: ***     Subjective:   I, Pamela Intrieri, am serving as a Education administrator for Doctor [name]   Chief Complaint: left hip pain    HPI:    10/21/2021 Patient is a 81 year old female complaining of left hip pain. 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    11/04/2021 Patient states that she is better , "the damn hamstrings still hurt " its more of an annoying pain but better than when she came and saw [place]   12/30/2021 Patient states that she is the same, still hurting     01/20/2022 Patient states that its better than it was but its still there   02/12/2022 Patient states that she is the same   03/19/2022 Patient states    Relevant Historical Information: None pertinent  Additional pertinent review of systems negative.   Current Outpatient Medications:    estrogen, conjugated,-medroxyprogesterone (PREMPRO) 0.45-1.5 MG tablet, Take 1 tablet by mouth daily., Disp: 28 tablet, Rfl: 0   hydrochlorothiazide (MICROZIDE) 12.5 MG capsule, TAKE 1 CAPSULE(12.5 MG) BY MOUTH DAILY, Disp: 90 capsule, Rfl: 1   losartan (COZAAR) 50 MG tablet, TAKE 1 TABLET(50 MG) BY MOUTH DAILY, Disp: 90 tablet, Rfl: 1   triamcinolone cream (KENALOG) 0.5 %, Mix 1:1 with lotion and apply twice daily for 1-2 weeks., Disp: 454 g, Rfl: 0   Objective:     There were no vitals filed for this  visit.    There is no height or weight on file to calculate BMI.    Physical Exam:    ***   Electronically signed by:  [name] D.[name] 7:45 AM 03/18/22

## 2022-03-19 ENCOUNTER — Ambulatory Visit (INDEPENDENT_AMBULATORY_CARE_PROVIDER_SITE_OTHER): Payer: Medicare Other | Admitting: Sports Medicine

## 2022-03-19 ENCOUNTER — Encounter: Payer: Medicare Other | Admitting: Physical Therapy

## 2022-03-19 VITALS — BP 124/80 | Ht 64.0 in | Wt 117.0 lb

## 2022-03-19 DIAGNOSIS — M1612 Unilateral primary osteoarthritis, left hip: Secondary | ICD-10-CM | POA: Diagnosis not present

## 2022-03-19 DIAGNOSIS — S76012D Strain of muscle, fascia and tendon of left hip, subsequent encounter: Secondary | ICD-10-CM

## 2022-03-19 DIAGNOSIS — M25552 Pain in left hip: Secondary | ICD-10-CM

## 2022-03-19 NOTE — Patient Instructions (Signed)
Good to see you   

## 2022-03-20 ENCOUNTER — Ambulatory Visit (INDEPENDENT_AMBULATORY_CARE_PROVIDER_SITE_OTHER): Payer: Medicare Other | Admitting: Physical Therapy

## 2022-03-20 ENCOUNTER — Encounter: Payer: Self-pay | Admitting: Physical Therapy

## 2022-03-20 DIAGNOSIS — M5416 Radiculopathy, lumbar region: Secondary | ICD-10-CM

## 2022-03-20 DIAGNOSIS — M25552 Pain in left hip: Secondary | ICD-10-CM | POA: Diagnosis not present

## 2022-03-20 NOTE — Therapy (Signed)
OUTPATIENT PHYSICAL THERAPY TREATMENT NOTE   Patient Name: Ellen [name] MRN: [id number] DOB:09-Apr-1941, 81 y.o., female Today's Date: 03/20/2022    END OF SESSION:   PT End of Session - 03/20/22 0929     Visit Number 8    Number of Visits 16    Date for PT Re-Evaluation 04/13/22    Authorization Type Medicare:  16 visits prior this year.    PT Start Time 0930    PT Stop Time 1010    PT Time Calculation (min) 40 min    Activity Tolerance Patient tolerated treatment well    Behavior During Therapy [facility] for tasks assessed/performed                 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   Left Buccal Cheek (Keratoacanthoma)   Past Surgical History:  Procedure Laterality Date   BOWEL RESECTION  07/09/2013   Procedure: SMALL BOWEL RESECTION;  Surgeon: Earnstine Regal, MD;  Location: [facility];  Service: General;;   LAPAROTOMY N/A 07/09/2013   Procedure: EXPLORATORY LAPAROTOMY;  Surgeon: Earnstine Regal, MD;  Location: [facility];  Service: General;  Laterality: N/A;   Patient Active Problem List   Diagnosis Date Noted   Osteoarthritis 12/01/2021   Plantar wart 12/01/2021   Ventral hernia 01/01/2021   Diverticulosis 01/05/2019   Dermatitis 73/53/2992   Systolic murmur 42/68/3419   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 Was  seen for 16 visits. Most pain in AM, used to be better during the day, but now some days it still hurts all day. Can have some days with less pain during the day. Is using percussion gun, likes it. Is still doing HEP, doesn't think it helps much. Likes heat.  No pain in foot/callous, has been using cream from podiatrist.   Currently: Most pain in ant/lateral hip/thigh area. States no back pain. Pain she has previously in hamstring is improved.    Previous history: No injury to report, but pt does lots of gardening. For herself, and grounds for a 7 acre church.    PERTINENT HISTORY: Hiatal hernia, R foot pain, HTN   PAIN:  Are you having pain? Yes: NPRS scale: 7/10 Pain location: L back into Leg,    Pain description: pain, radiaitng pain Aggravating factors: first thing in am (worse after increased activity the day prior) Relieving factors: activity/movement.    PRECAUTIONS: None   WEIGHT BEARING RESTRICTIONS No   FALLS:  Has patient fallen in last 6 months? No   PLOF: Independent   PATIENT GOALS   Decreased pain in L back, hip, leg, R foot.      OBJECTIVE:      COGNITION:           Overall cognitive status: Within functional  limits for tasks assessed      PALPATION: Pain and tenderness in L Gr troch, TFL and ant/ lateral thigh. No pain in posterior thigh. Tenderness in glute min with deep palpation and manual today.      Special Tests:  SLS on L hip painful;  No anterior hip pain with full hip flexion,  Neg SLR,  Neg Fadir,  pain with faber position in L hip and glute.    LE ROM: Lumbar: ROM: flexion: mild limitation, Ext: min/mod limitation, SB: WFL Hip ROM: mild limitation for ER bil. with pain on L,   L knee: mild limitation for full flexion on L.      LE MMT:            Hip flex: 4/5,  Hip abd: 4 to 4-/5     TODAY'S TREATMENT:   03/20/2022  Ther ex: SKC 30 sec x 3 bil; Piriformis 30 sec x 3 bil;   pelvic tilts x 20;  LTR x20 ;   S/L QL stretch x 2 min bil;    Bridging 2 x 10;  LAQ x 20 on L Standing: side glides to L  x 15 ea,  L SB x 15 with manual assist; Marching x 20;  Hip abd 3x10 bil; Step up 6 in x 10 bil;  Manual:  long leg distraction for L hip and lumbar x 3 min;    Other: Placed heel lift in R shoe, replaced temporary one given last week. Pt wearing with good comfort.       PATIENT EDUCATION:  Education details: HEP,  Person educated: Patient Education method: Explanation, Demonstration, Tactile cues, Verbal cues, and Handouts Education comprehension: verbalized understanding, returned demonstration, verbal cues required, tactile cues required, and needs further education     HOME EXERCISE PROGRAM: Access Code: [email or web address] Updated 02/16/22        ASSESSMENT:   CLINICAL IMPRESSION:   03/20/2022  Pt did a lot of yard work yesterday, and does not have significant pain today, like she sometimes does. She tried to be more even with weight bearing on both LEs vs more on L when bending. She has improved ability and much less pain with L side bending/loading today, as well as with L single leg stance/loading. Plan to continue back and hip mobility and strength as tolerated. Will continue to assess effects of heel lift.   Eval: Pt presents with primary complaint of ongoing pain in L hip.  She has increased tenderness and tightness in glute, as well as gr troc and into anterior thigh. She seems to be having more pain in ant/lateral hip/thigh vs posteriorly from glutes. She also has weakness with L hip abd and increased pain and difficulty with standing/loading on L in L hip. She continues to have ongoing difficulty with  functional activities, and will benefit from skilled PT to improve deficits and pain.  Pt was seen previously this year for similar issues. Back, hamstring and foot pain seem to be better at this time. She will require skilled care to improve hip pain, KX modifier will be used .      OBJECTIVE IMPAIRMENTS Abnormal gait, decreased  activity tolerance, decreased balance, decreased knowledge of use of DME, decreased mobility, difficulty walking, decreased ROM, decreased strength, hypomobility, increased muscle spasms, impaired flexibility, improper body mechanics, and pain.    ACTIVITY LIMITATIONS cleaning, community activity, meal prep, occupation, yard work, and shopping.    PERSONAL FACTORS Time since onset of injury/illness/exacerbation are also  affecting patient's functional outcome.      REHAB POTENTIAL: Good   CLINICAL DECISION MAKING: Stable/uncomplicated   EVALUATION COMPLEXITY: Low     GOALS: Goals reviewed with patient? Yes   SHORT TERM GOALS: Target date: 03/02/22   Pt to be independent with initial/updated HEP   Goal status: INITIAL   2.  Pt to report decreased tenderness in L thigh to 5/10 in AM.    Goal status: INITIAL         LONG TERM GOALS: Target date: 04/13/22   Pt to be independent with final HEP   Goal status: INITIAL   2.  Pt to report decreased pain in L hip to 0-2/10 in AM and with activity    Goal status: INITIAL   3.  Pt to demo ability for SLS/loading on L LE, without pain greater than 2/10 to improve ability for functional activity and IADLS.    Goal status: INITIAL   4.  Pt to demo improved hip strength to be at least 4+/5 for all motions, to improve stability and pain.    Goal status: INITIAL       PLAN: PT FREQUENCY: 1-2x/week   PT DURATION: 8 weeks   PLANNED INTERVENTIONS: Therapeutic exercises, Therapeutic activity, Neuromuscular re-education, Balance training, Gait training, Patient/Family education, Joint manipulation, Joint mobilization, Stair training, DME instructions, Dry Needling, Electrical stimulation, Spinal manipulation, Spinal mobilization, Cryotherapy, Moist heat, Taping, Traction, Ultrasound, Ionotophoresis '4mg'$ /ml Dexamethasone, and Manual therapy   PLAN FOR NEXT SESSION: assess heel lift on R; continue back mobility   Lyndee Hensen, PT,  DPT 9:29 AM  03/20/22

## 2022-03-23 ENCOUNTER — Ambulatory Visit (INDEPENDENT_AMBULATORY_CARE_PROVIDER_SITE_OTHER): Payer: Medicare Other | Admitting: Physical Therapy

## 2022-03-23 ENCOUNTER — Encounter: Payer: Self-pay | Admitting: Physical Therapy

## 2022-03-23 DIAGNOSIS — M25552 Pain in left hip: Secondary | ICD-10-CM

## 2022-03-23 DIAGNOSIS — M5416 Radiculopathy, lumbar region: Secondary | ICD-10-CM | POA: Diagnosis not present

## 2022-03-23 NOTE — Therapy (Signed)
OUTPATIENT PHYSICAL THERAPY TREATMENT NOTE   Patient Name: Ellen [name] MRN: [id number] DOB:August 17, 1940, 81 y.o., female Today's Date: 03/23/2022    END OF SESSION:   PT End of Session - 03/23/22 0956     Visit Number 9    Number of Visits 16    Date for PT Re-Evaluation 04/13/22    Authorization Type Medicare:  16 visits prior this year.    PT Start Time [id number]    PT Stop Time 1015    PT Time Calculation (min) 39 min    Activity Tolerance Patient tolerated treatment well    Behavior During Therapy [facility] for tasks assessed/performed                 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   Left Buccal Cheek (Keratoacanthoma)   Past Surgical History:  Procedure Laterality Date   BOWEL RESECTION  07/09/2013   Procedure: SMALL BOWEL RESECTION;  Surgeon: Earnstine Regal, MD;  Location: [facility];  Service: General;;   LAPAROTOMY N/A 07/09/2013   Procedure: EXPLORATORY LAPAROTOMY;  Surgeon: Earnstine Regal, MD;  Location: [facility];  Service: General;  Laterality: N/A;   Patient Active Problem List   Diagnosis Date Noted   Osteoarthritis 12/01/2021   Plantar wart 12/01/2021   Ventral hernia 01/01/2021   Diverticulosis 01/05/2019   Dermatitis 53/97/6734   Systolic murmur 19/37/9024   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   Hyperglycemia 10/05/2017   Leg edema 10/05/2017   Hammer toe 10/05/2017   Hot flashes due to menopause 10/05/2017   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     Eval: Pt last seen in  March-July for L LE pain. Was seen for 16 visits. Most pain in AM, used to be better during the day, but now some days it still hurts all day. Can have some days with less pain during the day. Is using percussion gun, likes it. Is still doing HEP, doesn't think it helps much. Likes heat.  No pain in foot/callous, has been using cream from podiatrist.   Currently: Most pain in ant/lateral hip/thigh area. States no back pain. Pain she has previously in hamstring is improved.    Previous history: No injury to report, but pt does lots of gardening. For herself, and grounds for a 7 acre church.    PERTINENT HISTORY: Hiatal hernia, R foot pain, HTN   PAIN:  Are you having pain? Yes: NPRS scale: 7/10 Pain location: L back into Leg,    Pain description: pain, radiaitng pain Aggravating factors: first thing in am (worse after increased activity the day prior) Relieving factors: activity/movement.    PRECAUTIONS: None   WEIGHT BEARING RESTRICTIONS No   FALLS:  Has patient fallen in last 6 months? No   PLOF: Independent   PATIENT GOALS   Decreased pain in L back, hip, leg, R foot.      OBJECTIVE:      COGNITION:  Overall cognitive status: Within functional limits for tasks assessed      PALPATION: Pain and tenderness in L Gr troch, TFL and ant/ lateral thigh. No pain in posterior thigh. Tenderness in glute min with deep palpation and manual today.      Special Tests:  SLS on L hip painful;  No anterior hip pain with full hip flexion,  Neg SLR,  Neg Fadir,  pain with faber position in L hip and glute.    LE ROM: Lumbar: ROM: flexion: mild limitation, Ext: min/mod limitation, SB: WFL Hip ROM: mild limitation for ER bil. with pain on L,   L knee: mild limitation for full flexion on L.      LE MMT:            Hip flex: 4/5,  Hip abd: 4 to 4-/5     TODAY'S TREATMENT:   03/23/2022  Ther ex: Piriformis 30 sec x 3 bil;   pelvic tilts x  20;  LTR x20 ;  Bridging 2 x 10;  LAQ x 20 on L Standing: side glides to L  x 15 ea,  L SB x 15 with manual assist; Marching x 20;  Hip abd 3x10 bil; Sit to stand higher mat table x 15; Rows GTB x 20;  Manual:  long leg distraction for L hip and lumbar x 3 min;       PATIENT EDUCATION:  Education details: Reviewed HEP,  Person educated: Patient Education method: Explanation, Demonstration, Tactile cues, Verbal cues, and Handouts Education comprehension: verbalized understanding, returned demonstration, verbal cues required, tactile cues required, and needs further education     HOME EXERCISE PROGRAM: Access Code: [email or web address]        ASSESSMENT:   CLINICAL IMPRESSION:   03/23/2022  Pt progressing well. Pain has been less dependent on activity done, she used to have quite a bit of pain with overdoing activity, but pain has been less varible. She is still having soreness mostly in AM s, but is doing well with HEP for mobility. Discussed continued mobility for back for decreasing compression and pain. She has improved ability for flexion as well as L SB after stretching and mobility for back today. Will work towards d/c and home management in next week or 2 if pt continues to do well.    Eval: Pt presents with primary complaint of ongoing pain in L hip.  She has increased tenderness and tightness in glute, as well as gr troc and into anterior thigh. She seems to be having more pain in ant/lateral hip/thigh vs posteriorly from glutes. She also has weakness with L hip abd and increased pain and difficulty with standing/loading on L in L hip. She continues to have ongoing difficulty with  functional activities, and will benefit from skilled PT to improve deficits and pain.  Pt was seen previously this year for similar issues. Back, hamstring and foot pain seem to be better at this time. She will require skilled care to improve hip pain, KX modifier will be used .      OBJECTIVE IMPAIRMENTS Abnormal  gait, decreased activity tolerance, decreased balance, decreased knowledge of use of DME, decreased mobility, difficulty walking, decreased ROM, decreased strength, hypomobility, increased muscle spasms, impaired flexibility, improper body mechanics, and pain.    ACTIVITY LIMITATIONS cleaning, community activity, meal prep, occupation, yard work, and shopping.    PERSONAL FACTORS Time since onset of injury/illness/exacerbation are also affecting patient's functional outcome.      REHAB POTENTIAL: Good  CLINICAL DECISION MAKING: Stable/uncomplicated   EVALUATION COMPLEXITY: Low     GOALS: Goals reviewed with patient? Yes   SHORT TERM GOALS: Target date: 03/02/22   Pt to be independent with initial/updated HEP   Goal status: MET   2.  Pt to report decreased tenderness in L thigh to 5/10 in AM.    Goal status: Partially met          LONG TERM GOALS: Target date: 04/13/22   Pt to be independent with final HEP   Goal status: INITIAL   2.  Pt to report decreased pain in L hip to 0-2/10 in AM and with activity    Goal status: INITIAL   3.  Pt to demo ability for SLS/loading on L LE, without pain greater than 2/10 to improve ability for functional activity and IADLS.    Goal status: INITIAL   4.  Pt to demo improved hip strength to be at least 4+/5 for all motions, to improve stability and pain.    Goal status: INITIAL       PLAN: PT FREQUENCY: 1-2x/week   PT DURATION: 8 weeks   PLANNED INTERVENTIONS: Therapeutic exercises, Therapeutic activity, Neuromuscular re-education, Balance training, Gait training, Patient/Family education, Joint manipulation, Joint mobilization, Stair training, DME instructions, Dry Needling, Electrical stimulation, Spinal manipulation, Spinal mobilization, Cryotherapy, Moist heat, Taping, Traction, Ultrasound, Ionotophoresis 7m/ml Dexamethasone, and Manual therapy   PLAN FOR NEXT SESSION: assess heel lift on R; continue back mobility    LLyndee Hensen PT, DPT 10:25 AM  03/23/22

## 2022-03-25 ENCOUNTER — Encounter: Payer: Medicare Other | Admitting: Physical Therapy

## 2022-03-30 ENCOUNTER — Ambulatory Visit (INDEPENDENT_AMBULATORY_CARE_PROVIDER_SITE_OTHER): Payer: Medicare Other | Admitting: Physical Therapy

## 2022-03-30 ENCOUNTER — Encounter: Payer: Self-pay | Admitting: Physical Therapy

## 2022-03-30 DIAGNOSIS — M5416 Radiculopathy, lumbar region: Secondary | ICD-10-CM | POA: Diagnosis not present

## 2022-03-30 DIAGNOSIS — M25552 Pain in left hip: Secondary | ICD-10-CM

## 2022-03-30 NOTE — Therapy (Signed)
OUTPATIENT PHYSICAL THERAPY TREATMENT NOTE   Patient Name: Ellen [name] MRN: [id number] DOB:1940/08/25, 81 y.o., female Today's Date: 03/30/2022    END OF SESSION:   PT End of Session - 03/30/22 1428     Visit Number 10    Number of Visits 16    Date for PT Re-Evaluation 04/13/22    Authorization Type Medicare:  16 visits prior this year.    PT Start Time 1347    PT Stop Time 1422    PT Time Calculation (min) 35 min    Activity Tolerance Patient tolerated treatment well    Behavior During Therapy [facility] for tasks assessed/performed                  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   Left Buccal Cheek (Keratoacanthoma)   Past Surgical History:  Procedure Laterality Date   BOWEL RESECTION  07/09/2013   Procedure: SMALL BOWEL RESECTION;  Surgeon: Earnstine Regal, MD;  Location: [facility];  Service: General;;   LAPAROTOMY N/A 07/09/2013   Procedure: EXPLORATORY LAPAROTOMY;  Surgeon: Earnstine Regal, MD;  Location: [facility];  Service: General;  Laterality: N/A;   Patient Active Problem List   Diagnosis Date Noted   Osteoarthritis 12/01/2021   Plantar wart 12/01/2021   Ventral hernia 01/01/2021   Diverticulosis 01/05/2019   Dermatitis 08/65/7846   Systolic murmur 96/29/5284   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      Eval: Pt last seen in  March-July for L LE pain. Was seen for 16 visits. Most pain in AM, used to be better during the day, but now some days it still hurts all day. Can have some days with less pain during the day. Is using percussion gun, likes it. Is still doing HEP, doesn't think it helps much. Likes heat.  No pain in foot/callous, has been using cream from podiatrist.   Currently: Most pain in ant/lateral hip/thigh area. States no back pain. Pain she has previously in hamstring is improved.    Previous history: No injury to report, but pt does lots of gardening. For herself, and grounds for a 7 acre church.    PERTINENT HISTORY: Hiatal hernia, R foot pain, HTN   PAIN:  Are you having pain? Yes: NPRS scale: 7/10 Pain location: L back into Leg,    Pain description: pain, radiaitng pain Aggravating factors: first thing in am (worse after increased activity the day prior) Relieving factors: activity/movement.    PRECAUTIONS: None   WEIGHT BEARING RESTRICTIONS No   FALLS:  Has patient fallen in last 6 months? No   PLOF: Independent   PATIENT GOALS   Decreased pain in L back, hip, leg, R foot.      OBJECTIVE:      COGNITION:  Overall cognitive status: Within functional limits for tasks assessed      PALPATION: Pain and tenderness in L Gr troch, TFL and ant/ lateral thigh. No pain in posterior thigh. Tenderness in glute min with deep palpation and manual today.      Special Tests:  SLS on L hip painful;  No anterior hip pain with full hip flexion,  Neg SLR,  Neg Fadir,  pain with faber position in L hip and glute.    LE ROM: Lumbar: ROM: flexion: mild limitation, Ext: min/mod limitation, SB: WFL Hip ROM: mild limitation for ER bil. with pain on L,   L knee: mild limitation for full flexion on L.      LE MMT:            Hip flex: 4/5,  Hip abd: 4 to 4-/5     TODAY'S TREATMENT:   03/30/2022  Ther ex:  Supine:  Piriformis 30 sec x 3 bil;   pelvic tilts  x 20;  LTR x20 ;  Bridging 2 x 10;    Standing: L and  R SB x 10 with manual assist; Marching x 20;  Hip abd 3x10 bil; Sit to stand higher mat table x 15; Rows GTB x 20;    Manual:  long leg distraction for L hip and lumbar x 3 min;       PATIENT EDUCATION:  Education details: Reviewed HEP,  Person educated: Patient Education method: Explanation, Demonstration, Tactile cues, Verbal cues, and Handouts Education comprehension: verbalized understanding, returned demonstration, verbal cues required, tactile cues required, and needs further education     HOME EXERCISE PROGRAM: Access Code: [email or web address]        ASSESSMENT:   CLINICAL IMPRESSION:   03/30/2022  Pt progressing well. She has improved ability for SLS without increased pain with loading, as well as improved tolerance for activity. She overall is having less pain, and has improved awareness of her posture and mechanics when working outside. She is progressing well, plan to see pt for 1-2 more visit, will finalize HEP.   Eval: Pt presents with primary complaint of ongoing pain in L hip.  She has increased tenderness and tightness in glute, as well as gr troc and into anterior thigh. She seems to be having more pain in ant/lateral hip/thigh vs posteriorly from glutes. She also has weakness with L hip abd and increased pain and difficulty with standing/loading on L in L hip. She continues to have ongoing difficulty with  functional activities, and will benefit from skilled PT to improve deficits and pain.  Pt was seen previously this year for similar issues. Back, hamstring and foot pain seem to be better at this time. She will require skilled care to improve hip pain, KX modifier will be used .      OBJECTIVE IMPAIRMENTS Abnormal gait, decreased activity tolerance, decreased balance, decreased knowledge of use of DME, decreased mobility, difficulty walking, decreased ROM, decreased strength, hypomobility, increased muscle spasms, impaired  flexibility, improper body mechanics, and pain.    ACTIVITY LIMITATIONS cleaning, community activity, meal prep, occupation, yard work, and shopping.    PERSONAL FACTORS Time since onset of injury/illness/exacerbation are also affecting patient's functional outcome.      REHAB POTENTIAL: Good   CLINICAL DECISION MAKING: Stable/uncomplicated   EVALUATION COMPLEXITY: Low     GOALS: Goals reviewed with patient? Yes   SHORT TERM GOALS: Target date: 03/02/22   Pt to be independent with initial/updated HEP   Goal status: MET  2.  Pt to report decreased tenderness in L thigh to 5/10 in AM.    Goal status: Partially met          LONG TERM GOALS: Target date: 04/13/22   Pt to be independent with final HEP   Goal status: INITIAL   2.  Pt to report decreased pain in L hip to 0-2/10 in AM and with activity    Goal status: INITIAL   3.  Pt to demo ability for SLS/loading on L LE, without pain greater than 2/10 to improve ability for functional activity and IADLS.    Goal status: INITIAL   4.  Pt to demo improved hip strength to be at least 4+/5 for all motions, to improve stability and pain.    Goal status: INITIAL       PLAN: PT FREQUENCY: 1-2x/week   PT DURATION: 8 weeks   PLANNED INTERVENTIONS: Therapeutic exercises, Therapeutic activity, Neuromuscular re-education, Balance training, Gait training, Patient/Family education, Joint manipulation, Joint mobilization, Stair training, DME instructions, Dry Needling, Electrical stimulation, Spinal manipulation, Spinal mobilization, Cryotherapy, Moist heat, Taping, Traction, Ultrasound, Ionotophoresis 22m/ml Dexamethasone, and Manual therapy   PLAN FOR NEXT SESSION: assess heel lift on R; continue back mobility   LLyndee Hensen PT, DPT 2:29 PM  03/30/22

## 2022-04-01 ENCOUNTER — Ambulatory Visit (INDEPENDENT_AMBULATORY_CARE_PROVIDER_SITE_OTHER): Payer: Medicare Other

## 2022-04-01 ENCOUNTER — Encounter: Payer: Self-pay | Admitting: Physical Therapy

## 2022-04-01 ENCOUNTER — Ambulatory Visit (INDEPENDENT_AMBULATORY_CARE_PROVIDER_SITE_OTHER): Payer: Medicare Other | Admitting: Physical Therapy

## 2022-04-01 DIAGNOSIS — Z23 Encounter for immunization: Secondary | ICD-10-CM | POA: Diagnosis not present

## 2022-04-01 DIAGNOSIS — M25552 Pain in left hip: Secondary | ICD-10-CM | POA: Diagnosis not present

## 2022-04-01 DIAGNOSIS — M5416 Radiculopathy, lumbar region: Secondary | ICD-10-CM

## 2022-04-01 NOTE — Therapy (Signed)
OUTPATIENT PHYSICAL THERAPY TREATMENT NOTE   Patient Name: Ellen [name] MRN: [id number] DOB:[birth year], 81 y.o., female Today's Date: 04/01/2022    END OF SESSION:   PT End of Session - 04/01/22 1205     Visit Number 11    Number of Visits 16    Date for PT Re-Evaluation 04/13/22    Authorization Type Medicare:  16 visits prior this year.    PT Start Time 0935    PT Stop Time 1016    PT Time Calculation (min) 41 min    Activity Tolerance Patient tolerated treatment well    Behavior During Therapy [facility] for tasks assessed/performed                   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   Left Buccal Cheek (Keratoacanthoma)   Past Surgical History:  Procedure Laterality Date   BOWEL RESECTION  07/09/2013   Procedure: SMALL BOWEL RESECTION;  Surgeon: Earnstine Regal, MD;  Location: [facility];  Service: General;;   LAPAROTOMY N/A 07/09/2013   Procedure: EXPLORATORY LAPAROTOMY;  Surgeon: Earnstine Regal, MD;  Location: [facility];  Service: General;  Laterality: N/A;   Patient Active Problem List   Diagnosis Date Noted   Osteoarthritis 12/01/2021   Plantar wart 12/01/2021   Ventral hernia 01/01/2021   Diverticulosis 01/05/2019   Dermatitis 95/62/1308   Systolic murmur 65/78/4696   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      Eval: Pt last seen in  March-July for L LE pain. Was seen for 16 visits. Most pain in AM, used to be better during the day, but now some days it still hurts all day. Can have some days with less pain during the day. Is using percussion gun, likes it. Is still doing HEP, doesn't think it helps much. Likes heat.  No pain in foot/callous, has been using cream from podiatrist.   Currently: Most pain in ant/lateral hip/thigh area. States no back pain. Pain she has previously in hamstring is improved.    Previous history: No injury to report, but pt does lots of gardening. For herself, and grounds for a 7 acre church.    PERTINENT HISTORY: Hiatal hernia, R foot pain, HTN   PAIN:  Are you having pain? Yes: NPRS scale: 7/10 Pain location: L back into Leg,    Pain description: pain, radiaitng pain Aggravating factors: first thing in am (worse after increased activity the day prior) Relieving factors: activity/movement.    PRECAUTIONS: None   WEIGHT BEARING RESTRICTIONS No   FALLS:  Has patient fallen in last 6 months? No   PLOF: Independent   PATIENT GOALS   Decreased pain in L back, hip, leg, R foot.      OBJECTIVE:      COGNITION:  Overall cognitive status: Within functional limits for tasks assessed         LE ROM: Lumbar: ROM: flexion: WNL ,  Ext: min limitation, SB: WFL, L SB : mild pain in thigh Hip ROM: WFL L knee: mild limitation for full flexion on L.      LE MMT:            Hip flex: 4+/5,  Hip abd: 4+/5     TODAY'S TREATMENT:   04/01/2022  Ther ex:  Supine:  Piriformis 30 sec x 3 bil;  Hip Er fallouts x 20;  pelvic tilts x 20;  LTR x20 ; SKTC x 3 bil;  Bridging 2 x 10;  s/l hip clams x 15 bil;   Standing: L and  R SB x 10 with manual assist; Marching x 20;  Hip abd 3x10 bil; Sit to stand higher mat table x 15;    Manual:  long leg distraction for L hip and lumbar x 3 min;       PATIENT EDUCATION:  Education details: Reviewed HEP,  Person  educated: Patient Education method: Explanation, Demonstration, Tactile cues, Verbal cues, and Handouts Education comprehension: verbalized understanding, returned demonstration, verbal cues required, tactile cues required, and needs further education     HOME EXERCISE PROGRAM: Access Code: [email or web address]        ASSESSMENT:   CLINICAL IMPRESSION:   04/01/2022  Pt has made good progress. She overall is having less pain. She still has mild soreness into L thigh at times, with increased standing activity. Also elicited with L lumbar side bending at times. She is doing well with HEP for mobility of hip and back, and also for strength of hips and core. Pt has met goals and is ready for d/c to HEP at this time. Pt in agreement with plan.   Eval: Pt presents with primary complaint of ongoing pain in L hip.  She has increased tenderness and tightness in glute, as well as gr troc and into anterior thigh. She seems to be having more pain in ant/lateral hip/thigh vs posteriorly from glutes. She also has weakness with L hip abd and increased pain and difficulty with standing/loading on L in L hip. She continues to have ongoing difficulty with  functional activities, and will benefit from skilled PT to improve deficits and pain.  Pt was seen previously this year for similar issues. Back, hamstring and foot pain seem to be better at this time. She will require skilled care to improve hip pain, KX modifier will be used .      OBJECTIVE IMPAIRMENTS Abnormal gait, decreased activity tolerance, decreased balance, decreased knowledge of use of DME, decreased mobility, difficulty walking, decreased ROM, decreased strength, hypomobility, increased muscle spasms, impaired flexibility, improper body mechanics, and pain.    ACTIVITY LIMITATIONS cleaning, community activity, meal prep, occupation, yard work, and shopping.    PERSONAL FACTORS Time since onset of injury/illness/exacerbation are also affecting patient's  functional outcome.      REHAB POTENTIAL: Good   CLINICAL DECISION MAKING: Stable/uncomplicated   EVALUATION COMPLEXITY: Low     GOALS: Goals reviewed with patient? Yes   SHORT TERM GOALS: Target date: 03/02/22   Pt to be independent with initial/updated HEP   Goal status: MET   2.  Pt to report decreased tenderness in L thigh to 5/10 in AM.    Goal status: MET         LONG TERM GOALS: Target date: 04/13/22   Pt to  be independent with final HEP   Goal status: MET   2.  Pt to report decreased pain in L hip to 0-2/10 in AM and with activity    Goal status: MET   3.  Pt to demo ability for SLS/loading on L LE, without pain greater than 2/10 to improve ability for functional activity and IADLS.    Goal status: MET   4.  Pt to demo improved hip strength to be at least 4+/5 for all motions, to improve stability and pain.    Goal status: MET       PLAN: PT FREQUENCY: 1-2x/week   PT DURATION: 8 weeks   PLANNED INTERVENTIONS: Therapeutic exercises, Therapeutic activity, Neuromuscular re-education, Balance training, Gait training, Patient/Family education, Joint manipulation, Joint mobilization, Stair training, DME instructions, Dry Needling, Electrical stimulation, Spinal manipulation, Spinal mobilization, Cryotherapy, Moist heat, Taping, Traction, Ultrasound, Ionotophoresis 90m/ml Dexamethasone, and Manual therapy   PLAN FOR NEXT SESSION:  LLyndee Hensen PT, DPT 12:06 PM  04/01/22  PHYSICAL THERAPY DISCHARGE SUMMARY  Visits from Start of Care: 11 Plan: Patient agrees to discharge.  Patient goals were met. Patient is being discharged due to meeting the stated rehab goals.     LLyndee Hensen PT, DPT 12:12 PM  04/01/22

## 2022-04-06 ENCOUNTER — Encounter: Payer: Medicare Other | Admitting: Physical Therapy

## 2022-04-13 ENCOUNTER — Encounter: Payer: Medicare Other | Admitting: Physical Therapy

## 2022-04-15 NOTE — Progress Notes (Unsigned)
    Ellen [name] D.[place] Phone: [phone]   Assessment and Plan:     There are no diagnoses linked to this encounter.  ***   Pertinent previous records reviewed include ***   Follow Up: ***     Subjective:   I, Ellen [name], am serving as a Education administrator for Doctor [name]   Chief Complaint: left hip pain    HPI:    10/21/2021 Patient is a 81 year old female complaining of left hip pain. 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   Current Outpatient Medications:    estrogen, conjugated,-medroxyprogesterone (PREMPRO) 0.45-1.5 MG tablet, Take 1 tablet by mouth daily., Disp: 28 tablet, Rfl: 0   hydrochlorothiazide (MICROZIDE) 12.5 MG capsule, TAKE 1 CAPSULE(12.5 MG) BY MOUTH DAILY, Disp: 90 capsule, Rfl: 1   losartan (COZAAR) 50 MG tablet, TAKE 1 TABLET(50 MG) BY MOUTH DAILY, Disp: 90 tablet, Rfl: 1   triamcinolone cream (KENALOG) 0.5 %, Mix 1:1 with lotion and apply  twice daily for 1-2 weeks., Disp: 454 g, Rfl: 0   Objective:     There were no vitals filed for this visit.    There is no height or weight on file to calculate BMI.    Physical Exam:    ***   Electronically signed by:  Ellen [name] D.[name] 8:26 AM 04/15/22

## 2022-04-16 ENCOUNTER — Ambulatory Visit (INDEPENDENT_AMBULATORY_CARE_PROVIDER_SITE_OTHER): Payer: Medicare Other | Admitting: Sports Medicine

## 2022-04-16 VITALS — BP 124/84 | HR 98 | Ht 64.0 in | Wt 118.0 lb

## 2022-04-16 DIAGNOSIS — S76012D Strain of muscle, fascia and tendon of left hip, subsequent encounter: Secondary | ICD-10-CM

## 2022-04-16 DIAGNOSIS — M25552 Pain in left hip: Secondary | ICD-10-CM | POA: Diagnosis not present

## 2022-04-16 DIAGNOSIS — M1612 Unilateral primary osteoarthritis, left hip: Secondary | ICD-10-CM | POA: Diagnosis not present

## 2022-04-16 NOTE — Patient Instructions (Addendum)
Good to see you 6-8 week follow up

## 2022-05-13 ENCOUNTER — Other Ambulatory Visit: Payer: Self-pay | Admitting: Family Medicine

## 2022-05-28 ENCOUNTER — Ambulatory Visit: Payer: Medicare Other | Admitting: Sports Medicine

## 2022-06-03 ENCOUNTER — Ambulatory Visit: Payer: Medicare Other | Admitting: Physician Assistant

## 2022-06-04 ENCOUNTER — Ambulatory Visit (INDEPENDENT_AMBULATORY_CARE_PROVIDER_SITE_OTHER): Payer: Medicare Other | Admitting: Sports Medicine

## 2022-06-04 VITALS — HR 76 | Ht 64.0 in | Wt 117.0 lb

## 2022-06-04 DIAGNOSIS — M1612 Unilateral primary osteoarthritis, left hip: Secondary | ICD-10-CM

## 2022-06-04 DIAGNOSIS — M7062 Trochanteric bursitis, left hip: Secondary | ICD-10-CM | POA: Diagnosis not present

## 2022-06-04 DIAGNOSIS — M7632 Iliotibial band syndrome, left leg: Secondary | ICD-10-CM | POA: Diagnosis not present

## 2022-06-04 DIAGNOSIS — M25552 Pain in left hip: Secondary | ICD-10-CM

## 2022-06-04 NOTE — Progress Notes (Signed)
Ellen [name] D.[place] Phone: [phone]   Assessment and Plan:     1. Left hip pain 2. Primary osteoarthritis of left hip 3. It band syndrome, left 4. Greater trochanteric bursitis of left hip  -Chronic with exacerbation, subsequent sports medicine visit - Overall improvement, "70%", in left hip pain with multiple therapies including HEP, physical therapy, Tylenol, course of NSAIDs, intra-articular hip CSI performed on 12/30/2021.  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  Could be considered at future office visit  Pertinent previous records reviewed include none   Follow Up: 6 to 8 weeks for reevaluation.  Could consider greater trochanteric CSI versus alternative CSI based on patient's symptoms   Subjective:   I, Ellen [name], am serving as a Education administrator for Doctor [name]   Chief Complaint: left hip pain    HPI:    10/21/2021 Patient is a 82 year old female complaining of left hip pain. 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     11/04/2021 Patient states that she is better , "the damn hamstrings still hurt " its more of an annoying pain but better than when she came and saw [place]   12/30/2021 Patient states that she is the same, still hurting     01/20/2022 Patient states that its better than it was but its still there   02/12/2022 Patient states that she is the same    03/19/2022 Patient states that PT is going into a new phase , still has a little pain not as bad    04/16/2022 Patient states she is better not 100% and she doesn't think it will get there    06/04/2022 Patient states she is about 70% better     Relevant Historical Information: None pertinent  Additional pertinent review of systems negative.   Current Outpatient Medications:    hydrochlorothiazide (MICROZIDE) 12.5 MG capsule, TAKE 1 CAPSULE(12.5 MG) BY MOUTH DAILY, Disp: 90 capsule, Rfl: 1   losartan (COZAAR) 50 MG tablet, TAKE 1 TABLET(50 MG) BY MOUTH DAILY, Disp: 90 tablet, Rfl: 1   PREMPRO 0.45-1.5 MG tablet, TAKE 1 TABLET BY MOUTH DAILY, Disp: 28 tablet, Rfl: 0   triamcinolone cream (KENALOG) 0.5 %, Mix 1:1 with lotion and apply twice daily for 1-2 weeks., Disp: 454 g, Rfl: 0   Objective:     Vitals:   06/04/22 0923  Pulse: 76  SpO2: 97%  Weight: 117 lb (53.1 kg)  Height: '5\' 4"'$  (1.626 m)      Body mass index is 20.08 kg/m.    Physical Exam:  General: awake, alert, and oriented no acute distress, nontoxic Skin: no suspicious lesions or rashes Neuro:sensation intact distally with no dificits, normal muscle tone, no atrophy, strength 5/5 in all tested lower ext groups Psych: normal mood and affect, speech clear   Left hip: No deformity, swelling or wasting ROM Flexion 90, ext 20, IR 30, ER 35 TTP mild greater trochanter, moderate IT band, mild gluteal musculature, moderate hip flexors NTTP   si joint, lumbar spine Negative log roll with FROM Positive FABER Positive FADIR negative piriformis test   Gait normal      Electronically signed by:  Ellen [name] D.[name] 9:40 AM 06/04/22

## 2022-06-04 NOTE — Patient Instructions (Signed)
Good to see you Continue Tylenol [phone] mg 2-3 times a day for pain relief  Start using Voltaren gel topically over areas of pain  Hip HEP 6-8 week follow up

## 2022-06-27 ENCOUNTER — Other Ambulatory Visit: Payer: Self-pay | Admitting: Family Medicine

## 2022-07-16 ENCOUNTER — Ambulatory Visit (INDEPENDENT_AMBULATORY_CARE_PROVIDER_SITE_OTHER): Payer: Medicare Other

## 2022-07-16 VITALS — Wt 117.0 lb

## 2022-07-16 DIAGNOSIS — Z0000 Encounter for general adult medical examination without abnormal findings: Secondary | ICD-10-CM | POA: Diagnosis not present

## 2022-07-16 NOTE — Progress Notes (Signed)
I connected with  Ellen [name] on 07/16/22 by a audio enabled telemedicine application and verified that I am speaking with the correct person using two identifiers.  Patient Location: Home  Provider Location: Office/Clinic  I discussed the limitations of evaluation and management by telemedicine. The patient expressed understanding and agreed to proceed.   Subjective:   Ellen [name] is a 82 y.o. female who presents for Medicare Annual (Subsequent) preventive examination.  Review of Systems     Cardiac Risk Factors include: advanced age (>74mn, >>85women);hypertension     Objective:    Today's Vitals   07/16/22 1346  Weight: 117 lb (53.1 kg)   Body mass index is 20.08 kg/m.     07/16/2022    1:51 PM 02/16/2022    8:46 AM 09/22/2021    3:50 PM 07/03/2021    2:44 PM 06/27/2020    3:27 PM 05/15/2019   10:14 AM 02/22/2019    9:43 AM  Advanced Directives  Does Patient Have a Medical Advance Directive? Yes No No Yes Yes Yes Yes  Type of AParamedicof [place] will   Healthcare Power of [place] will  Does patient want to make changes to medical advance directive? No - Patient declined     No - Patient declined   Copy of [place] Chart? Yes - validated most recent copy scanned in chart (See row information)   No - copy requested No - copy requested No - copy requested Yes - validated most recent copy scanned in chart (See row information)  Would patient like information on creating a medical advance directive?  No - Patient declined No - Patient declined        Current Medications (verified) Outpatient Encounter Medications as of 07/16/2022  Medication Sig   hydrochlorothiazide (MICROZIDE) 12.5 MG capsule TAKE 1 CAPSULE(12.5 MG) BY MOUTH DAILY   losartan (COZAAR) 50 MG tablet TAKE 1 TABLET(50 MG) BY MOUTH DAILY    PREMPRO 0.45-1.5 MG tablet TAKE 1 TABLET BY MOUTH DAILY   triamcinolone cream (KENALOG) 0.5 % Mix 1:1 with lotion and apply twice daily for 1-2 weeks.   No facility-administered encounter medications on file as of 07/16/2022.    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   Left Buccal Cheek (Keratoacanthoma)   Past Surgical History:  Procedure Laterality Date   BOWEL RESECTION  07/09/2013   Procedure: SMALL BOWEL RESECTION;  Surgeon: TEarnstine Regal MD;  Location: [facility];  Service: General;;   LAPAROTOMY N/A 07/09/2013   Procedure: EXPLORATORY LAPAROTOMY;  Surgeon: TEarnstine Regal MD;  Location: [facility];  Service: General;  Laterality: N/A;   Family History  Problem Relation Age of Onset   Diabetes Mother    Heart attack Mother    CAD Father    Heart attack Father    Breast cancer Neg Hx    Social History   Socioeconomic History   Marital status: Married    Spouse name: Not on file   Number of children: Not on file   Years of education: Not on file   Highest education level: Not on file  Occupational History   Occupation: Retired   Tobacco Use   Smoking status: Former  Types: Cigarettes   Smokeless tobacco: Never  Vaping Use   Vaping Use: Never used  Substance and Sexual Activity   Alcohol use: Yes    Alcohol/week: 7.0 standard drinks of alcohol    Types: 7 Glasses of wine per week   Drug use: No   Sexual activity: Not Currently  Other Topics Concern   Not on file  Social History Narrative   Enjoys gardening    Social Determinants of Health   Financial Resource Strain: Low Risk  (07/16/2022)   Overall Financial Resource Strain (CARDIA)    Difficulty of Paying Living Expenses: Not hard at all  Food Insecurity: No Food Insecurity (07/16/2022)   Hunger Vital Sign    Worried About Running Out of Food in  the Last Year: Never true    Ran Out of Food in the Last Year: Never true  Transportation Needs: No Transportation Needs (07/16/2022)   PRAPARE - Hydrologist (Medical): No    Lack of Transportation (Non-Medical): No  Physical Activity: Sufficiently Active (07/16/2022)   Exercise Vital Sign    Days of Exercise per Week: 5 days    Minutes of Exercise per Session: 120 min  Stress: No Stress Concern Present (07/16/2022)   [place]    Feeling of Stress : Not at all  Social Connections: Moderately Integrated (07/16/2022)   Social Connection and Isolation Panel [NHANES]    Frequency of Communication with Friends and Family: More than three times a week    Frequency of Social Gatherings with Friends and Family: More than three times a week    Attends Religious Services: More than 4 times per year    Active Member of [employer] or Organizations: No    Attends Music therapist: Never    Marital Status: Married    Tobacco Counseling Counseling given: Not Answered   Clinical Intake:  Pre-visit preparation completed: Yes  Pain : No/denies pain     BMI - recorded: 20.08 Nutritional Status: BMI of 19-24  Normal Nutritional Risks: None Diabetes: No  How often do you need to have someone help you when you read instructions, pamphlets, or other written materials from your doctor or pharmacy?: 1 - Never  Diabetic?no  Interpreter Needed?: No  Information entered by :: Charlott Rakes, LPN   Activities of Daily Living    07/16/2022    1:52 PM  In your present state of health, do you have any difficulty performing the following activities:  Hearing? 0  Vision? 0  Difficulty concentrating or making decisions? 0  Walking or climbing stairs? 0  Dressing or bathing? 0  Doing errands, shopping? 0  Preparing Food and eating ? N  Using the Toilet? N  In the past six months, have you  accidently leaked urine? N  Do you have problems with loss of bowel control? N  Managing your Medications? N  Managing your Finances? N  Housekeeping or managing your Housekeeping? N    Patient Care Team: Vivi Barrack, MD as PCP - General (Family Medicine) Willette Pa Ronalee Red, PA-C as Physician Assistant (Dermatology) Edythe Clarity, [facility] as Pharmacist (Pharmacist)  Indicate any recent Medical Services you may have received from other than [facility] providers in the past year (date may be approximate).     Assessment:   This is a routine wellness examination for Ellen [name].  Hearing/Vision screen Hearing Screening - Comments:: Pt denies any hearing issues  Vision Screening - Comments:: Pt follows up with [facility] for annual eye exams   Dietary issues and exercise activities discussed:     Goals Addressed             This Visit's Progress    Patient Stated       Get rid of left leg pain I've had for a year       Depression Screen    07/16/2022    1:50 PM 12/01/2021    9:52 AM 09/11/2021    8:58 AM 07/03/2021    2:42 PM 03/07/2021    1:38 PM 01/01/2021   11:11 AM 06/27/2020    3:25 PM  PHQ 2/9 Scores  PHQ - 2 Score 0 0 0 0 0 0 0    Fall Risk    07/16/2022    1:52 PM 12/01/2021    9:52 AM 09/11/2021    8:58 AM 07/03/2021    2:45 PM 03/07/2021    1:38 PM  Fall Risk   Falls in the past year? 0 0 0 0 0  Number falls in past yr: 0 0 0 0 0  Injury with Fall? 0 0 0 0 0  Risk for fall due to : Impaired vision;Impaired balance/gait No Fall Risks No Fall Risks Impaired vision;Impaired balance/gait No Fall Risks  Risk for fall due to: Comment with left leg pain      Follow up Falls prevention discussed   Falls prevention discussed     FALL RISK PREVENTION PERTAINING TO THE HOME:  Any stairs in or around the home? Yes  If so, are there any without handrails? No  Home free of loose throw rugs in walkways, pet beds, electrical cords, etc? Yes  Adequate lighting in  your home to reduce risk of falls? Yes   ASSISTIVE DEVICES UTILIZED TO PREVENT FALLS:  Life alert? No  Use of a cane, walker or w/c? No  Grab bars in the bathroom? Yes  Shower chair or bench in shower? No  Elevated toilet seat or a handicapped toilet? No   TIMED UP AND GO:  Was the test performed? No .   Cognitive Function:        07/16/2022    1:53 PM 07/03/2021    2:46 PM 06/27/2020    3:31 PM  6CIT Screen  What Year? 0 points 0 points 0 points  What month? 0 points 0 points 0 points  What time? 0 points 0 points   Count back from 20 0 points 0 points 0 points  Months in reverse 0 points 0 points 0 points  Repeat phrase 0 points 2 points 0 points  Total Score 0 points 2 points     Immunizations Immunization History  Administered Date(s) Administered   Fluad Quad(high Dose 65+) 02/17/2019, 06/27/2020, 02/19/2021, 04/01/2022   Influenza, High Dose Seasonal PF 07/25/2018   PFIZER(Purple Top)SARS-COV-2 Vaccination 07/28/2019, 08/18/2019, 04/22/2020   Pfizer Covid-19 Vaccine Bivalent Booster 32yr & up 05/23/2021   Tdap 11/04/2012    TDAP status: Up to date  Flu Vaccine status: Up to date  Pneumococcal vaccine status: Due, Education has been provided regarding the importance of this vaccine. Advised may receive this vaccine at local pharmacy or Health Dept. Aware to provide a copy of the vaccination record if obtained from local pharmacy or Health Dept. Verbalized acceptance and understanding.  Covid-19 vaccine status: Completed vaccines  Qualifies for Shingles Vaccine? Yes   Zostavax completed No   Shingrix Completed?:  No.    Education has been provided regarding the importance of this vaccine. Patient has been advised to call insurance company to determine out of pocket expense if they have not yet received this vaccine. Advised may also receive vaccine at local pharmacy or Health Dept. Verbalized acceptance and understanding.  Screening Tests Health Maintenance   Topic Date Due   Zoster Vaccines- Shingrix (1 of 2) Never done   COVID-19 Vaccine (5 - 2023-24 season) 01/16/2022   Pneumonia Vaccine 77+ Years old (1 of 1 - PCV) 12/02/2022 (Originally 03/11/2004)   MAMMOGRAM  10/16/2022   DTaP/Tdap/Td (2 - Td or Tdap) 11/05/2022   Medicare Annual Wellness (AWV)  07/16/2023   INFLUENZA VACCINE  Completed   DEXA SCAN  Completed   HPV VACCINES  Aged Out    Health Maintenance  Health Maintenance Due  Topic Date Due   Zoster Vaccines- Shingrix (1 of 2) Never done   COVID-19 Vaccine (5 - 2023-24 season) 01/16/2022    Colorectal cancer screening: No longer required.   Mammogram status: Completed 10/15/21. Repeat every year  Bone Density status: Completed 08/08/19. Results reflect: Bone density results: NORMAL. Repeat every 2 years.   Additional Screening:   Vision Screening: Recommended annual ophthalmology exams for early detection of glaucoma and other disorders of the eye. Is the patient up to date with their annual eye exam?  Yes  Who is the provider or what is the name of the office in which the patient attends annual eye exams? [facility]  If pt is not established with a provider, would they like to be referred to a provider to establish care? No .   Dental Screening: Recommended annual dental exams for proper oral hygiene  Community Resource Referral / Chronic Care Management: CRR required this visit?  No   CCM required this visit?  No      Plan:     I have personally reviewed and noted the following in the patient's chart:   Medical and social history Use of alcohol, tobacco or illicit drugs  Current medications and supplements including opioid prescriptions. Patient is not currently taking opioid prescriptions. Functional ability and status Nutritional status Physical activity Advanced directives List of other physicians Hospitalizations, surgeries, and ER visits in previous 12 months Vitals Screenings to include  cognitive, depression, and falls Referrals and appointments  In addition, I have reviewed and discussed with patient certain preventive protocols, quality metrics, and best practice recommendations. A written personalized care plan for preventive services as well as general preventive health recommendations were provided to patient.     Willette Brace, LPN   [id number]   Nurse Notes: none

## 2022-07-16 NOTE — Patient Instructions (Signed)
Ellen [name] , Thank you for taking time to come for your Medicare Wellness Visit. I appreciate your ongoing commitment to your health goals. Please review the following plan we discussed and let me know if I can assist you in the future.   These are the goals we discussed:  Goals      Patient Stated     Staying active     Patient Stated     Stay healthy & active      Patient Stated     Get rid of left leg pain I've had for a year     Track and Manage My Blood Pressure-Hypertension     Timeframe:  Long-Range Goal Priority:  High Start Date:   12/23/20                          Expected End Date:  06/25/21                     Follow Up Date 04/16/21    - check blood pressure 3 times per week - choose a place to take my blood pressure (home, clinic or office, retail store) - write blood pressure results in a log or diary    Why is this important?   You won't feel high blood pressure, but it can still hurt your blood vessels.  High blood pressure can cause heart or kidney problems. It can also cause a stroke.  Making lifestyle changes like losing a little weight or eating less salt will help.  Checking your blood pressure at home and at different times of the day can help to control blood pressure.  If the doctor prescribes medicine remember to take it the way the doctor ordered.  Call the office if you cannot afford the medicine or if there are questions about it.     Notes:         This is a list of the screening recommended for you and due dates:  Health Maintenance  Topic Date Due   Zoster (Shingles) Vaccine (1 of 2) Never done   COVID-19 Vaccine (5 - 2023-24 season) 01/16/2022   Pneumonia Vaccine (1 of 1 - PCV) 12/02/2022*   Mammogram  10/16/2022   DTaP/Tdap/Td vaccine (2 - Td or Tdap) 11/05/2022   Medicare Annual Wellness Visit  07/16/2023   Flu Shot  Completed   DEXA scan (bone density measurement)  Completed   HPV Vaccine  Aged Out  *Topic was postponed. The  date shown is not the original due date.    Advanced directives: copies in chart   Conditions/risks identified: get that left leg to feeling better   Next appointment: Follow up in one year for your annual wellness visit    Preventive Care 65 Years and Older, Female Preventive care refers to lifestyle choices and visits with your health care provider that can promote health and wellness. What does preventive care include? A yearly physical exam. This is also called an annual well check. Dental exams once or twice a year. Routine eye exams. Ask your health care provider how often you should have your eyes checked. Personal lifestyle choices, including: Daily care of your teeth and gums. Regular physical activity. Eating a healthy diet. Avoiding tobacco and drug use. Limiting alcohol use. Practicing safe sex. Taking low-dose aspirin every day. Taking vitamin and mineral supplements as recommended by your health care provider. What happens during an annual well check?  The services and screenings done by your health care provider during your annual well check will depend on your age, overall health, lifestyle risk factors, and family history of disease. Counseling  Your health care provider may ask you questions about your: Alcohol use. Tobacco use. Drug use. Emotional well-being. Home and relationship well-being. Sexual activity. Eating habits. History of falls. Memory and ability to understand (cognition). Work and work Statistician. Reproductive health. Screening  You may have the following tests or measurements: Height, weight, and BMI. Blood pressure. Lipid and cholesterol levels. These may be checked every 5 years, or more frequently if you are over 40 years old. Skin check. Lung cancer screening. You may have this screening every year starting at age 45 if you have a 30-pack-year history of smoking and currently smoke or have quit within the past 15 years. Fecal  occult blood test (FOBT) of the stool. You may have this test every year starting at age 66. Flexible sigmoidoscopy or colonoscopy. You may have a sigmoidoscopy every 5 years or a colonoscopy every 10 years starting at age 74. Hepatitis C blood test. Hepatitis B blood test. Sexually transmitted disease (STD) testing. Diabetes screening. This is done by checking your blood sugar (glucose) after you have not eaten for a while (fasting). You may have this done every 1-3 years. Bone density scan. This is done to screen for osteoporosis. You may have this done starting at age 24. Mammogram. This may be done every 1-2 years. Talk to your health care provider about how often you should have regular mammograms. Talk with your health care provider about your test results, treatment options, and if necessary, the need for more tests. Vaccines  Your health care provider may recommend certain vaccines, such as: Influenza vaccine. This is recommended every year. Tetanus, diphtheria, and acellular pertussis (Tdap, Td) vaccine. You may need a Td booster every 10 years. Zoster vaccine. You may need this after age 6. Pneumococcal 13-valent conjugate (PCV13) vaccine. One dose is recommended after age 79. Pneumococcal polysaccharide (PPSV23) vaccine. One dose is recommended after age 62. Talk to your health care provider about which screenings and vaccines you need and how often you need them. This information is not intended to replace advice given to you by your health care provider. Make sure you discuss any questions you have with your health care provider. Document Released: 05/31/2015 Document Revised: 01/22/2016 Document Reviewed: 03/05/2015 Elsevier Interactive Patient Education  2017 [employer] Prevention in the Home Falls can cause injuries. They can happen to people of all ages. There are many things you can do to make your home safe and to help prevent falls. What can I do on the outside  of my home? Regularly fix the edges of walkways and driveways and fix any cracks. Remove anything that might make you trip as you walk through a door, such as a raised step or threshold. Trim any bushes or trees on the path to your home. Use bright outdoor lighting. Clear any walking paths of anything that might make someone trip, such as rocks or tools. Regularly check to see if handrails are loose or broken. Make sure that both sides of any steps have handrails. Any raised decks and porches should have guardrails on the edges. Have any leaves, snow, or ice cleared regularly. Use sand or salt on walking paths during winter. Clean up any spills in your garage right away. This includes oil or grease spills. What can I do in the  bathroom? Use night lights. Install grab bars by the toilet and in the tub and shower. Do not use towel bars as grab bars. Use non-skid mats or decals in the tub or shower. If you need to sit down in the shower, use a plastic, non-slip stool. Keep the floor dry. Clean up any water that spills on the floor as soon as it happens. Remove soap buildup in the tub or shower regularly. Attach bath mats securely with double-sided non-slip rug tape. Do not have throw rugs and other things on the floor that can make you trip. What can I do in the bedroom? Use night lights. Make sure that you have a light by your bed that is easy to reach. Do not use any sheets or blankets that are too big for your bed. They should not hang down onto the floor. Have a firm chair that has side arms. You can use this for support while you get dressed. Do not have throw rugs and other things on the floor that can make you trip. What can I do in the kitchen? Clean up any spills right away. Avoid walking on wet floors. Keep items that you use a lot in easy-to-reach places. If you need to reach something above you, use a strong step stool that has a grab bar. Keep electrical cords out of the  way. Do not use floor polish or wax that makes floors slippery. If you must use wax, use non-skid floor wax. Do not have throw rugs and other things on the floor that can make you trip. What can I do with my stairs? Do not leave any items on the stairs. Make sure that there are handrails on both sides of the stairs and use them. Fix handrails that are broken or loose. Make sure that handrails are as long as the stairways. Check any carpeting to make sure that it is firmly attached to the stairs. Fix any carpet that is loose or worn. Avoid having throw rugs at the top or bottom of the stairs. If you do have throw rugs, attach them to the floor with carpet tape. Make sure that you have a light switch at the top of the stairs and the bottom of the stairs. If you do not have them, ask someone to add them for you. What else can I do to help prevent falls? Wear shoes that: Do not have high heels. Have rubber bottoms. Are comfortable and fit you well. Are closed at the toe. Do not wear sandals. If you use a stepladder: Make sure that it is fully opened. Do not climb a closed stepladder. Make sure that both sides of the stepladder are locked into place. Ask someone to hold it for you, if possible. Clearly mark and make sure that you can see: Any grab bars or handrails. First and last steps. Where the edge of each step is. Use tools that help you move around (mobility aids) if they are needed. These include: Canes. Walkers. Scooters. Crutches. Turn on the lights when you go into a dark area. Replace any light bulbs as soon as they burn out. Set up your furniture so you have a clear path. Avoid moving your furniture around. If any of your floors are uneven, fix them. If there are any pets around you, be aware of where they are. Review your medicines with your doctor. Some medicines can make you feel dizzy. This can increase your chance of falling. Ask your doctor what other  things that you can  do to help prevent falls. This information is not intended to replace advice given to you by your health care provider. Make sure you discuss any questions you have with your health care provider. Document Released: 02/28/2009 Document Revised: 10/10/2015 Document Reviewed: 06/08/2014 Elsevier Interactive Patient Education  2017 [employer].

## 2022-07-20 ENCOUNTER — Encounter: Payer: Self-pay | Admitting: Family Medicine

## 2022-07-20 ENCOUNTER — Other Ambulatory Visit: Payer: Self-pay | Admitting: Family Medicine

## 2022-07-20 ENCOUNTER — Ambulatory Visit (INDEPENDENT_AMBULATORY_CARE_PROVIDER_SITE_OTHER): Payer: Medicare Other | Admitting: Family Medicine

## 2022-07-20 VITALS — BP 149/66 | HR 81 | Temp 97.1°F | Ht 64.0 in | Wt 116.4 lb

## 2022-07-20 DIAGNOSIS — I10 Essential (primary) hypertension: Secondary | ICD-10-CM

## 2022-07-20 DIAGNOSIS — K5712 Diverticulitis of small intestine without perforation or abscess without bleeding: Secondary | ICD-10-CM

## 2022-07-20 DIAGNOSIS — M1990 Unspecified osteoarthritis, unspecified site: Secondary | ICD-10-CM

## 2022-07-20 MED ORDER — PANTOPRAZOLE SODIUM 40 MG PO TBEC: 40.0000 mg | DELAYED_RELEASE_TABLET | Freq: Every day | ORAL | 0 refills | Status: DC

## 2022-07-20 NOTE — Assessment & Plan Note (Signed)
Pain is not controlled.  Advised her to discuss further with sports medicine.  It is okay for her to use topical voltaren.

## 2022-07-20 NOTE — Patient Instructions (Signed)
It was very nice to see you today!  We will start an acid blocker medication today called Protonix.  I will also refer you to see the gastroenterologist.  You can use Voltaren as needed for your joint pain.  Please discuss this with sports medicine.  We will see back in July for your annual checkup.  Come back sooner if needed.  Take care, Dr Jerline Pain  PLEASE NOTE:  If you had any lab tests, please let us know if you have not heard back within a few days. You may see your results on mychart before we have a chance to review them but we will give you a call once they are reviewed by [place].   If we ordered any referrals today, please let us know if you have not heard from their office within the next week.   If you had any urgent prescriptions sent in today, please check with the pharmacy within an hour of our visit to make sure the prescription was transmitted appropriately.   Please try these tips to maintain a healthy lifestyle:  Eat at least 3 REAL meals and 1-2 snacks per day.  Aim for no more than 5 hours between eating.  If you eat breakfast, please do so within one hour of getting up.   Each meal should contain half fruits/vegetables, one quarter protein, and one quarter carbs (no bigger than a computer mouse)  Cut down on sweet beverages. This includes juice, soda, and sweet tea.   Drink at least 1 glass of water with each meal and aim for at least 8 glasses per day  Exercise at least 150 minutes every week.

## 2022-07-20 NOTE — Assessment & Plan Note (Signed)
At goal per JNC 8 on losartan 50 mg daily and HCTZ 12.5 mg daily.

## 2022-07-20 NOTE — Progress Notes (Signed)
   Ellen [name] is a 82 y.o. female who presents today for an office visit.  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  Also with some worsening dyspepsia.  Concern for possible gastritis or PUD due to her NSAID use.  Will start Protonix 40 mg daily.    Her history is complicated by history of diverticulitis of jejunum that perforated several years ago.  Overall her abdominal exam today is reassuring though she does need GI follow up given her complicated history.  Will place referral today.  We discussed reasons to return to care and seek emergent care.  Avoid NSAIDs.  Hypertension At goal per JNC 8 on losartan 50 mg daily and HCTZ 12.5 mg daily.  Osteoarthritis Pain is not controlled.  Advised her to discuss further with sports medicine.  It is okay for her to use topical voltaren.      Subjective:  HPI:  See A/p for for status of chronic conditions.   Her main concern today is epigastric pain and bloating. This is consistent with her previous diverticulitus flare ups. Some nausea. No vomiting. She has also noticed significant increase in acid reflux the last several weeks.  She has taken an occasional over-the-counter acid blocker but is not sure if this has been helpful.  She has been taking daily Advil and tylenol for her arthritis pain.        Objective:  Physical Exam: BP (!) 149/66   Pulse 81   Temp (!) 97.1 F (36.2 C) (Temporal)   Ht '5\' 4"'$  (1.626 m)   Wt 116 lb 6.4 oz (52.8 kg)   SpO2 96%   BMI 19.98 kg/m   Gen: No acute distress, resting comfortably CV: Regular rate and rhythm with no murmurs appreciated Pulm: Normal work of breathing, clear to auscultation bilaterally with no crackles, wheezes, or rhonchi GI: Mild distention noted.  Bowel sounds present.  Nontender.  No rebound or guarding. Neuro: Grossly normal, moves all  extremities Psych: Normal affect and thought content      Augie Vane M. Jerline Pain, MD 07/20/2022 9:11 AM

## 2022-07-20 NOTE — Assessment & Plan Note (Signed)
Patient with worsening bloating and epigastric pain.  Also with some worsening dyspepsia.  Concern for possible gastritis or PUD due to her NSAID use.  Will start Protonix 40 mg daily.    Her history is complicated by history of diverticulitis of jejunum that perforated several years ago.  Overall her abdominal exam today is reassuring though she does need GI follow up given her complicated history.  Will place referral today.  We discussed reasons to return to care and seek emergent care.  Avoid NSAIDs.

## 2022-07-22 NOTE — Progress Notes (Unsigned)
    Ellen [name] D.[place] Phone: [phone]   Assessment and Plan:     There are no diagnoses linked to this encounter.  ***   Pertinent previous records reviewed include ***   Follow Up: ***     Subjective:   I, Ellen [name], am serving as a Education administrator for Doctor [name]   Chief Complaint: left hip pain    HPI:    10/21/2021 Patient is a 82 year old female complaining of left hip pain. 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    11/04/2021 Patient states that she is better , "the damn hamstrings still hurt " its more of an annoying pain but better than when she came and saw [place]   12/30/2021 Patient states that she is the same, still hurting     01/20/2022 Patient states that its better than it was but its still there   02/12/2022 Patient states that she is the same    03/19/2022 Patient states that PT is going into a new phase , still has a little pain not as bad    04/16/2022 Patient states she is better not 100% and she doesn't think it will get there    06/04/2022 Patient states she is about 70% better    07/23/2022 Patient states   Relevant Historical Information: None pertinent Additional pertinent review of systems negative.   Current Outpatient Medications:    hydrochlorothiazide (MICROZIDE) 12.5 MG capsule, TAKE 1 CAPSULE(12.5 MG) BY MOUTH DAILY, Disp: 90 capsule, Rfl: 1   losartan (COZAAR) 50 MG tablet, TAKE 1 TABLET(50 MG) BY MOUTH DAILY, Disp: 90 tablet, Rfl: 1   pantoprazole (PROTONIX) 40 MG tablet, TAKE 1 TABLET(40  MG) BY MOUTH DAILY, Disp: 90 tablet, Rfl: 1   PREMPRO 0.45-1.5 MG tablet, TAKE 1 TABLET BY MOUTH DAILY, Disp: 28 tablet, Rfl: 0   triamcinolone cream (KENALOG) 0.5 %, Mix 1:1 with lotion and apply twice daily for 1-2 weeks., Disp: 454 g, Rfl: 0   Objective:     There were no vitals filed for this visit.    There is no height or weight on file to calculate BMI.    Physical Exam:    ***   Electronically signed by:  Ellen [name] D.[name] 8:38 AM 07/22/22

## 2022-07-23 ENCOUNTER — Ambulatory Visit (INDEPENDENT_AMBULATORY_CARE_PROVIDER_SITE_OTHER): Payer: Medicare Other | Admitting: Sports Medicine

## 2022-07-23 VITALS — BP 110/80 | HR 72 | Ht 64.0 in | Wt 116.0 lb

## 2022-07-23 DIAGNOSIS — M1612 Unilateral primary osteoarthritis, left hip: Secondary | ICD-10-CM | POA: Diagnosis not present

## 2022-07-23 DIAGNOSIS — M25552 Pain in left hip: Secondary | ICD-10-CM | POA: Diagnosis not present

## 2022-07-23 DIAGNOSIS — S76012D Strain of muscle, fascia and tendon of left hip, subsequent encounter: Secondary | ICD-10-CM

## 2022-07-23 NOTE — Patient Instructions (Addendum)
Good to see you Recommend using tylenol 500-100 mg at night to see if that helps with the morning pain Continue HEP and Voltaren gel  Follow up 6-8 weeks

## 2022-08-15 ENCOUNTER — Other Ambulatory Visit: Payer: Self-pay | Admitting: Family Medicine

## 2022-08-18 ENCOUNTER — Other Ambulatory Visit: Payer: Self-pay | Admitting: *Deleted

## 2022-08-18 MED ORDER — PREMPRO 0.45-1.5 MG PO TABS: 1.0000 | ORAL_TABLET | Freq: Every day | ORAL | 0 refills | Status: DC

## 2022-08-26 ENCOUNTER — Telehealth: Payer: Self-pay | Admitting: Gastroenterology

## 2022-08-26 ENCOUNTER — Encounter: Payer: Self-pay | Admitting: Gastroenterology

## 2022-08-26 ENCOUNTER — Ambulatory Visit (INDEPENDENT_AMBULATORY_CARE_PROVIDER_SITE_OTHER): Payer: Medicare Other | Admitting: Gastroenterology

## 2022-08-26 VITALS — BP 110/60 | HR 70 | Ht 64.0 in | Wt 116.0 lb

## 2022-08-26 DIAGNOSIS — K638219 Small intestinal bacterial overgrowth, unspecified: Secondary | ICD-10-CM | POA: Diagnosis not present

## 2022-08-26 DIAGNOSIS — K219 Gastro-esophageal reflux disease without esophagitis: Secondary | ICD-10-CM | POA: Diagnosis not present

## 2022-08-26 DIAGNOSIS — R140 Abdominal distension (gaseous): Secondary | ICD-10-CM | POA: Diagnosis not present

## 2022-08-26 DIAGNOSIS — Z791 Long term (current) use of non-steroidal anti-inflammatories (NSAID): Secondary | ICD-10-CM

## 2022-08-26 MED ORDER — DOXYCYCLINE MONOHYDRATE 100 MG PO CAPS: 100.0000 mg | ORAL_CAPSULE | Freq: Two times a day (BID) | ORAL | 0 refills | Status: DC

## 2022-08-26 MED ORDER — RIFAXIMIN 550 MG PO TABS: 550.0000 mg | ORAL_TABLET | Freq: Three times a day (TID) | ORAL | 0 refills | Status: AC

## 2022-08-26 NOTE — Telephone Encounter (Signed)
This patient doesn't have pharmacy insurance she only uses discount cards and we don't have any samples of the xifaxan. Please advise as to what I need to send in?

## 2022-08-26 NOTE — Telephone Encounter (Signed)
Patient called requested to speak with someone regarding the Xifaxan medication because it is too expensive for her.

## 2022-08-26 NOTE — Patient Instructions (Addendum)
_______________________________________________________  If your blood pressure at your visit was 140/90 or greater, please contact your primary care physician to follow up on this.  _______________________________________________________  If you are age 84 or older, your body mass index should be between 23-30. Your Body mass index is 19.91 kg/m. If this is out of the aforementioned range listed, please consider follow up with your Primary Care Provider.  If you are age 61 or younger, your body mass index should be between 19-25. Your Body mass index is 19.91 kg/m. If this is out of the aformentioned range listed, please consider follow up with your Primary Care Provider.   ________________________________________________________  The [facility] providers would like to encourage you to use [facility] to communicate with providers for non-urgent requests or questions.  Due to long hold times on the telephone, sending your provider a message by [facility] may be a faster and more efficient way to get a response.  Please allow 48 business hours for a response.  Please remember that this is for non-urgent requests.  _______________________________________________________  We have sent the following medications to your pharmacy for you to pick up at your convenience: Xifaxan 550mg  3 times for 14 days  You have been scheduled for an endoscopy. Please follow written instructions given to you at your visit today. If you use inhalers (even only as needed), please bring them with you on the day of your procedure.  Stop aleve  It was a pleasure to see you today!  Thank you for trusting me with your gastrointestinal care!

## 2022-08-26 NOTE — Progress Notes (Signed)
08/26/2022 Ellen [name] [id number] 1941/03/18   HISTORY OF PRESENT ILLNESS: This is a pleasant 82 year old female who is a patient of Dr. Orvan Falconer.  She has a history of hypertension, venous insufficiency, recurrent episodes of diverticulitis requiring small bowel resection for perforated jejunal diverticulum and 2015.  She actually had CT proven jejunal diverticulitis in 2016 involving a 3.5 cm diverticulum.  Imaging also showed colonic diverticulosis.  Today she complains of her abdomen always feeling a lot of bloating.  She knows that it is at least in part due to this large hernia that she has in her mid abdomen related to her laparotomy in 2015.  But also complains of a lot of noise and gas in her abdomen.  She overall she moves her bowels well.  Her appetite is good.  She apparently has never had a colonoscopy or an upper endoscopy in the past and she had declined these previously.  CT scan in December 2021 showed no acute findings.  She does take NSAIDs in the form of Aleve daily since probably about 2011 since her shoulder surgery.  She was treated with Xifaxan back in 2022 for 14 days which we provided samples for her and she thinks that that did help with her symptoms to some degree.  There is a lot more discomfort particularly in her mid abdomen/hernia area after she been doing a lot of activity with gardening, etc.  She has been having some upper abdominal discomfort and complaining of a lot of acid reflux issues recently and saw her PCP who put her on pantoprazole 40 mg daily a few weeks ago and she says that that has been helping some.   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 Her smoking use included cigarettes. She has never used smokeless tobacco. She reports current alcohol use of about 7.0 standard drinks of alcohol per week. She reports that she does not use drugs. family history includes CAD in her father; Diabetes in her mother; Heart attack in her father and mother. Allergies  Allergen Reactions   Lactose Intolerance (Gi) Diarrhea   Sulfa Antibiotics Nausea And Vomiting and Swelling      Outpatient Encounter Medications as of 08/26/2022  Medication Sig   amoxicillin (AMOXIL) 500 MG tablet Take 2,000 mg by mouth as needed. She pre-meds prior to procedures due to total shoulder replacement   estrogen, conjugated,-medroxyprogesterone (PREMPRO) 0.45-1.5 MG tablet Take 1 tablet by mouth daily.   hydrochlorothiazide (MICROZIDE) 12.5 MG capsule TAKE 1 CAPSULE(12.5 MG) BY MOUTH DAILY   losartan (COZAAR) 50 MG tablet TAKE 1 TABLET(50 MG) BY MOUTH DAILY   pantoprazole (PROTONIX) 40 MG tablet TAKE 1 TABLET(40 MG) BY MOUTH DAILY   [DISCONTINUED] triamcinolone cream (KENALOG) 0.5 % Mix 1:1 with lotion and apply twice daily for 1-2 weeks.   No facility-administered encounter medications on file as of 08/26/2022.  REVIEW OF SYSTEMS  : All other systems reviewed and negative except where noted in the History of Present Illness.   PHYSICAL EXAM: BP 110/60   Pulse 70   Ht 5\' 4"  (1.626 m)   Wt 116 lb (52.6 kg)   BMI 19.91 kg/m  General: Well developed white female in no acute distress Head: Normocephalic and atraumatic Eyes:  Sclerae anicteric, conjunctiva pink. Ears: Normal auditory acuity Lungs: Clear throughout to auscultation; no W/R/R. Heart: Regular rate and rhythm; no M/R/G. Abdomen: Soft, non-distended.   Scars from surgery noted along with hernia in mid-abdomen as well.  Non-tender.  BS present. Musculoskeletal: Symmetrical with no gross deformities  Skin: No lesions on visible extremities Extremities: No edema  Neurological: Alert oriented x 4, grossly non-focal Psychological:  Alert and cooperative. Normal mood and affect  ASSESSMENT AND PLAN: *GERD with longstanding history of NSAID use and some upper abdominal pain: Recently started on pantoprazole 40 mg daily by her PCP and has had some improvement with that.  We discussed discontinuing her daily Aleve that she has been taking for the last 11 years.  She is willing to do that.  Will plan for an EGD to evaluate.  This is being scheduled with Dr. Leonides Schanz. *Diverticulosis with history of recurrent complicated diverticulitis involving the small bowel s/p resection for perforation in 2015 *Gas, bloating, flatus: Was treated with Xifaxan 550 mg 3 times daily x 14 days a couple years ago for possible SIBO and she believes that that did help some.  Will submit for that again.  It was expensive last time but we were able to provide her with samples.  If we cannot do that we could try an alternative such as doxycycline 100 mg twice daily for 14 days.  Prescription for Xifaxan was sent in.  Certainly some of her sensation of fullness in her abdomen is likely due to her very obvious hernia that is there related to her previous surgery/incision sites.  She has seen 2 surgeons in they have declined to do surgery but she is adamant that she would like it fixed.  **The risks, benefits, and alternatives to EGD were discussed with the patient and she consents to proceed.   CC:  Ardith Dark, MD

## 2022-08-26 NOTE — Telephone Encounter (Signed)
Spoke with patient and sent medication in and she will call with any questions or concerns.

## 2022-08-27 NOTE — Progress Notes (Signed)
I agree with the assessment and plan as outlined by Ms. Zehr. 

## 2022-09-02 NOTE — Progress Notes (Unsigned)
    Ellen [name] D.[name] [address] [place] [address] Phone: [phone]   Assessment and Plan:     There are no diagnoses linked to this encounter.  ***   Pertinent previous records reviewed include ***   Follow Up: ***     Subjective:   I, Ellen [name], am serving as a Neurosurgeon for Doctor Ellen [name]   Chief Complaint: left hip pain    HPI:    10/21/2021 Patient is a 82 year old female complaining of left hip pain. 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    11/04/2021 Patient states that she is better , "the damn hamstrings still hurt " its more of an annoying pain but better than when she came and saw [place]   12/30/2021 Patient states that she is the same, still hurting     01/20/2022 Patient states that its better than it was but its still there   02/12/2022 Patient states that she is the same    03/19/2022 Patient states that PT is going into a new phase , still has a little pain not as bad    04/16/2022 Patient states she is better not 100% and she doesn't think it will get there    06/04/2022 Patient states she is about 70% better    07/23/2022 Patient states she is still in pain , says its just going to be there   09/03/2022 Patient states    Relevant Historical Information: None pertinent  Additional pertinent review of systems negative.   Current Outpatient Medications:    amoxicillin (AMOXIL) 500 MG tablet, Take 2,000 mg by mouth as needed. She pre-meds prior to procedures due to total shoulder replacement, Disp: , Rfl:    doxycycline (MONODOX)  100 MG capsule, Take 1 capsule (100 mg total) by mouth 2 (two) times daily. For 14 days, Disp: 28 capsule, Rfl: 0   estrogen, conjugated,-medroxyprogesterone (PREMPRO) 0.45-1.5 MG tablet, Take 1 tablet by mouth daily., Disp: 28 tablet, Rfl: 0   hydrochlorothiazide (MICROZIDE) 12.5 MG capsule, TAKE 1 CAPSULE(12.5 MG) BY MOUTH DAILY, Disp: 90 capsule, Rfl: 1   losartan (COZAAR) 50 MG tablet, TAKE 1 TABLET(50 MG) BY MOUTH DAILY, Disp: 90 tablet, Rfl: 1   pantoprazole (PROTONIX) 40 MG tablet, TAKE 1 TABLET(40 MG) BY MOUTH DAILY, Disp: 90 tablet, Rfl: 1   rifaximin (XIFAXAN) 550 MG TABS tablet, Take 1 tablet (550 mg total) by mouth 3 (three) times daily for 14 days., Disp: 42 tablet, Rfl: 0   Objective:     There were no vitals filed for this visit.    There is no height or weight on file to calculate BMI.    Physical Exam:    ***   Electronically signed by:  Ellen [name] D.[name] 7:25 AM 09/02/22

## 2022-09-03 ENCOUNTER — Ambulatory Visit (INDEPENDENT_AMBULATORY_CARE_PROVIDER_SITE_OTHER): Payer: Medicare Other | Admitting: Sports Medicine

## 2022-09-03 VITALS — HR 97 | Ht 64.0 in | Wt 117.0 lb

## 2022-09-03 DIAGNOSIS — M25552 Pain in left hip: Secondary | ICD-10-CM | POA: Diagnosis not present

## 2022-09-03 DIAGNOSIS — M1612 Unilateral primary osteoarthritis, left hip: Secondary | ICD-10-CM | POA: Diagnosis not present

## 2022-09-03 DIAGNOSIS — M7632 Iliotibial band syndrome, left leg: Secondary | ICD-10-CM

## 2022-09-03 DIAGNOSIS — M7062 Trochanteric bursitis, left hip: Secondary | ICD-10-CM | POA: Diagnosis not present

## 2022-09-03 DIAGNOSIS — S76012D Strain of muscle, fascia and tendon of left hip, subsequent encounter: Secondary | ICD-10-CM

## 2022-09-03 NOTE — Patient Instructions (Addendum)
Good to see you Continue HEP  Voltaren gel over areas of pain  3-4 week follow up

## 2022-09-23 NOTE — Progress Notes (Deleted)
    Ellen [name] D.[facility] [address] [place] [address] Phone: [phone]   Assessment and Plan:     There are no diagnoses linked to this encounter.  ***   Pertinent previous records reviewed include ***   Follow Up: ***     Subjective:   I, Ellen [name], am serving as a Neurosurgeon for Doctor Richardean Sale   Chief Complaint: left hip pain    HPI:    10/21/2021 Patient is a 82 year old female complaining of left hip pain. 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    11/04/2021 Patient states that she is better , "the damn hamstrings still hurt " its more of an annoying pain but better than when she came and saw [place]   12/30/2021 Patient states that she is the same, still hurting     01/20/2022 Patient states that its better than it was but its still there   02/12/2022 Patient states that she is the same    03/19/2022 Patient states that PT is going into a new phase , still has a little pain not as bad    04/16/2022 Patient states she is better not 100% and she doesn't think it will get there    06/04/2022 Patient states she is about 70% better    07/23/2022 Patient states she is still in pain , says its just going to be there    09/03/2022 Patient states she has pain   09/24/2022 Patient states      Relevant Historical Information: None pertinent  Additional pertinent review of systems negative.   Current Outpatient Medications:    amoxicillin (AMOXIL) 500 MG tablet, Take 2,000 mg by mouth as needed. She pre-meds prior to procedures due to total shoulder  replacement, Disp: , Rfl:    doxycycline (MONODOX) 100 MG capsule, Take 1 capsule (100 mg total) by mouth 2 (two) times daily. For 14 days, Disp: 28 capsule, Rfl: 0   estrogen, conjugated,-medroxyprogesterone (PREMPRO) 0.45-1.5 MG tablet, Take 1 tablet by mouth daily., Disp: 28 tablet, Rfl: 0   hydrochlorothiazide (MICROZIDE) 12.5 MG capsule, TAKE 1 CAPSULE(12.5 MG) BY MOUTH DAILY, Disp: 90 capsule, Rfl: 1   losartan (COZAAR) 50 MG tablet, TAKE 1 TABLET(50 MG) BY MOUTH DAILY, Disp: 90 tablet, Rfl: 1   pantoprazole (PROTONIX) 40 MG tablet, TAKE 1 TABLET(40 MG) BY MOUTH DAILY, Disp: 90 tablet, Rfl: 1   Objective:     There were no vitals filed for this visit.    There is no height or weight on file to calculate BMI.    Physical Exam:    ***   Electronically signed by:  Ellen [name] D.[facility] 7:31 AM 09/23/22

## 2022-09-24 ENCOUNTER — Ambulatory Visit: Payer: Medicare Other | Admitting: Sports Medicine

## 2022-09-30 NOTE — Progress Notes (Signed)
    Ellen [name] D.[name] [address] [place] [address] Phone: [phone]   Assessment and Plan:     There are no diagnoses linked to this encounter.  ***   Pertinent previous records reviewed include ***   Follow Up: ***     Subjective:   I, Ellen [name], am serving as a Neurosurgeon for Doctor Ellen [name]   Chief Complaint: left hip pain    HPI:    10/21/2021 Patient is a 82 year old female complaining of left hip pain. 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    11/04/2021 Patient states that she is better , "the damn hamstrings still hurt " its more of an annoying pain but better than when she came and saw [place]   12/30/2021 Patient states that she is the same, still hurting     01/20/2022 Patient states that its better than it was but its still there   02/12/2022 Patient states that she is the same    03/19/2022 Patient states that PT is going into a new phase , still has a little pain not as bad    04/16/2022 Patient states she is better not 100% and she doesn't think it will get there    06/04/2022 Patient states she is about 70% better    07/23/2022 Patient states she is still in pain , says its just going to be there    09/03/2022 Patient states she has pain    10/01/2022 Patient states    Relevant Historical Information: None pertinent  Additional pertinent review of systems negative.   Current Outpatient Medications:    amoxicillin (AMOXIL) 500 MG tablet, Take 2,000 mg by mouth as needed. She pre-meds prior to procedures due to total shoulder  replacement, Disp: , Rfl:    doxycycline (MONODOX) 100 MG capsule, Take 1 capsule (100 mg total) by mouth 2 (two) times daily. For 14 days, Disp: 28 capsule, Rfl: 0   estrogen, conjugated,-medroxyprogesterone (PREMPRO) 0.45-1.5 MG tablet, Take 1 tablet by mouth daily., Disp: 28 tablet, Rfl: 0   hydrochlorothiazide (MICROZIDE) 12.5 MG capsule, TAKE 1 CAPSULE(12.5 MG) BY MOUTH DAILY, Disp: 90 capsule, Rfl: 1   losartan (COZAAR) 50 MG tablet, TAKE 1 TABLET(50 MG) BY MOUTH DAILY, Disp: 90 tablet, Rfl: 1   pantoprazole (PROTONIX) 40 MG tablet, TAKE 1 TABLET(40 MG) BY MOUTH DAILY, Disp: 90 tablet, Rfl: 1   Objective:     There were no vitals filed for this visit.    There is no height or weight on file to calculate BMI.    Physical Exam:    ***   Electronically signed by:  Ellen [name] D.[name] 7:26 AM 09/30/22

## 2022-10-01 ENCOUNTER — Ambulatory Visit (INDEPENDENT_AMBULATORY_CARE_PROVIDER_SITE_OTHER): Payer: Medicare Other | Admitting: Sports Medicine

## 2022-10-01 VITALS — HR 76 | Ht 64.0 in | Wt 116.0 lb

## 2022-10-01 DIAGNOSIS — R0989 Other specified symptoms and signs involving the circulatory and respiratory systems: Secondary | ICD-10-CM

## 2022-10-01 DIAGNOSIS — M25552 Pain in left hip: Secondary | ICD-10-CM | POA: Diagnosis not present

## 2022-10-01 DIAGNOSIS — M1612 Unilateral primary osteoarthritis, left hip: Secondary | ICD-10-CM | POA: Diagnosis not present

## 2022-10-01 DIAGNOSIS — M7632 Iliotibial band syndrome, left leg: Secondary | ICD-10-CM

## 2022-10-01 NOTE — Patient Instructions (Addendum)
[facility]  Tylenol [phone] mg 2-3 times a day for pain relief  Voltaren gel over areas of pain  May use aleve 1-2 times a week maximum for breakthrough pain  6 week follow up

## 2022-10-07 ENCOUNTER — Encounter: Payer: Self-pay | Admitting: Internal Medicine

## 2022-10-07 ENCOUNTER — Other Ambulatory Visit: Payer: Self-pay | Admitting: Internal Medicine

## 2022-10-07 ENCOUNTER — Ambulatory Visit (AMBULATORY_SURGERY_CENTER): Payer: Medicare Other | Admitting: Internal Medicine

## 2022-10-07 VITALS — BP 161/70 | HR 70 | Temp 98.6°F | Resp 17 | Ht 64.0 in | Wt 116.0 lb

## 2022-10-07 DIAGNOSIS — K317 Polyp of stomach and duodenum: Secondary | ICD-10-CM

## 2022-10-07 DIAGNOSIS — K2970 Gastritis, unspecified, without bleeding: Secondary | ICD-10-CM | POA: Diagnosis not present

## 2022-10-07 DIAGNOSIS — I10 Essential (primary) hypertension: Secondary | ICD-10-CM | POA: Diagnosis not present

## 2022-10-07 DIAGNOSIS — K219 Gastro-esophageal reflux disease without esophagitis: Secondary | ICD-10-CM | POA: Diagnosis not present

## 2022-10-07 DIAGNOSIS — F419 Anxiety disorder, unspecified: Secondary | ICD-10-CM | POA: Diagnosis not present

## 2022-10-07 DIAGNOSIS — K319 Disease of stomach and duodenum, unspecified: Secondary | ICD-10-CM | POA: Diagnosis not present

## 2022-10-07 DIAGNOSIS — R140 Abdominal distension (gaseous): Secondary | ICD-10-CM | POA: Diagnosis not present

## 2022-10-07 MED ORDER — SODIUM CHLORIDE 0.9 % IV SOLN: 500.0000 mL | Freq: Once | INTRAVENOUS | Status: DC

## 2022-10-07 MED ORDER — PANTOPRAZOLE SODIUM 40 MG PO TBEC: 40.0000 mg | DELAYED_RELEASE_TABLET | Freq: Every day | ORAL | 1 refills | Status: DC

## 2022-10-07 NOTE — Op Note (Signed)
[facility] Patient Name: Ellen [name] Procedure Date: 10/07/2022 12:11 PM MRN: [id number] Endoscopist: Particia Lather , , [id number] Age: 82 Referring MD:  Date of Birth: 06/28/1940 Gender: Female Account #: [id number] Procedure:                Upper GI endoscopy Indications:              Epigastric abdominal pain, Heartburn Medicines:                Monitored Anesthesia Care Procedure:                Pre-Anesthesia Assessment:                           - Prior to the procedure, a History and Physical                            was performed, and patient medications and                            allergies were reviewed. The patient's tolerance of                            previous anesthesia was also reviewed. The risks                            and benefits of the procedure and the sedation                            options and risks were discussed with the patient.                            All questions were answered, and informed consent                            was obtained. Prior Anticoagulants: The patient has                            taken no anticoagulant or antiplatelet agents. ASA                            Grade Assessment: II - A patient with mild systemic                            disease. After reviewing the risks and benefits,                            the patient was deemed in satisfactory condition to                            undergo the procedure.                           After obtaining informed consent, the endoscope was  passed under direct vision. Throughout the                            procedure, the patient's blood pressure, pulse, and                            oxygen saturations were monitored continuously. The                            Olympus Scope [id number] was introduced through the                            mouth, and advanced to the second part of duodenum.                            The  upper GI endoscopy was accomplished without                            difficulty. The patient tolerated the procedure                            well. Scope In: Scope Out: Findings:                 The examined esophagus was normal.                           Four 3 to 6 mm sessile polyps with no bleeding and                            no stigmata of recent bleeding were found in the                            gastric body. These polyps were removed with a cold                            snare. Resection and retrieval were complete.                           Diffuse moderate inflammation characterized by                            congestion (edema), erythema and granularity was                            found in the gastric body and in the gastric                            antrum. Biopsies were taken with a cold forceps for                            histology.                           The examined duodenum  was normal. Complications:            No immediate complications. Estimated Blood Loss:     Estimated blood loss was minimal. Impression:               - Normal esophagus.                           - Four gastric polyps. Resected and retrieved.                           - Gastritis. Biopsied.                           - Normal examined duodenum. Recommendation:           - Discharge patient to home (with escort).                           - Use Protonix (pantoprazole) 40 mg PO daily for 8                            weeks.                           - Await pathology results.                           - Avoid NSAIDs if possible.                           - The findings and recommendations were discussed                            with the patient.                           - Return to GI clinic in 3-6 months. Dr Particia Lather "Alan Ripper" [place],  10/07/2022 12:46:51 PM

## 2022-10-07 NOTE — Progress Notes (Signed)
GASTROENTEROLOGY PROCEDURE H&P NOTE   Primary Care Physician: Ardith Dark, MD    Reason for Procedure:   Epigastric ab pain, GERD  Plan:    EGD  Patient is appropriate for endoscopic procedure(s) in the ambulatory ([facility]) setting.  The nature of the procedure, as well as the risks, benefits, and alternatives were carefully and thoroughly reviewed with the patient. Ample time for discussion and questions allowed. The patient understood, was satisfied, and agreed to proceed.     HPI: Ellen [name] is a 82 y.o. female who presents for EGD for evaluation of epigastric ab pain, GERD .  Patient was most recently seen in the Gastroenterology Clinic on 08/26/22.  No interval change in medical history since that appointment. Please refer to that note for full details regarding GI history and clinical presentation.   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 Authorizing Provider  estrogen, conjugated,-medroxyprogesterone (PREMPRO) 0.45-1.5 MG tablet Take 1 tablet by mouth daily. 08/18/22  Yes Ardith Dark, MD  hydrochlorothiazide (MICROZIDE) 12.5 MG capsule TAKE 1 CAPSULE(12.5 MG) BY MOUTH DAILY 06/29/22  Yes Ardith Dark, MD  losartan (COZAAR) 50 MG tablet TAKE 1 TABLET(50  MG) BY MOUTH DAILY 06/29/22  Yes Ardith Dark, MD    Current Outpatient Medications  Medication Sig Dispense Refill   estrogen, conjugated,-medroxyprogesterone (PREMPRO) 0.45-1.5 MG tablet Take 1 tablet by mouth daily. 28 tablet 0   hydrochlorothiazide (MICROZIDE) 12.5 MG capsule TAKE 1 CAPSULE(12.5 MG) BY MOUTH DAILY 90 capsule 1   losartan (COZAAR) 50 MG tablet TAKE 1 TABLET(50 MG) BY MOUTH DAILY 90 tablet 1   No current facility-administered medications for this visit.    Allergies as of 10/07/2022 - Review Complete 10/07/2022  Allergen Reaction Noted   Lactose intolerance (gi) Diarrhea 03/10/2014   Sulfa antibiotics Nausea And Vomiting and Swelling 07/09/2013    Family History  Problem Relation Age of Onset   Diabetes Mother    Heart attack Mother    CAD Father    Heart attack Father    Breast cancer Neg Hx    Colon cancer Neg Hx    Esophageal cancer Neg Hx    Rectal cancer Neg Hx    Stomach cancer Neg Hx     Social History   Socioeconomic History   Marital status: Married    Spouse name: Not on file   Number of children: 0   Years of education: Not on file   Highest education level: Not on file  Occupational History   Occupation: Retired   Tobacco Use   Smoking status: Former    Types: Cigarettes   Smokeless tobacco: Never  Advertising account planner  Vaping Use: Never used  Substance and Sexual Activity   Alcohol use: Yes    Alcohol/week: 7.0 standard drinks of alcohol    Types: 7 Glasses of wine per week   Drug use: No   Sexual activity: Not Currently  Other Topics Concern   Not on file  Social History Narrative   Enjoys gardening    Social Determinants of Health   Financial Resource Strain: Low Risk  (07/16/2022)   Overall Financial Resource Strain (CARDIA)    Difficulty of Paying Living Expenses: Not hard at all  Food Insecurity: No Food Insecurity (07/16/2022)   Hunger Vital Sign    Worried About Running Out of Food in the Last Year: Never true    Ran Out  of Food in the Last Year: Never true  Transportation Needs: No Transportation Needs (07/16/2022)   PRAPARE - Administrator, Civil Service (Medical): No    Lack of Transportation (Non-Medical): No  Physical Activity: Sufficiently Active (07/16/2022)   Exercise Vital Sign    Days of Exercise per Week: 5 days    Minutes of Exercise per Session: 120 min  Stress: No Stress Concern Present (07/16/2022)   [employer] of Occupational Health - Occupational Stress Questionnaire    Feeling of Stress : Not at all  Social Connections: Moderately Integrated (07/16/2022)   Social Connection and Isolation Panel [NHANES]    Frequency of Communication with Friends and Family: More than three times a week    Frequency of Social Gatherings with Friends and Family: More than three times a week    Attends Religious Services: More than 4 times per year    Active Member of [employer] or Organizations: No    Attends Banker Meetings: Never    Marital Status: Married  Catering manager Violence: Not At Risk (07/16/2022)   Humiliation, Afraid, Rape, and Kick questionnaire    Fear of Current or Ex-Partner: No    Emotionally Abused: No    Physically Abused: No    Sexually Abused: No    Physical Exam: Vital signs in last 24 hours: BP (!) 148/84   Pulse 73   Temp 98.6 F (37 C) (Skin)   Ht 5\' 4"  (1.626 m)   Wt 116 lb (52.6 kg)   SpO2 96%   BMI 19.91 kg/m  GEN: NAD EYE: Sclerae anicteric ENT: MMM CV: Non-tachycardic Pulm: No increased WOB GI: Soft NEURO:  Alert & Oriented   Eulah Pont, MD [facility]   10/07/2022 11:49 AM

## 2022-10-07 NOTE — Progress Notes (Signed)
Patient states there have been no changes to medical or surgical history since time of pre-visit. 

## 2022-10-07 NOTE — Patient Instructions (Addendum)
Handout provided on gastritis.  Resume previous diet.  Continue present medications.  Use Protonix (pantoprazole) 40mg  by mouth daily for 8 weeks. Prescription sent to your pharmacy.  Avoid aspirin, ibuprofen, naproxen, or other non-steriodal anti-inflammatory drugs (NSAIDs). You may use Tylenol if needed for mild pain or fever.   Return to GI clinic in 3-6 months. See appointment scheduled today.   YOU HAD AN ENDOSCOPIC PROCEDURE TODAY AT THE [facility]:   Refer to the procedure report that was given to you for any specific questions about what was found during the examination.  If the procedure report does not answer your questions, please call your gastroenterologist to clarify.  If you requested that your care partner not be given the details of your procedure findings, then the procedure report has been included in a sealed envelope for you to review at your convenience later.  YOU SHOULD EXPECT: Some feelings of bloating in the abdomen. Passage of more gas than usual.  Walking can help get rid of the air that was put into your GI tract during the procedure and reduce the bloating. If you had a lower endoscopy (such as a colonoscopy or flexible sigmoidoscopy) you may notice spotting of blood in your stool or on the toilet paper. If you underwent a bowel prep for your procedure, you may not have a normal bowel movement for a few days.  Please Note:  You might notice some irritation and congestion in your nose or some drainage.  This is from the oxygen used during your procedure.  There is no need for concern and it should clear up in a day or so.  SYMPTOMS TO REPORT IMMEDIATELY:  Following upper endoscopy (EGD)  Vomiting of blood or coffee ground material  New chest pain or pain under the shoulder blades  Painful or persistently difficult swallowing  New shortness of breath  Fever of 100F or higher  Black, tarry-looking stools  For urgent or emergent issues, a  gastroenterologist can be reached at any hour by calling [phone]. Do not use MyChart messaging for urgent concerns.    DIET:  We do recommend a small meal at first, but then you may proceed to your regular diet.  Drink plenty of fluids but you should avoid alcoholic beverages for 24 hours.  ACTIVITY:  You should plan to take it easy for the rest of today and you should NOT DRIVE or use heavy machinery until tomorrow (because of the sedation medicines used during the test).    FOLLOW UP: Our staff will call the number listed on your records the next business day following your procedure.  We will call around 7:15- 8:00 am to check on you and address any questions or concerns that you may have regarding the information given to you following your procedure. If we do not reach you, we will leave a message.     If any biopsies were taken you will be contacted by phone or by letter within the next 1-3 weeks.  Please call us at [phone] if you have not heard about the biopsies in 3 weeks.    SIGNATURES/CONFIDENTIALITY: You and/or your care partner have signed paperwork which will be entered into your electronic medical record.  These signatures attest to the fact that that the information above on your After Visit Summary has been reviewed and is understood.  Full responsibility of the confidentiality of this discharge information lies with you and/or your care-partner.

## 2022-10-07 NOTE — Progress Notes (Signed)
Called to room to assist during endoscopic procedure.  Patient ID and intended procedure confirmed with present staff. Received instructions for my participation in the procedure from the performing physician.  

## 2022-10-07 NOTE — Progress Notes (Signed)
Vss nad trans to pacu 

## 2022-10-08 ENCOUNTER — Telehealth: Payer: Self-pay

## 2022-10-08 NOTE — Telephone Encounter (Signed)
Left message on follow up call. 

## 2022-10-13 ENCOUNTER — Encounter: Payer: Self-pay | Admitting: Internal Medicine

## 2022-10-21 ENCOUNTER — Encounter: Payer: Medicare Other | Admitting: Vascular Surgery

## 2022-10-23 ENCOUNTER — Other Ambulatory Visit: Payer: Self-pay | Admitting: Family Medicine

## 2022-10-23 DIAGNOSIS — Z1231 Encounter for screening mammogram for malignant neoplasm of breast: Secondary | ICD-10-CM

## 2022-10-29 ENCOUNTER — Ambulatory Visit
Admission: RE | Admit: 2022-10-29 | Discharge: 2022-10-29 | Disposition: A | Discharge status: Home / Self Care | Payer: Medicare Other | Source: Ambulatory Visit | Attending: Family Medicine | Admitting: Family Medicine

## 2022-10-29 DIAGNOSIS — Z1231 Encounter for screening mammogram for malignant neoplasm of breast: Secondary | ICD-10-CM

## 2022-11-02 NOTE — Progress Notes (Unsigned)
    Ellen [name] D.[facility] [address] [place] [address] Phone: [phone]   Assessment and Plan:     There are no diagnoses linked to this encounter.  ***   Pertinent previous records reviewed include ***   Follow Up: ***     Subjective:   I, Ellen [name], am serving as a Neurosurgeon for Doctor Richardean Sale   Chief Complaint: left hip pain    HPI:    10/21/2021 Patient is a 82 year old female complaining of left hip pain. 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    11/04/2021 Patient states that she is better , "the damn hamstrings still hurt " its more of an annoying pain but better than when she came and saw [place]   12/30/2021 Patient states that she is the same, still hurting     01/20/2022 Patient states that its better than it was but its still there   02/12/2022 Patient states that she is the same    03/19/2022 Patient states that PT is going into a new phase , still has a little pain not as bad    04/16/2022 Patient states she is better not 100% and she doesn't think it will get there    06/04/2022 Patient states she is about 70% better    07/23/2022 Patient states she is still in pain , says its just going to be there    09/03/2022 Patient states she has pain    10/01/2022 Patient states that she still has flares    11/03/2022 Patient states    Relevant Historical Information: None pertinent  Additional pertinent review of systems negative.   Current Outpatient Medications:    estrogen, conjugated,-medroxyprogesterone (PREMPRO) 0.45-1.5 MG tablet, Take  1 tablet by mouth daily., Disp: 28 tablet, Rfl: 0   hydrochlorothiazide (MICROZIDE) 12.5 MG capsule, TAKE 1 CAPSULE(12.5 MG) BY MOUTH DAILY, Disp: 90 capsule, Rfl: 1   losartan (COZAAR) 50 MG tablet, TAKE 1 TABLET(50 MG) BY MOUTH DAILY, Disp: 90 tablet, Rfl: 1   pantoprazole (PROTONIX) 40 MG tablet, TAKE 1 TABLET(40 MG) BY MOUTH DAILY, Disp: 90 tablet, Rfl: 0   Objective:     There were no vitals filed for this visit.    There is no height or weight on file to calculate BMI.    Physical Exam:    ***   Electronically signed by:  Ellen [name] D.[facility] 10:24 AM 11/02/22

## 2022-11-03 ENCOUNTER — Ambulatory Visit (INDEPENDENT_AMBULATORY_CARE_PROVIDER_SITE_OTHER): Payer: Medicare Other | Admitting: Sports Medicine

## 2022-11-03 VITALS — BP 110/78 | HR 73 | Ht 64.0 in | Wt 116.0 lb

## 2022-11-03 DIAGNOSIS — M1612 Unilateral primary osteoarthritis, left hip: Secondary | ICD-10-CM

## 2022-11-03 DIAGNOSIS — M25552 Pain in left hip: Secondary | ICD-10-CM

## 2022-11-03 DIAGNOSIS — M7062 Trochanteric bursitis, left hip: Secondary | ICD-10-CM | POA: Diagnosis not present

## 2022-11-03 NOTE — Patient Instructions (Signed)
3 week follow up.

## 2022-11-07 ENCOUNTER — Other Ambulatory Visit: Payer: Self-pay | Admitting: Family Medicine

## 2022-11-09 ENCOUNTER — Other Ambulatory Visit: Payer: Self-pay | Admitting: *Deleted

## 2022-11-09 DIAGNOSIS — M7989 Other specified soft tissue disorders: Secondary | ICD-10-CM

## 2022-11-23 ENCOUNTER — Ambulatory Visit (HOSPITAL_COMMUNITY)
Admission: RE | Admit: 2022-11-23 | Discharge: 2022-11-23 | Disposition: A | Discharge status: Home / Self Care | Payer: Medicare Other | Source: Ambulatory Visit | Attending: Surgery | Admitting: Surgery

## 2022-11-23 ENCOUNTER — Ambulatory Visit (INDEPENDENT_AMBULATORY_CARE_PROVIDER_SITE_OTHER): Payer: Medicare Other | Admitting: Physician Assistant

## 2022-11-23 VITALS — BP 164/82 | HR 70 | Temp 97.6°F | Resp 14 | Ht 64.0 in | Wt 113.0 lb

## 2022-11-23 DIAGNOSIS — M7989 Other specified soft tissue disorders: Secondary | ICD-10-CM | POA: Diagnosis not present

## 2022-11-23 DIAGNOSIS — M79652 Pain in left thigh: Secondary | ICD-10-CM | POA: Diagnosis not present

## 2022-11-23 NOTE — Progress Notes (Signed)
Ellen [name] D.[name] [address] [place] [address] Phone: [phone]   Assessment and Plan:     1. Left hip pain 2. Primary osteoarthritis of left hip -Chronic with exacerbation, subsequent visit - Continuance of left hip pain, primarily laterally.  Patient had essentially no change in symptoms after greater trochanteric CSI at previous office visit on 11/03/2022, so this does not appear to be area of patient's primary pain.  Patient does have gluteal tendinosis and moderate femoral acetabular osteoarthritis as seen on MRI, so we will target intra-articular pathology with intra-articular CSI at today's visit.  Tolerated well per note below - Recommend Tylenol for day-to-day pain relief.  May use naproxen as needed for breakthrough pain.  Do not recommend using NSAIDs more frequently than 1-2 doses per week  Procedure: Ultrasound Guided Hip Acetabulofemoral Joint Injection Side: Left Diagnosis: Flare of osteoarthritis [place] Indication:  - accuracy is paramount for diagnosis - to ensure therapeutic efficacy or procedural success - to reduce procedural risk  After explaining the procedure, viable alternatives, risks, and answering any questions, consent was given verbally. The site was cleaned with chlorhexidine prep. An ultrasound transducer was placed on the anterior thigh/hip.   The acetabular joint, labrum, and femoral shaft were identified.  The neurovascular structures were identified and an approach was found specifically avoiding these structures.  A steroid injection was performed under ultrasound guidance with sterile technique using 2ml of 1% lidocaine without epinephrine and 40 mg of triamcinolone (KENALOG) 40mg /ml. This was well tolerated and resulted in  relief.  Needle was removed and dressing placed and post injection instructions were given including  a discussion of likely return of pain today after the anesthetic wears off (with  the possibility of worsened pain) until the steroid starts to work in 1-3 days.   Pt was advised to call or return to clinic if these symptoms worsen or fail to improve as anticipated.    Pertinent previous records reviewed include none   Follow Up: 4 weeks for reevaluation.  If no improvement or worsening of symptoms, could consider luteal muscle/tendon CSI versus referral to orthopedic surgery   Subjective:   I, Ellen [name], am serving as a Neurosurgeon for Doctor Ellen [name]   Chief Complaint: left hip pain    HPI:    10/21/2021 Patient is a 82 year old female complaining of left hip pain. 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    11/04/2021 Patient states that she is better , "the damn hamstrings still hurt " its more of an annoying pain but better than when she came and saw [place]   12/30/2021 Patient states that she is the same, still hurting     01/20/2022 Patient states that its better than it was but its still there   02/12/2022 Patient states that she is the same    03/19/2022 Patient states that PT is going into a new phase , still has a little pain not as bad    04/16/2022 Patient states she is better not 100% and she  doesn't think it will get there    06/04/2022 Patient states she is about 70% better    07/23/2022 Patient states she is still in pain , says its just going to be there    09/03/2022 Patient states she has pain    10/01/2022 Patient states that she still has flares    11/03/2022 Patient states intermittent pain , today the pain is in the hip she cut grass yesterday her garden is fruit full this  year    11/24/2022 Patient states CSI provided no relief  "3 weeks of hell"   Relevant Historical Information: None pertinent  Additional pertinent review of systems negative.   Current Outpatient Medications:    hydrochlorothiazide (MICROZIDE) 12.5 MG capsule, TAKE 1 CAPSULE(12.5 MG) BY MOUTH DAILY, Disp: 90 capsule, Rfl: 1   losartan (COZAAR) 50 MG tablet, TAKE 1 TABLET(50 MG) BY MOUTH DAILY, Disp: 90 tablet, Rfl: 1   pantoprazole (PROTONIX) 40 MG tablet, TAKE 1 TABLET(40 MG) BY MOUTH DAILY, Disp: 90 tablet, Rfl: 0   PREMPRO 0.45-1.5 MG tablet, TAKE 1 TABLET BY MOUTH DAILY, Disp: 28 tablet, Rfl: 0   Objective:     Vitals:   11/24/22 0939  Pulse: 83  SpO2: 97%  Weight: 114 lb (51.7 kg)  Height: 5\' 4"  (1.626 m)      Body mass index is 19.57 kg/m.    Physical Exam:    General: awake, alert, and oriented no acute distress, nontoxic Skin: no suspicious lesions or rashes Neuro:sensation intact distally with no dificits, normal muscle tone, no atrophy, strength 5/5 in all tested lower ext groups Psych: normal mood and affect, speech clear   Left hip: No deformity, swelling or wasting ROM Flexion 90, ext 20, IR 30, ER 35 TTP moderate greater trochanter, moderate IT band, mild gluteal musculature, mild hip flexors NTTP   si joint, lumbar spine Negative log roll with FROM Negative FABER Positive FADIR for external hip pain negative piriformis test   Gait normal     Electronically signed by:  Ellen [name] D.[name] 10:10 AM 11/24/22

## 2022-11-23 NOTE — Progress Notes (Unsigned)
Office Note     CC:  follow up Requesting Provider:  Richardean Sale, DO  HPI: Ellen [name] is a 82 y.o. (07-10-1940) female who presents for evaluation of edema and hyperpigmentation of bilateral lower extremities.  She denies any history of DVT, venous ulcerations, trauma, or prior vascular intervention.  She has been treated by her PCP for bursitis in the left thigh which has been affecting her day-to-day life.  She has been unable to enjoy gardening due to the level of pain.  She does not complain of any significant edema of bilateral lower extremities.  She wears knee-high compression in the winter months however is unable to tolerate them in the summertime.  She elevates her legs when resting during the day.  She denies tobacco use.    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   Left Buccal Cheek (Keratoacanthoma)   Umbilical hernia     Past Surgical History:  Procedure Laterality Date   BOWEL RESECTION  07/09/2013   Procedure: SMALL BOWEL RESECTION;  Surgeon: Velora Heckler, MD;  Location: [facility];  Service: General;;   LAPAROTOMY N/A 07/09/2013   Procedure: EXPLORATORY LAPAROTOMY;  Surgeon: Velora Heckler, MD;  Location: [facility];  Service: General;  Laterality: N/A;   SHOULDER ARTHROSCOPY WITH CAPSULORRHAPHY      Social History   Socioeconomic History   Marital status: Married    Spouse name: Not on file   Number of children: 0   Years of education: Not on file   Highest education level: Not on file  Occupational History   Occupation: Retired   Tobacco Use   Smoking status: Former    Types: Cigarettes   Smokeless tobacco: Never  Vaping Use   Vaping Use: Never used  Substance and Sexual Activity   Alcohol use: Yes    Alcohol/week: 7.0 standard drinks of alcohol    Types:  7 Glasses of wine per week   Drug use: No   Sexual activity: Not Currently  Other Topics Concern   Not on file  Social History Narrative   Enjoys gardening    Social Determinants of Health   Financial Resource Strain: Low Risk  (07/16/2022)   Overall Financial Resource Strain (CARDIA)    Difficulty of Paying Living Expenses: Not hard at all  Food Insecurity: No Food Insecurity (07/16/2022)   Hunger Vital Sign    Worried About Running Out of Food in the Last Year: Never true    Ran Out of Food in the Last Year: Never true  Transportation Needs: No Transportation Needs (07/16/2022)   PRAPARE - Administrator, Civil Service (Medical): No    Lack of Transportation (Non-Medical): No  Physical Activity: Sufficiently Active (07/16/2022)   Exercise Vital Sign    Days of Exercise per Week: 5 days    Minutes of Exercise per Session: 120 min  Stress: No Stress Concern Present (07/16/2022)   [employer] of Occupational Health - Occupational Stress Questionnaire    Feeling of Stress : Not at all  Social Connections: Moderately Integrated (07/16/2022)   Social Connection and Isolation Panel [NHANES]    Frequency of Communication with Friends and Family: More than three times a week  Frequency of Social Gatherings with Friends and Family: More than three times a week    Attends Religious Services: More than 4 times per year    Active Member of [employer] or Organizations: No    Attends Banker Meetings: Never    Marital Status: Married  Catering manager Violence: Not At Risk (07/16/2022)   Humiliation, Afraid, Rape, and Kick questionnaire    Fear of Current or Ex-Partner: No    Emotionally Abused: No    Physically Abused: No    Sexually Abused: No    Family History  Problem Relation Age of Onset   Diabetes Mother    Heart attack Mother    CAD Father    Heart attack Father    Breast cancer Neg Hx    Colon cancer Neg Hx    Esophageal cancer Neg Hx    Rectal  cancer Neg Hx    Stomach cancer Neg Hx     Current Outpatient Medications  Medication Sig Dispense Refill   hydrochlorothiazide (MICROZIDE) 12.5 MG capsule TAKE 1 CAPSULE(12.5 MG) BY MOUTH DAILY 90 capsule 1   losartan (COZAAR) 50 MG tablet TAKE 1 TABLET(50 MG) BY MOUTH DAILY 90 tablet 1   pantoprazole (PROTONIX) 40 MG tablet TAKE 1 TABLET(40 MG) BY MOUTH DAILY 90 tablet 0   PREMPRO 0.45-1.5 MG tablet TAKE 1 TABLET BY MOUTH DAILY 28 tablet 0   No current facility-administered medications for this visit.    Allergies  Allergen Reactions   Lactose Intolerance (Gi) Diarrhea   Sulfa Antibiotics Nausea And Vomiting and Swelling     REVIEW OF SYSTEMS:   [X]  denotes positive finding, [ ]  denotes negative finding Cardiac  Comments:  Chest pain or chest pressure:    Shortness of breath upon exertion:    Short of breath when lying flat:    Irregular heart rhythm:        Vascular    Pain in calf, thigh, or hip brought on by ambulation:    Pain in feet at night that wakes you up from your sleep:     Blood clot in your veins:    Leg swelling:         Pulmonary    Oxygen at home:    Productive cough:     Wheezing:         Neurologic    Sudden weakness in arms or legs:     Sudden numbness in arms or legs:     Sudden onset of difficulty speaking or slurred speech:    Temporary loss of vision in one eye:     Problems with dizziness:         Gastrointestinal    Blood in stool:     Vomited blood:         Genitourinary    Burning when urinating:     Blood in urine:        Psychiatric    Major depression:         Hematologic    Bleeding problems:    Problems with blood clotting too easily:        Skin    Rashes or ulcers:        Constitutional    Fever or chills:      PHYSICAL EXAMINATION:  Vitals:   11/23/22 1333  BP: (!) 164/82  Pulse: 70  Resp: 14  Temp: 97.6 F (36.4 C)  TempSrc: Temporal  SpO2: 97%  Weight: 113 lb (51.3  kg)  Height: 5\' 4"  (1.626 m)     General:  WDWN in NAD; vital signs documented above Gait: Not observed HENT: WNL, normocephalic Pulmonary: normal non-labored breathing , without Rales, rhonchi,  wheezing Cardiac: regular HR Abdomen: soft, NT, no masses Skin: without rashes Vascular Exam/Pulses: palpable DP pulses Extremities: without ischemic changes, without Gangrene , without cellulitis; without open wounds; hyperpigmentation of both shins but no significant edema noted; right ankle appears more swollen than the left Musculoskeletal: no muscle wasting or atrophy  Neurologic: A&O X 3 Psychiatric:  The pt has Normal affect.   Non-Invasive Vascular Imaging:   Left lower extremity venous reflux study negative for DVT Mild superficial reflux in the saphenofemoral junction however GSV was not well-visualized throughout the thigh    ASSESSMENT/PLAN:: 82 y.o. female here for evaluation of bilateral lower extremity edema, hyperpigmentation, and left thigh pain  -Left lower extremity venous reflux study was performed and was negative for DVT.  She only has mild superficial venous reflux in the saphenofemoral junction.  This would not contribute to her left thigh pain based on her history.  As for her lower legs, she is not bothered by the hyperpigmentation or edema.  She wears compression in the winter months however has no interest in wearing them in the summertime.  I recommended that she elevate her legs above the level of her heart periodically throughout the day.  She should also avoid prolonged sitting and standing.  Nothing to add from a vascular surgery standpoint.  She will follow-up with her PCP regarding left thigh pain   Emilie Rutter, PA-C [facility] [phone]  Clinic MD:   Myra Gianotti

## 2022-11-24 ENCOUNTER — Ambulatory Visit (INDEPENDENT_AMBULATORY_CARE_PROVIDER_SITE_OTHER): Payer: Medicare Other | Admitting: Sports Medicine

## 2022-11-24 ENCOUNTER — Other Ambulatory Visit: Payer: Self-pay

## 2022-11-24 VITALS — HR 83 | Ht 64.0 in | Wt 114.0 lb

## 2022-11-24 DIAGNOSIS — M25552 Pain in left hip: Secondary | ICD-10-CM | POA: Diagnosis not present

## 2022-11-24 DIAGNOSIS — M1612 Unilateral primary osteoarthritis, left hip: Secondary | ICD-10-CM

## 2022-11-24 NOTE — Patient Instructions (Signed)
4 week follow up

## 2022-11-25 ENCOUNTER — Encounter: Payer: Self-pay | Admitting: Physician Assistant

## 2022-12-15 ENCOUNTER — Telehealth: Payer: Self-pay | Admitting: Internal Medicine

## 2022-12-15 NOTE — Telephone Encounter (Signed)
Inbound call from patient requesting refill for doxycycline. Preferred pharmacy is [facility] on [address]. Please advise.   Thank you

## 2022-12-17 ENCOUNTER — Other Ambulatory Visit: Payer: Self-pay | Admitting: Gastroenterology

## 2022-12-21 NOTE — Progress Notes (Unsigned)
    Ellen [name] D.[facility] [address] [place] [address] Phone: [phone]   Assessment and Plan:     There are no diagnoses linked to this encounter.  ***   Pertinent previous records reviewed include ***   Follow Up: ***     Subjective:   I, Ellen [name], am serving as a Neurosurgeon for Doctor Richardean Sale   Chief Complaint: left hip pain    HPI:    10/21/2021 Patient is a 82 year old female complaining of left hip pain. 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    11/04/2021 Patient states that she is better , "the damn hamstrings still hurt " its more of an annoying pain but better than when she came and saw [place]   12/30/2021 Patient states that she is the same, still hurting     01/20/2022 Patient states that its better than it was but its still there   02/12/2022 Patient states that she is the same    03/19/2022 Patient states that PT is going into a new phase , still has a little pain not as bad    04/16/2022 Patient states she is better not 100% and she doesn't think it will get there    06/04/2022 Patient states she is about 70% better    07/23/2022 Patient states she is still in pain , says its just going to be there    09/03/2022 Patient states she has pain    10/01/2022 Patient states that she still has flares    11/03/2022 Patient states intermittent pain , today the pain is in the hip she cut grass yesterday her garden is fruit full this year    11/24/2022 Patient states CSI provided no relief  "3 weeks of hell"  12/22/2022 Patient states    Relevant  Historical Information: None pertinent  Additional pertinent review of systems negative.   Current Outpatient Medications:    hydrochlorothiazide (MICROZIDE) 12.5 MG capsule, TAKE 1 CAPSULE(12.5 MG) BY MOUTH DAILY, Disp: 90 capsule, Rfl: 1   losartan (COZAAR) 50 MG tablet, TAKE 1 TABLET(50 MG) BY MOUTH DAILY, Disp: 90 tablet, Rfl: 1   pantoprazole (PROTONIX) 40 MG tablet, TAKE 1 TABLET(40 MG) BY MOUTH DAILY, Disp: 90 tablet, Rfl: 0   PREMPRO 0.45-1.5 MG tablet, TAKE 1 TABLET BY MOUTH DAILY, Disp: 28 tablet, Rfl: 0   Objective:     There were no vitals filed for this visit.    There is no height or weight on file to calculate BMI.    Physical Exam:    ***   Electronically signed by:  Ellen [name] D.[facility] 10:58 AM 12/21/22

## 2022-12-22 ENCOUNTER — Other Ambulatory Visit: Payer: Self-pay

## 2022-12-22 ENCOUNTER — Ambulatory Visit: Payer: Medicare Other | Admitting: Sports Medicine

## 2022-12-22 VITALS — HR 106 | Ht 64.0 in | Wt 114.0 lb

## 2022-12-22 DIAGNOSIS — M1612 Unilateral primary osteoarthritis, left hip: Secondary | ICD-10-CM | POA: Diagnosis not present

## 2022-12-22 DIAGNOSIS — M25552 Pain in left hip: Secondary | ICD-10-CM | POA: Diagnosis not present

## 2022-12-22 NOTE — Patient Instructions (Signed)
Ortho referral  4 week follow up

## 2022-12-24 ENCOUNTER — Inpatient Hospital Stay (HOSPITAL_COMMUNITY)
Admission: EM | Admit: 2022-12-24 | Discharge: 2022-12-28 | DRG: 390 | Disposition: A | Discharge status: Home / Self Care | Payer: Medicare Other | Attending: Internal Medicine | Admitting: Internal Medicine

## 2022-12-24 ENCOUNTER — Other Ambulatory Visit: Payer: Self-pay

## 2022-12-24 ENCOUNTER — Emergency Department (HOSPITAL_COMMUNITY): Payer: Medicare Other

## 2022-12-24 ENCOUNTER — Encounter (HOSPITAL_COMMUNITY): Payer: Self-pay | Admitting: *Deleted

## 2022-12-24 DIAGNOSIS — E739 Lactose intolerance, unspecified: Secondary | ICD-10-CM | POA: Diagnosis present

## 2022-12-24 DIAGNOSIS — R932 Abnormal findings on diagnostic imaging of liver and biliary tract: Secondary | ICD-10-CM | POA: Diagnosis not present

## 2022-12-24 DIAGNOSIS — K219 Gastro-esophageal reflux disease without esophagitis: Secondary | ICD-10-CM | POA: Diagnosis present

## 2022-12-24 DIAGNOSIS — K5750 Diverticulosis of both small and large intestine without perforation or abscess without bleeding: Secondary | ICD-10-CM | POA: Diagnosis not present

## 2022-12-24 DIAGNOSIS — Z882 Allergy status to sulfonamides status: Secondary | ICD-10-CM

## 2022-12-24 DIAGNOSIS — R600 Localized edema: Secondary | ICD-10-CM | POA: Diagnosis present

## 2022-12-24 DIAGNOSIS — K5900 Constipation, unspecified: Secondary | ICD-10-CM | POA: Diagnosis present

## 2022-12-24 DIAGNOSIS — K56600 Partial intestinal obstruction, unspecified as to cause: Secondary | ICD-10-CM | POA: Diagnosis present

## 2022-12-24 DIAGNOSIS — K56609 Unspecified intestinal obstruction, unspecified as to partial versus complete obstruction: Secondary | ICD-10-CM | POA: Diagnosis not present

## 2022-12-24 DIAGNOSIS — K5651 Intestinal adhesions [bands], with partial obstruction: Secondary | ICD-10-CM | POA: Diagnosis present

## 2022-12-24 DIAGNOSIS — D751 Secondary polycythemia: Secondary | ICD-10-CM | POA: Diagnosis present

## 2022-12-24 DIAGNOSIS — Z79899 Other long term (current) drug therapy: Secondary | ICD-10-CM | POA: Diagnosis not present

## 2022-12-24 DIAGNOSIS — R112 Nausea with vomiting, unspecified: Secondary | ICD-10-CM | POA: Diagnosis not present

## 2022-12-24 DIAGNOSIS — K7689 Other specified diseases of liver: Secondary | ICD-10-CM | POA: Diagnosis not present

## 2022-12-24 DIAGNOSIS — R1084 Generalized abdominal pain: Secondary | ICD-10-CM | POA: Diagnosis not present

## 2022-12-24 DIAGNOSIS — Z833 Family history of diabetes mellitus: Secondary | ICD-10-CM | POA: Diagnosis not present

## 2022-12-24 DIAGNOSIS — E860 Dehydration: Secondary | ICD-10-CM | POA: Diagnosis present

## 2022-12-24 DIAGNOSIS — E876 Hypokalemia: Secondary | ICD-10-CM | POA: Diagnosis not present

## 2022-12-24 DIAGNOSIS — R739 Hyperglycemia, unspecified: Secondary | ICD-10-CM | POA: Diagnosis present

## 2022-12-24 DIAGNOSIS — Z87891 Personal history of nicotine dependence: Secondary | ICD-10-CM

## 2022-12-24 DIAGNOSIS — K5909 Other constipation: Secondary | ICD-10-CM | POA: Diagnosis not present

## 2022-12-24 DIAGNOSIS — E869 Volume depletion, unspecified: Secondary | ICD-10-CM | POA: Diagnosis present

## 2022-12-24 DIAGNOSIS — K529 Noninfective gastroenteritis and colitis, unspecified: Secondary | ICD-10-CM | POA: Diagnosis not present

## 2022-12-24 DIAGNOSIS — F419 Anxiety disorder, unspecified: Secondary | ICD-10-CM | POA: Diagnosis present

## 2022-12-24 DIAGNOSIS — R1310 Dysphagia, unspecified: Secondary | ICD-10-CM | POA: Diagnosis present

## 2022-12-24 DIAGNOSIS — R109 Unspecified abdominal pain: Secondary | ICD-10-CM | POA: Diagnosis not present

## 2022-12-24 DIAGNOSIS — R935 Abnormal findings on diagnostic imaging of other abdominal regions, including retroperitoneum: Secondary | ICD-10-CM | POA: Diagnosis not present

## 2022-12-24 DIAGNOSIS — Z8249 Family history of ischemic heart disease and other diseases of the circulatory system: Secondary | ICD-10-CM

## 2022-12-24 DIAGNOSIS — K5669 Other partial intestinal obstruction: Secondary | ICD-10-CM | POA: Diagnosis not present

## 2022-12-24 DIAGNOSIS — Z85828 Personal history of other malignant neoplasm of skin: Secondary | ICD-10-CM

## 2022-12-24 DIAGNOSIS — I10 Essential (primary) hypertension: Secondary | ICD-10-CM | POA: Diagnosis not present

## 2022-12-24 DIAGNOSIS — Z931 Gastrostomy status: Secondary | ICD-10-CM | POA: Diagnosis not present

## 2022-12-24 LAB — COMPREHENSIVE METABOLIC PANEL
ALT: 20 U/L (ref 0–44)
AST: 19 U/L (ref 15–41)
Albumin: 3.6 g/dL (ref 3.5–5.0)
Alkaline Phosphatase: 80 U/L (ref 38–126)
Anion gap: 10 (ref 5–15)
BUN: 26 mg/dL — ABNORMAL HIGH (ref 8–23)
CO2: 25 mmol/L (ref 22–32)
Calcium: 9.6 mg/dL (ref 8.9–10.3)
Chloride: 102 mmol/L (ref 98–111)
Creatinine, Ser: 0.67 mg/dL (ref 0.44–1.00)
GFR, Estimated: >60 mL/min (ref >60)
Glucose, Bld: 131 mg/dL — ABNORMAL HIGH (ref 70–99)
Potassium: 3.8 mmol/L (ref 3.5–5.1)
Sodium: 137 mmol/L (ref 135–145)
Total Bilirubin: 0.8 mg/dL (ref 0.3–1.2)
Total Protein: 6.6 g/dL (ref 6.5–8.1)

## 2022-12-24 LAB — CBC WITH DIFFERENTIAL/PLATELET
Abs Immature Granulocytes: 0.24 10*3/uL — ABNORMAL HIGH (ref 0.00–0.07)
Basophils Absolute: 0.1 10*3/uL (ref 0.0–0.1)
Basophils Relative: 0 %
Eosinophils Absolute: 0 10*3/uL (ref 0.0–0.5)
Eosinophils Relative: 0 %
HCT: 47.6 % — ABNORMAL HIGH (ref 36.0–46.0)
Hemoglobin: 16.1 g/dL — ABNORMAL HIGH (ref 12.0–15.0)
Immature Granulocytes: 1 %
Lymphocytes Relative: 5 %
Lymphs Abs: 1.1 10*3/uL (ref 0.7–4.0)
MCH: 33.3 pg (ref 26.0–34.0)
MCHC: 33.8 g/dL (ref 30.0–36.0)
MCV: 98.3 fL (ref 80.0–100.0)
Monocytes Absolute: 1.2 10*3/uL — ABNORMAL HIGH (ref 0.1–1.0)
Monocytes Relative: 5 %
Neutro Abs: 20.3 10*3/uL — ABNORMAL HIGH (ref 1.7–7.7)
Neutrophils Relative %: 89 %
Platelets: 308 10*3/uL (ref 150–400)
RBC: 4.84 MIL/uL (ref 3.87–5.11)
RDW: 12.4 % (ref 11.5–15.5)
WBC: 22.9 10*3/uL — ABNORMAL HIGH (ref 4.0–10.5)
nRBC: 0 % (ref 0.0–0.2)

## 2022-12-24 LAB — URINALYSIS, ROUTINE W REFLEX MICROSCOPIC
Bacteria, UA: NONE SEEN
Bilirubin Urine: NEGATIVE
Glucose, UA: NEGATIVE mg/dL
Ketones, ur: NEGATIVE mg/dL
Leukocytes,Ua: NEGATIVE
Nitrite: NEGATIVE
Protein, ur: NEGATIVE mg/dL
Specific Gravity, Urine: >1.046 — ABNORMAL HIGH (ref 1.005–1.030)
pH: 5 (ref 5.0–8.0)

## 2022-12-24 LAB — LIPASE, BLOOD: Lipase: 28 U/L (ref 11–51)

## 2022-12-24 LAB — LACTIC ACID, PLASMA: Lactic Acid, Venous: 1 mmol/L (ref 0.5–1.9)

## 2022-12-24 LAB — MAGNESIUM: Magnesium: 2.3 mg/dL (ref 1.7–2.4)

## 2022-12-24 LAB — PHOSPHORUS: Phosphorus: 4.2 mg/dL (ref 2.5–4.6)

## 2022-12-24 MED ORDER — HYDROMORPHONE HCL 1 MG/ML IJ SOLN
0.5000 mg | INTRAMUSCULAR | Status: DC | PRN
  Administered 2022-12-25 – 2022-12-26 (×3): 0.5 mg via INTRAVENOUS
  Filled 2022-12-24 (×3): qty 0.5

## 2022-12-24 MED ORDER — ONDANSETRON HCL 4 MG/2ML IJ SOLN
4.0000 mg | Freq: Four times a day (QID) | INTRAMUSCULAR | Status: DC | PRN
  Administered 2022-12-24 – 2022-12-25 (×4): 4 mg via INTRAVENOUS
  Filled 2022-12-24 (×4): qty 2

## 2022-12-24 MED ORDER — ONDANSETRON HCL 4 MG PO TABS: 4.0000 mg | ORAL_TABLET | Freq: Four times a day (QID) | ORAL | Status: DC | PRN

## 2022-12-24 MED ORDER — PIPERACILLIN-TAZOBACTAM 3.375 G IVPB 30 MIN
3.3750 g | Freq: Once | INTRAVENOUS | Status: AC
  Administered 2022-12-24: 3.375 g via INTRAVENOUS
  Filled 2022-12-24: qty 50

## 2022-12-24 MED ORDER — LABETALOL HCL 5 MG/ML IV SOLN
10.0000 mg | INTRAVENOUS | Status: DC | PRN
  Administered 2022-12-24 – 2022-12-25 (×5): 10 mg via INTRAVENOUS
  Filled 2022-12-24 (×8): qty 4

## 2022-12-24 MED ORDER — ENOXAPARIN SODIUM 40 MG/0.4ML IJ SOSY
40.0000 mg | PREFILLED_SYRINGE | INTRAMUSCULAR | Status: DC
  Administered 2022-12-25 – 2022-12-27 (×3): 40 mg via SUBCUTANEOUS
  Filled 2022-12-24 (×3): qty 0.4

## 2022-12-24 MED ORDER — SODIUM CHLORIDE 0.9 % IV BOLUS
500.0000 mL | Freq: Once | INTRAVENOUS | Status: AC
  Administered 2022-12-24: 500 mL via INTRAVENOUS

## 2022-12-24 MED ORDER — HYDRALAZINE HCL 20 MG/ML IJ SOLN: 5.0000 mg | Freq: Once | INTRAMUSCULAR | Status: DC

## 2022-12-24 MED ORDER — ONDANSETRON HCL 4 MG/2ML IJ SOLN
4.0000 mg | Freq: Once | INTRAMUSCULAR | Status: AC
  Administered 2022-12-24: 4 mg via INTRAVENOUS
  Filled 2022-12-24: qty 2

## 2022-12-24 MED ORDER — LACTATED RINGERS IV BOLUS
500.0000 mL | Freq: Once | INTRAVENOUS | Status: AC
  Administered 2022-12-24: 500 mL via INTRAVENOUS

## 2022-12-24 MED ORDER — PANTOPRAZOLE SODIUM 40 MG IV SOLR
40.0000 mg | INTRAVENOUS | Status: DC
  Administered 2022-12-24 – 2022-12-26 (×3): 40 mg via INTRAVENOUS
  Filled 2022-12-24 (×4): qty 10

## 2022-12-24 MED ORDER — ACETAMINOPHEN 650 MG RE SUPP: 650.0000 mg | Freq: Four times a day (QID) | RECTAL | Status: DC | PRN

## 2022-12-24 MED ORDER — HYDROMORPHONE HCL 1 MG/ML IJ SOLN
0.2500 mg | Freq: Once | INTRAMUSCULAR | Status: AC
  Administered 2022-12-24: 0.25 mg via INTRAVENOUS
  Filled 2022-12-24: qty 1

## 2022-12-24 MED ORDER — PIPERACILLIN-TAZOBACTAM 3.375 G IVPB
3.3750 g | Freq: Three times a day (TID) | INTRAVENOUS | Status: DC
  Administered 2022-12-24 – 2022-12-27 (×8): 3.375 g via INTRAVENOUS
  Filled 2022-12-24 (×8): qty 50

## 2022-12-24 MED ORDER — POTASSIUM CHLORIDE IN NACL 20-0.9 MEQ/L-% IV SOLN
INTRAVENOUS | Status: DC
  Filled 2022-12-24: qty 1000

## 2022-12-24 MED ORDER — PIPERACILLIN-TAZOBACTAM 3.375 G IVPB 30 MIN
3.3750 g | Freq: Three times a day (TID) | INTRAVENOUS | Status: DC
Start: 2022-12-24 — End: 2022-12-24

## 2022-12-24 MED ORDER — ACETAMINOPHEN 325 MG PO TABS: 650.0000 mg | ORAL_TABLET | Freq: Four times a day (QID) | ORAL | Status: DC | PRN

## 2022-12-24 MED ORDER — IOHEXOL 300 MG/ML  SOLN
100.0000 mL | Freq: Once | INTRAMUSCULAR | Status: AC | PRN
  Administered 2022-12-24: 100 mL via INTRAVENOUS

## 2022-12-24 NOTE — Progress Notes (Signed)
Patient is refusing NGT placement tonight.  Wants to wait till tomorrow.  C/o a little nausea but no vomiting.  But asking for a couple of ice chips.  Notified NP on nights about patients refusal of NGT till tomorrow. OK'd till tomorrow observation.

## 2022-12-24 NOTE — H&P (Signed)
History and Physical    Patient: Ellen [name] OZD:[id number] DOB: Jul 21, 1940 DOA: 12/24/2022 DOS: the patient was seen and examined on 12/24/2022 PCP: Ardith Dark, MD  Patient coming from: Home  Chief Complaint:  Chief Complaint  Patient presents with   Constipation   HPI: Ellen [name] is a 82 y.o. female with medical history significant of Seasonal allergies, anxiety, osteoarthritis, left hip bursitis, GERD, hypertension, nephrolithiasis, unspecified dermatitis, left buccal cheek keratoacanthoma, squamous skin cell CA, chronic RLE edema, diverticulosis, diverticulitis, history of NSAID use, ventral hernia, history of SBO who came into the emergency department due to abdominal pain, fatigue, feeling dehydrated, persistent nausea with vomiting x 1 this morning, decreased oral intake and constipation for the past 3 days.  She rarely gets constipated.  No diarrhea, melena or hematochezia.  No flank pain, dysuria, frequency or hematuria.She denied fever, chills, rhinorrhea, sore throat, wheezing or hemoptysis.  No chest pain, palpitations, diaphoresis, PND, orthopnea, but has chronic RLE pitting edema.    No polyuria, polydipsia, polyphagia or blurred vision.   Lab work: CBC showed a white count of 22.9, hemoglobin 16.1 g/dL platelets 259.  Normal lactic acid, magnesium, phosphorus and lipase.  CMP showed a glucose of 131 and BUN of 26 mg/dL.  The rest of the CMP measurements were unremarkable.  Imaging: CT abdomen/pelvis with contrast showed mildly dilated fluid-filled loops of small bowel diffusely with transition at the level of the distal ileum where there is some focal thickening and stranding.  There is trace free fluid and mesenteric stranding.  No free air.  Colonic diverticulosis.  Mild dilatation of the biliary tree but normal tapering of the CBD towards the pancreatic head.  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   ED course: Initial vital signs  were temperature 97.5 F, pulse 94, respirations 17, BP 280/104 mmHg O2 sat 97% on room air.  The patient received hydromorphone 0.25 mg IVP, LR 500 mL bolus, NS 500 mm bolus, ondansetron 4 mg IVP and first dose of Zosyn 3.375 g.  Review of Systems: As mentioned in the history of present illness. All other systems reviewed and are negative. 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 Her smoking use included cigarettes. She has never used smokeless tobacco. She reports current alcohol use of about 7.0 standard drinks of alcohol per week. She reports that she does not use drugs.  Allergies  Allergen Reactions   Lactose Intolerance (Gi) Diarrhea   Sulfa Antibiotics Nausea And Vomiting and Swelling    Family History  Problem Relation Age of Onset   Diabetes Mother    Heart attack Mother    CAD Father    Heart attack Father  Breast cancer Neg Hx    Colon cancer Neg Hx    Esophageal cancer Neg Hx    Rectal cancer Neg Hx    Stomach cancer Neg Hx     Prior to Admission medications   Medication Sig Start Date End Date Taking? Authorizing Provider  acetaminophen (TYLENOL) 325 MG tablet Take 650 mg by mouth as needed for moderate pain.   Yes [provider]  Calcium Carbonate Antacid (TUMS PO) Take 1  tablet by mouth as needed (reflux).   Yes [provider]  Carboxymethylcellulose Sodium (EYE DROPS OP) Place 2 drops into both eyes daily. Thera Tears   Yes [provider]  hydrochlorothiazide (MICROZIDE) 12.5 MG capsule TAKE 1 CAPSULE(12.5 MG) BY MOUTH DAILY 06/29/22  Yes Ardith Dark, MD  ibuprofen (ADVIL) 200 MG tablet Take 200 mg by mouth as needed for moderate pain.   Yes [provider]  losartan (COZAAR) 50 MG tablet TAKE 1 TABLET(50 MG) BY MOUTH DAILY 06/29/22  Yes Ardith Dark, MD  pantoprazole (PROTONIX) 40 MG tablet TAKE 1 TABLET(40 MG) BY MOUTH DAILY Patient taking differently: Take 40 mg by mouth daily as needed (reflux). 10/07/22  Yes Imogene Burn, MD  PREMPRO 0.45-1.5 MG tablet TAKE 1 TABLET BY MOUTH DAILY Patient taking differently: Take 1 tablet by mouth every 3 (three) days. 11/09/22  Yes Ardith Dark, MD    Physical Exam: Vitals:   12/24/22 1200 12/24/22 1215 12/24/22 1245 12/24/22 1300  BP: (!) 165/85 (!) 165/88    Pulse: 83 83 89 90  Resp:  17    Temp:  97.9 F (36.6 C)    TempSrc:  Oral    SpO2: 91% 94% 97% 92%  Weight:      Height:       Physical Exam Vitals and nursing note reviewed.  Constitutional:      General: She is awake. She is not in acute distress.    Appearance: Normal appearance. She is not ill-appearing.  HENT:     Head: Normocephalic.     Nose: No rhinorrhea.     Mouth/Throat:     Mouth: Mucous membranes are dry.  Eyes:     General: No scleral icterus.    Pupils: Pupils are equal, round, and reactive to light.  Neck:     Vascular: No JVD.  Cardiovascular:     Rate and Rhythm: Normal rate and regular rhythm.     Heart sounds: S1 normal and S2 normal.     Comments: (Right leg has chronic edema for years).  No edema today, but there is significant skin folding when compared to LLE. Pulmonary:     Effort: Pulmonary effort is normal.     Breath sounds: Normal breath sounds. No wheezing, rhonchi or rales.   Abdominal:     General: Bowel sounds are increased. There is distension.     Palpations: Abdomen is soft.     Tenderness: There is abdominal tenderness in the right lower quadrant and left lower quadrant. There is no right CVA tenderness, left CVA tenderness, guarding or rebound.  Musculoskeletal:     Cervical back: Neck supple.     Right lower leg: No edema.     Left lower leg: No edema.  Skin:    General: Skin is warm and dry.  Neurological:     General: No focal deficit present.     Mental Status: She is alert and oriented to person, place, and time.  Psychiatric:  Mood and Affect: Mood normal.        Behavior: Behavior normal. Behavior is cooperative.     Data Reviewed:  Results are pending, will review when available.  Assessment and Plan: Principal Problem:   Constipation In the setting of:   Partial small bowel obstruction (HCC) Inpatient/MedSurg. Keep NPO. NGT deferred for now. Continue IV fluids. Analgesics as needed. Antiemetics as needed. Given leukocytosis will continue Zosyn. Pantoprazole 40 mg IVP every 24 hours. Keep electrolytes optimized. Follow-up CBC and CMP in AM. Follow-up imaging in the morning. General surgery input appreciated.  Active Problems:   Volume depletion Continue IV fluids.    Hypertension Labetalol IVP as needed while NPO.    Hyperglycemia Hemoglobin A1c 5.5% last year. Will recheck hemoglobin A1c in AM.    Chronic right leg edema No edema today.    Gastroesophageal reflux disease On pantoprazole 40 mg IVP daily.    Polycythemia Secondary to hemoconcentration. Continue IV fluids. Follow-up H&H in the morning.    Advance Care Planning:   Code Status: Full Code   Consults: [facility].  Family Communication:   Severity of Illness: The appropriate patient status for this patient is OBSERVATION. Observation status is judged to be reasonable and necessary in order to provide the required  intensity of service to ensure the patient's safety. The patient's presenting symptoms, physical exam findings, and initial radiographic and laboratory data in the context of their medical condition is felt to place them at decreased risk for further clinical deterioration. Furthermore, it is anticipated that the patient will be medically stable for discharge from the hospital within 2 midnights of admission.   Author: Bobette Mo, MD 12/24/2022 2:17 PM  For on call review [email or web address].   This document was prepared using Dragon voice recognition software and may contain some unintended transcription errors.

## 2022-12-24 NOTE — Consult Note (Signed)
Valri Steidley [facility] Jun 23, 1940  [id number].    Requesting MD: Dr. 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  Patient reports 3 days ago she began having generalized abdominal pain that is worst in her LLQ with associated abdominal bloating/distension and constipation. No flatus or BM since onset. She normally has a BM every morning. No recent diarrhea. Denies melena or hematochezia. She has had a poor appetite with worsening nausea after po intake. Vomiting x 1 this morning. No associated fevers. Reports hx of similar symptoms in April and was tx w/ Doxycycline by GI for possible SIBO with resolution of her symptoms.   She presented to the ED secondary to this pain.  She was found to have a small bowel obstruction secondary to wall thickening and inflammatory changes of the distal small bowel, which could represent infectious or ischemic enteritis.  Her WBC is 22.9K, but her hgb is 16 as well, suspicious for dehydration. We have been asked to see for further evaluation and recommendations. Since receiving Zofran her nausea has resolved.   She is not on blood thinners. No currently tobacco use. Drinks 2 glasses of wine per day. No drug use. Retired. Lives at home with her husband. Walks independently at baseline. She has never had a colonoscopy.   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 Her smoking use included cigarettes. She has never used smokeless tobacco. She reports current alcohol use of about 7.0 standard drinks of alcohol per week. She reports that she does not use drugs.  Allergies:  Allergies  Allergen Reactions   Lactose Intolerance (Gi) Diarrhea   Sulfa Antibiotics Nausea And Vomiting and Swelling    (Not in a hospital admission)    Physical Exam: Blood pressure (!) 165/88, pulse 90, temperature 97.9 F (36.6 C), temperature source Oral, resp. rate 17, height 5\' 4"  (1.626 m), weight 51.7 kg, SpO2 92%.  General: pleasant, WD, WN  female who is laying in bed in NAD HEENT: head is normocephalic,  atraumatic. Heart: regular, rate, and rhythm Lungs: CTAB, no wheezes, rhonchi, or rales noted.  Respiratory effort nonlabored Abd: Distended but very soft. Generalized abdominal ttp that is greatest in the LLQ without rigidity or guarding. +BS.  Skin: warm and dry Psych: A&Ox3 with an appropriate affect.  Results for orders placed or performed during the hospital encounter of 12/24/22 (from the past 48 hour(s))  Comprehensive metabolic panel     Status: Abnormal   Collection Time: 12/24/22 11:19 AM  Result Value Ref Range   Sodium 137 135 - 145 mmol/L   Potassium 3.8 3.5 - 5.1 mmol/L   Chloride 102 98 - 111 mmol/L   CO2 25 22 -  32 mmol/L   Glucose, Bld 131 (H) 70 - 99 mg/dL    Comment: Glucose reference range applies only to samples taken after fasting for at least 8 hours.   BUN 26 (H) 8 - 23 mg/dL   Creatinine, Ser 1.61 0.44 - 1.00 mg/dL   Calcium 9.6 8.9 - 09.6 mg/dL   Total Protein 6.6 6.5 - 8.1 g/dL   Albumin 3.6 3.5 - 5.0 g/dL   AST 19 15 - 41 U/L   ALT 20 0 - 44 U/L   Alkaline Phosphatase 80 38 - 126 U/L   Total Bilirubin 0.8 0.3 - 1.2 mg/dL   GFR, Estimated >04 >54 mL/min    Comment: (NOTE) Calculated using the CKD-EPI Creatinine Equation (2021)    Anion gap 10 5 - 15    Comment: Performed at [facility], [address] [place], [place] [address]  Lipase, blood     Status: None   Collection Time: 12/24/22 11:19 AM  Result Value Ref Range   Lipase 28 11 - 51 U/L    Comment: Performed at [facility], [address] [place], [place] [address]  CBC with Diff     Status: Abnormal   Collection Time: 12/24/22 11:19 AM  Result Value Ref Range   WBC 22.9 (H) 4.0 - 10.5 K/uL   RBC 4.84 3.87 - 5.11 MIL/uL   Hemoglobin 16.1 (H) 12.0 - 15.0 g/dL   HCT 29.5 (H) 62.1 - 30.8 %   MCV 98.3 80.0 - 100.0 fL   MCH 33.3 26.0 - 34.0 pg   MCHC 33.8 30.0 - 36.0 g/dL   RDW 65.7 84.6 - 96.2 %   Platelets 308 150 - 400 K/uL   nRBC 0.0 0.0 - 0.2 %   Neutrophils Relative % 89 %   Neutro Abs 20.3 (H) 1.7 - 7.7 K/uL   Lymphocytes Relative 5 %   Lymphs Abs 1.1 0.7 - 4.0 K/uL   Monocytes Relative 5 %   Monocytes Absolute 1.2 (H) 0.1 - 1.0 K/uL   Eosinophils Relative 0 %   Eosinophils Absolute 0.0 0.0 - 0.5 K/uL   Basophils Relative 0 %   Basophils Absolute 0.1 0.0 - 0.1 K/uL   Immature Granulocytes 1 %   Abs Immature Granulocytes 0.24 (H) 0.00 - 0.07 K/uL    Comment: Performed at [facility], [address], [place], [place] [address]  Magnesium     Status: None   Collection Time: 12/24/22 11:19 AM  Result Value Ref Range   Magnesium 2.3 1.7 - 2.4 mg/dL     Comment: Performed at [facility], [address], [place], [place] [address]  Phosphorus     Status: None   Collection Time: 12/24/22 11:19 AM  Result Value Ref  Range   Phosphorus 4.2 2.5 - 4.6 mg/dL    Comment: Performed at [facility], [address] [place], [place] [address]   CT ABDOMEN PELVIS W CONTRAST  Result Date: 12/24/2022 CLINICAL DATA:  Constipation. Unable to void. Increasing abdominal pain. EXAM: CT ABDOMEN AND PELVIS WITH CONTRAST TECHNIQUE: Multidetector CT imaging of the abdomen and pelvis was performed using the standard protocol following bolus administration of intravenous contrast. RADIATION DOSE REDUCTION: This exam was performed according to the departmental dose-optimization program which includes automated exposure control, adjustment of the mA and/or kV according to patient size and/or use of iterative reconstruction technique. CONTRAST:  OMNIPAQUE IOHEXOL 300 MG/ML  SOLN COMPARISON:  CT 04/25/2020 and older FINDINGS: Lower chest: Breathing motion. Dependent bandlike areas of opacity at the bases likely scar or atelectasis. No pleural effusion. Hepatobiliary: Patent portal vein. There are some benign-appearing cysts identified in the liver such as medially in the right hepatic lobe measuring 15 mm, unchanged from prior. Benign cyst no specific imaging follow-up. There is also focal fat deposition seen in the liver adjacent to the falciform ligament segment 4. Mild intrahepatic biliary duct ectasia identified with the common duct does taper towards the pancreatic head. Gallbladder is distended. Pancreas: Dilatation of the pancreatic duct identified measuring up to 3 mm, overall mild. No discrete pancreatic mass. 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 Spleen: Normal in size without focal abnormality. Adrenals/Urinary Tract: The adrenal glands are preserved. No enhancing renal mass or collecting system dilatation. There are  some tiny cysts identified in the right kidney, Bosniak 1 and 2 lesions. No specific imaging follow-up. These were seen on the previous noncontrast examination. There is brush appearance to calices bilaterally, please correlate for tubular ectasia. The ureters have normal course and caliber down to the bladder. Preserved contours of the urinary bladder. Stomach/Bowel: No oral contrast. The large bowel has significant colonic diverticulosis of the sigmoid colon. The large bowel overall is relatively decompressed diffusely. Stomach is nondilated. There are several fluid-filled loops of small bowel with diameter approaching 2.9-3 cm, slightly dilated. Surgical changes along loops of bowel in the central upper pelvis. There is a transition to some nondilated bowel identified at the terminal ileum where there is specific fold thickening. Adjacent stranding. Please correlate for an enteritis although there is a differential including infectious, inflammatory and ischemic process. Of note there is prominent second portion duodenal diverticulum. Multiple small bowel diverticula elsewhere as well. Vascular/Lymphatic: Aortic atherosclerosis. No enlarged abdominal or pelvic lymph nodes. Reproductive: Multilobular uterus consistent with multiple fibroids. Other: Trace free fluid in the pelvis. No free intra-air identified. Musculoskeletal: Curvature of the spine. Scattered degenerative changes of the spine and pelvis. IMPRESSION: Mildly dilated fluid-filled loops of small bowel diffusely with transition at the level of the distal ileum where there is some fold thickening and stranding. Please correlate for etiology including differential of infectious, inflammatory and ischemic process. Trace free fluid and mesenteric stranding. No free air. Colonic diverticulosis. Mild dilatation of the biliary tree but normal tapering of the common duct towards the pancreatic head. Mild ectasia of the pancreatic duct as well with pancreatic  divisum. Electronically Signed   By: Karen Kays M.D.   On: 12/24/2022 13:15      Assessment/Plan Small bowel enteritis vs Psbo The patient has been seen, examined, chart, labs, vitals, and imaging personally reviewed.  She appears to have an area in her distal small bowel with wall thickening suspicious for enteritis.  I suspect  her WBC is at least partially elevated from dehydration, but given elevation of 23K and her prior improvement with abx during a bout of similar symptoms in April I think it is reasonable to start abx therapy to treat for possible infectious etiology.  I do not think she needs emergency surgery at this time. We would recommend bowel rest for now and conservative management. Can hold off on NGT. If she develops any worsening abdominal pain/distension, nausea or vomiting would recommend NGT - this was discussed with the patient. Plan to start SBO protocol in the AM. Keep K > 4, Mg > 2 and mobilize as able for bowel function. Hopefully she will improve with conservative management. If patient fails to improve with conservative management, they may require exploratory surgery during admission. Agree with medical admission.  We will closely follow with you.  FEN - NPO/IVFs VTE - SCDs, ok for chemical prophylaxis from our standpoint ID - Zosyn  I reviewed nursing notes, last 24 h vitals and pain scores, last 48 h intake and output, last 24 h labs and trends, and last 24 h imaging results.  Leary Roca, [facility] 12/24/2022, 2:42 PM Please see Amion for pager number during day hours 7:00am-4:30pm or 7:00am -11:30am on weekends

## 2022-12-24 NOTE — ED Notes (Signed)
Back from radiology via stretcher, alert, NAD, calm, interactive.

## 2022-12-24 NOTE — ED Triage Notes (Signed)
Here by POV from home for constipation, unable to void, abd pain, "feels dehydratedd/dry", vomited x1 this am, h/o SBO with emergency surgery in 2014, has not taken her BP meds. States, "Lives with handicapped husband".

## 2022-12-24 NOTE — Progress Notes (Signed)
Pharmacy Antibiotic Note  Ellen [name] is a 82 y.o. female admitted on 12/24/2022 with IAI.  Pharmacy has been consulted for zosyn dosing.  Plan: Zosyn 3.375 gm IV x 1 dose over 30 minutes then zosyn 3.375 mg IV q8h infuse each dose over 4 hours Pharmacy to sign off   Height: 5\' 4"  (162.6 cm) Weight: 51.7 kg (114 lb) IBW/kg (Calculated) : 54.7  Temp (24hrs), Avg:97.7 F (36.5 C), Min:97.5 F (36.4 C), Max:97.9 F (36.6 C)  Recent Labs  Lab 12/24/22 1119 12/24/22 1403  WBC 22.9*  --   CREATININE 0.67  --   LATICACIDVEN  --  1.0    Estimated Creatinine Clearance: 43.5 mL/min (by C-G formula based on SCr of 0.67 mg/dL).    Allergies  Allergen Reactions   Lactose Intolerance (Gi) Diarrhea   Sulfa Antibiotics Nausea And Vomiting and Swelling    Thank you for allowing pharmacy to be a part of this patient's care.  Herby Abraham, Pharm.D Use secure chat for questions 12/24/2022 3:50 PM

## 2022-12-24 NOTE — ED Provider Notes (Signed)
Ellen [name] Provider Note   CSN: [id number] Arrival date & time: 12/24/22  1012     History  Chief Complaint  Patient presents with   Constipation    Ellen [name] is a 82 y.o. female with PMH as listed below who presents with abdominal pain, nausea/vomiting, constipation. Last BM was couple days ago. She states she hasn't peed in the last 24 hours either but doesn't have the urge, thinks she is very dehydrated. Very unusual for her. Has h/o SBO in 2014 treated surgically, states this feels similar. All over severe constant abd pain and bloating. Has not been able to take her BP meds. Currently now rated pain 5/10, but this morning was 9/10. Denies f/c, CP, SOB, dysuria/hematuria, vaginal symptoms, hematochezia/melena.    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 Authorizing Provider  acetaminophen (TYLENOL) 325 MG tablet Take 650 mg by mouth as needed for moderate pain.   Yes [provider]  Calcium Carbonate Antacid (TUMS PO) Take 1 tablet by mouth as needed (reflux).   Yes [provider]  Carboxymethylcellulose Sodium (EYE DROPS OP) Place 2 drops into both eyes daily. Thera Tears   Yes [provider]  hydrochlorothiazide (MICROZIDE) 12.5 MG capsule TAKE 1 CAPSULE(12.5 MG) BY MOUTH DAILY 06/29/22  Yes Ardith Dark, MD  ibuprofen (ADVIL) 200 MG tablet Take 200 mg by mouth as needed for moderate pain.   Yes [provider]  losartan (COZAAR) 50 MG tablet TAKE 1 TABLET(50 MG) BY MOUTH DAILY 06/29/22  Yes Ardith Dark, MD  pantoprazole (PROTONIX) 40  MG tablet TAKE 1 TABLET(40 MG) BY MOUTH DAILY Patient taking differently: Take 40 mg by mouth daily as needed (reflux). 10/07/22  Yes Imogene Burn, MD  PREMPRO 0.45-1.5 MG tablet TAKE 1 TABLET BY MOUTH DAILY Patient taking differently: Take 1 tablet by mouth every 3 (three) days. 11/09/22  Yes Ardith Dark, MD      Allergies    Lactose intolerance (gi) and Sulfa antibiotics    Review of Systems   Review of Systems A 10 point review of systems was performed and is negative unless otherwise reported in HPI.  Physical Exam Updated Vital Signs BP (!) 170/90   Pulse 91   Temp 97.9 F (36.6 C) (Oral)   Resp 17   Ht 5\' 4"  (1.626 m)   Wt 51.7 kg   SpO2 95%   BMI 19.57 kg/m  Physical Exam General: Normal appearing female, lying in bed.  HEENT: Sclera anicteric, dry mucous membranes, trachea midline.  Cardiology: RRR, no murmurs/rubs/gallops. BL radial and DP pulses equal bilaterally.  Resp: Normal respiratory rate and effort. CTAB, no wheezes, rhonchi, crackles.  Abd: Soft, mildly distended. Ventral hernia soft, no overlying skin changes. Diffusely tender to palpation. No rebound tenderness or guarding.  GU: Deferred. MSK: No peripheral edema or signs of trauma. Extremities without deformity or TTP. No cyanosis or clubbing. Skin: warm, dry.  Neuro: A&Ox4, CNs II-XII grossly intact. MAEs. Sensation grossly intact.  Psych:  Normal mood and affect.   ED Results / Procedures / Treatments   Labs (all labs ordered are listed, but only abnormal results are displayed) Labs Reviewed  COMPREHENSIVE METABOLIC PANEL - Abnormal; Notable for the following components:      Result Value   Glucose, Bld 131 (*)    BUN 26 (*)    All other components within normal limits  CBC WITH DIFFERENTIAL/PLATELET - Abnormal; Notable for the following components:   WBC 22.9 (*)    Hemoglobin 16.1 (*)    HCT 47.6 (*)    Neutro Abs 20.3 (*)    Monocytes Absolute 1.2 (*)    Abs Immature Granulocytes 0.24  (*)    All other components within normal limits  LIPASE, BLOOD  LACTIC ACID, PLASMA  MAGNESIUM  PHOSPHORUS  URINALYSIS, ROUTINE W REFLEX MICROSCOPIC  LACTIC ACID, PLASMA  I-STAT CG4 LACTIC ACID, ED    EKG None  Radiology CT ABDOMEN PELVIS W CONTRAST  Result Date: 12/24/2022 CLINICAL DATA:  Constipation. Unable to void. Increasing abdominal pain. EXAM: CT ABDOMEN AND PELVIS WITH CONTRAST TECHNIQUE: Multidetector CT imaging of the abdomen and pelvis was performed using the standard protocol following bolus administration of intravenous contrast. RADIATION DOSE REDUCTION: This exam was performed according to the departmental dose-optimization program which includes automated exposure control, adjustment of the mA and/or kV according to patient size and/or use of iterative reconstruction technique. CONTRAST:  OMNIPAQUE IOHEXOL 300 MG/ML  SOLN COMPARISON:  CT 04/25/2020 and older FINDINGS: Lower chest: Breathing motion. Dependent bandlike areas of opacity at the bases likely scar or atelectasis. No pleural effusion. Hepatobiliary: Patent portal vein. There are some benign-appearing cysts identified in the liver such as medially in the right hepatic lobe measuring 15 mm, unchanged from prior. Benign cyst no specific imaging follow-up. There is also focal fat deposition seen in the liver adjacent to the falciform ligament segment 4. Mild intrahepatic biliary duct ectasia identified with the common duct does taper towards the pancreatic head. Gallbladder is distended. Pancreas: Dilatation of the pancreatic duct identified measuring up to 3 mm, overall mild. No discrete pancreatic mass. 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 Spleen: Normal in size without focal abnormality. Adrenals/Urinary Tract: The adrenal glands are preserved. No enhancing renal mass or collecting system dilatation. There are some tiny cysts identified in the right kidney, Bosniak 1 and 2 lesions. No specific imaging  follow-up. These were seen on the previous noncontrast examination. There is brush appearance to calices bilaterally, please correlate for tubular ectasia. The ureters have normal course and caliber down to the bladder. Preserved contours of the urinary bladder. Stomach/Bowel: No oral contrast. The large bowel has significant colonic diverticulosis of the sigmoid colon. The large bowel overall is relatively decompressed diffusely. Stomach is nondilated. There are several fluid-filled loops of small bowel with diameter approaching 2.9-3 cm, slightly dilated. Surgical changes along loops of bowel in the central upper pelvis. There is a transition to some nondilated bowel identified at the terminal ileum where there is specific fold thickening. Adjacent stranding. Please correlate for an enteritis although there is a differential including infectious, inflammatory and ischemic process. Of note there is prominent second portion duodenal diverticulum. Multiple small bowel diverticula elsewhere as well. Vascular/Lymphatic: Aortic atherosclerosis. No enlarged abdominal or pelvic lymph nodes. Reproductive: Multilobular uterus consistent with multiple fibroids. Other: Trace free fluid in the pelvis. No free intra-air identified. Musculoskeletal: Curvature of the spine. Scattered degenerative changes of the spine and pelvis. IMPRESSION: Mildly dilated fluid-filled  loops of small bowel diffusely with transition at the level of the distal ileum where there is some fold thickening and stranding. Please correlate for etiology including differential of infectious, inflammatory and ischemic process. Trace free fluid and mesenteric stranding. No free air. Colonic diverticulosis. Mild dilatation of the biliary tree but normal tapering of the common duct towards the pancreatic head. 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 Electronically Signed   By: Karen Kays M.D.   On: 12/24/2022 13:15     Procedures Procedures    Medications Ordered in ED Medications  0.9 % NaCl with KCl 20 mEq/ L  infusion (has no administration in time range)  pantoprazole (PROTONIX) injection 40 mg (has no administration in time range)  labetalol (NORMODYNE) injection 10 mg (has no administration in time range)  sodium chloride 0.9 % bolus 500 mL (has no administration in time range)  enoxaparin (LOVENOX) injection 40 mg (has no administration in time range)  acetaminophen (TYLENOL) tablet 650 mg (has no administration in time range)    Or  acetaminophen (TYLENOL) suppository 650 mg (has no administration in time range)  ondansetron (ZOFRAN) tablet 4 mg (has no administration in time range)    Or  ondansetron (ZOFRAN) injection 4 mg (has no administration in time range)  HYDROmorphone (DILAUDID) injection 0.5 mg (has no administration in time range)  ondansetron (ZOFRAN) injection 4 mg (4 mg Intravenous Given 12/24/22 1143)  HYDROmorphone (DILAUDID) injection 0.25 mg (0.25 mg Intravenous Given 12/24/22 1147)  lactated ringers bolus 500 mL (0 mLs Intravenous Stopped 12/24/22 1310)  iohexol (OMNIPAQUE) 300 MG/ML solution 100 mL (100 mLs Intravenous Contrast Given 12/24/22 1231)    ED Course/ Medical Decision Making/ A&P                          Medical Decision Making Amount and/or Complexity of Data Reviewed Labs: ordered. Decision-making details documented in ED Course. Radiology: ordered. Decision-making details documented in ED Course.  Risk Prescription drug management. Decision regarding hospitalization.    This patient presents to the ED for concern of abd pain, this involves an extensive number of treatment options, and is a complaint that carries with it a high risk of complications and morbidity.  I considered the following differential and admission for this acute, potentially life threatening condition.   MDM:    For DDX for abdominal pain includes but is not limited to:  Abdominal  exam without peritoneal signs. No evidence of acute abdomen at this time. Low suspicion for acute hepatobiliary disease (including acute cholecystitis or cholangitis), acute pancreatitis (neg lipase), PUD (including gastric perforation), acute infectious processes (pneumonia, hepatitis, pyelonephritis), acute appendicitis, vascular catastrophe, bowel obstruction, viscus perforation, or diverticulitis.   Consider possible anuria vs urinary retention, more likely anuria given that she doesn't have the urge to pee and has had significantly decreased PO intake. Consider renal injury, electrolyte derangements. BP is actually high d/t lack of BP meds at home d/t nausea/vomiting. Will give fluids for dehydration and likely will give IV BP control.   Clinical Course as of 12/24/22 1501  Thu Dec 24, 2022  1202 WBC(!): 22.9 +Leukocytosis w/ left shfit [HN]  1203 Hemoglobin(!): 16.1 Likely volume contraction [HN]  1203 Lipase: 28 neg [HN]  1210 Comprehensive metabolic panel(!) unremarkable [HN]  1337 CT ABDOMEN PELVIS W CONTRAST Mildly dilated fluid-filled loops of small bowel diffusely with transition at the level of the distal ileum where there is  some fold thickening and stranding. Please correlate for etiology including differential of infectious, inflammatory and ischemic process. Trace free fluid and mesenteric stranding. No free air.  Colonic diverticulosis.  Mild dilatation of the biliary tree but normal tapering of the common duct towards the pancreatic head. 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   [HN]  1342 BP(!): 165/88 BP decreased after IV hydralazine [HN]  1346 D/w radiologist about scan. Would not have further utility in getting CT angio. Will place NG tube for decompression, call patient for admission to hospitalist for enteritis vs early SBO. [HN]  1407 Admitted to Dr. Robb Matar. Pending surgical consult. [HN]  1500 Gen surg agrees with plan and will come  to see the patient.e [HN]    Clinical Course User Index [HN] Loetta Rough, MD    Labs: I Ordered, and personally interpreted labs.  The pertinent results include:  those listed above  Imaging Studies ordered: I ordered imaging studies including CT abd pelvis w/ contrast I independently visualized and interpreted imaging. I agree with the radiologist interpretation  Additional history obtained from chart review.   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   Left Buccal Cheek (Keratoacanthoma)   Umbilical hernia      Medicines Meds ordered this encounter  Medications   ondansetron (ZOFRAN) injection 4 mg   DISCONTD: hydrALAZINE (APRESOLINE) injection 5 mg   HYDROmorphone (DILAUDID) injection 0.25 mg   lactated ringers bolus 500 mL   DISCONTD: hydrALAZINE (APRESOLINE) injection 5 mg   iohexol (OMNIPAQUE) 300 MG/ML solution 100 mL   0.9 % NaCl with KCl 20 mEq/ L  infusion   pantoprazole (PROTONIX) injection 40 mg   labetalol (NORMODYNE) injection 10 mg   sodium chloride 0.9 % bolus 500 mL   enoxaparin (LOVENOX) injection 40 mg   OR Linked Order Group    acetaminophen (TYLENOL) tablet 650 mg    acetaminophen (TYLENOL) suppository 650 mg   OR Linked Order Group    ondansetron (ZOFRAN) tablet 4 mg    ondansetron (ZOFRAN) injection 4 mg   HYDROmorphone (DILAUDID) injection 0.5 mg    I have reviewed the patients home medicines and have made adjustments as needed  Problem List / ED Course: Problem List  Items Addressed This Visit   None Visit Diagnoses     Enteritis    -  Primary   Generalized abdominal pain                       This note was created using dictation software, which may contain spelling or grammatical errors.    Loetta Rough, MD 12/24/22 1501

## 2022-12-24 NOTE — ED Notes (Signed)
ED TO INPATIENT HANDOFF REPORT  ED Nurse Name and Phone #: Al Corpus, RN [phone]  S Name/Age/Gender Ellen [name] 82 y.o. female Room/Bed: WA08/WA08  Code Status   Code Status: Full Code  Home/SNF/Other Home Patient oriented to: self, place, time, and situation Is this baseline? Yes   Triage Complete: Triage complete  Chief Complaint Constipation [K59.00]  Triage Note Here by POV from home for constipation, unable to void, abd pain, "feels dehydratedd/dry", vomited x1 this am, h/o SBO with emergency surgery in 2014, has not taken her BP meds. States, "Lives with handicapped husband".    Allergies Allergies  Allergen Reactions   Lactose Intolerance (Gi) Diarrhea   Sulfa Antibiotics Nausea And Vomiting and Swelling    Level of Care/Admitting Diagnosis ED Disposition     ED Disposition  Admit   Condition  --   Comment  Hospital Area: [facility] [[id number]]  Level of Care: Med-Surg [16]  May place patient in observation at [facility] or [facility] if equivalent level of care is available:: No  Covid Evaluation: Asymptomatic - no recent exposure (last 10 days) testing not required  Diagnosis: Constipation [010272]  Admitting Physician: Bobette Mo [[id number]]  Attending Physician: Bobette Mo [[id number]]          B Medical/Surgery History 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   Left Buccal Cheek (Keratoacanthoma)   Umbilical hernia    Past Surgical History:  Procedure Laterality Date   BOWEL RESECTION  07/09/2013   Procedure: SMALL BOWEL RESECTION;  Surgeon: Velora Heckler, MD;  Location: [facility];  Service: General;;   LAPAROTOMY N/A 07/09/2013   Procedure: EXPLORATORY LAPAROTOMY;  Surgeon: Velora Heckler, MD;  Location: [facility];   Service: General;  Laterality: N/A;   SHOULDER ARTHROSCOPY WITH CAPSULORRHAPHY       A IV Location/Drains/Wounds Patient Lines/Drains/Airways Status     Active Line/Drains/Airways     Name Placement date Placement time Site Days   Peripheral IV 12/24/22 20 G 1" Anterior;Proximal;Right;Medial Forearm 12/24/22  1117  Forearm  less than 1            Intake/Output Last 24 hours  Intake/Output Summary (Last 24 hours) at 12/24/2022 1742 Last data filed at 12/24/2022 1632 Gross per 24 hour  Intake 1450 ml  Output --  Net 1450 ml    Labs/Imaging Results for orders placed or performed during the hospital encounter of 12/24/22 (from the past 48 hour(s))  Comprehensive metabolic panel     Status: Abnormal   Collection Time: 12/24/22 11:19 AM  Result Value Ref Range   Sodium 137 135 - 145 mmol/L   Potassium 3.8 3.5 - 5.1 mmol/L   Chloride 102 98 - 111 mmol/L   CO2 25 22 - 32 mmol/L   Glucose, Bld 131 (H) 70 - 99 mg/dL    Comment: Glucose reference range applies only to samples taken after fasting for at least 8 hours.   BUN 26 (H) 8 - 23 mg/dL   Creatinine, Ser 5.63 0.44 - 1.00 mg/dL   Calcium 9.6 8.9 - 87.5 mg/dL   Total Protein 6.6 6.5 - 8.1 g/dL   Albumin 3.6 3.5 - 5.0 g/dL   AST 19  15 - 41 U/L   ALT 20 0 - 44 U/L   Alkaline Phosphatase 80 38 - 126 U/L   Total Bilirubin 0.8 0.3 - 1.2 mg/dL   GFR, Estimated >09 >81 mL/min    Comment: (NOTE) Calculated using the CKD-EPI Creatinine Equation (2021)    Anion gap 10 5 - 15    Comment: Performed at [facility], [address], [place], [place] [address]  Lipase, blood     Status: None   Collection Time: 12/24/22 11:19 AM  Result Value Ref Range   Lipase 28 11 - 51 U/L    Comment: Performed at [facility], [address], [place], [place] [address]  CBC with Diff     Status: Abnormal   Collection Time: 12/24/22 11:19 AM  Result Value Ref Range   WBC 22.9 (H) 4.0 - 10.5 K/uL   RBC 4.84  3.87 - 5.11 MIL/uL   Hemoglobin 16.1 (H) 12.0 - 15.0 g/dL   HCT 21.3 (H) 08.6 - 57.8 %   MCV 98.3 80.0 - 100.0 fL   MCH 33.3 26.0 - 34.0 pg   MCHC 33.8 30.0 - 36.0 g/dL   RDW 46.9 62.9 - 52.8 %   Platelets 308 150 - 400 K/uL   nRBC 0.0 0.0 - 0.2 %   Neutrophils Relative % 89 %   Neutro Abs 20.3 (H) 1.7 - 7.7 K/uL   Lymphocytes Relative 5 %   Lymphs Abs 1.1 0.7 - 4.0 K/uL   Monocytes Relative 5 %   Monocytes Absolute 1.2 (H) 0.1 - 1.0 K/uL   Eosinophils Relative 0 %   Eosinophils Absolute 0.0 0.0 - 0.5 K/uL   Basophils Relative 0 %   Basophils Absolute 0.1 0.0 - 0.1 K/uL   Immature Granulocytes 1 %   Abs Immature Granulocytes 0.24 (H) 0.00 - 0.07 K/uL    Comment: Performed at [facility], [address] [place], [place] [address]  Magnesium     Status: None   Collection Time: 12/24/22 11:19 AM  Result Value Ref Range   Magnesium 2.3 1.7 - 2.4 mg/dL    Comment: Performed at [facility], [address], [place], [place] [address]  Phosphorus     Status: None   Collection Time: 12/24/22 11:19 AM  Result Value Ref Range   Phosphorus 4.2 2.5 - 4.6 mg/dL    Comment: Performed at [facility], [address] [place], [place] [address]  Lactic acid, plasma     Status: None   Collection Time: 12/24/22  2:03 PM  Result Value Ref Range   Lactic Acid, Venous 1.0 0.5 - 1.9 mmol/L    Comment: Performed at [facility], [address] [place], [place] [address]   CT ABDOMEN PELVIS W CONTRAST  Result Date: 12/24/2022 CLINICAL DATA:  Constipation. Unable to void. Increasing abdominal pain. EXAM: CT ABDOMEN AND PELVIS WITH CONTRAST TECHNIQUE: Multidetector CT imaging of the abdomen and pelvis was performed using the standard protocol following bolus administration of intravenous contrast. RADIATION DOSE REDUCTION: This exam was performed according to the departmental dose-optimization program which includes automated  exposure control, adjustment of the mA and/or kV according to patient size and/or use of iterative reconstruction technique. CONTRAST:  OMNIPAQUE IOHEXOL 300 MG/ML  SOLN COMPARISON:  CT 04/25/2020 and older FINDINGS: Lower chest: Breathing motion. Dependent bandlike areas of opacity at the bases likely scar or atelectasis. No pleural effusion. Hepatobiliary: Patent  portal vein. There are some benign-appearing cysts identified in the liver such as medially in the right hepatic lobe measuring 15 mm, unchanged from prior. Benign cyst no specific imaging follow-up. There is also focal fat deposition seen in the liver adjacent to the falciform ligament segment 4. Mild intrahepatic biliary duct ectasia identified with the common duct does taper towards the pancreatic head. Gallbladder is distended. Pancreas: Dilatation of the pancreatic duct identified measuring up to 3 mm, overall mild. No discrete pancreatic mass. 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 Spleen: Normal in size without focal abnormality. Adrenals/Urinary Tract: The adrenal glands are preserved. No enhancing renal mass or collecting system dilatation. There are some tiny cysts identified in the right kidney, Bosniak 1 and 2 lesions. No specific imaging follow-up. These were seen on the previous noncontrast examination. There is brush appearance to calices bilaterally, please correlate for tubular ectasia. The ureters have normal course and caliber down to the bladder. Preserved contours of the urinary bladder. Stomach/Bowel: No oral contrast. The large bowel has significant colonic diverticulosis of the sigmoid colon. The large bowel overall is relatively decompressed diffusely. Stomach is nondilated. There are several fluid-filled loops of small bowel with diameter approaching 2.9-3 cm, slightly dilated. Surgical changes along loops of bowel in the central upper pelvis. There is a transition to some nondilated bowel identified at the terminal  ileum where there is specific fold thickening. Adjacent stranding. Please correlate for an enteritis although there is a differential including infectious, inflammatory and ischemic process. Of note there is prominent second portion duodenal diverticulum. Multiple small bowel diverticula elsewhere as well. Vascular/Lymphatic: Aortic atherosclerosis. No enlarged abdominal or pelvic lymph nodes. Reproductive: Multilobular uterus consistent with multiple fibroids. Other: Trace free fluid in the pelvis. No free intra-air identified. Musculoskeletal: Curvature of the spine. Scattered degenerative changes of the spine and pelvis. IMPRESSION: Mildly dilated fluid-filled loops of small bowel diffusely with transition at the level of the distal ileum where there is some fold thickening and stranding. Please correlate for etiology including differential of infectious, inflammatory and ischemic process. Trace free fluid and mesenteric stranding. No free air. Colonic diverticulosis. Mild dilatation of the biliary tree but normal tapering of the common duct towards the pancreatic head. 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 Electronically Signed   By: Karen Kays M.D.   On: 12/24/2022 13:15    Pending Labs Unresulted Labs (From admission, onward)     Start     Ordered   12/25/22 0500  CBC  Tomorrow morning,   R        12/24/22 1417   12/25/22 0500  Comprehensive metabolic panel  Tomorrow morning,   R        12/24/22 1417   12/24/22 1042  Urinalysis, Routine w reflex microscopic -Urine, Clean Catch  Once,   URGENT       Question:  Specimen Source  Answer:  Urine, Clean Catch   12/24/22 1041            Vitals/Pain Today's Vitals   12/24/22 1545 12/24/22 1556 12/24/22 1600 12/24/22 1615  BP: (!) 163/85  (!) 170/81 (!) 159/87  Pulse: 87  85 76  Resp:   16   Temp:   98.6 F (37 C)   TempSrc:   Oral   SpO2: 94%  95% 91%  Weight:      Height:      PainSc:  2        Isolation  Precautions No active isolations  Medications Medications  0.9 % NaCl with KCl 20 mEq/ L  infusion ( Intravenous Not Given 12/24/22 1604)  pantoprazole (PROTONIX) injection 40 mg (40 mg Intravenous Given 12/24/22 1636)  labetalol (NORMODYNE) injection 10 mg (10 mg Intravenous Given 12/24/22 1601)  enoxaparin (LOVENOX) injection 40 mg (has no administration in time range)  acetaminophen (TYLENOL) tablet 650 mg (has no administration in time range)    Or  acetaminophen (TYLENOL) suppository 650 mg (has no administration in time range)  ondansetron (ZOFRAN) tablet 4 mg (has no administration in time range)    Or  ondansetron (ZOFRAN) injection 4 mg (has no administration in time range)  HYDROmorphone (DILAUDID) injection 0.5 mg (has no administration in time range)  piperacillin-tazobactam (ZOSYN) IVPB 3.375 g (has no administration in time range)  ondansetron (ZOFRAN) injection 4 mg (4 mg Intravenous Given 12/24/22 1143)  HYDROmorphone (DILAUDID) injection 0.25 mg (0.25 mg Intravenous Given 12/24/22 1147)  lactated ringers bolus 500 mL (0 mLs Intravenous Stopped 12/24/22 1310)  iohexol (OMNIPAQUE) 300 MG/ML solution 100 mL (100 mLs Intravenous Contrast Given 12/24/22 1231)  sodium chloride 0.9 % bolus 500 mL (0 mLs Intravenous Stopped 12/24/22 1631)  piperacillin-tazobactam (ZOSYN) IVPB 3.375 g (0 g Intravenous Stopped 12/24/22 1632)    Mobility walks     Focused Assessments Cardiac Assessment Handoff:    No results found for: "CKTOTAL", "CKMB", "CKMBINDEX", "TROPONINI" No results found for: "DDIMER" Does the Patient currently have chest pain? No    R Recommendations: See Admitting Provider Note  Report given to:   Additional Notes: A&O, ambulates, steady gait, last BM 2d ago, no NGT or surgery planned at this time, Al Corpus, RN [phone]

## 2022-12-25 ENCOUNTER — Observation Stay (HOSPITAL_COMMUNITY): Payer: Medicare Other

## 2022-12-25 ENCOUNTER — Inpatient Hospital Stay (HOSPITAL_COMMUNITY): Payer: Medicare Other

## 2022-12-25 DIAGNOSIS — F419 Anxiety disorder, unspecified: Secondary | ICD-10-CM | POA: Diagnosis present

## 2022-12-25 DIAGNOSIS — Z833 Family history of diabetes mellitus: Secondary | ICD-10-CM | POA: Diagnosis not present

## 2022-12-25 DIAGNOSIS — K5909 Other constipation: Secondary | ICD-10-CM | POA: Diagnosis not present

## 2022-12-25 DIAGNOSIS — Z87891 Personal history of nicotine dependence: Secondary | ICD-10-CM | POA: Diagnosis not present

## 2022-12-25 DIAGNOSIS — E860 Dehydration: Secondary | ICD-10-CM | POA: Diagnosis present

## 2022-12-25 DIAGNOSIS — K5669 Other partial intestinal obstruction: Secondary | ICD-10-CM | POA: Diagnosis not present

## 2022-12-25 DIAGNOSIS — R739 Hyperglycemia, unspecified: Secondary | ICD-10-CM | POA: Diagnosis present

## 2022-12-25 DIAGNOSIS — Z79899 Other long term (current) drug therapy: Secondary | ICD-10-CM | POA: Diagnosis not present

## 2022-12-25 DIAGNOSIS — K5651 Intestinal adhesions [bands], with partial obstruction: Secondary | ICD-10-CM | POA: Diagnosis present

## 2022-12-25 DIAGNOSIS — K529 Noninfective gastroenteritis and colitis, unspecified: Secondary | ICD-10-CM | POA: Diagnosis present

## 2022-12-25 DIAGNOSIS — R1084 Generalized abdominal pain: Secondary | ICD-10-CM | POA: Diagnosis not present

## 2022-12-25 DIAGNOSIS — K56609 Unspecified intestinal obstruction, unspecified as to partial versus complete obstruction: Secondary | ICD-10-CM | POA: Diagnosis not present

## 2022-12-25 DIAGNOSIS — R112 Nausea with vomiting, unspecified: Secondary | ICD-10-CM | POA: Diagnosis not present

## 2022-12-25 DIAGNOSIS — K219 Gastro-esophageal reflux disease without esophagitis: Secondary | ICD-10-CM | POA: Diagnosis present

## 2022-12-25 DIAGNOSIS — R1310 Dysphagia, unspecified: Secondary | ICD-10-CM | POA: Diagnosis present

## 2022-12-25 DIAGNOSIS — R935 Abnormal findings on diagnostic imaging of other abdominal regions, including retroperitoneum: Secondary | ICD-10-CM | POA: Diagnosis not present

## 2022-12-25 DIAGNOSIS — D751 Secondary polycythemia: Secondary | ICD-10-CM | POA: Diagnosis present

## 2022-12-25 DIAGNOSIS — Z85828 Personal history of other malignant neoplasm of skin: Secondary | ICD-10-CM | POA: Diagnosis not present

## 2022-12-25 DIAGNOSIS — Z931 Gastrostomy status: Secondary | ICD-10-CM | POA: Diagnosis not present

## 2022-12-25 DIAGNOSIS — I10 Essential (primary) hypertension: Secondary | ICD-10-CM | POA: Diagnosis present

## 2022-12-25 DIAGNOSIS — Z8249 Family history of ischemic heart disease and other diseases of the circulatory system: Secondary | ICD-10-CM | POA: Diagnosis not present

## 2022-12-25 DIAGNOSIS — E876 Hypokalemia: Secondary | ICD-10-CM | POA: Diagnosis not present

## 2022-12-25 DIAGNOSIS — E739 Lactose intolerance, unspecified: Secondary | ICD-10-CM | POA: Diagnosis present

## 2022-12-25 DIAGNOSIS — Z882 Allergy status to sulfonamides status: Secondary | ICD-10-CM | POA: Diagnosis not present

## 2022-12-25 LAB — CBC
HCT: 40.7 % (ref 36.0–46.0)
Hemoglobin: 13.9 g/dL (ref 12.0–15.0)
MCH: 34.3 pg — ABNORMAL HIGH (ref 26.0–34.0)
MCHC: 34.2 g/dL (ref 30.0–36.0)
MCV: 100.5 fL — ABNORMAL HIGH (ref 80.0–100.0)
Platelets: 244 10*3/uL (ref 150–400)
RBC: 4.05 MIL/uL (ref 3.87–5.11)
RDW: 12.7 % (ref 11.5–15.5)
WBC: 12.5 10*3/uL — ABNORMAL HIGH (ref 4.0–10.5)
nRBC: 0 % (ref 0.0–0.2)

## 2022-12-25 MED ORDER — PHENOL 1.4 % MT LIQD
1.0000 | OROMUCOSAL | Status: DC | PRN
  Administered 2022-12-25: 1 via OROMUCOSAL
  Filled 2022-12-25: qty 177

## 2022-12-25 MED ORDER — HYDRALAZINE HCL 20 MG/ML IJ SOLN: 10.0000 mg | Freq: Three times a day (TID) | INTRAMUSCULAR | Status: DC

## 2022-12-25 MED ORDER — HYDRALAZINE HCL 20 MG/ML IJ SOLN
10.0000 mg | Freq: Three times a day (TID) | INTRAMUSCULAR | Status: DC
  Administered 2022-12-25 – 2022-12-27 (×6): 10 mg via INTRAVENOUS
  Filled 2022-12-25 (×6): qty 1

## 2022-12-25 MED ORDER — PROCHLORPERAZINE EDISYLATE 10 MG/2ML IJ SOLN
5.0000 mg | Freq: Four times a day (QID) | INTRAMUSCULAR | Status: DC | PRN
  Administered 2022-12-25: 5 mg via INTRAVENOUS
  Filled 2022-12-25: qty 2

## 2022-12-25 MED ORDER — LACTATED RINGERS IV SOLN: INTRAVENOUS | Status: DC

## 2022-12-25 MED ORDER — DIATRIZOATE MEGLUMINE & SODIUM 66-10 % PO SOLN
90.0000 mL | Freq: Once | ORAL | Status: AC
  Administered 2022-12-25: 90 mL via ORAL
  Filled 2022-12-25: qty 90

## 2022-12-25 NOTE — Plan of Care (Signed)
  Problem: Skin Integrity: Goal: Risk for impaired skin integrity will decrease Outcome: Progressing   Problem: Skin Integrity: Goal: Risk for impaired skin integrity will decrease Outcome: Progressing

## 2022-12-25 NOTE — Progress Notes (Signed)
Spoke w/ pt in room; she identified POC [place] [phone]); Brief Assessment completed.

## 2022-12-25 NOTE — Progress Notes (Signed)
Subjective: CC: Stable generalized abdominal pain that is worst in the LLQ with associated bloating/distension. Intermittent nausea but none currently. Starting to pass flatus. No BM.   Afebrile. No tachycardia or hypotension. WBC 22.9 > 12.5. Lactic wnl yesterday.   Objective: Vital signs in last 24 hours: Temp:  [97.5 F (36.4 C)-98.6 F (37 C)] 98 F (36.7 C) (08/09 0526) Pulse Rate:  [76-99] 84 (08/09 0526) Resp:  [16-18] 18 (08/09 0526) BP: (140-208)/(76-104) 162/76 (08/09 0526) SpO2:  [91 %-97 %] 93 % (08/09 0526) Weight:  [51.7 kg] 51.7 kg (08/08 1027) Last BM Date : 12/21/22  Intake/Output from previous day: 08/08 0701 - 08/09 0700 In: 2049 [I.V.:999; IV Piggyback:1050] Out: 300 [Urine:300] Intake/Output this shift: No intake/output data recorded.  PE: Gen:  Alert, NAD, pleasant Abd: Mild to moderate distension but very soft. Generalized ttp without rigidity or guarding. Small incisional hernia that is very soft without overlying skin changes and appears to easily reduce and then will recur. This did not look like the obvious point of transition on CT.   Lab Results:  Recent Labs    12/24/22 1119 12/25/22 0541  WBC 22.9* 12.5*  HGB 16.1* 13.9  HCT 47.6* 40.7  PLT 308 244   BMET Recent Labs    12/24/22 1119 12/25/22 0429  NA 137 135  K 3.8 4.2  CL 102 103  CO2 25 23  GLUCOSE 131* 124*  BUN 26* 21  CREATININE 0.67 0.67  CALCIUM 9.6 8.4*   PT/INR No results for input(s): "LABPROT", "INR" in the last 72 hours. CMP     Component Value Date/Time   NA 135 12/25/2022 0429   NA 143 04/07/2017 0000   K 4.2 12/25/2022 0429   CL 103 12/25/2022 0429   CO2 23 12/25/2022 0429   GLUCOSE 124 (H) 12/25/2022 0429   BUN 21 12/25/2022 0429   BUN 27 (A) 04/07/2017 0000   CREATININE 0.67 12/25/2022 0429   CREATININE 0.75 10/21/2016 0807   CALCIUM 8.4 (L) 12/25/2022 0429   PROT 5.1 (L) 12/25/2022 0429   ALBUMIN 2.8 (L) 12/25/2022 0429   AST 14 (L)  12/25/2022 0429   ALT 16 12/25/2022 0429   ALKPHOS 59 12/25/2022 0429   BILITOT 0.7 12/25/2022 0429   GFRNONAA >60 12/25/2022 0429   GFRAA >60 01/10/2015 0402   Lipase     Component Value Date/Time   LIPASE 28 12/24/2022 1119    Studies/Results: CT ABDOMEN PELVIS W CONTRAST  Result Date: 12/24/2022 CLINICAL DATA:  Constipation. Unable to void. Increasing abdominal pain. EXAM: CT ABDOMEN AND PELVIS WITH CONTRAST TECHNIQUE: Multidetector CT imaging of the abdomen and pelvis was performed using the standard protocol following bolus administration of intravenous contrast. RADIATION DOSE REDUCTION: This exam was performed according to the departmental dose-optimization program which includes automated exposure control, adjustment of the mA and/or kV according to patient size and/or use of iterative reconstruction technique. CONTRAST:  OMNIPAQUE IOHEXOL 300 MG/ML  SOLN COMPARISON:  CT 04/25/2020 and older FINDINGS: Lower chest: Breathing motion. Dependent bandlike areas of opacity at the bases likely scar or atelectasis. No pleural effusion. Hepatobiliary: Patent portal vein. There are some benign-appearing cysts identified in the liver such as medially in the right hepatic lobe measuring 15 mm, unchanged from prior. Benign cyst no specific imaging follow-up. There is also focal fat deposition seen in the liver adjacent to the falciform ligament segment 4. Mild intrahepatic biliary duct ectasia identified with the common duct  does taper towards the pancreatic head. Gallbladder is distended. Pancreas: Dilatation of the pancreatic duct identified measuring up to 3 mm, overall mild. No discrete pancreatic mass. 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 Spleen: Normal in size without focal abnormality. Adrenals/Urinary Tract: The adrenal glands are preserved. No enhancing renal mass or collecting system dilatation. There are some tiny cysts identified in the right kidney, Bosniak 1 and 2 lesions. No  specific imaging follow-up. These were seen on the previous noncontrast examination. There is brush appearance to calices bilaterally, please correlate for tubular ectasia. The ureters have normal course and caliber down to the bladder. Preserved contours of the urinary bladder. Stomach/Bowel: No oral contrast. The large bowel has significant colonic diverticulosis of the sigmoid colon. The large bowel overall is relatively decompressed diffusely. Stomach is nondilated. There are several fluid-filled loops of small bowel with diameter approaching 2.9-3 cm, slightly dilated. Surgical changes along loops of bowel in the central upper pelvis. There is a transition to some nondilated bowel identified at the terminal ileum where there is specific fold thickening. Adjacent stranding. Please correlate for an enteritis although there is a differential including infectious, inflammatory and ischemic process. Of note there is prominent second portion duodenal diverticulum. Multiple small bowel diverticula elsewhere as well. Vascular/Lymphatic: Aortic atherosclerosis. No enlarged abdominal or pelvic lymph nodes. Reproductive: Multilobular uterus consistent with multiple fibroids. Other: Trace free fluid in the pelvis. No free intra-air identified. Musculoskeletal: Curvature of the spine. Scattered degenerative changes of the spine and pelvis. IMPRESSION: Mildly dilated fluid-filled loops of small bowel diffusely with transition at the level of the distal ileum where there is some fold thickening and stranding. Please correlate for etiology including differential of infectious, inflammatory and ischemic process. Trace free fluid and mesenteric stranding. No free air. Colonic diverticulosis. Mild dilatation of the biliary tree but normal tapering of the common duct towards the pancreatic head. 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 Electronically Signed   By: Karen Kays M.D.   On: 12/24/2022 13:15     Anti-infectives: Anti-infectives (From admission, onward)    Start     Dose/Rate Route Frequency Ordered Stop   12/24/22 2200  piperacillin-tazobactam (ZOSYN) IVPB 3.375 g        3.375 g 12.5 mL/hr over 240 Minutes Intravenous Every 8 hours 12/24/22 1549     12/24/22 1600  piperacillin-tazobactam (ZOSYN) IVPB 3.375 g        3.375 g 100 mL/hr over 30 Minutes Intravenous  Once 12/24/22 1548 12/24/22 1632   12/24/22 1545  piperacillin-tazobactam (ZOSYN) IVPB 3.375 g  Status:  Discontinued        3.375 g 100 mL/hr over 30 Minutes Intravenous Every 8 hours 12/24/22 1540 12/24/22 1548        Assessment/Plan Small bowel enteritis vs Psbo - No indication for emergency surgery - Cont abx - Keep NPO. Can hold off on NGT. If she develops any worsening abdominal pain/distension, nausea or vomiting would recommend NGT - this was discussed with the patient. -- Start SBO protocol orally.  - Keep K > 4, Mg > 2 and mobilize as able for bowel function.  - Hopefully she will improve with conservative management. If patient fails to improve with conservative management, they may require exploratory surgery during admission.  - We will closely follow with you.   FEN - NPO/IVFs VTE - SCDs, ok for chemical prophylaxis from our standpoint ID - Zosyn  I reviewed nursing notes, last 24 h  vitals and pain scores, last 48 h intake and output, last 24 h labs and trends, and last 24 h imaging results.   LOS: 0 days    Jacinto Halim , [facility] Surgery 12/25/2022, 8:36 AM Please see Amion for pager number during day hours 7:00am-4:30pm

## 2022-12-25 NOTE — Progress Notes (Signed)
PROGRESS NOTE    Ellen [name]  BJY:[id number] DOB: [birth year] DOA: 12/24/2022 PCP: Ardith Dark, MD   Brief Narrative: 82 year old with past medical history significant for seasonal allergies, anxiety, osteoarthritis, GERD, hypertension, nephrolithiasis, left buccal cheek keratoacanthoma, squamous cell skin cancer, chronic right lower extremity edema, diverticulosis, ventral hernia repair history of SBO presented complaining of abdominal pain, fatigue, nausea, constipation and decreased oral intake.  CT abdomen and pelvis show mildly dilated fluid filled loops of the small bowel diffusely with transition point at the level of the distal ileum where there is some focal thickening and stranding.  Patient has been admitted for SBO.   Assessment & Plan:   Principal Problem:   Constipation Active Problems:   Hypertension   Hyperglycemia   Leg edema   Gastroesophageal reflux disease   Volume depletion   Partial small bowel obstruction (HCC)   Polycythemia   1-partial SBO vs enteritis.  Presents with abdominal pain, distension. CT with PSBO vs enteritis.  Surgery following.  Plan for SBO protocol.  IV fluids.  NPO.  8/09 KUB: Mildly dilated loops of small bowel within the abdomen, similar to prior. On IV zosyn.   Leukocytosis; trending down.   Dehydration:  -Continue with IV fluids.    Hypertension: Will schedule IV hydralazine.    Hyperglycemia:  A1c; 5.2 normal.   GERD: IV PPI>    Polycythemia, hemoconcentration. Continue with IV fluids      Estimated body mass index is 19.57 kg/m as calculated from the following:   Height as of this encounter: 5\' 4"  (1.626 m).   Weight as of this encounter: 51.7 kg.   DVT prophylaxis: Lovenox Code Status: Full Code Family Communication: Disposition Plan:  Status is: Observation The patient will require care spanning > 2 midnights and should be moved to inpatient because: management of SBO    Consultants:   General Surgery   Procedures:    Antimicrobials:    Subjective: She has abdominal distension. Report nausea. No BM. Passing some gas.   Objective: Vitals:   12/24/22 1900 12/24/22 1943 12/25/22 0120 12/25/22 0526  BP:  (!) 140/84 (!) 171/77 (!) 162/76  Pulse: 80 86 83 84  Resp:  18 18 18   Temp:  98.2 F (36.8 C) 97.8 F (36.6 C) 98 F (36.7 C)  TempSrc:  Oral Oral Oral  SpO2: 95% 96% 94% 93%  Weight:      Height:        Intake/Output Summary (Last 24 hours) at 12/25/2022 0725 Last data filed at 12/25/2022 0600 Gross per 24 hour  Intake 2049 ml  Output 300 ml  Net 1749 ml   Filed Weights   12/24/22 1027  Weight: 51.7 kg    Examination:  General exam: Appears calm and comfortable  Respiratory system: Clear to auscultation. Respiratory effort normal. Cardiovascular system: S1 & S2 heard, RRR. Gastrointestinal system: Abdomen is distended, ventral hernia. Mild tender.  Central nervous system: Alert and oriented.  Extremities: Symmetric 5 x 5 power.    Data Reviewed: I have personally reviewed following labs and imaging studies  CBC: Recent Labs  Lab 12/24/22 1119 12/25/22 0541  WBC 22.9* 12.5*  NEUTROABS 20.3*  --   HGB 16.1* 13.9  HCT 47.6* 40.7  MCV 98.3 100.5*  PLT 308 244   Basic Metabolic Panel: Recent Labs  Lab 12/24/22 1119 12/25/22 0429  NA 137 135  K 3.8 4.2  CL 102 103  CO2 25 23  GLUCOSE 131* 124*  BUN 26* 21  CREATININE 0.67 0.67  CALCIUM 9.6 8.4*  MG 2.3  --   PHOS 4.2  --    GFR: Estimated Creatinine Clearance: 43.5 mL/min (by C-G formula based on SCr of 0.67 mg/dL). Liver Function Tests: Recent Labs  Lab 12/24/22 1119 12/25/22 0429  AST 19 14*  ALT 20 16  ALKPHOS 80 59  BILITOT 0.8 0.7  PROT 6.6 5.1*  ALBUMIN 3.6 2.8*   Recent Labs  Lab 12/24/22 1119  LIPASE 28   No results for input(s): "AMMONIA" in the last 168 hours. Coagulation Profile: No results for input(s): "INR", "PROTIME" in the last 168  hours. Cardiac Enzymes: No results for input(s): "CKTOTAL", "CKMB", "CKMBINDEX", "TROPONINI" in the last 168 hours. BNP (last 3 results) No results for input(s): "PROBNP" in the last 8760 hours. HbA1C: Recent Labs    12/25/22 0429  HGBA1C 5.2   CBG: No results for input(s): "GLUCAP" in the last 168 hours. Lipid Profile: No results for input(s): "CHOL", "HDL", "LDLCALC", "TRIG", "CHOLHDL", "LDLDIRECT" in the last 72 hours. Thyroid Function Tests: No results for input(s): "TSH", "T4TOTAL", "FREET4", "T3FREE", "THYROIDAB" in the last 72 hours. Anemia Panel: No results for input(s): "VITAMINB12", "FOLATE", "FERRITIN", "TIBC", "IRON", "RETICCTPCT" in the last 72 hours. Sepsis Labs: Recent Labs  Lab 12/24/22 1403  LATICACIDVEN 1.0    No results found for this or any previous visit (from the past 240 hour(s)).       Radiology Studies: CT ABDOMEN PELVIS W CONTRAST  Result Date: 12/24/2022 CLINICAL DATA:  Constipation. Unable to void. Increasing abdominal pain. EXAM: CT ABDOMEN AND PELVIS WITH CONTRAST TECHNIQUE: Multidetector CT imaging of the abdomen and pelvis was performed using the standard protocol following bolus administration of intravenous contrast. RADIATION DOSE REDUCTION: This exam was performed according to the departmental dose-optimization program which includes automated exposure control, adjustment of the mA and/or kV according to patient size and/or use of iterative reconstruction technique. CONTRAST:  OMNIPAQUE IOHEXOL 300 MG/ML  SOLN COMPARISON:  CT 04/25/2020 and older FINDINGS: Lower chest: Breathing motion. Dependent bandlike areas of opacity at the bases likely scar or atelectasis. No pleural effusion. Hepatobiliary: Patent portal vein. There are some benign-appearing cysts identified in the liver such as medially in the right hepatic lobe measuring 15 mm, unchanged from prior. Benign cyst no specific imaging follow-up. There is also focal fat deposition seen  in the liver adjacent to the falciform ligament segment 4. Mild intrahepatic biliary duct ectasia identified with the common duct does taper towards the pancreatic head. Gallbladder is distended. Pancreas: Dilatation of the pancreatic duct identified measuring up to 3 mm, overall mild. No discrete pancreatic mass. 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 Spleen: Normal in size without focal abnormality. Adrenals/Urinary Tract: The adrenal glands are preserved. No enhancing renal mass or collecting system dilatation. There are some tiny cysts identified in the right kidney, Bosniak 1 and 2 lesions. No specific imaging follow-up. These were seen on the previous noncontrast examination. There is brush appearance to calices bilaterally, please correlate for tubular ectasia. The ureters have normal course and caliber down to the bladder. Preserved contours of the urinary bladder. Stomach/Bowel: No oral contrast. The large bowel has significant colonic diverticulosis of the sigmoid colon. The large bowel overall is relatively decompressed diffusely. Stomach is nondilated. There are several fluid-filled loops of small bowel with diameter approaching 2.9-3 cm, slightly dilated. Surgical changes along loops of bowel in the central upper pelvis. There is a transition to some nondilated  bowel identified at the terminal ileum where there is specific fold thickening. Adjacent stranding. Please correlate for an enteritis although there is a differential including infectious, inflammatory and ischemic process. Of note there is prominent second portion duodenal diverticulum. Multiple small bowel diverticula elsewhere as well. Vascular/Lymphatic: Aortic atherosclerosis. No enlarged abdominal or pelvic lymph nodes. Reproductive: Multilobular uterus consistent with multiple fibroids. Other: Trace free fluid in the pelvis. No free intra-air identified. Musculoskeletal: Curvature of the spine. Scattered degenerative changes of the  spine and pelvis. IMPRESSION: Mildly dilated fluid-filled loops of small bowel diffusely with transition at the level of the distal ileum where there is some fold thickening and stranding. Please correlate for etiology including differential of infectious, inflammatory and ischemic process. Trace free fluid and mesenteric stranding. No free air. Colonic diverticulosis. Mild dilatation of the biliary tree but normal tapering of the common duct towards the pancreatic head. 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 Electronically Signed   By: Karen Kays M.D.   On: 12/24/2022 13:15        Scheduled Meds:  enoxaparin (LOVENOX) injection  40 mg Subcutaneous Q24H   pantoprazole (PROTONIX) IV  40 mg Intravenous Q24H   Continuous Infusions:  0.9 % NaCl with KCl 20 mEq / L 75 mL/hr at 12/24/22 1958   piperacillin-tazobactam (ZOSYN)  IV 3.375 g (12/25/22 0520)     LOS: 0 days    Time spent: 35 minutes    Burr Soffer A Nijah Tejera, MD [facility]   If 7PM-7AM, please contact night-coverage [email or web address]  12/25/2022, 7:25 AM

## 2022-12-25 NOTE — Plan of Care (Signed)
  Problem: Education: Goal: Knowledge of General Education information will improve Description: Including pain rating scale, medication(s)/side effects and non-pharmacologic comfort measures Outcome: Progressing   Problem: Health Behavior/Discharge Planning: Goal: Ability to manage health-related needs will improve Outcome: Progressing   Problem: Clinical Measurements: Goal: Ability to maintain clinical measurements within normal limits will improve Outcome: Progressing   Problem: Activity: Goal: Risk for activity intolerance will decrease Outcome: Progressing   Problem: Elimination: Goal: Will not experience complications related to bowel motility Outcome: Not Progressing

## 2022-12-25 NOTE — Progress Notes (Signed)
Pt reported emesis episode and throwing up large amount of green vomit into sink while brushing teeth.

## 2022-12-25 NOTE — Progress Notes (Signed)
Mobility Specialist - Progress Note   12/25/22 0910  Mobility  Activity Ambulated with assistance in hallway  Level of Assistance Modified independent, requires aide device or extra time  Assistive Device Other (Comment) (IV Pole)  Distance Ambulated (ft) 500 ft  Activity Response Tolerated well  Mobility Referral Yes  $Mobility charge 1 Mobility  Mobility Specialist Start Time (ACUTE ONLY) 0901  Mobility Specialist Stop Time (ACUTE ONLY) 0910  Mobility Specialist Time Calculation (min) (ACUTE ONLY) 9 min   Pt received in bed and agreeable to mobility. No complaints during session. Pt to bed after session with all needs met.    [facility]

## 2022-12-26 ENCOUNTER — Inpatient Hospital Stay (HOSPITAL_COMMUNITY): Payer: Medicare Other

## 2022-12-26 DIAGNOSIS — K56609 Unspecified intestinal obstruction, unspecified as to partial versus complete obstruction: Secondary | ICD-10-CM | POA: Diagnosis not present

## 2022-12-26 MED ORDER — POTASSIUM CHLORIDE 10 MEQ/100ML IV SOLN
10.0000 meq | INTRAVENOUS | Status: AC
  Administered 2022-12-26 (×2): 10 meq via INTRAVENOUS
  Filled 2022-12-26 (×2): qty 100

## 2022-12-26 MED ORDER — MENTHOL 3 MG MT LOZG: 1.0000 | LOZENGE | OROMUCOSAL | Status: DC | PRN

## 2022-12-26 MED ORDER — ALUM & MAG HYDROXIDE-SIMETH 200-200-20 MG/5ML PO SUSP: 30.0000 mL | Freq: Four times a day (QID) | ORAL | Status: DC | PRN

## 2022-12-26 MED ORDER — MAGIC MOUTHWASH: 15.0000 mL | Freq: Four times a day (QID) | ORAL | Status: DC | PRN

## 2022-12-26 MED ORDER — PHENOL 1.4 % MT LIQD: 2.0000 | OROMUCOSAL | Status: DC | PRN

## 2022-12-26 MED ORDER — LACTATED RINGERS IV BOLUS: 1000.0000 mL | Freq: Three times a day (TID) | INTRAVENOUS | Status: DC | PRN

## 2022-12-26 MED ORDER — BISACODYL 10 MG RE SUPP
10.0000 mg | Freq: Every day | RECTAL | Status: DC
  Administered 2022-12-26: 10 mg via RECTAL
  Filled 2022-12-26 (×3): qty 1

## 2022-12-26 NOTE — Progress Notes (Signed)
Mobility Specialist - Progress Note   12/26/22 0907  Mobility  Activity Ambulated with assistance to bathroom  Level of Assistance Modified independent, requires aide device or extra time  Assistive Device Other (Comment) (IV Pole)  Distance Ambulated (ft) 500 ft  Activity Response Tolerated well  Mobility Referral Yes  $Mobility charge 1 Mobility  Mobility Specialist Start Time (ACUTE ONLY) [id number]  Mobility Specialist Stop Time (ACUTE ONLY) 0907  Mobility Specialist Time Calculation (min) (ACUTE ONLY) 14 min   Pt received in bed and agreeable to mobility. No complaints during session. Pt to bed after session with all needs met.    [facility]

## 2022-12-26 NOTE — Plan of Care (Signed)
  Problem: Clinical Measurements: Goal: Ability to maintain clinical measurements within normal limits will improve Outcome: Progressing   Problem: Coping: Goal: Level of anxiety will decrease Outcome: Progressing   Problem: Elimination: Goal: Will not experience complications related to bowel motility Outcome: Progressing

## 2022-12-26 NOTE — Progress Notes (Signed)
       Subjective: Feels a little better today subjectively. No flatus or BM. 8-hr XR with no contrast in colon.   Objective: Vital signs in last 24 hours: Temp:  [97.5 F (36.4 C)-98.2 F (36.8 C)] 97.7 F (36.5 C) (08/10 0622) Pulse Rate:  [79-103] 85 (08/10 0622) Resp:  [16-20] 18 (08/10 0622) BP: (140-182)/(77-93) 151/81 (08/10 0622) SpO2:  [92 %-97 %] 94 % (08/10 0622) Last BM Date : 12/21/22  Intake/Output from previous day: 08/09 0701 - 08/10 0700 In: 535.6 [I.V.:423.8; IV Piggyback:111.8] Out: 550 [Emesis/NG output:550] Intake/Output this shift: Total I/O In: -  Out: 25 [Emesis/NG output:25]  PE: General: resting comfortably, NAD Neuro: alert and oriented, no focal deficits HEENT: NG in place with bilious drainage Resp: normal work of breathing Abdomen: distended but soft and minimally tender, well-healed surgical scars.     Lab Results:  Recent Labs    12/25/22 0541 12/26/22 0500  WBC 12.5* 13.6*  HGB 13.9 14.8  HCT 40.7 45.2  PLT 244 294   BMET Recent Labs    12/25/22 0429 12/26/22 0500  NA 135 140  K 4.2 3.7  CL 103 105  CO2 23 25  GLUCOSE 124* 130*  BUN 21 24*  CREATININE 0.67 0.67  CALCIUM 8.4* 9.0   PT/INR No results for input(s): "LABPROT", "INR" in the last 72 hours. CMP     Component Value Date/Time   NA 140 12/26/2022 0500   NA 143 04/07/2017 0000   K 3.7 12/26/2022 0500   CL 105 12/26/2022 0500   CO2 25 12/26/2022 0500   GLUCOSE 130 (H) 12/26/2022 0500   BUN 24 (H) 12/26/2022 0500   BUN 27 (A) 04/07/2017 0000   CREATININE 0.67 12/26/2022 0500   CREATININE 0.75 10/21/2016 0807   CALCIUM 9.0 12/26/2022 0500   PROT 5.1 (L) 12/25/2022 0429   ALBUMIN 2.8 (L) 12/25/2022 0429   AST 14 (L) 12/25/2022 0429   ALT 16 12/25/2022 0429   ALKPHOS 59 12/25/2022 0429   BILITOT 0.7 12/25/2022 0429   GFRNONAA >60 12/26/2022 0500   GFRAA >60 01/10/2015 0402   Lipase     Component Value Date/Time   LIPASE 28 12/24/2022 1119       Assessment/Plan 82 yo female with likely adhesive SBO. - Continue NG decompression, NPO - Gastrografin given yesterday. Plan to repeat XR at 24 hours. - Awaiting bowel function - Surgery will follow     LOS: 1 day    Sophronia Simas, MD [facility] General, Hepatobiliary and Pancreatic Surgery 12/26/22 10:23 AM

## 2022-12-26 NOTE — Progress Notes (Signed)
PROGRESS NOTE    Ellen [name]  WNU:[id number] DOB: 18-Apr-1941 DOA: 12/24/2022 PCP: Ardith Dark, MD   Brief Narrative: 82 year old with past medical history significant for seasonal allergies, anxiety, osteoarthritis, GERD, hypertension, nephrolithiasis, left buccal cheek keratoacanthoma, squamous cell skin cancer, chronic right lower extremity edema, diverticulosis, ventral hernia repair history of SBO presented complaining of abdominal pain, fatigue, nausea, constipation and decreased oral intake.  CT abdomen and pelvis show mildly dilated fluid filled loops of the small bowel diffusely with transition point at the level of the distal ileum where there is some focal thickening and stranding.  Patient has been admitted for SBO.   Assessment & Plan:   Principal Problem:   Constipation Active Problems:   Hypertension   Hyperglycemia   Leg edema   Gastroesophageal reflux disease   Volume depletion   Partial small bowel obstruction (HCC)   Polycythemia   1-partial SBO vs enteritis.  Presents with abdominal pain, distension. CT with PSBO vs enteritis.  Surgery following.  Plan for SBO protocol.  IV fluids.  NPO.  8/09 KUB: Mildly dilated loops of small bowel within the abdomen, similar to prior. On IV zosyn.  KUB ordered.  Had NG tube placed 8/9  Leukocytosis; trending down.   Dehydration:  -Continue with IV fluids.    Hypertension: Continue with schedule IV hydralazine.    Hyperglycemia:  A1c; 5.2 normal.   GERD: IV PPI>    Polycythemia, hemoconcentration. Continue with IV fluids      Estimated body mass index is 19.57 kg/m as calculated from the following:   Height as of this encounter: 5\' 4"  (1.626 m).   Weight as of this encounter: 51.7 kg.   DVT prophylaxis: Lovenox Code Status: Full Code Family Communication: Care discussed with patient.  Disposition Plan:  Status is: Observation The patient will require care spanning > 2 midnights and  should be moved to inpatient because: management of SBO    Consultants:  General Surgery   Procedures:    Antimicrobials:    Subjective: She is feeling a little better today. Less distended. Report passing some gas.  She had NG tube placed yesterday afternoon.   Objective: Vitals:   12/25/22 2056 12/25/22 2234 12/25/22 2342 12/26/22 0622  BP: (!) 182/92 (!) 171/93 (!) 140/82 (!) 151/81  Pulse: 90 (!) 103 92 85  Resp: 20 20 18 18   Temp: (!) 97.5 F (36.4 C)   97.7 F (36.5 C)  TempSrc:    Oral  SpO2: 93% 93% 92% 94%  Weight:      Height:        Intake/Output Summary (Last 24 hours) at 12/26/2022 1132 Last data filed at 12/26/2022 0819 Gross per 24 hour  Intake 535.59 ml  Output 575 ml  Net -39.41 ml   Filed Weights   12/24/22 1027  Weight: 51.7 kg    Examination:  General exam: NAD Respiratory system: CTA Cardiovascular system: S1, S 2 RRR Gastrointestinal system: BS decreased, distended. ventral hernia. Mild tender.  Central nervous system: Alert, conversant.  Extremities: No edema    Data Reviewed: I have personally reviewed following labs and imaging studies  CBC: Recent Labs  Lab 12/24/22 1119 12/25/22 0541 12/26/22 0500  WBC 22.9* 12.5* 13.6*  NEUTROABS 20.3*  --   --   HGB 16.1* 13.9 14.8  HCT 47.6* 40.7 45.2  MCV 98.3 100.5* 102.5*  PLT 308 244 294   Basic Metabolic Panel: Recent Labs  Lab 12/24/22 1119 12/25/22 0429 12/26/22  0500  NA 137 135 140  K 3.8 4.2 3.7  CL 102 103 105  CO2 25 23 25   GLUCOSE 131* 124* 130*  BUN 26* 21 24*  CREATININE 0.67 0.67 0.67  CALCIUM 9.6 8.4* 9.0  MG 2.3  --   --   PHOS 4.2  --   --    GFR: Estimated Creatinine Clearance: 43.5 mL/min (by C-G formula based on SCr of 0.67 mg/dL). Liver Function Tests: Recent Labs  Lab 12/24/22 1119 12/25/22 0429  AST 19 14*  ALT 20 16  ALKPHOS 80 59  BILITOT 0.8 0.7  PROT 6.6 5.1*  ALBUMIN 3.6 2.8*   Recent Labs  Lab 12/24/22 1119  LIPASE 28    No results for input(s): "AMMONIA" in the last 168 hours. Coagulation Profile: No results for input(s): "INR", "PROTIME" in the last 168 hours. Cardiac Enzymes: No results for input(s): "CKTOTAL", "CKMB", "CKMBINDEX", "TROPONINI" in the last 168 hours. BNP (last 3 results) No results for input(s): "PROBNP" in the last 8760 hours. HbA1C: Recent Labs    12/25/22 0429  HGBA1C 5.2   CBG: No results for input(s): "GLUCAP" in the last 168 hours. Lipid Profile: No results for input(s): "CHOL", "HDL", "LDLCALC", "TRIG", "CHOLHDL", "LDLDIRECT" in the last 72 hours. Thyroid Function Tests: No results for input(s): "TSH", "T4TOTAL", "FREET4", "T3FREE", "THYROIDAB" in the last 72 hours. Anemia Panel: No results for input(s): "VITAMINB12", "FOLATE", "FERRITIN", "TIBC", "IRON", "RETICCTPCT" in the last 72 hours. Sepsis Labs: Recent Labs  Lab 12/24/22 1403  LATICACIDVEN 1.0    No results found for this or any previous visit (from the past 240 hour(s)).       Radiology Studies: DG Abd Portable 1V-Small Bowel Obstruction Protocol-initial, 8 hr delay  Result Date: 12/25/2022 CLINICAL DATA:  8 hour follow-up small-bowel film EXAM: PORTABLE ABDOMEN - 1 VIEW COMPARISON:  Film from earlier in the same day. FINDINGS: Gastric catheter is noted within the stomach. Contrast administered lies within the dilated loops of small bowel. No definitive colonic contrast is seen. 24 hour follow-up film is recommended. IMPRESSION: Dilated loops of small bowel with contrast within. No definitive colonic contrast is seen. 24 hour follow-up film is recommended. Electronically Signed   By: Alcide Clever M.D.   On: 12/25/2022 20:16   DG Abd 1 View  Result Date: 12/25/2022 CLINICAL DATA:  [id number] SBO (small bowel obstruction) (HCC) [id number] EXAM: ABDOMEN - 1 VIEW COMPARISON:  12/24/2022 FINDINGS: Mildly dilated loops of small bowel within the abdomen, similar to prior. No gross free intraperitoneal air. Degenerative  changes of the lumbar spine and hips. IMPRESSION: Mildly dilated loops of small bowel within the abdomen, similar to prior. Electronically Signed   By: Duanne Guess D.O.   On: 12/25/2022 11:41   CT ABDOMEN PELVIS W CONTRAST  Result Date: 12/24/2022 CLINICAL DATA:  Constipation. Unable to void. Increasing abdominal pain. EXAM: CT ABDOMEN AND PELVIS WITH CONTRAST TECHNIQUE: Multidetector CT imaging of the abdomen and pelvis was performed using the standard protocol following bolus administration of intravenous contrast. RADIATION DOSE REDUCTION: This exam was performed according to the departmental dose-optimization program which includes automated exposure control, adjustment of the mA and/or kV according to patient size and/or use of iterative reconstruction technique. CONTRAST:  OMNIPAQUE IOHEXOL 300 MG/ML  SOLN COMPARISON:  CT 04/25/2020 and older FINDINGS: Lower chest: Breathing motion. Dependent bandlike areas of opacity at the bases likely scar or atelectasis. No pleural effusion. Hepatobiliary: Patent portal vein. There are some benign-appearing cysts identified  in the liver such as medially in the right hepatic lobe measuring 15 mm, unchanged from prior. Benign cyst no specific imaging follow-up. There is also focal fat deposition seen in the liver adjacent to the falciform ligament segment 4. Mild intrahepatic biliary duct ectasia identified with the common duct does taper towards the pancreatic head. Gallbladder is distended. Pancreas: Dilatation of the pancreatic duct identified measuring up to 3 mm, overall mild. No discrete pancreatic mass. 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 Spleen: Normal in size without focal abnormality. Adrenals/Urinary Tract: The adrenal glands are preserved. No enhancing renal mass or collecting system dilatation. There are some tiny cysts identified in the right kidney, Bosniak 1 and 2 lesions. No specific imaging follow-up. These were seen on the previous  noncontrast examination. There is brush appearance to calices bilaterally, please correlate for tubular ectasia. The ureters have normal course and caliber down to the bladder. Preserved contours of the urinary bladder. Stomach/Bowel: No oral contrast. The large bowel has significant colonic diverticulosis of the sigmoid colon. The large bowel overall is relatively decompressed diffusely. Stomach is nondilated. There are several fluid-filled loops of small bowel with diameter approaching 2.9-3 cm, slightly dilated. Surgical changes along loops of bowel in the central upper pelvis. There is a transition to some nondilated bowel identified at the terminal ileum where there is specific fold thickening. Adjacent stranding. Please correlate for an enteritis although there is a differential including infectious, inflammatory and ischemic process. Of note there is prominent second portion duodenal diverticulum. Multiple small bowel diverticula elsewhere as well. Vascular/Lymphatic: Aortic atherosclerosis. No enlarged abdominal or pelvic lymph nodes. Reproductive: Multilobular uterus consistent with multiple fibroids. Other: Trace free fluid in the pelvis. No free intra-air identified. Musculoskeletal: Curvature of the spine. Scattered degenerative changes of the spine and pelvis. IMPRESSION: Mildly dilated fluid-filled loops of small bowel diffusely with transition at the level of the distal ileum where there is some fold thickening and stranding. Please correlate for etiology including differential of infectious, inflammatory and ischemic process. Trace free fluid and mesenteric stranding. No free air. Colonic diverticulosis. Mild dilatation of the biliary tree but normal tapering of the common duct towards the pancreatic head. 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 Electronically Signed   By: Karen Kays M.D.   On: 12/24/2022 13:15        Scheduled Meds:  bisacodyl  10 mg Rectal  Daily   enoxaparin (LOVENOX) injection  40 mg Subcutaneous Q24H   hydrALAZINE  10 mg Intravenous TID   pantoprazole (PROTONIX) IV  40 mg Intravenous Q24H   Continuous Infusions:  lactated ringers     lactated ringers 75 mL/hr at 12/25/22 2059   piperacillin-tazobactam (ZOSYN)  IV 3.375 g (12/26/22 0525)     LOS: 1 day    Time spent: 35 minutes    Pippa Hanif A Orenthal Debski, MD [facility]   If 7PM-7AM, please contact night-coverage [email or web address]  12/26/2022, 11:32 AM

## 2022-12-26 NOTE — Plan of Care (Signed)

## 2022-12-27 DIAGNOSIS — K56609 Unspecified intestinal obstruction, unspecified as to partial versus complete obstruction: Secondary | ICD-10-CM | POA: Diagnosis not present

## 2022-12-27 DIAGNOSIS — E876 Hypokalemia: Secondary | ICD-10-CM | POA: Insufficient documentation

## 2022-12-27 LAB — CBC
HCT: 47.8 % — ABNORMAL HIGH (ref 36.0–46.0)
Hemoglobin: 15.6 g/dL — ABNORMAL HIGH (ref 12.0–15.0)
MCH: 33.2 pg (ref 26.0–34.0)
MCHC: 32.6 g/dL (ref 30.0–36.0)
MCV: 101.7 fL — ABNORMAL HIGH (ref 80.0–100.0)
Platelets: 301 10*3/uL (ref 150–400)
RBC: 4.7 MIL/uL (ref 3.87–5.11)
RDW: 12.8 % (ref 11.5–15.5)
WBC: 12.5 10*3/uL — ABNORMAL HIGH (ref 4.0–10.5)
nRBC: 0 % (ref 0.0–0.2)

## 2022-12-27 LAB — BASIC METABOLIC PANEL WITH GFR
Anion gap: 14 (ref 5–15)
BUN: 25 mg/dL — ABNORMAL HIGH (ref 8–23)
CO2: 22 mmol/L (ref 22–32)
Calcium: 8.9 mg/dL (ref 8.9–10.3)
Chloride: 102 mmol/L (ref 98–111)
Creatinine, Ser: 0.54 mg/dL (ref 0.44–1.00)
GFR, Estimated: >60 mL/min (ref >60)
Glucose, Bld: 102 mg/dL — ABNORMAL HIGH (ref 70–99)
Potassium: 3.3 mmol/L — ABNORMAL LOW (ref 3.5–5.1)
Sodium: 138 mmol/L (ref 135–145)

## 2022-12-27 LAB — MAGNESIUM: Magnesium: 2.3 mg/dL (ref 1.7–2.4)

## 2022-12-27 MED ORDER — SODIUM CHLORIDE 0.9% FLUSH: 3.0000 mL | Freq: Two times a day (BID) | INTRAVENOUS | Status: DC

## 2022-12-27 MED ORDER — LACTATED RINGERS IV BOLUS: 1000.0000 mL | Freq: Three times a day (TID) | INTRAVENOUS | Status: DC | PRN

## 2022-12-27 MED ORDER — PROCHLORPERAZINE EDISYLATE 10 MG/2ML IJ SOLN: 5.0000 mg | INTRAMUSCULAR | Status: DC | PRN

## 2022-12-27 MED ORDER — LOSARTAN POTASSIUM 50 MG PO TABS
50.0000 mg | ORAL_TABLET | Freq: Every day | ORAL | Status: DC
  Administered 2022-12-27 – 2022-12-28 (×2): 50 mg via ORAL
  Filled 2022-12-27 (×2): qty 1

## 2022-12-27 MED ORDER — SODIUM CHLORIDE 0.9% FLUSH: 3.0000 mL | INTRAVENOUS | Status: DC | PRN

## 2022-12-27 MED ORDER — SODIUM CHLORIDE 0.9 % IV SOLN: 250.0000 mL | INTRAVENOUS | Status: DC | PRN

## 2022-12-27 MED ORDER — PANTOPRAZOLE SODIUM 40 MG PO TBEC
40.0000 mg | DELAYED_RELEASE_TABLET | Freq: Every day | ORAL | Status: DC
  Administered 2022-12-27 – 2022-12-28 (×2): 40 mg via ORAL
  Filled 2022-12-27 (×2): qty 1

## 2022-12-27 MED ORDER — METHOCARBAMOL 1000 MG/10ML IJ SOLN: 1000.0000 mg | Freq: Four times a day (QID) | INTRAVENOUS | Status: DC | PRN

## 2022-12-27 MED ORDER — NAPHAZOLINE-GLYCERIN 0.012-0.25 % OP SOLN: 1.0000 [drp] | Freq: Four times a day (QID) | OPHTHALMIC | Status: DC | PRN

## 2022-12-27 MED ORDER — SALINE SPRAY 0.65 % NA SOLN: 1.0000 | Freq: Four times a day (QID) | NASAL | Status: DC | PRN

## 2022-12-27 MED ORDER — CEPHALEXIN 500 MG PO CAPS
500.0000 mg | ORAL_CAPSULE | Freq: Three times a day (TID) | ORAL | Status: DC
  Administered 2022-12-27 – 2022-12-28 (×4): 500 mg via ORAL
  Filled 2022-12-27 (×4): qty 1

## 2022-12-27 MED ORDER — CALCIUM POLYCARBOPHIL 625 MG PO TABS
625.0000 mg | ORAL_TABLET | Freq: Two times a day (BID) | ORAL | Status: DC
  Administered 2022-12-27 – 2022-12-28 (×3): 625 mg via ORAL
  Filled 2022-12-27 (×3): qty 1

## 2022-12-27 MED ORDER — HYDRALAZINE HCL 25 MG PO TABS: 25.0000 mg | ORAL_TABLET | Freq: Four times a day (QID) | ORAL | Status: DC | PRN

## 2022-12-27 MED ORDER — POTASSIUM CHLORIDE 10 MEQ/100ML IV SOLN
10.0000 meq | INTRAVENOUS | Status: AC
  Administered 2022-12-27 (×3): 10 meq via INTRAVENOUS
  Filled 2022-12-27 (×3): qty 100

## 2022-12-27 MED ORDER — METRONIDAZOLE 500 MG PO TABS
500.0000 mg | ORAL_TABLET | Freq: Two times a day (BID) | ORAL | Status: DC
  Administered 2022-12-27 – 2022-12-28 (×3): 500 mg via ORAL
  Filled 2022-12-27 (×3): qty 1

## 2022-12-27 NOTE — Plan of Care (Signed)
Unable to maintain IV access today, all meds have been transitioned to PO.  Ellen [name] has had multiple bowel movements since 8/10 and no longer has an NGT.  Diet advanced to soft with thin liquids, no nausea or vomiting with PO intake.

## 2022-12-27 NOTE — Progress Notes (Signed)
12/27/2022  Ellen [name] [id number] [birth year]  CARE TEAM: PCP: Ardith Dark, MD  Outpatient Care Team: Patient Care Team: Ardith Dark, MD as PCP - General (Family Medicine) Spero Curb, Judye Bos, PA-C as Physician Assistant (Dermatology) Erroll Luna, [facility] (Inactive) as Pharmacist (Pharmacist)  Inpatient Treatment Team: Treatment Team:  Alba Cory, MD [facility], Md, MD Lilyan Gilford, MD Merwyn Katos, NT Wright, [place] E, LPN Clydia Llano, RN Laural Benes [facility] M   Problem List:   Principal Problem:   Constipation Active Problems:   Hypertension   Hyperglycemia   Leg edema   Gastroesophageal reflux disease   Volume depletion   Partial small bowel obstruction (HCC)   Polycythemia   * No surgery found *      Assessment [facility] Stay = 2 days)      Probable enteritis versus partial small bowel obstruction resolving rapidly    Plan:  -With bowel movements and contrast in the colon, I removed the NG tube. -Dysphagia 1/full liquid diet advance to soft diet as tolerated.  Feels worse, go more slowly.  Hopefully not too likely -Back off on IV fluid regular with bolus backup. -Hypokalemia correcting.  Magnesium okay. -VTE prophylaxis- SCDs, etc -mobilize as tolerated to help recovery -Disposition: Per primary service.  Hopefully will rapidly improve and can go home tomorrow on a soft diet.       I reviewed nursing notes, hospitalist notes, last 24 h vitals and pain scores, last 48 h intake and output, last 24 h labs and trends, and last 24 h imaging results.  I have reviewed this patient's available data, including medical history, events of note, test results, etc as part of my evaluation.   A significant portion of that time was spent in counseling. Care during the described time interval was provided by me.  This care required moderate level of medical decision making.  12/27/2022    Subjective: (Chief complaint)  Patient  feels much better overall.  Passing gas and flatus.  Had numerous bowel movements this morning  No more nausea.  Hungry.  Wants to go home.  Objective:  Vital signs:  Vitals:   12/26/22 1337 12/26/22 1841 12/26/22 2106 12/27/22 0447  BP: (!) 171/88 (!) 154/76 (!) 175/83 (!) 188/97  Pulse: 88 97 91 100  Resp:   20 20  Temp: 97.6 F (36.4 C)  98.2 F (36.8 C) 97.8 F (36.6 C)  TempSrc: Oral  Oral Oral  SpO2: 96%  96% 96%  Weight:      Height:        Last BM Date : 12/21/22  Intake/Output   Yesterday:  08/10 0701 - 08/11 0700 In: 3025.9 [I.V.:2587.9; IV Piggyback:438.1] Out: 1050 [Urine:250; Emesis/NG output:800] This shift:  No intake/output data recorded.  Bowel function:  Flatus: YES  BM:  YES  Drain: Nasogastric tube: Bilious   Physical Exam:  General: Pt awake/alert in no acute distress Eyes: PERRL, normal EOM.  Sclera clear.  No icterus Neuro: CN II-XII intact w/o focal sensory/motor deficits. Lymph: No head/neck/groin lymphadenopathy Psych:  No delerium/psychosis/paranoia.  Oriented x 4 HENT: Normocephalic, Mucus membranes moist.  No thrush Neck: Supple, No tracheal deviation.  No obvious thyromegaly Chest: No pain to chest wall compression.  Good respiratory excursion.  No audible wheezing CV:  Pulses intact.  Regular rhythm.  No major extremity edema MS: Normal AROM mjr joints.  No obvious deformity  Abdomen: Soft.  Nondistended.  Mildly tender at incisions only.  No evidence of peritonitis.  No incarcerated hernias.  Ext:   No deformity.  No mjr edema.  No cyanosis Skin: No petechiae / purpurea.  No major sores.  Warm and dry    Results:   Cultures: No results found for this or any previous visit (from the past 720 hour(s)).  Labs: Results for orders placed or performed during the hospital encounter of 12/24/22 (from the past 48 hour(s))  CBC     Status: Abnormal   Collection Time: 12/26/22  5:00 AM  Result Value Ref Range   WBC 13.6  (H) 4.0 - 10.5 K/uL   RBC 4.41 3.87 - 5.11 MIL/uL   Hemoglobin 14.8 12.0 - 15.0 g/dL   HCT 16.1 09.6 - 04.5 %   MCV 102.5 (H) 80.0 - 100.0 fL   MCH 33.6 26.0 - 34.0 pg   MCHC 32.7 30.0 - 36.0 g/dL   RDW 40.9 81.1 - 91.4 %   Platelets 294 150 - 400 K/uL   nRBC 0.0 0.0 - 0.2 %    Comment: Performed at [facility], [address], [place], [place] [address]  Basic metabolic panel     Status: Abnormal   Collection Time: 12/26/22  5:00 AM  Result Value Ref Range   Sodium 140 135 - 145 mmol/L   Potassium 3.7 3.5 - 5.1 mmol/L   Chloride 105 98 - 111 mmol/L   CO2 25 22 - 32 mmol/L   Glucose, Bld 130 (H) 70 - 99 mg/dL    Comment: Glucose reference range applies only to samples taken after fasting for at least 8 hours.   BUN 24 (H) 8 - 23 mg/dL   Creatinine, Ser 6.21 0.44 - 1.00 mg/dL   Calcium 9.0 8.9 - 30.8 mg/dL   GFR, Estimated >65 >78 mL/min    Comment: (NOTE) Calculated using the CKD-EPI Creatinine Equation (2021)    Anion gap 10 5 - 15    Comment: Performed at [facility], [address] [place], [place] [address]  Magnesium     Status: None   Collection Time: 12/27/22  4:45 AM  Result Value Ref Range   Magnesium 2.3 1.7 - 2.4 mg/dL    Comment: Performed at [facility], [address], [place], [place] [address]  Basic metabolic panel     Status: Abnormal   Collection Time: 12/27/22  4:45 AM  Result Value Ref Range   Sodium 138 135 - 145 mmol/L   Potassium 3.3 (L) 3.5 - 5.1 mmol/L   Chloride 102 98 - 111 mmol/L   CO2 22 22 - 32 mmol/L   Glucose, Bld 102 (H) 70 - 99 mg/dL    Comment: Glucose reference range applies only to samples taken after fasting for at least 8 hours.   BUN 25 (H) 8 - 23 mg/dL   Creatinine, Ser 1.32 0.44 - 1.00 mg/dL   Calcium 8.9 8.9 - 44.0 mg/dL   GFR, Estimated >10 >27 mL/min    Comment: (NOTE) Calculated using the CKD-EPI Creatinine Equation (2021)    Anion gap 14 5 - 15    Comment:  Performed at [facility], [address] [place], [place] [address]  CBC     Status: Abnormal   Collection Time: 12/27/22  4:45 AM  Result Value Ref Range   WBC 12.5 (H) 4.0 - 10.5 K/uL   RBC 4.70 3.87 - 5.11 MIL/uL   Hemoglobin 15.6 (H) 12.0 - 15.0 g/dL  HCT 47.8 (H) 36.0 - 46.0 %   MCV 101.7 (H) 80.0 - 100.0 fL   MCH 33.2 26.0 - 34.0 pg   MCHC 32.6 30.0 - 36.0 g/dL   RDW 09.6 04.5 - 40.9 %   Platelets 301 150 - 400 K/uL   nRBC 0.0 0.0 - 0.2 %    Comment: Performed at [facility], [address] [place], [place] [address]    Imaging / Studies: DG Abd 1 View  Result Date: 12/26/2022 CLINICAL DATA:  [id number] SBO (small bowel obstruction) (HCC) [id number] EXAM: ABDOMEN - 1 VIEW COMPARISON:  the previous day's study FINDINGS: Gastrostomy tube remains in decompressed stomach. Small bowel appears decompressed. Contrast in nondilated bowel loops including colon. IMPRESSION: Nonobstructive bowel gas pattern. Electronically Signed   By: Corlis Leak M.D.   On: 12/26/2022 11:37   DG Abd Portable 1V-Small Bowel Obstruction Protocol-initial, 8 hr delay  Result Date: 12/25/2022 CLINICAL DATA:  8 hour follow-up small-bowel film EXAM: PORTABLE ABDOMEN - 1 VIEW COMPARISON:  Film from earlier in the same day. FINDINGS: Gastric catheter is noted within the stomach. Contrast administered lies within the dilated loops of small bowel. No definitive colonic contrast is seen. 24 hour follow-up film is recommended. IMPRESSION: Dilated loops of small bowel with contrast within. No definitive colonic contrast is seen. 24 hour follow-up film is recommended. Electronically Signed   By: Alcide Clever M.D.   On: 12/25/2022 20:16    Medications / Allergies: per chart  Antibiotics: Anti-infectives (From admission, onward)    Start     Dose/Rate Route Frequency Ordered Stop   12/24/22 2200  piperacillin-tazobactam (ZOSYN) IVPB 3.375 g        3.375 g 12.5 mL/hr over 240 Minutes  Intravenous Every 8 hours 12/24/22 1549     12/24/22 1600  piperacillin-tazobactam (ZOSYN) IVPB 3.375 g        3.375 g 100 mL/hr over 30 Minutes Intravenous  Once 12/24/22 1548 12/24/22 1632   12/24/22 1545  piperacillin-tazobactam (ZOSYN) IVPB 3.375 g  Status:  Discontinued        3.375 g 100 mL/hr over 30 Minutes Intravenous Every 8 hours 12/24/22 1540 12/24/22 1548         Note: Portions of this report may have been transcribed using voice recognition software. Every effort was made to ensure accuracy; however, inadvertent computerized transcription errors may be present.   Any transcriptional errors that result from this process are unintentional.    Ardeth Sportsman, MD, FACS, MASCRS Esophageal, Gastrointestinal & Colorectal Surgery Robotic and Minimally Invasive Surgery  [facility] [address], [address] [place], [place] [address] [phone] Fax [phone] Main  CONTACT INFORMATION: Weekday (9AM-5PM): Call [facility] main office at [phone] Weeknight (5PM-9AM) or Weekend/Holiday: Check EPIC "Web Links" tab & use "AMION" (password " [email or web address]") for General Surgery [facility] coverage  Please, DO NOT use SecureChat  (it is not reliable communication to reach operating surgeons & will lead to a delay in care).   Epic staff messaging available for outptient concerns needing 1-2 business day response.      12/27/2022  8:15 AM

## 2022-12-27 NOTE — Progress Notes (Signed)
PROGRESS NOTE    Ellen [name]  UJW:[id number] DOB: 18-Aug-1940 DOA: 12/24/2022 PCP: Ellen Dark, MD   Brief Narrative: 82 year old with past medical history significant for seasonal allergies, anxiety, osteoarthritis, GERD, hypertension, nephrolithiasis, left buccal cheek keratoacanthoma, squamous cell skin cancer, chronic right lower extremity edema, diverticulosis, ventral hernia repair history of SBO presented complaining of abdominal pain, fatigue, nausea, constipation and decreased oral intake.  CT abdomen and pelvis show mildly dilated fluid filled loops of the small bowel diffusely with transition point at the level of the distal ileum where there is some focal thickening and stranding.  Patient has been admitted for SBO.   Assessment & Plan:   Principal Problem:   Constipation Active Problems:   Hypertension   Hyperglycemia   Leg edema   Gastroesophageal reflux disease   Volume depletion   Partial small bowel obstruction (HCC)   Polycythemia   Hypokalemia   1-partial SBO vs enteritis.  Presents with abdominal pain, distension. CT with PSBO vs enteritis.  Surgery following.  Underwent SBO protocol.  8/09 KUB: Mildly dilated loops of small bowel within the abdomen, similar to prior. On IV zosyn.  NG tube removed 8/11. Had multiples BM since yesterday. Sh feels better.  Started on Soft diet. She will try clears and advance as tolerated.   Hypokalemia;  Replete IV>   Leukocytosis; trending down.   Dehydration:  -Treated  with IV fluids.    Hypertension: Continue with schedule IV hydralazine.  Resume cozaar.   Hyperglycemia:  A1c; 5.2 normal.   GERD: IV PPI>    Polycythemia, hemoconcentration. Treated  with IV fluids      Estimated body mass index is 19.57 kg/m as calculated from the following:   Height as of this encounter: 5\' 4"  (1.626 m).   Weight as of this encounter: 51.7 kg.   DVT prophylaxis: Lovenox Code Status: Full Code Family  Communication: Care discussed with patient.  Disposition Plan:  Status is: Observation The patient will require care spanning > 2 midnights and should be moved to inpatient because: management of SBO    Consultants:  General Surgery   Procedures:    Antimicrobials:    Subjective: She is feeling better. She has been having multiples bowel movement since yesterday.   Objective: Vitals:   12/26/22 1337 12/26/22 1841 12/26/22 2106 12/27/22 0447  BP: (!) 171/88 (!) 154/76 (!) 175/83 (!) 188/97  Pulse: 88 97 91 100  Resp:   20 20  Temp: 97.6 F (36.4 C)  98.2 F (36.8 C) 97.8 F (36.6 C)  TempSrc: Oral  Oral Oral  SpO2: 96%  96% 96%  Weight:      Height:        Intake/Output Summary (Last 24 hours) at 12/27/2022 1139 Last data filed at 12/27/2022 0447 Gross per 24 hour  Intake 3025.94 ml  Output 775 ml  Net 2250.94 ml   Filed Weights   12/24/22 1027  Weight: 51.7 kg    Examination:  General exam: NAD Respiratory system: CTA Cardiovascular system: S 1, S 2 RRR Gastrointestinal system: BS present, soft, ventral hernia. No tender Central nervous system: Alert, conversant.  Extremities: No edema    Data Reviewed: I have personally reviewed following labs and imaging studies  CBC: Recent Labs  Lab 12/24/22 1119 12/25/22 0541 12/26/22 0500 12/27/22 0445  WBC 22.9* 12.5* 13.6* 12.5*  NEUTROABS 20.3*  --   --   --   HGB 16.1* 13.9 14.8 15.6*  HCT 47.6* 40.7  45.2 47.8*  MCV 98.3 100.5* 102.5* 101.7*  PLT 308 244 294 301   Basic Metabolic Panel: Recent Labs  Lab 12/24/22 1119 12/25/22 0429 12/26/22 0500 12/27/22 0445  NA 137 135 140 138  K 3.8 4.2 3.7 3.3*  CL 102 103 105 102  CO2 25 23 25 22   GLUCOSE 131* 124* 130* 102*  BUN 26* 21 24* 25*  CREATININE 0.67 0.67 0.67 0.54  CALCIUM 9.6 8.4* 9.0 8.9  MG 2.3  --   --  2.3  PHOS 4.2  --   --   --    GFR: Estimated Creatinine Clearance: 43.5 mL/min (by C-G formula based on SCr of 0.54  mg/dL). Liver Function Tests: Recent Labs  Lab 12/24/22 1119 12/25/22 0429  AST 19 14*  ALT 20 16  ALKPHOS 80 59  BILITOT 0.8 0.7  PROT 6.6 5.1*  ALBUMIN 3.6 2.8*   Recent Labs  Lab 12/24/22 1119  LIPASE 28   No results for input(s): "AMMONIA" in the last 168 hours. Coagulation Profile: No results for input(s): "INR", "PROTIME" in the last 168 hours. Cardiac Enzymes: No results for input(s): "CKTOTAL", "CKMB", "CKMBINDEX", "TROPONINI" in the last 168 hours. BNP (last 3 results) No results for input(s): "PROBNP" in the last 8760 hours. HbA1C: Recent Labs    12/25/22 0429  HGBA1C 5.2   CBG: No results for input(s): "GLUCAP" in the last 168 hours. Lipid Profile: No results for input(s): "CHOL", "HDL", "LDLCALC", "TRIG", "CHOLHDL", "LDLDIRECT" in the last 72 hours. Thyroid Function Tests: No results for input(s): "TSH", "T4TOTAL", "FREET4", "T3FREE", "THYROIDAB" in the last 72 hours. Anemia Panel: No results for input(s): "VITAMINB12", "FOLATE", "FERRITIN", "TIBC", "IRON", "RETICCTPCT" in the last 72 hours. Sepsis Labs: Recent Labs  Lab 12/24/22 1403  LATICACIDVEN 1.0    No results found for this or any previous visit (from the past 240 hour(s)).       Radiology Studies: DG Abd 1 View  Result Date: 12/26/2022 CLINICAL DATA:  [id number] SBO (small bowel obstruction) (HCC) [id number] EXAM: ABDOMEN - 1 VIEW COMPARISON:  the previous day's study FINDINGS: Gastrostomy tube remains in decompressed stomach. Small bowel appears decompressed. Contrast in nondilated bowel loops including colon. IMPRESSION: Nonobstructive bowel gas pattern. Electronically Signed   By: Corlis Leak M.D.   On: 12/26/2022 11:37   DG Abd Portable 1V-Small Bowel Obstruction Protocol-initial, 8 hr delay  Result Date: 12/25/2022 CLINICAL DATA:  8 hour follow-up small-bowel film EXAM: PORTABLE ABDOMEN - 1 VIEW COMPARISON:  Film from earlier in the same day. FINDINGS: Gastric catheter is noted within the  stomach. Contrast administered lies within the dilated loops of small bowel. No definitive colonic contrast is seen. 24 hour follow-up film is recommended. IMPRESSION: Dilated loops of small bowel with contrast within. No definitive colonic contrast is seen. 24 hour follow-up film is recommended. Electronically Signed   By: Alcide Clever M.D.   On: 12/25/2022 20:16        Scheduled Meds:  bisacodyl  10 mg Rectal Daily   enoxaparin (LOVENOX) injection  40 mg Subcutaneous Q24H   hydrALAZINE  10 mg Intravenous TID   losartan  50 mg Oral Daily   pantoprazole (PROTONIX) IV  40 mg Intravenous Q24H   polycarbophil  625 mg Oral BID   sodium chloride flush  3 mL Intravenous Q12H   Continuous Infusions:  sodium chloride     piperacillin-tazobactam (ZOSYN)  IV 3.375 g (12/27/22 0620)   potassium chloride 10 mEq (12/27/22 1037)  LOS: 2 days    Time spent: 35 minutes    Ellen Fiddler A Arbie Blankley, MD [facility]   If 7PM-7AM, please contact night-coverage [email or web address]  12/27/2022, 11:39 AM

## 2022-12-27 NOTE — Progress Notes (Signed)
There was consult for placement PIV access. Assessed both arm and no suitable veins by USGPIV both arm. Patient want to take needle out when stick the needle in due to pain. Patient requested to change medication from IV medication for po medications. Informed patient's RN regarding this matter and she will communicate with MD. Lahoma Rocker RN

## 2022-12-27 NOTE — Plan of Care (Signed)

## 2022-12-27 NOTE — Progress Notes (Signed)
Mobility Specialist - Progress Note   12/27/22 1035  Mobility  Activity Ambulated independently in hallway  Level of Assistance Independent  Assistive Device None  Distance Ambulated (ft) 500 ft  Activity Response Tolerated well  Mobility Referral Yes  $Mobility charge 1 Mobility  Mobility Specialist Start Time (ACUTE ONLY) 1016  Mobility Specialist Stop Time (ACUTE ONLY) 1032  Mobility Specialist Time Calculation (min) (ACUTE ONLY) 16 min   Pt received in bed and agreeable to mobility. No complaints during session. Pt to bed after session with all needs met.   [facility]

## 2022-12-28 DIAGNOSIS — K529 Noninfective gastroenteritis and colitis, unspecified: Secondary | ICD-10-CM

## 2022-12-28 MED ORDER — CEPHALEXIN 500 MG PO CAPS: 500.0000 mg | ORAL_CAPSULE | Freq: Three times a day (TID) | ORAL | 0 refills | Status: AC

## 2022-12-28 MED ORDER — CALCIUM POLYCARBOPHIL 625 MG PO TABS: 625.0000 mg | ORAL_TABLET | Freq: Two times a day (BID) | ORAL | 0 refills | Status: DC

## 2022-12-28 MED ORDER — POTASSIUM CHLORIDE CRYS ER 20 MEQ PO TBCR
40.0000 meq | EXTENDED_RELEASE_TABLET | Freq: Once | ORAL | Status: AC
  Administered 2022-12-28: 40 meq via ORAL
  Filled 2022-12-28: qty 2

## 2022-12-28 MED ORDER — METRONIDAZOLE 500 MG PO TABS: 500.0000 mg | ORAL_TABLET | Freq: Two times a day (BID) | ORAL | 0 refills | Status: AC

## 2022-12-28 MED ORDER — POTASSIUM CHLORIDE CRYS ER 20 MEQ PO TBCR: 20.0000 meq | EXTENDED_RELEASE_TABLET | Freq: Every day | ORAL | 0 refills | Status: DC

## 2022-12-28 NOTE — TOC CM/SW Note (Signed)
Transition of Care [facility]) - Inpatient Brief Assessment  Patient Details  Name: Ellen [name] MRN: [id number] Date of Birth: [birth year]  Transition of Care [facility]) CM/SW Contact:    Ewing Schlein, LCSW Phone Number: 12/28/2022, 10:29 AM  Clinical Narrative: Screening completed. Patient denied needing resources for her husband as she is discharging home today.  Transition of Care Asessment: Insurance and Status: Insurance coverage has been reviewed Patient has primary care physician: Yes Home environment has been reviewed: yes Prior level of function:: independent Prior/Current Home Services: No current home services Social Determinants of Health Reivew: SDOH reviewed no interventions necessary Readmission risk has been reviewed: Yes Transition of care needs: no transition of care needs at this time

## 2022-12-28 NOTE — Discharge Summary (Incomplete)
Physician Discharge Summary   Patient: Ellen [name] MRN: [id number] DOB: [birth year]  Admit date:     12/24/2022  Discharge date: 12/28/22  Discharge Physician: Alba Cory   PCP: Ardith Dark, MD   Recommendations at discharge:  {Tip this will not be part of the note when signed- Example include specific recommendations for outpatient follow-up, pending tests to follow-up on. (Optional):26781}  ***  Discharge Diagnoses: Principal Problem:   Constipation Active Problems:   Hypertension   Hyperglycemia   Leg edema   Gastroesophageal reflux disease   Volume depletion   Partial small bowel obstruction (HCC)   Polycythemia   Hypokalemia  Resolved Problems:   * No resolved hospital problems. [facility] Course: No notes on file  Assessment and Plan: No notes have been filed under this hospital service. Service: Hospitalist     {Tip this will not be part of the note when signed Body mass index is 19.57 kg/m. , ,  (Optional):26781}  {(NOTE) Pain control PDMP Statment (Optional):26782} Consultants: *** Procedures performed: ***  Disposition: {Plan; Disposition:26390} Diet recommendation:  Discharge Diet Orders (From admission, onward)     Start     Ordered   12/28/22 0000  Diet - low sodium heart healthy        12/28/22 1038           {Diet_Plan:26776} DISCHARGE MEDICATION: Allergies as of 12/28/2022       Reactions   Lactose Intolerance (gi) Diarrhea   Sulfa Antibiotics Nausea And Vomiting, Swelling        Medication List     TAKE these medications    acetaminophen 325 MG tablet Commonly known as: TYLENOL Take 650 mg by mouth as needed for moderate pain.   cephALEXin 500 MG capsule Commonly known as: KEFLEX Take 1 capsule (500 mg total) by mouth every 8 (eight) hours for 3 days.   EYE DROPS OP Place 2 drops into both eyes daily. Thera Tears   hydrochlorothiazide 12.5 MG capsule Commonly known as: MICROZIDE TAKE 1  CAPSULE(12.5 MG) BY MOUTH DAILY   ibuprofen 200 MG tablet Commonly known as: ADVIL Take 200 mg by mouth as needed for moderate pain.   losartan 50 MG tablet Commonly known as: COZAAR TAKE 1 TABLET(50 MG) BY MOUTH DAILY   metroNIDAZOLE 500 MG tablet Commonly known as: FLAGYL Take 1 tablet (500 mg total) by mouth 2 (two) times daily for 3 days.   pantoprazole 40 MG tablet Commonly known as: PROTONIX TAKE 1 TABLET(40 MG) BY MOUTH DAILY What changed: See the new instructions.   polycarbophil 625 MG tablet Commonly known as: FIBERCON Take 1 tablet (625 mg total) by mouth 2 (two) times daily for 20 days.   potassium chloride SA 20 MEQ tablet Commonly known as: KLOR-CON M Take 1 tablet (20 mEq total) by mouth daily for 5 days.   Prempro 0.45-1.5 MG tablet Generic drug: estrogen (conjugated)-medroxyprogesterone TAKE 1 TABLET BY MOUTH DAILY What changed: when to take this   TUMS PO Take 1 tablet by mouth as needed (reflux).        Follow-up Information     Ardith Dark, MD Follow up in 1 week(s).   Specialty: Family Medicine Contact information: [address] [place] [address] [phone]                Discharge Exam: Ceasar Mons Weights   12/24/22 1027  Weight: 51.7 kg   ***  Condition at discharge: {DC Condition:26389}  The  results of significant diagnostics from this hospitalization (including imaging, microbiology, ancillary and laboratory) are listed below for reference.   Imaging Studies: DG Abd 1 View  Result Date: 12/26/2022 CLINICAL DATA:  [id number] SBO (small bowel obstruction) (HCC) [id number] EXAM: ABDOMEN - 1 VIEW COMPARISON:  the previous day's study FINDINGS: Gastrostomy tube remains in decompressed stomach. Small bowel appears decompressed. Contrast in nondilated bowel loops including colon. IMPRESSION: Nonobstructive bowel gas pattern. Electronically Signed   By: Corlis Leak M.D.   On: 12/26/2022 11:37   DG Abd Portable 1V-Small Bowel  Obstruction Protocol-initial, 8 hr delay  Result Date: 12/25/2022 CLINICAL DATA:  8 hour follow-up small-bowel film EXAM: PORTABLE ABDOMEN - 1 VIEW COMPARISON:  Film from earlier in the same day. FINDINGS: Gastric catheter is noted within the stomach. Contrast administered lies within the dilated loops of small bowel. No definitive colonic contrast is seen. 24 hour follow-up film is recommended. IMPRESSION: Dilated loops of small bowel with contrast within. No definitive colonic contrast is seen. 24 hour follow-up film is recommended. Electronically Signed   By: Alcide Clever M.D.   On: 12/25/2022 20:16   DG Abd 1 View  Result Date: 12/25/2022 CLINICAL DATA:  [id number] SBO (small bowel obstruction) (HCC) [id number] EXAM: ABDOMEN - 1 VIEW COMPARISON:  12/24/2022 FINDINGS: Mildly dilated loops of small bowel within the abdomen, similar to prior. No gross free intraperitoneal air. Degenerative changes of the lumbar spine and hips. IMPRESSION: Mildly dilated loops of small bowel within the abdomen, similar to prior. Electronically Signed   By: Duanne Guess D.O.   On: 12/25/2022 11:41   [place] LIMITED JOINT SPACE STRUCTURES LOW LEFT(NO LINKED CHARGES)  Result Date: 12/24/2022 Procedure: Ultrasound Guided Hip Acetabulofemoral Joint Injection Side: Left Diagnosis: Left hip osteoarthritis [place] Indication: - accuracy is paramount for diagnosis - to ensure therapeutic efficacy or procedural success - to reduce procedural risk  After explaining the procedure, viable alternatives, risks, and answering any questions, consent was given verbally. The site was cleaned with chlorhexidine prep. An ultrasound transducer was placed on the anterior thigh/hip.   The acetabular joint, labrum, and femoral shaft were identified.  The neurovascular structures were identified and an approach was found specifically avoiding these structures.  A steroid injection was performed under ultrasound guidance with sterile technique using 4ml of 1%  lidocaine without epinephrine and 80 mg of triamcinolone (KENALOG) 40mg /ml. This was well tolerated and resulted in  relief.  Needle was removed and dressing placed and post injection instructions were given including  a discussion of likely return of pain today after the anesthetic wears off (with the possibility of worsened pain) until the steroid starts to work in 1-3 days.   Pt was advised to call or return to clinic if these symptoms worsen or fail to improve as anticipated.    CT ABDOMEN PELVIS W CONTRAST  Result Date: 12/24/2022 CLINICAL DATA:  Constipation. Unable to void. Increasing abdominal pain. EXAM: CT ABDOMEN AND PELVIS WITH CONTRAST TECHNIQUE: Multidetector CT imaging of the abdomen and pelvis was performed using the standard protocol following bolus administration of intravenous contrast. RADIATION DOSE REDUCTION: This exam was performed according to the departmental dose-optimization program which includes automated exposure control, adjustment of the mA and/or kV according to patient size and/or use of iterative reconstruction technique. CONTRAST:  OMNIPAQUE IOHEXOL 300 MG/ML  SOLN COMPARISON:  CT 04/25/2020 and older FINDINGS: Lower chest: Breathing motion. Dependent bandlike areas of opacity at the bases likely scar or atelectasis. No pleural effusion.  Hepatobiliary: Patent portal vein. There are some benign-appearing cysts identified in the liver such as medially in the right hepatic lobe measuring 15 mm, unchanged from prior. Benign cyst no specific imaging follow-up. There is also focal fat deposition seen in the liver adjacent to the falciform ligament segment 4. Mild intrahepatic biliary duct ectasia identified with the common duct does taper towards the pancreatic head. Gallbladder is distended. Pancreas: Dilatation of the pancreatic duct identified measuring up to 3 mm, overall mild. No discrete pancreatic mass. 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 Spleen: Normal in  size without focal abnormality. Adrenals/Urinary Tract: The adrenal glands are preserved. No enhancing renal mass or collecting system dilatation. There are some tiny cysts identified in the right kidney, Bosniak 1 and 2 lesions. No specific imaging follow-up. These were seen on the previous noncontrast examination. There is brush appearance to calices bilaterally, please correlate for tubular ectasia. The ureters have normal course and caliber down to the bladder. Preserved contours of the urinary bladder. Stomach/Bowel: No oral contrast. The large bowel has significant colonic diverticulosis of the sigmoid colon. The large bowel overall is relatively decompressed diffusely. Stomach is nondilated. There are several fluid-filled loops of small bowel with diameter approaching 2.9-3 cm, slightly dilated. Surgical changes along loops of bowel in the central upper pelvis. There is a transition to some nondilated bowel identified at the terminal ileum where there is specific fold thickening. Adjacent stranding. Please correlate for an enteritis although there is a differential including infectious, inflammatory and ischemic process. Of note there is prominent second portion duodenal diverticulum. Multiple small bowel diverticula elsewhere as well. Vascular/Lymphatic: Aortic atherosclerosis. No enlarged abdominal or pelvic lymph nodes. Reproductive: Multilobular uterus consistent with multiple fibroids. Other: Trace free fluid in the pelvis. No free intra-air identified. Musculoskeletal: Curvature of the spine. Scattered degenerative changes of the spine and pelvis. IMPRESSION: Mildly dilated fluid-filled loops of small bowel diffusely with transition at the level of the distal ileum where there is some fold thickening and stranding. Please correlate for etiology including differential of infectious, inflammatory and ischemic process. Trace free fluid and mesenteric stranding. No free air. Colonic diverticulosis. Mild  dilatation of the biliary tree but normal tapering of the common duct towards the pancreatic head. 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 Electronically Signed   By: Karen Kays M.D.   On: 12/24/2022 13:15    Microbiology: Results for orders placed or performed during the hospital encounter of 07/09/13  Body fluid culture     Status: None   Collection Time: 07/09/13  7:58 PM  Result Value Ref Range Status   Specimen Description PERITONEAL PERFORATED VISCUS  Final   Special Requests PATIENT ON FOLLOWING ZOSYN  Final   Gram Stain   Final    ABUNDANT WBC PRESENT, PREDOMINANTLY PMN FEW GRAM NEGATIVE RODS Performed at [facility]   Culture   Final    FEW KLEBSIELLA PNEUMONIAE Performed at [facility]   Report Status 07/12/2013 FINAL  Final   Organism ID, Bacteria KLEBSIELLA PNEUMONIAE  Final      Susceptibility   Klebsiella pneumoniae - MIC*    AMPICILLIN RESISTANT      AMPICILLIN/SULBACTAM <=2 SENSITIVE Sensitive     CEFAZOLIN <=4 SENSITIVE Sensitive     CEFEPIME <=1 SENSITIVE Sensitive     CEFTAZIDIME <=1 SENSITIVE Sensitive     CEFTRIAXONE <=1 SENSITIVE Sensitive     CIPROFLOXACIN <=0.25 SENSITIVE Sensitive     GENTAMICIN <=1  SENSITIVE Sensitive     IMIPENEM <=0.25 SENSITIVE Sensitive     PIP/TAZO <=4 SENSITIVE Sensitive     TOBRAMYCIN <=1 SENSITIVE Sensitive     TRIMETH/SULFA <=20 SENSITIVE Sensitive     * FEW KLEBSIELLA PNEUMONIAE  Anaerobic culture     Status: None   Collection Time: 07/09/13  7:58 PM  Result Value Ref Range Status   Specimen Description PERITONEAL PERFORATED VISCUS  Final   Special Requests PATIENT ON FOLLOWING ZOSYN  Final   Gram Stain   Final    ABUNDANT WBC PRESENT, PREDOMINANTLY PMN MODERATE GRAM NEGATIVE RODS Performed at [facility]   Culture   Final    FEW BACTEROIDES FRAGILIS Note: BETA LACTAMASE POSITIVE Performed at [facility]   Report Status 07/16/2013 FINAL  Final  Urine  culture     Status: None   Collection Time: 07/09/13  8:36 PM  Result Value Ref Range Status   Specimen Description URINE, CATHETERIZED  Final   Special Requests PATIENT ON FOLLOWING ZOSYN  Final   Culture  Setup Time   Final    07/10/2013 02:36 Performed at [facility]   Colony Count NO GROWTH Performed at [facility]  Final   Culture NO GROWTH Performed at [facility]  Final   Report Status 07/10/2013 FINAL  Final    Labs: CBC: Recent Labs  Lab 12/24/22 1119 12/25/22 0541 12/26/22 0500 12/27/22 0445 12/28/22 0438  WBC 22.9* 12.5* 13.6* 12.5* 8.1  NEUTROABS 20.3*  --   --   --   --   HGB 16.1* 13.9 14.8 15.6* 13.9  HCT 47.6* 40.7 45.2 47.8* 42.1  MCV 98.3 100.5* 102.5* 101.7* 101.4*  PLT 308 244 294 301 269   Basic Metabolic Panel: Recent Labs  Lab 12/24/22 1119 12/25/22 0429 12/26/22 0500 12/27/22 0445 12/28/22 0438  NA 137 135 140 138 138  K 3.8 4.2 3.7 3.3* 3.4*  CL 102 103 105 102 105  CO2 25 23 25 22 25   GLUCOSE 131* 124* 130* 102* 122*  BUN 26* 21 24* 25* 16  CREATININE 0.67 0.67 0.67 0.54 0.52  CALCIUM 9.6 8.4* 9.0 8.9 8.6*  MG 2.3  --   --  2.3  --   PHOS 4.2  --   --   --   --    Liver Function Tests: Recent Labs  Lab 12/24/22 1119 12/25/22 0429  AST 19 14*  ALT 20 16  ALKPHOS 80 59  BILITOT 0.8 0.7  PROT 6.6 5.1*  ALBUMIN 3.6 2.8*   CBG: No results for input(s): "GLUCAP" in the last 168 hours.  Discharge time spent: {LESS THAN/GREATER ZOXW:96045} 30 minutes.  Signed: Alba Cory, MD [facility] 12/28/2022

## 2022-12-28 NOTE — Plan of Care (Signed)

## 2022-12-28 NOTE — Progress Notes (Signed)
Progress Note     Subjective: Pt denies abdominal pain and reports she is tolerating diet and having bowel function. She feels "normal". Wants to go home   Objective: Vital signs in last 24 hours: Temp:  [97.8 F (36.6 C)-98.2 F (36.8 C)] 97.8 F (36.6 C) (08/12 0528) Pulse Rate:  [83-99] 83 (08/12 0528) Resp:  [16-18] 16 (08/12 0528) BP: (145-166)/(73-98) 166/86 (08/12 0528) SpO2:  [95 %-97 %] 95 % (08/12 0528) Last BM Date : 12/28/22  Intake/Output from previous day: 08/11 0701 - 08/12 0700 In: 600 [P.O.:600] Out: -  Intake/Output this shift: Total I/O In: 240 [P.O.:240] Out: -   PE: General: pleasant, WD, WN female who is laying in bed in NAD Lungs: Respiratory effort nonlabored Abd: soft, NT, mild distention, +BS Psych: A&Ox3 with an appropriate affect.    Lab Results:  Recent Labs    12/27/22 0445 12/28/22 0438  WBC 12.5* 8.1  HGB 15.6* 13.9  HCT 47.8* 42.1  PLT 301 269   BMET Recent Labs    12/27/22 0445 12/28/22 0438  NA 138 138  K 3.3* 3.4*  CL 102 105  CO2 22 25  GLUCOSE 102* 122*  BUN 25* 16  CREATININE 0.54 0.52  CALCIUM 8.9 8.6*   PT/INR No results for input(s): "LABPROT", "INR" in the last 72 hours. CMP     Component Value Date/Time   NA 138 12/28/2022 0438   NA 143 04/07/2017 0000   K 3.4 (L) 12/28/2022 0438   CL 105 12/28/2022 0438   CO2 25 12/28/2022 0438   GLUCOSE 122 (H) 12/28/2022 0438   BUN 16 12/28/2022 0438   BUN 27 (A) 04/07/2017 0000   CREATININE 0.52 12/28/2022 0438   CREATININE 0.75 10/21/2016 0807   CALCIUM 8.6 (L) 12/28/2022 0438   PROT 5.1 (L) 12/25/2022 0429   ALBUMIN 2.8 (L) 12/25/2022 0429   AST 14 (L) 12/25/2022 0429   ALT 16 12/25/2022 0429   ALKPHOS 59 12/25/2022 0429   BILITOT 0.7 12/25/2022 0429   GFRNONAA >60 12/28/2022 0438   GFRAA >60 01/10/2015 0402   Lipase     Component Value Date/Time   LIPASE 28 12/24/2022 1119       Studies/Results: No results  found.  Anti-infectives: Anti-infectives (From admission, onward)    Start     Dose/Rate Route Frequency Ordered Stop   12/28/22 0000  cephALEXin (KEFLEX) 500 MG capsule        500 mg Oral Every 8 hours 12/28/22 1038 12/31/22 2359   12/28/22 0000  metroNIDAZOLE (FLAGYL) 500 MG tablet        500 mg Oral 2 times daily 12/28/22 1038 12/31/22 2359   12/27/22 1515  cephALEXin (KEFLEX) capsule 500 mg        500 mg Oral Every 8 hours 12/27/22 1425     12/27/22 1445  metroNIDAZOLE (FLAGYL) tablet 500 mg        500 mg Oral 2 times daily 12/27/22 1425     12/24/22 2200  piperacillin-tazobactam (ZOSYN) IVPB 3.375 g  Status:  Discontinued        3.375 g 12.5 mL/hr over 240 Minutes Intravenous Every 8 hours 12/24/22 1549 12/27/22 1424   12/24/22 1600  piperacillin-tazobactam (ZOSYN) IVPB 3.375 g        3.375 g 100 mL/hr over 30 Minutes Intravenous  Once 12/24/22 1548 12/24/22 1632   12/24/22 1545  piperacillin-tazobactam (ZOSYN) IVPB 3.375 g  Status:  Discontinued  3.375 g 100 mL/hr over 30 Minutes Intravenous Every 8 hours 12/24/22 1540 12/24/22 1548        Assessment/Plan  Small bowel enteritis vs Psbo - CT 8/8 with concern for above - film 8/10 with non-obstructive bowel gas pattern - patient is tolerating diet and having bowel function  - stable for DC from surgery standpoint    FEN - SOFT VTE - SCDs, LMWH ID - PO keflex/flagyl   LOS: 3 days   I reviewed hospitalist notes, last 24 h vitals and pain scores, last 48 h intake and output, last 24 h labs and trends, and last 24 h imaging results.   Juliet Rude, [facility] 12/28/2022, 11:36 AM Please see Amion for pager number during day hours 7:00am-4:30pm

## 2022-12-28 NOTE — Discharge Summary (Signed)
Physician Discharge Summary   Patient: Ellen [name] MRN: [id number] DOB: 1941-01-10  Admit date:     12/24/2022  Discharge date: 12/28/22  Discharge Physician: Alba Cory   PCP: Ardith Dark, MD   Recommendations at discharge:    Use Miralax as need for constipation.  Follow K level.   Discharge Diagnoses: Principal Problem:   Constipation Active Problems:   Hypertension   Hyperglycemia   Leg edema   Gastroesophageal reflux disease   Volume depletion   Partial small bowel obstruction (HCC)   Polycythemia   Hypokalemia  Resolved Problems:   * No resolved hospital problems. *  Hospital Course: 82 year old with past medical history significant for seasonal allergies, anxiety, osteoarthritis, GERD, hypertension, nephrolithiasis, left buccal cheek keratoacanthoma, squamous cell skin cancer, chronic right lower extremity edema, diverticulosis, ventral hernia repair history of SBO presented complaining of abdominal pain, fatigue, nausea, constipation and decreased oral intake.  CT abdomen and pelvis show mildly dilated fluid filled loops of the small bowel diffusely with transition point at the level of the distal ileum where there is some focal thickening and stranding.   Patient has been admitted for SBO.  Assessment and Plan:  1-partial SBO vs enteritis.  Presents with abdominal pain, distension. CT with PSBO vs enteritis.  Surgery following.  Underwent SBO protocol.  8/09 KUB: Mildly dilated loops of small bowel within the abdomen, similar to prior. On IV zosyn. Transition to keflex/flagyl for 3 more days at discharge NG tube removed 8/11. Had multiples Bowel movements, tolerating diet.  Stable for discharge per surgery.   Hypokalemia;  Replaced   Leukocytosis; trending down.    Dehydration:  -Treated  with IV fluids.      Hypertension:  Resume cozaar and hydrochlorothiazide at discharge. Kcl supplement at discharge for 5 days.    Hyperglycemia:   A1c; 5.2 normal.    GERD: PPI>      Polycythemia, hemoconcentration. Treated  with IV fluids             Consultants: None Procedures performed: None Disposition: Home Diet recommendation:  Discharge Diet Orders (From admission, onward)     Start     Ordered   12/28/22 0000  Diet - low sodium heart healthy        12/28/22 1038           Cardiac diet DISCHARGE MEDICATION: Allergies as of 12/28/2022       Reactions   Lactose Intolerance (gi) Diarrhea   Sulfa Antibiotics Nausea And Vomiting, Swelling        Medication List     TAKE these medications    acetaminophen 325 MG tablet Commonly known as: TYLENOL Take 650 mg by mouth as needed for moderate pain.   cephALEXin 500 MG capsule Commonly known as: KEFLEX Take 1 capsule (500 mg total) by mouth every 8 (eight) hours for 3 days.   EYE DROPS OP Place 2 drops into both eyes daily. Thera Tears   hydrochlorothiazide 12.5 MG capsule Commonly known as: MICROZIDE TAKE 1 CAPSULE(12.5 MG) BY MOUTH DAILY   ibuprofen 200 MG tablet Commonly known as: ADVIL Take 200 mg by mouth as needed for moderate pain.   losartan 50 MG tablet Commonly known as: COZAAR TAKE 1 TABLET(50 MG) BY MOUTH DAILY   metroNIDAZOLE 500 MG tablet Commonly known as: FLAGYL Take 1 tablet (500 mg total) by mouth 2 (two) times daily for 3 days.   pantoprazole 40 MG tablet Commonly known as: PROTONIX TAKE  1 TABLET(40 MG) BY MOUTH DAILY What changed: See the new instructions.   potassium chloride SA 20 MEQ tablet Commonly known as: KLOR-CON M Take 1 tablet (20 mEq total) by mouth daily for 5 days.   Prempro 0.45-1.5 MG tablet Generic drug: estrogen (conjugated)-medroxyprogesterone TAKE 1 TABLET BY MOUTH DAILY What changed: when to take this   TUMS PO Take 1 tablet by mouth as needed (reflux).        Follow-up Information     Ardith Dark, MD Follow up in 1 week(s).   Specialty: Family Medicine Contact  information: [address] [place] [address] [phone]                Discharge Exam: Ceasar Mons Weights   12/24/22 1027  Weight: 51.7 kg   General; NAD  Condition at discharge: stable  The results of significant diagnostics from this hospitalization (including imaging, microbiology, ancillary and laboratory) are listed below for reference.   Imaging Studies: DG Abd 1 View  Result Date: 12/26/2022 CLINICAL DATA:  [id number] SBO (small bowel obstruction) (HCC) [id number] EXAM: ABDOMEN - 1 VIEW COMPARISON:  the previous day's study FINDINGS: Gastrostomy tube remains in decompressed stomach. Small bowel appears decompressed. Contrast in nondilated bowel loops including colon. IMPRESSION: Nonobstructive bowel gas pattern. Electronically Signed   By: Corlis Leak M.D.   On: 12/26/2022 11:37   DG Abd Portable 1V-Small Bowel Obstruction Protocol-initial, 8 hr delay  Result Date: 12/25/2022 CLINICAL DATA:  8 hour follow-up small-bowel film EXAM: PORTABLE ABDOMEN - 1 VIEW COMPARISON:  Film from earlier in the same day. FINDINGS: Gastric catheter is noted within the stomach. Contrast administered lies within the dilated loops of small bowel. No definitive colonic contrast is seen. 24 hour follow-up film is recommended. IMPRESSION: Dilated loops of small bowel with contrast within. No definitive colonic contrast is seen. 24 hour follow-up film is recommended. Electronically Signed   By: Alcide Clever M.D.   On: 12/25/2022 20:16   DG Abd 1 View  Result Date: 12/25/2022 CLINICAL DATA:  [id number] SBO (small bowel obstruction) (HCC) [id number] EXAM: ABDOMEN - 1 VIEW COMPARISON:  12/24/2022 FINDINGS: Mildly dilated loops of small bowel within the abdomen, similar to prior. No gross free intraperitoneal air. Degenerative changes of the lumbar spine and hips. IMPRESSION: Mildly dilated loops of small bowel within the abdomen, similar to prior. Electronically Signed   By: Duanne Guess D.O.   On: 12/25/2022 11:41    [place] LIMITED JOINT SPACE STRUCTURES LOW LEFT(NO LINKED CHARGES)  Result Date: 12/24/2022 Procedure: Ultrasound Guided Hip Acetabulofemoral Joint Injection Side: Left Diagnosis: Left hip osteoarthritis [place] Indication: - accuracy is paramount for diagnosis - to ensure therapeutic efficacy or procedural success - to reduce procedural risk  After explaining the procedure, viable alternatives, risks, and answering any questions, consent was given verbally. The site was cleaned with chlorhexidine prep. An ultrasound transducer was placed on the anterior thigh/hip.   The acetabular joint, labrum, and femoral shaft were identified.  The neurovascular structures were identified and an approach was found specifically avoiding these structures.  A steroid injection was performed under ultrasound guidance with sterile technique using 4ml of 1% lidocaine without epinephrine and 80 mg of triamcinolone (KENALOG) 40mg /ml. This was well tolerated and resulted in  relief.  Needle was removed and dressing placed and post injection instructions were given including  a discussion of likely return of pain today after the anesthetic wears off (with the possibility of worsened pain) until the steroid starts  to work in 1-3 days.   Pt was advised to call or return to clinic if these symptoms worsen or fail to improve as anticipated.    CT ABDOMEN PELVIS W CONTRAST  Result Date: 12/24/2022 CLINICAL DATA:  Constipation. Unable to void. Increasing abdominal pain. EXAM: CT ABDOMEN AND PELVIS WITH CONTRAST TECHNIQUE: Multidetector CT imaging of the abdomen and pelvis was performed using the standard protocol following bolus administration of intravenous contrast. RADIATION DOSE REDUCTION: This exam was performed according to the departmental dose-optimization program which includes automated exposure control, adjustment of the mA and/or kV according to patient size and/or use of iterative reconstruction technique. CONTRAST:  OMNIPAQUE  IOHEXOL 300 MG/ML  SOLN COMPARISON:  CT 04/25/2020 and older FINDINGS: Lower chest: Breathing motion. Dependent bandlike areas of opacity at the bases likely scar or atelectasis. No pleural effusion. Hepatobiliary: Patent portal vein. There are some benign-appearing cysts identified in the liver such as medially in the right hepatic lobe measuring 15 mm, unchanged from prior. Benign cyst no specific imaging follow-up. There is also focal fat deposition seen in the liver adjacent to the falciform ligament segment 4. Mild intrahepatic biliary duct ectasia identified with the common duct does taper towards the pancreatic head. Gallbladder is distended. Pancreas: Dilatation of the pancreatic duct identified measuring up to 3 mm, overall mild. No discrete pancreatic mass. 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 Spleen: Normal in size without focal abnormality. Adrenals/Urinary Tract: The adrenal glands are preserved. No enhancing renal mass or collecting system dilatation. There are some tiny cysts identified in the right kidney, Bosniak 1 and 2 lesions. No specific imaging follow-up. These were seen on the previous noncontrast examination. There is brush appearance to calices bilaterally, please correlate for tubular ectasia. The ureters have normal course and caliber down to the bladder. Preserved contours of the urinary bladder. Stomach/Bowel: No oral contrast. The large bowel has significant colonic diverticulosis of the sigmoid colon. The large bowel overall is relatively decompressed diffusely. Stomach is nondilated. There are several fluid-filled loops of small bowel with diameter approaching 2.9-3 cm, slightly dilated. Surgical changes along loops of bowel in the central upper pelvis. There is a transition to some nondilated bowel identified at the terminal ileum where there is specific fold thickening. Adjacent stranding. Please correlate for an enteritis although there is a differential including  infectious, inflammatory and ischemic process. Of note there is prominent second portion duodenal diverticulum. Multiple small bowel diverticula elsewhere as well. Vascular/Lymphatic: Aortic atherosclerosis. No enlarged abdominal or pelvic lymph nodes. Reproductive: Multilobular uterus consistent with multiple fibroids. Other: Trace free fluid in the pelvis. No free intra-air identified. Musculoskeletal: Curvature of the spine. Scattered degenerative changes of the spine and pelvis. IMPRESSION: Mildly dilated fluid-filled loops of small bowel diffusely with transition at the level of the distal ileum where there is some fold thickening and stranding. Please correlate for etiology including differential of infectious, inflammatory and ischemic process. Trace free fluid and mesenteric stranding. No free air. Colonic diverticulosis. Mild dilatation of the biliary tree but normal tapering of the common duct towards the pancreatic head. 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 Electronically Signed   By: Karen Kays M.D.   On: 12/24/2022 13:15    Microbiology: Results for orders placed or performed during the hospital encounter of 07/09/13  Body fluid culture     Status: None   Collection Time: 07/09/13  7:58 PM  Result Value Ref Range Status   Specimen Description  PERITONEAL PERFORATED VISCUS  Final   Special Requests PATIENT ON FOLLOWING ZOSYN  Final   Gram Stain   Final    ABUNDANT WBC PRESENT, PREDOMINANTLY PMN FEW GRAM NEGATIVE RODS Performed at [facility]   Culture   Final    FEW KLEBSIELLA PNEUMONIAE Performed at [facility]   Report Status 07/12/2013 FINAL  Final   Organism ID, Bacteria KLEBSIELLA PNEUMONIAE  Final      Susceptibility   Klebsiella pneumoniae - MIC*    AMPICILLIN RESISTANT      AMPICILLIN/SULBACTAM <=2 SENSITIVE Sensitive     CEFAZOLIN <=4 SENSITIVE Sensitive     CEFEPIME <=1 SENSITIVE Sensitive     CEFTAZIDIME <=1 SENSITIVE  Sensitive     CEFTRIAXONE <=1 SENSITIVE Sensitive     CIPROFLOXACIN <=0.25 SENSITIVE Sensitive     GENTAMICIN <=1 SENSITIVE Sensitive     IMIPENEM <=0.25 SENSITIVE Sensitive     PIP/TAZO <=4 SENSITIVE Sensitive     TOBRAMYCIN <=1 SENSITIVE Sensitive     TRIMETH/SULFA <=20 SENSITIVE Sensitive     * FEW KLEBSIELLA PNEUMONIAE  Anaerobic culture     Status: None   Collection Time: 07/09/13  7:58 PM  Result Value Ref Range Status   Specimen Description PERITONEAL PERFORATED VISCUS  Final   Special Requests PATIENT ON FOLLOWING ZOSYN  Final   Gram Stain   Final    ABUNDANT WBC PRESENT, PREDOMINANTLY PMN MODERATE GRAM NEGATIVE RODS Performed at [facility]   Culture   Final    FEW BACTEROIDES FRAGILIS Note: BETA LACTAMASE POSITIVE Performed at [facility]   Report Status 07/16/2013 FINAL  Final  Urine culture     Status: None   Collection Time: 07/09/13  8:36 PM  Result Value Ref Range Status   Specimen Description URINE, CATHETERIZED  Final   Special Requests PATIENT ON FOLLOWING ZOSYN  Final   Culture  Setup Time   Final    07/10/2013 02:36 Performed at [facility]   Colony Count NO GROWTH Performed at [facility]  Final   Culture NO GROWTH Performed at [facility]  Final   Report Status 07/10/2013 FINAL  Final    Labs: CBC: Recent Labs  Lab 12/24/22 1119 12/25/22 0541 12/26/22 0500 12/27/22 0445 12/28/22 0438  WBC 22.9* 12.5* 13.6* 12.5* 8.1  NEUTROABS 20.3*  --   --   --   --   HGB 16.1* 13.9 14.8 15.6* 13.9  HCT 47.6* 40.7 45.2 47.8* 42.1  MCV 98.3 100.5* 102.5* 101.7* 101.4*  PLT 308 244 294 301 269   Basic Metabolic Panel: Recent Labs  Lab 12/24/22 1119 12/25/22 0429 12/26/22 0500 12/27/22 0445 12/28/22 0438  NA 137 135 140 138 138  K 3.8 4.2 3.7 3.3* 3.4*  CL 102 103 105 102 105  CO2 25 23 25 22 25   GLUCOSE 131* 124* 130* 102* 122*  BUN 26* 21 24* 25* 16  CREATININE 0.67 0.67 0.67 0.54 0.52  CALCIUM 9.6  8.4* 9.0 8.9 8.6*  MG 2.3  --   --  2.3  --   PHOS 4.2  --   --   --   --    Liver Function Tests: Recent Labs  Lab 12/24/22 1119 12/25/22 0429  AST 19 14*  ALT 20 16  ALKPHOS 80 59  BILITOT 0.8 0.7  PROT 6.6 5.1*  ALBUMIN 3.6 2.8*   CBG: No results for input(s): "GLUCAP" in the last 168 hours.  Discharge time spent: greater than 30 minutes.  Signed: Alba Cory, MD [facility] 12/28/2022

## 2022-12-28 NOTE — Progress Notes (Signed)
Patient discharged home, IV removed on previous shift, discharge paperwork explained and patient verbalized understanding.

## 2022-12-29 ENCOUNTER — Telehealth: Payer: Self-pay

## 2022-12-29 ENCOUNTER — Other Ambulatory Visit: Payer: Self-pay | Admitting: Family Medicine

## 2022-12-29 NOTE — Transitions of Care (Post Inpatient/ED Visit) (Signed)
   12/29/2022  Name: Ellen [name] MRN: [id number] DOB: [birth year]  Today's TOC FU Call Status: Today's TOC FU Call Status:: Unsuccessful Call (1st Attempt) Unsuccessful Call (1st Attempt) Date: 12/29/22  Attempted to reach the patient regarding the most recent Inpatient/ED visit.  Follow Up Plan: Additional outreach attempts will be made to reach the patient to complete the Transitions of Care (Post Inpatient/ED visit) call.   Signature Alyse Low, RN, [place], [facility], [facility] Care Management Coordinator/TOC Ph [phone]

## 2022-12-30 ENCOUNTER — Telehealth: Payer: Self-pay

## 2022-12-30 ENCOUNTER — Ambulatory Visit: Payer: Self-pay

## 2022-12-30 NOTE — Transitions of Care (Post Inpatient/ED Visit) (Signed)
   12/30/2022  Name: Ellen [name] MRN: [id number] DOB: 11-04-1940  Today's TOC FU Call Status: Today's TOC FU Call Status:: Unsuccessful Call (2nd Attempt) Unsuccessful Call (2nd Attempt) Date: 12/30/22  Attempted to reach the patient regarding the most recent Inpatient/ED visit.  Follow Up Plan: Additional outreach attempts will be made to reach the patient to complete the Transitions of Care (Post Inpatient/ED visit) call.      Antionette Fairy, RN,BSN,CCM [facility] Care Management Care Management Community Coordinator Direct Phone: [phone] Toll Free: [phone] Fax: [phone]

## 2022-12-30 NOTE — Transitions of Care (Post Inpatient/ED Visit) (Signed)
12/30/2022  Name: Ellen [name] MRN: [id number] DOB: 08/08/1940  Today's TOC FU Call Status: Today's TOC FU Call Status:: Successful TOC FU Call Completed TOC FU Call Complete Date: 12/30/22 (Incoming call from patient-returning RN CM call)  Transition Care Management Follow-up Telephone Call Date of Discharge: 12/28/22 Discharge Facility: [facility]) Type of Discharge: Inpatient Admission Primary Inpatient Discharge Diagnosis:: "constipation" How have you been since you were released from the hospital?: Better (Pt vocies she is doing better-no issues since coming home-'getting ready to mow yard". Appetite good. No N&V. She had a small BM this morning.) Any questions or concerns?: No  Items Reviewed: Did you receive and understand the discharge instructions provided?: Yes Medications obtained,verified, and reconciled?: Yes (Medications Reviewed) Any new allergies since your discharge?: No Dietary orders reviewed?: Yes Type of Diet Ordered:: low salt/heart healthy Do you have support at home?: Yes People in Home: alone  Medications Reviewed Today: Medications Reviewed Today     Reviewed by Charlyn Minerva, RN (Registered Nurse) on 12/30/22 at 1120  Med List Status: <None>   Medication Order Taking? Sig Documenting Provider Last Dose Status Informant  acetaminophen (TYLENOL) 325 MG tablet [id number] Yes Take 650 mg by mouth as needed for moderate pain. [provider] Taking Active Self, Pharmacy Records  Calcium Carbonate Antacid (TUMS PO) [id number] Yes Take 1 tablet by mouth as needed (reflux). [provider] Taking Active Self, Pharmacy Records  Carboxymethylcellulose Sodium (EYE DROPS OP) [id number] Yes Place 2 drops into both eyes daily. Thera Tears [provider] Taking Active Self, Pharmacy Records  cephALEXin (KEFLEX) 500 MG capsule [id number] Yes Take 1 capsule (500 mg total) by mouth every 8 (eight) hours for 3 days.  Regalado, Belkys A, MD Taking Active   hydrochlorothiazide (MICROZIDE) 12.5 MG capsule [id number] Yes TAKE 1 CAPSULE(12.5 MG) BY MOUTH DAILY Ardith Dark, MD Taking Active   ibuprofen (ADVIL) 200 MG tablet [id number] Yes Take 200 mg by mouth as needed for moderate pain. [provider] Taking Active Self, Pharmacy Records  losartan (COZAAR) 50 MG tablet [id number] Yes TAKE 1 TABLET(50 MG) BY MOUTH DAILY Ardith Dark, MD Taking Active   metroNIDAZOLE (FLAGYL) 500 MG tablet [id number] Yes Take 1 tablet (500 mg total) by mouth 2 (two) times daily for 3 days. Regalado, Belkys A, MD Taking Active   pantoprazole (PROTONIX) 40 MG tablet [id number] Yes TAKE 1 TABLET(40 MG) BY MOUTH DAILY  Patient taking differently: Take 40 mg by mouth daily as needed (reflux).   Imogene Burn, MD Taking Active Self, Pharmacy Records  potassium chloride SA (KLOR-CON M) 20 MEQ tablet [id number] Yes Take 1 tablet (20 mEq total) by mouth daily for 5 days. Alba Cory, MD Taking Active   PREMPRO 0.45-1.5 MG tablet [id number] Yes TAKE 1 TABLET BY MOUTH DAILY  Patient taking differently: Take 1 tablet by mouth every 3 (three) days.   Ardith Dark, MD Taking Active Self, Pharmacy Records  Med List Note Erick Alley, [facility] 11/17/18 1630):              Home Care and Equipment/Supplies: Were Home Health Services Ordered?: NA Any new equipment or medical supplies ordered?: NA  Functional Questionnaire: Do you need assistance with bathing/showering or dressing?: No Do you need assistance with meal preparation?: No Do you need assistance with eating?: No Do you have difficulty maintaining continence: No Do you need assistance with getting out of bed/getting out of a chair/moving?: No Do you have  difficulty managing or taking your medications?: No  Follow up appointments reviewed: PCP Follow-up appointment confirmed?: Yes Date of PCP follow-up appointment?: 01/07/23 Follow-up Provider: Dr.  Jimmey Ralph Specialist [facility] Follow-up appointment confirmed?: Yes Date of Specialist follow-up appointment?: 01/11/23 Follow-Up Specialty Provider:: Dr. Magnus Ivan Do you need transportation to your follow-up appointment?: No Do you understand care options if your condition(s) worsen?: Yes-patient verbalized understanding  SDOH Interventions Today    Flowsheet Row Most Recent Value  SDOH Interventions   Food Insecurity Interventions Intervention Not Indicated  Transportation Interventions Intervention Not Indicated       TOC Interventions Today    Flowsheet Row Most Recent Value  TOC Interventions   TOC Interventions Discussed/Reviewed TOC Interventions Discussed      Interventions Today    Flowsheet Row Most Recent Value  Chronic Disease   Chronic disease during today's visit Hypertension (HTN)  General Interventions   General Interventions Discussed/Reviewed General Interventions Discussed, Durable Medical Equipment (DME), Doctor Visits  Doctor Visits Discussed/Reviewed Doctor Visits Discussed, PCP, Specialist  Durable Medical Equipment (DME) BP Cuff  [pt confirms she has BP machine in the home and monitors BP consistently-no issues wtih BP]  PCP/Specialist Visits Compliance with follow-up visit  Education Interventions   Education Provided Provided Education  Provided Verbal Education On Nutrition, Medication, When to see the doctor, Other  [sx/ GI mgmt]  Nutrition Interventions   Nutrition Discussed/Reviewed Nutrition Discussed, Adding fruits and vegetables, Decreasing salt, Increasing proteins, Decreasing fats  Pharmacy Interventions   Pharmacy Dicussed/Reviewed Pharmacy Topics Discussed, Medications and their functions  Safety Interventions   Safety Discussed/Reviewed Safety Discussed        Alessandra Grout [facility] Care Management Care Management Community Coordinator Direct Phone: [phone] Toll Free: [phone] Fax:  [phone]

## 2023-01-07 ENCOUNTER — Ambulatory Visit (HOSPITAL_BASED_OUTPATIENT_CLINIC_OR_DEPARTMENT_OTHER)
Admission: RE | Admit: 2023-01-07 | Discharge: 2023-01-07 | Disposition: A | Discharge status: Home / Self Care | Payer: Medicare Other | Source: Ambulatory Visit | Attending: Family Medicine | Admitting: Family Medicine

## 2023-01-07 ENCOUNTER — Ambulatory Visit: Payer: Medicare Other | Admitting: Family Medicine

## 2023-01-07 ENCOUNTER — Encounter: Payer: Self-pay | Admitting: Family Medicine

## 2023-01-07 VITALS — BP 120/58 | HR 82 | Temp 98.0°F | Ht 64.0 in | Wt 108.0 lb

## 2023-01-07 DIAGNOSIS — I10 Essential (primary) hypertension: Secondary | ICD-10-CM | POA: Diagnosis not present

## 2023-01-07 DIAGNOSIS — K5712 Diverticulitis of small intestine without perforation or abscess without bleeding: Secondary | ICD-10-CM

## 2023-01-07 DIAGNOSIS — Z8719 Personal history of other diseases of the digestive system: Secondary | ICD-10-CM

## 2023-01-07 DIAGNOSIS — M7989 Other specified soft tissue disorders: Secondary | ICD-10-CM | POA: Diagnosis not present

## 2023-01-07 DIAGNOSIS — R600 Localized edema: Secondary | ICD-10-CM | POA: Diagnosis not present

## 2023-01-07 DIAGNOSIS — K56609 Unspecified intestinal obstruction, unspecified as to partial versus complete obstruction: Secondary | ICD-10-CM

## 2023-01-07 DIAGNOSIS — M79604 Pain in right leg: Secondary | ICD-10-CM | POA: Diagnosis not present

## 2023-01-07 MED ORDER — DOXYCYCLINE HYCLATE 100 MG PO TABS: 100.0000 mg | ORAL_TABLET | Freq: Two times a day (BID) | ORAL | 0 refills | Status: DC

## 2023-01-07 NOTE — Progress Notes (Signed)
Chief Complaint:  Ellen [name] is a 82 y.o. female who presents today for a TCM visit.  Assessment/Plan:  New/Acute Problems: SBO Resolved.  We discussed reasons to return to care.   Leg Edema  Concern for cellulitis.  Given recent hospitalization will cover for MRSA with doxycycline 100 mg twice daily for 14 days.  Differential also includes DVT.  Given recent hospitalization, low utility for D-dimer at this point.  Will proceed with stat Doppler to rule this out.  No signs of systemic illness and stable vital signs today.  She will follow-up in 1 to 2 weeks or sooner if not improving.  We discussed reasons to return to care or seek emergent care.  Chronic Problems Addressed Today: Hypertension At goal today on losartan 50 mg daily and HCTZ 12.5 mg daily.  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     Subjective:  HPI:  Summary of Hospital admission: Reason for admission: Enteritis, SBO Date of admission: 12/24/2022 Date of discharge: 12/28/2022 Date of Interactive contact: 12/30/2022 Summary of Hospital course: Patient presented to the ED on 12/24/2022 with abdominal pain, fatigue, and constipation.  CT showed dilated fluid-filled loops of small bowel concerning for possible SBO.  Surgery was consulted.  She was treated conservatively.  NG tube was placed.  She was started on IV Zosyn for concern for enteritis.  Her NG tube was removed on hospital day 2 and she was able to advance diet.  Had multiple bowel movements prior to discharge.  She was discharged home on 12/30/2022 in stable condition.  Her Zosyn was transition to Keflex/Flagyl at the time of discharge.  Interim history:  She initially did well for several days after being home.  She has not had any recurrence of nausea, vomiting, or constipation.   She has been able to tolerate her normal diet without any concerns.  Her main concern today is pain and swelling in her right lower extremity.  This started about 3 days after discharge and has been progressively worsening the last several days.  It has swollen to the point where she is now getting weeping to the area.  Very painful on palpation.  No fevers or chills.   ROS: Per HPI, otherwise a complete review of systems was negative.   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   Left Buccal Cheek (Keratoacanthoma)   Umbilical hernia    Patient Active Problem List   Diagnosis Date Noted   Hypokalemia 12/27/2022   Volume depletion 12/24/2022   Constipation 12/24/2022   Partial small bowel obstruction (HCC) 12/24/2022   Polycythemia 12/24/2022   NSAID long-term use 08/26/2022   Gastroesophageal reflux disease 08/26/2022   Bloating 08/26/2022   Small intestinal bacterial overgrowth (SIBO) 08/26/2022   Osteoarthritis 12/01/2021   Plantar wart 12/01/2021   Ventral hernia 01/01/2021   Diverticulosis 01/05/2019   Dermatitis 01/05/2019   Systolic murmur 07/25/2018   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   Hyperglycemia 10/05/2017   Leg edema 10/05/2017   Hammer toe  10/05/2017   Hot flashes due to menopause 10/05/2017   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/10/2013 07/11/2013   Hypertension 07/09/2013   Past Surgical History:  Procedure Laterality Date   BOWEL RESECTION  07/09/2013   Procedure: SMALL BOWEL RESECTION;  Surgeon: Velora Heckler, MD;  Location: [facility];  Service: General;;   LAPAROTOMY N/A 07/09/2013   Procedure: EXPLORATORY LAPAROTOMY;  Surgeon: Velora Heckler, MD;  Location: [facility];  Service: General;   Laterality: N/A;   SHOULDER ARTHROSCOPY WITH CAPSULORRHAPHY      Family History  Problem Relation Age of Onset   Diabetes Mother    Heart attack Mother    CAD Father    Heart attack Father    Breast cancer Neg Hx    Colon cancer Neg Hx    Esophageal cancer Neg Hx    Rectal cancer Neg Hx    Stomach cancer Neg Hx     Medications- Reconciled discharge and current medications in Epic.  Current Outpatient Medications  Medication Sig Dispense Refill   acetaminophen (TYLENOL) 325 MG tablet Take 650 mg by mouth as needed for moderate pain.     Calcium Carbonate Antacid (TUMS PO) Take 1 tablet by mouth as needed (reflux).     Carboxymethylcellulose Sodium (EYE DROPS OP) Place 2 drops into both eyes daily. Thera Tears     doxycycline (VIBRA-TABS) 100 MG tablet Take 1 tablet (100 mg total) by mouth 2 (two) times daily. 28 tablet 0   hydrochlorothiazide (MICROZIDE) 12.5 MG capsule TAKE 1 CAPSULE(12.5 MG) BY MOUTH DAILY 90 capsule 1   ibuprofen (ADVIL) 200 MG tablet Take 200 mg by mouth as needed for moderate pain.     losartan (COZAAR) 50 MG tablet TAKE 1 TABLET(50 MG) BY MOUTH DAILY 90 tablet 1   pantoprazole (PROTONIX) 40 MG tablet TAKE 1 TABLET(40 MG) BY MOUTH DAILY (Patient taking differently: Take 40 mg by mouth daily as needed (reflux).) 90 tablet 0   PREMPRO 0.45-1.5 MG tablet TAKE 1 TABLET BY MOUTH DAILY (Patient taking differently: Take 1 tablet by mouth every 3 (three) days.) 28 tablet 0   No current facility-administered medications for this visit.    Allergies-reviewed and updated Allergies  Allergen Reactions   Lactose Intolerance (Gi) Diarrhea   Sulfa Antibiotics Nausea And Vomiting and Swelling    Social History   Socioeconomic History   Marital status: Married    Spouse name: Not on file   Number of children: 0   Years of education: Not on file   Highest education level: Not on file  Occupational History   Occupation: Retired   Tobacco Use   Smoking status:  Former    Types: Cigarettes   Smokeless tobacco: Never  Vaping Use   Vaping status: Never Used  Substance and Sexual Activity   Alcohol use: Yes    Alcohol/week: 7.0 standard drinks of alcohol    Types: 7 Glasses of wine per week   Drug use: No   Sexual activity: Not Currently  Other Topics Concern   Not on file  Social History Narrative   Enjoys gardening    Social Determinants of Health   Financial Resource Strain: Low Risk  (07/16/2022)   Overall Financial Resource Strain (CARDIA)    Difficulty of Paying Living Expenses: Not hard at all  Food Insecurity: No Food Insecurity (12/30/2022)   Hunger Vital Sign    Worried About Running Out of Food in the Last Year: Never true  Ran Out of Food in the Last Year: Never true  Transportation Needs: No Transportation Needs (12/30/2022)   PRAPARE - Administrator, Civil Service (Medical): No    Lack of Transportation (Non-Medical): No  Physical Activity: Sufficiently Active (07/16/2022)   Exercise Vital Sign    Days of Exercise per Week: 5 days    Minutes of Exercise per Session: 120 min  Stress: No Stress Concern Present (07/16/2022)   [employer] of Occupational Health - Occupational Stress Questionnaire    Feeling of Stress : Not at all  Social Connections: Moderately Integrated (07/16/2022)   Social Connection and Isolation Panel [NHANES]    Frequency of Communication with Friends and Family: More than three times a week    Frequency of Social Gatherings with Friends and Family: More than three times a week    Attends Religious Services: More than 4 times per year    Active Member of [employer] or Organizations: No    Attends Engineer, structural: Never    Marital Status: Married        Objective:  Physical Exam: BP (!) 120/58   Pulse 82   Temp 98 F (36.7 C) (Temporal)   Ht 5\' 4"  (1.626 m)   Wt 108 lb (49 kg)   SpO2 96%   BMI 18.54 kg/m   Gen: NAD, resting comfortably CV: RRR with no murmurs  appreciated Pulm: NWOB, CTAB with no crackles, wheezes, or rhonchi GI: Normal bowel sounds present. Soft, Nontender, Nondistended. MSK: Right lower extremity erythematous and edematous with weeping.  Warm to touch and tender to palpation.  See below picture. Skin: Warm, dry Neuro: Grossly normal, moves all extremities Psych: Normal affect and thought content    Time Spent: 45 minutes of total time was spent on the date of the encounter performing the following actions: chart review prior to seeing the patient including recent hospitalization, obtaining history, performing a medically necessary exam, counseling on the treatment plan, placing orders, and documenting in our EHR.       Katina Degree. Jimmey Ralph, MD 01/07/2023 8:12 AM

## 2023-01-07 NOTE — Assessment & Plan Note (Signed)
No signs of diverticulitis on most recent CT scan in the ED though this probably did contribute to her SBO due to prior history of bowel resection.

## 2023-01-07 NOTE — Assessment & Plan Note (Signed)
At goal today on losartan 50 mg daily and HCTZ 12.5 mg daily.

## 2023-01-07 NOTE — Patient Instructions (Addendum)
It was very nice to see you today!  I am glad that your GI symptoms have resolved.  I believe you may have an infection in your leg.  We need to make sure that you do not have a blood clot.  Please start the doxycycline.  We will check an ultrasound to evaluate for a blood clot.  Please let us know if your symptoms are not improving over the next several days.  Return in about 1 week (around 01/14/2023).   Take care, Dr Jimmey Ralph  PLEASE NOTE:  If you had any lab tests, please let us know if you have not heard back within a few days. You may see your results on mychart before we have a chance to review them but we will give you a call once they are reviewed by [place].   If we ordered any referrals today, please let us know if you have not heard from their office within the next week.   If you had any urgent prescriptions sent in today, please check with the pharmacy within an hour of our visit to make sure the prescription was transmitted appropriately.   Please try these tips to maintain a healthy lifestyle:  Eat at least 3 REAL meals and 1-2 snacks per day.  Aim for no more than 5 hours between eating.  If you eat breakfast, please do so within one hour of getting up.   Each meal should contain half fruits/vegetables, one quarter protein, and one quarter carbs (no bigger than a computer mouse)  Cut down on sweet beverages. This includes juice, soda, and sweet tea.   Drink at least 1 glass of water with each meal and aim for at least 8 glasses per day  Exercise at least 150 minutes every week.

## 2023-01-07 NOTE — Addendum Note (Signed)
Addended by: Ardith Dark on: 01/07/2023 08:51 AM   Modules accepted: Orders

## 2023-01-07 NOTE — Progress Notes (Signed)
Great news!  Her ultrasound did not show any signs of a blood clot.  She should start the antibiotic as we discussed and follow-up with [place] in 1 to 2 weeks.

## 2023-01-11 ENCOUNTER — Ambulatory Visit: Payer: Medicare Other | Admitting: Orthopaedic Surgery

## 2023-01-12 ENCOUNTER — Encounter: Payer: Self-pay | Admitting: Internal Medicine

## 2023-01-12 ENCOUNTER — Ambulatory Visit (INDEPENDENT_AMBULATORY_CARE_PROVIDER_SITE_OTHER): Payer: Medicare Other | Admitting: Internal Medicine

## 2023-01-12 VITALS — BP 160/100 | HR 88 | Ht 61.0 in | Wt 109.2 lb

## 2023-01-12 DIAGNOSIS — K219 Gastro-esophageal reflux disease without esophagitis: Secondary | ICD-10-CM

## 2023-01-12 DIAGNOSIS — K5712 Diverticulitis of small intestine without perforation or abscess without bleeding: Secondary | ICD-10-CM | POA: Diagnosis not present

## 2023-01-12 DIAGNOSIS — Z8719 Personal history of other diseases of the digestive system: Secondary | ICD-10-CM | POA: Diagnosis not present

## 2023-01-12 NOTE — Progress Notes (Signed)
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  She actually had CT proven jejunal diverticulitis in 2016 involving a 3.5 cm diverticulum.  Imaging also showed colonic diverticulosis.  Interval History: She was recently admitted for SBO and cellulitis 8/8-8/12. The cellulitis is still painful and she is still on antibiotics. She sees her PCP again on Friday to see how her cellulitis is progressing. She enjoys eating fresh fruits and vegetables. Denies N&V or ab pain. She is passing stools. Weight went down due to her recent hospitalization. She uses Aleve 1-2 times per week, and tries to stay away from it. Her reflux is under good control. She stopped her pantoprazole because she doesn't think that she needs this medication.  Patient did see some improvement after her most recent course of rifaximin.  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 Her smoking use included cigarettes. She has  never used smokeless tobacco. She reports current alcohol use of about 7.0 standard drinks of alcohol per week. She reports that she does not use drugs. family history includes CAD in her father; Diabetes in her mother; Heart attack in her father and mother. Allergies  Allergen Reactions   Lactose Intolerance (Gi) Diarrhea   Sulfa Antibiotics Nausea And Vomiting and Swelling      Outpatient Encounter Medications as of 01/12/2023  Medication Sig   acetaminophen (TYLENOL) 325 MG tablet Take 650 mg by mouth as needed for moderate pain.   Calcium Carbonate Antacid (TUMS PO) Take 1 tablet by mouth as needed (reflux).   Carboxymethylcellulose Sodium (EYE DROPS OP) Place 2 drops into both eyes daily. Thera Tears   doxycycline (VIBRA-TABS) 100 MG tablet Take 1 tablet (100 mg total) by mouth 2 (two) times daily.   hydrochlorothiazide (MICROZIDE) 12.5 MG capsule TAKE 1 CAPSULE(12.5 MG) BY MOUTH DAILY   losartan (COZAAR) 50 MG tablet TAKE 1 TABLET(50 MG) BY MOUTH DAILY   pantoprazole (PROTONIX) 40 MG tablet TAKE 1 TABLET(40 MG) BY MOUTH DAILY (Patient taking differently: Take 40 mg by mouth daily as needed (reflux).)   PREMPRO 0.45-1.5 MG tablet TAKE 1 TABLET BY MOUTH DAILY (Patient taking differently: Take 1 tablet by mouth every 3 (three) days.)   [DISCONTINUED] ibuprofen (ADVIL) 200 MG tablet Take 200 mg by mouth as needed for moderate pain.   No facility-administered encounter medications on file as of 01/12/2023.    PHYSICAL EXAM: BP Marland Kitchen)  144/90 (BP Location: Left Arm, Patient Position: Sitting, Cuff Size: Small)   Pulse 88   Ht 5\' 1"  (1.549 m) Comment: height measured without shoes  Wt 109 lb 4 oz (49.6 kg)   BMI 20.64 kg/m  General: Well developed white female in no acute distress Head: Normocephalic and atraumatic Eyes:  Sclerae anicteric, conjunctiva pink. Ears: Normal auditory acuity Lungs: No increased WOB Heart: Regular rate Abdomen: Soft, non-distended.  Scars from surgery noted  along with hernia in mid-abdomen as well.  Non-tender.  BS present. Extremities: RLE is bandaged due to cellulitis Neurological: Alert oriented x 4, grossly non-focal Psychological:  Alert and cooperative. Normal mood and affect  Labs 12/2022: CBC unremarkable. BMP unremarkable. CMP with low albumin of 2.8 and normal LFTs. Lipase nml.   CT A/P w/contrast 12/24/22: IMPRESSION: Mildly dilated fluid-filled loops of small bowel diffusely with transition at the level of the distal ileum where there is some fold thickening and stranding. Please correlate for etiology including differential of infectious, inflammatory and ischemic process. Trace free fluid and mesenteric stranding. No free air. Colonic diverticulosis. Mild dilatation of the biliary tree but normal tapering of the common duct towards the pancreatic head. 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  EGD 10/07/22: - Normal esophagus.  - Four gastric polyps. Resected and retrieved.  - Gastritis. Biopsied.  - Normal examined duodenum. Path: 1. Surgical [P], gastric antrum and gastric body - ANTRAL MUCOSA WITH CHEMICAL/REACTIVE GASTROPATHY. - OXYNTIC MUCOSA WITH MILD CHEMICAL/REACTIVE CHANGE. - NO HELICOBACTER PYLORI ORGANISMS IDENTIFIED ON H&E STAINED SLIDE. 2. Surgical [P], gastric polyps-4 - POLYPOID FRAGMENTS OF PREDOMINANTLY OXYNTIC TYPE MUCOSA WITH OVERLAPPING FEATURES OF BOTH A FUNDIC GLAND POLYP AND HYPERPLASTIC POLYP WITH FOCAL EROSION.  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  It is suspected that SBO developed likely due to adhesions from her prior abdominal surgeries.  Patient was able to recover from her most recent SBO without requiring  surgery.  Patient is in a difficult situation because she has history of diverticulitis as well as SBO.  Theoretically a high-fiber diet would be beneficial for prevention of diverticulitis in the future, but a low fiber diet would be beneficial for prevention of SBO in the future.  I asked the patient to look over a low fiber diet and to try to cook the vegetables that she is eating.  Patient has lost weight recently due to her hospitalization.  Thus I encouraged her to try to use some nutritional supplements such as Ensure to help with weight gain.  Patient has had a history of bloating and gas that has responded well to courses of rifaximin therapy.  I also encouraged her to avoid NSAID use, which can increase her risk of developing NSAID related strictures. - Gave handout on low fiber diet - Encourage Ensure 2-3 times per day - Try to avoid NSAID use - RTC 3 months    Eulah Pont, MD  I spent 40 minutes of time, including in depth chart review, independent review of results as outlined above, communicating results with the patient directly, face-to-face time with the patient, coordinating care, ordering studies and medications as appropriate, and documentation.

## 2023-01-12 NOTE — Patient Instructions (Addendum)
Start drinking 2-3 Ensure per day  Avoid NSAID  You are schedule for a follow up visit on 05/04/23 at 10:10 am  If your blood pressure at your visit was 140/90 or greater, please contact your primary care physician to follow up on this.  _______________________________________________________  If you are age 84 or older, your body mass index should be between 23-30. Your Body mass index is 20.64 kg/m. If this is out of the aforementioned range listed, please consider follow up with your Primary Care Provider.  If you are age 61 or younger, your body mass index should be between 19-25. Your Body mass index is 20.64 kg/m. If this is out of the aformentioned range listed, please consider follow up with your Primary Care Provider.   ________________________________________________________  The [facility] providers would like to encourage you to use [facility] to communicate with providers for non-urgent requests or questions.  Due to long hold times on the telephone, sending your provider a message by [facility] may be a faster and more efficient way to get a response.  Please allow 48 business hours for a response.  Please remember that this is for non-urgent requests.  _______________________________________________________   Thank you for entrusting me with your care and for choosing [facility], Dr. Eulah Pont

## 2023-01-13 NOTE — Progress Notes (Unsigned)
Ellen [name] D.[name] [address] [place] [address] Phone: [phone]   Assessment and Plan:     There are no diagnoses linked to this encounter.  ***   Pertinent previous records reviewed include ***   Follow Up: ***     Subjective:   I, Ellen [name], am serving as a Neurosurgeon for Doctor Richardean Sale   Chief Complaint: left hip pain    HPI:    10/21/2021 Patient is a 82 year old female complaining of left hip pain. 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    11/04/2021 Patient states that she is better , "the damn hamstrings still hurt " its more of an annoying pain but better than when she came and saw [place]   12/30/2021 Patient states that she is the same, still hurting     01/20/2022 Patient states that its better than it was but its still there   02/12/2022 Patient states that she is the same    03/19/2022 Patient states that PT is going into a new phase , still has a little pain not as bad    04/16/2022 Patient states she is better not 100% and she doesn't think it will get there    06/04/2022 Patient states she is about 70% better    07/23/2022 Patient states she is still in pain , says its just going to be there    09/03/2022 Patient states she has pain    10/01/2022 Patient states that she still has flares    11/03/2022 Patient states intermittent pain , today the pain is in the hip she cut grass yesterday her garden is fruit full this year    11/24/2022 Patient states CSI provided no relief  "3 weeks of hell"   12/22/2022 Patient states first two weeks  after shot were great but pain came back    01/14/2023 Patient states   Relevant Historical Information: None pertinent  Additional pertinent review of systems negative.   Current Outpatient Medications:    acetaminophen (TYLENOL) 325 MG tablet, Take 650 mg by mouth as needed for moderate pain., Disp: , Rfl:    Calcium Carbonate Antacid (TUMS PO), Take 1 tablet by mouth as needed (reflux)., Disp: , Rfl:    Carboxymethylcellulose Sodium (EYE DROPS OP), Place 2 drops into both eyes daily. Thera Tears, Disp: , Rfl:    doxycycline (VIBRA-TABS) 100 MG tablet, Take 1 tablet (100 mg total) by mouth 2 (two) times daily., Disp: 28 tablet, Rfl: 0   hydrochlorothiazide (MICROZIDE) 12.5 MG capsule, TAKE 1 CAPSULE(12.5 MG) BY MOUTH DAILY, Disp: 90 capsule, Rfl: 1   losartan (COZAAR) 50 MG tablet, TAKE 1 TABLET(50 MG) BY MOUTH DAILY, Disp: 90 tablet, Rfl: 1   pantoprazole (PROTONIX) 40 MG tablet, TAKE 1 TABLET(40 MG) BY MOUTH DAILY (Patient taking differently: Take 40 mg by mouth daily as needed (reflux).), Disp: 90 tablet, Rfl: 0   PREMPRO 0.45-1.5 MG tablet, TAKE 1 TABLET BY MOUTH DAILY (Patient taking differently: Take 1 tablet by mouth every 3 (three) days.), Disp:  28 tablet, Rfl: 0   Objective:     There were no vitals filed for this visit.    There is no height or weight on file to calculate BMI.    Physical Exam:    ***   Electronically signed by:  Ellen [name] D.[name] 12:12 PM 01/13/23

## 2023-01-14 ENCOUNTER — Ambulatory Visit (INDEPENDENT_AMBULATORY_CARE_PROVIDER_SITE_OTHER): Payer: Medicare Other | Admitting: Sports Medicine

## 2023-01-14 VITALS — HR 86 | Ht 61.0 in | Wt 109.0 lb

## 2023-01-14 DIAGNOSIS — M1612 Unilateral primary osteoarthritis, left hip: Secondary | ICD-10-CM

## 2023-01-14 DIAGNOSIS — M25552 Pain in left hip: Secondary | ICD-10-CM

## 2023-01-14 NOTE — Patient Instructions (Addendum)
Tylenol 650 mg 3-4 time a day  As needed follow up  [facility] [phone]

## 2023-01-15 ENCOUNTER — Encounter: Payer: Self-pay | Admitting: Family Medicine

## 2023-01-15 ENCOUNTER — Ambulatory Visit: Payer: Medicare Other | Admitting: Family Medicine

## 2023-01-15 VITALS — BP 128/78 | HR 79 | Temp 97.7°F | Ht 61.0 in | Wt 108.0 lb

## 2023-01-15 DIAGNOSIS — I10 Essential (primary) hypertension: Secondary | ICD-10-CM | POA: Diagnosis not present

## 2023-01-15 DIAGNOSIS — L0390 Cellulitis, unspecified: Secondary | ICD-10-CM

## 2023-01-15 DIAGNOSIS — M7989 Other specified soft tissue disorders: Secondary | ICD-10-CM | POA: Diagnosis not present

## 2023-01-15 DIAGNOSIS — R600 Localized edema: Secondary | ICD-10-CM

## 2023-01-15 NOTE — Patient Instructions (Signed)
It was very nice to see you today!  Please finish your course of doxycycline.  Please try using compression to the area.  Please follow-up with [place] in 1 to 2 weeks if not resolved.  Return in about 2 weeks (around 01/29/2023) for Follow Up.   Take care, Dr Jimmey Ralph  PLEASE NOTE:  If you had any lab tests, please let us know if you have not heard back within a few days. You may see your results on mychart before we have a chance to review them but we will give you a call once they are reviewed by [place].   If we ordered any referrals today, please let us know if you have not heard from their office within the next week.   If you had any urgent prescriptions sent in today, please check with the pharmacy within an hour of our visit to make sure the prescription was transmitted appropriately.   Please try these tips to maintain a healthy lifestyle:  Eat at least 3 REAL meals and 1-2 snacks per day.  Aim for no more than 5 hours between eating.  If you eat breakfast, please do so within one hour of getting up.   Each meal should contain half fruits/vegetables, one quarter protein, and one quarter carbs (no bigger than a computer mouse)  Cut down on sweet beverages. This includes juice, soda, and sweet tea.   Drink at least 1 glass of water with each meal and aim for at least 8 glasses per day  Exercise at least 150 minutes every week.

## 2023-01-15 NOTE — Assessment & Plan Note (Signed)
Blood pressure at goal on losartan 50 mg daily and HCTZ 12.5 mg daily.

## 2023-01-15 NOTE — Assessment & Plan Note (Signed)
Improved since last week as we are treating her cellulitis as above.  She does have compression stockings at home and recommended that she restart these.  We did get dressing supplies to help with weeping.  Discussed with patient that if we can get the excess fluid out of her leg this will also help minimize the weeping.  Also discussed salt avoidance and leg elevation.  She will follow-up in 1 to 2 weeks if not improving.

## 2023-01-15 NOTE — Progress Notes (Signed)
   Ellen [name] is a 82 y.o. female who presents today for an office visit.  Assessment/Plan:  New/Acute Problems: Cellulitis  Improving.  No signs of systemic infection.  She will complete her course of doxycycline and follow up with [place] in 1-2 weeks.   Leg Swelling Related to above cellulitis.  Improving.  Recommended starting compression.  We applied dressing to the area.  Discussed with patient that the leaking when to stop once we get the swelling improved.  She will follow-up with [place] in 1 to 2 weeks.  Chronic Problems Addressed Today: Hypertension Blood pressure at goal on losartan 50 mg daily and HCTZ 12.5 mg daily.  Leg edema Improved since last week as we are treating her cellulitis as above.  She does have compression stockings at home and recommended that she restart these.  We did get dressing supplies to help with weeping.  Discussed with patient that if we can get the excess fluid out of her leg this will also help minimize the weeping.  Also discussed salt avoidance and leg elevation.  She will follow-up in 1 to 2 weeks if not improving.     Subjective:  HPI:  See A/P for status of chronic conditions.  Patient is here for follow-up.  She was seen a week ago for swelling in her leg.  We did obtain a stat Doppler at that time which was negative for DVT.  There was concern for cellulitis and she was started on doxycycline.       Objective:  Physical Exam: BP 128/78   Pulse 79   Temp 97.7 F (36.5 C) (Temporal)   Ht 5\' 1"  (1.549 m)   Wt 108 lb (49 kg)   SpO2 97%   BMI 20.41 kg/m   Gen: No acute distress, resting comfortably MUSCULOSKELETAL: Slight erythema and edema on right lower extremity improved from previous.  See below picture. Neuro: Grossly normal, moves all extremities Psych: Normal affect and thought content        Ellen [name] M. Jimmey Ralph, MD 01/15/2023 9:01 AM

## 2023-01-27 ENCOUNTER — Ambulatory Visit (INDEPENDENT_AMBULATORY_CARE_PROVIDER_SITE_OTHER): Payer: Medicare Other | Admitting: Orthopaedic Surgery

## 2023-01-27 DIAGNOSIS — M25552 Pain in left hip: Secondary | ICD-10-CM

## 2023-01-27 NOTE — Progress Notes (Signed)
The patient is a very pleasant and active 82 year old female sent to me from Dr. Richardean Sale to evaluate her left hip.  She has seen him for some time appropriately as it relates to her left hip over the trochanteric area and has had some injections her left trochanteric area.  Each time she has injection it helps for about a week.  She still denies any groin pain.  She does take care of her husband with peripheral neuropathy and says she really cannot be down much at all as she is having to take care of him.  She is a thin individual.  She is active.  She denies any groin pain at all.  She is going to physical therapy.  She has had this pain over the lateral aspect of her left hip for about a year now.  She does point to the lateral aspect of her hip down to about the mid IT band on that left side.  It is only lateral.  On my exam both hips move smoothly and fluidly with no blocks or rotation and no pain in the groin at all.  She does have pain to palpation over the left hip trochanteric area and proximal IT band only.  I did review plain films from last year and a MRI of her left hip from last year.  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  However on the MRI I definitely see fluid around the insertion of the gluteus medius and minimus tendon and this seems to be where she has most of her pain.  I do not see any subchondral edema in the femoral head or acetabulum and no joint effusion on her left hip.  I would like to send her to my partner Dr. Steward Drone for his opinion as a relates to her left hip and the pain she is having over the trochanteric area.  I have seen patients that do improve with joint replacement surgery for most of this patient is truly have stiffness with her left hip on exam and pain in the groin.  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  However, she may be someone that would benefit from just a minimal intervention along the trochanteric area of her left hip and that is why I feel it is prudent and appropriate for my partner to take a look at her critically to see what his opinion is.  She agrees with this referral.

## 2023-02-04 ENCOUNTER — Ambulatory Visit (INDEPENDENT_AMBULATORY_CARE_PROVIDER_SITE_OTHER): Payer: Medicare Other | Admitting: Family Medicine

## 2023-02-04 ENCOUNTER — Encounter: Payer: Self-pay | Admitting: Family Medicine

## 2023-02-04 VITALS — BP 174/73 | HR 73 | Temp 97.8°F | Ht 61.0 in | Wt 110.6 lb

## 2023-02-04 DIAGNOSIS — I10 Essential (primary) hypertension: Secondary | ICD-10-CM | POA: Diagnosis not present

## 2023-02-04 DIAGNOSIS — L97909 Non-pressure chronic ulcer of unspecified part of unspecified lower leg with unspecified severity: Secondary | ICD-10-CM | POA: Diagnosis not present

## 2023-02-04 DIAGNOSIS — L0390 Cellulitis, unspecified: Secondary | ICD-10-CM

## 2023-02-04 DIAGNOSIS — I83009 Varicose veins of unspecified lower extremity with ulcer of unspecified site: Secondary | ICD-10-CM

## 2023-02-04 DIAGNOSIS — R600 Localized edema: Secondary | ICD-10-CM | POA: Diagnosis not present

## 2023-02-04 MED ORDER — FUROSEMIDE 20 MG PO TABS: 20.0000 mg | ORAL_TABLET | Freq: Every day | ORAL | 3 refills | Status: DC

## 2023-02-04 MED ORDER — DOXYCYCLINE HYCLATE 100 MG PO TABS: 100.0000 mg | ORAL_TABLET | Freq: Two times a day (BID) | ORAL | 0 refills | Status: DC

## 2023-02-04 NOTE — Patient Instructions (Addendum)
It was very nice to see you today!  You have another infection in your legs.  Please start the doxycycline.  Please start the Lasix every day for the next few days.  After your swelling goes down you can then use as needed.  Please keep your open areas clean.  Return in about 1 year (around 02/04/2024).   Take care, Dr Jimmey Ralph  PLEASE NOTE:  If you had any lab tests, please let us know if you have not heard back within a few days. You may see your results on mychart before we have a chance to review them but we will give you a call once they are reviewed by [place].   If we ordered any referrals today, please let us know if you have not heard from their office within the next week.   If you had any urgent prescriptions sent in today, please check with the pharmacy within an hour of our visit to make sure the prescription was transmitted appropriately.   Please try these tips to maintain a healthy lifestyle:  Eat at least 3 REAL meals and 1-2 snacks per day.  Aim for no more than 5 hours between eating.  If you eat breakfast, please do so within one hour of getting up.   Each meal should contain half fruits/vegetables, one quarter protein, and one quarter carbs (no bigger than a computer mouse)  Cut down on sweet beverages. This includes juice, soda, and sweet tea.   Drink at least 1 glass of water with each meal and aim for at least 8 glasses per day  Exercise at least 150 minutes every week.

## 2023-02-04 NOTE — Assessment & Plan Note (Addendum)
Still having ongoing significant issues with this and has developed a few stasis ulcers since our last visit.  She has seen vascular surgery for this and did have a duplex study performed a couple of months ago which was negative for DVT however did show venous reflux.  Given her recurrent issues with the left and now a couple episodes of cellulitis with ulceration would be reasonable for [place] to treat more aggressively.  She has done well with Lasix in the past - will restart this today.  Start 20 mg daily for the next 3 days and then daily as needed afterwards for lower extremity swelling.  She is aware of potential side effects.  She will let us know if this does not cause brisk diuresis and we can adjust dose if needed.  We discussed conservative measures again including leg elevation and compression.  Also discussed salt avoidance.  She will follow-up with me next week though if still has ongoing issues managing this she will need to go back to vascular surgery, especially in light of her recent episodes of cellulitis due to venous stasis ulceration. We discussed reasons to return to care and seek emergent care.

## 2023-02-04 NOTE — Assessment & Plan Note (Signed)
Elevated today.  Well-controlled at last office visit.  May be elevated today in setting of acute illness.  She will continue losartan 50 mg daily and HCTZ 12.5 mg daily.  Will recheck next week at her follow-up visit.

## 2023-02-04 NOTE — Progress Notes (Signed)
Ellen [name] is a 82 y.o. female who presents today for an office visit.  Assessment/Plan:  New/Acute Problems: Cellulitis / Venous stasis ulcer No red flags or signs of systemic infection.  Has recurrent cellulitis secondary to skin breakdown secondary to her venous insufficiency.  Will be addressing her venous insufficiency as below.  For her cellulitis we will restart course of doxycycline 100 mg twice daily for the next 2 weeks.  We discussed reasons to return to care or seek emergent care.  She will follow-up here next week.  Chronic Problems Addressed Today: Leg edema Still having ongoing significant issues with this and has developed a few stasis ulcers since our last visit.  She has seen vascular surgery for this and did have a duplex study performed a couple of months ago which was negative for DVT however did show venous reflux.  Given her recurrent issues with the left and now a couple episodes of cellulitis with ulceration would be reasonable for [place] to treat more aggressively.  She has done well with Lasix in the past - will restart this today.  Start 20 mg daily for the next 3 days and then daily as needed afterwards for lower extremity swelling.  She is aware of potential side effects.  She will let us know if this does not cause brisk diuresis and we can adjust dose if needed.  We discussed conservative measures again including leg elevation and compression.  Also discussed salt avoidance.  She will follow-up with me next week though if still has ongoing issues managing this she will need to go back to vascular surgery, especially in light of her recent episodes of cellulitis due to venous stasis ulceration. We discussed reasons to return to care and seek emergent care.   Hypertension Elevated today.  Well-controlled at last office visit.  May be elevated today in setting of acute illness.  She will continue losartan 50 mg daily and HCTZ 12.5 mg daily.  Will recheck next week  at her follow-up visit.     Subjective:  HPI:  See A/P for status of chronic conditions.  Patient is here today for follow-up.  She was last seen here 3 weeks ago.  At that time we were treating her lower extremity cellulitis and leg swelling with doxycycline.  She did have significant improvement at her visit with me a few weeks ago.  However once she stops her antibiotics she has significant worsening of redness and pain.  This is progressively worsened over the last couple weeks and now she has several areas that she is concerned are infected.  Also notes that her swelling has significantly worsened the last couple of weeks as well.  She did have several blisters pop up on her lower extremities bilaterally.  Those have since popped.  No fevers or chills.  No myalgias.       Objective:  Physical Exam: BP (!) 174/73   Pulse 73   Temp 97.8 F (36.6 C) (Temporal)   Ht 5\' 1"  (1.549 m)   Wt 110 lb 9.6 oz (50.2 kg)   SpO2 98%   BMI 20.90 kg/m   Gen: No acute distress, resting comfortably CV: Regular rate and rhythm with no murmurs appreciated Pulm: Normal work of breathing, clear to auscultation bilaterally with no crackles, wheezes, or rhonchi MUSCULOSKELETAL - Legs: Bilateral lower extremities with chronic venous stasis changes noted.  2+ pitting edema bilaterally.  Several open bulla noted with healthy granulation tissue present.  Dorsalis  pedis pulses 2+ and symmetric bilaterally.  Normal distal cap refill. Skin: Confluent erythematous rash on right lower extremity involving digits and dorsal aspect of foot surrounding a ruptured bulla.  A few other scattered ulcers noted on posterior to lateral malleoli bilaterally serosanguineous drainage present.  No purulent drainage noted.  Very tender to palpation.  Warm to touch. Neuro: Grossly normal, moves all extremities Psych: Normal affect and thought content      Shaden Lacher M. Jimmey Ralph, MD 02/04/2023 8:34 AM

## 2023-02-10 ENCOUNTER — Ambulatory Visit (HOSPITAL_BASED_OUTPATIENT_CLINIC_OR_DEPARTMENT_OTHER): Payer: Medicare Other | Admitting: Orthopaedic Surgery

## 2023-02-10 ENCOUNTER — Ambulatory Visit (HOSPITAL_BASED_OUTPATIENT_CLINIC_OR_DEPARTMENT_OTHER): Payer: Self-pay | Admitting: Orthopaedic Surgery

## 2023-02-10 DIAGNOSIS — M67959 Unspecified disorder of synovium and tendon, unspecified thigh: Secondary | ICD-10-CM

## 2023-02-10 NOTE — Progress Notes (Signed)
Chief Complaint: left hip pain     History of Present Illness:    Ellen [name] is a 82 y.o. female presents with ongoing left hip pain which has been significant for the last year.  She is having a hard time laying directly on the side.  She is very concerned as she has been having weakness and giving way on the side.  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  She has been doing physical therapy for 4 months without significant relief.  She is trying Tylenol as well.  She has previously had an injection without any relief.  She is here today as referral from my partner Dr. 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   Left Buccal Cheek (Keratoacanthoma)   Umbilical hernia    Past Surgical History:  Procedure Laterality Date   BOWEL RESECTION  07/09/2013   Procedure: SMALL BOWEL RESECTION;  Surgeon: Velora Heckler, MD;  Location: [facility];  Service: General;;   LAPAROTOMY N/A 07/09/2013   Procedure: EXPLORATORY LAPAROTOMY;  Surgeon: Velora Heckler, MD;  Location: [facility];  Service: General;  Laterality: N/A;   SHOULDER ARTHROSCOPY WITH CAPSULORRHAPHY     Social History   Socioeconomic History   Marital status: Married    Spouse name: Not on file   Number of children: 0   Years of education: Not on file   Highest education level: Not on file  Occupational History   Occupation: Retired   Tobacco Use   Smoking status: Former    Types: Cigarettes   Smokeless tobacco: Never  Vaping Use   Vaping status: Never Used  Substance and Sexual Activity   Alcohol use: Yes    Alcohol/week: 7.0 standard drinks of alcohol    Types: 7  Glasses of wine per week   Drug use: No   Sexual activity: Not Currently  Other Topics Concern   Not on file  Social History Narrative   Enjoys gardening    Social Determinants of Health   Financial Resource Strain: Low Risk  (07/16/2022)   Overall Financial Resource Strain (CARDIA)    Difficulty of Paying Living Expenses: Not hard at all  Food Insecurity: No Food Insecurity (12/30/2022)   Hunger Vital Sign    Worried About Running Out of Food in the Last Year: Never true    Ran Out of Food in the Last Year: Never true  Transportation Needs: No Transportation Needs (12/30/2022)   PRAPARE - Administrator, Civil Service (Medical): No    Lack of Transportation (Non-Medical): No  Physical Activity: Sufficiently Active (07/16/2022)   Exercise Vital Sign    Days of Exercise per Week: 5 days    Minutes of Exercise per Session: 120 min  Stress: No Stress Concern Present (07/16/2022)   [employer] of Occupational Health - Occupational Stress Questionnaire    Feeling of Stress : Not at all  Social Connections:  Moderately Integrated (07/16/2022)   Social Connection and Isolation Panel [NHANES]    Frequency of Communication with Friends and Family: More than three times a week    Frequency of Social Gatherings with Friends and Family: More than three times a week    Attends Religious Services: More than 4 times per year    Active Member of [employer] or Organizations: No    Attends Engineer, structural: Never    Marital Status: Married   Family History  Problem Relation Age of Onset   Diabetes Mother    Heart attack Mother    CAD Father    Heart attack Father    Breast cancer Neg Hx    Colon cancer Neg Hx    Esophageal cancer Neg Hx    Rectal cancer Neg Hx    Stomach cancer Neg Hx    Allergies  Allergen Reactions   Lactose Intolerance (Gi) Diarrhea   Sulfa Antibiotics Nausea And Vomiting and Swelling   Current Outpatient Medications  Medication Sig Dispense  Refill   acetaminophen (TYLENOL) 325 MG tablet Take 650 mg by mouth as needed for moderate pain.     Calcium Carbonate Antacid (TUMS PO) Take 1 tablet by mouth as needed (reflux).     Carboxymethylcellulose Sodium (EYE DROPS OP) Place 2 drops into both eyes daily. Thera Tears     doxycycline (VIBRA-TABS) 100 MG tablet Take 1 tablet (100 mg total) by mouth 2 (two) times daily. 28 tablet 0   furosemide (LASIX) 20 MG tablet Take 1 tablet (20 mg total) by mouth daily. 30 tablet 3   hydrochlorothiazide (MICROZIDE) 12.5 MG capsule TAKE 1 CAPSULE(12.5 MG) BY MOUTH DAILY 90 capsule 1   losartan (COZAAR) 50 MG tablet TAKE 1 TABLET(50 MG) BY MOUTH DAILY 90 tablet 1   pantoprazole (PROTONIX) 40 MG tablet TAKE 1 TABLET(40 MG) BY MOUTH DAILY (Patient taking differently: Take 40 mg by mouth daily as needed (reflux).) 90 tablet 0   PREMPRO 0.45-1.5 MG tablet TAKE 1 TABLET BY MOUTH DAILY (Patient taking differently: Take 1 tablet by mouth every 3 (three) days.) 28 tablet 0   No current facility-administered medications for this visit.   No results found.  Review of Systems:   A ROS was performed including pertinent positives and negatives as documented in the HPI.  Physical Exam :   Constitutional: NAD and appears stated age Neurological: Alert and oriented Psych: Appropriate affect and cooperative There were no vitals taken for this visit.   Comprehensive Musculoskeletal Exam:    Inspection Right Left  Skin No atrophy or gross abnormalities appreciated No atrophy or gross abnormalities appreciated  Palpation    Tenderness None Lateral trochanter  Crepitus None None  Range of Motion    Flexion (passive) 120 120  Extension 30 30  IR 30 30  ER 45 45  Strength    Flexion  5/5 5/5  Extension 5/5 5/5  Special Tests    FABER Negative Negative  FADIR Negative Negative  ER Lag/Capsular Insufficiency Negative Negative  Instability Negative Negative  Sacroiliac pain Negative  Negative    Instability    Generalized Laxity No No  Neurologic    sciatic, femoral, obturator nerves intact to light sensation  Vascular/Lymphatic    DP pulse 2+ 2+  Lumbar Exam    Patient has symmetric lumbar range of motion with negative pain referral to hip   positive Trendelenburg gait  Imaging:   Xray (3 views left hip): No significant degenerative  findings about the femoral acetabular joint  MRI (left hip): There is a full-thickness tear of the gluteus medius tendon without significant atrophy on T1 view  I personally reviewed and interpreted the radiographs.   Assessment:   82 y.o. 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  At this time I did describe that overall she has had a significant trial of physical therapy as well as an injection.  Given the fact that she is suffering from decreased mobility and what I believe would be an increased fall risk I do believe she would ultimately benefit from a left hip gluteus medius repair with collagen patch augmentation.  We did discuss the risks and limitations as well as the associated recovery.  After discussion of all of her options she is ultimately elected for left hip gluteus medius repair  Plan :    -Plan for left hip gluteus medius repair of collagen patch augmentation   After a lengthy discussion of treatment options, including risks, benefits, alternatives, complications of surgical and nonsurgical conservative options, the patient elected surgical repair.   The patient  is aware of the material risks  and complications including, but not limited to injury to adjacent structures, neurovascular injury, infection, numbness, bleeding, implant failure, thermal burns, stiffness, persistent pain, failure to heal, disease transmission from allograft, need for further surgery, dislocation, anesthetic risks, blood clots, risks of death,and others. The probabilities of surgical success and failure discussed  with patient given their particular co-morbidities.The time and nature of expected rehabilitation and recovery was discussed.The patient's questions were all answered preoperatively.  No barriers to understanding were noted. I explained the natural history of the disease process and Rx rationale.  I explained to the patient what I considered to be reasonable expectations given their personal situation.  The final treatment plan was arrived at through a shared patient decision making process model.       I personally saw and evaluated the patient, and participated in the management and treatment plan.  Huel Cote, MD Attending Physician, Orthopedic Surgery  This document was dictated using Dragon voice recognition software. A reasonable attempt at proof reading has been made to minimize errors.

## 2023-02-11 ENCOUNTER — Ambulatory Visit: Payer: Medicare Other | Admitting: Family Medicine

## 2023-02-11 ENCOUNTER — Encounter: Payer: Self-pay | Admitting: Family Medicine

## 2023-02-11 VITALS — BP 154/76 | HR 73 | Temp 97.3°F | Ht 61.0 in | Wt 109.4 lb

## 2023-02-11 DIAGNOSIS — R600 Localized edema: Secondary | ICD-10-CM | POA: Diagnosis not present

## 2023-02-11 DIAGNOSIS — L0390 Cellulitis, unspecified: Secondary | ICD-10-CM

## 2023-02-11 DIAGNOSIS — I10 Essential (primary) hypertension: Secondary | ICD-10-CM | POA: Diagnosis not present

## 2023-02-11 MED ORDER — MUPIROCIN 2 % EX OINT: 1.0000 | TOPICAL_OINTMENT | Freq: Two times a day (BID) | CUTANEOUS | 3 refills | Status: DC

## 2023-02-11 NOTE — Patient Instructions (Signed)
It was very nice to see you today!  I will send in an antibiotic ointment for you.  Please continue your doxycycline and Lasix.  Return in about 3 weeks (around 03/04/2023).   Take care, Dr Jimmey Ralph  PLEASE NOTE:  If you had any lab tests, please let us know if you have not heard back within a few days. You may see your results on mychart before we have a chance to review them but we will give you a call once they are reviewed by [place].   If we ordered any referrals today, please let us know if you have not heard from their office within the next week.   If you had any urgent prescriptions sent in today, please check with the pharmacy within an hour of our visit to make sure the prescription was transmitted appropriately.   Please try these tips to maintain a healthy lifestyle:  Eat at least 3 REAL meals and 1-2 snacks per day.  Aim for no more than 5 hours between eating.  If you eat breakfast, please do so within one hour of getting up.   Each meal should contain half fruits/vegetables, one quarter protein, and one quarter carbs (no bigger than a computer mouse)  Cut down on sweet beverages. This includes juice, soda, and sweet tea.   Drink at least 1 glass of water with each meal and aim for at least 8 glasses per day  Exercise at least 150 minutes every week.

## 2023-02-11 NOTE — Assessment & Plan Note (Signed)
Slightly above goal per JNC 8.  Continue current regimen losartan 50 mg daily and HCTZ 12.5 mg daily.  She will come back in a few weeks and we will recheck at that time.

## 2023-02-11 NOTE — Progress Notes (Signed)
   Ellen [name] is a 82 y.o. female who presents today for an office visit.  Assessment/Plan:  New/Acute Problems: Cellulitis Improving compared to last week.  She still has 1 week of doxycycline left.  She will complete her course of antibiotics and follow-up with [place] in a few weeks.  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  Has upcoming surgery planned.  Chronic Problems Addressed Today: Leg edema Improving since last visit on Lasix 20 mg daily.  This is causing brisk diuresis.  She is not having any side effects with the Lasix.  She will continue using daily for now until swelling resolves and then can use as needed afterwards.  We did discuss having her follow back up with vascular surgery however hold off on this for now as she is having some improvement with the Lasix.   Discussed importance of reducing and preventing edema in the future especially in light of her recent venous stasis ulcers and subsequent cellulitis.  If she has ongoing issues with severe lower extremity edema despite Lasix above will need to have her follow back up with vascular surgery.  Hypertension Slightly above goal per JNC 8.  Continue current regimen losartan 50 mg daily and HCTZ 12.5 mg daily.  She will come back in a few weeks and we will recheck at that time.     Subjective:  HPI:  See assessment / plan for status of chronic conditions.  Patient is here today for follow-up.  She was seen here 7 days ago for cellulitis.  We restarted doxycycline.  She was also having significant issues with leg edema and we restarted Lasix 20 mg daily for 3 days and then as needed afterwards.  She also saw orthopedics yesterday and was diagnosed with tendinopathy of gluteus medius.  They are planning on doing surgical repair in the next several weeks.       Objective:  Physical Exam: BP (!) 154/76   Pulse 73   Temp (!) 97.3 F (36.3 C) (Temporal)   Ht 5\' 1"  (1.549 m)   Wt 109 lb 6.4  oz (49.6 kg)   SpO2 96%   BMI 20.67 kg/m   Wt Readings from Last 3 Encounters:  02/11/23 109 lb 6.4 oz (49.6 kg)  02/04/23 110 lb 9.6 oz (50.2 kg)  01/15/23 108 lb (49 kg)  Gen: No acute distress, resting comfortably MUSCULOSKELETAL: - Legs: Bilateral lower extremities with chronic venous stasis changes.  1-2+ pitting edema bilaterally.  Healthy granulation tissue present at bilateral open wounds that are improving compared to last week.  Good distal cap refill.  Pulses 2+ and symmetric bilaterally. Skin: Erythematous rash on right lower extremity improving compared to previous exam. Neuro: Grossly normal, moves all extremities Psych: Normal affect and thought content      Ellen Wollam M. Jimmey Ralph, MD 02/11/2023 9:05 AM

## 2023-02-11 NOTE — Assessment & Plan Note (Signed)
Improving since last visit on Lasix 20 mg daily.  This is causing brisk diuresis.  She is not having any side effects with the Lasix.  She will continue using daily for now until swelling resolves and then can use as needed afterwards.  We did discuss having her follow back up with vascular surgery however hold off on this for now as she is having some improvement with the Lasix.   Discussed importance of reducing and preventing edema in the future especially in light of her recent venous stasis ulcers and subsequent cellulitis.  If she has ongoing issues with severe lower extremity edema despite Lasix above will need to have her follow back up with vascular surgery.

## 2023-02-15 ENCOUNTER — Other Ambulatory Visit (HOSPITAL_BASED_OUTPATIENT_CLINIC_OR_DEPARTMENT_OTHER): Payer: Self-pay | Admitting: Student

## 2023-02-15 MED ORDER — MELOXICAM 15 MG PO TABS: 15.0000 mg | ORAL_TABLET | Freq: Every day | ORAL | 0 refills | Status: AC

## 2023-02-16 ENCOUNTER — Other Ambulatory Visit (HOSPITAL_BASED_OUTPATIENT_CLINIC_OR_DEPARTMENT_OTHER): Payer: Self-pay | Admitting: Orthopaedic Surgery

## 2023-02-16 DIAGNOSIS — M67959 Unspecified disorder of synovium and tendon, unspecified thigh: Secondary | ICD-10-CM

## 2023-02-20 ENCOUNTER — Other Ambulatory Visit: Payer: Self-pay | Admitting: Family Medicine

## 2023-02-23 NOTE — Progress Notes (Signed)
Error Please Disregard

## 2023-03-08 ENCOUNTER — Encounter: Payer: Self-pay | Admitting: Family Medicine

## 2023-03-08 ENCOUNTER — Ambulatory Visit (INDEPENDENT_AMBULATORY_CARE_PROVIDER_SITE_OTHER): Payer: Medicare Other | Admitting: Family Medicine

## 2023-03-08 VITALS — BP 147/75 | HR 76 | Temp 97.5°F | Ht 61.0 in | Wt 109.4 lb

## 2023-03-08 DIAGNOSIS — R600 Localized edema: Secondary | ICD-10-CM

## 2023-03-08 DIAGNOSIS — I10 Essential (primary) hypertension: Secondary | ICD-10-CM

## 2023-03-08 MED ORDER — FUROSEMIDE 40 MG PO TABS: 40.0000 mg | ORAL_TABLET | Freq: Every day | ORAL | 5 refills | Status: DC

## 2023-03-08 NOTE — Patient Instructions (Addendum)
It was very nice to see you today!  Please increase your lasix to 40 mg daily. Please continue with the compression stockings and leg elevation.   Let me know how you are doing in a couple of weeks.   Return if symptoms worsen or fail to improve.   Take care, Dr Jimmey Ralph  PLEASE NOTE:  If you had any lab tests, please let us know if you have not heard back within a few days. You may see your results on mychart before we have a chance to review them but we will give you a call once they are reviewed by [place].   If we ordered any referrals today, please let us know if you have not heard from their office within the next week.   If you had any urgent prescriptions sent in today, please check with the pharmacy within an hour of our visit to make sure the prescription was transmitted appropriately.   Please try these tips to maintain a healthy lifestyle:  Eat at least 3 REAL meals and 1-2 snacks per day.  Aim for no more than 5 hours between eating.  If you eat breakfast, please do so within one hour of getting up.   Each meal should contain half fruits/vegetables, one quarter protein, and one quarter carbs (no bigger than a computer mouse)  Cut down on sweet beverages. This includes juice, soda, and sweet tea.   Drink at least 1 glass of water with each meal and aim for at least 8 glasses per day  Exercise at least 150 minutes every week.

## 2023-03-08 NOTE — Assessment & Plan Note (Signed)
At goal per JNC 8. Continue losartan 50 mg daily and hydrochlorothiazide 12.5 mg daily.

## 2023-03-08 NOTE — Progress Notes (Signed)
   Ellen [name] is a 82 y.o. female who presents today for an office visit.  Assessment/Plan:  Chronic Problems Addressed Today: Leg edema Her edema is improving though still has a fair amount of fluid retention. She does have a few open areas that is improving. No signs of cellulitis or infection today. Do not think she needs any more antibiotics at this point.  We will increase her lasix to 40 mg daily. She will continue with conservative measures including compression stockings and leg elevation.   She will follow up with [place] in a couple of weeks.   IF she still has ongoing issues despite lasix we will need to have her follow back up with vascular surgery.   Hypertension At goal per JNC 8. Continue losartan 50 mg daily and hydrochlorothiazide 12.5 mg daily.      Subjective:  HPI:  See Assessment / plan for status of chronic conditions. Patient is here today for follow up.  We last saw her a few weeks ago. At that time she was having ongoing issues with cellulitis and leg edema. We continued her on doxycycline and lasix.  She has completed her course of doxycycline. Her swelling has improved significantly though still is present. Her current dose of lasix does make her urinate but feels like it could be a bit stronger. She does have a few open areas that are healing.        Objective:  Physical Exam: BP (!) 147/75   Pulse 76   Temp (!) 97.5 F (36.4 C) (Temporal)   Ht 5\' 1"  (1.549 m)   Wt 109 lb 6.4 oz (49.6 kg)   SpO2 97%   BMI 20.67 kg/m   Wt Readings from Last 3 Encounters:  03/08/23 109 lb 6.4 oz (49.6 kg)  02/11/23 109 lb 6.4 oz (49.6 kg)  02/04/23 110 lb 9.6 oz (50.2 kg)    Gen: No acute distress, resting comfortably CV: Regular rate and rhythm with no murmurs appreciated Pulm: Normal work of breathing, clear to auscultation bilaterally with no crackles, wheezes, or rhonchi MUSCULOSKELETAL: - Legs: 2+ pitting edema bilaterally. A few open bulla that are  healing. No surrounding erythema or drainage.  Neuro: Grossly normal, moves all extremities Psych: Normal affect and thought content      Nyonna Hargrove M. Jimmey Ralph, MD 03/08/2023 2:49 PM

## 2023-03-08 NOTE — Assessment & Plan Note (Addendum)
Her edema is improving though still has a fair amount of fluid retention. She does have a few open areas that is improving. No signs of cellulitis or infection today. Do not think she needs any more antibiotics at this point.  We will increase her lasix to 40 mg daily. She will continue with conservative measures including compression stockings and leg elevation.   She will follow up with [place] in a couple of weeks.   IF she still has ongoing issues despite lasix we will need to have her follow back up with vascular surgery.

## 2023-03-15 ENCOUNTER — Telehealth (HOSPITAL_BASED_OUTPATIENT_CLINIC_OR_DEPARTMENT_OTHER): Payer: Self-pay

## 2023-03-15 ENCOUNTER — Encounter (HOSPITAL_BASED_OUTPATIENT_CLINIC_OR_DEPARTMENT_OTHER): Payer: Self-pay | Admitting: Orthopaedic Surgery

## 2023-03-15 ENCOUNTER — Other Ambulatory Visit: Payer: Self-pay

## 2023-03-15 NOTE — Progress Notes (Signed)
Reviewed chart with Dr Lilli Light (anesthesia) d/t recent bowel obstruction and other concerns. He has no concerns from anesthesia standpoint. Will call Dr Serena Croissant office re: other issues (active cellulitis B ankles using mupiricin; pt's husband uses walker and may be unable to help her post op... he is her only option for caregiver). Patient was also going to reach out to Dr Serena Croissant office.

## 2023-03-15 NOTE — Progress Notes (Signed)
LM on VM for Dr Serena Croissant surgery scheduler re: cellulitis and caregiver concerns (see previous note). Awaiting a return call to discuss these issues.

## 2023-03-15 NOTE — Progress Notes (Signed)
   03/15/23 1227  Pre-op Phone Call  Surgery Date Verified 03/22/23  Arrival Time Verified 0930  Surgery Location Verified [facility] [place]  Medical History Reviewed Yes  Is the patient taking a GLP-1 receptor agonist? No  Does the patient have diabetes? No diagnosis of diabetes  Do you have a history of heart problems? No  Antiarrhythmic device type  (NA)  Does patient have other implanted devices? No  Patient Teaching Pre / Post Procedure  Patient educated about smoking cessation 24 hours prior to surgery. N/A Non-Smoker  Patient verbalizes understanding of bowel prep? N/A  Med Rec Completed Yes  Take the Following Meds the Morning of Surgery no meds AM of sx  Recent  Lab Work, EKG, CXR? No  NPO (Including gum & candy) After midnight  Patient instructed to stop clear liquids including Carb loading drink at: 0730  Stop Solids, Milk, Candy, and Gum STARTING AT MIDNIGHT  Responsible adult to drive and be with you for 24 hours? (S)  Yes  Name & Phone Number for Ride/Caregiver (S)  husband uses walker; pt will arrange for help for him and her post op (pt will call Dr Thereasa Solo office; she states they've discussed this)  No Jewelry, money, nail polish or make-up.  No lotions, powders, perfumes. No shaving  48 hrs. prior to surgery. Yes  Contacts, Dentures & Glasses Will Have to be Removed Before OR. Yes  Please bring your ID and Insurance Card the morning of your surgery. (Surgery Centers Only) Yes  Bring any papers or x-rays with you that your surgeon gave you. Yes  Instructed to contact the location of procedure/ provider if they or anyone in their household develops symptoms or tests positive for COVID-19, has close contact with someone who tests positive for COVID, or has known exposure to any contagious illness. Yes  Call this number the morning of surgery  with any problems that may cancel your surgery. [phone]  Covid-19 Assessment  Have you had a positive COVID-19 test within the previous  90 days? No  COVID Testing Guidance Proceed with the additional questions.  Patient's surgery required a COVID-19 test (cardiothoracic, complex ENT, and bronchoscopies/ EBUS) No  Have you been unmasked and in close contact with anyone with COVID-19 or COVID-19 symptoms within the past 10 days? No  Do you or anyone in your household currently have any COVID-19 symptoms? No

## 2023-03-15 NOTE — Telephone Encounter (Signed)
Pt came into the office wanting to be called back.stated she has more questions about upcoming surgery

## 2023-03-15 NOTE — Progress Notes (Signed)
Spoke with April (Dr Serena Croissant surgery scheduler) about pt's continued "cellulitis" and mupiricin use. Also spoke discussed concerns with her after care at home since husband is using walker. April said they may need to send patient to rehab care after surgery. She will speak with Dr Steward Drone and contact patient with changes that need to be made.

## 2023-03-16 ENCOUNTER — Encounter (HOSPITAL_BASED_OUTPATIENT_CLINIC_OR_DEPARTMENT_OTHER): Payer: Self-pay | Admitting: Orthopaedic Surgery

## 2023-03-16 NOTE — Telephone Encounter (Signed)
HHPT ordered and confirmed with [facility]

## 2023-03-17 ENCOUNTER — Encounter (HOSPITAL_BASED_OUTPATIENT_CLINIC_OR_DEPARTMENT_OTHER)
Admission: RE | Admit: 2023-03-17 | Discharge: 2023-03-17 | Disposition: A | Discharge status: Home / Self Care | Payer: Medicare Other | Source: Ambulatory Visit | Attending: Orthopaedic Surgery | Admitting: Orthopaedic Surgery

## 2023-03-17 DIAGNOSIS — Z01812 Encounter for preprocedural laboratory examination: Secondary | ICD-10-CM | POA: Insufficient documentation

## 2023-03-17 LAB — BASIC METABOLIC PANEL
Anion gap: 13 (ref 5–15)
BUN: 23 mg/dL (ref 8–23)
CO2: 27 mmol/L (ref 22–32)
Calcium: 9.5 mg/dL (ref 8.9–10.3)
Chloride: 98 mmol/L (ref 98–111)
Creatinine, Ser: 0.65 mg/dL (ref 0.44–1.00)
GFR, Estimated: >60 mL/min (ref >60)
Glucose, Bld: 129 mg/dL — ABNORMAL HIGH (ref 70–99)
Potassium: 3.7 mmol/L (ref 3.5–5.1)
Sodium: 138 mmol/L (ref 135–145)

## 2023-03-17 NOTE — Progress Notes (Signed)
Patient given presurgical soap and presurgical CHG wash. Education provided and patient verbalized understanding.

## 2023-03-19 NOTE — Progress Notes (Signed)
Spoke with April (scheduler for Dr Steward Drone) who reports surgeon will assess pt's cellulitis on day of surgery. Office reports they have also arranged 2 week rehab for patient at a facility. April states patient has arranged transportation to this facility.

## 2023-03-22 ENCOUNTER — Ambulatory Visit (HOSPITAL_BASED_OUTPATIENT_CLINIC_OR_DEPARTMENT_OTHER)
Admission: RE | Admit: 2023-03-22 | Discharge: 2023-03-22 | Disposition: A | Discharge status: Home / Self Care | Payer: Medicare Other | Attending: Orthopaedic Surgery | Admitting: Orthopaedic Surgery

## 2023-03-22 ENCOUNTER — Ambulatory Visit (HOSPITAL_BASED_OUTPATIENT_CLINIC_OR_DEPARTMENT_OTHER): Payer: Medicare Other | Admitting: Anesthesiology

## 2023-03-22 ENCOUNTER — Other Ambulatory Visit: Payer: Self-pay

## 2023-03-22 ENCOUNTER — Encounter (HOSPITAL_BASED_OUTPATIENT_CLINIC_OR_DEPARTMENT_OTHER)
Admission: RE | Disposition: A | Discharge status: Home / Self Care | Payer: Self-pay | Source: Home / Self Care | Attending: Orthopaedic Surgery

## 2023-03-22 ENCOUNTER — Encounter (HOSPITAL_BASED_OUTPATIENT_CLINIC_OR_DEPARTMENT_OTHER): Payer: Self-pay | Admitting: Orthopaedic Surgery

## 2023-03-22 DIAGNOSIS — K219 Gastro-esophageal reflux disease without esophagitis: Secondary | ICD-10-CM | POA: Diagnosis not present

## 2023-03-22 DIAGNOSIS — S76012A Strain of muscle, fascia and tendon of left hip, initial encounter: Secondary | ICD-10-CM | POA: Diagnosis not present

## 2023-03-22 DIAGNOSIS — X58XXXA Exposure to other specified factors, initial encounter: Secondary | ICD-10-CM | POA: Diagnosis not present

## 2023-03-22 DIAGNOSIS — M67952 Unspecified disorder of synovium and tendon, left thigh: Secondary | ICD-10-CM

## 2023-03-22 DIAGNOSIS — Z79899 Other long term (current) drug therapy: Secondary | ICD-10-CM | POA: Diagnosis not present

## 2023-03-22 DIAGNOSIS — Z419 Encounter for procedure for purposes other than remedying health state, unspecified: Secondary | ICD-10-CM

## 2023-03-22 DIAGNOSIS — I10 Essential (primary) hypertension: Secondary | ICD-10-CM | POA: Insufficient documentation

## 2023-03-22 DIAGNOSIS — Z87891 Personal history of nicotine dependence: Secondary | ICD-10-CM | POA: Insufficient documentation

## 2023-03-22 DIAGNOSIS — S76312A Strain of muscle, fascia and tendon of the posterior muscle group at thigh level, left thigh, initial encounter: Secondary | ICD-10-CM | POA: Diagnosis not present

## 2023-03-22 DIAGNOSIS — M67959 Unspecified disorder of synovium and tendon, unspecified thigh: Secondary | ICD-10-CM

## 2023-03-22 HISTORY — PX: GLUTEUS MINIMUS REPAIR: SHX5843

## 2023-03-22 SURGERY — REPAIR, TENDON, GLUTEUS MINIMUS
Anesthesia: General | Site: Buttocks | Laterality: Left

## 2023-03-22 MED ORDER — LIDOCAINE HCL (CARDIAC) PF 100 MG/5ML IV SOSY
PREFILLED_SYRINGE | INTRAVENOUS | Status: DC | PRN
  Administered 2023-03-22: 60 mg via INTRAVENOUS

## 2023-03-22 MED ORDER — FENTANYL CITRATE (PF) 100 MCG/2ML IJ SOLN
INTRAMUSCULAR | Status: AC
  Filled 2023-03-22: qty 2

## 2023-03-22 MED ORDER — ACETAMINOPHEN 500 MG PO TABS: 500.0000 mg | ORAL_TABLET | Freq: Three times a day (TID) | ORAL | 0 refills | Status: AC

## 2023-03-22 MED ORDER — BUPIVACAINE-EPINEPHRINE (PF) 0.25% -1:200000 IJ SOLN
INTRAMUSCULAR | Status: AC
  Filled 2023-03-22: qty 30

## 2023-03-22 MED ORDER — PHENYLEPHRINE HCL (PRESSORS) 10 MG/ML IV SOLN
INTRAVENOUS | Status: DC | PRN
  Administered 2023-03-22 (×2): 80 ug via INTRAVENOUS

## 2023-03-22 MED ORDER — ONDANSETRON HCL 4 MG/2ML IJ SOLN: 4.0000 mg | Freq: Once | INTRAMUSCULAR | Status: DC | PRN

## 2023-03-22 MED ORDER — ACETAMINOPHEN 500 MG PO TABS
ORAL_TABLET | ORAL | Status: AC
Start: 2023-03-22 — End: ?
  Filled 2023-03-22: qty 2

## 2023-03-22 MED ORDER — DEXAMETHASONE SODIUM PHOSPHATE 4 MG/ML IJ SOLN
INTRAMUSCULAR | Status: DC | PRN
  Administered 2023-03-22: 4 mg via INTRAVENOUS

## 2023-03-22 MED ORDER — ROCURONIUM BROMIDE 100 MG/10ML IV SOLN
INTRAVENOUS | Status: DC | PRN
  Administered 2023-03-22: 40 mg via INTRAVENOUS

## 2023-03-22 MED ORDER — ACETAMINOPHEN 325 MG PO TABS
ORAL_TABLET | ORAL | Status: AC
Start: 2023-03-22 — End: ?
  Filled 2023-03-22: qty 2

## 2023-03-22 MED ORDER — SODIUM CHLORIDE 0.9 % IV SOLN: INTRAVENOUS | Status: DC | PRN

## 2023-03-22 MED ORDER — ACETAMINOPHEN 325 MG PO TABS
650.0000 mg | ORAL_TABLET | Freq: Once | ORAL | Status: AC
  Administered 2023-03-22: 650 mg via ORAL

## 2023-03-22 MED ORDER — SUGAMMADEX SODIUM 200 MG/2ML IV SOLN
INTRAVENOUS | Status: DC | PRN
  Administered 2023-03-22: 200 mg via INTRAVENOUS

## 2023-03-22 MED ORDER — TRANEXAMIC ACID-NACL 1000-0.7 MG/100ML-% IV SOLN
1000.0000 mg | INTRAVENOUS | Status: AC
  Administered 2023-03-22: 1000 mg via INTRAVENOUS

## 2023-03-22 MED ORDER — CEFAZOLIN SODIUM-DEXTROSE 2-4 GM/100ML-% IV SOLN
INTRAVENOUS | Status: AC
Start: 2023-03-22 — End: ?
  Filled 2023-03-22: qty 100

## 2023-03-22 MED ORDER — GABAPENTIN 300 MG PO CAPS
ORAL_CAPSULE | ORAL | Status: AC
  Filled 2023-03-22: qty 1

## 2023-03-22 MED ORDER — AMISULPRIDE (ANTIEMETIC) 5 MG/2ML IV SOLN: 10.0000 mg | Freq: Once | INTRAVENOUS | Status: DC | PRN

## 2023-03-22 MED ORDER — ASPIRIN 325 MG PO TBEC: 325.0000 mg | DELAYED_RELEASE_TABLET | Freq: Every day | ORAL | 0 refills | Status: DC

## 2023-03-22 MED ORDER — PROPOFOL 10 MG/ML IV BOLUS
INTRAVENOUS | Status: DC | PRN
  Administered 2023-03-22: 150 mg via INTRAVENOUS

## 2023-03-22 MED ORDER — ACETAMINOPHEN 500 MG PO TABS: 1000.0000 mg | ORAL_TABLET | Freq: Once | ORAL | Status: DC

## 2023-03-22 MED ORDER — BUPIVACAINE HCL 0.25 % IJ SOLN
INTRAMUSCULAR | Status: DC | PRN
  Administered 2023-03-22: 10 mL

## 2023-03-22 MED ORDER — FENTANYL CITRATE (PF) 100 MCG/2ML IJ SOLN
INTRAMUSCULAR | Status: DC | PRN
  Administered 2023-03-22 (×2): 50 ug via INTRAVENOUS

## 2023-03-22 MED ORDER — OXYCODONE HCL 5 MG PO TABS: 5.0000 mg | ORAL_TABLET | ORAL | 0 refills | Status: DC | PRN

## 2023-03-22 MED ORDER — TRANEXAMIC ACID-NACL 1000-0.7 MG/100ML-% IV SOLN
INTRAVENOUS | Status: AC
  Filled 2023-03-22: qty 100

## 2023-03-22 MED ORDER — FENTANYL CITRATE (PF) 100 MCG/2ML IJ SOLN
25.0000 ug | INTRAMUSCULAR | Status: DC | PRN
Start: 2023-03-22 — End: 2023-03-22
  Administered 2023-03-22 (×2): 50 ug via INTRAVENOUS

## 2023-03-22 MED ORDER — CEFAZOLIN SODIUM-DEXTROSE 2-4 GM/100ML-% IV SOLN
2.0000 g | INTRAVENOUS | Status: AC
  Administered 2023-03-22: 2 g via INTRAVENOUS

## 2023-03-22 MED ORDER — GABAPENTIN 300 MG PO CAPS
300.0000 mg | ORAL_CAPSULE | Freq: Once | ORAL | Status: AC
  Administered 2023-03-22: 300 mg via ORAL

## 2023-03-22 SURGICAL SUPPLY — 59 items
ADH SKN CLS APL DERMABOND .7 (GAUZE/BANDAGES/DRESSINGS)
ANCH SUT KNTLS STRL SHLDR SYS (Anchor) ×2 IMPLANT
ANCHOR JUGGERKNOT SOFT 1.5 (Anchor) ×2 IMPLANT
ANCHOR JUGGERKNOT SOFT 2.9 (Anchor) IMPLANT
ANCHOR SUT QUATTRO KNTLS 4.5 (Anchor) IMPLANT
APL PRP STRL LF DISP 70% ISPRP (MISCELLANEOUS) ×1
BLADE SURG 10 STRL SS (BLADE) ×1 IMPLANT
BLADE SURG 15 STRL LF DISP TIS (BLADE) ×1 IMPLANT
BLADE SURG 15 STRL SS (BLADE) ×1
CANISTER SUCT 1200ML W/VALVE (MISCELLANEOUS) ×1 IMPLANT
CHLORAPREP W/TINT 26 (MISCELLANEOUS) ×1 IMPLANT
CLSR STERI-STRIP ANTIMIC 1/2X4 (GAUZE/BANDAGES/DRESSINGS) IMPLANT
COOLER ICEMAN CLASSIC (MISCELLANEOUS) ×1 IMPLANT
COVER BACK TABLE 60X90IN (DRAPES) ×1 IMPLANT
COVER MAYO STAND STRL (DRAPES) ×1 IMPLANT
DERMABOND ADVANCED .7 DNX12 (GAUZE/BANDAGES/DRESSINGS) IMPLANT
DRAPE STERI IOBAN 125X83 (DRAPES) ×1 IMPLANT
DRAPE U-SHAPE 47X51 STRL (DRAPES) ×2 IMPLANT
DRSG AQUACEL AG ADV 3.5X 6 (GAUZE/BANDAGES/DRESSINGS) ×1 IMPLANT
DRSG AQUACEL AG ADV 3.5X10 (GAUZE/BANDAGES/DRESSINGS) IMPLANT
ELECT BLADE 4.0 EZ CLEAN MEGAD (MISCELLANEOUS)
ELECT REM PT RETURN 9FT ADLT (ELECTROSURGICAL) ×1
ELECTRODE BLDE 4.0 EZ CLN MEGD (MISCELLANEOUS) IMPLANT
ELECTRODE REM PT RTRN 9FT ADLT (ELECTROSURGICAL) ×1 IMPLANT
GAUZE PAD ABD 8X10 STRL (GAUZE/BANDAGES/DRESSINGS) IMPLANT
GAUZE XEROFORM 1X8 LF (GAUZE/BANDAGES/DRESSINGS) IMPLANT
GLOVE BIO SURGEON STRL SZ 6 (GLOVE) ×2 IMPLANT
GLOVE BIO SURGEON STRL SZ7.5 (GLOVE) ×2 IMPLANT
GLOVE BIOGEL PI IND STRL 6.5 (GLOVE) ×1 IMPLANT
GLOVE BIOGEL PI IND STRL 8 (GLOVE) ×1 IMPLANT
GOWN STRL REUS W/ TWL LRG LVL3 (GOWN DISPOSABLE) ×2 IMPLANT
GOWN STRL REUS W/TWL LRG LVL3 (GOWN DISPOSABLE) ×2
GOWN STRL REUS W/TWL XL LVL3 (GOWN DISPOSABLE) ×1 IMPLANT
IMPL TAPESTRY BIOINTEGR 40X30 (Mesh General) IMPLANT
MANIFOLD NEPTUNE II (INSTRUMENTS) ×1 IMPLANT
NDL HYPO 22X1.5 SAFETY MO (MISCELLANEOUS) ×1 IMPLANT
NDL HYPO 25X1 1.5 SAFETY (NEEDLE) IMPLANT
NEEDLE HYPO 22X1.5 SAFETY MO (MISCELLANEOUS) ×1 IMPLANT
NEEDLE HYPO 25X1 1.5 SAFETY (NEEDLE) ×1 IMPLANT
NS IRRIG 1000ML POUR BTL (IV SOLUTION) ×1 IMPLANT
PACK BASIN DAY SURGERY FS (CUSTOM PROCEDURE TRAY) ×1 IMPLANT
PAD COLD SHLDR WRAP-ON (PAD) ×1 IMPLANT
PENCIL SMOKE EVACUATOR (MISCELLANEOUS) ×1 IMPLANT
SLEEVE SCD COMPRESS KNEE MED (STOCKING) ×1 IMPLANT
SPIKE FLUID TRANSFER (MISCELLANEOUS) IMPLANT
SPONGE T-LAP 18X18 ~~LOC~~+RFID (SPONGE) ×1 IMPLANT
SUCTION TUBE FRAZIER 10FR DISP (SUCTIONS) ×1 IMPLANT
SUT ETHILON 3 0 PS 1 (SUTURE) IMPLANT
SUT MNCRL AB 3-0 PS2 27 (SUTURE) IMPLANT
SUT VIC AB 0 CT1 27 (SUTURE) ×1
SUT VIC AB 0 CT1 27XBRD ANBCTR (SUTURE) ×1 IMPLANT
SUT VIC AB 2-0 CT1 27 (SUTURE) ×2
SUT VIC AB 2-0 CT1 TAPERPNT 27 (SUTURE) ×1 IMPLANT
SYR 20ML LL LF (SYRINGE) ×1 IMPLANT
SYR BULB EAR ULCER 3OZ GRN STR (SYRINGE) ×1 IMPLANT
SYR CONTROL 10ML LL (SYRINGE) IMPLANT
TOWEL GREEN STERILE FF (TOWEL DISPOSABLE) ×2 IMPLANT
UNDERPAD 30X36 HEAVY ABSORB (UNDERPADS AND DIAPERS) IMPLANT
YANKAUER SUCT BULB TIP NO VENT (SUCTIONS) ×1 IMPLANT

## 2023-03-22 NOTE — Progress Notes (Signed)
When asked about rehab post surgery, patient states she does not need to have rehab and husband will care for patient post op. Will notify surgeon

## 2023-03-22 NOTE — Transfer of Care (Signed)
Immediate Anesthesia Transfer of Care Note  Patient: Ellen [name]  Procedure(s) Performed: LEFT GLUTEUS MEDIUS REPAIR WITH POSSIBLE COLLAGEN PATCH AUGMENTATION (Left: Buttocks)  Patient Location: PACU  Anesthesia Type:General  Level of Consciousness: awake, alert , and oriented  Airway & Oxygen Therapy: Patient Spontanous Breathing and Patient connected to face mask oxygen  Post-op Assessment: Report given to RN and Post -op Vital signs reviewed and stable  Post vital signs: Reviewed and stable  Last Vitals:  Vitals Value Taken Time  BP 176/93 03/22/23 1318  Temp    Pulse 78 03/22/23 1322  Resp 12 03/22/23 1322  SpO2 100 % 03/22/23 1322  Vitals shown include unfiled device data.  Last Pain:  Vitals:   03/22/23 1002  TempSrc: Temporal  PainSc: 0-No pain      Patients Stated Pain Goal: 5 (03/22/23 1002)  Complications: No notable events documented.

## 2023-03-22 NOTE — Brief Op Note (Signed)
   Brief Op Note  Date of Surgery: 03/22/2023  Preoperative Diagnosis: LEFT GLUTEUS MEDIUS TEAR  Postoperative Diagnosis: same  Procedure: Procedure(s): LEFT GLUTEUS MEDIUS REPAIR WITH POSSIBLE COLLAGEN PATCH AUGMENTATION  Implants: Implant Name Type Inv. Item Serial No. Manufacturer Lot No. LRB No. Used Action  ANCHOR JUGGERKNOT SOFT 1.5 - [id number] Anchor ANCHOR JUGGERKNOT SOFT 1.5  ZIMMER RECON(ORTH,TRAU,BIO,SG) [id number] Left 2 Implanted  [employer] SUT KNTLS STRL SHLDR SYS - [id number] Anchor ANCH SUT KNTLS STRL SHLDR SYS  ZIMMER RECON(ORTH,TRAU,BIO,SG) [id number] Left 2 Implanted  IMPL TAPESTRY BIOINTEGR 40X30 - [id number] Mesh General IMPL TAPESTRY BIOINTEGR 40X30  ZIMMER RECON(ORTH,TRAU,BIO,SG) [id number] Left 1 Implanted    Surgeons: Surgeon(s): Huel Cote, MD  Anesthesia: General    Estimated Blood Loss: See anesthesia record  Complications: None  Condition to PACU: Stable  Benancio Deeds, MD 03/22/2023 12:59 PM

## 2023-03-22 NOTE — H&P (Signed)
Chief Complaint: left hip pain        History of Present Illness:      Ellen [name] is a 82 y.o. female presents with ongoing left hip pain which has been significant for the last year.  She is having a hard time laying directly on the side.  She is very concerned as she has been having weakness and giving way on the side.  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  She has been doing physical therapy for 4 months without significant relief.  She is trying Tylenol as well.  She has previously had an injection without any relief.  She is here today as referral from my partner Dr. 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    Left Buccal Cheek (Keratoacanthoma)   Umbilical hernia               Past Surgical History:  Procedure Laterality Date   BOWEL RESECTION   07/09/2013    Procedure: SMALL BOWEL RESECTION;  Surgeon: Velora Heckler, MD;  Location: [facility];  Service: General;;   LAPAROTOMY N/A 07/09/2013    Procedure: EXPLORATORY LAPAROTOMY;  Surgeon: Velora Heckler, MD;  Location: [facility];  Service: General;  Laterality: N/A;   SHOULDER ARTHROSCOPY WITH CAPSULORRHAPHY            Social History         Socioeconomic History   Marital status: Married      Spouse name: Not on file   Number of children: 0   Years of education: Not on file   Highest education level: Not on file  Occupational History   Occupation: Retired   Tobacco Use   Smoking status: Former      Types: Cigarettes   Smokeless tobacco: Never  Vaping Use   Vaping status: Never Used  Substance and Sexual Activity   Alcohol use: Yes      Alcohol/week: 7.0  standard drinks of alcohol      Types: 7 Glasses of wine per week   Drug use: No   Sexual activity: Not Currently  Other Topics Concern   Not on file  Social History Narrative    Enjoys gardening     Social Determinants of Health        Financial Resource Strain: Low Risk  (07/16/2022)    Overall Financial Resource Strain (CARDIA)     Difficulty of Paying Living Expenses: Not hard at all  Food Insecurity: No Food Insecurity (12/30/2022)    Hunger Vital Sign     Worried About Running Out of Food in the Last Year: Never true     Ran Out of Food in the Last Year: Never true  Transportation Needs: No Transportation Needs (12/30/2022)    PRAPARE - Therapist, art (Medical): No     Lack of Transportation (Non-Medical): No  Physical Activity: Sufficiently Active (07/16/2022)    Exercise Vital Sign  Days of Exercise per Week: 5 days     Minutes of Exercise per Session: 120 min  Stress: No Stress Concern Present (07/16/2022)    [employer] of Occupational Health - Occupational Stress Questionnaire     Feeling of Stress : Not at all  Social Connections: Moderately Integrated (07/16/2022)    Social Connection and Isolation Panel [NHANES]     Frequency of Communication with Friends and Family: More than three times a week     Frequency of Social Gatherings with Friends and Family: More than three times a week     Attends Religious Services: More than 4 times per year     Active Member of [employer] or Organizations: No     Attends Engineer, structural: Never     Marital Status: Married         Family History  Problem Relation Age of Onset   Diabetes Mother     Heart attack Mother     CAD Father     Heart attack Father     Breast cancer Neg Hx     Colon cancer Neg Hx     Esophageal cancer Neg Hx     Rectal cancer Neg Hx     Stomach cancer Neg Hx          Allergies      Allergies  Allergen Reactions   Lactose Intolerance (Gi) Diarrhea    Sulfa Antibiotics Nausea And Vomiting and Swelling            Current Outpatient Medications  Medication Sig Dispense Refill   acetaminophen (TYLENOL) 325 MG tablet Take 650 mg by mouth as needed for moderate pain.       Calcium Carbonate Antacid (TUMS PO) Take 1 tablet by mouth as needed (reflux).       Carboxymethylcellulose Sodium (EYE DROPS OP) Place 2 drops into both eyes daily. Thera Tears       doxycycline (VIBRA-TABS) 100 MG tablet Take 1 tablet (100 mg total) by mouth 2 (two) times daily. 28 tablet 0   furosemide (LASIX) 20 MG tablet Take 1 tablet (20 mg total) by mouth daily. 30 tablet 3   hydrochlorothiazide (MICROZIDE) 12.5 MG capsule TAKE 1 CAPSULE(12.5 MG) BY MOUTH DAILY 90 capsule 1   losartan (COZAAR) 50 MG tablet TAKE 1 TABLET(50 MG) BY MOUTH DAILY 90 tablet 1   pantoprazole (PROTONIX) 40 MG tablet TAKE 1 TABLET(40 MG) BY MOUTH DAILY (Patient taking differently: Take 40 mg by mouth daily as needed (reflux).) 90 tablet 0   PREMPRO 0.45-1.5 MG tablet TAKE 1 TABLET BY MOUTH DAILY (Patient taking differently: Take 1 tablet by mouth every 3 (three) days.) 28 tablet 0      No current facility-administered medications for this visit.      Imaging Results (Last 48 hours)  No results found.     Review of Systems:   A ROS was performed including pertinent positives and negatives as documented in the HPI.   Physical Exam :   Constitutional: NAD and appears stated age Neurological: Alert and oriented Psych: Appropriate affect and cooperative There were no vitals taken for this visit.    Comprehensive Musculoskeletal Exam:     Inspection Right Left  Skin No atrophy or gross abnormalities appreciated No atrophy or gross abnormalities appreciated  Palpation      Tenderness None Lateral trochanter  Crepitus None None  Range of Motion      Flexion (passive) 120 120  Extension  30 30  IR 30 30  ER 45 45  Strength      Flexion  5/5 5/5  Extension 5/5 5/5  Special Tests       FABER Negative Negative  FADIR Negative Negative  ER Lag/Capsular Insufficiency Negative Negative  Instability Negative Negative  Sacroiliac pain Negative  Negative   Instability      Generalized Laxity No No  Neurologic      sciatic, femoral, obturator nerves intact to light sensation  Vascular/Lymphatic      DP pulse 2+ 2+  Lumbar Exam      Patient has symmetric lumbar range of motion with negative pain referral to hip    positive Trendelenburg gait   Imaging:   Xray (3 views left hip): No significant degenerative findings about the femoral acetabular joint   MRI (left hip): There is a full-thickness tear of the gluteus medius tendon without significant atrophy on T1 view   I personally reviewed and interpreted the radiographs.     Assessment:   82 y.o. 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  At this time I did describe that overall she has had a significant trial of physical therapy as well as an injection.  Given the fact that she is suffering from decreased mobility and what I believe would be an increased fall risk I do believe she would ultimately benefit from a left hip gluteus medius repair with collagen patch augmentation.  We did discuss the risks and limitations as well as the associated recovery.  After discussion of all of her options she is ultimately elected for left hip gluteus medius repair   Plan :     -Plan for left hip gluteus medius repair of collagen patch augmentation     After a lengthy discussion of treatment options, including risks, benefits, alternatives, complications of surgical and nonsurgical conservative options, the patient elected surgical repair.    The patient  is aware of the material risks  and complications including, but not limited to injury to adjacent structures, neurovascular injury, infection, numbness, bleeding, implant failure, thermal burns, stiffness, persistent pain, failure to heal,  disease transmission from allograft, need for further surgery, dislocation, anesthetic risks, blood clots, risks of death,and others. The probabilities of surgical success and failure discussed with patient given their particular co-morbidities.The time and nature of expected rehabilitation and recovery was discussed.The patient's questions were all answered preoperatively.  No barriers to understanding were noted. I explained the natural history of the disease process and Rx rationale.  I explained to the patient what I considered to be reasonable expectations given their personal situation.  The final treatment plan was arrived at through a shared patient decision making process model.             I personally saw and evaluated the patient, and participated in the management and treatment plan.   Huel Cote, MD Attending Physician, Orthopedic Surgery

## 2023-03-22 NOTE — Interval H&P Note (Signed)
History and Physical Interval Note:  03/22/2023 11:55 AM  Ellen [name]  has presented today for surgery, with the diagnosis of LEFT GLUTEUS MEDIUS TEAR.  The various methods of treatment have been discussed with the patient and family. After consideration of risks, benefits and other options for treatment, the patient has consented to  Procedure(s): LEFT GLUTEUS MEDIUS REPAIR WITH POSSIBLE COLLAGEN PATCH AUGMENTATION (Left) as a surgical intervention.  The patient's history has been reviewed, patient examined, no change in status, stable for surgery.  I have reviewed the patient's chart and labs.  Questions were answered to the patient's satisfaction.     Huel Cote

## 2023-03-22 NOTE — Anesthesia Postprocedure Evaluation (Signed)
Anesthesia Post Note  Patient: Ellen [name]  Procedure(s) Performed: LEFT GLUTEUS MEDIUS REPAIR WITH POSSIBLE COLLAGEN PATCH AUGMENTATION (Left: Buttocks)     Patient location during evaluation: PACU Anesthesia Type: General Level of consciousness: awake Pain management: pain level controlled Vital Signs Assessment: post-procedure vital signs reviewed and stable Respiratory status: spontaneous breathing, nonlabored ventilation and respiratory function stable Cardiovascular status: blood pressure returned to baseline and stable Postop Assessment: no apparent nausea or vomiting Anesthetic complications: no   No notable events documented.  Last Vitals:  Vitals:   03/22/23 1415 03/22/23 1454  BP: (!) 145/76 (!) 160/76  Pulse: 67 77  Resp: 16 18  Temp:  (!) 36.2 C  SpO2: 97% 97%    Last Pain:  Vitals:   03/22/23 1454  TempSrc: Tympanic  PainSc: 2                  Gloris Shiroma P Sun Kihn

## 2023-03-22 NOTE — Anesthesia Preprocedure Evaluation (Addendum)
Anesthesia Evaluation  Patient identified by MRN, date of birth, ID band Patient awake    Reviewed: Allergy & Precautions, NPO status , Patient's Chart, lab work & pertinent test results  Airway Mallampati: II  TM Distance: >3 FB Neck ROM: Full    Dental no notable dental hx.    Pulmonary former smoker   Pulmonary exam normal        Cardiovascular hypertension, Pt. on medications Normal cardiovascular exam+ Valvular Problems/Murmurs (Mild AS) AS      Neuro/Psych   Anxiety      Neuromuscular disease    GI/Hepatic Neg liver ROS,GERD  Medicated and Controlled,,  Endo/Other  negative endocrine ROS    Renal/GU Renal disease     Musculoskeletal  (+) Arthritis ,    Abdominal   Peds  Hematology negative hematology ROS (+)   Anesthesia Other Findings LEFT GLUTEUS MEDIUS TEAR  Reproductive/Obstetrics                             Anesthesia Physical Anesthesia Plan  ASA: 2  Anesthesia Plan: General   Post-op Pain Management:    Induction: Intravenous  PONV Risk Score and Plan: 3 and Ondansetron, Dexamethasone and Treatment may vary due to age or medical condition  Airway Management Planned: Oral ETT  Additional Equipment:   Intra-op Plan:   Post-operative Plan: Extubation in OR  Informed Consent: I have reviewed the patients History and Physical, chart, labs and discussed the procedure including the risks, benefits and alternatives for the proposed anesthesia with the patient or authorized representative who has indicated his/her understanding and acceptance.     Dental advisory given  Plan Discussed with: CRNA  Anesthesia Plan Comments:         Anesthesia Quick Evaluation

## 2023-03-22 NOTE — Anesthesia Procedure Notes (Signed)
Procedure Name: Intubation Date/Time: 03/22/2023 12:11 PM  Performed by: Burna Cash, CRNAPre-anesthesia Checklist: Patient identified, Emergency Drugs available, Suction available and Patient being monitored Patient Re-evaluated:Patient Re-evaluated prior to induction Oxygen Delivery Method: Circle system utilized Preoxygenation: Pre-oxygenation with 100% oxygen Induction Type: IV induction Ventilation: Mask ventilation without difficulty Laryngoscope Size: Mac and 3 Tube type: Oral Tube size: 7.0 mm Number of attempts: 1 Airway Equipment and Method: Stylet and Oral airway Placement Confirmation: ETT inserted through vocal cords under direct vision, positive ETCO2 and breath sounds checked- equal and bilateral Secured at: 19 cm Tube secured with: Tape Dental Injury: Teeth and Oropharynx as per pre-operative assessment

## 2023-03-22 NOTE — Discharge Instructions (Addendum)
Discharge Instructions    Attending Surgeon: Huel Cote, MD Office Phone Number: [phone]   Diagnosis and Procedures:    Surgeries Performed: Left hip gluteus medius repair  Discharge Plan:    Diet: Resume usual diet. Begin with light or bland foods.  Drink plenty of fluids.  Activity:  Left hip 50% weight bearing for 2 weeks. You are advised to go home directly from the hospital or surgical center. Restrict your activities.  GENERAL INSTRUCTIONS: 1.  Please apply ice to your wound to help with swelling and inflammation. This will improve your comfort and your overall recovery following surgery.     2. Please call Dr. Serena Croissant office at [phone] with questions Monday-Friday during business hours. If no one answers, please leave a message and someone should get back to the patient within 24 hours. For emergencies please call 911 or proceed to the emergency room.   3. Patient to notify surgical team if experiences any of the following: Bowel/Bladder dysfunction, uncontrolled pain, nerve/muscle weakness, incision with increased drainage or redness, nausea/vomiting and Fever greater than 101.0 F.  Be alert for signs of infection including redness, streaking, odor, fever or chills. Be alert for excessive pain or bleeding and notify your surgeon immediately.  WOUND INSTRUCTIONS:   Leave your dressing, cast, or splint in place until your post operative visit.  Keep it clean and dry.  Always keep the incision clean and dry until the staples/sutures are removed. If there is no drainage from the incision you should keep it open to air. If there is drainage from the incision you must keep it covered at all times until the drainage stops  Do not soak in a bath tub, hot tub, pool, lake or other body of water until 21 days after your surgery and your incision is completely dry and healed.  If you have removable sutures (or staples) they must be removed 10-14 days (unless  otherwise instructed) from the day of your surgery.     1)  Elevate the extremity as much as possible.  2)  Keep the dressing clean and dry.  3)  Please call us if the dressing becomes wet or dirty.  4)  If you are experiencing worsening pain or worsening swelling, please call.     MEDICATIONS: Resume all previous home medications at the previous prescribed dose and frequency unless otherwise noted Start taking the  pain medications on an as-needed basis as prescribed  Please taper down pain medication over the next week following surgery.  Ideally you should not require a refill of any narcotic pain medication.  Take pain medication with food to minimize nausea. In addition to the prescribed pain medication, you may take over-the-counter pain relievers such as Tylenol.  Do NOT take additional tylenol if your pain medication already has tylenol in it.  Aspirin 325mg  daily per instructions on bottle. Narcotic policy: Per [facility] clinic policy, our goal is ensure optimal postoperative pain control with a multimodal pain management strategy. For all [facility] patients, our goal is to wean post-operative narcotic medications by 6 weeks post-operatively, and many times sooner. If this is not possible due to utilization of pain medication prior to surgery, your [facility] doctor will support your acute post-operative pain control for the first 6 weeks postoperatively, with a plan to transition you back to your primary pain team following that. Cyndia Skeeters will work to ensure a Therapist, occupational.       FOLLOWUP INSTRUCTIONS: 1. Follow up at  the Physical Therapy Clinic 3-4 days following surgery. This appointment should be scheduled unless other arrangements have been made.The Physical Therapy scheduling number is [phone] if an appointment has not already been arranged.  2. Contact Dr. Serena Croissant office during office hours at [phone] or the practice after hours line at [phone] for  non-emergencies. For medical emergencies call 911.   Discharge Location: Home   NO Tylenol until after 4:15 today if needed.   Post Anesthesia Home Care Instructions  Activity: Get plenty of rest for the remainder of the day. A responsible individual must stay with you for 24 hours following the procedure.  For the next 24 hours, DO NOT: -Drive a car -Advertising copywriter -Drink alcoholic beverages -Take any medication unless instructed by your physician -Make any legal decisions or sign important papers.  Meals: Start with liquid foods such as gelatin or soup. Progress to regular foods as tolerated. Avoid greasy, spicy, heavy foods. If nausea and/or vomiting occur, drink only clear liquids until the nausea and/or vomiting subsides. Call your physician if vomiting continues.  Special Instructions/Symptoms: Your throat may feel dry or sore from the anesthesia or the breathing tube placed in your throat during surgery. If this causes discomfort, gargle with warm salt water. The discomfort should disappear within 24 hours.  If you had a scopolamine patch placed behind your ear for the management of post- operative nausea and/or vomiting:  1. The medication in the patch is effective for 72 hours, after which it should be removed.  Wrap patch in a tissue and discard in the trash. Wash hands thoroughly with soap and water. 2. You may remove the patch earlier than 72 hours if you experience unpleasant side effects which may include dry mouth, dizziness or visual disturbances. 3. Avoid touching the patch. Wash your hands with soap and water after contact with the patch.

## 2023-03-22 NOTE — Op Note (Signed)
   Date of Surgery: 03/22/2023  INDICATIONS: Ms. Bohnsack is a 82 y.o.-year-old female with left hip gluteus medius tear.  The risk and benefits of the procedure were discussed in detail and documented in the pre-operative evaluation.   PREOPERATIVE DIAGNOSIS: 1. Left hip gluteus medius tear   POSTOPERATIVE DIAGNOSIS: Same.  PROCEDURE: 1. Left hip gluteus medius repair 2. Left hip trochanteric bursectomy  SURGEON: Benancio Deeds MD  ASSISTANT: Kerby Less, ATC  ANESTHESIA:  general  IV FLUIDS AND URINE: See anesthesia record.  ANTIBIOTICS: Ancef  ESTIMATED BLOOD LOSS: 10 mL.  IMPLANTS:  Implant Name Type Inv. Item Serial No. Manufacturer Lot No. LRB No. Used Action  ANCHOR JUGGERKNOT SOFT 1.5 - [id number] Anchor ANCHOR JUGGERKNOT SOFT 1.5  ZIMMER [employer]ON(ORTH,TRAU,BIO,SG) [id number] Left 2 Implanted  [facility] SUT KNTLS STRL SHLDR SYS - [id number] Anchor ANCH SUT KNTLS STRL SHLDR SYS  ZIMMER RECON(ORTH,TRAU,BIO,SG) [id number] Left 2 Implanted  IMPL TAPESTRY BIOINTEGR 40X30 - [id number] Mesh General IMPL TAPESTRY BIOINTEGR 40X30  ZIMMER RECON(ORTH,TRAU,BIO,SG) [id number] Left 1 Implanted    DRAINS: None  CULTURES: None  COMPLICATIONS: none  DESCRIPTION OF PROCEDURE:   The patient was identified in the preoperative holding area.  The correct site was marked and confirmed according to nursing.  The patient was subsequently taken back to the operating room.  Antibiotics were given 1 hour prior to incision.  Anesthesia was induced.  He was placed in the lateral position with a beanbag positioner with care to pad the down leg and peroneal nerve.   Patient was prepped and draped in the usual sterile fashion.  Again timeout was performed confirming the correct side.  An approach was made over the lateral aspect of the greater trochanter.  Dissection was initially carried down sharply with 15 blade and electrocautery was used to perform hemostasis.  A 15 blade was used to make a nick in the IT band  which was completed with Mayo scissors.  At this point we encountered the gluteus medius tendon. There was overlying bursa was dissected out sharply with Metzenbaum scissors and removed. This was extremely thin and then completely torn.  This was debrided and the trochanteric lateral posterior facet was prepared using a Cobb.  We then mobilized the gluteus medius abductor muscles with an Allis clamp.  Excess bursal tissue was excised sharply with Mayo scissors.  A double row type configuration was then utilized with 2 Medial Row all suture anchors (a total of 8 limbs).  These were placed through the tissue and then brought to 2 Lateral Row Quattro anchors.  This produced an anatomic footprint restoration of the gluteus tendon. Given the quality of the issue, the decision was made to augment the tissue with a collagen tapestry patch and suturing into place with 0-vicryl.   The wounds were thoroughly irrigated.  We closed in layers of 0 Vicryl for the IT band, 2-0 Vicryl, and staples for skin. The patient was awoken and taken to the PACU.  All counts were correct at the end of the case and no complications.    POSTOPERATIVE PLAN: She will be 50% weight bearing left hip for 2 weeks. She will be placed on aspirin for blood clot prevention. She will be seen in 2 weeks for suture removal.  Benancio Deeds, MD 12:59 PM

## 2023-03-23 ENCOUNTER — Encounter (HOSPITAL_BASED_OUTPATIENT_CLINIC_OR_DEPARTMENT_OTHER): Payer: Self-pay | Admitting: Orthopaedic Surgery

## 2023-03-24 ENCOUNTER — Other Ambulatory Visit (HOSPITAL_BASED_OUTPATIENT_CLINIC_OR_DEPARTMENT_OTHER): Payer: Self-pay | Admitting: Orthopaedic Surgery

## 2023-03-24 DIAGNOSIS — L989 Disorder of the skin and subcutaneous tissue, unspecified: Secondary | ICD-10-CM

## 2023-03-25 ENCOUNTER — Ambulatory Visit: Payer: Medicare Other | Admitting: Physical Therapy

## 2023-03-26 ENCOUNTER — Telehealth: Payer: Self-pay | Admitting: Orthopaedic Surgery

## 2023-03-26 DIAGNOSIS — S76012D Strain of muscle, fascia and tendon of left hip, subsequent encounter: Secondary | ICD-10-CM | POA: Diagnosis not present

## 2023-03-26 DIAGNOSIS — Z85828 Personal history of other malignant neoplasm of skin: Secondary | ICD-10-CM | POA: Diagnosis not present

## 2023-03-26 DIAGNOSIS — I10 Essential (primary) hypertension: Secondary | ICD-10-CM | POA: Diagnosis not present

## 2023-03-26 DIAGNOSIS — Z604 Social exclusion and rejection: Secondary | ICD-10-CM | POA: Diagnosis not present

## 2023-03-26 DIAGNOSIS — F419 Anxiety disorder, unspecified: Secondary | ICD-10-CM | POA: Diagnosis not present

## 2023-03-26 DIAGNOSIS — Z87891 Personal history of nicotine dependence: Secondary | ICD-10-CM | POA: Diagnosis not present

## 2023-03-26 DIAGNOSIS — Z4789 Encounter for other orthopedic aftercare: Secondary | ICD-10-CM | POA: Diagnosis not present

## 2023-03-26 DIAGNOSIS — M1990 Unspecified osteoarthritis, unspecified site: Secondary | ICD-10-CM | POA: Diagnosis not present

## 2023-03-26 DIAGNOSIS — Z7982 Long term (current) use of aspirin: Secondary | ICD-10-CM | POA: Diagnosis not present

## 2023-03-26 DIAGNOSIS — M7062 Trochanteric bursitis, left hip: Secondary | ICD-10-CM | POA: Diagnosis not present

## 2023-03-26 DIAGNOSIS — K219 Gastro-esophageal reflux disease without esophagitis: Secondary | ICD-10-CM | POA: Diagnosis not present

## 2023-03-26 NOTE — Telephone Encounter (Signed)
Ellen [name] form [facility] calling for verbal order for PT 2w2, please give update for PT if it is going to be at the facility also, pt is non compliant with Weight baring status CB [phone] Secure VM

## 2023-03-26 NOTE — Telephone Encounter (Signed)
Holding for [employer]

## 2023-03-30 DIAGNOSIS — M1990 Unspecified osteoarthritis, unspecified site: Secondary | ICD-10-CM | POA: Diagnosis not present

## 2023-03-30 DIAGNOSIS — K219 Gastro-esophageal reflux disease without esophagitis: Secondary | ICD-10-CM | POA: Diagnosis not present

## 2023-03-30 DIAGNOSIS — Z4789 Encounter for other orthopedic aftercare: Secondary | ICD-10-CM | POA: Diagnosis not present

## 2023-03-30 DIAGNOSIS — M7062 Trochanteric bursitis, left hip: Secondary | ICD-10-CM | POA: Diagnosis not present

## 2023-03-30 DIAGNOSIS — S76012D Strain of muscle, fascia and tendon of left hip, subsequent encounter: Secondary | ICD-10-CM | POA: Diagnosis not present

## 2023-03-30 DIAGNOSIS — F419 Anxiety disorder, unspecified: Secondary | ICD-10-CM | POA: Diagnosis not present

## 2023-03-30 NOTE — Telephone Encounter (Signed)
Spoke to [place] informing of verbal orders

## 2023-04-01 ENCOUNTER — Ambulatory Visit: Payer: Medicare Other | Admitting: Orthopedic Surgery

## 2023-04-01 DIAGNOSIS — M1990 Unspecified osteoarthritis, unspecified site: Secondary | ICD-10-CM | POA: Diagnosis not present

## 2023-04-01 DIAGNOSIS — Z4789 Encounter for other orthopedic aftercare: Secondary | ICD-10-CM | POA: Diagnosis not present

## 2023-04-01 DIAGNOSIS — K219 Gastro-esophageal reflux disease without esophagitis: Secondary | ICD-10-CM | POA: Diagnosis not present

## 2023-04-01 DIAGNOSIS — M7062 Trochanteric bursitis, left hip: Secondary | ICD-10-CM | POA: Diagnosis not present

## 2023-04-01 DIAGNOSIS — S76012D Strain of muscle, fascia and tendon of left hip, subsequent encounter: Secondary | ICD-10-CM | POA: Diagnosis not present

## 2023-04-01 DIAGNOSIS — F419 Anxiety disorder, unspecified: Secondary | ICD-10-CM | POA: Diagnosis not present

## 2023-04-02 ENCOUNTER — Ambulatory Visit (INDEPENDENT_AMBULATORY_CARE_PROVIDER_SITE_OTHER): Payer: Medicare Other | Admitting: Orthopedic Surgery

## 2023-04-02 DIAGNOSIS — L97919 Non-pressure chronic ulcer of unspecified part of right lower leg with unspecified severity: Secondary | ICD-10-CM

## 2023-04-02 DIAGNOSIS — L97929 Non-pressure chronic ulcer of unspecified part of left lower leg with unspecified severity: Secondary | ICD-10-CM | POA: Diagnosis not present

## 2023-04-02 DIAGNOSIS — I83019 Varicose veins of right lower extremity with ulcer of unspecified site: Secondary | ICD-10-CM | POA: Diagnosis not present

## 2023-04-02 DIAGNOSIS — L97421 Non-pressure chronic ulcer of left heel and midfoot limited to breakdown of skin: Secondary | ICD-10-CM

## 2023-04-02 DIAGNOSIS — I83029 Varicose veins of left lower extremity with ulcer of unspecified site: Secondary | ICD-10-CM | POA: Diagnosis not present

## 2023-04-05 ENCOUNTER — Ambulatory Visit (INDEPENDENT_AMBULATORY_CARE_PROVIDER_SITE_OTHER): Payer: Medicare Other | Admitting: Orthopaedic Surgery

## 2023-04-05 DIAGNOSIS — M67959 Unspecified disorder of synovium and tendon, unspecified thigh: Secondary | ICD-10-CM

## 2023-04-05 NOTE — Progress Notes (Signed)
Post Operative Evaluation    Procedure/Date of Surgery: Left hip gluteus medius repair 11/4  Interval History:    Presents today 2 weeks status post the above procedure.  She has been progressing her weightbearing on the side.  Overall she feels very good.  She has begun physical therapy and is progressing quite nicely with strength and activity.   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   Left Buccal Cheek (Keratoacanthoma)   Umbilical hernia    Past Surgical History:  Procedure Laterality Date   BOWEL RESECTION  07/09/2013   Procedure: SMALL BOWEL RESECTION;  Surgeon: Velora Heckler, MD;  Location: [facility];  Service: General;;   Brock Bad REPAIR Left 03/22/2023   Procedure: LEFT GLUTEUS MEDIUS REPAIR WITH POSSIBLE COLLAGEN PATCH AUGMENTATION;  Surgeon: Huel Cote, MD;  Location: [facility];  Service: Orthopedics;  Laterality: Left;   LAPAROTOMY N/A 07/09/2013   Procedure: EXPLORATORY LAPAROTOMY;  Surgeon: Velora Heckler, MD;  Location: [facility];  Service: General;  Laterality: N/A;   SHOULDER ARTHROSCOPY WITH CAPSULORRHAPHY     Social History   Socioeconomic History   Marital status: Married    Spouse name: Not on file   Number of children: 0   Years of education: Not on file   Highest education level: Not on file  Occupational History   Occupation: Retired   Tobacco Use   Smoking status: Former    Types: Cigarettes   Smokeless tobacco: Never  Vaping Use   Vaping status: Never Used  Substance and Sexual Activity   Alcohol use: Yes    Alcohol/week: 7.0 standard drinks of alcohol    Types: 7 Glasses of wine per week    Comment: nightly glass of wine   Drug use: No    Sexual activity: Not Currently  Other Topics Concern   Not on file  Social History Narrative   Enjoys gardening    Social Determinants of Health   Financial Resource Strain: Low Risk  (07/16/2022)   Overall Financial Resource Strain (CARDIA)    Difficulty of Paying Living Expenses: Not hard at all  Food Insecurity: No Food Insecurity (12/30/2022)   Hunger Vital Sign    Worried About Running Out of Food in the Last Year: Never true    Ran Out of Food in the Last Year: Never true  Transportation Needs: No Transportation Needs (12/30/2022)   PRAPARE - Administrator, Civil Service (Medical): No    Lack of Transportation (Non-Medical): No  Physical Activity: Sufficiently Active (07/16/2022)   Exercise Vital Sign    Days of Exercise per Week: 5 days    Minutes of Exercise per Session: 120 min  Stress: No Stress Concern Present (07/16/2022)   [employer] of Occupational Health - Occupational Stress Questionnaire    Feeling of Stress : Not at all  Social Connections: Moderately Integrated (07/16/2022)   Social Connection and Isolation Panel [NHANES]    Frequency of Communication with Friends and Family: More than three times  a week    Frequency of Social Gatherings with Friends and Family: More than three times a week    Attends Religious Services: More than 4 times per year    Active Member of [employer] or Organizations: No    Attends Engineer, structural: Never    Marital Status: Married   Family History  Problem Relation Age of Onset   Diabetes Mother    Heart attack Mother    CAD Father    Heart attack Father    Breast cancer Neg Hx    Colon cancer Neg Hx    Esophageal cancer Neg Hx    Rectal cancer Neg Hx    Stomach cancer Neg Hx    Allergies  Allergen Reactions   Lactose Intolerance (Gi) Diarrhea   Sulfa Antibiotics Nausea And Vomiting and Swelling   Current Outpatient Medications  Medication Sig Dispense Refill   aspirin EC 325 MG tablet Take 1  tablet (325 mg total) by mouth daily. 14 tablet 0   Calcium Carbonate Antacid (TUMS PO) Take 1 tablet by mouth as needed (reflux).     Carboxymethylcellulose Sodium (EYE DROPS OP) Place 2 drops into both eyes daily. Thera Tears     furosemide (LASIX) 40 MG tablet Take 1 tablet (40 mg total) by mouth daily. 30 tablet 5   hydrochlorothiazide (MICROZIDE) 12.5 MG capsule TAKE 1 CAPSULE(12.5 MG) BY MOUTH DAILY 90 capsule 1   losartan (COZAAR) 50 MG tablet TAKE 1 TABLET(50 MG) BY MOUTH DAILY 90 tablet 1   mupirocin ointment (BACTROBAN) 2 % Apply 1 Application topically 2 (two) times daily. 30 g 3   oxyCODONE (ROXICODONE) 5 MG immediate release tablet Take 1 tablet (5 mg total) by mouth every 4 (four) hours as needed for severe pain (pain score 7-10) or breakthrough pain. 30 tablet 0   pantoprazole (PROTONIX) 40 MG tablet TAKE 1 TABLET(40 MG) BY MOUTH DAILY (Patient taking differently: Take 40 mg by mouth daily as needed (reflux).) 90 tablet 0   PREMPRO 0.45-1.5 MG tablet TAKE 1 TABLET BY MOUTH DAILY (Patient taking differently: Take 1 tablet by mouth 2 (two) times a week.) 28 tablet 0   No current facility-administered medications for this visit.   No results found.  Review of Systems:   A ROS was performed including pertinent positives and negatives as documented in the HPI.   Musculoskeletal Exam:    There were no vitals taken for this visit.  Left hip 30 degrees internal/external rotation without pain.  Incision is well-appearing without erythema or drainage.  Distal neurosensory exam is intact  Imaging:      I personally reviewed and interpreted the radiographs.   Assessment:   2 weeks status post left hip gluteus medius repair.  At this time she will advance her weightbearing to as tolerated.  I will plan to see her back in 4 weeks for reassessment  Plan :    -Return to clinic 4 weeks for reassessment      I personally saw and evaluated the patient, and participated in the  management and treatment plan.  Huel Cote, MD Attending Physician, Orthopedic Surgery  This document was dictated using Dragon voice recognition software. A reasonable attempt at proof reading has been made to minimize errors.

## 2023-04-06 DIAGNOSIS — M1990 Unspecified osteoarthritis, unspecified site: Secondary | ICD-10-CM | POA: Diagnosis not present

## 2023-04-06 DIAGNOSIS — Z4789 Encounter for other orthopedic aftercare: Secondary | ICD-10-CM | POA: Diagnosis not present

## 2023-04-06 DIAGNOSIS — S76012D Strain of muscle, fascia and tendon of left hip, subsequent encounter: Secondary | ICD-10-CM | POA: Diagnosis not present

## 2023-04-06 DIAGNOSIS — K219 Gastro-esophageal reflux disease without esophagitis: Secondary | ICD-10-CM | POA: Diagnosis not present

## 2023-04-06 DIAGNOSIS — M7062 Trochanteric bursitis, left hip: Secondary | ICD-10-CM | POA: Diagnosis not present

## 2023-04-06 DIAGNOSIS — F419 Anxiety disorder, unspecified: Secondary | ICD-10-CM | POA: Diagnosis not present

## 2023-04-08 ENCOUNTER — Encounter: Payer: Self-pay | Admitting: Orthopedic Surgery

## 2023-04-08 ENCOUNTER — Ambulatory Visit (INDEPENDENT_AMBULATORY_CARE_PROVIDER_SITE_OTHER): Payer: Medicare Other | Admitting: Orthopedic Surgery

## 2023-04-08 DIAGNOSIS — L97929 Non-pressure chronic ulcer of unspecified part of left lower leg with unspecified severity: Secondary | ICD-10-CM | POA: Diagnosis not present

## 2023-04-08 DIAGNOSIS — I83019 Varicose veins of right lower extremity with ulcer of unspecified site: Secondary | ICD-10-CM | POA: Diagnosis not present

## 2023-04-08 DIAGNOSIS — I83029 Varicose veins of left lower extremity with ulcer of unspecified site: Secondary | ICD-10-CM

## 2023-04-08 DIAGNOSIS — L97919 Non-pressure chronic ulcer of unspecified part of right lower leg with unspecified severity: Secondary | ICD-10-CM

## 2023-04-08 NOTE — Progress Notes (Signed)
Office Visit Note   Patient: Ellen [name]           Date of Birth: 27-Oct-1940           MRN: [id number] Visit Date: 04/02/2023              Requested by: Ardith Dark, MD [address] [place],  [place] [address] PCP: Ardith Dark, MD  Chief Complaint  Patient presents with   Right Leg - Wound Check   Left Leg - Wound Check      HPI: Patient is an 82 year old woman who is seen for initial evaluation for venous insufficiency and left heel ulcer.  Patient states she recently has had a bowel obstruction and was at [facility].  Patient is status post left gluteus medius muscle repair with Dr. Steward Drone  Assessment & Plan: Visit Diagnoses:  1. 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  Recommended protein supplements.  Follow-Up Instructions: Return in about 1 week (around 04/09/2023).   Ortho Exam  Patient is alert, oriented, no adenopathy, well-dressed, normal affect, normal respiratory effort. Examination patient has palpable dorsalis pedis pulses bilaterally.  She has significant venous stasis insufficiency swelling both lower extremities with pitting edema there is an ulcer on the left heel and healing venous stasis ulcers bilaterally.  Imaging: No results found. No images are attached to the encounter.  Labs: Lab Results  Component Value Date   HGBA1C 5.2 12/25/2022   HGBA1C 5.5 12/01/2021   HGBA1C 5.4 10/05/2017   LABURIC 5.2 02/05/2020   REPTSTATUS 07/10/2013 FINAL 07/09/2013   GRAMSTAIN  07/09/2013    ABUNDANT WBC PRESENT, PREDOMINANTLY PMN FEW GRAM NEGATIVE RODS Performed at [facility]   GRAMSTAIN  07/09/2013    ABUNDANT WBC PRESENT, PREDOMINANTLY PMN MODERATE GRAM NEGATIVE RODS Performed at [facility]   CULT NO  GROWTH Performed at [facility] 07/09/2013   LABORGA KLEBSIELLA PNEUMONIAE 07/09/2013     Lab Results  Component Value Date   ALBUMIN 2.8 (L) 12/25/2022   ALBUMIN 3.6 12/24/2022   ALBUMIN 4.0 12/01/2021    Lab Results  Component Value Date   MG 2.3 12/27/2022   MG 2.3 12/24/2022   No results found for: "VD25OH"  No results found for: "PREALBUMIN"    Latest Ref Rng & Units 12/28/2022    4:38 AM 12/27/2022    4:45 AM 12/26/2022    5:00 AM  CBC EXTENDED  WBC 4.0 - 10.5 K/uL 8.1  12.5  13.6   RBC 3.87 - 5.11 MIL/uL 4.15  4.70  4.41   Hemoglobin 12.0 - 15.0 g/dL 19.1  47.8  29.5   HCT 36.0 - 46.0 % 42.1  47.8  45.2   Platelets 150 - 400 K/uL 269  301  294      There is no height or weight on file to calculate BMI.  Orders:  No orders of the defined types were placed in this encounter.  No orders of the defined types were placed in this encounter.    Procedures: No procedures performed  Clinical Data: No additional findings.  ROS:  All other systems negative, except as noted in the HPI. Review of Systems  Objective: Vital Signs: There were no vitals taken  for this visit.  Specialty Comments:  No specialty comments available.  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   Hypokalemia 12/27/2022   Volume depletion 12/24/2022   Constipation 12/24/2022   Partial small bowel obstruction (HCC) 12/24/2022   Polycythemia 12/24/2022   NSAID long-term use 08/26/2022   Gastroesophageal reflux disease 08/26/2022   Bloating 08/26/2022   Small intestinal bacterial overgrowth (SIBO) 08/26/2022   Osteoarthritis 12/01/2021   Plantar wart 12/01/2021   Ventral hernia 01/01/2021   Diverticulosis 01/05/2019   Dermatitis 01/05/2019   Systolic murmur 07/25/2018   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   Hyperglycemia 10/05/2017   Leg edema 10/05/2017   Hammer toe 10/05/2017   Hot flashes due to menopause  10/05/2017   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/10/2013 07/11/2013   Hypertension 07/09/2013   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   Left Buccal Cheek (Keratoacanthoma)   Umbilical hernia     Family History  Problem Relation Age of Onset   Diabetes Mother    Heart attack Mother    CAD Father    Heart attack Father    Breast cancer Neg Hx    Colon cancer Neg Hx    Esophageal cancer Neg Hx    Rectal cancer Neg Hx    Stomach cancer Neg Hx     Past Surgical History:  Procedure Laterality Date   BOWEL RESECTION  07/09/2013   Procedure: SMALL BOWEL RESECTION;  Surgeon: Velora Heckler, MD;  Location: [facility];  Service: General;;   Brock Bad REPAIR Left 03/22/2023   Procedure: LEFT GLUTEUS MEDIUS REPAIR WITH POSSIBLE COLLAGEN PATCH AUGMENTATION;  Surgeon: Huel Cote, MD;  Location: [facility];  Service: Orthopedics;  Laterality: Left;   LAPAROTOMY N/A 07/09/2013   Procedure: EXPLORATORY LAPAROTOMY;  Surgeon: Velora Heckler, MD;  Location: [facility];  Service: General;  Laterality: N/A;   SHOULDER ARTHROSCOPY WITH CAPSULORRHAPHY     Social History   Occupational History   Occupation: Retired   Tobacco Use   Smoking status: Former    Types: Cigarettes   Smokeless tobacco: Never  Vaping Use   Vaping status: Never Used  Substance and Sexual Activity   Alcohol use: Yes    Alcohol/week: 7.0 standard drinks of alcohol    Types: 7 Glasses of wine per week    Comment: nightly glass of wine   Drug use: No   Sexual activity: Not Currently

## 2023-04-09 DIAGNOSIS — M7062 Trochanteric bursitis, left hip: Secondary | ICD-10-CM | POA: Diagnosis not present

## 2023-04-09 DIAGNOSIS — S76012D Strain of muscle, fascia and tendon of left hip, subsequent encounter: Secondary | ICD-10-CM | POA: Diagnosis not present

## 2023-04-09 DIAGNOSIS — Z4789 Encounter for other orthopedic aftercare: Secondary | ICD-10-CM | POA: Diagnosis not present

## 2023-04-09 DIAGNOSIS — M1990 Unspecified osteoarthritis, unspecified site: Secondary | ICD-10-CM | POA: Diagnosis not present

## 2023-04-09 DIAGNOSIS — F419 Anxiety disorder, unspecified: Secondary | ICD-10-CM | POA: Diagnosis not present

## 2023-04-09 DIAGNOSIS — K219 Gastro-esophageal reflux disease without esophagitis: Secondary | ICD-10-CM | POA: Diagnosis not present

## 2023-04-13 NOTE — Therapy (Signed)
OUTPATIENT PHYSICAL THERAPY LOWER EXTREMITY EVALUATION   Patient Name: Ellen [name] MRN: [id number] DOB:1941/02/11, 82 y.o., female Today's Date: 04/14/2023  END OF SESSION:  PT End of Session - 04/14/23 1414     Visit Number 1    Number of Visits 25    Date for PT Re-Evaluation 07/07/23    Authorization Type MCR A & B    PT Start Time 1408    PT Stop Time 1508    PT Time Calculation (min) 60 min    Activity Tolerance Patient tolerated treatment well    Behavior During Therapy WFL for tasks assessed/performed             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   Left Buccal Cheek (Keratoacanthoma)   Umbilical hernia    Past Surgical History:  Procedure Laterality Date   BOWEL RESECTION  07/09/2013   Procedure: SMALL BOWEL RESECTION;  Surgeon: Velora Heckler, MD;  Location: [facility];  Service: General;;   Brock Bad REPAIR Left 03/22/2023   Procedure: LEFT GLUTEUS MEDIUS REPAIR WITH POSSIBLE COLLAGEN PATCH AUGMENTATION;  Surgeon: Huel Cote, MD;  Location: [facility];  Service: Orthopedics;  Laterality: Left;   LAPAROTOMY N/A 07/09/2013   Procedure: EXPLORATORY LAPAROTOMY;  Surgeon: Velora Heckler, MD;  Location: [facility];  Service: General;  Laterality: N/A;   SHOULDER ARTHROSCOPY WITH CAPSULORRHAPHY     Patient Active Problem List   Diagnosis Date Noted   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   Hypokalemia 12/27/2022   Volume depletion 12/24/2022   Constipation 12/24/2022   Partial small bowel obstruction (HCC) 12/24/2022   Polycythemia 12/24/2022   NSAID long-term use 08/26/2022   Gastroesophageal reflux disease 08/26/2022   Bloating 08/26/2022   Small intestinal bacterial overgrowth (SIBO) 08/26/2022   Osteoarthritis 12/01/2021   Plantar  wart 12/01/2021   Ventral hernia 01/01/2021   Diverticulosis 01/05/2019   Dermatitis 01/05/2019   Systolic murmur 07/25/2018   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   Hyperglycemia 10/05/2017   Leg edema 10/05/2017   Hammer toe 10/05/2017   Hot flashes due to menopause 10/05/2017   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/10/2013 07/11/2013   Hypertension 07/09/2013    REFERRING PROVIDER: Huel Cote, MD  REFERRING DIAG: [phone] (ICD-10-CM) - Tendinopathy of gluteus medius  S/p glute med repair  THERAPY DIAG:  Pain in left hip  Muscle weakness (generalized)  Stiffness of left hip, not elsewhere classified  Difficulty in walking, not elsewhere classified  Rationale for Evaluation and Treatment: Rehabilitation  ONSET DATE: DOS 03/22/2023  SUBJECTIVE:   SUBJECTIVE STATEMENT: L hip pain began 1 year ago with no specific MOI.  Pt had PT last year and reports no improvement.  She states PT irritated her hip.      Pt had an intestinal obstruction in August.  She went to hospital and got an injection.  She was hospitalized and developed an ulcer in bilat heels.  Pt states her R heel has healed, and L heel is healing.  Pt is seeing MD for ulcers.  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  Pt underwent Left hip gluteus medius repair and Left hip trochanteric  bursectomy on 03/22/2023.  Post op plan indicated pt to be 50% Wb'ing x 2 weeks.  Pt received 4-5 visits of HHPT.  Pt is now WBAT per MD note.    Pt is limited with ambulation distance and unable to perform her walking program.  Pt has to use AD with ambulation.  Pt is limited with her gardening and bending.  Pt has difficulty with stairs.  Pt has difficulty with taking care of her husband.  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 Pt mowed her lawn yesterday.  PT instructed pt to not mow her lawn.    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    PAIN:  NPRS:  0/10 current, 8/10 worst.  Worst pain is at the end of the day.  Location: L hip  PRECAUTIONS: Other: per surgical protocol, Ulcer on L heel/post ankle/achilles, lumbar anterolisthesis, double hernia    WEIGHT BEARING RESTRICTIONS: Yes WBAT  FALLS:  Has patient fallen in last 6 months? No  LIVING ENVIRONMENT: Lives with: lives with their spouse Lives in: home; lives on 1 floor though does have to go to basement. 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  Pt was ambulating without AD.    PATIENT GOALS: to walk straight and walk normally, improve strength  NEXT MD VISIT: 05/07/2023  OBJECTIVE:  Note: Objective measures were completed at Evaluation unless otherwise noted.  DIAGNOSTIC FINDINGS: Pt is post op.  PATIENT SURVEYS:  FOTO 55 with a goal of 73 at visit 15  COGNITION: Overall cognitive status: Within functional limits for tasks assessed    OBSERVATION:  Incision on lateral L hip is intact with no signs of infection.  Incision is clean and dry.  Healing ulcer on L posterior heel/ankle/achilles.  Pt removed her band-aid.  PT brought band-aids in to cover ulcer though pt declined band-aids.  She states she doesn't need a band aid and it will be fine returning home.    GAIT: Comments: Pt ambulating with SPC.  She denies pain with ambulation.  Slow gait speed.  Decreased toe off on L and  increased stance time on R.    TODAY'S TREATMENT:                                                                                                                                Pt performed quad sets with 5 sec hold approx 10-12  reps and posterior pelvic tilts 2x10.  Pt received a HEP handout and was educated in correct form and appropriate frequency.  PT instructed pt she should not have pain with HEP.  PT educated pt in protocol and post op restrictions including not performing hip abduction and not moving past 90 deg in hip flexion.  Pt had stated that she mowed her lawn and PT instructed her that she should not mow her lawn and should not perform yard work at this time.    PATIENT EDUCATION:  Education details: dx, relevant anatomy, HEP, POC, rationale of interventions, WBAT restrictions, and post op and protocol restrictions/limitations.   Person educated: Patient Education method: Explanation, Demonstration, Tactile cues, Verbal cues, and Handouts Education comprehension: verbalized understanding, returned demonstration, verbal cues required, tactile cues required, and needs further education  HOME EXERCISE PROGRAM: Access Code: [email or web address] URL: [email or web address] Date: 04/14/2023 Prepared by: Aaron Edelman  Exercises - Supine Quadricep Sets  - 2 x daily - 7 x weekly - 2 sets - 10 reps - 5 seconds hold - Supine Posterior Pelvic Tilt  - 2 x daily - 7 x weekly - 2 sets - 10 reps  ASSESSMENT:  CLINICAL IMPRESSION: Patient is an 82 y.o. female 3 weeks and 1 day s/p Left hip gluteus medius repair and trochanteric bursectomy.  Pt had ulcers in distal bilat LE's.  The R LE ulcer is healed and the L ulcer is healing.  Pt has completed HHPT.  She is ambulating with a SPC.  She is limited with ambulation distance and has difficulty with stairs.  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  Pt is a caregiver for her husband and has difficulty with  taking care of her husband.  Pt has gait deficits and L LE muscle weakness.  Pt should benefit from skilled PT services per protocol to address impairments and to improve overall function.       OBJECTIVE IMPAIRMENTS: Abnormal gait, decreased activity tolerance, decreased endurance, decreased mobility, difficulty walking, decreased ROM, decreased strength, hypomobility, and pain.   ACTIVITY LIMITATIONS: carrying, lifting, standing, squatting, stairs, transfers, and locomotion level  PARTICIPATION LIMITATIONS: cleaning and yard work  PERSONAL FACTORS: 3+ comorbidities: L LE ulcer, double hernia, arthritis  are also affecting patient's functional outcome.   REHAB POTENTIAL: Good  CLINICAL DECISION MAKING: Stable/uncomplicated  EVALUATION COMPLEXITY: Low   GOALS:   SHORT TERM GOALS:  Pt will be independent and compliant with HEP for improved pain, strength, and function. Baseline: Goal status: INITIAL Target date:  05/05/2023   2.  Pt will demo improved quality of gait with increased stance time and toe off on L.   Baseline:  Goal status: INITIAL Target date:  05/12/2023  3.  Pt will progress with exercises per protocol without adverse effects for improved strength and mobility.   Baseline:  Goal status: INITIAL Target date:  05/26/2023   4.  Pt will report improved tolerance with standing activities and ambulation.  Baseline:  Goal status: INITIAL Target date:  06/16/2023   5.  Pt will ambulate with a normalized heel to toe gait without limping.  Baseline:  Goal status: INITIAL Target date:  06/23/2023   6.  Pt will be able to perform a 6 inch step up with good form and control with L LE leading for improved performance of stairs abd and improved functional strength. Baseline:  Goal status: INITIAL Target date:  06/30/2023   LONG TERM GOALS: Target date:  07/21/2023   Pt will be able to ambulate extended community distance without significant difficulty and pain.    Baseline:  Goal status: INITIAL  2.  Pt will be able to perform her ADLs and IADLs including household chores without significant difficulty and pain. Baseline:  Goal status: INITIAL  3.  Pt will be able to take care of her husband without significant limitations.   Baseline:  Goal status: INITIAL  4.  Pt will be able to perform mini squats with good form and without increased pain in order for improved functional LE strength and to assist with returning to gardening activities.   Baseline:  Goal status: INITIAL     PLAN:  PT FREQUENCY: 1x/week x 2-3 weeks and 2x/wk afterwards  PT DURATION: 12-14 weeks  PLANNED INTERVENTIONS: 97164- PT Re-evaluation, 97110-Therapeutic exercises, 97530- Therapeutic activity, [id number]- Neuromuscular re-education, 97535- Self Care, 16109- Manual therapy, [id number]- Gait training, [phone]- Aquatic Therapy, 97014- Electrical stimulation (unattended), [phone]- Ultrasound, Patient/Family education, Balance training, Stair training, Taping, Dry Needling, Joint mobilization, Scar mobilization, Cryotherapy, and Moist heat  PLAN FOR NEXT SESSION:  Cont per Dr. Serena Croissant gluteus medius repair.  Review and perform HEP.  Assess hip PROM per protocol ranges.  Be aware of healing ulcer in distal L LE at post heel/ankle.    Audie Clear III PT, DPT 04/14/23 11:53 PM

## 2023-04-14 ENCOUNTER — Ambulatory Visit (HOSPITAL_BASED_OUTPATIENT_CLINIC_OR_DEPARTMENT_OTHER): Payer: Medicare Other | Attending: Orthopaedic Surgery | Admitting: Physical Therapy

## 2023-04-14 ENCOUNTER — Encounter (HOSPITAL_BASED_OUTPATIENT_CLINIC_OR_DEPARTMENT_OTHER): Payer: Self-pay | Admitting: Physical Therapy

## 2023-04-14 ENCOUNTER — Other Ambulatory Visit: Payer: Self-pay

## 2023-04-14 DIAGNOSIS — R262 Difficulty in walking, not elsewhere classified: Secondary | ICD-10-CM | POA: Insufficient documentation

## 2023-04-14 DIAGNOSIS — M6281 Muscle weakness (generalized): Secondary | ICD-10-CM | POA: Insufficient documentation

## 2023-04-14 DIAGNOSIS — M25552 Pain in left hip: Secondary | ICD-10-CM | POA: Insufficient documentation

## 2023-04-14 DIAGNOSIS — M25652 Stiffness of left hip, not elsewhere classified: Secondary | ICD-10-CM | POA: Diagnosis not present

## 2023-04-14 DIAGNOSIS — M67959 Unspecified disorder of synovium and tendon, unspecified thigh: Secondary | ICD-10-CM | POA: Diagnosis present

## 2023-04-14 DIAGNOSIS — M67952 Unspecified disorder of synovium and tendon, left thigh: Secondary | ICD-10-CM | POA: Insufficient documentation

## 2023-04-19 ENCOUNTER — Encounter: Payer: Self-pay | Admitting: Orthopedic Surgery

## 2023-04-19 NOTE — Progress Notes (Signed)
Office Visit Note   Patient: Ellen [name]           Date of Birth: [birth year]           MRN: [id number] Visit Date: 04/08/2023              Requested by: Ardith Dark, MD [address] [place],  [place] [address] PCP: Ardith Dark, MD  Chief Complaint  Patient presents with   Right Leg - Follow-up      HPI: Patient is a 82 year old woman who presents in follow-up for bilateral lower extremity venous ulcers.  Assessment & Plan: Visit Diagnoses:  1. 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  Recommended compression stockings.  Follow-Up Instructions: No follow-ups on file.   Ortho Exam  Patient is alert, oriented, no adenopathy, well-dressed, normal affect, normal respiratory effort. Examination the calf measures 33 cm in circumference on the left and 41 cm in circumference on the right.  Patient has palpable dorsalis pedis pulses she does have brawny edema.  All ulcers are healed.  Imaging: No results found. No images are attached to the encounter.  Labs: Lab Results  Component Value Date   HGBA1C 5.2 12/25/2022   HGBA1C 5.5 12/01/2021   HGBA1C 5.4 10/05/2017   LABURIC 5.2 02/05/2020   REPTSTATUS 07/10/2013 FINAL 07/09/2013   GRAMSTAIN  07/09/2013    ABUNDANT WBC PRESENT, PREDOMINANTLY PMN FEW GRAM NEGATIVE RODS Performed at [facility]   GRAMSTAIN  07/09/2013    ABUNDANT WBC PRESENT, PREDOMINANTLY PMN MODERATE GRAM NEGATIVE RODS Performed at [facility]   CULT NO GROWTH Performed at [facility] 07/09/2013   LABORGA KLEBSIELLA PNEUMONIAE 07/09/2013     Lab Results  Component Value Date   ALBUMIN 2.8 (L) 12/25/2022   ALBUMIN 3.6 12/24/2022   ALBUMIN 4.0 12/01/2021    Lab Results  Component Value Date   MG 2.3 12/27/2022   MG 2.3 12/24/2022   No results found for: "VD25OH"  No results found for: "PREALBUMIN"    Latest Ref Rng & Units  12/28/2022    4:38 AM 12/27/2022    4:45 AM 12/26/2022    5:00 AM  CBC EXTENDED  WBC 4.0 - 10.5 K/uL 8.1  12.5  13.6   RBC 3.87 - 5.11 MIL/uL 4.15  4.70  4.41   Hemoglobin 12.0 - 15.0 g/dL 65.7  84.6  96.2   HCT 36.0 - 46.0 % 42.1  47.8  45.2   Platelets 150 - 400 K/uL 269  301  294      There is no height or weight on file to calculate BMI.  Orders:  No orders of the defined types were placed in this encounter.  No orders of the defined types were placed in this encounter.    Procedures: No procedures performed  Clinical Data: No additional findings.  ROS:  All other systems negative, except as noted in the HPI. Review of Systems  Objective: Vital Signs: There were no vitals taken for this visit.  Specialty Comments:  No specialty comments available.  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   Hypokalemia 12/27/2022   Volume depletion 12/24/2022   Constipation 12/24/2022   Partial small bowel obstruction (HCC) 12/24/2022   Polycythemia 12/24/2022   NSAID long-term use 08/26/2022   Gastroesophageal reflux disease 08/26/2022   Bloating 08/26/2022  Small intestinal bacterial overgrowth (SIBO) 08/26/2022   Osteoarthritis 12/01/2021   Plantar wart 12/01/2021   Ventral hernia 01/01/2021   Diverticulosis 01/05/2019   Dermatitis 01/05/2019   Systolic murmur 07/25/2018   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   Hyperglycemia 10/05/2017   Leg edema 10/05/2017   Hammer toe 10/05/2017   Hot flashes due to menopause 10/05/2017   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/10/2013 07/11/2013   Hypertension 07/09/2013   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   Left Buccal Cheek (Keratoacanthoma)    Umbilical hernia     Family History  Problem Relation Age of Onset   Diabetes Mother    Heart attack Mother    CAD Father    Heart attack Father    Breast cancer Neg Hx    Colon cancer Neg Hx    Esophageal cancer Neg Hx    Rectal cancer Neg Hx    Stomach cancer Neg Hx     Past Surgical History:  Procedure Laterality Date   BOWEL RESECTION  07/09/2013   Procedure: SMALL BOWEL RESECTION;  Surgeon: Velora Heckler, MD;  Location: [facility];  Service: General;;   Brock Bad REPAIR Left 03/22/2023   Procedure: LEFT GLUTEUS MEDIUS REPAIR WITH POSSIBLE COLLAGEN PATCH AUGMENTATION;  Surgeon: Huel Cote, MD;  [facility];  Service: Orthopedics;  Laterality: Left;   LAPAROTOMY N/A 07/09/2013   Procedure: EXPLORATORY LAPAROTOMY;  Surgeon: Velora Heckler, MD;  Location: [facility];  Service: General;  Laterality: N/A;   SHOULDER ARTHROSCOPY WITH CAPSULORRHAPHY     Social History   Occupational History   Occupation: Retired   Tobacco Use   Smoking status: Former    Types: Cigarettes   Smokeless tobacco: Never  Vaping Use   Vaping status: Never Used  Substance and Sexual Activity   Alcohol use: Yes    Alcohol/week: 7.0 standard drinks of alcohol    Types: 7 Glasses of wine per week    Comment: nightly glass of wine   Drug use: No   Sexual activity: Not Currently

## 2023-04-23 ENCOUNTER — Ambulatory Visit (HOSPITAL_BASED_OUTPATIENT_CLINIC_OR_DEPARTMENT_OTHER): Payer: Medicare Other | Attending: Orthopaedic Surgery | Admitting: Physical Therapy

## 2023-04-23 ENCOUNTER — Encounter (HOSPITAL_BASED_OUTPATIENT_CLINIC_OR_DEPARTMENT_OTHER): Payer: Self-pay | Admitting: Physical Therapy

## 2023-04-23 DIAGNOSIS — R262 Difficulty in walking, not elsewhere classified: Secondary | ICD-10-CM | POA: Diagnosis not present

## 2023-04-23 DIAGNOSIS — M25652 Stiffness of left hip, not elsewhere classified: Secondary | ICD-10-CM | POA: Diagnosis not present

## 2023-04-23 DIAGNOSIS — M6281 Muscle weakness (generalized): Secondary | ICD-10-CM | POA: Insufficient documentation

## 2023-04-23 DIAGNOSIS — M5416 Radiculopathy, lumbar region: Secondary | ICD-10-CM | POA: Insufficient documentation

## 2023-04-23 DIAGNOSIS — M25552 Pain in left hip: Secondary | ICD-10-CM | POA: Insufficient documentation

## 2023-04-23 NOTE — Therapy (Signed)
OUTPATIENT PHYSICAL THERAPY TREATMENT   Patient Name: Ellen [name] MRN: [id number] DOB:1940-12-24, 82 y.o., female Today's Date: 04/23/2023  END OF SESSION:  PT End of Session - 04/23/23 1436     Visit Number 2    Number of Visits 25    Date for PT Re-Evaluation 07/07/23    Authorization Type MCR A & B    PT Start Time 1435    PT Stop Time 1515    PT Time Calculation (min) 40 min    Activity Tolerance Patient tolerated treatment well    Behavior During Therapy WFL for tasks assessed/performed             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   Left Buccal Cheek (Keratoacanthoma)   Umbilical hernia    Past Surgical History:  Procedure Laterality Date   BOWEL RESECTION  07/09/2013   Procedure: SMALL BOWEL RESECTION;  Surgeon: Velora Heckler, MD;  Location: [facility];  Service: General;;   Brock Bad REPAIR Left 03/22/2023   Procedure: LEFT GLUTEUS MEDIUS REPAIR WITH POSSIBLE COLLAGEN PATCH AUGMENTATION;  Surgeon: Huel Cote, MD;  Location: [facility];  Service: Orthopedics;  Laterality: Left;   LAPAROTOMY N/A 07/09/2013   Procedure: EXPLORATORY LAPAROTOMY;  Surgeon: Velora Heckler, MD;  Location: [facility];  Service: General;  Laterality: N/A;   SHOULDER ARTHROSCOPY WITH CAPSULORRHAPHY     Patient Active Problem List   Diagnosis Date Noted   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   Hypokalemia 12/27/2022   Volume depletion 12/24/2022   Constipation 12/24/2022   Partial small bowel obstruction (HCC) 12/24/2022   Polycythemia 12/24/2022   NSAID long-term use 08/26/2022   Gastroesophageal reflux disease 08/26/2022   Bloating 08/26/2022   Small intestinal bacterial overgrowth (SIBO) 08/26/2022   Osteoarthritis 12/01/2021   Plantar wart 12/01/2021    Ventral hernia 01/01/2021   Diverticulosis 01/05/2019   Dermatitis 01/05/2019   Systolic murmur 07/25/2018   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   Hyperglycemia 10/05/2017   Leg edema 10/05/2017   Hammer toe 10/05/2017   Hot flashes due to menopause 10/05/2017   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/10/2013 07/11/2013   Hypertension 07/09/2013    REFERRING PROVIDER: Huel Cote, MD  REFERRING DIAG: [phone] (ICD-10-CM) - Tendinopathy of gluteus medius  S/p glute med repair  THERAPY DIAG:  Pain in left hip  Muscle weakness (generalized)  Stiffness of left hip, not elsewhere classified  Difficulty in walking, not elsewhere classified  Rationale for Evaluation and Treatment: Rehabilitation  ONSET DATE: DOS 03/22/2023  SUBJECTIVE:   SUBJECTIVE STATEMENT: Patient states some lateral thigh pain.    EVAL: L hip pain began 1 year ago with no specific MOI.  Pt had PT last year and reports no improvement.  She states PT irritated her hip.      Pt had an intestinal obstruction in August.  She went to hospital and got an injection.  She was hospitalized and developed an ulcer in bilat heels.  Pt states her R heel has healed, and L heel is healing.  Pt is seeing MD for ulcers.  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  Pt underwent Left  hip gluteus medius repair and Left hip trochanteric bursectomy on 03/22/2023.  Post op plan indicated pt to be 50% Wb'ing x 2 weeks.  Pt received 4-5 visits of HHPT.  Pt is now WBAT per MD note.    Pt is limited with ambulation distance and unable to perform her walking program.  Pt has to use AD with ambulation.  Pt is limited with her gardening and bending.  Pt has difficulty with stairs.  Pt has difficulty with taking care of her husband.  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 Pt mowed her lawn yesterday.  PT instructed pt to not mow her  lawn.    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    PAIN:  NPRS:  2-3/10 current, 8/10 worst.  Worst pain is at the end of the day.  Location: L hip  PRECAUTIONS: Other: per surgical protocol, Ulcer on L heel/post ankle/achilles, lumbar anterolisthesis, double hernia    WEIGHT BEARING RESTRICTIONS: Yes WBAT  FALLS:  Has patient fallen in last 6 months? No  LIVING ENVIRONMENT: Lives with: lives with their spouse Lives in: home; lives on 1 floor though does have to go to basement. 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  Pt was ambulating without AD.    PATIENT GOALS: to walk straight and walk normally, improve strength  NEXT MD VISIT: 05/07/2023  OBJECTIVE:  Note: Objective measures were completed at Evaluation unless otherwise noted.  DIAGNOSTIC FINDINGS: Pt is post op.  PATIENT SURVEYS:  FOTO 55 with a goal of 73 at visit 15  COGNITION: Overall cognitive status: Within functional limits for tasks assessed    OBSERVATION:  Incision on lateral L hip is intact with no signs of infection.  Incision is clean and dry.  Healing ulcer on L posterior heel/ankle/achilles.  Pt removed her band-aid.  PT brought band-aids in to cover ulcer though pt declined band-aids.  She states she doesn't need a band aid and it will be fine returning home.    GAIT: Comments: Pt ambulating with SPC.  She denies pain with ambulation.  Slow gait speed.   Decreased toe off on L and increased stance time on R.    TODAY'S TREATMENT:                                                                                                                               04/23/23 STM to  quads, adductors, hip flexors PROM flexion, abduction  Glute set 10 x 10 second holdssecond holds Hip adduction isometrics 10 x 10 second holds Prone hip ER isometric 10 x 10 second holds LAQ 10 x 5 second holds Quadruped rocking 2 x 10    EVAL Pt performed quad sets with 5 sec hold approx 10-12 reps and posterior pelvic tilts 2x10.  Pt received a HEP handout and was educated in correct form and appropriate frequency.  PT instructed pt she should not have pain with HEP.  PT educated pt in protocol and post op restrictions including not performing hip abduction and not moving past 90 deg in hip flexion.  Pt had stated that she mowed her lawn and PT instructed her that she should not mow her lawn and should not perform yard work at this time.    PATIENT EDUCATION:  Education details: dx, relevant anatomy, HEP, POC, rationale of interventions, WBAT restrictions, and post op and protocol restrictions/limitations.   Person educated: Patient Education method: Explanation, Demonstration, Tactile cues, Verbal cues, and Handouts Education comprehension: verbalized understanding, returned demonstration, verbal cues required, tactile cues required, and needs further education  HOME EXERCISE PROGRAM: Access Code: [email or web address] URL: [email or web address] Date: 04/14/2023 Prepared by: Aaron Edelman  Exercises - Supine Quadricep Sets  - 2 x daily - 7 x weekly - 2 sets - 10 reps - 5 seconds hold - Supine Posterior Pelvic Tilt  - 2 x daily - 7 x weekly - 2 sets - 10 reps  04/23/23- Hooklying Gluteal Sets  - 1 x daily - 7 x weekly - 5 reps - 10 second hold - Supine Hip Adduction Isometric with Ball  - 1 x daily - 7 x weekly - 10 reps - 10 second hold - Seated Heel Squeeze  - 1  x daily - 7 x weekly - 10 reps - 10 second hold - Seated Long Arc Quad  - 1 x daily - 7 x weekly - 10 reps - 5 second hold  ASSESSMENT:  CLINICAL IMPRESSION: Patient with hyperactive and tender musculature throughout proximal L hip. Some improvement with Manual. Continued with proximal muscle activation exercises. Patient will continue to benefit from physical therapy in order to improve function and reduce impairment.   EVAL: Patient is an 82 y.o. female 3 weeks and 1 day s/p Left hip gluteus medius repair and trochanteric bursectomy.  Pt had ulcers in distal bilat LE's.  The R LE ulcer is healed and the L ulcer is healing.  Pt has completed HHPT.  She is ambulating with a SPC.  She is limited with ambulation distance and has difficulty with stairs.  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  Pt has gait deficits and L LE muscle weakness.  Pt should benefit from skilled PT services per protocol to address impairments and to improve overall function.       OBJECTIVE IMPAIRMENTS: Abnormal gait, decreased activity tolerance, decreased endurance, decreased mobility, difficulty walking, decreased ROM, decreased strength, hypomobility, and pain.   ACTIVITY LIMITATIONS: carrying, lifting, standing, squatting, stairs, transfers, and locomotion level  PARTICIPATION LIMITATIONS: cleaning and yard work  PERSONAL FACTORS: 3+ comorbidities: L LE ulcer, double hernia, arthritis  are also affecting patient's functional outcome.   REHAB POTENTIAL: Good  CLINICAL DECISION MAKING: Stable/uncomplicated  EVALUATION COMPLEXITY: Low   GOALS:   SHORT TERM GOALS:  Pt will be independent and compliant with HEP for improved pain, strength, and function. Baseline: Goal status: INITIAL Target date:  05/05/2023   2.  Pt will demo improved quality of gait with increased stance time and toe off  on L.   Baseline:  Goal status: INITIAL Target date:  05/12/2023  3.  Pt will progress with exercises per protocol without adverse effects for improved strength and mobility.   Baseline:  Goal status: INITIAL Target date:  05/26/2023   4.  Pt will report improved tolerance with standing activities and ambulation.  Baseline:  Goal status: INITIAL Target date:  06/16/2023   5.  Pt will ambulate with a normalized heel to toe gait without limping.  Baseline:  Goal status: INITIAL Target date:  06/23/2023   6.  Pt will be able to perform a 6 inch step up with good form and control with L LE leading for improved performance of stairs abd and improved functional strength. Baseline:  Goal status: INITIAL Target date:  06/30/2023   LONG TERM GOALS: Target date: 07/21/2023   Pt will be able to ambulate extended community distance without significant difficulty and pain.   Baseline:  Goal status: INITIAL  2.  Pt will be able to perform her ADLs and IADLs including household chores without significant difficulty and pain. Baseline:  Goal status: INITIAL  3.  Pt will be able to take care of her husband without significant limitations.   Baseline:  Goal status: INITIAL  4.  Pt will be able to perform mini squats with good form and without increased pain in order for improved functional LE strength and to assist with returning to gardening activities.   Baseline:  Goal status: INITIAL     PLAN:  PT FREQUENCY: 1x/week x 2-3 weeks and 2x/wk afterwards  PT DURATION: 12-14 weeks  PLANNED INTERVENTIONS: 97164- PT Re-evaluation, 97110-Therapeutic exercises, 97530- Therapeutic activity, [id number]- Neuromuscular re-education, 97535- Self Care, 09811- Manual therapy, [id number]- Gait training, [phone]- Aquatic Therapy, 97014- Electrical stimulation (unattended), [phone]- Ultrasound, Patient/Family education, Balance training, Stair training, Taping, Dry Needling, Joint mobilization, Scar mobilization,  Cryotherapy, and Moist heat  PLAN FOR NEXT SESSION:  Cont per Dr. Serena Croissant gluteus medius repair.  Review and perform HEP.  Assess hip PROM per protocol ranges.  Be aware of healing ulcer in distal L LE at post heel/ankle.     Reola Mosher Imunique Samad, PT 04/23/2023, 2:37 PM

## 2023-04-28 ENCOUNTER — Ambulatory Visit (INDEPENDENT_AMBULATORY_CARE_PROVIDER_SITE_OTHER): Payer: Medicare Other | Admitting: Family Medicine

## 2023-04-28 ENCOUNTER — Encounter: Payer: Self-pay | Admitting: Family Medicine

## 2023-04-28 VITALS — BP 145/83 | HR 74 | Temp 98.0°F | Ht 61.0 in | Wt 112.2 lb

## 2023-04-28 DIAGNOSIS — R600 Localized edema: Secondary | ICD-10-CM

## 2023-04-28 DIAGNOSIS — I10 Essential (primary) hypertension: Secondary | ICD-10-CM | POA: Diagnosis not present

## 2023-04-28 DIAGNOSIS — L97301 Non-pressure chronic ulcer of unspecified ankle limited to breakdown of skin: Secondary | ICD-10-CM | POA: Diagnosis not present

## 2023-04-28 DIAGNOSIS — L0390 Cellulitis, unspecified: Secondary | ICD-10-CM

## 2023-04-28 MED ORDER — DOXYCYCLINE HYCLATE 100 MG PO TABS: 100.0000 mg | ORAL_TABLET | Freq: Two times a day (BID) | ORAL | 0 refills | Status: DC

## 2023-04-28 NOTE — Assessment & Plan Note (Signed)
Blood pressure mildly elevated however at goal per JNC 8.  Continue losartan 50 mg daily and HCTZ 12.5 mg daily.

## 2023-04-28 NOTE — Progress Notes (Signed)
   Ellen [name] is a 82 y.o. female who presents today for an office visit.  Assessment/Plan:  New/Acute Problems: Cellulitis  Concern for mild cellulitis based on exam and history related to her recurrent foot ulcers.  No signs of systemic illness or other red flags.  Will start course of doxycycline.  She did well with this previously.  Chronic Problems Addressed Today: Skin ulcer of ankle, limited to breakdown of skin (HCC) Her ankle ulcers bilaterally do seem to be healing however we are treating mild cellulitis today as above.  She has had multiple recurrences of this over the last few months.  It is likely this is related to her venous insufficiency however given recurrence would be reasonable for [place] to check ABIs today to further assess arterial flow.  She is agreeable to ABIs.  In the meantime she will continue with management for her venous stasis including elevation, compression, and Lasix 40 mg daily.  Depending on results of ABI may need to refer her back to see vascular surgery.  Hypertension Blood pressure mildly elevated however at goal per JNC 8.  Continue losartan 50 mg daily and HCTZ 12.5 mg daily.  Leg edema Edema is fairly well-controlled.  She does have venous insufficiency based on prior ultrasounds.  She will continue with her treatment plan including compression, elevation, and Lasix 40 mg daily.     Subjective:  HPI:  See assessment / plan for status of chronic conditions. Patient is here today for follow up for bilateral ankle ulcers.  This has been ongoing for the last several months.We last saw her a couple of months ago for this.  At that time her edema and ulcers were improving after increasing dose of Lasix to 40 mg daily and completing a course of doxycycline.  She has also been following with orthopedics intermittently for this and last saw them about 3 weeks ago.  At that time her bilateral lower extremity venous ulcers had healed up completely.  Since  then she has had another recurrence.  She is having more pain and burning especially on the right side of her ankle.  Does have a small ulcer on the left as well which seems to be healing the last several days.  No fevers or chills.  No obvious precipitating events.  She has been trying to use her compression stockings and keep her legs elevated.       Objective:  Physical Exam: BP (!) 145/83   Pulse 74   Temp 98 F (36.7 C) (Temporal)   Ht 5\' 1"  (1.549 m)   Wt 112 lb 3.2 oz (50.9 kg)   SpO2 97%   BMI 21.20 kg/m   Gen: No acute distress, resting comfortably CV: Regular rate and rhythm with 26 systolic murmurs appreciated Pulm: Normal work of breathing, clear to auscultation bilaterally with no crackles, wheezes, or rhonchi Skin: Large 4 to 5 cm healing ulcer on posterior aspect of right foot just superior to calcaneus.  Two smaller 1-2 cm healing ulcers on posterior aspect of left foot.  She does have surrounding erythema and warmth bilaterally.  Tender to palpation. MUSCULOSKELETAL: DP and PT pulses not palpable bilaterally. Neuro: Grossly normal, moves all extremities Psych: Normal affect and thought content      Marieanne Marxen M. Jimmey Ralph, MD 04/28/2023 7:52 AM

## 2023-04-28 NOTE — Patient Instructions (Signed)
It was very nice to see you today!  Please start the doxycycline.  Will check an ultrasound to make sure that you are getting adequate blood flow to your legs.  Please keep an eye blood pressure and let us know if it is persistently elevated.  You can try using over-the-counter nasal sprays such as Astepro or Flonase to help with congestion.  Return if symptoms worsen or fail to improve.   Take care, Dr Jimmey Ralph  PLEASE NOTE:  If you had any lab tests, please let us know if you have not heard back within a few days. You may see your results on mychart before we have a chance to review them but we will give you a call once they are reviewed by [place].   If we ordered any referrals today, please let us know if you have not heard from their office within the next week.   If you had any urgent prescriptions sent in today, please check with the pharmacy within an hour of our visit to make sure the prescription was transmitted appropriately.   Please try these tips to maintain a healthy lifestyle:  Eat at least 3 REAL meals and 1-2 snacks per day.  Aim for no more than 5 hours between eating.  If you eat breakfast, please do so within one hour of getting up.   Each meal should contain half fruits/vegetables, one quarter protein, and one quarter carbs (no bigger than a computer mouse)  Cut down on sweet beverages. This includes juice, soda, and sweet tea.   Drink at least 1 glass of water with each meal and aim for at least 8 glasses per day  Exercise at least 150 minutes every week.

## 2023-04-28 NOTE — Assessment & Plan Note (Signed)
Her ankle ulcers bilaterally do seem to be healing however we are treating mild cellulitis today as above.  She has had multiple recurrences of this over the last few months.  It is likely this is related to her venous insufficiency however given recurrence would be reasonable for [place] to check ABIs today to further assess arterial flow.  She is agreeable to ABIs.  In the meantime she will continue with management for her venous stasis including elevation, compression, and Lasix 40 mg daily.  Depending on results of ABI may need to refer her back to see vascular surgery.

## 2023-04-28 NOTE — Assessment & Plan Note (Signed)
Edema is fairly well-controlled.  She does have venous insufficiency based on prior ultrasounds.  She will continue with her treatment plan including compression, elevation, and Lasix 40 mg daily.

## 2023-04-30 ENCOUNTER — Ambulatory Visit (HOSPITAL_BASED_OUTPATIENT_CLINIC_OR_DEPARTMENT_OTHER): Payer: Medicare Other | Admitting: Physical Therapy

## 2023-04-30 ENCOUNTER — Encounter (HOSPITAL_BASED_OUTPATIENT_CLINIC_OR_DEPARTMENT_OTHER): Payer: Self-pay | Admitting: Physical Therapy

## 2023-04-30 DIAGNOSIS — M25652 Stiffness of left hip, not elsewhere classified: Secondary | ICD-10-CM

## 2023-04-30 DIAGNOSIS — R262 Difficulty in walking, not elsewhere classified: Secondary | ICD-10-CM | POA: Diagnosis not present

## 2023-04-30 DIAGNOSIS — M25552 Pain in left hip: Secondary | ICD-10-CM | POA: Diagnosis not present

## 2023-04-30 DIAGNOSIS — M6281 Muscle weakness (generalized): Secondary | ICD-10-CM

## 2023-04-30 DIAGNOSIS — M5416 Radiculopathy, lumbar region: Secondary | ICD-10-CM | POA: Diagnosis not present

## 2023-04-30 NOTE — Therapy (Signed)
OUTPATIENT PHYSICAL THERAPY TREATMENT   Patient Name: Ellen [name] MRN: [id number] DOB:[birth year], 82 y.o., female Today's Date: 04/30/2023  END OF SESSION:  PT End of Session - 04/30/23 0920     Visit Number 3    Number of Visits 25    Date for PT Re-Evaluation 07/07/23    Authorization Type MCR A & B    PT Start Time 0847    PT Stop Time 0926    PT Time Calculation (min) 39 min    Activity Tolerance Patient tolerated treatment well    Behavior During Therapy [facility] for tasks assessed/performed              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   Left Buccal Cheek (Keratoacanthoma)   Umbilical hernia    Past Surgical History:  Procedure Laterality Date   BOWEL RESECTION  07/09/2013   Procedure: SMALL BOWEL RESECTION;  Surgeon: Velora Heckler, MD;  Location: [facility];  Service: General;;   Brock Bad REPAIR Left 03/22/2023   Procedure: LEFT GLUTEUS MEDIUS REPAIR WITH POSSIBLE COLLAGEN PATCH AUGMENTATION;  Surgeon: Huel Cote, MD;  Location: [facility];  Service: Orthopedics;  Laterality: Left;   LAPAROTOMY N/A 07/09/2013   Procedure: EXPLORATORY LAPAROTOMY;  Surgeon: Velora Heckler, MD;  Location: [facility];  Service: General;  Laterality: N/A;   SHOULDER ARTHROSCOPY WITH CAPSULORRHAPHY     Patient Active Problem List   Diagnosis Date Noted   Skin ulcer of ankle, limited to breakdown of skin (HCC) 04/28/2023   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   Hypokalemia 12/27/2022   Volume depletion 12/24/2022   Constipation 12/24/2022   Partial small bowel obstruction (HCC) 12/24/2022   Polycythemia 12/24/2022   Gastroesophageal reflux disease 08/26/2022   Bloating 08/26/2022   Small intestinal bacterial overgrowth (SIBO) 08/26/2022   Osteoarthritis  12/01/2021   Plantar wart 12/01/2021   Ventral hernia 01/01/2021   Diverticulosis 01/05/2019   Dermatitis 01/05/2019   Systolic murmur 07/25/2018   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   Hyperglycemia 10/05/2017   Leg edema 10/05/2017   Hammer toe 10/05/2017   Hot flashes due to menopause 10/05/2017   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/10/2013 07/11/2013   Hypertension 07/09/2013    REFERRING PROVIDER: Huel Cote, MD  REFERRING DIAG: [phone] (ICD-10-CM) - Tendinopathy of gluteus medius  S/p glute med repair  THERAPY DIAG:  Pain in left hip  Muscle weakness (generalized)  Stiffness of left hip, not elsewhere classified  Difficulty in walking, not elsewhere classified  Rationale for Evaluation and Treatment: Rehabilitation  ONSET DATE: DOS 03/22/2023  Days since surgery: 39   SUBJECTIVE:   SUBJECTIVE STATEMENT: Pt states the thigh is sore today. It is stiff.    EVAL: L hip pain began 1 year ago with no specific MOI.  Pt had PT last year and reports no improvement.  She states PT irritated her hip.      Pt had an intestinal obstruction in August.  She went to hospital and got an injection.  She was hospitalized and developed an ulcer in bilat heels.  Pt states her R heel has healed, and L heel is healing.  Pt is seeing MD for ulcers.  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  Pt underwent Left hip gluteus medius repair and Left hip trochanteric bursectomy on 03/22/2023.  Post op plan indicated pt to be 50% Wb'ing x 2 weeks.  Pt received 4-5 visits of HHPT.  Pt is now WBAT per MD note.    Pt is limited with ambulation distance and unable to perform her walking program.  Pt has to use AD with ambulation.  Pt is limited with her gardening and bending.  Pt has difficulty with stairs.  Pt has difficulty with taking care of her husband.  Pt is a caregiver for her husband who has peripheral neuropathy  and dropfoot. Pt mowed her lawn yesterday.  PT instructed pt to not mow her lawn.    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    PAIN:  NPRS:  4/10 current, 8/10 worst.  Worst pain is at the end of the day.  Location: L hip  PRECAUTIONS: Other: per surgical protocol, Ulcer on L heel/post ankle/achilles, lumbar anterolisthesis, double hernia    WEIGHT BEARING RESTRICTIONS: Yes WBAT  FALLS:  Has patient fallen in last 6 months? No  LIVING ENVIRONMENT: Lives with: lives with their spouse Lives in: home; lives on 1 floor though does have to go to basement. 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  Pt was ambulating without AD.    PATIENT GOALS: to walk straight and walk normally, improve strength  NEXT MD VISIT: 05/07/2023  OBJECTIVE:  Note: Objective measures were completed at Evaluation unless otherwise noted.  DIAGNOSTIC FINDINGS: Pt is post op.  PATIENT SURVEYS:  FOTO 55 with a goal of 73 at visit 15  COGNITION: Overall cognitive status: Within functional limits for tasks assessed    OBSERVATION:  Incision on lateral L hip is intact with no signs of infection.  Incision is clean and dry.  Healing ulcer on L posterior heel/ankle/achilles.  Pt removed her band-aid.  PT brought band-aids in to cover ulcer though pt declined band-aids.  She states she doesn't need a band aid and it will be fine returning home.    GAIT: Comments: Pt  ambulating with SPC.  She denies pain with ambulation.  Slow gait speed.  Decreased toe off on L and increased stance time on R.    TODAY'S TREATMENT:       12/14 STM to quads, adductors, hip flexors PROM flexion, abduction   Exercises - Seated Heel Squeeze  - 1 x daily - 7 x weekly - 10 reps - 10 second hold - Seated Long Arc Quad  - 1 x daily - 7 x weekly - 10 reps - 5 second hold - Supine Bridge  - 1 x daily - 7 x weekly - 2 sets - 10 reps - Supine Bridge with Mini Swiss Ball Between Knees  - 1 x daily - 7 x weekly - 2 sets - 10 reps - Supine Single Knee to Chest Stretch  -  2 x daily - 7 x weekly - 1 sets - 3 reps - 30 hold - Sidelying Quadriceps Stretch with Strap  - 2 x daily - 7 x weekly - 1 sets - 3 reps - 30 hold - Supine Piriformis Stretch with Foot on Ground  - 2 x daily - 7 x weekly - 1 sets - 3 reps - 30 hold                                                                                                                            04/23/23 STM to quads, adductors, hip flexors PROM flexion, abduction  Glute set 10 x 10 second holdssecond holds Hip adduction isometrics 10 x 10 second holds Prone hip ER isometric 10 x 10 second holds LAQ 10 x 5 second holds Quadruped rocking 2 x 10    EVAL Pt performed quad sets with 5 sec hold approx 10-12 reps and posterior pelvic tilts 2x10.  Pt received a HEP handout and was educated in correct form and appropriate frequency.  PT instructed pt she should not have pain with HEP.  PT educated pt in protocol and post op restrictions including not performing hip abduction and not moving past 90 deg in hip flexion.  Pt had stated that she mowed her lawn and PT instructed her that she should not mow her lawn and should not perform yard work at this time.    PATIENT EDUCATION:  Education details: dx, relevant anatomy, HEP, POC, rationale of interventions, WBAT restrictions, and post op and protocol restrictions/limitations.   Person educated:  Patient Education method: Explanation, Demonstration, Tactile cues, Verbal cues, and Handouts Education comprehension: verbalized understanding, returned demonstration, verbal cues required, tactile cues required, and needs further education  HOME EXERCISE PROGRAM: Access Code: [email or web address] URL: [email or web address] Date: 04/14/2023 Prepared by: Aaron Edelman  Exercises - Supine Quadricep Sets  - 2 x daily - 7 x weekly - 2 sets - 10 reps - 5 seconds hold - Supine Posterior Pelvic Tilt  - 2 x daily - 7 x weekly - 2 sets - 10 reps  04/23/23- Hooklying Gluteal Sets  - 1 x daily - 7 x weekly - 5 reps - 10 second hold - Supine Hip Adduction Isometric with Ball  - 1 x daily - 7 x weekly - 10 reps - 10 second hold - Seated Heel Squeeze  - 1 x daily - 7 x weekly - 10 reps - 10 second hold - Seated Long Arc Quad  - 1 x daily - 7 x weekly - 10 reps - 5 second hold  ASSESSMENT:  CLINICAL IMPRESSION: Pt now 5 wks post op and able to progress to next phase of rehab protocol and reach beyond 90 of hip flexion with mild tightness at end range. HEP progressed for gentle ROM and muscle activation. Pt's L quad continues to be hypertonus and cause mild pain. Pt advised on self massage down the thigh and use of  heat for increasing soft tissue extensibility. Advised to avoid percussion massage directly over glute med repair site. Plan for manual PRN for pain management.  Patient will continue to benefit from physical therapy in order to improve function and reduce impairment.   EVAL: Patient is an 82 y.o. female 3 weeks and 1 day s/p Left hip gluteus medius repair and trochanteric bursectomy.  Pt had ulcers in distal bilat LE's.  The R LE ulcer is healed and the L ulcer is healing.  Pt has completed HHPT.  She is ambulating with a SPC.  She is limited with ambulation distance and has difficulty with stairs.  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  Pt is a caregiver for  her husband and has difficulty with taking care of her husband.  Pt has gait deficits and L LE muscle weakness.  Pt should benefit from skilled PT services per protocol to address impairments and to improve overall function.       OBJECTIVE IMPAIRMENTS: Abnormal gait, decreased activity tolerance, decreased endurance, decreased mobility, difficulty walking, decreased ROM, decreased strength, hypomobility, and pain.   ACTIVITY LIMITATIONS: carrying, lifting, standing, squatting, stairs, transfers, and locomotion level  PARTICIPATION LIMITATIONS: cleaning and yard work  PERSONAL FACTORS: 3+ comorbidities: L LE ulcer, double hernia, arthritis  are also affecting patient's functional outcome.   REHAB POTENTIAL: Good  CLINICAL DECISION MAKING: Stable/uncomplicated  EVALUATION COMPLEXITY: Low   GOALS:   SHORT TERM GOALS:  Pt will be independent and compliant with HEP for improved pain, strength, and function. Baseline: Goal status: INITIAL Target date:  05/05/2023   2.  Pt will demo improved quality of gait with increased stance time and toe off on L.   Baseline:  Goal status: INITIAL Target date:  05/12/2023  3.  Pt will progress with exercises per protocol without adverse effects for improved strength and mobility.   Baseline:  Goal status: INITIAL Target date:  05/26/2023   4.  Pt will report improved tolerance with standing activities and ambulation.  Baseline:  Goal status: INITIAL Target date:  06/16/2023   5.  Pt will ambulate with a normalized heel to toe gait without limping.  Baseline:  Goal status: INITIAL Target date:  06/23/2023   6.  Pt will be able to perform a 6 inch step up with good form and control with L LE leading for improved performance of stairs abd and improved functional strength. Baseline:  Goal status: INITIAL Target date:  06/30/2023   LONG TERM GOALS: Target date: 07/21/2023   Pt will be able to ambulate extended community distance without  significant difficulty and pain.   Baseline:  Goal status: INITIAL  2.  Pt will be able to perform her ADLs and IADLs including household chores without significant difficulty and pain. Baseline:  Goal status: INITIAL  3.  Pt will be able to take care of her husband without significant limitations.   Baseline:  Goal status: INITIAL  4.  Pt will be able to perform mini squats with good form and without increased pain in order for improved functional LE strength and to assist with returning to gardening activities.   Baseline:  Goal status: INITIAL     PLAN:  PT FREQUENCY: 1x/week x 2-3 weeks and 2x/wk afterwards  PT DURATION: 12-14 weeks  PLANNED INTERVENTIONS: 97164- PT Re-evaluation, 97110-Therapeutic exercises, 97530- Therapeutic activity, [id number]- Neuromuscular re-education, 97535- Self Care, [id number]- Manual therapy, [id number]- Gait training, [id number]- Aquatic Therapy, 97014-  Electrical stimulation (unattended), [id number]- Ultrasound, Patient/Family education, Balance training, Stair training, Taping, Dry Needling, Joint mobilization, Scar mobilization, Cryotherapy, and Moist heat  PLAN FOR NEXT SESSION:  Cont per Dr. Serena Croissant gluteus medius repair.  Review and perform HEP.  Assess hip PROM per protocol ranges.  Be aware of healing ulcer in distal L LE at post heel/ankle.     Zebedee Iba, PT 04/30/2023, 9:34 AM

## 2023-05-04 ENCOUNTER — Ambulatory Visit: Payer: Medicare Other | Admitting: Internal Medicine

## 2023-05-04 ENCOUNTER — Encounter: Payer: Self-pay | Admitting: Internal Medicine

## 2023-05-04 VITALS — BP 108/68 | HR 88 | Ht 61.0 in | Wt 112.5 lb

## 2023-05-04 DIAGNOSIS — R140 Abdominal distension (gaseous): Secondary | ICD-10-CM | POA: Diagnosis not present

## 2023-05-04 DIAGNOSIS — Z8719 Personal history of other diseases of the digestive system: Secondary | ICD-10-CM | POA: Diagnosis not present

## 2023-05-04 DIAGNOSIS — R1032 Left lower quadrant pain: Secondary | ICD-10-CM

## 2023-05-04 DIAGNOSIS — K5710 Diverticulosis of small intestine without perforation or abscess without bleeding: Secondary | ICD-10-CM | POA: Diagnosis not present

## 2023-05-04 NOTE — Patient Instructions (Addendum)
You have been scheduled for a CT scan of the abdomen and pelvis at [facility], 1st floor Radiology. You are scheduled on 05/13/23 at 1:45 pm. You should arrive 15 minutes prior to your appointment time for registration drink contrast 2 pm scan will start at 4 pm  You may take any medications as prescribed with a small amount of water, if necessary. If you take any of the following medications: METFORMIN, GLUCOPHAGE, GLUCOVANCE, AVANDAMET, RIOMET, FORTAMET, ACTOPLUS MET, JANUMET, GLUMETZA or METAGLIP, you MAY be asked to HOLD this medication 48 hours AFTER the exam.   The purpose of you drinking the oral contrast is to aid in the visualization of your intestinal tract. The contrast solution may cause some diarrhea. Depending on your individual set of symptoms, you may also receive an intravenous injection of x-ray contrast/dye. Plan on being at [facility] for 45 minutes or longer, depending on the type of exam you are having performed.   If you have any questions regarding your exam or if you need to reschedule, you may call [facility] at [phone] between the hours of 8:00 am and 5:00 pm, Monday-Friday.     If your blood pressure at your visit was 140/90 or greater, please contact your primary care physician to follow up on this.  If you are age 72 or older, your body mass index should be between 23-30. Your There is no height or weight on file to calculate BMI. If this is out of the aforementioned range listed, please consider follow up with your Primary Care Provider.  If you are age 39 or younger, your body mass index should be between 19-25. Your There is no height or weight on file to calculate BMI. If this is out of the aformentioned range listed, please consider follow up with your Primary Care Provider.    The [facility] providers would like to encourage you to use [facility] to communicate with providers for non-urgent requests or questions.  Due to long hold times on  the telephone, sending your provider a message by [facility] may be a faster and more efficient way to get a response.  Please allow 48 business hours for a response.  Please remember that this is for non-urgent requests.  _______________________________________________________   Due to recent changes in healthcare laws, you may see the results of your imaging and laboratory studies on MyChart before your provider has had a chance to review them.  We understand that in some cases there may be results that are confusing or concerning to you. Not all laboratory results come back in the same time frame and the provider may be waiting for multiple results in order to interpret others.  Please give [place] 48 hours in order for your provider to thoroughly review all the results before contacting the office for clarification of your results.    Thank you for entrusting me with your care and for choosing [facility],  Dr. Eulah Pont

## 2023-05-04 NOTE — Progress Notes (Signed)
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  She actually had CT proven jejunal diverticulitis in 2016 involving a 3.5 cm diverticulum.  Imaging also showed colonic diverticulosis.  Interval History: She is 3 weeks out of a left gluteal tear repair surgery.  She was told that she will have to do several months of physical therapy in order to fully recover from this.  Ever since her hospitalization for SBO in 12/2022, she still feels like she may have some residual symptoms. Sometimes she has really bad pain on her left lower abdomen. However, passing stools has been fine. Stools are normal size. She is having bloating and gas. Gas-X and Tums have not helped. She has tried pantoprazole, which seems to help.The bloating is not affected by what she eats. She is on doxycyline currently for cellulitis and a heel ulcer. She is going to be tested on Thursday for ultrasound to make sure that the circulation looks okay in her legs. She wasn't able to take Ensure but she is trying to gain weight. She is following a modified low fiber diet. Her goal is 117 lbs.   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   Left Buccal Cheek (Keratoacanthoma)   Umbilical hernia    Past Surgical History:  Procedure Laterality Date   BOWEL RESECTION  07/09/2013   Procedure: SMALL BOWEL RESECTION;  Surgeon: Velora Heckler, MD;  Location: [facility];  Service:  General;;   Brock Bad REPAIR Left 03/22/2023   Procedure: LEFT GLUTEUS MEDIUS REPAIR WITH POSSIBLE COLLAGEN PATCH AUGMENTATION;  Surgeon: Huel Cote, MD;  Location: [facility];  Service: Orthopedics;  Laterality: Left;   LAPAROTOMY N/A 07/09/2013   Procedure: EXPLORATORY LAPAROTOMY;  Surgeon: Velora Heckler, MD;  Location: [facility];  Service: General;  Laterality: N/A;   SHOULDER ARTHROSCOPY WITH CAPSULORRHAPHY      reports that she has quit smoking. Her smoking use included cigarettes. She has never used smokeless tobacco. She reports current alcohol use of about 7.0 standard drinks of alcohol per week. She reports that she does not use drugs. family history includes CAD in her father; Diabetes in her mother; Heart attack in her father and mother. Allergies  Allergen Reactions   Lactose Intolerance (Gi) Diarrhea   Sulfa Antibiotics Nausea And Vomiting and Swelling      Outpatient Encounter Medications as of 05/04/2023  Medication Sig   Calcium Carbonate Antacid (TUMS PO) Take 1 tablet by mouth as needed (reflux).   Carboxymethylcellulose Sodium (EYE DROPS OP) Place 2 drops into both eyes daily. Thera Tears   doxycycline (VIBRA-TABS) 100 MG tablet Take 1 tablet (100 mg total) by mouth 2 (two) times daily.   furosemide (LASIX) 40 MG tablet Take 1 tablet (40 mg total) by mouth daily.   hydrochlorothiazide (MICROZIDE) 12.5 MG capsule TAKE 1 CAPSULE(12.5 MG) BY MOUTH DAILY  losartan (COZAAR) 50 MG tablet TAKE 1 TABLET(50 MG) BY MOUTH DAILY   mupirocin ointment (BACTROBAN) 2 % Apply 1 Application topically 2 (two) times daily.   pantoprazole (PROTONIX) 40 MG tablet TAKE 1 TABLET(40 MG) BY MOUTH DAILY   PREMPRO 0.45-1.5 MG tablet TAKE 1 TABLET BY MOUTH DAILY (Patient taking differently: Take 1 tablet by mouth daily. Once per week)   oxyCODONE (ROXICODONE) 5 MG immediate release tablet Take 1 tablet (5 mg total) by mouth every 4 (four) hours as needed for severe pain (pain  score 7-10) or breakthrough pain. (Patient not taking: Reported on 05/04/2023)   [DISCONTINUED] aspirin EC 325 MG tablet Take 1 tablet (325 mg total) by mouth daily.   No facility-administered encounter medications on file as of 05/04/2023.    PHYSICAL EXAM: BP 108/68   Pulse 88   Ht 5\' 1"  (1.549 m)   Wt 112 lb 8 oz (51 kg)   SpO2 93%   BMI 21.26 kg/m  General: Well developed white female in no acute distress Head: Normocephalic and atraumatic Lungs: No increased WOB Heart: Regular rate Abdomen: Soft, non-distended.  Scars from surgery noted along with hernia in mid-abdomen as well.  Non-tender.  BS present. Extremities: 1+ BLE edema Neurological: Alert oriented x 4, grossly non-focal Psychological:  Alert and cooperative. Normal mood and affect  Labs 12/2022: CBC unremarkable. BMP unremarkable. CMP with low albumin of 2.8 and normal LFTs. Lipase nml.   CT A/P w/contrast 12/24/22: IMPRESSION: Mildly dilated fluid-filled loops of small bowel diffusely with transition at the level of the distal ileum where there is some fold thickening and stranding. Please correlate for etiology including differential of infectious, inflammatory and ischemic process. Trace free fluid and mesenteric stranding. No free air. Colonic diverticulosis. Mild dilatation of the biliary tree but normal tapering of the common duct towards the pancreatic head. 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  EGD 10/07/22: - Normal esophagus.  - Four gastric polyps. Resected and retrieved.  - Gastritis. Biopsied.  - Normal examined duodenum. Path: 1. Surgical [P], gastric antrum and gastric body - ANTRAL MUCOSA WITH CHEMICAL/REACTIVE GASTROPATHY. - OXYNTIC MUCOSA WITH MILD CHEMICAL/REACTIVE CHANGE. - NO HELICOBACTER PYLORI ORGANISMS IDENTIFIED ON H&E STAINED SLIDE. 2. Surgical [P], gastric polyps-4 - POLYPOID FRAGMENTS OF PREDOMINANTLY OXYNTIC TYPE MUCOSA WITH OVERLAPPING FEATURES OF  BOTH A FUNDIC GLAND POLYP AND HYPERPLASTIC POLYP WITH FOCAL EROSION.  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  She has been passing stools without issues but has continued to have some intermittent left lower quadrant abdominal pain as well as bloating issues.  Will obtain a CT scan to ensure that she does not have any alternative explanations for her abdominal pain and bloating, such as diverticulitis.  Patient does have a history of SIBO that has been responsive to rifaximin therapy.  However she is currently already on doxycycline therapy for treatment of a heel ulcer and cellulitis so I am hesitant to put her on another antibiotic at this time.  Doxycycline can be used as a treatment for SIBO at times, though it is typically not as effective as rifaximin therapy. - Previously gave handout on low fiber diet - Patient is on doxycyline - Try to avoid NSAID use - CT A/P w/contrast at [facility]. If negative, then can consider  flexible sigmoidoscopy to look for SCAD   Eulah Pont, MD  I spent 37 minutes of time, including in depth chart review, independent review of results as outlined above, communicating results with the patient directly, face-to-face time with the patient, coordinating care, ordering studies and medications as appropriate, and documentation.

## 2023-05-05 ENCOUNTER — Ambulatory Visit (HOSPITAL_BASED_OUTPATIENT_CLINIC_OR_DEPARTMENT_OTHER): Payer: Medicare Other | Admitting: Physical Therapy

## 2023-05-05 DIAGNOSIS — M5416 Radiculopathy, lumbar region: Secondary | ICD-10-CM | POA: Diagnosis not present

## 2023-05-05 DIAGNOSIS — M25652 Stiffness of left hip, not elsewhere classified: Secondary | ICD-10-CM | POA: Diagnosis not present

## 2023-05-05 DIAGNOSIS — M6281 Muscle weakness (generalized): Secondary | ICD-10-CM

## 2023-05-05 DIAGNOSIS — R262 Difficulty in walking, not elsewhere classified: Secondary | ICD-10-CM | POA: Diagnosis not present

## 2023-05-05 DIAGNOSIS — M25552 Pain in left hip: Secondary | ICD-10-CM

## 2023-05-05 NOTE — Therapy (Signed)
OUTPATIENT PHYSICAL THERAPY TREATMENT   Patient Name: Ellen [name] MRN: [id number] DOB:12/19/1940, 82 y.o., female Today's Date: 05/06/2023  END OF SESSION:  PT End of Session - 05/05/23 1401     Visit Number 4    Number of Visits 25    Date for PT Re-Evaluation 07/07/23    Authorization Type MCR A & B    PT Start Time 1318    PT Stop Time 1348    PT Time Calculation (min) 30 min    Activity Tolerance Patient tolerated treatment well    Behavior During Therapy WFL for tasks assessed/performed               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   Left Buccal Cheek (Keratoacanthoma)   Umbilical hernia    Past Surgical History:  Procedure Laterality Date   BOWEL RESECTION  07/09/2013   Procedure: SMALL BOWEL RESECTION;  Surgeon: Velora Heckler, MD;  Location: [facility];  Service: General;;   Brock Bad REPAIR Left 03/22/2023   Procedure: LEFT GLUTEUS MEDIUS REPAIR WITH POSSIBLE COLLAGEN PATCH AUGMENTATION;  Surgeon: Huel Cote, MD;  Location: [facility];  Service: Orthopedics;  Laterality: Left;   LAPAROTOMY N/A 07/09/2013   Procedure: EXPLORATORY LAPAROTOMY;  Surgeon: Velora Heckler, MD;  Location: [facility];  Service: General;  Laterality: N/A;   SHOULDER ARTHROSCOPY WITH CAPSULORRHAPHY     Patient Active Problem List   Diagnosis Date Noted   Skin ulcer of ankle, limited to breakdown of skin (HCC) 04/28/2023   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   Hypokalemia 12/27/2022   Volume depletion 12/24/2022   Constipation 12/24/2022   Partial small bowel obstruction (HCC) 12/24/2022   Polycythemia 12/24/2022   Gastroesophageal reflux disease 08/26/2022   Bloating 08/26/2022   Small intestinal bacterial overgrowth (SIBO) 08/26/2022   Osteoarthritis  12/01/2021   Plantar wart 12/01/2021   Ventral hernia 01/01/2021   Diverticulosis 01/05/2019   Dermatitis 01/05/2019   Systolic murmur 07/25/2018   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   Hyperglycemia 10/05/2017   Leg edema 10/05/2017   Hammer toe 10/05/2017   Hot flashes due to menopause 10/05/2017   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/10/2013 07/11/2013   Hypertension 07/09/2013    REFERRING PROVIDER: Huel Cote, MD  REFERRING DIAG: [phone] (ICD-10-CM) - Tendinopathy of gluteus medius  S/p glute med repair  THERAPY DIAG:  Pain in left hip  Muscle weakness (generalized)  Stiffness of left hip, not elsewhere classified  Difficulty in walking, not elsewhere classified  Rationale for Evaluation and Treatment: Rehabilitation  ONSET DATE: DOS 03/22/2023  Days since surgery: 44   SUBJECTIVE:   SUBJECTIVE STATEMENT: Pt is 6 weeks and 2 days s/p Left hip gluteus medius repair and trochanteric bursectomy.  Pt states she needs to cut PT short today due to being the caregiver for husband.  He had eye surgery and she needs to get back to him. "It's getting better."  Pt denies pain currently.  Pt had soreness the evening after prior Rx though no adverse effects.  Pt states she has mowed her lawn and plans to mow her lawn today.  PT instructed pt to not mow her lawn.  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    PAIN:  NPRS:  0/10 current, 8/10 worst.  Worst pain is at the end of the day.  Location: L hip  PRECAUTIONS: Other: per surgical protocol, Ulcer on L heel/post ankle/achilles, lumbar anterolisthesis, double hernia    WEIGHT BEARING RESTRICTIONS: Yes  WBAT  FALLS:  Has patient fallen in last 6 months? No  LIVING ENVIRONMENT: Lives with: lives with their spouse Lives in: home; lives on 1 floor though does have to go to basement. 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  Pt was ambulating without AD.    PATIENT GOALS: to walk straight and walk normally, improve strength  NEXT MD VISIT: 05/07/2023  OBJECTIVE:  Note: Objective measures were completed at Evaluation unless otherwise noted.  DIAGNOSTIC FINDINGS: Pt is post op.  PATIENT SURVEYS:  FOTO 55 with a goal of 73 at visit 15  COGNITION: Overall cognitive status: Within functional limits for tasks assessed    OBSERVATION:  Incision on lateral L hip is intact with no signs of infection.  Incision is clean and dry.  Healing ulcer on L posterior heel/ankle/achilles.  Pt removed her band-aid.  PT brought band-aids in to cover ulcer though pt declined band-aids.  She states she doesn't need a band aid and it will be fine returning home.    GAIT: Comments: Pt ambulating with SPC.  She denies pain with ambulation.  Slow gait speed.  Decreased toe off on L and increased stance time on R.    TODAY'S TREATMENT:     12/18  Supine bridge 2x10 Qped rocking 2x10 LAQ 2x10 Seated hip flex submax isometric 2x5 reps with 5 sec hold Hooklying hip add isometric with 5 sec hold x 10 reps  Pt received R hip PROM in flexion and abduction in supine per pt and tissue tolerance w/n protocol ranges  12/14 STM to quads, adductors, hip flexors PROM flexion, abduction   Exercises - Seated Heel Squeeze  - 1 x daily - 7 x weekly - 10 reps - 10 second hold - Seated Long Arc Quad  - 1 x daily - 7 x weekly - 10 reps - 5 second hold - Supine  Bridge  - 1 x daily - 7 x weekly - 2 sets - 10 reps - Supine Bridge with Mini Swiss Ball Between Knees  - 1 x daily - 7 x weekly - 2 sets - 10 reps - Supine Single Knee to Chest Stretch  - 2 x daily - 7 x weekly - 1 sets - 3 reps - 30 hold - Sidelying Quadriceps Stretch with Strap  - 2 x daily - 7 x weekly - 1 sets - 3 reps - 30 hold - Supine Piriformis Stretch with Foot on Ground  - 2 x daily - 7 x weekly - 1 sets - 3 reps - 30 hold  04/23/23 STM to quads, adductors, hip flexors PROM flexion, abduction  Glute set 10 x 10 second holdssecond holds Hip adduction isometrics 10 x 10 second holds Prone hip ER isometric 10 x 10 second holds LAQ 10 x 5 second holds Quadruped rocking 2 x 10      PATIENT EDUCATION:  Education details: dx, relevant anatomy, HEP, POC, rationale of interventions, WBAT restrictions, and post op and protocol restrictions/limitations.  PT instructed pt to not mow her lawn.  Person educated: Patient Education method: Explanation, Demonstration, Tactile cues, Verbal cues, and Handouts Education comprehension: verbalized understanding, returned demonstration, verbal cues required, tactile cues required, and needs further education  HOME EXERCISE PROGRAM: Access Code: [email or web address] URL: [email or web address] Date: 04/14/2023 Prepared by: Aaron Edelman  Exercises - Supine Quadricep Sets  - 2 x daily - 7 x weekly - 2 sets - 10 reps - 5 seconds hold - Supine Posterior Pelvic Tilt  - 2 x daily - 7 x weekly - 2 sets - 10 reps  04/23/23- Hooklying Gluteal Sets  - 1 x daily - 7 x weekly - 5 reps - 10 second hold - Supine Hip Adduction Isometric with Ball  - 1 x daily - 7 x weekly - 10 reps - 10 second hold - Seated Heel Squeeze  - 1 x daily - 7 x weekly - 10 reps - 10 second hold - Seated Long Arc Quad  - 1 x daily - 7 x weekly - 10 reps - 5  second hold  ASSESSMENT:  CLINICAL IMPRESSION: Pt is 6 wks and 2 days post op.  Treatment time limited today due to pt needing to leave early to take of her husband who just had surgery.  Pt has mowed her lawn and plans to continue to mow her lawn in spite of PT instruction to not mow her lawn.  PT performed hip PROM per pt and tissue tolerance w/n protocol ranges.  Pt performed exercises per protocol well without c/o's.  Pt states her hip was a little tender after Rx and reported increased pain to 4/10.  Pt should benefit from cont skilled PT to address impairments and goals and to improve function.    OBJECTIVE IMPAIRMENTS: Abnormal gait, decreased activity tolerance, decreased endurance, decreased mobility, difficulty walking, decreased ROM, decreased strength, hypomobility, and pain.   ACTIVITY LIMITATIONS: carrying, lifting, standing, squatting, stairs, transfers, and locomotion level  PARTICIPATION LIMITATIONS: cleaning and yard work  PERSONAL FACTORS: 3+ comorbidities: L LE ulcer, double hernia, arthritis  are also affecting patient's functional outcome.   REHAB POTENTIAL: Good  CLINICAL DECISION MAKING: Stable/uncomplicated  EVALUATION COMPLEXITY: Low   GOALS:   SHORT TERM GOALS:  Pt will be independent and compliant with HEP for improved pain, strength, and function. Baseline: Goal status: INITIAL Target date:  05/05/2023   2.  Pt will demo improved quality of gait with increased stance time and toe off on L.   Baseline:  Goal status: INITIAL Target date:  05/12/2023  3.  Pt will progress with exercises per protocol without adverse effects for improved strength and mobility.   Baseline:  Goal status: INITIAL Target date:  05/26/2023   4.  Pt will report improved tolerance with standing activities and ambulation.  Baseline:  Goal status: INITIAL Target date:  06/16/2023   5.  Pt will ambulate with a normalized heel to toe gait without limping.  Baseline:  Goal  status: INITIAL Target date:  06/23/2023   6.  Pt will be able to perform  a 6 inch step up with good form and control with L LE leading for improved performance of stairs abd and improved functional strength. Baseline:  Goal status: INITIAL Target date:  06/30/2023   LONG TERM GOALS: Target date: 07/21/2023   Pt will be able to ambulate extended community distance without significant difficulty and pain.   Baseline:  Goal status: INITIAL  2.  Pt will be able to perform her ADLs and IADLs including household chores without significant difficulty and pain. Baseline:  Goal status: INITIAL  3.  Pt will be able to take care of her husband without significant limitations.   Baseline:  Goal status: INITIAL  4.  Pt will be able to perform mini squats with good form and without increased pain in order for improved functional LE strength and to assist with returning to gardening activities.   Baseline:  Goal status: INITIAL     PLAN:  PT FREQUENCY: 1x/week x 2-3 weeks and 2x/wk afterwards  PT DURATION: 12-14 weeks  PLANNED INTERVENTIONS: 97164- PT Re-evaluation, 97110-Therapeutic exercises, 97530- Therapeutic activity, [id number]- Neuromuscular re-education, 97535- Self Care, 16109- Manual therapy, [id number]- Gait training, [phone]- Aquatic Therapy, 97014- Electrical stimulation (unattended), [phone]- Ultrasound, Patient/Family education, Balance training, Stair training, Taping, Dry Needling, Joint mobilization, Scar mobilization, Cryotherapy, and Moist heat  PLAN FOR NEXT SESSION:  Cont per Dr. Serena Croissant gluteus medius repair.  Be aware of healing ulcer in distal L LE at post heel/ankle.     Audie Clear III PT, DPT 05/06/23 3:43 PM

## 2023-05-06 ENCOUNTER — Ambulatory Visit (HOSPITAL_COMMUNITY)
Admission: RE | Admit: 2023-05-06 | Discharge: 2023-05-06 | Disposition: A | Discharge status: Home / Self Care | Payer: Medicare Other | Source: Ambulatory Visit | Attending: Family Medicine | Admitting: Family Medicine

## 2023-05-06 ENCOUNTER — Encounter (HOSPITAL_BASED_OUTPATIENT_CLINIC_OR_DEPARTMENT_OTHER): Payer: Self-pay | Admitting: Physical Therapy

## 2023-05-06 DIAGNOSIS — L97301 Non-pressure chronic ulcer of unspecified ankle limited to breakdown of skin: Secondary | ICD-10-CM | POA: Diagnosis not present

## 2023-05-06 LAB — VAS US ABI WITH/WO TBI
Left ABI: 1.08
Right ABI: 1.08

## 2023-05-07 ENCOUNTER — Ambulatory Visit (HOSPITAL_BASED_OUTPATIENT_CLINIC_OR_DEPARTMENT_OTHER): Payer: Medicare Other | Admitting: Orthopaedic Surgery

## 2023-05-07 DIAGNOSIS — M67959 Unspecified disorder of synovium and tendon, unspecified thigh: Secondary | ICD-10-CM

## 2023-05-07 NOTE — Progress Notes (Signed)
Post Operative Evaluation    Procedure/Date of Surgery: Left hip gluteus medius repair 11/4  Interval History:    Presents today 6 weeks status post the above procedure.  She has been progressing her weightbearing on the side.  Overall she is doing very well.  She is back to mowing her lawn with some soreness but no pain  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   Left Buccal Cheek (Keratoacanthoma)   Umbilical hernia    Past Surgical History:  Procedure Laterality Date   BOWEL RESECTION  07/09/2013   Procedure: SMALL BOWEL RESECTION;  Surgeon: Velora Heckler, MD;  Location: [facility];  Service: General;;   Brock Bad REPAIR Left 03/22/2023   Procedure: LEFT GLUTEUS MEDIUS REPAIR WITH POSSIBLE COLLAGEN PATCH AUGMENTATION;  Surgeon: Huel Cote, MD;  Location: [facility];  Service: Orthopedics;  Laterality: Left;   LAPAROTOMY N/A 07/09/2013   Procedure: EXPLORATORY LAPAROTOMY;  Surgeon: Velora Heckler, MD;  Location: [facility];  Service: General;  Laterality: N/A;   SHOULDER ARTHROSCOPY WITH CAPSULORRHAPHY     Social History   Socioeconomic History   Marital status: Married    Spouse name: Not on file   Number of children: 0   Years of education: Not on file   Highest education level: Not on file  Occupational History   Occupation: Retired   Tobacco Use   Smoking status: Former    Types: Cigarettes   Smokeless tobacco: Never  Vaping Use   Vaping status: Never Used  Substance and Sexual Activity   Alcohol use: Yes    Alcohol/week: 7.0 standard drinks of alcohol    Types: 7 Glasses of wine per week    Comment: nightly glass of wine   Drug use: No   Sexual activity: Not  Currently  Other Topics Concern   Not on file  Social History Narrative   Enjoys gardening    Social Drivers of Health   Financial Resource Strain: Low Risk  (07/16/2022)   Overall Financial Resource Strain (CARDIA)    Difficulty of Paying Living Expenses: Not hard at all  Food Insecurity: No Food Insecurity (12/30/2022)   Hunger Vital Sign    Worried About Running Out of Food in the Last Year: Never true    Ran Out of Food in the Last Year: Never true  Transportation Needs: No Transportation Needs (12/30/2022)   PRAPARE - Administrator, Civil Service (Medical): No    Lack of Transportation (Non-Medical): No  Physical Activity: Sufficiently Active (07/16/2022)   Exercise Vital Sign    Days of Exercise per Week: 5 days    Minutes of Exercise per Session: 120 min  Stress: No Stress Concern Present (07/16/2022)   [employer] of Occupational Health - Occupational Stress Questionnaire    Feeling of Stress : Not at all  Social Connections: Moderately Integrated (07/16/2022)   Social Connection and Isolation Panel [NHANES]    Frequency of Communication with Friends and Family: More than three times a  week    Frequency of Social Gatherings with Friends and Family: More than three times a week    Attends Religious Services: More than 4 times per year    Active Member of [employer] or Organizations: No    Attends Engineer, structural: Never    Marital Status: Married   Family History  Problem Relation Age of Onset   Diabetes Mother    Heart attack Mother    CAD Father    Heart attack Father    Breast cancer Neg Hx    Colon cancer Neg Hx    Esophageal cancer Neg Hx    Rectal cancer Neg Hx    Stomach cancer Neg Hx    Allergies  Allergen Reactions   Lactose Intolerance (Gi) Diarrhea   Sulfa Antibiotics Nausea And Vomiting and Swelling   Current Outpatient Medications  Medication Sig Dispense Refill   Calcium Carbonate Antacid (TUMS PO) Take 1 tablet by mouth  as needed (reflux).     Carboxymethylcellulose Sodium (EYE DROPS OP) Place 2 drops into both eyes daily. Thera Tears     doxycycline (VIBRA-TABS) 100 MG tablet Take 1 tablet (100 mg total) by mouth 2 (two) times daily. 14 tablet 0   furosemide (LASIX) 40 MG tablet Take 1 tablet (40 mg total) by mouth daily. 30 tablet 5   hydrochlorothiazide (MICROZIDE) 12.5 MG capsule TAKE 1 CAPSULE(12.5 MG) BY MOUTH DAILY 90 capsule 1   losartan (COZAAR) 50 MG tablet TAKE 1 TABLET(50 MG) BY MOUTH DAILY 90 tablet 1   mupirocin ointment (BACTROBAN) 2 % Apply 1 Application topically 2 (two) times daily. 30 g 3   oxyCODONE (ROXICODONE) 5 MG immediate release tablet Take 1 tablet (5 mg total) by mouth every 4 (four) hours as needed for severe pain (pain score 7-10) or breakthrough pain. (Patient not taking: Reported on 05/04/2023) 30 tablet 0   pantoprazole (PROTONIX) 40 MG tablet TAKE 1 TABLET(40 MG) BY MOUTH DAILY 90 tablet 0   PREMPRO 0.45-1.5 MG tablet TAKE 1 TABLET BY MOUTH DAILY (Patient taking differently: Take 1 tablet by mouth daily. Once per week) 28 tablet 0   No current facility-administered medications for this visit.   VAS [place] ABI WITH/WO TBI Result Date: 05/06/2023  LOWER EXTREMITY DOPPLER STUDY Patient Name:  [facility]en [name]  Date of Exam:   05/06/2023 Medical Rec #: [id number]           Accession #:    [id number] Date of Birth: [birth year]          Patient Gender: F Patient Age:   40 years Exam Location:  [facility] Procedure:      VAS [place] ABI WITH/WO TBI Referring Phys: CALEB PARKER --------------------------------------------------------------------------------  Indications: Ulceration. High Risk Factors: Hypertension, past history of smoking.  Comparison Study: No previous exams Performing Technologist: Hill, Jody RVT, RDMS  Examination Guidelines: A complete evaluation includes at minimum, Doppler waveform signals and systolic blood pressure reading at the level of bilateral brachial, anterior  tibial, and posterior tibial arteries, when vessel segments are accessible. Bilateral testing is considered an integral part of a complete examination. Photoelectric Plethysmograph (PPG) waveforms and toe systolic pressure readings are included as required and additional duplex testing as needed. Limited examinations for reoccurring indications may be performed as noted.  ABI Findings: +---------+------------------+-----+---------+--------+ Right    Rt Pressure (mmHg)IndexWaveform Comment  +---------+------------------+-----+---------+--------+ Brachial 158  triphasic         +---------+------------------+-----+---------+--------+ PTA      157               0.99 biphasic          +---------+------------------+-----+---------+--------+ DP       171               1.08 biphasic          +---------+------------------+-----+---------+--------+ Great Toe136               0.86 Normal            +---------+------------------+-----+---------+--------+ +---------+------------------+-----+---------+-------+ Left     Lt Pressure (mmHg)IndexWaveform Comment +---------+------------------+-----+---------+-------+ Brachial 159                    triphasic        +---------+------------------+-----+---------+-------+ PTA      156               0.98 biphasic         +---------+------------------+-----+---------+-------+ DP       171               1.08 biphasic         +---------+------------------+-----+---------+-------+ Daleen Squibb               0.86 Normal           +---------+------------------+-----+---------+-------+ +-------+-----------+-----------+------------+------------+ ABI/TBIToday's ABIToday's TBIPrevious ABIPrevious TBI +-------+-----------+-----------+------------+------------+ Right  1.08       0.86                                +-------+-----------+-----------+------------+------------+ Left   1.08       0.86                                 +-------+-----------+-----------+------------+------------+  Summary: Right: Resting right ankle-brachial index is within normal range. The right toe-brachial index is normal. Left: Resting left ankle-brachial index is within normal range. The left toe-brachial index is normal. *See table(s) above for measurements and observations.  Electronically signed by Carolynn Sayers on 05/06/2023 at 12:06:40 PM.    Final     Review of Systems:   A ROS was performed including pertinent positives and negatives as documented in the HPI.   Musculoskeletal Exam:    There were no vitals taken for this visit.  Left hip 30 degrees internal/external rotation without pain.  Incision is well-appearing without erythema or drainage.  Distal neurosensory exam is intact  Imaging:      I personally reviewed and interpreted the radiographs.   Assessment:   6 weeks status post left hip gluteus medius repair.  At this time we will continue to work through physical therapy for strengthening and endurance.  I will plan to see her back in 6 weeks for reassessment  Plan :    -Return to clinic 6 weeks for reassessment      I personally saw and evaluated the patient, and participated in the management and treatment plan.  Huel Cote, MD Attending Physician, Orthopedic Surgery  This document was dictated using Dragon voice recognition software. A reasonable attempt at proof reading has been made to minimize errors.

## 2023-05-07 NOTE — Progress Notes (Signed)
Good news!  Her ultrasound shows that her blood flow to her legs is normal.  Her issue is getting blood back up from her legs.  She should let us know if her foot swelling and ulceration is not improving.  Ellen [name]. Jimmey Ralph, MD 05/07/2023 3:39 PM

## 2023-05-10 NOTE — Therapy (Signed)
OUTPATIENT PHYSICAL THERAPY TREATMENT   Patient Name: Ellen [name] MRN: [id number] DOB:February 14, 1941, 82 y.o., female Today's Date: 05/15/2023  END OF SESSION:  PT End of Session - 05/15/23 1152     Visit Number 5    Number of Visits 25    Date for PT Re-Evaluation 07/07/23    Authorization Type MCR A & B    Progress Note Due on Visit 10    PT Start Time 1128    PT Stop Time 1210    PT Time Calculation (min) 42 min    Activity Tolerance Patient tolerated treatment well    Behavior During Therapy WFL for tasks assessed/performed                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   Left Buccal Cheek (Keratoacanthoma)   Umbilical hernia    Past Surgical History:  Procedure Laterality Date   BOWEL RESECTION  07/09/2013   Procedure: SMALL BOWEL RESECTION;  Surgeon: Velora Heckler, MD;  Location: [facility];  Service: General;;   Brock Bad REPAIR Left 03/22/2023   Procedure: LEFT GLUTEUS MEDIUS REPAIR WITH POSSIBLE COLLAGEN PATCH AUGMENTATION;  Surgeon: Huel Cote, MD;  Location: [facility];  Service: Orthopedics;  Laterality: Left;   LAPAROTOMY N/A 07/09/2013   Procedure: EXPLORATORY LAPAROTOMY;  Surgeon: Velora Heckler, MD;  Location: [facility];  Service: General;  Laterality: N/A;   SHOULDER ARTHROSCOPY WITH CAPSULORRHAPHY     Patient Active Problem List   Diagnosis Date Noted   Skin ulcer of ankle, limited to breakdown of skin (HCC) 04/28/2023   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   Hypokalemia 12/27/2022   Volume depletion 12/24/2022   Constipation 12/24/2022   Partial small bowel obstruction (HCC) 12/24/2022   Polycythemia 12/24/2022   Gastroesophageal reflux disease 08/26/2022   Bloating 08/26/2022   Small intestinal bacterial overgrowth  (SIBO) 08/26/2022   Osteoarthritis 12/01/2021   Plantar wart 12/01/2021   Ventral hernia 01/01/2021   Diverticulosis 01/05/2019   Dermatitis 01/05/2019   Systolic murmur 07/25/2018   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   Hyperglycemia 10/05/2017   Leg edema 10/05/2017   Hammer toe 10/05/2017   Hot flashes due to menopause 10/05/2017   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/10/2013 07/11/2013   Hypertension 07/09/2013    REFERRING PROVIDER: Huel Cote, MD  REFERRING DIAG: [phone] (ICD-10-CM) - Tendinopathy of gluteus medius  S/p glute med repair  THERAPY DIAG:  Pain in left hip  Muscle weakness (generalized)  Stiffness of left hip, not elsewhere classified  Radiculopathy, lumbar region  Difficulty in walking, not elsewhere classified  Rationale for Evaluation and Treatment: Rehabilitation  ONSET DATE: DOS 03/22/2023  Days since surgery: 54   SUBJECTIVE:   SUBJECTIVE STATEMENT: Pt states that she is having more pain today than usual. She has been really busy the past few days. She notes increased pain in the small of her back as well from the gait abnormalities she has encountered.   Pt is 6 weeks and 2 days s/p Left hip gluteus medius repair and trochanteric bursectomy.  Pt states she needs to cut PT short today due to being the caregiver for husband.  He had eye surgery and she needs  to get back to him. "It's getting better."  Pt denies pain currently.  Pt had soreness the evening after prior Rx though no adverse effects.  Pt states she has mowed her lawn and plans to mow her lawn today.  PT instructed pt to not mow her lawn.      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    PAIN:  NPRS:  0/10 current, 8/10 worst.  Worst pain is at the end of the day.  Location: L hip  PRECAUTIONS: Other: per surgical protocol, Ulcer on L heel/post ankle/achilles, lumbar anterolisthesis, double hernia    WEIGHT BEARING RESTRICTIONS: Yes WBAT  FALLS:  Has patient fallen in last 6 months? No  LIVING ENVIRONMENT: Lives with: lives with their spouse Lives in: home; lives on 1 floor though does have to go to basement. 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  Pt was ambulating without AD.    PATIENT GOALS: to walk straight and walk normally, improve strength  NEXT MD VISIT: 05/07/2023  OBJECTIVE:  Note: Objective measures were completed at Evaluation unless otherwise noted.  DIAGNOSTIC FINDINGS: Pt is post op.  PATIENT SURVEYS:  FOTO 55 with a goal of 73 at visit 15  COGNITION: Overall cognitive status: Within functional limits for tasks assessed    OBSERVATION:  Incision on lateral L hip is intact with no signs of infection.  Incision is clean and dry.  Healing ulcer on L posterior heel/ankle/achilles.  Pt removed her band-aid.  PT brought band-aids in to cover ulcer though pt declined band-aids.  She states she doesn't need a band aid and it will be fine returning home.    GAIT: Comments: Pt ambulating with SPC.  She denies pain with ambulation.  Slow gait speed.  Decreased toe off on L and increased stance time on R.    TODAY'S TREATMENT:    12/28 Nustep lvl 3, 5 min PPT x20  Supine bridge 2x10 Qped rocking 2x10 LAQ 2x10 Seated lateral flexion QL stretch  Gait around clinic with [facility] for gait analysis.  Pt received R hip PROM in flexion and abduction in supine per pt and tissue  tolerance w/n protocol ranges  12/18 Supine bridge 2x10 Qped rocking 2x10 LAQ 2x10 Seated hip flex submax isometric 2x5 reps with 5 sec hold Hooklying hip add isometric with 5 sec hold x 10 reps  Pt received R hip PROM in flexion and abduction in supine per pt and tissue tolerance w/n protocol ranges  12/14 STM to quads, adductors, hip flexors PROM flexion, abduction   Exercises - Seated Heel Squeeze  - 1 x daily - 7 x weekly - 10 reps - 10 second hold - Seated Long Arc Quad  - 1 x daily - 7 x weekly - 10 reps - 5 second hold - Supine Bridge  - 1 x daily - 7 x weekly - 2 sets - 10 reps - Supine Bridge with Mini Swiss Ball Between Knees  - 1 x daily -  7 x weekly - 2 sets - 10 reps - Supine Single Knee to Chest Stretch  - 2 x daily - 7 x weekly - 1 sets - 3 reps - 30 hold - Sidelying Quadriceps Stretch with Strap  - 2 x daily - 7 x weekly - 1 sets - 3 reps - 30 hold - Supine Piriformis Stretch with Foot on Ground  - 2 x daily - 7 x weekly - 1 sets - 3 reps - 30 hold                                                                                                                            04/23/23 STM to quads, adductors, hip flexors PROM flexion, abduction  Glute set 10 x 10 second holdssecond holds Hip adduction isometrics 10 x 10 second holds Prone hip ER isometric 10 x 10 second holds LAQ 10 x 5 second holds Quadruped rocking 2 x 10      PATIENT EDUCATION:  Education details: dx, relevant anatomy, HEP, POC, rationale of interventions, WBAT restrictions, and post op and protocol restrictions/limitations.  PT instructed pt to not mow her lawn.  Person educated: Patient Education method: Explanation, Demonstration, Tactile cues, Verbal cues, and Handouts Education comprehension: verbalized understanding, returned demonstration, verbal cues required, tactile cues required, and needs further education  HOME EXERCISE PROGRAM: Access Code: [email or web address] URL:  [email or web address] Date: 04/14/2023 Prepared by: Aaron Edelman  Exercises - Supine Quadricep Sets  - 2 x daily - 7 x weekly - 2 sets - 10 reps - 5 seconds hold - Supine Posterior Pelvic Tilt  - 2 x daily - 7 x weekly - 2 sets - 10 reps  04/23/23- Hooklying Gluteal Sets  - 1 x daily - 7 x weekly - 5 reps - 10 second hold - Supine Hip Adduction Isometric with Ball  - 1 x daily - 7 x weekly - 10 reps - 10 second hold - Seated Heel Squeeze  - 1 x daily - 7 x weekly - 10 reps - 10 second hold - Seated Long Arc Quad  - 1 x daily - 7 x weekly - 10 reps - 5 second hold  ASSESSMENT:  CLINICAL IMPRESSION: Pt is progressing well per protocol. She is now 6 weeks and is demonstrating great ROM with limitations into IR/ER as expected. Reviewed lower lumbar stretches to help reduce tension from lateral flexion with walking. Also addressed gait with improvements noted as she progressed around clinic. Pt departs with no residual pain today.  Pt should benefit from cont skilled PT to address impairments and goals and to improve function.    OBJECTIVE IMPAIRMENTS: Abnormal gait, decreased activity tolerance, decreased endurance, decreased mobility, difficulty walking, decreased ROM, decreased strength, hypomobility, and pain.   ACTIVITY LIMITATIONS: carrying, lifting, standing, squatting, stairs, transfers, and locomotion level  PARTICIPATION LIMITATIONS: cleaning and yard work  PERSONAL FACTORS: 3+ comorbidities: L LE ulcer, double hernia, arthritis  are also affecting patient's functional  outcome.   REHAB POTENTIAL: Good  CLINICAL DECISION MAKING: Stable/uncomplicated  EVALUATION COMPLEXITY: Low   GOALS:   SHORT TERM GOALS:  Pt will be independent and compliant with HEP for improved pain, strength, and function. Baseline: Goal status: INITIAL Target date:  05/05/2023   2.  Pt will demo improved quality of gait with increased stance time and toe off on L.   Baseline:  Goal  status: INITIAL Target date:  05/12/2023  3.  Pt will progress with exercises per protocol without adverse effects for improved strength and mobility.   Baseline:  Goal status: INITIAL Target date:  05/26/2023   4.  Pt will report improved tolerance with standing activities and ambulation.  Baseline:  Goal status: INITIAL Target date:  06/16/2023   5.  Pt will ambulate with a normalized heel to toe gait without limping.  Baseline:  Goal status: INITIAL Target date:  06/23/2023   6.  Pt will be able to perform a 6 inch step up with good form and control with L LE leading for improved performance of stairs abd and improved functional strength. Baseline:  Goal status: INITIAL Target date:  06/30/2023   LONG TERM GOALS: Target date: 07/21/2023   Pt will be able to ambulate extended community distance without significant difficulty and pain.   Baseline:  Goal status: INITIAL  2.  Pt will be able to perform her ADLs and IADLs including household chores without significant difficulty and pain. Baseline:  Goal status: INITIAL  3.  Pt will be able to take care of her husband without significant limitations.   Baseline:  Goal status: INITIAL  4.  Pt will be able to perform mini squats with good form and without increased pain in order for improved functional LE strength and to assist with returning to gardening activities.   Baseline:  Goal status: INITIAL     PLAN:  PT FREQUENCY: 1x/week x 2-3 weeks and 2x/wk afterwards  PT DURATION: 12-14 weeks  PLANNED INTERVENTIONS: 97164- PT Re-evaluation, 97110-Therapeutic exercises, 97530- Therapeutic activity, [id number]- Neuromuscular re-education, 97535- Self Care, [id number]- Manual therapy, [id number]- Gait training, [id number]- Aquatic Therapy, 97014- Electrical stimulation (unattended), [phone]- Ultrasound, Patient/Family education, Balance training, Stair training, Taping, Dry Needling, Joint mobilization, Scar mobilization, Cryotherapy, and Moist  heat  PLAN FOR NEXT SESSION:  Cont per Dr. Serena Croissant gluteus medius repair.  Be aware of healing ulcer in distal L LE at post heel/ankle.    Royal Hawthorn PT, DPT 05/15/23  12:12 PM

## 2023-05-11 ENCOUNTER — Telehealth: Payer: Self-pay | Admitting: *Deleted

## 2023-05-11 NOTE — Telephone Encounter (Signed)
Copied from CRM [id number]. Topic: General - Other >> May 11, 2023 11:11 AM Adele Barthel wrote: Reason for CRM: Pt is returning call from Dr. Lavone Neri PA, a voicemail was left on her phone this morning. Advised there were no notes in chart about a call from the clinic, pt confirms phone number that contacted her was from clinic. May be pertaining to recent ultrasound. Pt requested call back, CB# [phone] (M)   [phone] (H)    See [place] results

## 2023-05-11 NOTE — Progress Notes (Signed)
Recommend she come back here or go back to Dr Lajoyce Corners soon if still not improving.

## 2023-05-13 ENCOUNTER — Encounter (HOSPITAL_BASED_OUTPATIENT_CLINIC_OR_DEPARTMENT_OTHER): Payer: Medicare Other | Admitting: Physical Therapy

## 2023-05-13 ENCOUNTER — Ambulatory Visit (HOSPITAL_BASED_OUTPATIENT_CLINIC_OR_DEPARTMENT_OTHER): Payer: Medicare Other

## 2023-05-15 ENCOUNTER — Ambulatory Visit (HOSPITAL_BASED_OUTPATIENT_CLINIC_OR_DEPARTMENT_OTHER): Payer: Medicare Other | Admitting: Physical Therapy

## 2023-05-15 DIAGNOSIS — M6281 Muscle weakness (generalized): Secondary | ICD-10-CM

## 2023-05-15 DIAGNOSIS — M25652 Stiffness of left hip, not elsewhere classified: Secondary | ICD-10-CM | POA: Diagnosis not present

## 2023-05-15 DIAGNOSIS — M5416 Radiculopathy, lumbar region: Secondary | ICD-10-CM | POA: Diagnosis not present

## 2023-05-15 DIAGNOSIS — M25552 Pain in left hip: Secondary | ICD-10-CM

## 2023-05-15 DIAGNOSIS — R262 Difficulty in walking, not elsewhere classified: Secondary | ICD-10-CM | POA: Diagnosis not present

## 2023-05-17 ENCOUNTER — Ambulatory Visit (HOSPITAL_BASED_OUTPATIENT_CLINIC_OR_DEPARTMENT_OTHER): Payer: Medicare Other

## 2023-05-17 ENCOUNTER — Encounter (HOSPITAL_BASED_OUTPATIENT_CLINIC_OR_DEPARTMENT_OTHER): Payer: Self-pay

## 2023-05-17 DIAGNOSIS — M25552 Pain in left hip: Secondary | ICD-10-CM | POA: Diagnosis not present

## 2023-05-17 DIAGNOSIS — M25652 Stiffness of left hip, not elsewhere classified: Secondary | ICD-10-CM

## 2023-05-17 DIAGNOSIS — M5416 Radiculopathy, lumbar region: Secondary | ICD-10-CM

## 2023-05-17 DIAGNOSIS — R262 Difficulty in walking, not elsewhere classified: Secondary | ICD-10-CM | POA: Diagnosis not present

## 2023-05-17 DIAGNOSIS — M6281 Muscle weakness (generalized): Secondary | ICD-10-CM | POA: Diagnosis not present

## 2023-05-17 NOTE — Telephone Encounter (Signed)
Copied from CRM [id number]. Topic: Clinical - Medical Advice >> May 17, 2023  2:56 PM Kathryne Eriksson wrote: Reason for CRM: Ulcers On Both Feet (Heels)   Left message to return call to our office at their convenience.

## 2023-05-17 NOTE — Telephone Encounter (Signed)
Pt returned call. Would like call back

## 2023-05-17 NOTE — Therapy (Signed)
OUTPATIENT PHYSICAL THERAPY TREATMENT   Patient Name: Ellen [name] MRN: [id number] DOB:[birth year], 82 y.o., female Today's Date: 05/17/2023  END OF SESSION:  PT End of Session - 05/17/23 1316     Visit Number 6    Number of Visits 25    Date for PT Re-Evaluation 07/07/23    Authorization Type MCR A & B    Progress Note Due on Visit 10    PT Start Time 1104    PT Stop Time 1145    PT Time Calculation (min) 41 min    Activity Tolerance Patient tolerated treatment well    Behavior During Therapy WFL for tasks assessed/performed                 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   Left Buccal Cheek (Keratoacanthoma)   Umbilical hernia    Past Surgical History:  Procedure Laterality Date   BOWEL RESECTION  07/09/2013   Procedure: SMALL BOWEL RESECTION;  Surgeon: Velora Heckler, MD;  Location: [facility];  Service: General;;   [place] REPAIR Left 03/22/2023   Procedure: LEFT GLUTEUS MEDIUS REPAIR WITH POSSIBLE COLLAGEN PATCH AUGMENTATION;  Surgeon: Huel Cote, MD;  Location: [facility];  Service: Orthopedics;  Laterality: Left;   LAPAROTOMY N/A 07/09/2013   Procedure: EXPLORATORY LAPAROTOMY;  Surgeon: Velora Heckler, MD;  Location: [facility];  Service: General;  Laterality: N/A;   SHOULDER ARTHROSCOPY WITH CAPSULORRHAPHY     Patient Active Problem List   Diagnosis Date Noted   Skin ulcer of ankle, limited to breakdown of skin (HCC) 04/28/2023   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   Hypokalemia 12/27/2022   Volume depletion 12/24/2022   Constipation 12/24/2022   Partial small bowel obstruction (HCC) 12/24/2022   Polycythemia 12/24/2022   Gastroesophageal reflux disease 08/26/2022   Bloating 08/26/2022   Small intestinal bacterial overgrowth  (SIBO) 08/26/2022   Osteoarthritis 12/01/2021   Plantar wart 12/01/2021   Ventral hernia 01/01/2021   Diverticulosis 01/05/2019   Dermatitis 01/05/2019   Systolic murmur 07/25/2018   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   Hyperglycemia 10/05/2017   Leg edema 10/05/2017   Hammer toe 10/05/2017   Hot flashes due to menopause 10/05/2017   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/10/2013 07/11/2013   Hypertension 07/09/2013    REFERRING PROVIDER: Huel Cote, MD  REFERRING DIAG: [phone] (ICD-10-CM) - Tendinopathy of gluteus medius  S/p glute med repair  THERAPY DIAG:  Pain in left hip  Muscle weakness (generalized)  Stiffness of left hip, not elsewhere classified  Radiculopathy, lumbar region  Rationale for Evaluation and Treatment: Rehabilitation  ONSET DATE: DOS 03/22/2023  Days since surgery: 56   SUBJECTIVE:   SUBJECTIVE STATEMENT: Pt reports 2/10 pain level in lateral hip. Was a 7-8/10 this morning when she woke up. Tries not to lay on her L side, but can't help it.   Pt is 6 weeks and 2 days s/p Left hip gluteus medius repair and trochanteric bursectomy.  Pt states she needs to cut PT short today due to being the caregiver for husband.  He had eye surgery and she needs to get back to him. "It's getting better."  Pt denies pain currently.  Pt had  soreness the evening after prior Rx though no adverse effects.  Pt states she has mowed her lawn and plans to mow her lawn today.  PT instructed pt to not mow her lawn.      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    PAIN:  NPRS:  2/10 current, 8/10 worst.  Worst pain is at the end of the day.  Location: L  hip  PRECAUTIONS: Other: per surgical protocol, Ulcer on L heel/post ankle/achilles, lumbar anterolisthesis, double hernia    WEIGHT BEARING RESTRICTIONS: Yes WBAT  FALLS:  Has patient fallen in last 6 months? No  LIVING ENVIRONMENT: Lives with: lives with their spouse Lives in: home; lives on 1 floor though does have to go to basement. 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  Pt was ambulating without AD.    PATIENT GOALS: to walk straight and walk normally, improve strength  NEXT MD VISIT: 05/07/2023  OBJECTIVE:  Note: Objective measures were completed at Evaluation unless otherwise noted.  DIAGNOSTIC FINDINGS: Pt is post op.  PATIENT SURVEYS:  FOTO 55 with a goal of 73 at visit 15  COGNITION: Overall cognitive status: Within functional limits for tasks assessed    OBSERVATION:  Incision on lateral L hip is intact with no signs of infection.  Incision is clean and dry.  Healing ulcer on L posterior heel/ankle/achilles.  Pt removed her band-aid.  PT brought band-aids in to cover ulcer though pt declined band-aids.  She states she doesn't need a band aid and it will be fine returning home.    GAIT: Comments: Pt ambulating with SPC.  She denies pain with ambulation.  Slow gait speed.  Decreased toe off on L and increased stance time on R.    TODAY'S TREATMENT:     12/30 Nustep lvl 3, 5 min PPT x20  Supine bridge 2x10 Hip flexor isometric in seated 5" 2x10 Hip abduction isometric- seated 5" 2x10 R hip PROM STM to hip addcuctors   12/28 Nustep lvl 3, 5 min PPT x20  Supine bridge 2x10 Qped rocking 2x10 LAQ 2x10 Seated lateral flexion QL stretch  Gait around clinic with [facility] for gait analysis.  Pt received R  hip PROM in flexion and abduction in supine per pt and tissue tolerance w/n protocol ranges  12/18 Supine bridge 2x10 Qped rocking 2x10 LAQ 2x10 Seated hip flex submax isometric 2x5 reps with 5 sec hold Hooklying hip add isometric with 5 sec hold x 10 reps  Pt received R hip PROM in flexion and abduction in supine per pt and tissue tolerance w/n protocol ranges  12/14 STM to quads, adductors, hip flexors PROM flexion, abduction   Exercises - Seated Heel Squeeze  - 1 x daily - 7 x weekly - 10 reps - 10 second hold - Seated Long Arc Quad  - 1 x daily - 7 x weekly - 10 reps - 5 second hold - Supine Bridge  - 1 x daily - 7 x weekly -  2 sets - 10 reps - Supine Bridge with Mini Swiss Ball Between Knees  - 1 x daily - 7 x weekly - 2 sets - 10 reps - Supine Single Knee to Chest Stretch  - 2 x daily - 7 x weekly - 1 sets - 3 reps - 30 hold - Sidelying Quadriceps Stretch with Strap  - 2 x daily - 7 x weekly - 1 sets - 3 reps - 30 hold - Supine Piriformis Stretch with Foot on Ground  - 2 x daily - 7 x weekly - 1 sets - 3 reps - 30 hold                                                                                                                            04/23/23 STM to quads, adductors, hip flexors PROM flexion, abduction  Glute set 10 x 10 second holdssecond holds Hip adduction isometrics 10 x 10 second holds Prone hip ER isometric 10 x 10 second holds LAQ 10 x 5 second holds Quadruped rocking 2 x 10      PATIENT EDUCATION:  Education details: dx, relevant anatomy, HEP, POC, rationale of interventions, WBAT restrictions, and post op and protocol restrictions/limitations.  PT instructed pt to not mow her lawn.  Person educated: Patient Education method: Explanation, Demonstration, Tactile cues, Verbal cues, and Handouts Education comprehension: verbalized understanding, returned demonstration, verbal cues required, tactile cues required, and needs further education  HOME EXERCISE  PROGRAM: Access Code: [email or web address] URL: [email or web address] Date: 04/14/2023 Prepared by: Aaron Edelman  Exercises - Supine Quadricep Sets  - 2 x daily - 7 x weekly - 2 sets - 10 reps - 5 seconds hold - Supine Posterior Pelvic Tilt  - 2 x daily - 7 x weekly - 2 sets - 10 reps  04/23/23- Hooklying Gluteal Sets  - 1 x daily - 7 x weekly - 5 reps - 10 second hold - Supine Hip Adduction Isometric with Ball  - 1 x daily - 7 x weekly - 10 reps - 10 second hold - Seated Heel Squeeze  - 1 x daily - 7 x weekly - 10 reps - 10 second hold - Seated Long Arc Quad  - 1 x daily - 7 x weekly - 10 reps - 5 second hold  ASSESSMENT:  CLINICAL IMPRESSION: Remains limited in hip IR and ER. Tightness and tenderness throughout adductor mm. Spent time on STM to this area to decrease tightness. Able to initiate seated hip abduction isometrics with cues to remain within pain limitations. Will continue to monitor pain level and progress as tolerated.    OBJECTIVE IMPAIRMENTS: Abnormal gait, decreased activity tolerance, decreased endurance, decreased mobility, difficulty walking, decreased ROM, decreased strength, hypomobility, and pain.   ACTIVITY LIMITATIONS: carrying, lifting, standing, squatting, stairs, transfers, and locomotion level  PARTICIPATION LIMITATIONS: cleaning and yard work  PERSONAL FACTORS: 3+ comorbidities: L LE ulcer, double hernia, arthritis  are also affecting patient's functional outcome.  REHAB POTENTIAL: Good  CLINICAL DECISION MAKING: Stable/uncomplicated  EVALUATION COMPLEXITY: Low   GOALS:   SHORT TERM GOALS:  Pt will be independent and compliant with HEP for improved pain, strength, and function. Baseline: Goal status: INITIAL Target date:  05/05/2023   2.  Pt will demo improved quality of gait with increased stance time and toe off on L.   Baseline:  Goal status: INITIAL Target date:  05/12/2023  3.  Pt will progress with exercises per protocol  without adverse effects for improved strength and mobility.   Baseline:  Goal status: INITIAL Target date:  05/26/2023   4.  Pt will report improved tolerance with standing activities and ambulation.  Baseline:  Goal status: INITIAL Target date:  06/16/2023   5.  Pt will ambulate with a normalized heel to toe gait without limping.  Baseline:  Goal status: INITIAL Target date:  06/23/2023   6.  Pt will be able to perform a 6 inch step up with good form and control with L LE leading for improved performance of stairs abd and improved functional strength. Baseline:  Goal status: INITIAL Target date:  06/30/2023   LONG TERM GOALS: Target date: 07/21/2023   Pt will be able to ambulate extended community distance without significant difficulty and pain.   Baseline:  Goal status: INITIAL  2.  Pt will be able to perform her ADLs and IADLs including household chores without significant difficulty and pain. Baseline:  Goal status: INITIAL  3.  Pt will be able to take care of her husband without significant limitations.   Baseline:  Goal status: INITIAL  4.  Pt will be able to perform mini squats with good form and without increased pain in order for improved functional LE strength and to assist with returning to gardening activities.   Baseline:  Goal status: INITIAL     PLAN:  PT FREQUENCY: 1x/week x 2-3 weeks and 2x/wk afterwards  PT DURATION: 12-14 weeks  PLANNED INTERVENTIONS: 97164- PT Re-evaluation, 97110-Therapeutic exercises, 97530- Therapeutic activity, [id number]- Neuromuscular re-education, 97535- Self Care, 29528- Manual therapy, [id number]- Gait training, [phone]- Aquatic Therapy, 97014- Electrical stimulation (unattended), [phone]- Ultrasound, Patient/Family education, Balance training, Stair training, Taping, Dry Needling, Joint mobilization, Scar mobilization, Cryotherapy, and Moist heat  PLAN FOR NEXT SESSION:  Cont per Dr. Serena Croissant gluteus medius repair.  Be aware of healing  ulcer in distal L LE at post heel/ankle.    Riki Altes, PTA  05/17/23  1:20 PM

## 2023-05-18 ENCOUNTER — Ambulatory Visit (HOSPITAL_COMMUNITY): Payer: Medicare Other

## 2023-05-18 NOTE — Telephone Encounter (Signed)
Patient need ov for evaluation  Patient stated will call back for appt

## 2023-05-19 HISTORY — PX: HIP ARTHROPLASTY: SHX981

## 2023-05-20 ENCOUNTER — Ambulatory Visit (HOSPITAL_BASED_OUTPATIENT_CLINIC_OR_DEPARTMENT_OTHER): Payer: Medicare Other | Attending: Orthopaedic Surgery | Admitting: Physical Therapy

## 2023-05-20 ENCOUNTER — Encounter (HOSPITAL_BASED_OUTPATIENT_CLINIC_OR_DEPARTMENT_OTHER): Payer: Self-pay | Admitting: Physical Therapy

## 2023-05-20 DIAGNOSIS — M6281 Muscle weakness (generalized): Secondary | ICD-10-CM | POA: Diagnosis not present

## 2023-05-20 DIAGNOSIS — M25552 Pain in left hip: Secondary | ICD-10-CM | POA: Insufficient documentation

## 2023-05-20 DIAGNOSIS — M25652 Stiffness of left hip, not elsewhere classified: Secondary | ICD-10-CM | POA: Insufficient documentation

## 2023-05-20 DIAGNOSIS — R262 Difficulty in walking, not elsewhere classified: Secondary | ICD-10-CM | POA: Insufficient documentation

## 2023-05-20 NOTE — Therapy (Signed)
 OUTPATIENT PHYSICAL THERAPY TREATMENT   Patient Name: Ellen [name] MRN: [id number] DOB:1940/10/25, 83 y.o., female Today's Date: 05/20/2023  END OF SESSION:  PT End of Session - 05/20/23 0912     Visit Number 7    Number of Visits 25    Date for PT Re-Evaluation 07/07/23    Authorization Type MCR A & B    Progress Note Due on Visit 10    PT Start Time 0850    PT Stop Time 0930    PT Time Calculation (min) 40 min    Activity Tolerance Patient tolerated treatment well    Behavior During Therapy [facility] for tasks assessed/performed                  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   Left Buccal Cheek (Keratoacanthoma)   Umbilical hernia    Past Surgical History:  Procedure Laterality Date   BOWEL RESECTION  07/09/2013   Procedure: SMALL BOWEL RESECTION;  Surgeon: Krystal CHRISTELLA Spinner, MD;  Location: [facility];  Service: General;;   RODDIE NEVIN REPAIR Left 03/22/2023   Procedure: LEFT GLUTEUS MEDIUS REPAIR WITH POSSIBLE COLLAGEN PATCH AUGMENTATION;  Surgeon: Genelle Standing, MD;  Location: [facility];  Service: Orthopedics;  Laterality: Left;   LAPAROTOMY N/A 07/09/2013   Procedure: EXPLORATORY LAPAROTOMY;  Surgeon: Krystal CHRISTELLA Spinner, MD;  Location: [facility];  Service: General;  Laterality: N/A;   SHOULDER ARTHROSCOPY WITH CAPSULORRHAPHY     Patient Active Problem List   Diagnosis Date Noted   Skin ulcer of ankle, limited to breakdown of skin (HCC) 04/28/2023   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   Hypokalemia 12/27/2022   Volume depletion 12/24/2022   Constipation 12/24/2022   Partial small bowel obstruction (HCC) 12/24/2022   Polycythemia 12/24/2022   Gastroesophageal reflux disease 08/26/2022   Bloating 08/26/2022   Small intestinal bacterial overgrowth  (SIBO) 08/26/2022   Osteoarthritis 12/01/2021   Plantar wart 12/01/2021   Ventral hernia 01/01/2021   Diverticulosis 01/05/2019   Dermatitis 01/05/2019   Systolic murmur 07/25/2018   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   Hyperglycemia 10/05/2017   Leg edema 10/05/2017   Hammer toe 10/05/2017   Hot flashes due to menopause 10/05/2017   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/10/2013 07/11/2013   Hypertension 07/09/2013    REFERRING PROVIDER: Genelle Standing, MD  REFERRING DIAG: [phone] (ICD-10-CM) - Tendinopathy of gluteus medius  S/p glute med repair  THERAPY DIAG:  Pain in left hip  Muscle weakness (generalized)  Stiffness of left hip, not elsewhere classified  Difficulty in walking, not elsewhere classified  Rationale for Evaluation and Treatment: Rehabilitation  ONSET DATE: DOS 03/22/2023  Days since surgery: 59   SUBJECTIVE:   SUBJECTIVE STATEMENT: Pt is 8 weeks and 3 days s/p Left hip gluteus medius repair and trochanteric bursectomy.  Pt tries not to lay on her L side, but rolls onto side in her sleep.  Pt reports having increased pain after prior Rx which lasted until yesterday.  Pt took an Aleve which helped some.  Pt reports her walking is better and she can straighten up more.  Pt has difficulty standing and reaching for an object in an overhead cabinet.  Pt is  limited with ambulation distance.  Pt reports compliance with HEP.    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    PAIN:  NPRS:  2-3/10 current, 8/10 worst.  Worst pain is at the end of the day.  Location: L hip  PRECAUTIONS: Other: per surgical protocol, Ulcer on L heel/post ankle/achilles, lumbar  anterolisthesis, double hernia    WEIGHT BEARING RESTRICTIONS: Yes WBAT  FALLS:  Has patient fallen in last 6 months? No  LIVING ENVIRONMENT: Lives with: lives with their spouse Lives in: home; lives on 1 floor though does have to go to basement. 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    PLOF: Independent  Pt performed a walking program and would walk 1-1.5 hours daily.  Pt was ambulating without AD.    PATIENT GOALS: to walk straight and walk normally, improve strength  NEXT MD VISIT: 05/07/2023  OBJECTIVE:  Note: Objective measures were completed at Evaluation unless otherwise noted.  DIAGNOSTIC FINDINGS: Pt is post op.  PATIENT SURVEYS:  FOTO 55 with a goal of 73 at visit 15  COGNITION: Overall cognitive status: Within functional limits for tasks assessed    OBSERVATION:  Incision on lateral L hip is intact with no signs of infection.  Incision is clean and dry.  Healing ulcer on L posterior heel/ankle/achilles.  Pt removed her band-aid.  PT brought band-aids in to cover ulcer though pt declined band-aids.  She states she doesn't need a band aid and it will be fine returning home.    GAIT: Comments: Pt ambulating with SPC.  She denies pain with ambulation.  Slow gait speed.  Decreased toe off on L and increased stance time on R.    TODAY'S TREATMENT:     05/20/2023 Reviewed current function, response to prior Rx, pain levels, and HEP compliance.   Pt performed: Nustep lvl 3, 5 min UEs/LEs   Supine bridge 2x10  Qped rocking 2x10  LAQ x 10, 1# 2 x 10  Hip flexor submax isometric in seated 5 x10  Hip abduction submax isometric- seated 5 2x10  Stool rotations approx 10 reps Pt received L hip flexion, abd, ER, and IR PROM per pt and tissue tolerance w/n protocol ranges  12/30 Nustep lvl 3, 5  min PPT x20  Supine bridge 2x10 Hip flexor isometric in seated 5 2x10 Hip abduction isometric- seated 5 2x10 R hip PROM STM to hip addcuctors   12/28 Nustep lvl 3, 5 min PPT x20  Supine bridge 2x10 Qped rocking 2x10 LAQ 2x10 Seated lateral flexion QL stretch  Gait around clinic with [facility] for gait analysis.  Pt received R hip PROM in flexion and abduction in supine per pt and tissue tolerance w/n protocol ranges   12/18 Supine bridge 2x10 Qped rocking 2x10 LAQ 2x10 Seated hip flex submax isometric 2x5 reps with 5 sec hold Hooklying hip add isometric with 5 sec hold x 10 reps  Pt received R hip PROM in flexion and abduction in supine per pt and tissue tolerance w/n protocol ranges  12/14 STM to quads, adductors, hip flexors PROM flexion, abduction  Exercises - Seated Heel Squeeze  - 1 x daily - 7 x weekly - 10 reps - 10 second hold - Seated Long Arc Quad  - 1 x daily - 7 x weekly - 10 reps - 5 second hold - Supine Bridge  - 1 x daily - 7 x weekly - 2 sets - 10 reps - Supine Bridge with Mini Swiss Ball Between Knees  - 1 x daily - 7 x weekly - 2 sets - 10 reps - Supine Single Knee to Chest Stretch  - 2 x daily - 7 x weekly - 1 sets - 3 reps - 30 hold - Sidelying Quadriceps Stretch with Strap  - 2 x daily - 7 x weekly - 1 sets - 3 reps - 30 hold - Supine Piriformis Stretch with Foot on Ground  - 2 x daily - 7 x weekly - 1 sets - 3 reps - 30 hold                                                                                                                            04/23/23 STM to quads, adductors, hip flexors PROM flexion, abduction  Glute set 10 x 10 second holdssecond holds Hip adduction isometrics 10 x 10 second holds Prone hip ER isometric 10 x 10 second holds LAQ 10 x 5 second holds Quadruped rocking 2 x 10      PATIENT EDUCATION:  Education details: dx, relevant anatomy, HEP, POC, exercise form, rationale of interventions, and post op and protocol  restrictions/limitations.  Person educated: Patient Education method: Explanation, Demonstration, Tactile cues, Verbal cues, and Handouts Education comprehension: verbalized understanding, returned demonstration, verbal cues required, tactile cues required, and needs further education  HOME EXERCISE PROGRAM: Access Code: [email or web address] URL: [email or web address] Date: 04/14/2023 Prepared by: Mose Minerva  Exercises - Supine Quadricep Sets  - 2 x daily - 7 x weekly - 2 sets - 10 reps - 5 seconds hold - Supine Posterior Pelvic Tilt  - 2 x daily - 7 x weekly - 2 sets - 10 reps  04/23/23- Hooklying Gluteal Sets  - 1 x daily - 7 x weekly - 5 reps - 10 second hold - Supine Hip Adduction Isometric with Ball  - 1 x daily - 7 x weekly - 10 reps - 10 second hold - Seated Heel Squeeze  - 1 x daily - 7 x weekly - 10 reps - 10 second hold - Seated Long Arc Quad  - 1 x daily - 7 x weekly - 10 reps - 5 second hold  ASSESSMENT:  CLINICAL IMPRESSION: Pt is improving with mobility and reports her walking is better.  PT performed hip PROM per protocol and she tolerated PROM well.  Pt performed exercises per protocol well with cuing and instruction in correct form.  Pt performed seated hip abd isometrics with manual resistance without pain.  She had some pain with stool rotations and PT had pt decrease ROM.  Pt responded well to Rx stating she felt a little more tender after Rx though had no c/o's.  Pt should benefit from cont skilled PT per protocol to address impairments and ongoing goals and to assist with restoring desired level of function.    OBJECTIVE IMPAIRMENTS: Abnormal gait, decreased activity tolerance, decreased endurance, decreased mobility, difficulty walking, decreased ROM, decreased strength, hypomobility, and pain.   ACTIVITY LIMITATIONS: carrying, lifting, standing, squatting, stairs, transfers, and locomotion level  PARTICIPATION LIMITATIONS: cleaning and yard work  PERSONAL  FACTORS: 3+ comorbidities: L LE ulcer, double hernia, arthritis  are also affecting patient's functional outcome.   REHAB POTENTIAL: Good  CLINICAL DECISION MAKING: Stable/uncomplicated  EVALUATION COMPLEXITY: Low   GOALS:   SHORT TERM GOALS:  Pt will be independent and compliant with HEP for improved pain, strength, and function. Baseline: Goal status: INITIAL Target date:  05/05/2023   2.  Pt will demo improved quality of gait with increased stance time and toe off on L.   Baseline:  Goal status: INITIAL Target date:  05/12/2023  3.  Pt will progress with exercises per protocol without adverse effects for improved strength and mobility.   Baseline:  Goal status: INITIAL Target date:  05/26/2023   4.  Pt will report improved tolerance with standing activities and ambulation.  Baseline:  Goal status: INITIAL Target date:  06/16/2023   5.  Pt will ambulate with a normalized heel to toe gait without limping.  Baseline:  Goal status: INITIAL Target date:  06/23/2023   6.  Pt will be able to perform a 6 inch step up with good form and control with L LE leading for improved performance of stairs abd and improved functional strength. Baseline:  Goal status: INITIAL Target date:  06/30/2023   LONG TERM GOALS: Target date: 07/21/2023   Pt will be able to ambulate extended community distance without significant difficulty and pain.   Baseline:  Goal status: INITIAL  2.  Pt will be able to perform her ADLs and IADLs including household chores without significant difficulty and pain. Baseline:  Goal status: INITIAL  3.  Pt will be able to take care of her husband without significant limitations.   Baseline:  Goal status: INITIAL  4.  Pt will be able to perform mini squats with good form and without increased pain in order for improved functional LE strength and to assist with returning to gardening activities.   Baseline:  Goal status: INITIAL     PLAN:  PT  FREQUENCY: 1x/week x 2-3 weeks and 2x/wk afterwards  PT DURATION: 12-14 weeks  PLANNED INTERVENTIONS: 97164- PT Re-evaluation, 97110-Therapeutic exercises, 97530- Therapeutic activity, [id number]- Neuromuscular re-education, 97535- Self Care, [id number]- Manual therapy, [id number]- Gait training, [phone]- Aquatic Therapy, 97014- Electrical stimulation (unattended), [phone]- Ultrasound, Patient/Family education, Balance training, Stair training, Taping, Dry Needling, Joint mobilization, Scar mobilization, Cryotherapy, and Moist heat  PLAN FOR NEXT SESSION:  Cont per Dr. Danetta gluteus medius repair.  Be aware of healing ulcer in distal L LE at post heel/ankle.    Leigh Minerva III PT, DPT 05/20/23 3:06 PM

## 2023-05-24 ENCOUNTER — Encounter (HOSPITAL_BASED_OUTPATIENT_CLINIC_OR_DEPARTMENT_OTHER): Payer: Medicare Other | Admitting: Physical Therapy

## 2023-05-26 ENCOUNTER — Ambulatory Visit: Payer: Medicare Other | Admitting: Family Medicine

## 2023-05-26 ENCOUNTER — Encounter (HOSPITAL_BASED_OUTPATIENT_CLINIC_OR_DEPARTMENT_OTHER): Payer: Medicare Other

## 2023-05-26 ENCOUNTER — Encounter: Payer: Self-pay | Admitting: Family Medicine

## 2023-05-26 VITALS — BP 138/80 | HR 68 | Temp 97.7°F | Ht 61.0 in | Wt 110.8 lb

## 2023-05-26 DIAGNOSIS — R600 Localized edema: Secondary | ICD-10-CM | POA: Diagnosis not present

## 2023-05-26 DIAGNOSIS — I10 Essential (primary) hypertension: Secondary | ICD-10-CM

## 2023-05-26 DIAGNOSIS — L0390 Cellulitis, unspecified: Secondary | ICD-10-CM

## 2023-05-26 DIAGNOSIS — L97301 Non-pressure chronic ulcer of unspecified ankle limited to breakdown of skin: Secondary | ICD-10-CM | POA: Diagnosis not present

## 2023-05-26 MED ORDER — TORSEMIDE 40 MG PO TABS: 40.0000 mg | ORAL_TABLET | Freq: Every day | ORAL | 5 refills | Status: DC

## 2023-05-26 MED ORDER — CEPHALEXIN 500 MG PO CAPS: 500.0000 mg | ORAL_CAPSULE | Freq: Four times a day (QID) | ORAL | 0 refills | Status: AC

## 2023-05-26 NOTE — Patient Instructions (Signed)
 It was very nice to see you today!  Please stop the furosemide  and start the torsemide .  Please start the Keflex .  We need to get the fluid out of your legs.  We may need to send you to wound care if not improving.  Please follow-up with us  in a couple of weeks.  Return in about 2 weeks (around 06/09/2023).   Take care, Dr Kennyth  PLEASE NOTE:  If you had any lab tests, please let us  know if you have not heard back within a few days. You may see your results on mychart before we have a chance to review them but we will give you a call once they are reviewed by us .   If we ordered any referrals today, please let us  know if you have not heard from their office within the next week.   If you had any urgent prescriptions sent in today, please check with the pharmacy within an hour of our visit to make sure the prescription was transmitted appropriately.   Please try these tips to maintain a healthy lifestyle:  Eat at least 3 REAL meals and 1-2 snacks per day.  Aim for no more than 5 hours between eating.  If you eat breakfast, please do so within one hour of getting up.   Each meal should contain half fruits/vegetables, one quarter protein, and one quarter carbs (no bigger than a computer mouse)  Cut down on sweet beverages. This includes juice, soda, and sweet tea.   Drink at least 1 glass of water with each meal and aim for at least 8 glasses per day  Exercise at least 150 minutes every week.

## 2023-05-26 NOTE — Assessment & Plan Note (Signed)
 Patient with bilateral lower extremity venous stasis ulcers.  Her ABIs were normal last month.  We discussed importance of managing her lower extremity edema to allow the ulcers to fully heal.  We did discuss having her go back to see Dr. Due to with orthopedics however she does not think this would be beneficial at this point.  We also discussed referral to wound care for Unna boots however she would like to hold off on this for now as well.  We will switch her Lasix  to torsemide  40 mg daily which will hopefully be more effective at reducing her edema.  She will continue with conservative management otherwise including elevation, compression, and salt once which she has already been doing.  She will follow-up with us  in 1 to 2 weeks.  If edema is not resolving will need referral to wound care for consideration for Unna boots.

## 2023-05-26 NOTE — Assessment & Plan Note (Signed)
 Initially elevated however at goal on recheck on losartan 50 mg daily and HCTZ 12.5 mg daily.

## 2023-05-26 NOTE — Assessment & Plan Note (Signed)
 Likely secondary to venous insufficiency.  Will be switching her Lasix  to torsemide  40 mg daily.  She will continue with conservative management as well.  Recommended daily potassium supplementation.  Recheck in 1 to 2 weeks.  May need referral to wound care for Unna boots as above if still has significant edema and ulceration.  We discussed reasons to return to care.

## 2023-05-26 NOTE — Progress Notes (Signed)
 Ellen [name] is a 83 y.o. female who presents today for an office visit.  Assessment/Plan:  New/Acute Problems: Cellulitis  No red flags.  No signs of systemic infection or illness.  She did not have a full resolution of her cellulitis with doxycycline .  Will send in Keflex  500 mg 4 times daily for 7 days.  We did discuss importance of reducing her edema to allow her stasis ulcers to heal as below.  She will follow-up with us  in 1 to 2 weeks.  Chronic Problems Addressed Today: Skin ulcer of ankle, limited to breakdown of skin [facility]) Patient with bilateral lower extremity venous stasis ulcers.  Her ABIs were normal last month.  We discussed importance of managing her lower extremity edema to allow the ulcers to fully heal.  We did discuss having her go back to see Dr. Due to with orthopedics however she does not think this would be beneficial at this point.  We also discussed referral to wound care for Unna boots however she would like to hold off on this for now as well.  We will switch her Lasix  to torsemide  40 mg daily which will hopefully be more effective at reducing her edema.  She will continue with conservative management otherwise including elevation, compression, and salt once which she has already been doing.  She will follow-up with us  in 1 to 2 weeks.  If edema is not resolving will need referral to wound care for consideration for Unna boots.  Hypertension Initially elevated however at goal on recheck on losartan  50 mg daily and HCTZ 12.5 mg daily.  Leg edema Likely secondary to venous insufficiency.  Will be switching her Lasix  to torsemide  40 mg daily.  She will continue with conservative management as well.  Recommended daily potassium supplementation.  Recheck in 1 to 2 weeks.  May need referral to wound care for Unna boots as above if still has significant edema and ulceration.  We discussed reasons to return to care.     Subjective:  HPI:  See A/P for status of  chronic conditions.  Patient is here today for follow-up.  I saw her about a month ago for cellulitis secondary to venous stasis ulcer.  At her last visit we started doxycycline  for this.  Also check ABIs to further assess arterial flow which were normal.  Since our last visit she has had persistent ulceration to posterior ankles bilaterally. Still having a lot swelling to her calf bilaterally as well.  She has been consistent with Lasix .  She does think that this is helping her with urination but is not sure how effective it has been.  She is having a lot of burning and stinging into her calves bilaterally as well.  Seems to be worse in the morning.  She has not any fevers or chills.  She has been keeping the area bandaged with Neosporin.       Objective:  Physical Exam: BP 138/80   Pulse 68   Temp 97.7 F (36.5 C) (Temporal)   Ht 5' 1 (1.549 m)   Wt 110 lb 12.8 oz (50.3 kg)   SpO2 98%   BMI 20.94 kg/m   Gen: No acute distress, resting comfortably Skin: Approximately 3-4 cm ulcer on posterior aspect of bilateral ankles near calcaneus.  Surrounding warmth and erythema on bilateral lower extremities. MUSCULOSKELETAL: 2+ pitting edema to knees bilaterally. Neuro: Grossly normal, moves all extremities Psych: Normal affect and thought content      [address]  EMERSON Kitty, MD 05/26/2023 9:05 AM

## 2023-05-27 ENCOUNTER — Encounter (HOSPITAL_BASED_OUTPATIENT_CLINIC_OR_DEPARTMENT_OTHER): Payer: Medicare Other | Admitting: Physical Therapy

## 2023-05-30 NOTE — Therapy (Signed)
 OUTPATIENT PHYSICAL THERAPY TREATMENT   Patient Name: Ellen [name] MRN: [id number] DOB:1940/08/02, 83 y.o., female Today's Date: 05/31/2023  END OF SESSION:  PT End of Session - 05/31/23 1155     Visit Number 8    Number of Visits 25    Date for PT Re-Evaluation 07/07/23    Authorization Type MCR A & B    PT Start Time 1108    PT Stop Time 1151    PT Time Calculation (min) 43 min    Activity Tolerance Patient tolerated treatment well    Behavior During Therapy WFL for tasks assessed/performed                   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   Left Buccal Cheek (Keratoacanthoma)   Umbilical hernia    Past Surgical History:  Procedure Laterality Date   BOWEL RESECTION  07/09/2013   Procedure: SMALL BOWEL RESECTION;  Surgeon: Krystal CHRISTELLA Spinner, MD;  Location: [facility];  Service: General;;   RODDIE NEVIN REPAIR Left 03/22/2023   Procedure: LEFT GLUTEUS MEDIUS REPAIR WITH POSSIBLE COLLAGEN PATCH AUGMENTATION;  Surgeon: Genelle Standing, MD;  Location: [facility];  Service: Orthopedics;  Laterality: Left;   LAPAROTOMY N/A 07/09/2013   Procedure: EXPLORATORY LAPAROTOMY;  Surgeon: Krystal CHRISTELLA Spinner, MD;  Location: [facility];  Service: General;  Laterality: N/A;   SHOULDER ARTHROSCOPY WITH CAPSULORRHAPHY     Patient Active Problem List   Diagnosis Date Noted   Skin ulcer of ankle, limited to breakdown of skin (HCC) 04/28/2023   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   Hypokalemia 12/27/2022   Volume depletion 12/24/2022   Constipation 12/24/2022   Partial small bowel obstruction (HCC) 12/24/2022   Polycythemia 12/24/2022   Gastroesophageal reflux disease 08/26/2022   Bloating 08/26/2022   Small intestinal bacterial overgrowth (SIBO) 08/26/2022    Osteoarthritis 12/01/2021   Plantar wart 12/01/2021   Ventral hernia 01/01/2021   Diverticulosis 01/05/2019   Dermatitis 01/05/2019   Systolic murmur 07/25/2018   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   Hyperglycemia 10/05/2017   Leg edema 10/05/2017   Hammer toe 10/05/2017   Hot flashes due to menopause 10/05/2017   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/10/2013 07/11/2013   Hypertension 07/09/2013    REFERRING PROVIDER: Genelle Standing, MD  REFERRING DIAG: [phone] (ICD-10-CM) - Tendinopathy of gluteus medius  S/p glute med repair  THERAPY DIAG:  Pain in left hip  Muscle weakness (generalized)  Stiffness of left hip, not elsewhere classified  Difficulty in walking, not elsewhere classified  Rationale for Evaluation and Treatment: Rehabilitation  ONSET DATE: DOS 03/22/2023  Days since surgery: 70   SUBJECTIVE:   SUBJECTIVE STATEMENT: Pt is 10 weeks s/p Left hip gluteus medius repair and trochanteric bursectomy.  I was sore after last time, but not bad.  Pt states she feels a little stronger and is walking straighter.  Pt reports increased ambulation distance.  Pt has difficulty with bending and moves slow with bending. Pt reports compliance with HEP.  Pt has difficulty standing and reaching for an object in an overhead cabinet.  Pt states she does stairs multiple times per day to get to her basement.  She descends stairs bwds and  ascends fwds.  Pt saw MD about L heel ulcer and is on a new antibiotic, Keflex , and diuretic.     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    PAIN:  NPRS:  0/10 current, 5/10 worst.  Worst pain is at the end of the day.  Location: L  hip  PRECAUTIONS: Other: per surgical protocol, Ulcer on L heel/post ankle/achilles, lumbar anterolisthesis, double hernia    WEIGHT BEARING RESTRICTIONS: Yes WBAT  FALLS:  Has patient fallen in last 6 months? No  LIVING ENVIRONMENT: Lives with: lives with their spouse Lives in: home; lives on 1 floor though does have to go to basement. 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    PLOF: Independent  Pt performed a walking program and would walk 1-1.5 hours daily.  Pt was ambulating without AD.    PATIENT GOALS: to walk straight and walk normally, improve strength  NEXT MD VISIT: 05/07/2023  OBJECTIVE:  Note: Objective measures were completed at Evaluation unless otherwise noted.  DIAGNOSTIC FINDINGS: Pt is post op.  PATIENT SURVEYS:  FOTO 55 with a goal of 73 at visit 15  COGNITION: Overall cognitive status: Within functional limits for tasks assessed    OBSERVATION:  Incision on lateral L hip is intact with no signs of infection.  Incision is clean and dry.  Healing ulcer on L posterior heel/ankle/achilles.  Pt removed her band-aid.  PT brought band-aids in to cover ulcer though pt declined band-aids.  She states she doesn't need a band aid and it will be fine returning home.    GAIT: Comments: Pt ambulating with SPC.  She denies pain with ambulation.  Slow gait speed.  Decreased toe off on L and increased stance time on R.    TODAY'S TREATMENT:     05/31/2023 Reviewed current function, response to prior Rx, pain levels, and HEP compliance.  Nustep lvl 3, 5 min UEs/LEs  Supine bridge 2x10 Hip abduction submax isometric- seated 5 2x10 Standing hip abd 2x10 L LE only Stool rotations 2 x 10 reps LAQ 1# x10, 2# 2x10 Standing marching with UE support 2x10  Pt received L hip flexion, abd, ER, and IR  PROM per pt and tissue tolerance w/n protocol ranges   05/20/2023 Reviewed current function, response to prior Rx, pain levels, and HEP compliance.   Pt performed: Nustep lvl 3, 5 min UEs/LEs   Supine bridge 2x10  Qped rocking 2x10  LAQ x 10, 1# 2 x 10  Hip flexor submax isometric in seated 5 x10  Hip abduction submax isometric- seated 5 2x10  Stool rotations approx 10 reps Pt received L hip flexion, abd, ER, and IR PROM per pt and tissue tolerance w/n protocol ranges  12/30 Nustep lvl 3, 5 min PPT x20  Supine bridge 2x10 Hip flexor isometric in seated 5 2x10 Hip abduction isometric- seated 5 2x10 R hip PROM STM to hip addcuctors   12/28 Nustep lvl 3, 5 min PPT x20  Supine bridge 2x10 Qped rocking 2x10 LAQ 2x10 Seated lateral flexion QL stretch  Gait around clinic with [facility] for  gait analysis.  Pt received R hip PROM in flexion and abduction in supine per pt and tissue tolerance w/n protocol ranges   12/18 Supine bridge 2x10 Qped rocking 2x10 LAQ 2x10 Seated hip flex submax isometric 2x5 reps with 5 sec hold Hooklying hip add isometric with 5 sec hold x 10 reps  Pt received R hip PROM in flexion and abduction in supine per pt and tissue tolerance w/n protocol ranges  12/14 STM to quads, adductors, hip flexors PROM flexion, abduction   Exercises - Seated Heel Squeeze  - 1 x daily - 7 x weekly - 10 reps - 10 second hold - Seated Long Arc Quad  - 1 x daily - 7 x weekly - 10 reps - 5 second hold - Supine Bridge  - 1 x daily - 7 x weekly - 2 sets - 10 reps - Supine Bridge with Mini Swiss Ball Between Knees  - 1 x daily - 7 x weekly - 2 sets - 10 reps - Supine Single Knee to Chest Stretch  - 2 x daily - 7 x weekly - 1 sets - 3 reps - 30 hold - Sidelying Quadriceps Stretch with Strap  - 2 x daily - 7 x weekly - 1 sets - 3 reps - 30 hold - Supine Piriformis Stretch with Foot on Ground  - 2 x daily - 7 x weekly - 1 sets - 3 reps - 30 hold                                                                                                                                PATIENT EDUCATION:  Education details: dx, relevant anatomy, HEP, POC, exercise form, rationale of interventions, and post op and protocol restrictions/limitations.  Person educated: Patient Education method: Explanation, Demonstration, Tactile cues, Verbal cues, and Handouts Education comprehension: verbalized understanding, returned demonstration, verbal cues required, tactile cues required, and needs further education  HOME EXERCISE PROGRAM: Access Code: [email or web address] URL: [email or web address] Date: 04/14/2023 Prepared by: Mose Minerva  Exercises - Supine Quadricep Sets  - 2 x daily - 7 x weekly - 2 sets - 10 reps - 5 seconds hold - Supine Posterior Pelvic Tilt  - 2 x daily - 7 x weekly - 2 sets - 10 reps  04/23/23- Hooklying Gluteal Sets  - 1 x daily - 7 x weekly - 5 reps - 10 second hold - Supine Hip Adduction Isometric with Ball  - 1 x daily - 7 x weekly - 10 reps - 10 second hold - Seated Heel Squeeze  - 1 x daily - 7 x weekly - 10 reps - 10 second hold - Seated Long Arc Quad  - 1 x daily - 7 x weekly - 10 reps - 5 second hold  ASSESSMENT:  CLINICAL IMPRESSION: Pt reports she is walking straighter and has increased her ambulation distance.  Her worst pain has improved from 8/10 initially to 5/10 currently.  Pt is progressing with protocol and performed exercises per protocol well with cuing and instruction in correct form and positioning.  Pt had no pain with stool rotations today.  She tolerated exercises and PROM well.  Pt responded well to Rx stating she felt a little tender after Rx though had no increased pain.  Pt should benefit from cont skilled PT per protocol to address impairments and ongoing goals and to assist with restoring desired level of function.    OBJECTIVE IMPAIRMENTS: Abnormal gait, decreased activity tolerance, decreased endurance, decreased mobility,  difficulty walking, decreased ROM, decreased strength, hypomobility, and pain.   ACTIVITY LIMITATIONS: carrying, lifting, standing, squatting, stairs, transfers, and locomotion level  PARTICIPATION LIMITATIONS: cleaning and yard work  PERSONAL FACTORS: 3+ comorbidities: L LE ulcer, double hernia, arthritis  are also affecting patient's functional outcome.   REHAB POTENTIAL: Good  CLINICAL DECISION MAKING: Stable/uncomplicated  EVALUATION COMPLEXITY: Low   GOALS:   SHORT TERM GOALS:  Pt will be independent and compliant with HEP for improved pain, strength, and function. Baseline: Goal status: INITIAL Target date:  05/05/2023   2.  Pt will demo improved quality of gait with increased stance time and toe off on L.   Baseline:  Goal status: INITIAL Target date:  05/12/2023  3.  Pt will progress with exercises per protocol without adverse effects for improved strength and mobility.   Baseline:  Goal status: INITIAL Target date:  05/26/2023   4.  Pt will report improved tolerance with standing activities and ambulation.  Baseline:  Goal status: INITIAL Target date:  06/16/2023   5.  Pt will ambulate with a normalized heel to toe gait without limping.  Baseline:  Goal status: INITIAL Target date:  06/23/2023   6.  Pt will be able to perform a 6 inch step up with good form and control with L LE leading for improved performance of stairs abd and improved functional strength. Baseline:  Goal status: INITIAL Target date:  06/30/2023   LONG TERM GOALS: Target date: 07/21/2023   Pt will be able to ambulate extended community distance without significant difficulty and pain.   Baseline:  Goal status: INITIAL  2.  Pt will be able to perform her ADLs and IADLs including household chores without significant difficulty and pain. Baseline:  Goal status: INITIAL  3.  Pt will be able to take care of her husband without significant limitations.   Baseline:  Goal status:  INITIAL  4.  Pt will be able to perform mini squats with good form and without increased pain in order for improved functional LE strength and to assist with returning to gardening activities.   Baseline:  Goal status: INITIAL     PLAN:  PT FREQUENCY: 1x/week x 2-3 weeks and 2x/wk afterwards  PT DURATION: 12-14 weeks  PLANNED INTERVENTIONS: 97164- PT Re-evaluation, 97110-Therapeutic exercises, 97530- Therapeutic activity, [id number]- Neuromuscular re-education, 97535- Self Care, [id number]- Manual therapy, [id number]- Gait training, [phone]- Aquatic Therapy, 97014- Electrical stimulation (unattended), [phone]- Ultrasound, Patient/Family education, Balance training, Stair training, Taping, Dry Needling, Joint mobilization, Scar mobilization, Cryotherapy, and Moist heat  PLAN FOR NEXT SESSION:  Cont per Dr. Danetta gluteus medius repair.  Be aware of healing ulcer in distal L LE at post heel/ankle.    Leigh Minerva III PT, DPT 05/31/23 4:20 PM

## 2023-05-31 ENCOUNTER — Encounter (HOSPITAL_BASED_OUTPATIENT_CLINIC_OR_DEPARTMENT_OTHER): Payer: Self-pay | Admitting: Physical Therapy

## 2023-05-31 ENCOUNTER — Ambulatory Visit (HOSPITAL_BASED_OUTPATIENT_CLINIC_OR_DEPARTMENT_OTHER): Payer: Medicare Other | Admitting: Physical Therapy

## 2023-05-31 DIAGNOSIS — M25652 Stiffness of left hip, not elsewhere classified: Secondary | ICD-10-CM

## 2023-05-31 DIAGNOSIS — R262 Difficulty in walking, not elsewhere classified: Secondary | ICD-10-CM | POA: Diagnosis not present

## 2023-05-31 DIAGNOSIS — M6281 Muscle weakness (generalized): Secondary | ICD-10-CM | POA: Diagnosis not present

## 2023-05-31 DIAGNOSIS — M25552 Pain in left hip: Secondary | ICD-10-CM | POA: Diagnosis not present

## 2023-06-03 ENCOUNTER — Ambulatory Visit (HOSPITAL_BASED_OUTPATIENT_CLINIC_OR_DEPARTMENT_OTHER): Payer: Medicare Other | Admitting: Physical Therapy

## 2023-06-03 ENCOUNTER — Encounter (HOSPITAL_BASED_OUTPATIENT_CLINIC_OR_DEPARTMENT_OTHER): Payer: Self-pay | Admitting: Physical Therapy

## 2023-06-03 DIAGNOSIS — R262 Difficulty in walking, not elsewhere classified: Secondary | ICD-10-CM | POA: Diagnosis not present

## 2023-06-03 DIAGNOSIS — M6281 Muscle weakness (generalized): Secondary | ICD-10-CM

## 2023-06-03 DIAGNOSIS — M25552 Pain in left hip: Secondary | ICD-10-CM | POA: Diagnosis not present

## 2023-06-03 DIAGNOSIS — M25652 Stiffness of left hip, not elsewhere classified: Secondary | ICD-10-CM

## 2023-06-03 NOTE — Therapy (Signed)
OUTPATIENT PHYSICAL THERAPY TREATMENT   Patient Name: Ellen [name] MRN: [id number] DOB:Sep 04, 1940, 83 y.o., female Today's Date: 06/03/2023  END OF SESSION:  PT End of Session - 06/03/23 1131     Visit Number 9    Date for PT Re-Evaluation 07/07/23    Authorization Type MCR A & B    PT Start Time 1105    PT Stop Time 1153    PT Time Calculation (min) 48 min    Activity Tolerance Patient tolerated treatment well    Behavior During Therapy WFL for tasks assessed/performed                    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   Left Buccal Cheek (Keratoacanthoma)   Umbilical hernia    Past Surgical History:  Procedure Laterality Date   BOWEL RESECTION  07/09/2013   Procedure: SMALL BOWEL RESECTION;  Surgeon: Velora Heckler, MD;  Location: [facility];  Service: General;;   Brock Bad REPAIR Left 03/22/2023   Procedure: LEFT GLUTEUS MEDIUS REPAIR WITH POSSIBLE COLLAGEN PATCH AUGMENTATION;  Surgeon: Huel Cote, MD;  Location: [facility];  Service: Orthopedics;  Laterality: Left;   LAPAROTOMY N/A 07/09/2013   Procedure: EXPLORATORY LAPAROTOMY;  Surgeon: Velora Heckler, MD;  Location: [facility];  Service: General;  Laterality: N/A;   SHOULDER ARTHROSCOPY WITH CAPSULORRHAPHY     Patient Active Problem List   Diagnosis Date Noted   Skin ulcer of ankle, limited to breakdown of skin (HCC) 04/28/2023   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   Hypokalemia 12/27/2022   Volume depletion 12/24/2022   Constipation 12/24/2022   Partial small bowel obstruction (HCC) 12/24/2022   Polycythemia 12/24/2022   Gastroesophageal reflux disease 08/26/2022   Bloating 08/26/2022   Small intestinal bacterial overgrowth (SIBO) 08/26/2022   Osteoarthritis 12/01/2021    Plantar wart 12/01/2021   Ventral hernia 01/01/2021   Diverticulosis 01/05/2019   Dermatitis 01/05/2019   Systolic murmur 07/25/2018   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   Hyperglycemia 10/05/2017   Leg edema 10/05/2017   Hammer toe 10/05/2017   Hot flashes due to menopause 10/05/2017   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/10/2013 07/11/2013   Hypertension 07/09/2013    REFERRING PROVIDER: Huel Cote, MD  REFERRING DIAG: [phone] (ICD-10-CM) - Tendinopathy of gluteus medius  S/p glute med repair  THERAPY DIAG:  Pain in left hip  Muscle weakness (generalized)  Stiffness of left hip, not elsewhere classified  Difficulty in walking, not elsewhere classified  Rationale for Evaluation and Treatment: Rehabilitation  ONSET DATE: DOS 03/22/2023  Days since surgery: 73   SUBJECTIVE:   SUBJECTIVE STATEMENT: Pt is 10 weeks and 3 days s/p Left hip gluteus medius repair and trochanteric bursectomy.  Pt denies any adverse effects after prior Rx.  Pt states her hip is a little tender, though not bad.     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    PAIN:  NPRS:  2/10 current,  5/10 worst.  Worst pain is at the end of the day.  Location: L hip  PRECAUTIONS: Other: per surgical protocol, Ulcer on L heel/post ankle/achilles, lumbar anterolisthesis, double hernia    WEIGHT BEARING RESTRICTIONS: Yes WBAT  FALLS:  Has patient fallen in last 6 months? No  LIVING ENVIRONMENT: Lives with: lives with their spouse Lives in: home; lives on 1 floor though does have to go to basement. 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    PLOF: Independent  Pt performed a walking program and would walk 1-1.5 hours daily.  Pt was ambulating without AD.    PATIENT GOALS: to walk straight and walk normally, improve strength  NEXT MD VISIT: 05/07/2023  OBJECTIVE:  Note: Objective measures were completed at Evaluation unless otherwise noted.  DIAGNOSTIC FINDINGS: Pt is post op.   TODAY'S TREATMENT:     06/03/2023 Nustep lvl 3, 5 min UEs/LEs  Cybex leg press  30# x10, 35# x10 Standing marching x10 reps, x5 reps with UE support Standing hip abd 2x10 L LE only Stool rotations 2 x 10 reps Supine bridge 2x10 LAQ 2# 3x10 Weight shifts on airex s/s without UE support x 10 reps and f/b with UE support x 10 reps Staggered stance on airex with L LE back x 30 sec  Pt received L hip flexion, ER, and IR PROM per pt and tissue tolerance w/n protocol ranges  05/31/2023 Reviewed current function, response to prior Rx, pain levels, and HEP compliance.  Nustep lvl 3, 5 min UEs/LEs  Supine bridge 2x10 Hip abduction submax isometric- seated 5" 2x10 Standing hip abd 2x10 L LE only Stool rotations 2 x 10 reps LAQ 1# x10, 2# 2x10 Standing marching with UE support 2x10  Pt received L hip flexion, abd, ER, and IR PROM per pt and tissue tolerance w/n protocol ranges   05/20/2023 Reviewed current function, response to prior Rx, pain levels, and HEP compliance.   Pt performed: Nustep lvl 3, 5 min UEs/LEs   Supine bridge 2x10  Qped rocking 2x10  LAQ x 10, 1# 2 x 10  Hip flexor submax isometric in seated 5" x10  Hip abduction submax isometric- seated 5" 2x10  Stool rotations approx 10 reps Pt received L hip flexion, abd, ER, and IR PROM per pt and tissue tolerance w/n protocol ranges  12/30 Nustep lvl 3, 5 min PPT x20  Supine bridge 2x10 Hip flexor isometric in seated 5" 2x10 Hip abduction isometric- seated 5" 2x10 R  hip PROM STM to hip addcuctors   12/28 Nustep lvl 3, 5 min PPT x20  Supine bridge 2x10 Qped rocking 2x10 LAQ 2x10 Seated lateral flexion QL stretch  Gait around clinic with [facility] for gait analysis.  Pt received R hip PROM in flexion and abduction in supine per pt and tissue tolerance w/n protocol ranges  PATIENT EDUCATION:  Education details: dx, relevant anatomy, HEP, POC, exercise form, rationale of interventions, and post op and protocol restrictions/limitations.  Person educated: Patient Education method: Explanation, Demonstration, Tactile cues, Verbal cues, and Handouts Education comprehension: verbalized understanding, returned demonstration, verbal cues required, tactile cues required, and needs further education  HOME EXERCISE PROGRAM: Access Code: [email or web address] URL: [email or web address] Date: 04/14/2023 Prepared by: Aaron Edelman  Exercises - Supine Quadricep Sets  - 2 x daily - 7 x weekly - 2 sets - 10 reps - 5 seconds hold - Supine Posterior Pelvic Tilt  - 2 x daily - 7 x weekly - 2 sets - 10 reps  04/23/23- Hooklying Gluteal Sets  - 1 x daily - 7 x weekly - 5 reps - 10 second hold - Supine Hip Adduction Isometric with Ball  - 1 x daily - 7 x weekly - 10 reps - 10 second hold - Seated Heel Squeeze  - 1 x daily - 7 x weekly - 10 reps - 10 second hold - Seated Long Arc Quad  - 1 x daily - 7 x weekly - 10 reps - 5 second hold  ASSESSMENT:  CLINICAL IMPRESSION: PT is slowly progressing exercises per protocol and pt tolerated exercises well.  She had pain with standing hip abduction which improved with decreasing range of motion.  PT performed weight shifts on airex and staggered stance on airex to improve proprioception and Wb'ing through L LE.  Pt performed exercises per protocol well with cuing and instruction in correct form and  positioning.  Pt tolerated hip PROM well.  She responded well to Rx having no increased pain after Rx.  Pt should benefit from cont skilled PT per protocol to address impairments and ongoing goals and to assist with restoring desired level of function.    OBJECTIVE IMPAIRMENTS: Abnormal gait, decreased activity tolerance, decreased endurance, decreased mobility, difficulty walking, decreased ROM, decreased strength, hypomobility, and pain.   ACTIVITY LIMITATIONS: carrying, lifting, standing, squatting, stairs, transfers, and locomotion level  PARTICIPATION LIMITATIONS: cleaning and yard work  PERSONAL FACTORS: 3+ comorbidities: L LE ulcer, double hernia, arthritis  are also affecting patient's functional outcome.   REHAB POTENTIAL: Good  CLINICAL DECISION MAKING: Stable/uncomplicated  EVALUATION COMPLEXITY: Low   GOALS:   SHORT TERM GOALS:  Pt will be independent and compliant with HEP for improved pain, strength, and function. Baseline: Goal status: INITIAL Target date:  05/05/2023   2.  Pt will demo improved quality of gait with increased stance time and toe off on L.   Baseline:  Goal status: INITIAL Target date:  05/12/2023  3.  Pt will progress with exercises per protocol without adverse effects for improved strength and mobility.   Baseline:  Goal status: INITIAL Target date:  05/26/2023   4.  Pt will report improved tolerance with standing activities and ambulation.  Baseline:  Goal status: INITIAL Target date:  06/16/2023   5.  Pt will ambulate with a normalized heel to toe gait without limping.  Baseline:  Goal status: INITIAL Target date:  06/23/2023   6.  Pt will be able to perform a 6 inch step up with good form and control with L LE leading for improved performance of stairs abd and improved functional strength. Baseline:  Goal status: INITIAL Target date:  06/30/2023   LONG TERM GOALS: Target date: 07/21/2023   Pt will be able to ambulate extended  community distance without significant difficulty and pain.   Baseline:  Goal status: INITIAL  2.  Pt will be able to perform her ADLs and IADLs including household chores without significant difficulty and pain. Baseline:  Goal status: INITIAL  3.  Pt will be able to take care of her husband without significant limitations.   Baseline:  Goal status: INITIAL  4.  Pt will be able to perform mini squats with good form and without increased pain in order for improved functional LE strength and to assist with returning to gardening activities.   Baseline:  Goal status: INITIAL     PLAN:  PT FREQUENCY: 1x/week x 2-3 weeks and 2x/wk afterwards  PT DURATION: 12-14 weeks  PLANNED INTERVENTIONS: 97164- PT Re-evaluation, 97110-Therapeutic exercises, 97530- Therapeutic activity, [id number]- Neuromuscular re-education, 97535- Self Care, [id number]- Manual therapy, [id number]- Gait training, [phone]- Aquatic Therapy, 97014- Electrical stimulation (unattended), [phone]- Ultrasound, Patient/Family education, Balance training, Stair training, Taping, Dry Needling, Joint mobilization, Scar mobilization, Cryotherapy, and Moist heat  PLAN FOR NEXT SESSION:  Cont per Dr. Serena Croissant gluteus medius repair.  Be aware of healing ulcer in distal L LE at post heel/ankle.    Audie Clear III PT, DPT 06/03/23 4:57 PM

## 2023-06-09 ENCOUNTER — Ambulatory Visit (HOSPITAL_BASED_OUTPATIENT_CLINIC_OR_DEPARTMENT_OTHER): Payer: Medicare Other | Admitting: Physical Therapy

## 2023-06-09 ENCOUNTER — Encounter (HOSPITAL_BASED_OUTPATIENT_CLINIC_OR_DEPARTMENT_OTHER): Payer: Self-pay | Admitting: Physical Therapy

## 2023-06-09 DIAGNOSIS — R262 Difficulty in walking, not elsewhere classified: Secondary | ICD-10-CM | POA: Diagnosis not present

## 2023-06-09 DIAGNOSIS — M6281 Muscle weakness (generalized): Secondary | ICD-10-CM | POA: Diagnosis not present

## 2023-06-09 DIAGNOSIS — M25652 Stiffness of left hip, not elsewhere classified: Secondary | ICD-10-CM

## 2023-06-09 DIAGNOSIS — M25552 Pain in left hip: Secondary | ICD-10-CM | POA: Diagnosis not present

## 2023-06-09 NOTE — Therapy (Signed)
OUTPATIENT PHYSICAL THERAPY TREATMENT   Patient Name: Jamere Hallowell MRN: [id number] DOB:30-Apr-1941, 83 y.o., female Today's Date: 06/09/2023  Progress Note   Reporting Period 04/13/24 to 06/09/23   See note below for Objective Data and Assessment of Progress/Goals    END OF SESSION:  PT End of Session - 06/09/23 1102     Visit Number 10    Number of Visits 25    Date for PT Re-Evaluation 07/07/23    Authorization Type MCR A & B    Progress Note Due on Visit 20    PT Start Time 1102    PT Stop Time 1145    PT Time Calculation (min) 43 min    Activity Tolerance Patient tolerated treatment well    Behavior During Therapy WFL for tasks assessed/performed                    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   Left Buccal Cheek (Keratoacanthoma)   Umbilical hernia    Past Surgical History:  Procedure Laterality Date   BOWEL RESECTION  07/09/2013   Procedure: SMALL BOWEL RESECTION;  Surgeon: Velora Heckler, MD;  Location: [facility];  Service: General;;   Brock Bad REPAIR Left 03/22/2023   Procedure: LEFT GLUTEUS MEDIUS REPAIR WITH POSSIBLE COLLAGEN PATCH AUGMENTATION;  Surgeon: Huel Cote, MD;  Location: [facility];  Service: Orthopedics;  Laterality: Left;   LAPAROTOMY N/A 07/09/2013   Procedure: EXPLORATORY LAPAROTOMY;  Surgeon: Velora Heckler, MD;  Location: [facility];  Service: General;  Laterality: N/A;   SHOULDER ARTHROSCOPY WITH CAPSULORRHAPHY     Patient Active Problem List   Diagnosis Date Noted   Skin ulcer of ankle, limited to breakdown of skin (HCC) 04/28/2023   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   Hypokalemia 12/27/2022   Volume depletion 12/24/2022   Constipation 12/24/2022   Partial small bowel obstruction (HCC)  12/24/2022   Polycythemia 12/24/2022   Gastroesophageal reflux disease 08/26/2022   Bloating 08/26/2022   Small intestinal bacterial overgrowth (SIBO) 08/26/2022   Osteoarthritis 12/01/2021   Plantar wart 12/01/2021   Ventral hernia 01/01/2021   Diverticulosis 01/05/2019   Dermatitis 01/05/2019   Systolic murmur 07/25/2018   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   Hyperglycemia 10/05/2017   Leg edema 10/05/2017   Hammer toe 10/05/2017   Hot flashes due to menopause 10/05/2017   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/10/2013 07/11/2013   Hypertension 07/09/2013    REFERRING PROVIDER: Huel Cote, MD  REFERRING DIAG: [phone] (ICD-10-CM) - Tendinopathy of gluteus medius  S/p glute med repair  THERAPY DIAG:  Pain in left hip  Muscle weakness (generalized)  Stiffness of left hip, not elsewhere classified  Difficulty in walking, not elsewhere classified  Rationale for Evaluation and Treatment: Rehabilitation  ONSET DATE: DOS 03/22/2023  Days since surgery: 79   SUBJECTIVE:   SUBJECTIVE STATEMENT: Pt states HEP is going well. Patient states she is at about 40% improvement/function. She has good and bad days, it lets her know if she over does things. She remains limited by pain, balance, gait, strength.    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    PAIN:  NPRS:  2/10 current, 5/10 worst.  Worst pain is at the end of the day.  Location: L hip  PRECAUTIONS: Other: per surgical protocol, Ulcer on L heel/post ankle/achilles, lumbar anterolisthesis, double hernia    WEIGHT BEARING RESTRICTIONS: Yes WBAT  FALLS:  Has patient fallen in last 6 months? No  LIVING  ENVIRONMENT: Lives with: lives with their spouse Lives in: home; lives on 1 floor though does have to go to basement. 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  Pt was ambulating without AD.    PATIENT GOALS: to walk straight and walk normally, improve strength  NEXT MD VISIT: 05/07/2023  OBJECTIVE:  Note: Objective measures were completed at Evaluation unless otherwise noted.   DIAGNOSTIC FINDINGS: Pt is post op.   PATIENT SURVEYS:  FOTO 55 with a goal of 73 at visit 15 06/09/23: 49% function   COGNITION: Overall cognitive status: Within functional limits for tasks assessed              OBSERVATION:  Incision on lateral L hip is intact with no signs of infection.  Incision is clean and dry.  Healing ulcer on L posterior heel/ankle/achilles.  Pt removed her band-aid.  PT brought band-aids in to cover ulcer though pt declined band-aids.  She states she doesn't need a band aid and it will be fine returning home.     GAIT: Comments: Pt ambulating with SPC.  She denies pain with ambulation.  Slow gait speed.  Decreased toe off on L and increased stance time on R.     LOWER EXTREMITY MMT:  MMT Right 06/09/2023 Left 06/09/2023  Hip flexion 31.6 36.8  Hip extension    Hip abduction 45.9 40.7  Hip adduction    Hip internal rotation    Hip external rotation    Knee flexion    Knee extension 38.4 31.4  Ankle dorsiflexion    Ankle plantarflexion    Ankle inversion    Ankle eversion     (Blank rows = not tested) *= pain   TODAY'S TREATMENT:    06/09/23 Nustep lvl 3, 5 min UEs/LEs  Reassessment  1/2 kneeling hip flexor stretch 3 x 20 second holds Step up 4 inch 2 x 10  Lateral step up 4 inch 2 x 10 Self STM with roller to L  quads - 3 minutes  Lateral stepping 4 x 8 feet bilateral   06/03/2023 Nustep lvl 3, 5 min UEs/LEs  Cybex leg press  30# x10, 35# x10 Standing marching x10 reps, x5 reps with UE support Standing hip abd 2x10 L LE only Stool rotations 2 x 10 reps Supine bridge 2x10 LAQ 2# 3x10 Weight shifts on airex s/s without UE support x 10 reps and f/b with UE support x 10 reps Staggered stance on airex with L LE back x 30 sec  Pt received L hip flexion, ER, and IR PROM per pt and tissue tolerance w/n protocol ranges  05/31/2023 Reviewed current function, response to prior Rx, pain levels, and HEP compliance.  Nustep lvl 3, 5 min UEs/LEs  Supine bridge 2x10 Hip abduction submax isometric- seated 5" 2x10 Standing hip abd 2x10 L LE only Stool rotations 2 x 10 reps LAQ 1# x10, 2# 2x10 Standing marching with UE support 2x10  Pt received L hip flexion, abd, ER, and IR PROM per pt and tissue tolerance w/n protocol ranges   05/20/2023 Reviewed current function, response to prior Rx, pain levels, and HEP compliance.   Pt performed: Nustep lvl 3, 5 min UEs/LEs   Supine bridge 2x10  Qped rocking 2x10  LAQ x 10, 1# 2 x 10  Hip flexor submax isometric in seated 5" x10  Hip abduction submax isometric- seated 5" 2x10  Stool rotations approx 10 reps Pt received L hip flexion, abd, ER, and IR PROM per pt and tissue tolerance w/n protocol ranges  12/30 Nustep lvl 3, 5 min PPT x20  Supine bridge 2x10 Hip flexor isometric in seated 5" 2x10 Hip abduction isometric- seated 5" 2x10 R hip PROM STM to hip addcuctors   12/28 Nustep lvl 3, 5 min PPT x20  Supine bridge 2x10 Qped rocking 2x10 LAQ 2x10 Seated lateral flexion QL stretch  Gait around clinic with [facility] for gait analysis.  Pt received R hip PROM in flexion and abduction in supine per pt and tissue tolerance w/n protocol ranges                                                                                                                                PATIENT EDUCATION:  Education details: dx, relevant anatomy, HEP, POC, exercise form, rationale of interventions, and post op and protocol restrictions/limitations.  06/09/23: reassessment findings Person educated: Patient Education method: Explanation, Demonstration, Tactile cues, Verbal cues, and Handouts Education comprehension: verbalized understanding, returned demonstration, verbal cues required, tactile cues required, and needs further education  HOME EXERCISE PROGRAM: Access Code: [email or web address] URL: [email or web address] Date: 04/14/2023 Prepared by: Aaron Edelman  Exercises - Supine Quadricep Sets  - 2 x daily - 7 x weekly - 2 sets - 10 reps - 5 seconds hold - Supine Posterior Pelvic Tilt  - 2 x daily - 7 x weekly - 2 sets - 10 reps  04/23/23- Hooklying Gluteal Sets  - 1 x daily - 7 x weekly - 5 reps - 10 second hold - Supine Hip Adduction Isometric with Ball  - 1 x daily - 7 x weekly - 10 reps - 10 second hold - Seated Heel Squeeze  - 1 x daily - 7 x weekly - 10 reps - 10 second hold - Seated Long Arc Quad  - 1 x daily - 7 x weekly - 10 reps - 5 second hold  ASSESSMENT:  CLINICAL IMPRESSION: Patient has met 1/6 short term goals and 0/4 long term goals with ability to complete HEP. Remaining goals not met due to continued deficits in symptoms, strength, ROM, activity tolerance, gait, balance, and functional mobility but she has made  good progress toward remaining goals. Patient with continued symptoms in hip flexor/quad with hyperactive musculature, attempted prone stretch but patient unable to lay prone but is able to perform stretch in 1/2 kneeling. Unable to perform step up on 6 inch step due to pain/weakness, tolerates 4 inch with UE support. Patient will continue to benefit from skilled physical therapy in order to improve function and reduce impairment.     OBJECTIVE IMPAIRMENTS: Abnormal gait, decreased activity tolerance, decreased endurance, decreased  mobility, difficulty walking, decreased ROM, decreased strength, hypomobility, and pain.   ACTIVITY LIMITATIONS: carrying, lifting, standing, squatting, stairs, transfers, and locomotion level  PARTICIPATION LIMITATIONS: cleaning and yard work  PERSONAL FACTORS: 3+ comorbidities: L LE ulcer, double hernia, arthritis  are also affecting patient's functional outcome.   REHAB POTENTIAL: Good  CLINICAL DECISION MAKING: Stable/uncomplicated  EVALUATION COMPLEXITY: Low   GOALS:   SHORT TERM GOALS:  Pt will be independent and compliant with HEP for improved pain, strength, and function. Baseline: Goal status: MET Target date:  05/05/2023   2.  Pt will demo improved quality of gait with increased stance time and toe off on L.   Baseline:  Goal status: INITIAL Target date:  05/12/2023  3.  Pt will progress with exercises per protocol without adverse effects for improved strength and mobility.   Baseline:  Goal status: INITIAL Target date:  05/26/2023   4.  Pt will report improved tolerance with standing activities and ambulation.  Baseline:  Goal status: INITIAL Target date:  06/16/2023   5.  Pt will ambulate with a normalized heel to toe gait without limping.  Baseline:  Goal status: INITIAL Target date:  06/23/2023   6.  Pt will be able to perform a 6 inch step up with good form and control with L LE leading for improved performance of stairs abd and improved functional strength. Baseline:  Goal status: INITIAL Target date:  06/30/2023   LONG TERM GOALS: Target date: 07/21/2023   Pt will be able to ambulate extended community distance without significant difficulty and pain.   Baseline:  Goal status: INITIAL  2.  Pt will be able to perform her ADLs and IADLs including household chores without significant difficulty and pain. Baseline:  Goal status: INITIAL  3.  Pt will be able to take care of her husband without significant limitations.   Baseline:  Goal status:  INITIAL  4.  Pt will be able to perform mini squats with good form and without increased pain in order for improved functional LE strength and to assist with returning to gardening activities.   Baseline:  Goal status: INITIAL     PLAN:  PT FREQUENCY: 1x/week x 2-3 weeks and 2x/wk afterwards  PT DURATION: 12-14 weeks  PLANNED INTERVENTIONS: 97164- PT Re-evaluation, 97110-Therapeutic exercises, 97530- Therapeutic activity, [id number]- Neuromuscular re-education, 97535- Self Care, 09811- Manual therapy, [id number]- Gait training, [phone]- Aquatic Therapy, 97014- Electrical stimulation (unattended), [phone]- Ultrasound, Patient/Family education, Balance training, Stair training, Taping, Dry Needling, Joint mobilization, Scar mobilization, Cryotherapy, and Moist heat  PLAN FOR NEXT SESSION:  Cont per Dr. Serena Croissant gluteus medius repair.  Be aware of healing ulcer in distal L LE at post heel/ankle.     Reola Mosher Cyncere Ruhe, PT 06/09/2023, 11:46 AM

## 2023-06-10 ENCOUNTER — Encounter: Payer: Self-pay | Admitting: Family Medicine

## 2023-06-10 ENCOUNTER — Ambulatory Visit: Payer: Medicare Other | Admitting: Family Medicine

## 2023-06-10 VITALS — BP 166/70 | HR 78 | Temp 97.1°F | Ht 61.0 in | Wt 112.8 lb

## 2023-06-10 DIAGNOSIS — R600 Localized edema: Secondary | ICD-10-CM | POA: Diagnosis not present

## 2023-06-10 DIAGNOSIS — I10 Essential (primary) hypertension: Secondary | ICD-10-CM

## 2023-06-10 DIAGNOSIS — L97301 Non-pressure chronic ulcer of unspecified ankle limited to breakdown of skin: Secondary | ICD-10-CM | POA: Diagnosis not present

## 2023-06-10 MED ORDER — CEPHALEXIN 500 MG PO CAPS
500.0000 mg | ORAL_CAPSULE | Freq: Four times a day (QID) | ORAL | 0 refills | Status: AC
Start: 2023-06-10 — End: 2023-06-18

## 2023-06-10 MED ORDER — FUROSEMIDE 40 MG PO TABS: 40.0000 mg | ORAL_TABLET | Freq: Every day | ORAL | Status: DC

## 2023-06-10 NOTE — Assessment & Plan Note (Signed)
Improving.  She was not able to start torsemide due to insurance and cost issues.  She has been consistent with Lasix 40 mg daily.  She is also working on lifestyle interventions including keeping legs elevated and salt avoidance.  Her symptoms are improving.  We did discuss referral to see wound care however given improvement symptoms he would like to hold off on this for a few more weeks.  If symptoms continue to be an issue the office will need to place referral to wound care.

## 2023-06-10 NOTE — Patient Instructions (Signed)
It was very nice to see you today!  I am glad that your symptoms are improving.  Will give you another round of Keflex.  Please continue your treatment plan otherwise.  Return if symptoms worsen or fail to improve.   Take care, Dr Jimmey Ralph  PLEASE NOTE:  If you had any lab tests, please let us know if you have not heard back within a few days. You may see your results on mychart before we have a chance to review them but we will give you a call once they are reviewed by [place].   If we ordered any referrals today, please let us know if you have not heard from their office within the next week.   If you had any urgent prescriptions sent in today, please check with the pharmacy within an hour of our visit to make sure the prescription was transmitted appropriately.   Please try these tips to maintain a healthy lifestyle:  Eat at least 3 REAL meals and 1-2 snacks per day.  Aim for no more than 5 hours between eating.  If you eat breakfast, please do so within one hour of getting up.   Each meal should contain half fruits/vegetables, one quarter protein, and one quarter carbs (no bigger than a computer mouse)  Cut down on sweet beverages. This includes juice, soda, and sweet tea.   Drink at least 1 glass of water with each meal and aim for at least 8 glasses per day  Exercise at least 150 minutes every week.

## 2023-06-10 NOTE — Progress Notes (Signed)
   Ellen [name] is a 83 y.o. female who presents today for an office visit.  Assessment/Plan:  New/Acute Problems: Cellulitis  Improved with Keflex though still does have a small amount of surrounding erythema and pain.  Will give another course of Keflex.  No red flags or signs of systemic illness.  She will let us know if this does not resolve over the next 1 to 2 weeks.  Chronic Problems Addressed Today: Leg edema Improving.  She was not able to start torsemide due to insurance and cost issues.  She has been consistent with Lasix 40 mg daily.  She is also working on lifestyle interventions including keeping legs elevated and salt avoidance.  Her symptoms are improving.  We did discuss referral to see wound care however given improvement symptoms he would like to hold off on this for a few more weeks.  If symptoms continue to be an issue the office will need to place referral to wound care.  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  Her symptoms are improving.  She will continue Lasix 40 mg daily as above as well as her conservative measures including elevation, compression, and salt avoidance.  She will let us know if not improving in the next few weeks and we can refer to wound care center.  I have  Hypertension At goal 2 weeks ago.  Mildly elevated today.  Will continue losartan 50 mg daily and HCTZ 12.5 mg daily.  She will continue monitor at home and let us know if persistently elevated.     Subjective:  HPI:  See Assessment / plan for status of chronic conditions.  Patient here for follow-up.  We saw her a couple weeks ago due to lower extremity ulcer and cellulitis.  At that time we started her on a round of Keflex for cellulitis.  We also switched her Lasix to torsemide 40 mg daily. She was not able to start this due to to not be covered by insurance and being cost prohibitive.  She has continued using compression stockings, keeping  her feet elevated, and avoiding salt.  Her swelling has improved significantly last couple of weeks.  She still does have an ulcer on the back of her left heel.  This may be improving the last few days.  She did recently start using an over-the-counter cream to the area which does seem to be helping.  She would like another round of Keflex as she does think this helps significantly with the pain and swelling.       Objective:  Physical Exam: BP (!) 166/70   Pulse 78   Temp (!) 97.1 F (36.2 C) (Temporal)   Ht 5\' 1"  (1.549 m)   Wt 112 lb 12.8 oz (51.2 kg)   SpO2 98%   BMI 21.31 kg/m   Gen: No acute distress, resting comfortably CV: Regular rate and rhythm with no murmurs appreciated Pulm: Normal work of breathing, clear to auscultation bilaterally with no crackles, wheezes, or rhonchi Skin: Approximately 2 to 3 cm ulcer on posterior aspect of left ankle near insertion of calcaneus.  Surrounding erythema and warmth improved compared to previous exam MUSCULOSKELETAL: 1+ pitting edema to knees bilaterally.  Neuro: Grossly normal, moves all extremities Psych: Normal affect and thought content      Macoy Rodwell M. Jimmey Ralph, MD 06/10/2023 10:15 AM

## 2023-06-10 NOTE — Assessment & Plan Note (Signed)
At goal 2 weeks ago.  Mildly elevated today.  Will continue losartan 50 mg daily and HCTZ 12.5 mg daily.  She will continue monitor at home and let us know if persistently elevated.

## 2023-06-10 NOTE — Assessment & Plan Note (Signed)
Secondary to venous stasis.  Her symptoms are improving.  She will continue Lasix 40 mg daily as above as well as her conservative measures including elevation, compression, and salt avoidance.  She will let us know if not improving in the next few weeks and we can refer to wound care center.  I have

## 2023-06-12 ENCOUNTER — Other Ambulatory Visit: Payer: Self-pay | Admitting: Family Medicine

## 2023-06-18 ENCOUNTER — Ambulatory Visit (HOSPITAL_BASED_OUTPATIENT_CLINIC_OR_DEPARTMENT_OTHER): Payer: Medicare Other | Admitting: Orthopaedic Surgery

## 2023-06-18 DIAGNOSIS — M67959 Unspecified disorder of synovium and tendon, unspecified thigh: Secondary | ICD-10-CM

## 2023-06-18 DIAGNOSIS — M67952 Unspecified disorder of synovium and tendon, left thigh: Secondary | ICD-10-CM

## 2023-06-18 DIAGNOSIS — M25552 Pain in left hip: Secondary | ICD-10-CM

## 2023-06-18 MED ORDER — LIDOCAINE HCL 1 % IJ SOLN
4.0000 mL | INTRAMUSCULAR | Status: AC | PRN
  Administered 2023-06-18: 4 mL

## 2023-06-18 MED ORDER — TRIAMCINOLONE ACETONIDE 40 MG/ML IJ SUSP
80.0000 mg | INTRAMUSCULAR | Status: AC | PRN
  Administered 2023-06-18: 80 mg via INTRA_ARTICULAR

## 2023-06-18 NOTE — Progress Notes (Signed)
Post Operative Evaluation    Procedure/Date of Surgery: Left hip gluteus medius repair 11/4  Interval History:    Presents today 12 weeks status post the above procedure.  Having pain about the lateral aspect of the hip over the last several weeks.  She is feeling like she is walking sideways as there has been some glue weakness.  PMH/PSH/Family History/Social History/Meds/Allergies:    Past Medical History:  Diagnosis Date  . Allergy   . Anxiety   . Arthritis   . Bursitis    left thigh  . GERD (gastroesophageal reflux disease)   . Hypertension   . Kidney stones   . 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 Umbilical hernia    Past Surgical History:  Procedure Laterality Date  . BOWEL RESECTION  07/09/2013   Procedure: SMALL BOWEL RESECTION;  Surgeon: Velora Heckler, MD;  Location: [facility];  Service: General;;  . [place] REPAIR Left 03/22/2023   Procedure: LEFT GLUTEUS MEDIUS REPAIR WITH POSSIBLE COLLAGEN PATCH AUGMENTATION;  Surgeon: Huel Cote, MD;  Location: [facility];  Service: Orthopedics;  Laterality: Left;  . LAPAROTOMY N/A 07/09/2013   Procedure: EXPLORATORY LAPAROTOMY;  Surgeon: Velora Heckler, MD;  Location: [facility];  Service: General;  Laterality: N/A;  . SHOULDER ARTHROSCOPY WITH CAPSULORRHAPHY     Social History   Socioeconomic History  . Marital status: Married    Spouse name: Not on file  . Number of children: 0  . Years of education: Not on file  . Highest education level: Not on file  Occupational History  . Occupation: Retired   Tobacco Use  . Smoking status: Former    Types: Cigarettes  . Smokeless tobacco: Never  Vaping Use  . Vaping status: Never Used  Substance and Sexual Activity  . Alcohol use: Yes    Alcohol/week: 7.0 standard drinks of alcohol    Types: 7 Glasses of wine per week    Comment: nightly glass of wine  . Drug use:  No  . Sexual activity: Not Currently  Other Topics Concern  . Not on file  Social History Narrative   Enjoys gardening    Social Drivers of Health   Financial Resource Strain: Low Risk  (07/16/2022)   Overall Financial Resource Strain (CARDIA)   . Difficulty of Paying Living Expenses: Not hard at all  Food Insecurity: No Food Insecurity (12/30/2022)   Hunger Vital Sign   . Worried About Programme researcher, broadcasting/film/video in the Last Year: Never true   . Ran Out of Food in the Last Year: Never true  Transportation Needs: No Transportation Needs (12/30/2022)   PRAPARE - Transportation   . Lack of Transportation (Medical): No   . Lack of Transportation (Non-Medical): No  Physical Activity: Sufficiently Active (07/16/2022)   Exercise Vital Sign   . Days of Exercise per Week: 5 days   . Minutes of Exercise per Session: 120 min  Stress: No Stress Concern Present (07/16/2022)   [employer] of Occupational Health - Occupational Stress Questionnaire   . Feeling of Stress : Not at all  Social Connections: Moderately Integrated (07/16/2022)   Social Connection and Isolation Panel [NHANES]   . Frequency of Communication with Friends and Family: More than three times a  week   . Frequency of Social Gatherings with Friends and Family: More than three times a week   . Attends Religious Services: More than 4 times per year   . Active Member of Clubs or Organizations: No   . Attends Banker Meetings: Never   . Marital Status: Married   Family History  Problem Relation Age of Onset  . Diabetes Mother   . Heart attack Mother   . CAD Father   . Heart attack Father   . Breast cancer Neg Hx   . Colon cancer Neg Hx   . Esophageal cancer Neg Hx   . Rectal cancer Neg Hx   . Stomach cancer Neg Hx    Allergies  Allergen Reactions  . Lactose Intolerance (Gi) Diarrhea  . Sulfa Antibiotics Nausea And Vomiting and Swelling   Current Outpatient Medications  Medication Sig Dispense Refill  .  Calcium Carbonate Antacid (TUMS PO) Take 1 tablet by mouth as needed (reflux).    . Carboxymethylcellulose Sodium (EYE DROPS OP) Place 2 drops into both eyes daily. Thera Tears    . cephALEXin (KEFLEX) 500 MG capsule Take 1 capsule (500 mg total) by mouth 4 (four) times daily for 8 days. Take for 7 days 28 capsule 0  . furosemide (LASIX) 40 MG tablet Take 1 tablet (40 mg total) by mouth daily.    . hydrochlorothiazide (MICROZIDE) 12.5 MG capsule TAKE 1 CAPSULE(12.5 MG) BY MOUTH DAILY 90 capsule 1  . losartan (COZAAR) 50 MG tablet TAKE 1 TABLET(50 MG) BY MOUTH DAILY 90 tablet 1  . mupirocin ointment (BACTROBAN) 2 % Apply 1 Application topically 2 (two) times daily. 30 g 3  . pantoprazole (PROTONIX) 40 MG tablet TAKE 1 TABLET(40 MG) BY MOUTH DAILY 90 tablet 0  . PREMPRO 0.45-1.5 MG tablet TAKE 1 TABLET BY MOUTH DAILY 28 tablet 0   No current facility-administered medications for this visit.   No results found.   Review of Systems:   A ROS was performed including pertinent positives and negatives as documented in the HPI.   Musculoskeletal Exam:    There were no vitals taken for this visit.  Left hip 30 degrees internal/external rotation without pain.  Incision is well-appearing without erythema or drainage.  Distal neurosensory exam is intact  Imaging:      I personally reviewed and interpreted the radiographs.   Assessment:   12 weeks status post left hip gluteus medius repair.  At this time we will continue to work through physical therapy for strengthening and endurance.  At this time she has somewhat plateaued as she is having some glue weakness.  She is having some tenderness and to that effect I would recommend injection over the lateral aspect of the trochanter as her repair is now likely healed.  We will plan to proceed with this today.  I will plan to check her progress in 4-8 weeks Plan :    -Return to clinic 6 weeks for reassessment   Left hip ultrasound-guided  injection provided verbal consent obtained    Procedure Note  Patient: Nira Visscher             Date of Birth: [birth year]           MRN: [id number]             Visit Date: 06/18/2023  Procedures: Visit Diagnoses: No diagnosis found.  Large Joint Inj: L greater trochanter on 06/18/2023 12:34 PM Indications: pain Details: 22 G 3.5  in needle, ultrasound-guided anterolateral approach  Arthrogram: No  Medications: 4 mL lidocaine 1 %; 80 mg triamcinolone acetonide 40 MG/ML Outcome: tolerated well, no immediate complications Procedure, treatment alternatives, risks and benefits explained, specific risks discussed. Consent was given by the patient. Immediately prior to procedure a time out was called to verify the correct patient, procedure, equipment, support staff and site/side marked as required. Patient was prepped and draped in the usual sterile fashion.        I personally saw and evaluated the patient, and participated in the management and treatment plan.  Huel Cote, MD Attending Physician, Orthopedic Surgery  This document was dictated using Dragon voice recognition software. A reasonable attempt at proof reading has been made to minimize errors.

## 2023-06-21 NOTE — Telephone Encounter (Unsigned)
Copied from CRM [id number]. Topic: Clinical - Medical Advice >> Jun 21, 2023 11:10 AM Elizebeth Brooking wrote: Reason for CRM: Patient finished second round of antibiotic thinks she needs a 3rd is that okay if so can it be sent to the pharmacy cephALEXin (KEFLEX) 500 MG capsule

## 2023-06-22 ENCOUNTER — Encounter (HOSPITAL_BASED_OUTPATIENT_CLINIC_OR_DEPARTMENT_OTHER): Payer: Self-pay | Admitting: Physical Therapy

## 2023-06-22 ENCOUNTER — Ambulatory Visit (HOSPITAL_BASED_OUTPATIENT_CLINIC_OR_DEPARTMENT_OTHER): Payer: Medicare Other | Attending: Orthopaedic Surgery | Admitting: Physical Therapy

## 2023-06-22 DIAGNOSIS — M25552 Pain in left hip: Secondary | ICD-10-CM | POA: Diagnosis not present

## 2023-06-22 DIAGNOSIS — R262 Difficulty in walking, not elsewhere classified: Secondary | ICD-10-CM | POA: Diagnosis not present

## 2023-06-22 DIAGNOSIS — M25652 Stiffness of left hip, not elsewhere classified: Secondary | ICD-10-CM | POA: Insufficient documentation

## 2023-06-22 DIAGNOSIS — M6281 Muscle weakness (generalized): Secondary | ICD-10-CM | POA: Diagnosis not present

## 2023-06-22 NOTE — Therapy (Signed)
 OUTPATIENT PHYSICAL THERAPY TREATMENT   Patient Name: Ellen [name] MRN: [id number] DOB:1940/10/30, 83 y.o., female Today's Date: 06/23/2023    END OF SESSION:      PT End of Session - 06/22/23        Visit Number 11     Number of Visits 25     Date for PT Re-Evaluation 07/07/23     Authorization Type MCR A & B     Progress Note Due on Visit     PT Start Time 1023     PT Stop Time 1104     PT Time Calculation (min) 41 min     Activity Tolerance Patient tolerated treatment well     Behavior During Therapy WFL for tasks assessed/performed           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   Left Buccal Cheek (Keratoacanthoma)   Umbilical hernia    Past Surgical History:  Procedure Laterality Date   BOWEL RESECTION  07/09/2013   Procedure: SMALL BOWEL RESECTION;  Surgeon: Krystal CHRISTELLA Spinner, MD;  Location: [facility];  Service: General;;   RODDIE NEVIN REPAIR Left 03/22/2023   Procedure: LEFT GLUTEUS MEDIUS REPAIR WITH POSSIBLE COLLAGEN PATCH AUGMENTATION;  Surgeon: Genelle Standing, MD;  Location: [facility];  Service: Orthopedics;  Laterality: Left;   LAPAROTOMY N/A 07/09/2013   Procedure: EXPLORATORY LAPAROTOMY;  Surgeon: Krystal CHRISTELLA Spinner, MD;  Location: [facility];  Service: General;  Laterality: N/A;   SHOULDER ARTHROSCOPY WITH CAPSULORRHAPHY     Patient Active Problem List   Diagnosis Date Noted   Skin ulcer of ankle, limited to breakdown of skin (HCC) 04/28/2023   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   Hypokalemia 12/27/2022   Volume depletion 12/24/2022   Constipation 12/24/2022   Partial small bowel obstruction (HCC) 12/24/2022   Polycythemia 12/24/2022   Gastroesophageal reflux disease 08/26/2022   Bloating 08/26/2022   Small intestinal bacterial  overgrowth (SIBO) 08/26/2022   Osteoarthritis 12/01/2021   Plantar wart 12/01/2021   Ventral hernia 01/01/2021   Diverticulosis 01/05/2019   Dermatitis 01/05/2019   Systolic murmur 07/25/2018   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   Hyperglycemia 10/05/2017   Leg edema 10/05/2017   Hammer toe 10/05/2017   Hot flashes due to menopause 10/05/2017   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/10/2013 07/11/2013   Hypertension 07/09/2013    REFERRING PROVIDER: Genelle Standing, MD  REFERRING DIAG: [id number] (ICD-10-CM) - Tendinopathy of gluteus medius  S/p glute med repair  THERAPY DIAG:  Pain in left hip  Muscle weakness (generalized)  Stiffness of left hip, not elsewhere classified  Difficulty in walking, not elsewhere classified  Rationale for Evaluation and Treatment: Rehabilitation  ONSET DATE: DOS 03/22/2023  Days since surgery: 92   SUBJECTIVE:   SUBJECTIVE STATEMENT: Pt is 13 weeks and 1 days s/p L hip glute med repair.  She  saw Dr. Genelle on 1/31 and received a L GT injection.  She reports the relief from the injection lasted 2-3 days.  MD note indicated for pt to continue with PT.  Pt states she had increased pain after prior Rx which lasted the rest of the day.  She has been using a rolling pin on her  hip and leg which has helped.  Pt used the leg press the treatment before last and states she had increased pain with the leg press.  She uses the walker 1st thing in the AM though has not been using her cane.  Pt reports compliance with HEP.    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    PAIN:  NPRS:  1/10 current, 5/10 worst.  Worst pain is at the end of the day.   Location: L lateral hip  PRECAUTIONS: Other: per surgical protocol, Ulcer on L heel/post ankle/achilles, lumbar anterolisthesis, double hernia    WEIGHT BEARING RESTRICTIONS: Yes WBAT  FALLS:  Has patient fallen in last 6 months? No  LIVING ENVIRONMENT: Lives with: lives with their spouse Lives in: home; lives on 1 floor though does have to go to basement. 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    PLOF: Independent  Pt performed a walking program and would walk 1-1.5 hours daily.  Pt was ambulating without AD.    PATIENT GOALS: to walk straight and walk normally, improve strength  NEXT MD VISIT: 05/07/2023  OBJECTIVE:  Note: Objective measures were completed at Evaluation unless otherwise noted.   DIAGNOSTIC FINDINGS: Pt is post op.   PATIENT SURVEYS:  FOTO 55 with a goal of 73 at visit 15 06/09/23: 49% function   COGNITION: Overall cognitive status: Within functional limits for tasks assessed              OBSERVATION:  Incision on lateral L hip is intact with no signs of infection.  Incision is clean and dry.  Healing ulcer on L posterior heel/ankle/achilles.  Pt removed her band-aid.  PT brought band-aids in to cover ulcer though pt declined band-aids.  She states she doesn't need a band aid and it will be fine returning home.     GAIT: Comments: Pt ambulating with SPC.  She denies pain with ambulation.  Slow gait speed.  Decreased toe off on L and increased stance time on R.     LOWER EXTREMITY MMT:  MMT Right 06/09/2023 Left 06/09/2023  Hip flexion 31.6 36.8  Hip extension    Hip abduction 45.9 40.7  Hip adduction    Hip internal rotation    Hip external rotation    Knee flexion    Knee extension 38.4 31.4  Ankle dorsiflexion    Ankle plantarflexion    Ankle inversion    Ankle eversion      (Blank rows = not tested) *= pain   TODAY'S TREATMENT:    06/22/23 Nustep lvl 4, 5 min UEs/LEs  Step ups 4 inch step 2x10 Sidestepping with UE's on rail x 2 laps Hip hiking with Ue's on rail 2x10 bilat Standing hip abd 2x10 L LE, 1x10 R LE Stool rotations 2 x 10 reps Staggered stance on airex with L LE back 2x30 sec Standing marching 2x10 LAQ 2#  2x10   06/09/23 Nustep lvl 3, 5 min UEs/LEs  Reassessment  1/2 kneeling hip flexor stretch 3 x 20 second holds Step up 4 inch 2 x 10  Lateral step up 4 inch 2 x 10 Self STM with roller to L quads - 3 minutes  Lateral stepping 4 x 8 feet bilateral   06/03/2023 Nustep lvl 3, 5 min UEs/LEs  Cybex leg press  30# x10, 35# x10 Standing marching x10 reps, x5 reps with UE support Standing hip abd 2x10 L LE only Stool rotations 2 x 10 reps Supine bridge 2x10 LAQ 2# 3x10 Weight shifts on airex s/s without UE support x 10 reps and f/b with UE support x 10 reps Staggered stance on airex with L LE back x 30 sec  Pt received L hip flexion, ER, and IR PROM per pt and tissue tolerance w/n protocol ranges  05/31/2023 Reviewed current function, response to prior Rx, pain levels, and HEP compliance.  Nustep lvl 3, 5 min UEs/LEs  Supine bridge 2x10 Hip abduction submax isometric- seated 5 2x10 Standing hip abd 2x10 L LE only Stool rotations 2 x 10 reps LAQ 1# x10, 2# 2x10 Standing marching with UE support 2x10  Pt received L hip flexion, abd, ER, and IR PROM per pt and tissue tolerance w/n protocol ranges   05/20/2023 Reviewed current function, response to prior Rx, pain levels, and HEP compliance.   Pt performed: Nustep lvl 3, 5 min UEs/LEs   Supine bridge 2x10  Qped rocking 2x10  LAQ x 10, 1# 2 x 10  Hip flexor submax isometric in seated 5 x10  Hip abduction submax isometric- seated 5 2x10  Stool rotations approx 10 reps Pt received L hip flexion, abd, ER, and IR PROM per pt and tissue tolerance w/n protocol ranges                                                                                                                                PATIENT EDUCATION:  Education details: dx, relevant anatomy, HEP, POC, exercise form, rationale of interventions, and post op and protocol restrictions/limitations.  06/09/23: reassessment findings Person educated: Patient Education method: Explanation, Demonstration, Tactile cues, Verbal cues, and Handouts Education comprehension: verbalized understanding, returned demonstration, verbal cues required, tactile cues required, and needs further education  HOME EXERCISE PROGRAM: Access Code: [email or web address] URL: [email or web address] Date: 04/14/2023 Prepared by: Mose Minerva  Exercises - Supine Quadricep Sets  - 2 x daily - 7 x weekly - 2 sets - 10 reps - 5 seconds hold - Supine Posterior Pelvic Tilt  - 2 x daily - 7 x weekly - 2 sets - 10 reps  04/23/23- Hooklying Gluteal Sets  - 1 x daily - 7 x weekly - 5 reps - 10 second hold - Supine Hip Adduction Isometric with Ball  - 1 x daily - 7 x weekly - 10 reps - 10 second hold - Seated Heel Squeeze  - 1 x daily - 7 x weekly - 10 reps - 10 second hold - Seated Long Arc Quad  - 1 x daily - 7 x weekly - 10 reps -  5 second hold  ASSESSMENT:  CLINICAL IMPRESSION: Pt presents to Rx without cane and is decreasing the usage of an AD.  Pt is improving with hip strength and tolerance to exercises as evidenced by performance of exercises.  She performed exercises per protocol well with cuing and instruction in correct form.  Pt responded well to Rx stating her hip feels a little tender, but has no increased pain.  She stated feels like I worked it.  Pt should benefit from cont skilled PT per protocol to address impairments and goals and to assist in restoring desired level of function.      OBJECTIVE IMPAIRMENTS: Abnormal gait, decreased activity tolerance, decreased endurance, decreased mobility, difficulty walking, decreased ROM,  decreased strength, hypomobility, and pain.   ACTIVITY LIMITATIONS: carrying, lifting, standing, squatting, stairs, transfers, and locomotion level  PARTICIPATION LIMITATIONS: cleaning and yard work  PERSONAL FACTORS: 3+ comorbidities: L LE ulcer, double hernia, arthritis  are also affecting patient's functional outcome.   REHAB POTENTIAL: Good  CLINICAL DECISION MAKING: Stable/uncomplicated  EVALUATION COMPLEXITY: Low   GOALS:   SHORT TERM GOALS:  Pt will be independent and compliant with HEP for improved pain, strength, and function. Baseline: Goal status: MET Target date:  05/05/2023   2.  Pt will demo improved quality of gait with increased stance time and toe off on L.   Baseline:  Goal status: INITIAL Target date:  05/12/2023  3.  Pt will progress with exercises per protocol without adverse effects for improved strength and mobility.   Baseline:  Goal status: INITIAL Target date:  05/26/2023   4.  Pt will report improved tolerance with standing activities and ambulation.  Baseline:  Goal status: INITIAL Target date:  06/16/2023   5.  Pt will ambulate with a normalized heel to toe gait without limping.  Baseline:  Goal status: INITIAL Target date:  06/23/2023   6.  Pt will be able to perform a 6 inch step up with good form and control with L LE leading for improved performance of stairs abd and improved functional strength. Baseline:  Goal status: INITIAL Target date:  06/30/2023   LONG TERM GOALS: Target date: 07/21/2023   Pt will be able to ambulate extended community distance without significant difficulty and pain.   Baseline:  Goal status: INITIAL  2.  Pt will be able to perform her ADLs and IADLs including household chores without significant difficulty and pain. Baseline:  Goal status: INITIAL  3.  Pt will be able to take care of her husband without significant limitations.   Baseline:  Goal status: INITIAL  4.  Pt will be able to perform mini  squats with good form and without increased pain in order for improved functional LE strength and to assist with returning to gardening activities.   Baseline:  Goal status: INITIAL     PLAN:  PT FREQUENCY: 1x/week x 2-3 weeks and 2x/wk afterwards  PT DURATION: 12-14 weeks  PLANNED INTERVENTIONS: 97164- PT Re-evaluation, 97110-Therapeutic exercises, 97530- Therapeutic activity, [id number]- Neuromuscular re-education, 97535- Self Care, 02859- Manual therapy, [id number]- Gait training, [phone]- Aquatic Therapy, 97014- Electrical stimulation (unattended), [phone]- Ultrasound, Patient/Family education, Balance training, Stair training, Taping, Dry Needling, Joint mobilization, Scar mobilization, Cryotherapy, and Moist heat  PLAN FOR NEXT SESSION:  Cont per Dr. Danetta gluteus medius repair.  Be aware of healing ulcer in distal L LE at post heel/ankle.     Leigh Minerva III PT, DPT 06/23/23 7:11 PM

## 2023-06-27 NOTE — Therapy (Signed)
OUTPATIENT PHYSICAL THERAPY TREATMENT   Patient Name: Ellen [name] MRN: [id number] DOB:03-18-1941, 83 y.o., female Today's Date: 06/29/2023    END OF SESSION:  PT End of Session - 06/28/23 1333     Visit Number 12    Number of Visits 25    Date for PT Re-Evaluation 07/07/23    Authorization Type MCR A & B    PT Start Time 1320    PT Stop Time 1400    PT Time Calculation (min) 40 min    Activity Tolerance Patient tolerated treatment well    Behavior During Therapy WFL for tasks assessed/performed                 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   Left Buccal Cheek (Keratoacanthoma)   Umbilical hernia    Past Surgical History:  Procedure Laterality Date   BOWEL RESECTION  07/09/2013   Procedure: SMALL BOWEL RESECTION;  Surgeon: Velora Heckler, MD;  Location: [facility];  Service: General;;   Brock Bad REPAIR Left 03/22/2023   Procedure: LEFT GLUTEUS MEDIUS REPAIR WITH POSSIBLE COLLAGEN PATCH AUGMENTATION;  Surgeon: Huel Cote, MD;  Location: [facility];  Service: Orthopedics;  Laterality: Left;   LAPAROTOMY N/A 07/09/2013   Procedure: EXPLORATORY LAPAROTOMY;  Surgeon: Velora Heckler, MD;  Location: [facility];  Service: General;  Laterality: N/A;   SHOULDER ARTHROSCOPY WITH CAPSULORRHAPHY     Patient Active Problem List   Diagnosis Date Noted   Skin ulcer of ankle, limited to breakdown of skin (HCC) 04/28/2023   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   Hypokalemia 12/27/2022   Volume depletion 12/24/2022   Constipation 12/24/2022   Partial small bowel obstruction (HCC) 12/24/2022   Polycythemia 12/24/2022   Gastroesophageal reflux disease 08/26/2022   Bloating 08/26/2022   Small intestinal bacterial overgrowth (SIBO) 08/26/2022    Osteoarthritis 12/01/2021   Plantar wart 12/01/2021   Ventral hernia 01/01/2021   Diverticulosis 01/05/2019   Dermatitis 01/05/2019   Systolic murmur 07/25/2018   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   Hyperglycemia 10/05/2017   Leg edema 10/05/2017   Hammer toe 10/05/2017   Hot flashes due to menopause 10/05/2017   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/10/2013 07/11/2013   Hypertension 07/09/2013    REFERRING PROVIDER: Huel Cote, MD  REFERRING DIAG: [id number] (ICD-10-CM) - Tendinopathy of gluteus medius  S/p glute med repair  THERAPY DIAG:  Pain in left hip  Muscle weakness (generalized)  Stiffness of left hip, not elsewhere classified  Difficulty in walking, not elsewhere classified  Rationale for Evaluation and Treatment: Rehabilitation  ONSET DATE: DOS 03/22/2023  Days since surgery: 98   SUBJECTIVE:   SUBJECTIVE STATEMENT: Pt is 14 weeks s/p L hip glute med repair.  Pt denies any adverse effects after prior Rx, just some soreness which is normal.  Pt reports compliance with HEP.  Pt has been gardening.  Pt states she has been pulling bags of mulch and has some pain afterwards.  Pt states she saw her PCP this AM and he informed her she may have a pinched a nerve.  Pt has been using her walker 1st thing in the AM, but has not been using her cane.  She  states she has felt fine not using her cane.       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    PAIN:  NPRS:  3/10 current, 5/10 worst.  Worst pain is at the end of the day.  Location: L lateral hip  PRECAUTIONS: Other: per surgical protocol, Ulcer on L heel/post ankle/achilles, lumbar anterolisthesis, double  hernia    WEIGHT BEARING RESTRICTIONS: Yes WBAT  FALLS:  Has patient fallen in last 6 months? No  LIVING ENVIRONMENT: Lives with: lives with their spouse Lives in: home; lives on 1 floor though does have to go to basement. 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  Pt was ambulating without AD.    PATIENT GOALS: to walk straight and walk normally, improve strength  NEXT MD VISIT: 05/07/2023  OBJECTIVE:  Note: Objective measures were completed at Evaluation unless otherwise noted.   DIAGNOSTIC FINDINGS: Pt is post op.      TODAY'S TREATMENT:    06/22/23 Nustep lvl 4, 5 min UEs/LEs  Sidestepping with UE's on rail x 2 laps Hip hiking with Ue's on rail 2x10 bilat Step ups 4 inch step 2x10 Attempted lateral step ups on 4 inch step though stopped due to pain. Mini squats 2x10 with UE support on rail Standing hip abd 2x10 bilat Stool rotations 2 x 10 reps LAQ 2#  3x10   06/09/23 Nustep lvl 3, 5 min UEs/LEs  Reassessment  1/2 kneeling hip flexor stretch 3 x 20 second holds Step up 4 inch 2 x 10  Lateral step up 4 inch 2 x 10 Self STM with roller to L quads - 3 minutes  Lateral stepping 4 x 8 feet bilateral   06/03/2023 Nustep lvl 3, 5 min UEs/LEs  Cybex leg press  30# x10, 35# x10 Standing marching x10 reps, x5 reps with UE support Standing hip abd 2x10 L LE only Stool rotations 2 x 10 reps Supine bridge 2x10 LAQ 2# 3x10 Weight shifts on airex s/s without UE support x 10 reps and f/b with UE support x 10 reps Staggered stance on airex with L LE back x 30 sec  Pt received L hip flexion, ER, and IR PROM per pt and tissue tolerance w/n protocol ranges  05/31/2023 Reviewed current function, response to prior Rx, pain  levels, and HEP compliance.  Nustep lvl 3, 5 min UEs/LEs  Supine bridge 2x10 Hip abduction submax isometric- seated 5" 2x10 Standing hip abd 2x10 L LE only Stool rotations 2 x 10 reps LAQ 1# x10, 2# 2x10 Standing marching with UE support 2x10  Pt received L hip flexion, abd, ER, and IR PROM per pt and tissue tolerance w/n protocol ranges  PATIENT EDUCATION:  Education details:  PT instructed her to not be carrying or pulling bags of mulch.   dx, relevant anatomy, HEP, POC, exercise form, rationale of interventions, and post op and protocol restrictions/limitations.  06/09/23: reassessment findings Person educated: Patient Education method: Explanation, Demonstration, Tactile cues, Verbal cues, and Handouts Education comprehension: verbalized understanding, returned demonstration, verbal cues required, tactile cues required, and needs further education  HOME EXERCISE PROGRAM: Access Code: [email or web address] URL: [email or web address] Date: 04/14/2023 Prepared by: Aaron Edelman  Exercises - Supine Quadricep Sets  - 2 x daily - 7 x weekly - 2 sets - 10 reps - 5 seconds hold - Supine Posterior Pelvic Tilt  - 2 x daily - 7 x weekly - 2 sets - 10 reps  04/23/23- Hooklying Gluteal Sets  - 1 x daily - 7 x weekly - 5 reps - 10 second hold - Supine Hip Adduction Isometric with Ball  - 1 x daily - 7 x weekly - 10 reps - 10 second hold - Seated Heel Squeeze  - 1 x daily - 7 x weekly - 10 reps - 10 second hold - Seated Long Arc Quad  - 1 x daily - 7 x weekly - 10 reps - 5 second hold  ASSESSMENT:  CLINICAL IMPRESSION: Pt is progressing with strength, mobility, and tolerance to activity.  Pt is not using her cane anymore.  The only AD she is using is a walker 1st thing in AM.  Pt is progressing with protocol.  She performed exercises per protocol well with cuing and  instruction in correct form.  Pt does have some difficulty with step exercises. PT did not increase fwd step up height today due to pt having some pain with step ups.  Attempted lateral step ups on 4 inch step though stopped due to pain.  Pt able to perform mini squats well with instruction in correct form and bilat UE support.  Pt reports increased pain from 3/10 before Rx to 5/10 after Rx.  Pt should benefit from cont skilled PT per protocol to address impairments and goals and to assist in restoring desired level of function.      OBJECTIVE IMPAIRMENTS: Abnormal gait, decreased activity tolerance, decreased endurance, decreased mobility, difficulty walking, decreased ROM, decreased strength, hypomobility, and pain.   ACTIVITY LIMITATIONS: carrying, lifting, standing, squatting, stairs, transfers, and locomotion level  PARTICIPATION LIMITATIONS: cleaning and yard work  PERSONAL FACTORS: 3+ comorbidities: L LE ulcer, double hernia, arthritis  are also affecting patient's functional outcome.   REHAB POTENTIAL: Good  CLINICAL DECISION MAKING: Stable/uncomplicated  EVALUATION COMPLEXITY: Low   GOALS:   SHORT TERM GOALS:  Pt will be independent and compliant with HEP for improved pain, strength, and function. Baseline: Goal status: MET Target date:  05/05/2023   2.  Pt will demo improved quality of gait with increased stance time and toe off on L.   Baseline:  Goal status: INITIAL Target date:  05/12/2023  3.  Pt will progress with exercises per protocol without adverse effects for improved strength and mobility.   Baseline:  Goal status: INITIAL Target date:  05/26/2023   4.  Pt will report improved tolerance with standing activities and ambulation.  Baseline:  Goal status: INITIAL Target date:  06/16/2023   5.  Pt will ambulate with a normalized heel to toe gait without limping.  Baseline:  Goal status: INITIAL Target date:  06/23/2023   6.  Pt will be able to perform a  6 inch step  up with good form and control with L LE leading for improved performance of stairs abd and improved functional strength. Baseline:  Goal status: INITIAL Target date:  06/30/2023   LONG TERM GOALS: Target date: 07/21/2023   Pt will be able to ambulate extended community distance without significant difficulty and pain.   Baseline:  Goal status: INITIAL  2.  Pt will be able to perform her ADLs and IADLs including household chores without significant difficulty and pain. Baseline:  Goal status: INITIAL  3.  Pt will be able to take care of her husband without significant limitations.   Baseline:  Goal status: INITIAL  4.  Pt will be able to perform mini squats with good form and without increased pain in order for improved functional LE strength and to assist with returning to gardening activities.   Baseline:  Goal status: INITIAL     PLAN:  PT FREQUENCY: 1x/week x 2-3 weeks and 2x/wk afterwards  PT DURATION: 12-14 weeks  PLANNED INTERVENTIONS: 97164- PT Re-evaluation, 97110-Therapeutic exercises, 97530- Therapeutic activity, [id number]- Neuromuscular re-education, 97535- Self Care, 91478- Manual therapy, [id number]- Gait training, [phone]- Aquatic Therapy, 97014- Electrical stimulation (unattended), [phone]- Ultrasound, Patient/Family education, Balance training, Stair training, Taping, Dry Needling, Joint mobilization, Scar mobilization, Cryotherapy, and Moist heat  PLAN FOR NEXT SESSION:  Cont per Dr. Serena Croissant gluteus medius repair.  Be aware of healing ulcer in distal L LE at post heel/ankle.     Audie Clear III PT, DPT 06/29/23 1:34 PM

## 2023-06-28 ENCOUNTER — Encounter: Payer: Self-pay | Admitting: Family Medicine

## 2023-06-28 ENCOUNTER — Encounter (HOSPITAL_BASED_OUTPATIENT_CLINIC_OR_DEPARTMENT_OTHER): Payer: Self-pay | Admitting: Physical Therapy

## 2023-06-28 ENCOUNTER — Ambulatory Visit (HOSPITAL_BASED_OUTPATIENT_CLINIC_OR_DEPARTMENT_OTHER): Payer: Medicare Other | Admitting: Physical Therapy

## 2023-06-28 ENCOUNTER — Ambulatory Visit (INDEPENDENT_AMBULATORY_CARE_PROVIDER_SITE_OTHER): Payer: Medicare Other | Admitting: Family Medicine

## 2023-06-28 VITALS — BP 145/64 | HR 73 | Temp 97.2°F | Ht 61.0 in | Wt 114.4 lb

## 2023-06-28 DIAGNOSIS — M25652 Stiffness of left hip, not elsewhere classified: Secondary | ICD-10-CM | POA: Diagnosis not present

## 2023-06-28 DIAGNOSIS — R739 Hyperglycemia, unspecified: Secondary | ICD-10-CM

## 2023-06-28 DIAGNOSIS — L97301 Non-pressure chronic ulcer of unspecified ankle limited to breakdown of skin: Secondary | ICD-10-CM

## 2023-06-28 DIAGNOSIS — M25552 Pain in left hip: Secondary | ICD-10-CM

## 2023-06-28 DIAGNOSIS — R262 Difficulty in walking, not elsewhere classified: Secondary | ICD-10-CM

## 2023-06-28 DIAGNOSIS — R600 Localized edema: Secondary | ICD-10-CM

## 2023-06-28 DIAGNOSIS — R202 Paresthesia of skin: Secondary | ICD-10-CM

## 2023-06-28 DIAGNOSIS — M6281 Muscle weakness (generalized): Secondary | ICD-10-CM | POA: Diagnosis not present

## 2023-06-28 DIAGNOSIS — I10 Essential (primary) hypertension: Secondary | ICD-10-CM | POA: Diagnosis not present

## 2023-06-28 LAB — COMPREHENSIVE METABOLIC PANEL WITH GFR
ALT: 17 U/L (ref 0–35)
AST: 16 U/L (ref 0–37)
Albumin: 3.8 g/dL (ref 3.5–5.2)
Alkaline Phosphatase: 72 U/L (ref 39–117)
BUN: 31 mg/dL — ABNORMAL HIGH (ref 6–23)
CO2: 28 meq/L (ref 19–32)
Calcium: 9.4 mg/dL (ref 8.4–10.5)
Chloride: 102 meq/L (ref 96–112)
Creatinine, Ser: 0.6 mg/dL (ref 0.40–1.20)
GFR: 82.5 mL/min
Glucose, Bld: 111 mg/dL — ABNORMAL HIGH (ref 70–99)
Potassium: 3.7 meq/L (ref 3.5–5.1)
Sodium: 140 meq/L (ref 135–145)
Total Bilirubin: 0.6 mg/dL (ref 0.2–1.2)
Total Protein: 6 g/dL (ref 6.0–8.3)

## 2023-06-28 LAB — CBC
HCT: 44.8 % (ref 36.0–46.0)
Hemoglobin: 14.9 g/dL (ref 12.0–15.0)
MCHC: 33.2 g/dL (ref 30.0–36.0)
MCV: 96.8 fl (ref 78.0–100.0)
Platelets: 305 10*3/uL (ref 150.0–400.0)
RBC: 4.63 Mil/uL (ref 3.87–5.11)
RDW: 13.4 % (ref 11.5–15.5)
WBC: 8.1 10*3/uL (ref 4.0–10.5)

## 2023-06-28 LAB — VITAMIN B12: Vitamin B-12: 110 pg/mL — ABNORMAL LOW (ref 211–911)

## 2023-06-28 LAB — TSH: TSH: 0.46 u[IU]/mL (ref 0.35–5.50)

## 2023-06-28 LAB — HEMOGLOBIN A1C: Hgb A1c MFr Bld: 5.4 % (ref 4.6–6.5)

## 2023-06-28 MED ORDER — TRIPLE ANTIBIOTIC 3.5-400-5000 EX OINT: 1.0000 | TOPICAL_OINTMENT | Freq: Two times a day (BID) | CUTANEOUS | 0 refills | Status: DC

## 2023-06-28 NOTE — Patient Instructions (Addendum)
 It was very nice to see you today!  We will check blood work today.  I think you may have a pinched nerve in your back that is causing your symptoms.  We may need to have you see the sports medicine doctor depending on the results.  Please let me know if you would like to be seen at the wound care center.  Return if symptoms worsen or fail to improve.   Take care, Dr Daneil Dunker  PLEASE NOTE:  If you had any lab tests, please let us  know if you have not heard back within a few days. You may see your results on mychart before we have a chance to review them but we will give you a call once they are reviewed by us .   If we ordered any referrals today, please let us  know if you have not heard from their office within the next week.   If you had any urgent prescriptions sent in today, please check with the pharmacy within an hour of our visit to make sure the prescription was transmitted appropriately.   Please try these tips to maintain a healthy lifestyle:  Eat at least 3 REAL meals and 1-2 snacks per day.  Aim for no more than 5 hours between eating.  If you eat breakfast, please do so within one hour of getting up.   Each meal should contain half fruits/vegetables, one quarter protein, and one quarter carbs (no bigger than a computer mouse)  Cut down on sweet beverages. This includes juice, soda, and sweet tea.   Drink at least 1 glass of water with each meal and aim for at least 8 glasses per day  Exercise at least 150 minutes every week.

## 2023-06-28 NOTE — Assessment & Plan Note (Signed)
 Mildly elevated today though at goal per JNC 8.  Continue losartan  50 mg daily and HCTZ 12.5 mg daily.  She will continue to monitor at home and let us  know if persistently elevated.

## 2023-06-28 NOTE — Assessment & Plan Note (Signed)
 Check A1c.

## 2023-06-28 NOTE — Assessment & Plan Note (Addendum)
 Secondary to venous insufficiency.  Overall symptoms are stable since her last visit.  She will continue Lasix  40 mg daily.  She will continue conservative measures.  Still has trace edema but this is much better than previous exams.  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  We discussed referral to wound care however she would like to hold off on this for today.  We will send in prescription for antibiotic ointment per patient request.

## 2023-06-28 NOTE — Progress Notes (Signed)
 Ellen [name] is a 83 y.o. female who presents today for an office visit.  Assessment/Plan:  New/Acute Problems: Bilateral Leg Pain  No red flag signs or symptoms.  Patient's description of pain is consistent with neuropathic type pain.  She does have quite a bit of venous stasis as well however believe this is a separate issue as this has improved significantly recently however her pain has persisted.  Will check labs today to look for possible causes of neuropathy including CBC, c-Met, B12, A1c, and TSH.  Concern for spinal stenosis given that symptoms are worse at night when laying down and improves throughout the day.  If her labs are negative would consider having her follow back up with sports medicine to further evaluate.  Chronic Problems Addressed Today: Hyperglycemia Check A1c.   Leg edema Secondary to venous insufficiency.  Overall symptoms are stable since her last visit.  She will continue Lasix  40 mg daily.  She will continue conservative measures.  Still has trace edema but this is much better than previous exams.  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  We discussed referral to wound care however she would like to hold off on this for today.  We will send in prescription for antibiotic ointment per patient request.  Skin ulcer of ankle, limited to breakdown of skin (HCC) Secondary to her venous stasis.  Symptoms are stable.  We are addressing her venous stasis as above.  As above we also discussed referral to wound care however she declined.  Will send a prescription in for topical antibiotic ointment.  She will continue with conservative measures and Lasix  as above for her venous stasis.  She will let us  know if her symptoms worsen or if she changes her mind about referral to see wound care.  Could also consider referral back to vascular surgery at some point in the future if not improving.  Hypertension Mildly elevated today though at goal per JNC  8.  Continue losartan  50 mg daily and HCTZ 12.5 mg daily.  She will continue to monitor at home and let us  know if persistently elevated.     Subjective:  HPI:  See Assessment / plan for status of chronic conditions.  Patient is here today for follow-up.  Last saw her a couple weeks ago.  At that time her leg edema and venous stasis were improving with Lasix  40 mg daily in addition to her conservative measures with elevation, compression, and salt avoidance.  She is still having some issues ulcers.  Her primary concern today is burning and stinging in both of her legs.  This was going on for a while but does seem to be getting worse last couple of weeks.  Predominantly located in the back of her calves.  Symptoms are worse in the morning and at night.  Symptoms improved throughout the day.  No obvious injuries or other precipitating events.  The swelling in her legs is improving.  She does still have ulcers at the back cervix bilateral which has not changed significantly since our last visit.  No fevers or chills.  No reported numbness or tingling.       Objective:  Physical Exam: BP (!) 145/64   Pulse 73   Temp (!) 97.2 F (36.2 C) (Temporal)   Ht 5\' 1"  (1.549 m)   Wt 114 lb 6.4 oz (51.9 kg)   SpO2 97%   BMI 21.62 kg/m   Gen: No acute distress, resting  comfortably MUSCULOSKELETAL -2 to 3 cm ulcer on posterior right ankle.  Eschar present.  Granulation tissue present.  No surrounding erythema.  No drainage.  Trace to 1+ pitting edema up to knees bilaterally. Neuro: Grossly normal, moves all extremities Psych: Normal affect and thought content      Deberah Adolf M. Daneil Dunker, MD 06/28/2023 11:40 AM

## 2023-06-28 NOTE — Assessment & Plan Note (Signed)
 Secondary to her venous stasis.  Symptoms are stable.  We are addressing her venous stasis as above.  As above we also discussed referral to wound care however she declined.  Will send a prescription in for topical antibiotic ointment.  She will continue with conservative measures and Lasix  as above for her venous stasis.  She will let us  know if her symptoms worsen or if she changes her mind about referral to see wound care.  Could also consider referral back to vascular surgery at some point in the future if not improving.

## 2023-06-30 ENCOUNTER — Encounter: Payer: Self-pay | Admitting: Family Medicine

## 2023-06-30 NOTE — Telephone Encounter (Signed)
See note

## 2023-07-01 ENCOUNTER — Encounter: Payer: Self-pay | Admitting: Family Medicine

## 2023-07-01 ENCOUNTER — Ambulatory Visit (HOSPITAL_BASED_OUTPATIENT_CLINIC_OR_DEPARTMENT_OTHER): Payer: Medicare Other | Admitting: Physical Therapy

## 2023-07-01 DIAGNOSIS — M25652 Stiffness of left hip, not elsewhere classified: Secondary | ICD-10-CM

## 2023-07-01 DIAGNOSIS — M6281 Muscle weakness (generalized): Secondary | ICD-10-CM | POA: Diagnosis not present

## 2023-07-01 DIAGNOSIS — R262 Difficulty in walking, not elsewhere classified: Secondary | ICD-10-CM

## 2023-07-01 DIAGNOSIS — M25552 Pain in left hip: Secondary | ICD-10-CM

## 2023-07-01 NOTE — Telephone Encounter (Signed)
Please see result note.  Katina Degree. Jimmey Ralph, MD 07/01/2023 3:16 PM

## 2023-07-01 NOTE — Therapy (Unsigned)
OUTPATIENT PHYSICAL THERAPY TREATMENT   Patient Name: Ellen [name] MRN: [id number] DOB:1941-02-19, 83 y.o., female Today's Date: 07/02/2023    END OF SESSION:  PT End of Session - 07/01/23 1408     Visit Number 13    Number of Visits 25    Date for PT Re-Evaluation 07/07/23    Authorization Type MCR A & B    PT Start Time 1406    PT Stop Time 1448    PT Time Calculation (min) 42 min    Activity Tolerance Patient tolerated treatment well    Behavior During Therapy WFL for tasks assessed/performed                 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   Left Buccal Cheek (Keratoacanthoma)   Umbilical hernia    Past Surgical History:  Procedure Laterality Date   BOWEL RESECTION  07/09/2013   Procedure: SMALL BOWEL RESECTION;  Surgeon: Velora Heckler, MD;  Location: [facility];  Service: General;;   Brock Bad REPAIR Left 03/22/2023   Procedure: LEFT GLUTEUS MEDIUS REPAIR WITH POSSIBLE COLLAGEN PATCH AUGMENTATION;  Surgeon: Huel Cote, MD;  Location: [facility];  Service: Orthopedics;  Laterality: Left;   LAPAROTOMY N/A 07/09/2013   Procedure: EXPLORATORY LAPAROTOMY;  Surgeon: Velora Heckler, MD;  Location: [facility];  Service: General;  Laterality: N/A;   SHOULDER ARTHROSCOPY WITH CAPSULORRHAPHY     Patient Active Problem List   Diagnosis Date Noted   Skin ulcer of ankle, limited to breakdown of skin (HCC) 04/28/2023   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   Hypokalemia 12/27/2022   Volume depletion 12/24/2022   Constipation 12/24/2022   Partial small bowel obstruction (HCC) 12/24/2022   Polycythemia 12/24/2022   Gastroesophageal reflux disease 08/26/2022   Bloating 08/26/2022   Small intestinal bacterial overgrowth (SIBO) 08/26/2022    Osteoarthritis 12/01/2021   Plantar wart 12/01/2021   Ventral hernia 01/01/2021   Diverticulosis 01/05/2019   Dermatitis 01/05/2019   Systolic murmur 07/25/2018   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   Hyperglycemia 10/05/2017   Leg edema 10/05/2017   Hammer toe 10/05/2017   Hot flashes due to menopause 10/05/2017   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/10/2013 07/11/2013   Hypertension 07/09/2013    REFERRING PROVIDER: Huel Cote, MD  REFERRING DIAG: [phone] (ICD-10-CM) - Tendinopathy of gluteus medius  S/p glute med repair  THERAPY DIAG:  Pain in left hip  Muscle weakness (generalized)  Stiffness of left hip, not elsewhere classified  Difficulty in walking, not elsewhere classified  Rationale for Evaluation and Treatment: Rehabilitation  ONSET DATE: DOS 03/22/2023  Days since surgery: 101   SUBJECTIVE:   SUBJECTIVE STATEMENT: Pt is 14 weeks and 3 days s/p L hip glute med repair.  Pt denies any adverse effects after prior Rx.  Pt reports compliance with HEP.  Pt states she is hurting today.  She did some household cleaning yesterday.  Pt states her pain may be from going up and down the steps so many times while cleaning.    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    PAIN:  NPRS:  5/10 current, 5/10 worst.  Worst pain is at the end of the day.  Location: L lateral hip  PRECAUTIONS: Other: per surgical protocol, Ulcer on L heel/post ankle/achilles, lumbar anterolisthesis, double hernia    WEIGHT BEARING RESTRICTIONS: Yes WBAT  FALLS:  Has patient fallen in last 6 months? No  LIVING ENVIRONMENT: Lives with: lives with their spouse Lives in: home; lives  on 1 floor though does have to go to basement. 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  Pt was ambulating without AD.    PATIENT GOALS: to walk straight and walk normally, improve strength  NEXT MD VISIT: 05/07/2023  OBJECTIVE:  Note: Objective measures were completed at Evaluation unless otherwise noted.   DIAGNOSTIC FINDINGS: Pt is post op.      TODAY'S TREATMENT:    07/01/23 Therapeutic Exercise: Nustep lvl 4, 5 min UEs/LEs  Sidestepping with UE's on rail x 2 laps S/L hip abduction x10 and x 8 reps Stool rotations 2 x 10 reps LAQ 2#  3x10 Standing marching 2x10  Pt received L hip PROM in flexion and abduction per pt and tissue tolerance.    Therapeutic Activities: Hip hiking with Ue's on rail 2x10 bilat Attempted step ups on a 4 inch step though stopped due to pain Mini squats 2x10 with UE support on rail   06/22/23 Nustep lvl 4, 5 min UEs/LEs  Sidestepping with UE's on rail x 2 laps Hip hiking with Ue's on rail 2x10 bilat Step ups 4 inch step 2x10 Attempted lateral step ups on 4 inch step though stopped due to pain. Mini squats 2x10 with UE support on rail Standing hip abd 2x10 bilat Stool rotations 2 x 10 reps LAQ 2#  3x10   06/09/23 Nustep lvl 3, 5 min UEs/LEs  Reassessment  1/2 kneeling hip flexor stretch 3 x 20 second holds Step up 4 inch 2 x 10  Lateral step up 4 inch 2 x 10 Self STM with roller to L quads - 3 minutes  Lateral stepping 4 x 8 feet bilateral   06/03/2023 Nustep lvl 3, 5 min UEs/LEs  Cybex leg press  30# x10, 35# x10 Standing marching x10 reps, x5 reps with UE support Standing hip abd 2x10 L LE only Stool rotations 2 x 10 reps Supine bridge 2x10 LAQ 2# 3x10 Weight shifts on airex  s/s without UE support x 10 reps and f/b with UE support x 10 reps Staggered stance on airex with L LE back x 30 sec  Pt received L hip flexion, ER, and IR PROM per pt and tissue tolerance w/n protocol ranges  05/31/2023 Reviewed current function, response to prior Rx, pain levels, and HEP compliance.  Nustep lvl 3, 5 min UEs/LEs  Supine bridge 2x10 Hip abduction submax isometric- seated 5" 2x10 Standing hip abd 2x10 L LE only Stool rotations 2 x 10 reps LAQ 1# x10, 2# 2x10 Standing marching with UE support 2x10  Pt received L hip flexion, abd, ER, and IR PROM per pt and tissue tolerance w/n protocol ranges  PATIENT EDUCATION:  Education details:  PT instructed her to not be carrying or pulling bags of mulch.   dx, relevant anatomy, HEP, POC, exercise form, rationale of interventions, and post op and protocol restrictions/limitations.  06/09/23: reassessment findings Person educated: Patient Education method: Explanation, Demonstration, Tactile cues, Verbal cues, and Handouts Education comprehension: verbalized understanding, returned demonstration, verbal cues required, tactile cues required, and needs further education  HOME EXERCISE PROGRAM: Access Code: [email or web address] URL: [email or web address] Date: 04/14/2023 Prepared by: Aaron Edelman  Exercises - Supine Quadricep Sets  - 2 x daily - 7 x weekly - 2 sets - 10 reps - 5 seconds hold - Supine Posterior Pelvic Tilt  - 2 x daily - 7 x weekly - 2 sets - 10 reps  04/23/23- Hooklying Gluteal Sets  - 1 x daily - 7 x weekly - 5 reps - 10 second hold - Supine Hip Adduction Isometric with Ball  - 1 x daily - 7 x weekly - 10 reps - 10 second hold - Seated Heel Squeeze  - 1 x daily - 7 x weekly - 10 reps - 10 second hold - Seated Long Arc Quad  - 1 x daily - 7 x weekly - 10 reps - 5 second  hold  ASSESSMENT:  CLINICAL IMPRESSION: Presents to Rx with increased pain today.  She reports she did a lot of stairs while she was cleaning her home yesterday.  Pt performed exercises per protocol well with cuing and instruction in correct form.  She requires cuing for correct form with mini squats and demonstrates improved form with cuing.  Pt tolerated exercises well today except 4 inch step ups.  PT had pt stop step ups due to pain.  Pt responded well to Rx reporting no increased pain after Rx.  Pt should benefit from cont skilled PT per protocol to address impairments and goals and to assist in restoring desired level of function.      OBJECTIVE IMPAIRMENTS: Abnormal gait, decreased activity tolerance, decreased endurance, decreased mobility, difficulty walking, decreased ROM, decreased strength, hypomobility, and pain.   ACTIVITY LIMITATIONS: carrying, lifting, standing, squatting, stairs, transfers, and locomotion level  PARTICIPATION LIMITATIONS: cleaning and yard work  PERSONAL FACTORS: 3+ comorbidities: L LE ulcer, double hernia, arthritis  are also affecting patient's functional outcome.   REHAB POTENTIAL: Good  CLINICAL DECISION MAKING: Stable/uncomplicated  EVALUATION COMPLEXITY: Low   GOALS:   SHORT TERM GOALS:  Pt will be independent and compliant with HEP for improved pain, strength, and function. Baseline: Goal status: MET Target date:  05/05/2023   2.  Pt will demo improved quality of gait with increased stance time and toe off on L.   Baseline:  Goal status: INITIAL Target date:  05/12/2023  3.  Pt will progress with exercises per protocol without adverse effects for improved strength and mobility.   Baseline:  Goal status: INITIAL Target date:  05/26/2023   4.  Pt will report improved tolerance with standing activities and ambulation.  Baseline:  Goal status: INITIAL Target date:  06/16/2023   5.  Pt will ambulate with a normalized heel to toe gait  without limping.  Baseline:  Goal status: INITIAL Target date:  06/23/2023   6.  Pt will be able to perform a 6 inch step up with good form and control with L LE leading for improved performance of stairs abd and improved functional strength. Baseline:  Goal status: INITIAL Target date:  06/30/2023   LONG TERM GOALS: Target date: 07/21/2023  Pt will be able to ambulate extended community distance without significant difficulty and pain.   Baseline:  Goal status: INITIAL  2.  Pt will be able to perform her ADLs and IADLs including household chores without significant difficulty and pain. Baseline:  Goal status: INITIAL  3.  Pt will be able to take care of her husband without significant limitations.   Baseline:  Goal status: INITIAL  4.  Pt will be able to perform mini squats with good form and without increased pain in order for improved functional LE strength and to assist with returning to gardening activities.   Baseline:  Goal status: INITIAL     PLAN:  PT FREQUENCY: 1x/week x 2-3 weeks and 2x/wk afterwards  PT DURATION: 12-14 weeks  PLANNED INTERVENTIONS: 97164- PT Re-evaluation, 97110-Therapeutic exercises, 97530- Therapeutic activity, [id number]- Neuromuscular re-education, 97535- Self Care, 16109- Manual therapy, [id number]- Gait training, [phone]- Aquatic Therapy, 97014- Electrical stimulation (unattended), [phone]- Ultrasound, Patient/Family education, Balance training, Stair training, Taping, Dry Needling, Joint mobilization, Scar mobilization, Cryotherapy, and Moist heat  PLAN FOR NEXT SESSION:  Cont per Dr. Serena Croissant gluteus medius repair.  Be aware of healing ulcer in distal L LE at post heel/ankle.     Audie Clear III PT, DPT 07/02/23 2:06 PM

## 2023-07-01 NOTE — Progress Notes (Signed)
Her BUN is elevated which indicates she is she is probably dehydrated however we are still waiting on her other results.  Can we check with the lab to see when these will be resulted?  In the meantime recommend that she continue to try to get 8 glasses of water per day.

## 2023-07-02 ENCOUNTER — Encounter (HOSPITAL_BASED_OUTPATIENT_CLINIC_OR_DEPARTMENT_OTHER): Payer: Self-pay | Admitting: Physical Therapy

## 2023-07-03 ENCOUNTER — Other Ambulatory Visit: Payer: Self-pay | Admitting: Family Medicine

## 2023-07-04 NOTE — Therapy (Incomplete)
OUTPATIENT PHYSICAL THERAPY TREATMENT  Progress Note Reporting Period 06/22/2023 to 07/05/2023  See note below for Objective Data and Assessment of Progress/Goals.       Patient Name: Ellen [name] MRN: [id number] DOB:12/06/1940, 83 y.o., female Today's Date: 07/04/2023    END OF SESSION:        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   Left Buccal Cheek (Keratoacanthoma)   Umbilical hernia    Past Surgical History:  Procedure Laterality Date   BOWEL RESECTION  07/09/2013   Procedure: SMALL BOWEL RESECTION;  Surgeon: Velora Heckler, MD;  Location: [facility];  Service: General;;   Brock Bad REPAIR Left 03/22/2023   Procedure: LEFT GLUTEUS MEDIUS REPAIR WITH POSSIBLE COLLAGEN PATCH AUGMENTATION;  Surgeon: Huel Cote, MD;  Location: [facility];  Service: Orthopedics;  Laterality: Left;   LAPAROTOMY N/A 07/09/2013   Procedure: EXPLORATORY LAPAROTOMY;  Surgeon: Velora Heckler, MD;  Location: [facility];  Service: General;  Laterality: N/A;   SHOULDER ARTHROSCOPY WITH CAPSULORRHAPHY     Patient Active Problem List   Diagnosis Date Noted   Skin ulcer of ankle, limited to breakdown of skin (HCC) 04/28/2023   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   Hypokalemia 12/27/2022   Volume depletion 12/24/2022   Constipation 12/24/2022   Partial small bowel obstruction (HCC) 12/24/2022   Polycythemia 12/24/2022   Gastroesophageal reflux disease 08/26/2022   Bloating 08/26/2022   Small intestinal bacterial overgrowth (SIBO) 08/26/2022   Osteoarthritis 12/01/2021   Plantar wart 12/01/2021   Ventral hernia 01/01/2021   Diverticulosis 01/05/2019   Dermatitis 01/05/2019   Systolic murmur 07/25/2018   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   Hyperglycemia 10/05/2017   Leg edema  10/05/2017   Hammer toe 10/05/2017   Hot flashes due to menopause 10/05/2017   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/10/2013 07/11/2013   Hypertension 07/09/2013    REFERRING PROVIDER: Huel Cote, MD  REFERRING DIAG: [phone] (ICD-10-CM) - Tendinopathy of gluteus medius  S/p glute med repair  THERAPY DIAG:  No diagnosis found.  Rationale for Evaluation and Treatment: Rehabilitation  ONSET DATE: DOS 03/22/2023  Days since surgery: 105   SUBJECTIVE:   SUBJECTIVE STATEMENT: Pt is 15 weeks s/p L hip glute med repair.  Pt denies any adverse effects after prior Rx.  Pt reports compliance with HEP.  Pt states she is hurting today.  She did some household cleaning yesterday.  Pt states her pain may be from going up and down the steps so many times while cleaning.    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    PAIN:  NPRS:  5/10 current, 5/10 worst.  Worst pain is at the end of the day.  Location: L lateral hip  PRECAUTIONS: Other: per surgical protocol, Ulcer on L heel/post ankle/achilles, lumbar anterolisthesis, double hernia    WEIGHT BEARING RESTRICTIONS: Yes WBAT  FALLS:  Has patient fallen in last 6 months? No  LIVING ENVIRONMENT: Lives with: lives with their spouse Lives  in: home; lives on 1 floor though does have to go to basement. 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  Pt was ambulating without AD.     PATIENT GOALS: to walk straight and walk normally, improve strength  NEXT MD VISIT: 05/07/2023  OBJECTIVE:  Note: Objective measures were completed at Evaluation unless otherwise noted.   DIAGNOSTIC FINDINGS: Pt is post op.      TODAY'S TREATMENT:     PATIENT SURVEYS:  FOTO 55 with a goal of 73 at visit 15 06/09/23: 49% function   Hip ROM:   GAIT: Comments: Pt ambulating with SPC.  She denies pain with ambulation.  Slow gait speed.  Decreased toe off on L and increased stance time on R.   07/01/23 Therapeutic Exercise: Nustep lvl 4, 5 min UEs/LEs  Sidestepping with UE's on rail x 2 laps S/L hip abduction x10 and x 8 reps Stool rotations 2 x 10 reps LAQ 2#  3x10 Standing marching 2x10  Pt received L hip PROM in flexion and abduction per pt and tissue tolerance.    Therapeutic Activities: Hip hiking with Ue's on rail 2x10 bilat Attempted step ups on a 4 inch step though stopped due to pain Mini squats 2x10 with UE support on rail   06/22/23 Nustep lvl 4, 5 min UEs/LEs  Sidestepping with UE's on rail x 2 laps Hip hiking with Ue's on rail 2x10 bilat Step ups 4 inch step 2x10 Attempted lateral step ups on 4 inch step though stopped due to pain. Mini squats 2x10 with UE support on rail Standing hip abd 2x10 bilat Stool rotations 2 x 10 reps LAQ 2#  3x10   06/09/23 Nustep lvl 3, 5 min UEs/LEs  Reassessment  1/2 kneeling hip flexor stretch 3 x 20 second holds Step up 4 inch 2 x 10  Lateral step up 4 inch 2 x 10 Self STM with roller to L quads - 3 minutes  Lateral stepping 4 x 8 feet bilateral   06/03/2023 Nustep lvl 3, 5 min UEs/LEs  Cybex leg press  30# x10, 35# x10 Standing marching x10 reps, x5 reps with UE support Standing hip abd 2x10 L LE only Stool rotations 2 x 10 reps Supine bridge 2x10 LAQ 2# 3x10 Weight shifts on airex s/s without UE support x 10 reps and f/b with UE support x 10 reps Staggered stance on airex with L LE back x 30 sec  Pt  received L hip flexion, ER, and IR PROM per pt and tissue tolerance w/n protocol ranges  05/31/2023 Reviewed current function, response to prior Rx, pain levels, and HEP compliance.  Nustep lvl 3, 5 min UEs/LEs  Supine bridge 2x10 Hip abduction submax isometric- seated 5" 2x10 Standing hip abd 2x10 L LE only Stool rotations 2 x 10 reps LAQ 1# x10, 2# 2x10 Standing marching with UE support 2x10  Pt received L hip flexion, abd, ER, and IR PROM per pt and tissue tolerance w/n protocol ranges  PATIENT EDUCATION:  Education details:  PT instructed her to not be carrying or pulling bags of mulch.   dx, relevant anatomy, HEP, POC, exercise form, rationale of interventions, and post op and protocol restrictions/limitations.  06/09/23: reassessment findings Person educated: Patient Education method: Explanation, Demonstration, Tactile cues, Verbal cues, and Handouts Education comprehension: verbalized understanding, returned demonstration, verbal cues required, tactile cues required, and needs further education  HOME EXERCISE PROGRAM: Access Code: [email or web address] URL: [email or web address] Date: 04/14/2023 Prepared by: Aaron Edelman  Exercises - Supine Quadricep Sets  - 2 x daily - 7 x weekly - 2 sets - 10 reps - 5 seconds hold - Supine Posterior Pelvic Tilt  - 2 x daily - 7 x weekly - 2 sets - 10 reps  04/23/23- Hooklying Gluteal Sets  - 1 x daily - 7 x weekly - 5 reps - 10 second hold - Supine Hip Adduction Isometric with Ball  - 1 x daily - 7 x weekly - 10 reps - 10 second hold - Seated Heel Squeeze  - 1 x daily - 7 x weekly - 10 reps - 10 second hold - Seated Long Arc Quad  - 1 x daily - 7 x weekly - 10 reps - 5 second hold  ASSESSMENT:  CLINICAL IMPRESSION: Presents to Rx with increased pain today.  She reports she did a lot of stairs while she was  cleaning her home yesterday.  Pt performed exercises per protocol well with cuing and instruction in correct form.  She requires cuing for correct form with mini squats and demonstrates improved form with cuing.  Pt tolerated exercises well today except 4 inch step ups.  PT had pt stop step ups due to pain.  Pt responded well to Rx reporting no increased pain after Rx.  Pt should benefit from cont skilled PT per protocol to address impairments and goals and to assist in restoring desired level of function.      OBJECTIVE IMPAIRMENTS: Abnormal gait, decreased activity tolerance, decreased endurance, decreased mobility, difficulty walking, decreased ROM, decreased strength, hypomobility, and pain.   ACTIVITY LIMITATIONS: carrying, lifting, standing, squatting, stairs, transfers, and locomotion level  PARTICIPATION LIMITATIONS: cleaning and yard work  PERSONAL FACTORS: 3+ comorbidities: L LE ulcer, double hernia, arthritis  are also affecting patient's functional outcome.   REHAB POTENTIAL: Good  CLINICAL DECISION MAKING: Stable/uncomplicated  EVALUATION COMPLEXITY: Low   GOALS:   SHORT TERM GOALS:  Pt will be independent and compliant with HEP for improved pain, strength, and function. Baseline: Goal status: MET Target date:  05/05/2023   2.  Pt will demo improved quality of gait with increased stance time and toe off on L.   Baseline:  Goal status: INITIAL Target date:  05/12/2023  3.  Pt will progress with exercises per protocol without adverse effects for improved strength and mobility.   Baseline:  Goal status: INITIAL Target date:  05/26/2023   4.  Pt will report improved tolerance with standing activities and ambulation.  Baseline:  Goal status: INITIAL Target date:  06/16/2023   5.  Pt will ambulate with a normalized heel to toe gait without limping.  Baseline:  Goal status: INITIAL Target date:  06/23/2023   6.  Pt will be able to perform a 6 inch step up with good  form and control with L LE leading for improved performance of stairs abd and improved functional strength. Baseline:  Goal status: INITIAL Target date:  06/30/2023   LONG TERM GOALS: Target date: 07/21/2023  Pt will be able to ambulate extended community distance without significant difficulty and pain.   Baseline:  Goal status: INITIAL  2.  Pt will be able to perform her ADLs and IADLs including household chores without significant difficulty and pain. Baseline:  Goal status: INITIAL  3.  Pt will be able to take care of her husband without significant limitations.   Baseline:  Goal status: INITIAL  4.  Pt will be able to perform mini squats with good form and without increased pain in order for improved functional LE strength and to assist with returning to gardening activities.   Baseline:  Goal status: INITIAL     PLAN:  PT FREQUENCY: 1x/week x 2-3 weeks and 2x/wk afterwards  PT DURATION: 12-14 weeks  PLANNED INTERVENTIONS: 97164- PT Re-evaluation, 97110-Therapeutic exercises, 97530- Therapeutic activity, [id number]- Neuromuscular re-education, 97535- Self Care, 16109- Manual therapy, [id number]- Gait training, [phone]- Aquatic Therapy, 97014- Electrical stimulation (unattended), [phone]- Ultrasound, Patient/Family education, Balance training, Stair training, Taping, Dry Needling, Joint mobilization, Scar mobilization, Cryotherapy, and Moist heat  PLAN FOR NEXT SESSION:  Cont per Dr. Serena Croissant gluteus medius repair.  Be aware of healing ulcer in distal L LE at post heel/ankle.     Audie Clear III PT, DPT 07/04/23 8:30 AM

## 2023-07-05 ENCOUNTER — Ambulatory Visit (HOSPITAL_BASED_OUTPATIENT_CLINIC_OR_DEPARTMENT_OTHER): Payer: Medicare Other | Admitting: Physical Therapy

## 2023-07-08 ENCOUNTER — Encounter: Payer: Self-pay | Admitting: Family Medicine

## 2023-07-08 ENCOUNTER — Encounter (HOSPITAL_BASED_OUTPATIENT_CLINIC_OR_DEPARTMENT_OTHER): Payer: Medicare Other

## 2023-07-08 NOTE — Telephone Encounter (Signed)
Spoke with lab techs and they are checking on labs that hasn't been processed for patient

## 2023-07-08 NOTE — Telephone Encounter (Signed)
No results in patient chart

## 2023-07-09 ENCOUNTER — Other Ambulatory Visit: Payer: Self-pay | Admitting: *Deleted

## 2023-07-09 DIAGNOSIS — E860 Dehydration: Secondary | ICD-10-CM

## 2023-07-09 NOTE — Progress Notes (Signed)
Her B12 is low.  This can cause neuropathic pain and may be the source of some of her pain..  Recommend starting B12 protocol here.  This should hopefully help with her pain.  I would like for her to come back soon to recheck her kidney function as it does look like she was dehydrated.  Please place order for CMET.  The rest of her labs are stable.

## 2023-07-09 NOTE — Telephone Encounter (Signed)
I am sorry for the delay in labs - not sure what happened with the labs.  I will forward to [facility] also to see if there is anything that happened on the back end that we need to be aware of.   It does look like her B12 is low.  Recommend we start B12 protocol here per my result note.

## 2023-07-09 NOTE — Telephone Encounter (Signed)
 See results note.

## 2023-07-11 NOTE — Therapy (Incomplete)
 OUTPATIENT PHYSICAL THERAPY TREATMENT  Progress Note Reporting Period 06/22/2023 to 07/12/2023  See note below for Objective Data and Assessment of Progress/Goals.       Patient Name: Ellen [name] MRN: [id number] DOB:10-22-1940, 83 y.o., female Today's Date: 07/13/2023    END OF SESSION:  PT End of Session - 07/12/23 1409     Visit Number 14    Number of Visits 26    Date for PT Re-Evaluation 08/23/23    Authorization Type MCR A & B    PT Start Time 1318    PT Stop Time 1408    PT Time Calculation (min) 50 min    Activity Tolerance Patient tolerated treatment well    Behavior During Therapy WFL for tasks assessed/performed                  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   Left Buccal Cheek (Keratoacanthoma)   Umbilical hernia    Past Surgical History:  Procedure Laterality Date   BOWEL RESECTION  07/09/2013   Procedure: SMALL BOWEL RESECTION;  Surgeon: Velora Heckler, MD;  Location: [facility];  Service: General;;   Brock Bad REPAIR Left 03/22/2023   Procedure: LEFT GLUTEUS MEDIUS REPAIR WITH POSSIBLE COLLAGEN PATCH AUGMENTATION;  Surgeon: Huel Cote, MD;  Location: [facility];  Service: Orthopedics;  Laterality: Left;   LAPAROTOMY N/A 07/09/2013   Procedure: EXPLORATORY LAPAROTOMY;  Surgeon: Velora Heckler, MD;  Location: [facility];  Service: General;  Laterality: N/A;   SHOULDER ARTHROSCOPY WITH CAPSULORRHAPHY     Patient Active Problem List   Diagnosis Date Noted   Skin ulcer of ankle, limited to breakdown of skin (HCC) 04/28/2023   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   Hypokalemia 12/27/2022   Volume depletion 12/24/2022   Constipation 12/24/2022   Partial small bowel obstruction (HCC) 12/24/2022   Polycythemia 12/24/2022    Gastroesophageal reflux disease 08/26/2022   Bloating 08/26/2022   Small intestinal bacterial overgrowth (SIBO) 08/26/2022   Osteoarthritis 12/01/2021   Plantar wart 12/01/2021   Ventral hernia 01/01/2021   Diverticulosis 01/05/2019   Dermatitis 01/05/2019   Systolic murmur 07/25/2018   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   Hyperglycemia 10/05/2017   Leg edema 10/05/2017   Hammer toe 10/05/2017   Hot flashes due to menopause 10/05/2017   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/10/2013 07/11/2013   Hypertension 07/09/2013    REFERRING PROVIDER: Huel Cote, MD  REFERRING DIAG: [phone] (ICD-10-CM) - Tendinopathy of gluteus medius  S/p glute med repair  THERAPY DIAG:  Pain in left hip  Muscle weakness (generalized)  Stiffness of left hip, not elsewhere classified  Difficulty in walking, not elsewhere classified  Rationale for Evaluation and Treatment: Rehabilitation  ONSET DATE: DOS 03/22/2023  Days since surgery: 112   SUBJECTIVE:   SUBJECTIVE STATEMENT: Pt is 16 weeks s/p L hip glute med repair.  Pt denies any adverse effects after prior Rx.  Pt reports compliance with HEP.   FUNCTIONAL IMPROVEMENTS:  Pt reports improved strength, balance, and mobility.  Pt states doing her daily chores is about the same, maybe a little better.  Pt is not using her cane.  She only uses walker 1st thing in AM.  FUNCTIONAL LIMITATIONS:  Pt is limited with ambulation distance and has increased pain with ambulating long distance.  Pt has increased pain with walking 1/2 block.  Pt has increased pain with performing stairs.  Pt is limited with lifting.  Pt raked a hill which took 2.5 hours.  She had increased pain that evening the following day.    Pt states she is still having a lot of pain in hip.  Pt states she typically takes a tylenol at 4 pm which helps.     Pt went to her PCP and had labs done.  Pt states she was severely dehydrated and her B12 was low.  Pt has  increased her water intake.  Pt is taking a B12 shot once per week x 4 weeks.  Pt states the ulcers in her distal LE's (above her heel) have not healed and is being followed by MD.  Pt has distal bilat LE pain that interferes with her sleep.      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    PAIN:  NPRS:  0/10 current, 8/10 worst.  Worst pain is 1st thing in AM.  Location: L lateral hip  PRECAUTIONS: Other: per surgical protocol, Ulcer on L heel/post ankle/achilles, lumbar anterolisthesis, double hernia    WEIGHT BEARING RESTRICTIONS: Yes WBAT  FALLS:  Has patient fallen in last 6 months? No  LIVING ENVIRONMENT: Lives with: lives with their spouse Lives in: home; lives on 1 floor though does have to go to basement. 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  Pt was ambulating without AD.    PATIENT GOALS: to walk straight and walk normally, improve strength  NEXT MD VISIT: 05/07/2023  OBJECTIVE:  Note: Objective measures were completed at Evaluation unless otherwise noted.   DIAGNOSTIC FINDINGS: Pt is post op.      TODAY'S TREATMENT:     2/24  PATIENT SURVEYS:  FOTO Initial / Prior / Current:  36 / 49 /47 with a goal of 73 at visit 15.   Hip AROM: Flex:  107 deg Abd:  18 deg ER:  24 deg IR:  30 deg  Strength: Pt able to perform S/L hip abduction independently Hip flexion:  R:  4+/5, L:  4/5    GAIT: Comments:  Pt ambulating  without AD.  Pt leans to R.  She favors L LE having increased Wb'ing thru R LE. Improved gait speed.   Pt performed: Sidestepping x 2 laps and lateral band walks with YTB around thighs proximal to knee x 1 lap at rail with UE's on rail Step ups on 4 inch step x 4 reps--stopped due to 8/10 pain Hip hiking 2x10 bilat with UE support on rail    07/01/23 Therapeutic Exercise: Nustep lvl 4, 5 min UEs/LEs  Sidestepping with UE's on rail x 2 laps S/L hip abduction x10 and x 8 reps Stool rotations 2 x 10 reps LAQ 2#  3x10 Standing marching 2x10  Pt received L hip PROM in  flexion and abduction per pt and tissue tolerance.    Therapeutic Activities: Hip hiking with Ue's on rail 2x10 bilat Attempted step ups on a 4 inch step though stopped due to pain Mini squats 2x10 with UE support on rail   06/22/23 Nustep lvl 4, 5 min UEs/LEs  Sidestepping with UE's on rail x 2 laps Hip hiking with Ue's on rail 2x10 bilat Step ups 4 inch step 2x10 Attempted lateral step ups on 4 inch step though stopped due to pain. Mini squats 2x10 with UE support on rail Standing hip abd 2x10 bilat Stool rotations 2 x 10 reps LAQ 2#  3x10   06/09/23 Nustep lvl 3, 5 min UEs/LEs  Reassessment  1/2 kneeling hip flexor stretch 3 x 20 second holds Step up 4 inch 2 x 10  Lateral step up 4 inch 2 x 10 Self STM with roller to L quads - 3 minutes  Lateral stepping 4 x 8 feet bilateral      PATIENT EDUCATION:  Education details:  dx, relevant anatomy, HEP, POC, exercise form, rationale of interventions, and post op and protocol restrictions/limitations.  06/09/23: reassessment findings Person educated: Patient Education method: Explanation, Demonstration, Tactile cues, Verbal cues, and Handouts Education comprehension: verbalized understanding, returned demonstration, verbal cues required, tactile cues required, and needs further education  HOME EXERCISE PROGRAM: Access Code: [email or web address] URL:  [email or web address] Date: 04/14/2023 Prepared by: Aaron Edelman  Exercises - Supine Quadricep Sets  - 2 x daily - 7 x weekly - 2 sets - 10 reps - 5 seconds hold - Supine Posterior Pelvic Tilt  - 2 x daily - 7 x weekly - 2 sets - 10 reps  04/23/23- Hooklying Gluteal Sets  - 1 x daily - 7 x weekly - 5 reps - 10 second hold - Supine Hip Adduction Isometric with Ball  - 1 x daily - 7 x weekly - 10 reps - 10 second hold - Seated Heel Squeeze  - 1 x daily - 7 x weekly - 10 reps - 10 second hold - Seated Long Arc Quad  - 1 x daily - 7 x weekly - 10 reps - 5 second hold  ASSESSMENT:  CLINICAL IMPRESSION: PT completed PN today.  Pt is progressing well with protocol, hip ROM, strength, and mobility.  Pt reports she is limited with functional mobility due to hip pain including ambulation distance.  Pt has improved gait though does continue to favor L LE and have increased Wb'ing thru R LE.  Pt is not using an AD except her walker 1st thing in AM.  Pt has increased pain with performing stairs and has regressed with step ups.  She has significant pain with 4 inch step ups and Pt stopped step ups due to pain.  Pt has been performing yard work.  Pt is improving with strength t/o LE including being able to perform S/L hip abduction independently.  Pt performs exercises per protocol in the clinic with good tolerance and is progressing with protocol.  Her FOTO score has decreased indicating worse self perceived disability.  Pt has met STG's #1-4 and LTG #3.  Pt should benefit from cont skilled PT per protocol to address impairments and goals and to assist in restoring desired level of function.      It is of note that pt had labs done which showed dehydration and low B12.  Pt is in the process of working on those levels.    OBJECTIVE IMPAIRMENTS: Abnormal gait, decreased activity tolerance,  decreased endurance, decreased mobility, difficulty walking, decreased ROM, decreased strength, hypomobility,  and pain.   ACTIVITY LIMITATIONS: carrying, lifting, standing, squatting, stairs, transfers, and locomotion level  PARTICIPATION LIMITATIONS: cleaning and yard work  PERSONAL FACTORS: 3+ comorbidities: L LE ulcer, double hernia, arthritis  are also affecting patient's functional outcome.   REHAB POTENTIAL: Good  CLINICAL DECISION MAKING: Stable/uncomplicated  EVALUATION COMPLEXITY: Low   GOALS:   SHORT TERM GOALS:  Pt will be independent and compliant with HEP for improved pain, strength, and function. Baseline: Goal status: MET Target date:  05/05/2023   2.  Pt will demo improved quality of gait with increased stance time and toe off on L.   Baseline:  Goal status:  GOAL MET  07/12/23 Target date:  05/12/2023  3.  Pt will progress with exercises per protocol without adverse effects for improved strength and mobility.   Baseline:  Goal status: GOAL MET  07/12/23 Target date:  05/26/2023   4.  Pt will report improved tolerance with standing activities and ambulation.  Baseline:  Goal status:  GOAL MET  07/12/23 Target date:  06/16/2023   5.  Pt will ambulate with a normalized heel to toe gait without limping.  Baseline:  Goal status:  PROGRESSING  07/12/23 Target date:  06/23/2023   6.  Pt will be able to perform a 6 inch step up with good form and control with L LE leading for improved performance of stairs abd and improved functional strength. Baseline:  Goal status: NOT MET  07/12/23 Target date:  06/30/2023   LONG TERM GOALS: Target date: 08/23/2023   Pt will be able to ambulate extended community distance without significant difficulty and pain.   Baseline:  Goal status: ONGOING   2.  Pt will be able to perform her ADLs and IADLs including household chores without significant difficulty and pain. Baseline:  Goal status: ONGOING  3.  Pt will be able to take care of her husband without significant limitations.   Baseline:  Goal status:  GOAL MET  07/12/23  4.   Pt will be able to perform mini squats with good form and without increased pain in order for improved functional LE strength and to assist with returning to gardening activities.   Baseline:  Goal status: PROGRESSING      PLAN:  PT FREQUENCY:  2x/wk  PT DURATION: 6 weeks  PLANNED INTERVENTIONS: 97164- PT Re-evaluation, 97110-Therapeutic exercises, 97530- Therapeutic activity, [id number]- Neuromuscular re-education, 97535- Self Care, 82956- Manual therapy, [id number]- Gait training, [phone]- Aquatic Therapy, 97014- Electrical stimulation (unattended), [phone]- Ultrasound, Patient/Family education, Balance training, Stair training, Taping, Dry Needling, Joint mobilization, Scar mobilization, Cryotherapy, and Moist heat  PLAN FOR NEXT SESSION:  Cont per Dr. Serena Croissant gluteus medius repair.  Be aware of healing ulcer in distal L LE at post heel/ankle.     Audie Clear III PT, DPT 07/13/23 8:28 AM

## 2023-07-12 ENCOUNTER — Ambulatory Visit (HOSPITAL_BASED_OUTPATIENT_CLINIC_OR_DEPARTMENT_OTHER): Payer: Medicare Other | Admitting: Physical Therapy

## 2023-07-12 ENCOUNTER — Encounter (HOSPITAL_BASED_OUTPATIENT_CLINIC_OR_DEPARTMENT_OTHER): Payer: Self-pay | Admitting: Physical Therapy

## 2023-07-12 DIAGNOSIS — M25552 Pain in left hip: Secondary | ICD-10-CM

## 2023-07-12 DIAGNOSIS — M25652 Stiffness of left hip, not elsewhere classified: Secondary | ICD-10-CM

## 2023-07-12 DIAGNOSIS — M6281 Muscle weakness (generalized): Secondary | ICD-10-CM | POA: Diagnosis not present

## 2023-07-12 DIAGNOSIS — R262 Difficulty in walking, not elsewhere classified: Secondary | ICD-10-CM | POA: Diagnosis not present

## 2023-07-14 ENCOUNTER — Ambulatory Visit (INDEPENDENT_AMBULATORY_CARE_PROVIDER_SITE_OTHER): Payer: Medicare Other | Admitting: *Deleted

## 2023-07-14 ENCOUNTER — Other Ambulatory Visit: Payer: Medicare Other

## 2023-07-14 DIAGNOSIS — E538 Deficiency of other specified B group vitamins: Secondary | ICD-10-CM | POA: Diagnosis not present

## 2023-07-14 DIAGNOSIS — E860 Dehydration: Secondary | ICD-10-CM

## 2023-07-14 LAB — COMPREHENSIVE METABOLIC PANEL
ALT: 16 U/L (ref 0–35)
AST: 16 U/L (ref 0–37)
Albumin: 3.5 g/dL (ref 3.5–5.2)
Alkaline Phosphatase: 65 U/L (ref 39–117)
BUN: 27 mg/dL — ABNORMAL HIGH (ref 6–23)
CO2: 31 meq/L (ref 19–32)
Calcium: 9.1 mg/dL (ref 8.4–10.5)
Chloride: 102 meq/L (ref 96–112)
Creatinine, Ser: 0.64 mg/dL (ref 0.40–1.20)
GFR: 81.2 mL/min (ref >60.00)
Glucose, Bld: 104 mg/dL — ABNORMAL HIGH (ref 70–99)
Potassium: 3.6 meq/L (ref 3.5–5.1)
Sodium: 141 meq/L (ref 135–145)
Total Bilirubin: 0.5 mg/dL (ref 0.2–1.2)
Total Protein: 5.9 g/dL — ABNORMAL LOW (ref 6.0–8.3)

## 2023-07-14 MED ORDER — CYANOCOBALAMIN 1000 MCG/ML IJ SOLN
1000.0000 ug | Freq: Once | INTRAMUSCULAR | Status: AC
  Administered 2023-07-14: 1000 ug via INTRAMUSCULAR

## 2023-07-14 NOTE — Progress Notes (Signed)
 Patient is in office today for a nurse visit for B12 Injection, per PCP's order. Patient Injection was given in the  Left deltoid. Patient tolerated injection well. Next B12 injection scheduled for 07/22/23.

## 2023-07-15 ENCOUNTER — Ambulatory Visit (HOSPITAL_BASED_OUTPATIENT_CLINIC_OR_DEPARTMENT_OTHER): Payer: Medicare Other | Admitting: Physical Therapy

## 2023-07-15 ENCOUNTER — Encounter: Payer: Self-pay | Admitting: Family Medicine

## 2023-07-15 ENCOUNTER — Encounter (HOSPITAL_BASED_OUTPATIENT_CLINIC_OR_DEPARTMENT_OTHER): Payer: Self-pay | Admitting: Physical Therapy

## 2023-07-15 DIAGNOSIS — M6281 Muscle weakness (generalized): Secondary | ICD-10-CM | POA: Diagnosis not present

## 2023-07-15 DIAGNOSIS — M25552 Pain in left hip: Secondary | ICD-10-CM

## 2023-07-15 DIAGNOSIS — M25652 Stiffness of left hip, not elsewhere classified: Secondary | ICD-10-CM | POA: Diagnosis not present

## 2023-07-15 DIAGNOSIS — R262 Difficulty in walking, not elsewhere classified: Secondary | ICD-10-CM

## 2023-07-15 NOTE — Therapy (Signed)
 OUTPATIENT PHYSICAL THERAPY TREATMENT         Patient Name: Ellen [name] MRN: [id number] DOB:May 31, 1940, 83 y.o., female Today's Date: 07/15/2023    END OF SESSION:  PT End of Session - 07/15/23 1022     Visit Number 15    Number of Visits 26    Date for PT Re-Evaluation 08/23/23    Authorization Type MCR A & B    PT Start Time 1019    PT Stop Time 1101    PT Time Calculation (min) 42 min    Activity Tolerance Patient tolerated treatment well    Behavior During Therapy WFL for tasks assessed/performed                   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   Left Buccal Cheek (Keratoacanthoma)   Umbilical hernia    Past Surgical History:  Procedure Laterality Date   BOWEL RESECTION  07/09/2013   Procedure: SMALL BOWEL RESECTION;  Surgeon: Velora Heckler, MD;  Location: [facility];  Service: General;;   Brock Bad REPAIR Left 03/22/2023   Procedure: LEFT GLUTEUS MEDIUS REPAIR WITH POSSIBLE COLLAGEN PATCH AUGMENTATION;  Surgeon: Huel Cote, MD;  Location: [facility];  Service: Orthopedics;  Laterality: Left;   LAPAROTOMY N/A 07/09/2013   Procedure: EXPLORATORY LAPAROTOMY;  Surgeon: Velora Heckler, MD;  Location: [facility];  Service: General;  Laterality: N/A;   SHOULDER ARTHROSCOPY WITH CAPSULORRHAPHY     Patient Active Problem List   Diagnosis Date Noted   Skin ulcer of ankle, limited to breakdown of skin (HCC) 04/28/2023   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   Hypokalemia 12/27/2022   Volume depletion 12/24/2022   Constipation 12/24/2022   Partial small bowel obstruction (HCC) 12/24/2022   Polycythemia 12/24/2022   Gastroesophageal reflux disease 08/26/2022   Bloating 08/26/2022   Small intestinal bacterial overgrowth (SIBO)  08/26/2022   Osteoarthritis 12/01/2021   Plantar wart 12/01/2021   Ventral hernia 01/01/2021   Diverticulosis 01/05/2019   Dermatitis 01/05/2019   Systolic murmur 07/25/2018   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   Hyperglycemia 10/05/2017   Leg edema 10/05/2017   Hammer toe 10/05/2017   Hot flashes due to menopause 10/05/2017   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/10/2013 07/11/2013   Hypertension 07/09/2013    REFERRING PROVIDER: Huel Cote, MD  REFERRING DIAG: [phone] (ICD-10-CM) - Tendinopathy of gluteus medius  S/p glute med repair  THERAPY DIAG:  Pain in left hip  Muscle weakness (generalized)  Stiffness of left hip, not elsewhere classified  Difficulty in walking, not elsewhere classified  Rationale for Evaluation and Treatment: Rehabilitation  ONSET DATE: DOS 03/22/2023  Days since surgery: 115   SUBJECTIVE:   SUBJECTIVE STATEMENT: Pt is 16 weeks and 3 days s/p L hip glute med repair.  Pt states she had increased pain after prior Rx which required the rest of the day to level out.  Pt reports compliance with HEP.  Pt states she had her 1st vitamin B12 shot.  Pt raked her yard yesterday and had increased pain.  She took a tylenol which helped.  Pt is not using her cane.  She only uses walker 1st thing in AM.  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    PAIN:  NPRS:  3/10 current, 8/10 worst.  Worst pain is 1st thing in AM.  Location: L anterior hip  PRECAUTIONS: Other: per surgical protocol, Ulcer on L heel/post ankle/achilles, lumbar anterolisthesis, double hernia    WEIGHT BEARING RESTRICTIONS: Yes WBAT  FALLS:  Has patient fallen in last 6  months? No  LIVING ENVIRONMENT: Lives with: lives with their spouse Lives in: home; lives on 1 floor though does have to go to basement. 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  Pt was ambulating without AD.    PATIENT GOALS: to walk straight and walk normally, improve strength  NEXT MD VISIT: 05/07/2023  OBJECTIVE:  Note: Objective measures were completed at Evaluation unless otherwise noted.   DIAGNOSTIC FINDINGS: Pt is post op.      TODAY'S TREATMENT:     2/27 Nustep L4 UE/LE's x 5 mins Hip hiking 2x10 bilat Lateral band walks with YTB above knees x 2 laps at rail with UE support Standing hip abd x10 bilat, YTB above knees x 10 reps bilat Step downs on 4 inch step 2x10 Step ups on 2 inch step 2x10 Standing marching with UE support 2x10 LAQ 2# x 10, 3# 2x10  PT updated HEP and gave pt a HEP handout.  PT educated pt in correct form and appropriate frequency.  PT instructed pt she should not have pain with HEP.    2/24  PATIENT SURVEYS:  FOTO Initial / Prior / Current:  30 / 49 /47 with a goal of 73 at visit 15.   Hip AROM: Flex:  107 deg Abd:  18 deg ER:  24 deg IR:  30 deg  Strength: Pt able to perform S/L hip abduction independently Hip flexion:  R:  4+/5, L:  4/5    GAIT: Comments:  Pt ambulating without AD.  Pt leans to R.  She favors L LE having increased Wb'ing thru R LE. Improved gait speed.   Pt performed: Sidestepping x 2 laps and lateral band walks with YTB around thighs proximal to knee x 1 lap at rail with UE's on rail Step ups on 4 inch step x 4 reps--stopped due to 8/10 pain Hip hiking 2x10 bilat with UE support on rail    07/01/23 Therapeutic Exercise: Nustep lvl 4, 5 min UEs/LEs   Sidestepping with UE's on rail x 2 laps S/L hip abduction x10 and x 8 reps Stool rotations 2 x 10 reps LAQ 2#  3x10 Standing marching 2x10  Pt received L hip PROM in flexion and abduction per pt and tissue tolerance.    Therapeutic Activities: Hip hiking with Ue's on rail 2x10 bilat Attempted step ups on a 4 inch step though stopped due to pain Mini squats 2x10 with UE support on rail   06/22/23 Nustep lvl 4, 5 min UEs/LEs  Sidestepping with UE's on rail x 2 laps Hip hiking with Ue's on rail 2x10 bilat Step ups 4 inch  step 2x10 Attempted lateral step ups on 4 inch step though stopped due to pain. Mini squats 2x10 with UE support on rail Standing hip abd 2x10 bilat Stool rotations 2 x 10 reps LAQ 2#  3x10      PATIENT EDUCATION:  Education details:  dx, relevant anatomy, HEP, POC, exercise form, rationale of interventions, and post op and protocol restrictions/limitations.  06/09/23: reassessment findings Person educated: Patient Education method: Explanation, Demonstration, Tactile cues, Verbal cues, and Handouts Education comprehension: verbalized understanding, returned demonstration, verbal cues required, tactile cues required, and needs further education  HOME EXERCISE PROGRAM: Access Code: [email or web address] URL: [email or web address] Date: 04/14/2023 Prepared by: Aaron Edelman  Updated HEP: - Side Stepping with Counter Support  - 1 x daily - 5-6 x weekly - 2 sets - 10 reps - Standing Hip Hiking  - 1 x daily - 5 x weekly - 2 sets - 10 reps - Standing Hip Abduction with Counter Support  - 1 x daily - 5 x weekly - 2 sets - 10 reps - Standing March with Counter Support  - 1 x daily - 6-7 x weekly - 2 sets - 10 reps  ASSESSMENT:  CLINICAL IMPRESSION: PT worked on glute med/LE strengthening and exercises to improve performance of functional mobility.  Pt has been having pain with 4 inch step ups.  PT decreased height of step ups to 2 inches and also had pt perform  step downs from a 4 inch step to work on improving performance of stairs and improving strength at a tolerable level.  She had good tolerance with step ups at a lower height and step downs.  Pt performed exercises well with cuing and instruction in correct form and positioning.  She had good tolerance with treatment.  PT updated HEP and gave pt a HEP handout.  PT educated pt concerning HEP and pt demonstrates good understanding of HEP.  She responded well to Rx having no change in pain after Rx.  Pt should benefit from cont skilled PT per protocol to address impairments and goals and to assist in restoring desired level of function.        OBJECTIVE IMPAIRMENTS: Abnormal gait, decreased activity tolerance, decreased endurance, decreased mobility, difficulty walking, decreased ROM, decreased strength, hypomobility, and pain.   ACTIVITY LIMITATIONS: carrying, lifting, standing, squatting, stairs, transfers, and locomotion level  PARTICIPATION LIMITATIONS: cleaning and yard work  PERSONAL FACTORS: 3+ comorbidities: L LE ulcer, double hernia, arthritis  are also affecting patient's functional outcome.   REHAB POTENTIAL: Good  CLINICAL DECISION MAKING: Stable/uncomplicated  EVALUATION COMPLEXITY: Low   GOALS:   SHORT TERM GOALS:  Pt will be independent and compliant with HEP for improved pain, strength, and function. Baseline: Goal status: MET Target date:  05/05/2023   2.  Pt will demo improved quality of gait with increased stance time and toe off on L.   Baseline:  Goal status:  GOAL MET  07/12/23 Target date:  05/12/2023  3.  Pt will progress with exercises per protocol without adverse effects for improved strength and mobility.   Baseline:  Goal status: GOAL MET  07/12/23 Target date:  05/26/2023   4.  Pt will report improved tolerance with standing activities and ambulation.  Baseline:  Goal status:  GOAL MET  07/12/23 Target date:  06/16/2023   5.  Pt will ambulate with a  normalized heel to toe gait without limping.  Baseline:  Goal status:  PROGRESSING  07/12/23 Target date:  06/23/2023   6.  Pt will be able to perform a 6 inch step up with good form and control with L LE leading for improved performance of stairs abd and improved functional strength. Baseline:  Goal status: NOT MET  07/12/23 Target date:  06/30/2023   LONG TERM GOALS: Target date: 08/23/2023   Pt will be able to ambulate extended community distance without significant difficulty and pain.   Baseline:  Goal status: ONGOING   2.  Pt will be able to perform her ADLs and IADLs including household chores without significant difficulty and pain. Baseline:  Goal status: ONGOING  3.  Pt will be able to take care of her husband without significant limitations.   Baseline:  Goal status:  GOAL MET  07/12/23  4.  Pt will be able to perform mini squats with good form and without increased pain in order for improved functional LE strength and to assist with returning to gardening activities.   Baseline:  Goal status: PROGRESSING      PLAN:  PT FREQUENCY:  2x/wk  PT DURATION: 6 weeks  PLANNED INTERVENTIONS: 97164- PT Re-evaluation, 97110-Therapeutic exercises, 97530- Therapeutic activity, [id number]- Neuromuscular re-education, 97535- Self Care, 62130- Manual therapy, [id number]- Gait training, [phone]- Aquatic Therapy, 97014- Electrical stimulation (unattended), [phone]- Ultrasound, Patient/Family education, Balance training, Stair training, Taping, Dry Needling, Joint mobilization, Scar mobilization, Cryotherapy, and Moist heat  PLAN FOR NEXT SESSION:  Cont per Dr. Serena Croissant gluteus medius repair.  Be aware of healing ulcer in distal L LE at post heel/ankle.     Audie Clear III PT, DPT 07/15/23 11:47 AM

## 2023-07-15 NOTE — Progress Notes (Signed)
 Kidney numbers are improving.  She should continue to make sure that she is getting plenty of fluids and staying hydrated and we can recheck at her next office visit.

## 2023-07-21 ENCOUNTER — Encounter (HOSPITAL_BASED_OUTPATIENT_CLINIC_OR_DEPARTMENT_OTHER): Payer: Self-pay | Admitting: Physical Therapy

## 2023-07-21 ENCOUNTER — Ambulatory Visit: Payer: Medicare Other

## 2023-07-21 ENCOUNTER — Ambulatory Visit (HOSPITAL_BASED_OUTPATIENT_CLINIC_OR_DEPARTMENT_OTHER): Payer: Medicare Other | Attending: Orthopaedic Surgery | Admitting: Physical Therapy

## 2023-07-21 DIAGNOSIS — R262 Difficulty in walking, not elsewhere classified: Secondary | ICD-10-CM | POA: Insufficient documentation

## 2023-07-21 DIAGNOSIS — M25652 Stiffness of left hip, not elsewhere classified: Secondary | ICD-10-CM | POA: Insufficient documentation

## 2023-07-21 DIAGNOSIS — M25552 Pain in left hip: Secondary | ICD-10-CM | POA: Insufficient documentation

## 2023-07-21 DIAGNOSIS — M6281 Muscle weakness (generalized): Secondary | ICD-10-CM | POA: Diagnosis not present

## 2023-07-21 NOTE — Therapy (Signed)
 OUTPATIENT PHYSICAL THERAPY TREATMENT         Patient Name: Ellen [name] MRN: [id number] DOB:11-Feb-1941, 83 y.o., female Today's Date: 07/21/2023    END OF SESSION:  PT End of Session - 07/21/23 1305     Visit Number 16    Number of Visits 26    Date for PT Re-Evaluation 08/23/23    Authorization Type MCR A & B    PT Start Time 1305    PT Stop Time 1345    PT Time Calculation (min) 40 min    Activity Tolerance Patient tolerated treatment well    Behavior During Therapy WFL for tasks assessed/performed                   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   Left Buccal Cheek (Keratoacanthoma)   Umbilical hernia    Past Surgical History:  Procedure Laterality Date   BOWEL RESECTION  07/09/2013   Procedure: SMALL BOWEL RESECTION;  Surgeon: Velora Heckler, MD;  Location: [facility];  Service: General;;   Brock Bad REPAIR Left 03/22/2023   Procedure: LEFT GLUTEUS MEDIUS REPAIR WITH POSSIBLE COLLAGEN PATCH AUGMENTATION;  Surgeon: Huel Cote, MD;  Location: [facility];  Service: Orthopedics;  Laterality: Left;   LAPAROTOMY N/A 07/09/2013   Procedure: EXPLORATORY LAPAROTOMY;  Surgeon: Velora Heckler, MD;  Location: [facility];  Service: General;  Laterality: N/A;   SHOULDER ARTHROSCOPY WITH CAPSULORRHAPHY     Patient Active Problem List   Diagnosis Date Noted   Skin ulcer of ankle, limited to breakdown of skin (HCC) 04/28/2023   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   Hypokalemia 12/27/2022   Volume depletion 12/24/2022   Constipation 12/24/2022   Partial small bowel obstruction (HCC) 12/24/2022   Polycythemia 12/24/2022   Gastroesophageal reflux disease 08/26/2022   Bloating 08/26/2022   Small intestinal bacterial overgrowth (SIBO)  08/26/2022   Osteoarthritis 12/01/2021   Plantar wart 12/01/2021   Ventral hernia 01/01/2021   Diverticulosis 01/05/2019   Dermatitis 01/05/2019   Systolic murmur 07/25/2018   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   Hyperglycemia 10/05/2017   Leg edema 10/05/2017   Hammer toe 10/05/2017   Hot flashes due to menopause 10/05/2017   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/10/2013 07/11/2013   Hypertension 07/09/2013    REFERRING PROVIDER: Huel Cote, MD  REFERRING DIAG: [id number] (ICD-10-CM) - Tendinopathy of gluteus medius  S/p glute med repair  THERAPY DIAG:  Pain in left hip  Muscle weakness (generalized)  Stiffness of left hip, not elsewhere classified  Difficulty in walking, not elsewhere classified  Rationale for Evaluation and Treatment: Rehabilitation  ONSET DATE: DOS 03/22/2023  Days since surgery: 121   SUBJECTIVE:   SUBJECTIVE STATEMENT: Patient states HEP going well. Still pain in front of hip. Hip pain in the front with walking causing her to hunch over.      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    PAIN:  NPRS:  3/10  current, 8/10 worst.  Worst pain is 1st thing in AM.  Location: L anterior hip  PRECAUTIONS: Other: per surgical protocol, Ulcer on L heel/post ankle/achilles, lumbar anterolisthesis, double hernia    WEIGHT BEARING RESTRICTIONS: Yes WBAT  FALLS:  Has patient fallen in last 6 months? No  LIVING ENVIRONMENT: Lives with: lives with their spouse Lives in: home; lives on 1 floor though does have to go to basement. Stairs: 15 stairs to get to basement Has following equipment at home: Single point cane, Walker - 2 wheeled, Wheelchair  (manual), and rollator  OCCUPATION:  Pt is semi-retired.  Pt is a Human resources officer.    PLOF: Independent  Pt performed a walking program and would walk 1-1.5 hours daily.  Pt was ambulating without AD.    PATIENT GOALS: to walk straight and walk normally, improve strength  NEXT MD VISIT: 05/07/2023  OBJECTIVE:  Note: Objective measures were completed at Evaluation unless otherwise noted.   DIAGNOSTIC FINDINGS: Pt is post op.      TODAY'S TREATMENT:    07/21/23 Nustep L4 UE/LE's x 5 mins Manual: STM to hip flexors/ rectus femoris, quads Forward step up 4 inch 1 x 10 Lateral step up 2 x 10  Standing hip extension RTB at knees 2 x 10  Tall kneeling hip hinge 1 x 10  2/27 Nustep L4 UE/LE's x 5 mins Hip hiking 2x10 bilat Lateral band walks with YTB above knees x 2 laps at rail with UE support Standing hip abd x10 bilat, YTB above knees x 10 reps bilat Step downs on 4 inch step 2x10 Step ups on 2 inch step 2x10 Standing marching with UE support 2x10 LAQ 2# x 10, 3# 2x10  PT updated HEP and gave pt a HEP handout.  PT educated pt in correct form and appropriate frequency.  PT instructed pt she should not have pain with HEP.    2/24  PATIENT SURVEYS:  FOTO Initial / Prior / Current:  28 / 49 /47 with a goal of 73 at visit 15.   Hip AROM: Flex:  107 deg Abd:  18 deg ER:  24 deg IR:  30 deg  Strength: Pt able to perform S/L hip abduction independently Hip flexion:  R:  4+/5, L:  4/5    GAIT: Comments:  Pt ambulating without AD.  Pt leans to R.  She favors L LE having increased Wb'ing thru R LE. Improved gait speed.   Pt performed: Sidestepping x 2 laps and lateral band walks with YTB around thighs proximal to knee x 1 lap at rail with UE's on rail Step ups on 4 inch step x 4 reps--stopped due to 8/10 pain Hip hiking 2x10 bilat with UE support on rail    07/01/23 Therapeutic Exercise: Nustep lvl 4, 5 min UEs/LEs  Sidestepping with UE's on rail x 2 laps S/L  hip abduction x10 and x 8 reps Stool rotations 2 x 10 reps LAQ 2#  3x10 Standing marching 2x10  Pt received L hip PROM in flexion and abduction per pt and tissue tolerance.    Therapeutic Activities: Hip hiking with Ue's on rail 2x10 bilat Attempted step ups on a 4 inch step though stopped due to pain Mini squats 2x10 with UE support on rail   06/22/23 Nustep lvl 4, 5 min UEs/LEs  Sidestepping with UE's on rail x 2 laps Hip hiking with Ue's on rail 2x10 bilat Step ups 4 inch step 2x10 Attempted lateral step ups on 4 inch  step though stopped due to pain. Mini squats 2x10 with UE support on rail Standing hip abd 2x10 bilat Stool rotations 2 x 10 reps LAQ 2#  3x10      PATIENT EDUCATION:  Education details:  dx, relevant anatomy, HEP, POC, exercise form, rationale of interventions, and post op and protocol restrictions/limitations.  06/09/23: reassessment findings Person educated: Patient Education method: Explanation, Demonstration, Tactile cues, Verbal cues, and Handouts Education comprehension: verbalized understanding, returned demonstration, verbal cues required, tactile cues required, and needs further education  HOME EXERCISE PROGRAM: Access Code: [email or web address] URL: [email or web address] Date: 04/14/2023 Prepared by: Aaron Edelman  Updated HEP: - Side Stepping with Counter Support  - 1 x daily - 5-6 x weekly - 2 sets - 10 reps - Standing Hip Hiking  - 1 x daily - 5 x weekly - 2 sets - 10 reps - Standing Hip Abduction with Counter Support  - 1 x daily - 5 x weekly - 2 sets - 10 reps - Standing March with Counter Support  - 1 x daily - 6-7 x weekly - 2 sets - 10 reps  ASSESSMENT:  CLINICAL IMPRESSION: Patient with continued c/o hip flexor/ rectus femoris symptoms. Performed manual due to hyperactive and tender hip flexors/quads with minimal change in symptoms. Would likely benefit from DN and patient to consider it following education on the intervention.  Continued with functional and glute strength. Patient will continue to benefit from physical therapy in order to improve function and reduce impairment.     OBJECTIVE IMPAIRMENTS: Abnormal gait, decreased activity tolerance, decreased endurance, decreased mobility, difficulty walking, decreased ROM, decreased strength, hypomobility, and pain.   ACTIVITY LIMITATIONS: carrying, lifting, standing, squatting, stairs, transfers, and locomotion level  PARTICIPATION LIMITATIONS: cleaning and yard work  PERSONAL FACTORS: 3+ comorbidities: L LE ulcer, double hernia, arthritis  are also affecting patient's functional outcome.   REHAB POTENTIAL: Good  CLINICAL DECISION MAKING: Stable/uncomplicated  EVALUATION COMPLEXITY: Low   GOALS:   SHORT TERM GOALS:  Pt will be independent and compliant with HEP for improved pain, strength, and function. Baseline: Goal status: MET Target date:  05/05/2023   2.  Pt will demo improved quality of gait with increased stance time and toe off on L.   Baseline:  Goal status:  GOAL MET  07/12/23 Target date:  05/12/2023  3.  Pt will progress with exercises per protocol without adverse effects for improved strength and mobility.   Baseline:  Goal status: GOAL MET  07/12/23 Target date:  05/26/2023   4.  Pt will report improved tolerance with standing activities and ambulation.  Baseline:  Goal status:  GOAL MET  07/12/23 Target date:  06/16/2023   5.  Pt will ambulate with a normalized heel to toe gait without limping.  Baseline:  Goal status:  PROGRESSING  07/12/23 Target date:  06/23/2023   6.  Pt will be able to perform a 6 inch step up with good form and control with L LE leading for improved performance of stairs abd and improved functional strength. Baseline:  Goal status: NOT MET  07/12/23 Target date:  06/30/2023   LONG TERM GOALS: Target date: 08/23/2023   Pt will be able to ambulate extended community distance without significant difficulty  and pain.   Baseline:  Goal status: ONGOING   2.  Pt will be able to perform her ADLs and IADLs including household chores without significant difficulty and pain. Baseline:  Goal status: ONGOING  3.  Pt will be able to  take care of her husband without significant limitations.   Baseline:  Goal status:  GOAL MET  07/12/23  4.  Pt will be able to perform mini squats with good form and without increased pain in order for improved functional LE strength and to assist with returning to gardening activities.   Baseline:  Goal status: PROGRESSING      PLAN:  PT FREQUENCY:  2x/wk  PT DURATION: 6 weeks  PLANNED INTERVENTIONS: 97164- PT Re-evaluation, 97110-Therapeutic exercises, 97530- Therapeutic activity, [id number]- Neuromuscular re-education, 97535- Self Care, 16109- Manual therapy, [id number]- Gait training, [id number]- Aquatic Therapy, 97014- Electrical stimulation (unattended), [phone]- Ultrasound, Patient/Family education, Balance training, Stair training, Taping, Dry Needling, Joint mobilization, Scar mobilization, Cryotherapy, and Moist heat  PLAN FOR NEXT SESSION:  Cont per Dr. Serena Croissant gluteus medius repair.  Be aware of healing ulcer in distal L LE at post heel/ankle.      Reola Mosher Torre [place], PT 07/21/2023, 1:08 PM

## 2023-07-22 ENCOUNTER — Ambulatory Visit: Payer: Medicare Other

## 2023-07-22 ENCOUNTER — Ambulatory Visit (INDEPENDENT_AMBULATORY_CARE_PROVIDER_SITE_OTHER)

## 2023-07-22 VITALS — Ht 61.0 in | Wt 114.0 lb

## 2023-07-22 DIAGNOSIS — Z0000 Encounter for general adult medical examination without abnormal findings: Secondary | ICD-10-CM | POA: Diagnosis not present

## 2023-07-22 DIAGNOSIS — E538 Deficiency of other specified B group vitamins: Secondary | ICD-10-CM

## 2023-07-22 MED ORDER — CYANOCOBALAMIN 1000 MCG/ML IJ SOLN
1000.0000 ug | Freq: Once | INTRAMUSCULAR | Status: AC
  Administered 2023-07-28: 1000 ug via INTRAMUSCULAR

## 2023-07-22 NOTE — Patient Instructions (Signed)
 Ms. Ellen [name] , Thank you for taking time to come for your Medicare Wellness Visit. I appreciate your ongoing commitment to your health goals. Please review the following plan we discussed and let me know if I can assist you in the future.   Referrals/Orders/Follow-Ups/Clinician Recommendations: continue to work toward walking a mile a day with strengthening exercise and PT   This is a list of the screening recommended for you and due dates:  Health Maintenance  Topic Date Due   COVID-19 Vaccine (5 - 2024-25 season) 01/17/2023   Flu Shot  08/16/2023*   Zoster (Shingles) Vaccine (1 of 2) 08/24/2023*   Pneumonia Vaccine (1 of 1 - PCV) 01/07/2024*   DTaP/Tdap/Td vaccine (2 - Td or Tdap) 05/25/2024*   Mammogram  10/29/2023   Medicare Annual Wellness Visit  07/21/2024   DEXA scan (bone density measurement)  Completed   HPV Vaccine  Aged Out  *Topic was postponed. The date shown is not the original due date.    Advanced directives: (In Chart) A copy of your advanced directives are scanned into your chart should your provider ever need it.  Next Medicare Annual Wellness Visit scheduled for next year: Yes

## 2023-07-22 NOTE — Progress Notes (Signed)
 Patient was given the b12 injection in the left arm today. Patient tolerated injection well. Donzetta Starch, CMA

## 2023-07-22 NOTE — Progress Notes (Signed)
 Subjective:   Ellen [name] is a 83 y.o. who presents for a Medicare Wellness preventive visit.  Visit Complete: Virtual I connected with  Ellen [name] on 07/22/23 by a audio enabled telemedicine application and verified that I am speaking with the correct person using two identifiers.  Patient Location: Home  Provider Location: Office/Clinic  I discussed the limitations of evaluation and management by telemedicine. The patient expressed understanding and agreed to proceed.  Vital Signs: Because this visit was a virtual/telehealth visit, some criteria may be missing or patient reported. Any vitals not documented were not able to be obtained and vitals that have been documented are patient reported.  VideoDeclined- This patient declined Librarian, academic. Therefore the visit was completed with audio only.  AWV Questionnaire: No: Patient Medicare AWV questionnaire was not completed prior to this visit.  Cardiac Risk Factors include: advanced age (>11men, >65 women);hypertension     Objective:    Today's Vitals   07/22/23 1355 07/22/23 1356  Weight: 114 lb (51.7 kg)   Height: 5\' 1"  (1.549 m)   PainSc:  6    Body mass index is 21.54 kg/m.     07/22/2023    2:03 PM 04/14/2023    2:15 PM 03/22/2023    9:59 AM 12/25/2022    7:29 AM 12/24/2022   10:27 AM 07/16/2022    1:51 PM 02/16/2022    8:46 AM  Advanced Directives  Does Patient Have a Medical Advance Directive? Yes Yes Yes  No Yes No  Type of Estate agent of [place];Living will Healthcare Power of [place];Living will Healthcare Power of [employer] of [place];Living will   Does patient want to make changes to medical advance directive? No - Patient declined  No - Patient declined   No - Patient declined   Copy of Healthcare Power of Attorney in Chart? Yes - validated most recent copy scanned in chart (See row information)  No - copy requested   Yes -  validated most recent copy scanned in chart (See row information)   Would patient like information on creating a medical advance directive?    No - Patient declined   No - Patient declined    Current Medications (verified) Outpatient Encounter Medications as of 07/22/2023  Medication Sig   Calcium Carbonate Antacid (TUMS PO) Take 1 tablet by mouth as needed (reflux).   Carboxymethylcellulose Sodium (EYE DROPS OP) Place 2 drops into both eyes daily. Thera Tears   furosemide (LASIX) 40 MG tablet Take 1 tablet (40 mg total) by mouth daily.   hydrochlorothiazide (MICROZIDE) 12.5 MG capsule TAKE 1 CAPSULE(12.5 MG) BY MOUTH DAILY   losartan (COZAAR) 50 MG tablet TAKE 1 TABLET(50 MG) BY MOUTH DAILY   mupirocin ointment (BACTROBAN) 2 % Apply 1 Application topically 2 (two) times daily.   neomycin-bacitracin-polymyxin [phone] OINT Apply 1 Application topically in the morning and at bedtime.   pantoprazole (PROTONIX) 40 MG tablet TAKE 1 TABLET(40 MG) BY MOUTH DAILY   PREMPRO 0.45-1.5 MG tablet TAKE 1 TABLET BY MOUTH DAILY   No facility-administered encounter medications on file as of 07/22/2023.    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   Left Buccal Cheek (Keratoacanthoma)   Umbilical hernia    Past Surgical History:  Procedure Laterality Date   BOWEL RESECTION  07/09/2013   Procedure: SMALL BOWEL RESECTION;  Surgeon: Velora Heckler, MD;  Location: [facility];  Service: General;;   Brock Bad REPAIR Left 03/22/2023   Procedure: LEFT GLUTEUS MEDIUS REPAIR WITH POSSIBLE COLLAGEN PATCH AUGMENTATION;  Surgeon: Huel Cote, MD;  Location: [facility];  Service: Orthopedics;  Laterality: Left;   LAPAROTOMY N/A  07/09/2013   Procedure: EXPLORATORY LAPAROTOMY;  Surgeon: Velora Heckler, MD;  Location: [facility];  Service: General;  Laterality: N/A;   SHOULDER ARTHROSCOPY WITH CAPSULORRHAPHY     Family History  Problem Relation Age of Onset   Diabetes Mother    Heart attack Mother    CAD Father    Heart attack Father    Breast cancer Neg Hx    Colon cancer Neg Hx    Esophageal cancer Neg Hx    Rectal cancer Neg Hx    Stomach cancer Neg Hx    Social History   Socioeconomic History   Marital status: Married    Spouse name: Not on file   Number of children: 0   Years of education: Not on file   Highest education level: Not on file  Occupational History   Occupation: Retired   Tobacco Use   Smoking status: Former    Types: Cigarettes   Smokeless tobacco: Never  Vaping Use   Vaping status: Never Used  Substance and Sexual Activity   Alcohol use: Yes    Alcohol/week: 7.0 standard drinks of alcohol    Types: 7 Glasses of wine per week    Comment: nightly glass of wine   Drug use: No   Sexual activity: Not Currently  Other Topics Concern   Not on file  Social History Narrative   Enjoys gardening    Social Drivers of Health   Financial Resource Strain: Low Risk  (07/22/2023)   Overall Financial Resource Strain (CARDIA)    Difficulty of Paying Living Expenses: Not hard at all  Food Insecurity: No Food Insecurity (07/22/2023)   Hunger Vital Sign    Worried About Running Out of Food in the Last Year: Never true    Ran Out of Food in the Last Year: Never true  Transportation Needs: No Transportation Needs (07/22/2023)   PRAPARE - Administrator, Civil Service (Medical): No    Lack of Transportation (Non-Medical): No  Physical Activity: Sufficiently Active (07/22/2023)   Exercise Vital Sign    Days of Exercise per Week: 5 days    Minutes of Exercise per Session: 120 min  Stress: No Stress Concern Present (07/22/2023)   [employer] of Occupational Health - Occupational Stress  Questionnaire    Feeling of Stress : Not at all  Social Connections: Moderately Integrated (07/22/2023)   Social Connection and Isolation Panel [NHANES]    Frequency of Communication with Friends and Family: Once a week    Frequency of Social Gatherings with Friends and Family: More than three times a week    Attends Religious Services: More than 4 times per year    Active Member of [employer] or Organizations: No    Attends Banker Meetings: Never    Marital Status: Married    Tobacco Counseling Counseling given: Not Answered    Clinical Intake:  Pre-visit preparation completed: Yes  Pain : 0-10 Pain  Score: 6  Pain Location: Hip Pain Orientation: Left Pain Descriptors / Indicators: Aching Pain Onset: More than a month ago Pain Frequency: Intermittent     BMI - recorded: 21.54 Nutritional Status: BMI of 19-24  Normal Diabetes: No  How often do you need to have someone help you when you read instructions, pamphlets, or other written materials from your doctor or pharmacy?: 1 - Never  Interpreter Needed?: No  Information entered by :: Lanier Ensign , LPN   Activities of Daily Living     07/22/2023    1:58 PM 03/22/2023   10:03 AM  In your present state of health, do you have any difficulty performing the following activities:  Hearing? 0 0  Vision? 0 0  Difficulty concentrating or making decisions? 0 0  Walking or climbing stairs? 1   Comment at times   Dressing or bathing? 0   Doing errands, shopping? 0   Preparing Food and eating ? N   Using the Toilet? N   In the past six months, have you accidently leaked urine? N   Do you have problems with loss of bowel control? N   Managing your Medications? N   Managing your Finances? N   Housekeeping or managing your Housekeeping? N     Patient Care Team: Ardith Dark, MD as PCP - General (Family Medicine) Glyn Ade, PA-C as Physician Assistant (Dermatology) Erroll Luna, [facility] (Inactive)  as Pharmacist (Pharmacist)  Indicate any recent Medical Services you may have received from other than [facility] providers in the past year (date may be approximate).     Assessment:   This is a routine wellness examination for Ellen [name].  Hearing/Vision screen Hearing Screening - Comments:: Pt denies any hearing issues Vision Screening - Comments:: Pt follows up with [facility] for annaul eye exams    Goals Addressed             This Visit's Progress    Patient Stated       Walk a mile a day        Depression Screen     07/22/2023    2:02 PM 06/28/2023   10:51 AM 06/10/2023    9:38 AM 05/26/2023    8:30 AM 04/28/2023    7:25 AM 03/08/2023    2:09 PM 02/04/2023    8:10 AM  PHQ 2/9 Scores  PHQ - 2 Score 0 0 0 0 0 0 0    Fall Risk     07/22/2023    2:04 PM 06/28/2023   10:51 AM 06/10/2023    9:38 AM 05/26/2023    8:30 AM 04/28/2023    7:25 AM  Fall Risk   Falls in the past year? 0 0 0 0 0  Number falls in past yr: 0 0 0 0 0  Injury with Fall? 0 0 0 0 0  Risk for fall due to : Impaired balance/gait;Impaired mobility No Fall Risks No Fall Risks No Fall Risks No Fall Risks  Follow up Falls prevention discussed        MEDICARE RISK AT HOME:  Medicare Risk at Home Any stairs in or around the home?: Yes If so, are there any without handrails?: No Home free of loose throw rugs in walkways, pet beds, electrical cords, etc?: Yes Adequate lighting in your home to reduce risk of falls?: Yes Life alert?: No Use of a cane, walker or w/c?: Yes Grab bars in the bathroom?: Yes Shower chair  or bench in shower?: Yes Elevated toilet seat or a handicapped toilet?: Yes  TIMED UP AND GO:  Was the test performed?  No  Cognitive Function: 6CIT completed        07/22/2023    2:04 PM 07/16/2022    1:53 PM 07/03/2021    2:46 PM 06/27/2020    3:31 PM  6CIT Screen  What Year? 0 points 0 points 0 points 0 points  What month? 0 points 0 points 0 points 0 points  What time? 0 points 0  points 0 points   Count back from 20 0 points 0 points 0 points 0 points  Months in reverse 0 points 0 points 0 points 0 points  Repeat phrase 0 points 0 points 2 points 0 points  Total Score 0 points 0 points 2 points     Immunizations Immunization History  Administered Date(s) Administered   Fluad Quad(high Dose 65+) 02/17/2019, 06/27/2020, 02/19/2021, 04/01/2022   Influenza, High Dose Seasonal PF 07/25/2018   PFIZER(Purple Top)SARS-COV-2 Vaccination 07/28/2019, 08/18/2019, 04/22/2020   Pfizer Covid-19 Vaccine Bivalent Booster 33yrs & up 05/23/2021   Tdap 11/04/2012    Screening Tests Health Maintenance  Topic Date Due   COVID-19 Vaccine (5 - 2024-25 season) 01/17/2023   INFLUENZA VACCINE  08/16/2023 (Originally 12/17/2022)   Zoster Vaccines- Shingrix (1 of 2) 08/24/2023 (Originally 03/11/1960)   Pneumonia Vaccine 17+ Years old (1 of 1 - PCV) 01/07/2024 (Originally 03/11/2004)   DTaP/Tdap/Td (2 - Td or Tdap) 05/25/2024 (Originally 11/05/2022)   MAMMOGRAM  10/29/2023   Medicare Annual Wellness (AWV)  07/21/2024   DEXA SCAN  Completed   HPV VACCINES  Aged Out    Health Maintenance  Health Maintenance Due  Topic Date Due   COVID-19 Vaccine (5 - 2024-25 season) 01/17/2023   Health Maintenance Items Addressed: See Nurse Notes  Additional Screening:  Vision Screening: Recommended annual ophthalmology exams for early detection of glaucoma and other disorders of the eye.  Dental Screening: Recommended annual dental exams for proper oral hygiene  Community Resource Referral / Chronic Care Management: CRR required this visit?  No   CCM required this visit?  No     Plan:     I have personally reviewed and noted the following in the patient's chart:   Medical and social history Use of alcohol, tobacco or illicit drugs  Current medications and supplements including opioid prescriptions. Patient is not currently taking opioid prescriptions. Functional ability and  status Nutritional status Physical activity Advanced directives List of other physicians Hospitalizations, surgeries, and ER visits in previous 12 months Vitals Screenings to include cognitive, depression, and falls Referrals and appointments  In addition, I have reviewed and discussed with patient certain preventive protocols, quality metrics, and best practice recommendations. A written personalized care plan for preventive services as well as general preventive health recommendations were provided to patient.     Marzella Schlein, LPN   08/19/100   After Visit Summary: (MyChart) Due to this being a telephonic visit, the after visit summary with patients personalized plan was offered to patient via MyChart   Notes: Nothing significant to report at this time.

## 2023-07-23 ENCOUNTER — Ambulatory Visit (HOSPITAL_BASED_OUTPATIENT_CLINIC_OR_DEPARTMENT_OTHER): Payer: Medicare Other

## 2023-07-23 ENCOUNTER — Encounter (HOSPITAL_BASED_OUTPATIENT_CLINIC_OR_DEPARTMENT_OTHER): Payer: Self-pay

## 2023-07-23 DIAGNOSIS — M25652 Stiffness of left hip, not elsewhere classified: Secondary | ICD-10-CM | POA: Diagnosis not present

## 2023-07-23 DIAGNOSIS — M25552 Pain in left hip: Secondary | ICD-10-CM | POA: Diagnosis not present

## 2023-07-23 DIAGNOSIS — R262 Difficulty in walking, not elsewhere classified: Secondary | ICD-10-CM

## 2023-07-23 DIAGNOSIS — M6281 Muscle weakness (generalized): Secondary | ICD-10-CM | POA: Diagnosis not present

## 2023-07-23 NOTE — Therapy (Signed)
 OUTPATIENT PHYSICAL THERAPY TREATMENT         Patient Name: Ellen [name] MRN: [id number] DOB:23-Jun-1940, 83 y.o., female Today's Date: 07/23/2023    END OF SESSION:  PT End of Session - 07/23/23 0936     Visit Number 17    Number of Visits 26    Date for PT Re-Evaluation 08/23/23    Authorization Type MCR A & B    Progress Note Due on Visit 20    PT Start Time 0930    PT Stop Time 1015    PT Time Calculation (min) 45 min    Activity Tolerance Patient tolerated treatment well    Behavior During Therapy WFL for tasks assessed/performed                    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   Left Buccal Cheek (Keratoacanthoma)   Umbilical hernia    Past Surgical History:  Procedure Laterality Date   BOWEL RESECTION  07/09/2013   Procedure: SMALL BOWEL RESECTION;  Surgeon: Velora Heckler, MD;  Location: [facility];  Service: General;;   Brock Bad REPAIR Left 03/22/2023   Procedure: LEFT GLUTEUS MEDIUS REPAIR WITH POSSIBLE COLLAGEN PATCH AUGMENTATION;  Surgeon: Huel Cote, MD;  Location: [facility];  Service: Orthopedics;  Laterality: Left;   LAPAROTOMY N/A 07/09/2013   Procedure: EXPLORATORY LAPAROTOMY;  Surgeon: Velora Heckler, MD;  Location: [facility];  Service: General;  Laterality: N/A;   SHOULDER ARTHROSCOPY WITH CAPSULORRHAPHY     Patient Active Problem List   Diagnosis Date Noted   Skin ulcer of ankle, limited to breakdown of skin (HCC) 04/28/2023   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   Hypokalemia 12/27/2022   Volume depletion 12/24/2022   Constipation 12/24/2022   Partial small bowel obstruction (HCC) 12/24/2022   Polycythemia 12/24/2022   Gastroesophageal reflux disease 08/26/2022   Bloating 08/26/2022   Small intestinal  bacterial overgrowth (SIBO) 08/26/2022   Osteoarthritis 12/01/2021   Plantar wart 12/01/2021   Ventral hernia 01/01/2021   Diverticulosis 01/05/2019   Dermatitis 01/05/2019   Systolic murmur 07/25/2018   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   Hyperglycemia 10/05/2017   Leg edema 10/05/2017   Hammer toe 10/05/2017   Hot flashes due to menopause 10/05/2017   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/10/2013 07/11/2013   Hypertension 07/09/2013    REFERRING PROVIDER: Huel Cote, MD  REFERRING DIAG: [phone] (ICD-10-CM) - Tendinopathy of gluteus medius  S/p glute med repair  THERAPY DIAG:  Pain in left hip  Muscle weakness (generalized)  Stiffness of left hip, not elsewhere classified  Difficulty in walking, not elsewhere classified  Rationale for Evaluation and Treatment: Rehabilitation  ONSET DATE: DOS 03/22/2023  Days since surgery: 123   SUBJECTIVE:   SUBJECTIVE STATEMENT: Patient states HEP going well. Still pain in front of hip. Hip pain in the front with walking causing her to hunch over.      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    PAIN:  NPRS:  3/10 current, 8/10 worst.  Worst pain is 1st thing in AM.  Location: L anterior hip  PRECAUTIONS: Other: per surgical protocol, Ulcer on L heel/post ankle/achilles, lumbar anterolisthesis, double hernia    WEIGHT BEARING RESTRICTIONS: Yes WBAT  FALLS:  Has patient fallen in last 6 months? No  LIVING ENVIRONMENT: Lives with: lives with their spouse Lives in: home; lives on 1 floor though does have to go to basement. Stairs: 15 stairs to get to basement Has following equipment at home: Single point cane,  Walker - 2 wheeled, Wheelchair (manual), and rollator  OCCUPATION:  Pt is semi-retired.  Pt is a Human resources officer.    PLOF: Independent  Pt performed a walking program and would walk 1-1.5 hours daily.  Pt was ambulating without AD.    PATIENT GOALS: to walk straight and walk normally, improve strength  NEXT MD VISIT: 05/07/2023  OBJECTIVE:  Note: Objective measures were completed at Evaluation unless otherwise noted.   DIAGNOSTIC FINDINGS: Pt is post op.      TODAY'S TREATMENT:     07/23/23 Nustep L4 UE/LE's x Manual: STM to hip flexors/ rectus femoris, quads Thomas stretch Passive hip strengthning for adductors Sidelying hip abduction 3x10L  Standing hip stretches for adductors and flexors Forward step up 4 inch 2 x 10 Standing hip extension with knee flexed x20  Tall kneeling hip hinge 1 x 10 Gait in hall  07/21/23 Nustep L4 UE/LE's x 5 mins Manual: STM to hip flexors/ rectus femoris, quads Forward step up 4 inch 1 x 10 Lateral step up 2 x 10  Standing hip extension RTB at knees 2 x 10  Tall kneeling hip hinge 1 x 10  2/27 Nustep L4 UE/LE's x 5 mins Hip hiking 2x10 bilat Lateral band walks with YTB above knees x 2 laps at rail with UE support Standing hip abd x10 bilat, YTB above knees x 10 reps bilat Step downs on 4 inch step 2x10 Step ups on 2 inch step 2x10 Standing marching with UE support 2x10 LAQ 2# x 10, 3# 2x10  PT updated HEP and gave pt a HEP handout.  PT educated pt in correct form and appropriate frequency.  PT instructed pt she should not have pain with HEP.       PATIENT EDUCATION:  Education details:  dx, relevant anatomy, HEP, POC, exercise form, rationale of interventions, and post op and protocol restrictions/limitations.  06/09/23: reassessment findings Person educated: Patient Education method: Explanation, Demonstration, Tactile cues, Verbal cues, and Handouts Education comprehension: verbalized understanding, returned  demonstration, verbal cues required, tactile cues required, and needs further education  HOME EXERCISE PROGRAM: Access Code: [email or web address] URL: [email or web address] Date: 04/14/2023 Prepared by: Aaron Edelman  Updated HEP: - Side Stepping with Counter Support  - 1 x daily - 5-6 x weekly - 2 sets - 10 reps - Standing Hip Hiking  - 1 x daily - 5 x weekly - 2 sets - 10 reps - Standing Hip Abduction with Counter Support  - 1 x daily - 5 x weekly - 2 sets - 10 reps - Standing March with Counter Support  - 1 x daily - 6-7 x weekly - 2 sets - 10 reps  ASSESSMENT:  CLINICAL IMPRESSION: Answered questions and educated pt on DN. Performed STM to adductors and hip flexors. Pt remains very tender here. Tactile cues required with s/l abduction for proper pelvic alignment. Able to compelte step ups at 4" step  today without complaint.  Improved gait following session with pt reported decreased leaning and increased step length .    OBJECTIVE IMPAIRMENTS: Abnormal gait, decreased activity tolerance, decreased endurance, decreased mobility, difficulty walking, decreased ROM, decreased strength, hypomobility, and pain.   ACTIVITY LIMITATIONS: carrying, lifting, standing, squatting, stairs, transfers, and locomotion level  PARTICIPATION LIMITATIONS: cleaning and yard work  PERSONAL FACTORS: 3+ comorbidities: L LE ulcer, double hernia, arthritis  are also affecting patient's functional outcome.   REHAB POTENTIAL: Good  CLINICAL DECISION MAKING: Stable/uncomplicated  EVALUATION COMPLEXITY: Low   GOALS:   SHORT TERM GOALS:  Pt will be independent and compliant with HEP for improved pain, strength, and function. Baseline: Goal status: MET Target date:  05/05/2023   2.  Pt will demo improved quality of gait with increased stance time and toe off on L.   Baseline:  Goal status:  GOAL MET  07/12/23 Target date:  05/12/2023  3.  Pt will progress with exercises per protocol without  adverse effects for improved strength and mobility.   Baseline:  Goal status: GOAL MET  07/12/23 Target date:  05/26/2023   4.  Pt will report improved tolerance with standing activities and ambulation.  Baseline:  Goal status:  GOAL MET  07/12/23 Target date:  06/16/2023   5.  Pt will ambulate with a normalized heel to toe gait without limping.  Baseline:  Goal status:  PROGRESSING  07/12/23 Target date:  06/23/2023   6.  Pt will be able to perform a 6 inch step up with good form and control with L LE leading for improved performance of stairs abd and improved functional strength. Baseline:  Goal status: NOT MET  07/12/23 Target date:  06/30/2023   LONG TERM GOALS: Target date: 08/23/2023   Pt will be able to ambulate extended community distance without significant difficulty and pain.   Baseline:  Goal status: ONGOING   2.  Pt will be able to perform her ADLs and IADLs including household chores without significant difficulty and pain. Baseline:  Goal status: ONGOING  3.  Pt will be able to take care of her husband without significant limitations.   Baseline:  Goal status:  GOAL MET  07/12/23  4.  Pt will be able to perform mini squats with good form and without increased pain in order for improved functional LE strength and to assist with returning to gardening activities.   Baseline:  Goal status: PROGRESSING      PLAN:  PT FREQUENCY:  2x/wk  PT DURATION: 6 weeks  PLANNED INTERVENTIONS: 97164- PT Re-evaluation, 97110-Therapeutic exercises, 97530- Therapeutic activity, [id number]- Neuromuscular re-education, 97535- Self Care, 96045- Manual therapy, [id number]- Gait training, [phone]- Aquatic Therapy, 97014- Electrical stimulation (unattended), [phone]- Ultrasound, Patient/Family education, Balance training, Stair training, Taping, Dry Needling, Joint mobilization, Scar mobilization, Cryotherapy, and Moist heat  PLAN FOR NEXT SESSION:  Cont per Dr. Serena Croissant gluteus medius repair.  Be  aware of healing ulcer in distal L LE at post heel/ankle.      Donnel Saxon Riggins Cisek, PTA 07/23/2023, 11:16 AM

## 2023-07-27 ENCOUNTER — Ambulatory Visit (HOSPITAL_BASED_OUTPATIENT_CLINIC_OR_DEPARTMENT_OTHER): Payer: Medicare Other | Admitting: Physical Therapy

## 2023-07-27 ENCOUNTER — Encounter (HOSPITAL_BASED_OUTPATIENT_CLINIC_OR_DEPARTMENT_OTHER): Payer: Self-pay | Admitting: Physical Therapy

## 2023-07-27 DIAGNOSIS — M25552 Pain in left hip: Secondary | ICD-10-CM

## 2023-07-27 DIAGNOSIS — M6281 Muscle weakness (generalized): Secondary | ICD-10-CM

## 2023-07-27 DIAGNOSIS — R262 Difficulty in walking, not elsewhere classified: Secondary | ICD-10-CM

## 2023-07-27 DIAGNOSIS — M25652 Stiffness of left hip, not elsewhere classified: Secondary | ICD-10-CM

## 2023-07-27 NOTE — Therapy (Signed)
 OUTPATIENT PHYSICAL THERAPY TREATMENT         Patient Name: Ellen [name] MRN: [id number] DOB:[birth year], 83 y.o., female Today's Date: 07/27/2023    END OF SESSION:  PT End of Session - 07/27/23 1306     Visit Number 18    Number of Visits 26    Date for PT Re-Evaluation 08/23/23    Authorization Type MCR A & B    Progress Note Due on Visit 20    PT Start Time 1301    PT Stop Time 1345    PT Time Calculation (min) 44 min    Activity Tolerance Patient tolerated treatment well    Behavior During Therapy WFL for tasks assessed/performed                    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   Left Buccal Cheek (Keratoacanthoma)   Umbilical hernia    Past Surgical History:  Procedure Laterality Date   BOWEL RESECTION  07/09/2013   Procedure: SMALL BOWEL RESECTION;  Surgeon: Velora Heckler, MD;  Location: [facility];  Service: General;;   Brock Bad REPAIR Left 03/22/2023   Procedure: LEFT GLUTEUS MEDIUS REPAIR WITH POSSIBLE COLLAGEN PATCH AUGMENTATION;  Surgeon: Huel Cote, MD;  Location: [facility];  Service: Orthopedics;  Laterality: Left;   LAPAROTOMY N/A 07/09/2013   Procedure: EXPLORATORY LAPAROTOMY;  Surgeon: Velora Heckler, MD;  Location: [facility];  Service: General;  Laterality: N/A;   SHOULDER ARTHROSCOPY WITH CAPSULORRHAPHY     Patient Active Problem List   Diagnosis Date Noted   Skin ulcer of ankle, limited to breakdown of skin (HCC) 04/28/2023   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   Hypokalemia 12/27/2022   Volume depletion 12/24/2022   Constipation 12/24/2022   Partial small bowel obstruction (HCC) 12/24/2022   Polycythemia 12/24/2022   Gastroesophageal reflux disease 08/26/2022   Bloating 08/26/2022   Small intestinal  bacterial overgrowth (SIBO) 08/26/2022   Osteoarthritis 12/01/2021   Plantar wart 12/01/2021   Ventral hernia 01/01/2021   Diverticulosis 01/05/2019   Dermatitis 01/05/2019   Systolic murmur 07/25/2018   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   Hyperglycemia 10/05/2017   Leg edema 10/05/2017   Hammer toe 10/05/2017   Hot flashes due to menopause 10/05/2017   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/10/2013 07/11/2013   Hypertension 07/09/2013    REFERRING PROVIDER: Huel Cote, MD  REFERRING DIAG: [phone] (ICD-10-CM) - Tendinopathy of gluteus medius  S/p glute med repair  THERAPY DIAG:  Pain in left hip  Muscle weakness (generalized)  Stiffness of left hip, not elsewhere classified  Difficulty in walking, not elsewhere classified  Rationale for Evaluation and Treatment: Rehabilitation  ONSET DATE: DOS 03/22/2023  Days since surgery: 127   SUBJECTIVE:   SUBJECTIVE STATEMENT: Patient states hip is sore. Ready for this to feel better.      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    PAIN:  NPRS:  3/10 current, 8/10  worst.  Worst pain is 1st thing in AM.  Location: L anterior hip  PRECAUTIONS: Other: per surgical protocol, Ulcer on L heel/post ankle/achilles, lumbar anterolisthesis, double hernia    WEIGHT BEARING RESTRICTIONS: Yes WBAT  FALLS:  Has patient fallen in last 6 months? No  LIVING ENVIRONMENT: Lives with: lives with their spouse Lives in: home; lives on 1 floor though does have to go to basement. Stairs: 15 stairs to get to basement Has following equipment at home: Single point cane, Walker - 2 wheeled, Wheelchair (manual), and  rollator  OCCUPATION:  Pt is semi-retired.  Pt is a Human resources officer.    PLOF: Independent  Pt performed a walking program and would walk 1-1.5 hours daily.  Pt was ambulating without AD.    PATIENT GOALS: to walk straight and walk normally, improve strength  NEXT MD VISIT: 05/07/2023  OBJECTIVE:  Note: Objective measures were completed at Evaluation unless otherwise noted.   DIAGNOSTIC FINDINGS: Pt is post op.      TODAY'S TREATMENT:    07/27/23 Nustep L5 UE/LE's x Manual: STM to hip flexors/ rectus femoris, quads Sidelying hip abduction 2 x 10, circles 2 x 10  Forward step up 4 inch 2 x 10 Lateral step up 4 inch 2 x 10 Standing hip extension with knee flexed x20 Tall kneeling hip hinge 2 x 10  07/23/23 Nustep L4 UE/LE's x Manual: STM to hip flexors/ rectus femoris, quads Thomas stretch Passive hip strengthning for adductors Sidelying hip abduction 3x10L  Standing hip stretches for adductors and flexors Forward step up 4 inch 2 x 10 Standing hip extension with knee flexed x20  Tall kneeling hip hinge 1 x 10 Gait in hall  07/21/23 Nustep L4 UE/LE's x 5 mins Manual: STM to hip flexors/ rectus femoris, quads Forward step up 4 inch 1 x 10 Lateral step up 2 x 10  Standing hip extension RTB at knees 2 x 10  Tall kneeling hip hinge 1 x 10  2/27 Nustep L4 UE/LE's x 5 mins Hip hiking 2x10 bilat Lateral band walks with YTB above knees x 2 laps at rail with UE support Standing hip abd x10 bilat, YTB above knees x 10 reps bilat Step downs on 4 inch step 2x10 Step ups on 2 inch step 2x10 Standing marching with UE support 2x10 LAQ 2# x 10, 3# 2x10  PT updated HEP and gave pt a HEP handout.  PT educated pt in correct form and appropriate frequency.  PT instructed pt she should not have pain with HEP.       PATIENT EDUCATION:  Education details:  dx, relevant anatomy, HEP, POC, exercise form, rationale of interventions, and post op and protocol  restrictions/limitations.  06/09/23: reassessment findings Person educated: Patient Education method: Explanation, Demonstration, Tactile cues, Verbal cues, and Handouts Education comprehension: verbalized understanding, returned demonstration, verbal cues required, tactile cues required, and needs further education  HOME EXERCISE PROGRAM: Access Code: [email or web address] URL: [email or web address] Date: 04/14/2023 Prepared by: Aaron Edelman  Updated HEP: - Side Stepping with Counter Support  - 1 x daily - 5-6 x weekly - 2 sets - 10 reps - Standing Hip Hiking  - 1 x daily - 5 x weekly - 2 sets - 10 reps - Standing Hip Abduction with Counter Support  - 1 x daily - 5 x weekly - 2 sets - 10 reps - Standing March with Counter Support  - 1 x daily - 6-7 x weekly - 2 sets - 10  reps  ASSESSMENT:  CLINICAL IMPRESSION: Patient with continued symptoms anterior hip with slight decrease in tissue tension with manual. Continued with glute and functional strengthening which is tolerated well. Patient will continue to benefit from physical therapy in order to improve function and reduce impairment.     OBJECTIVE IMPAIRMENTS: Abnormal gait, decreased activity tolerance, decreased endurance, decreased mobility, difficulty walking, decreased ROM, decreased strength, hypomobility, and pain.   ACTIVITY LIMITATIONS: carrying, lifting, standing, squatting, stairs, transfers, and locomotion level  PARTICIPATION LIMITATIONS: cleaning and yard work  PERSONAL FACTORS: 3+ comorbidities: L LE ulcer, double hernia, arthritis  are also affecting patient's functional outcome.   REHAB POTENTIAL: Good  CLINICAL DECISION MAKING: Stable/uncomplicated  EVALUATION COMPLEXITY: Low   GOALS:   SHORT TERM GOALS:  Pt will be independent and compliant with HEP for improved pain, strength, and function. Baseline: Goal status: MET Target date:  05/05/2023   2.  Pt will demo improved quality of gait with  increased stance time and toe off on L.   Baseline:  Goal status:  GOAL MET  07/12/23 Target date:  05/12/2023  3.  Pt will progress with exercises per protocol without adverse effects for improved strength and mobility.   Baseline:  Goal status: GOAL MET  07/12/23 Target date:  05/26/2023   4.  Pt will report improved tolerance with standing activities and ambulation.  Baseline:  Goal status:  GOAL MET  07/12/23 Target date:  06/16/2023   5.  Pt will ambulate with a normalized heel to toe gait without limping.  Baseline:  Goal status:  PROGRESSING  07/12/23 Target date:  06/23/2023   6.  Pt will be able to perform a 6 inch step up with good form and control with L LE leading for improved performance of stairs abd and improved functional strength. Baseline:  Goal status: NOT MET  07/12/23 Target date:  06/30/2023   LONG TERM GOALS: Target date: 08/23/2023   Pt will be able to ambulate extended community distance without significant difficulty and pain.   Baseline:  Goal status: ONGOING   2.  Pt will be able to perform her ADLs and IADLs including household chores without significant difficulty and pain. Baseline:  Goal status: ONGOING  3.  Pt will be able to take care of her husband without significant limitations.   Baseline:  Goal status:  GOAL MET  07/12/23  4.  Pt will be able to perform mini squats with good form and without increased pain in order for improved functional LE strength and to assist with returning to gardening activities.   Baseline:  Goal status: PROGRESSING      PLAN:  PT FREQUENCY:  2x/wk  PT DURATION: 6 weeks  PLANNED INTERVENTIONS: 97164- PT Re-evaluation, 97110-Therapeutic exercises, 97530- Therapeutic activity, [id number]- Neuromuscular re-education, 97535- Self Care, 16109- Manual therapy, [id number]- Gait training, [phone]- Aquatic Therapy, 97014- Electrical stimulation (unattended), [phone]- Ultrasound, Patient/Family education, Balance training, Stair  training, Taping, Dry Needling, Joint mobilization, Scar mobilization, Cryotherapy, and Moist heat  PLAN FOR NEXT SESSION:  Cont per Dr. Serena Croissant gluteus medius repair.  Be aware of healing ulcer in distal L LE at post heel/ankle.      Reola Mosher Jiovani Mccammon, PT 07/27/2023, 1:47 PM

## 2023-07-28 ENCOUNTER — Ambulatory Visit (INDEPENDENT_AMBULATORY_CARE_PROVIDER_SITE_OTHER): Payer: Medicare Other

## 2023-07-28 DIAGNOSIS — E538 Deficiency of other specified B group vitamins: Secondary | ICD-10-CM | POA: Diagnosis not present

## 2023-07-28 MED ORDER — CYANOCOBALAMIN 1000 MCG/ML IJ SOLN
1000.0000 ug | Freq: Once | INTRAMUSCULAR | Status: AC
  Administered 2023-07-28: 1000 ug via INTRAMUSCULAR

## 2023-07-28 NOTE — Progress Notes (Signed)
 After obtaining consent, and per orders of Dr. Jimmey Ralph, injection of B12 given by Ferdie Ping. Patient instructed to report any adverse reaction to me immediately.

## 2023-07-29 ENCOUNTER — Ambulatory Visit: Payer: Self-pay | Admitting: Family Medicine

## 2023-07-29 ENCOUNTER — Ambulatory Visit (HOSPITAL_BASED_OUTPATIENT_CLINIC_OR_DEPARTMENT_OTHER): Payer: Medicare Other

## 2023-07-29 NOTE — Telephone Encounter (Signed)
 Copied from CRM [id number]. Topic: Clinical - Red Word Triage >> Jul 29, 2023  8:49 AM Kathryne Eriksson wrote: Red Word that prompted transfer to Nurse Triage: Severe Pain >> Jul 29, 2023  8:50 AM Kathryne Eriksson wrote: Patient states she's experiencing some severe pain in her feet (heels). Patient is also having some redness / swelling. Patient has ulcers as well as edema.   Reason for Disposition  [1] Redness of the skin AND [2] no fever  Answer Assessment - Initial Assessment Questions 1. ONSET: "When did the pain start?"      I'm having swelling pain, and ulcers on my heels.    It's a bacterial infection. They are getting red and infected.    2. LOCATION: "Where is the pain located?"      August this all started 3. PAIN: "How bad is the pain?"    (Scale 1-10; or mild, moderate, severe)  - MILD (1-3): doesn't interfere with normal activities.   - MODERATE (4-7): interferes with normal activities (e.g., work or school) or awakens from sleep, limping.   - SEVERE (8-10): excruciating pain, unable to do any normal activities, unable to walk.      Severe 4. WORK OR EXERCISE: "Has there been any recent work or exercise that involved this part of the body?"      No 5. CAUSE: "What do you think is causing the foot pain?"     Bacterial infection.   3 different antibiotics have not helped. 6. OTHER SYMPTOMS: "Do you have any other symptoms?" (e.g., leg pain, rash, fever, numbness)     Swelling, redness, pain and ulcers 7. PREGNANCY: "Is there any chance you are pregnant?" "When was your last menstrual period?"     N/A due to age  Protocols used: Foot Pain-A-AH  Chief Complaint: Both feet have ulcers that are getting infected looking, red and swollen and painful Symptoms: above Frequency: Since last Aug. But getting worse Pertinent Negatives: Patient denies drainage Disposition: [] ED /[] Urgent Care (no appt availability in office) / [x] Appointment(In office/virtual)/ []  [facility]/  [] Home Care/ [] Refused Recommended Disposition /[] [facility]/ []  Follow-up with PCP Additional Notes: Appt made for tomorrow at pt's request.   Not able to come in today.

## 2023-07-30 ENCOUNTER — Encounter: Payer: Self-pay | Admitting: Family Medicine

## 2023-07-30 ENCOUNTER — Ambulatory Visit (INDEPENDENT_AMBULATORY_CARE_PROVIDER_SITE_OTHER): Admitting: Family Medicine

## 2023-07-30 ENCOUNTER — Encounter (HOSPITAL_BASED_OUTPATIENT_CLINIC_OR_DEPARTMENT_OTHER): Payer: Medicare Other | Admitting: Orthopaedic Surgery

## 2023-07-30 VITALS — BP 146/73 | HR 71 | Temp 97.2°F | Ht 61.0 in | Wt 113.2 lb

## 2023-07-30 DIAGNOSIS — E538 Deficiency of other specified B group vitamins: Secondary | ICD-10-CM | POA: Insufficient documentation

## 2023-07-30 DIAGNOSIS — I10 Essential (primary) hypertension: Secondary | ICD-10-CM

## 2023-07-30 DIAGNOSIS — R600 Localized edema: Secondary | ICD-10-CM

## 2023-07-30 DIAGNOSIS — L0390 Cellulitis, unspecified: Secondary | ICD-10-CM

## 2023-07-30 DIAGNOSIS — L97301 Non-pressure chronic ulcer of unspecified ankle limited to breakdown of skin: Secondary | ICD-10-CM

## 2023-07-30 MED ORDER — MUPIROCIN 2 % EX OINT: 1.0000 | TOPICAL_OINTMENT | Freq: Two times a day (BID) | CUTANEOUS | 3 refills | Status: DC

## 2023-07-30 MED ORDER — DOXYCYCLINE HYCLATE 100 MG PO TABS: 100.0000 mg | ORAL_TABLET | Freq: Two times a day (BID) | ORAL | 0 refills | Status: DC

## 2023-07-30 NOTE — Assessment & Plan Note (Signed)
 Secondary to to venous insufficiency.  She is on Lasix 40 mg daily which does cause her to urinate.  She is also working on conservative measures though still has ongoing lower extremity edema that has worsened in the last few weeks.  Will be placing referral to wound care and vascular surgery as above.  Will treating her cellulitis today as above.  We discussed reasons to return to care.

## 2023-07-30 NOTE — Assessment & Plan Note (Signed)
 Mildly elevated though at goal per JNC 8.  Continue losartan 50 mg daily and HCTZ 12.5 mg daily.

## 2023-07-30 NOTE — Assessment & Plan Note (Signed)
 Likely secondary to venous stasis.  We have tried conservative measures plus Lasix for the last several months however symptoms have persisted.  Her ABIs from a few months ago were normal.  She is agreeable to see a wound care specialist at this point.  I would also be reasonable to have her follow back up with vascular surgery to discuss any other management options.  Will place referrals today.  We discussed reasons to return to care.

## 2023-07-30 NOTE — Assessment & Plan Note (Signed)
 She has received 3 doses of B12 replacement.  Too early to recheck today.  Will recheck again in a few months.

## 2023-07-30 NOTE — Patient Instructions (Signed)
 It was very nice to see you today!  Please start the doxycycline.  Also use a topical antibiotic.  I will refer you to see wound care and vascular surgery.  Let us know if your symptoms do not improve.  Return if symptoms worsen or fail to improve.   Take care, Dr Jimmey Ralph  PLEASE NOTE:  If you had any lab tests, please let us know if you have not heard back within a few days. You may see your results on mychart before we have a chance to review them but we will give you a call once they are reviewed by [place].   If we ordered any referrals today, please let us know if you have not heard from their office within the next week.   If you had any urgent prescriptions sent in today, please check with the pharmacy within an hour of our visit to make sure the prescription was transmitted appropriately.   Please try these tips to maintain a healthy lifestyle:  Eat at least 3 REAL meals and 1-2 snacks per day.  Aim for no more than 5 hours between eating.  If you eat breakfast, please do so within one hour of getting up.   Each meal should contain half fruits/vegetables, one quarter protein, and one quarter carbs (no bigger than a computer mouse)  Cut down on sweet beverages. This includes juice, soda, and sweet tea.   Drink at least 1 glass of water with each meal and aim for at least 8 glasses per day  Exercise at least 150 minutes every week.

## 2023-07-30 NOTE — Progress Notes (Signed)
 Ellen [name] is a 83 y.o. female who presents today for an office visit.  Assessment/Plan:  New/Acute Problems: Cellulitis  No red flags or signs of systemic illness.  This has been a recurrent issue related to her venous stasis ulcers as below.  Will treat with course of doxycycline.  Also refilled topical mupirocin.  She has done well with this combination in the past.  She will let us know if not improving with this.  We are treating her venous stasis as below.  Chronic Problems Addressed Today: Skin ulcer of ankle, limited to breakdown of skin (HCC) Likely secondary to venous stasis.  We have tried conservative measures plus Lasix for the last several months however symptoms have persisted.  Her ABIs from a few months ago were normal.  She is agreeable to see a wound care specialist at this point.  I would also be reasonable to have her follow back up with vascular surgery to discuss any other management options.  Will place referrals today.  We discussed reasons to return to care.  Leg edema Secondary to to venous insufficiency.  She is on Lasix 40 mg daily which does cause her to urinate.  She is also working on conservative measures though still has ongoing lower extremity edema that has worsened in the last few weeks.  Will be placing referral to wound care and vascular surgery as above.  Will treating her cellulitis today as above.  We discussed reasons to return to care.  Hypertension Mildly elevated though at goal per JNC 8.  Continue losartan 50 mg daily and HCTZ 12.5 mg daily.  B12 deficiency She has received 3 doses of B12 replacement.  Too early to recheck today.  Will recheck again in a few months.     Subjective:  HPI:  See A/P for status of chronic conditions.  Patient here today for leg edema, leg pain, and ulcer on bilateral ankles.  This has been an ongoing issue for the last several months and we have seen her about 8 times over the last 6 months for this.  We  last saw her about a month ago.  At that time we continued her on Lasix 40 mg daily and conservative measures including salt avoidance and leg elevation.  Her ulcers were improving at her most recent office visit.  We had previously discussed referral to wound care or back to vascular surgery multiple times however she had declined.  We did obtain bilateral ABIs a few months ago which were normal.  Since her last visit she has had continued issues with lower extremity edema.  In addition the ulcers on the back of her ankles bilaterally seems to have worsened.  She is having more burning and stinging.  More drainage from the area.  She is worried that it may be infected.  Very tender to palpation.  No fevers or chills.  At her visit here a month ago we also did check labs which indicated low B12.  She has received 3 weeks of B12 injections however has not noticed that this has made much of a difference as of yet.       Objective:  Physical Exam: BP (!) 146/73   Pulse 71   Temp (!) 97.2 F (36.2 C) (Temporal)   Ht 5\' 1"  (1.549 m)   Wt 113 lb 3.2 oz (51.3 kg)   SpO2 97%   BMI 21.39 kg/m   Gen: No acute distress, resting comfortably MUSCULOSKELETAL: 2+ pitting  edema to knees bilaterally.  Approximately 4 to 5 cm ulcer on posterior right ankle.  Eschar present.  Surrounding erythema that is tender to palpation.  Serous drainage present.  Neurovascular intact distally. Neuro: Grossly normal, moves all extremities Psych: Normal affect and thought content      Ellen [name] M. Jimmey Ralph, MD 07/30/2023 8:30 AM

## 2023-08-03 ENCOUNTER — Ambulatory Visit (HOSPITAL_BASED_OUTPATIENT_CLINIC_OR_DEPARTMENT_OTHER): Payer: Medicare Other | Admitting: Physical Therapy

## 2023-08-03 ENCOUNTER — Encounter (HOSPITAL_BASED_OUTPATIENT_CLINIC_OR_DEPARTMENT_OTHER): Payer: Self-pay | Admitting: Physical Therapy

## 2023-08-03 DIAGNOSIS — M25552 Pain in left hip: Secondary | ICD-10-CM

## 2023-08-03 DIAGNOSIS — M6281 Muscle weakness (generalized): Secondary | ICD-10-CM | POA: Diagnosis not present

## 2023-08-03 DIAGNOSIS — M25652 Stiffness of left hip, not elsewhere classified: Secondary | ICD-10-CM | POA: Diagnosis not present

## 2023-08-03 DIAGNOSIS — R262 Difficulty in walking, not elsewhere classified: Secondary | ICD-10-CM | POA: Diagnosis not present

## 2023-08-03 NOTE — Therapy (Signed)
 OUTPATIENT PHYSICAL THERAPY TREATMENT         Patient Name: Ellen [name] MRN: [id number] DOB:09/13/1940, 83 y.o., female Today's Date: 08/03/2023    END OF SESSION:  PT End of Session - 08/03/23 1258     Visit Number 19    Number of Visits 26    Date for PT Re-Evaluation 08/23/23    Authorization Type MCR A & B    Progress Note Due on Visit 24    PT Start Time 1300    PT Stop Time 1340    PT Time Calculation (min) 40 min    Activity Tolerance Patient tolerated treatment well    Behavior During Therapy WFL for tasks assessed/performed                    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   Left Buccal Cheek (Keratoacanthoma)   Umbilical hernia    Past Surgical History:  Procedure Laterality Date   BOWEL RESECTION  07/09/2013   Procedure: SMALL BOWEL RESECTION;  Surgeon: Velora Heckler, MD;  Location: [facility];  Service: General;;   Brock Bad REPAIR Left 03/22/2023   Procedure: LEFT GLUTEUS MEDIUS REPAIR WITH POSSIBLE COLLAGEN PATCH AUGMENTATION;  Surgeon: Huel Cote, MD;  Location: [facility];  Service: Orthopedics;  Laterality: Left;   LAPAROTOMY N/A 07/09/2013   Procedure: EXPLORATORY LAPAROTOMY;  Surgeon: Velora Heckler, MD;  Location: [facility];  Service: General;  Laterality: N/A;   SHOULDER ARTHROSCOPY WITH CAPSULORRHAPHY     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   Hypokalemia 12/27/2022   Volume depletion 12/24/2022   Constipation 12/24/2022   Partial small bowel obstruction (HCC) 12/24/2022   Polycythemia 12/24/2022   Gastroesophageal reflux disease 08/26/2022   Bloating  08/26/2022   Small intestinal bacterial overgrowth (SIBO) 08/26/2022   Osteoarthritis 12/01/2021   Plantar wart 12/01/2021   Ventral hernia 01/01/2021   Diverticulosis 01/05/2019   Dermatitis 01/05/2019   Systolic murmur 07/25/2018   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   Hyperglycemia 10/05/2017   Leg edema 10/05/2017   Hammer toe 10/05/2017   Hot flashes due to menopause 10/05/2017   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/10/2013 07/11/2013   Hypertension 07/09/2013    REFERRING PROVIDER: Huel Cote, MD  REFERRING DIAG: [phone] (ICD-10-CM) - Tendinopathy of gluteus medius  S/p glute med repair  THERAPY DIAG:  Pain in left hip  Muscle weakness (generalized)  Stiffness of left hip, not elsewhere classified  Difficulty in walking, not elsewhere classified  Rationale for Evaluation and Treatment: Rehabilitation  ONSET DATE: DOS 03/22/2023  Days since surgery: 134   SUBJECTIVE:   SUBJECTIVE STATEMENT: Patient states had to switch appointment with MD. Gets catching in her hip near incision area and it hurts.      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    PAIN:  NPRS:  3/10 current, 8/10 worst.  Worst pain is 1st thing in AM.  Location: L anterior hip  PRECAUTIONS: Other: per surgical protocol, Ulcer on L heel/post ankle/achilles, lumbar anterolisthesis, double hernia    WEIGHT BEARING RESTRICTIONS: Yes WBAT  FALLS:  Has patient fallen in last 6 months? No  LIVING ENVIRONMENT: Lives with: lives with their spouse Lives in: home; lives on 1 floor though does have to go to basement. Stairs: 15 stairs to get to basement Has following equipment at home:  Single point cane, Walker - 2 wheeled, Wheelchair (manual), and rollator  OCCUPATION:  Pt is semi-retired.  Pt is a Human resources officer.    PLOF: Independent  Pt performed a walking program and would walk 1-1.5 hours daily.  Pt was ambulating without AD.    PATIENT GOALS: to walk straight and walk normally, improve strength  NEXT MD VISIT: 05/07/2023  OBJECTIVE:  Note: Objective measures were completed at Evaluation unless otherwise noted.   DIAGNOSTIC FINDINGS: Pt is post op.      TODAY'S TREATMENT:    08/03/23 Nustep L5 UE/LE's x Manual: STM to hip flexors/ rectus femoris, quads Tall kneeling hip hinge 2 x 10 Mini squat lateral side out 2 x 10 Mini lunge 1 x 10  07/27/23 Nustep L5 UE/LE's x Manual: STM to hip flexors/ rectus femoris, quads Sidelying hip abduction 2 x 10, circles 2 x 10  Forward step up 4 inch 2 x 10 Lateral step up 4 inch 2 x 10 Standing hip extension with knee flexed x20 Tall kneeling hip hinge 2 x 10  07/23/23 Nustep L4 UE/LE's x Manual: STM to hip flexors/ rectus femoris, quads Thomas stretch Passive hip strengthning for adductors Sidelying hip abduction 3x10L  Standing hip stretches for adductors and flexors Forward step up 4 inch 2 x 10 Standing hip extension with knee flexed x20  Tall kneeling hip hinge 1 x 10 Gait in hall  07/21/23 Nustep L4 UE/LE's x 5 mins Manual: STM to hip flexors/ rectus femoris, quads Forward step up 4 inch 1 x 10 Lateral step up 2 x 10  Standing hip extension RTB at knees 2 x 10  Tall kneeling hip hinge 1 x 10  2/27 Nustep L4 UE/LE's x 5 mins Hip hiking 2x10 bilat Lateral band walks with YTB above knees x 2 laps at rail with UE support Standing hip abd x10 bilat, YTB above knees x 10 reps bilat Step downs on 4 inch step 2x10 Step ups on 2 inch step 2x10 Standing marching with UE support 2x10 LAQ 2# x 10, 3# 2x10  PT updated HEP and gave pt a HEP handout.  PT educated pt in correct form  and appropriate frequency.  PT instructed pt she should not have pain with HEP.       PATIENT EDUCATION:  Education details:  dx, relevant anatomy, HEP, POC, exercise form, rationale of interventions, and post op and protocol restrictions/limitations.  06/09/23: reassessment findings Person educated: Patient Education method: Explanation, Demonstration, Tactile cues, Verbal cues, and Handouts Education comprehension: verbalized understanding, returned demonstration, verbal cues required, tactile cues required, and needs further education  HOME EXERCISE PROGRAM: Access Code: [email or web address] URL: [email or web address] Date: 04/14/2023 Prepared by: Aaron Edelman  Updated HEP: - Side Stepping with Counter Support  - 1 x daily - 5-6 x weekly - 2 sets - 10 reps - Standing Hip Hiking  - 1 x daily - 5 x weekly -  2 sets - 10 reps - Standing Hip Abduction with Counter Support  - 1 x daily - 5 x weekly - 2 sets - 10 reps - Standing March with Counter Support  - 1 x daily - 6-7 x weekly - 2 sets - 10 reps  ASSESSMENT:  CLINICAL IMPRESSION: Patient symptoms likely related to hyperactive and tender hip flexors. Patient with slight decrease in tissue tension with manual but continues to have hyperactive musculature. Continued with glute and functional strengthening which is tolerated well. Educated on safety with planting at home today.  Patient will continue to benefit from physical therapy in order to improve function and reduce impairment.     OBJECTIVE IMPAIRMENTS: Abnormal gait, decreased activity tolerance, decreased endurance, decreased mobility, difficulty walking, decreased ROM, decreased strength, hypomobility, and pain.   ACTIVITY LIMITATIONS: carrying, lifting, standing, squatting, stairs, transfers, and locomotion level  PARTICIPATION LIMITATIONS: cleaning and yard work  PERSONAL FACTORS: 3+ comorbidities: L LE ulcer, double hernia, arthritis  are also affecting patient's  functional outcome.   REHAB POTENTIAL: Good  CLINICAL DECISION MAKING: Stable/uncomplicated  EVALUATION COMPLEXITY: Low   GOALS:   SHORT TERM GOALS:  Pt will be independent and compliant with HEP for improved pain, strength, and function. Baseline: Goal status: MET Target date:  05/05/2023   2.  Pt will demo improved quality of gait with increased stance time and toe off on L.   Baseline:  Goal status:  GOAL MET  07/12/23 Target date:  05/12/2023  3.  Pt will progress with exercises per protocol without adverse effects for improved strength and mobility.   Baseline:  Goal status: GOAL MET  07/12/23 Target date:  05/26/2023   4.  Pt will report improved tolerance with standing activities and ambulation.  Baseline:  Goal status:  GOAL MET  07/12/23 Target date:  06/16/2023   5.  Pt will ambulate with a normalized heel to toe gait without limping.  Baseline:  Goal status:  PROGRESSING  07/12/23 Target date:  06/23/2023   6.  Pt will be able to perform a 6 inch step up with good form and control with L LE leading for improved performance of stairs abd and improved functional strength. Baseline:  Goal status: NOT MET  07/12/23 Target date:  06/30/2023   LONG TERM GOALS: Target date: 08/23/2023   Pt will be able to ambulate extended community distance without significant difficulty and pain.   Baseline:  Goal status: ONGOING   2.  Pt will be able to perform her ADLs and IADLs including household chores without significant difficulty and pain. Baseline:  Goal status: ONGOING  3.  Pt will be able to take care of her husband without significant limitations.   Baseline:  Goal status:  GOAL MET  07/12/23  4.  Pt will be able to perform mini squats with good form and without increased pain in order for improved functional LE strength and to assist with returning to gardening activities.   Baseline:  Goal status: PROGRESSING      PLAN:  PT FREQUENCY:  2x/wk  PT  DURATION: 6 weeks  PLANNED INTERVENTIONS: 97164- PT Re-evaluation, 97110-Therapeutic exercises, 97530- Therapeutic activity, [id number]- Neuromuscular re-education, 97535- Self Care, 10272- Manual therapy, [id number]- Gait training, [phone]- Aquatic Therapy, 97014- Electrical stimulation (unattended), [phone]- Ultrasound, Patient/Family education, Balance training, Stair training, Taping, Dry Needling, Joint mobilization, Scar mobilization, Cryotherapy, and Moist heat  PLAN FOR NEXT SESSION:  Cont per Dr. Serena Croissant gluteus medius repair.  Be aware of healing  ulcer in distal L LE at post heel/ankle.      Reola Mosher Amey Hossain, PT 08/03/2023, 1:45 PM

## 2023-08-04 ENCOUNTER — Ambulatory Visit (INDEPENDENT_AMBULATORY_CARE_PROVIDER_SITE_OTHER): Payer: Medicare Other

## 2023-08-04 DIAGNOSIS — E538 Deficiency of other specified B group vitamins: Secondary | ICD-10-CM | POA: Diagnosis not present

## 2023-08-04 MED ORDER — CYANOCOBALAMIN 1000 MCG/ML IJ SOLN
1000.0000 ug | Freq: Once | INTRAMUSCULAR | Status: AC
  Administered 2023-08-04: 1000 ug via INTRAMUSCULAR

## 2023-08-04 NOTE — Progress Notes (Signed)
 Patient visits today for their b-12 injection. Patient informed of what they had received and tolerated injection well. Patient notified to reach out to office if needed.

## 2023-08-05 ENCOUNTER — Encounter (HOSPITAL_BASED_OUTPATIENT_CLINIC_OR_DEPARTMENT_OTHER): Payer: Self-pay

## 2023-08-05 ENCOUNTER — Ambulatory Visit (HOSPITAL_BASED_OUTPATIENT_CLINIC_OR_DEPARTMENT_OTHER): Payer: Medicare Other

## 2023-08-05 DIAGNOSIS — M25552 Pain in left hip: Secondary | ICD-10-CM | POA: Diagnosis not present

## 2023-08-05 DIAGNOSIS — R262 Difficulty in walking, not elsewhere classified: Secondary | ICD-10-CM

## 2023-08-05 DIAGNOSIS — M6281 Muscle weakness (generalized): Secondary | ICD-10-CM

## 2023-08-05 DIAGNOSIS — M25652 Stiffness of left hip, not elsewhere classified: Secondary | ICD-10-CM

## 2023-08-05 NOTE — Therapy (Signed)
 OUTPATIENT PHYSICAL THERAPY TREATMENT         Patient Name: Ellen [name] MRN: [id number] DOB:12/31/1940, 83 y.o., female Today's Date: 08/05/2023    END OF SESSION:  PT End of Session - 08/05/23 1304     Visit Number 20    Number of Visits 26    Date for PT Re-Evaluation 08/23/23    Authorization Type MCR A & B    Progress Note Due on Visit 24    PT Start Time 1302    PT Stop Time 1345    PT Time Calculation (min) 43 min    Activity Tolerance Patient tolerated treatment well    Behavior During Therapy WFL for tasks assessed/performed                     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   Left Buccal Cheek (Keratoacanthoma)   Umbilical hernia    Past Surgical History:  Procedure Laterality Date   BOWEL RESECTION  07/09/2013   Procedure: SMALL BOWEL RESECTION;  Surgeon: Velora Heckler, MD;  Location: [facility];  Service: General;;   Brock Bad REPAIR Left 03/22/2023   Procedure: LEFT GLUTEUS MEDIUS REPAIR WITH POSSIBLE COLLAGEN PATCH AUGMENTATION;  Surgeon: Huel Cote, MD;  Location: [facility];  Service: Orthopedics;  Laterality: Left;   LAPAROTOMY N/A 07/09/2013   Procedure: EXPLORATORY LAPAROTOMY;  Surgeon: Velora Heckler, MD;  Location: [facility];  Service: General;  Laterality: N/A;   SHOULDER ARTHROSCOPY WITH CAPSULORRHAPHY     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   Hypokalemia 12/27/2022   Volume depletion 12/24/2022   Constipation 12/24/2022   Partial small bowel obstruction (HCC) 12/24/2022   Polycythemia 12/24/2022   Gastroesophageal reflux disease 08/26/2022   Bloating  08/26/2022   Small intestinal bacterial overgrowth (SIBO) 08/26/2022   Osteoarthritis 12/01/2021   Plantar wart 12/01/2021   Ventral hernia 01/01/2021   Diverticulosis 01/05/2019   Dermatitis 01/05/2019   Systolic murmur 07/25/2018   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   Hyperglycemia 10/05/2017   Leg edema 10/05/2017   Hammer toe 10/05/2017   Hot flashes due to menopause 10/05/2017   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/10/2013 07/11/2013   Hypertension 07/09/2013    REFERRING PROVIDER: Huel Cote, MD  REFERRING DIAG: [phone] (ICD-10-CM) - Tendinopathy of gluteus medius  S/p glute med repair  THERAPY DIAG:  Pain in left hip  Muscle weakness (generalized)  Stiffness of left hip, not elsewhere classified  Difficulty in walking, not elsewhere classified  Rationale for Evaluation and Treatment: Rehabilitation  ONSET DATE: DOS 03/22/2023  Days since surgery: 136   SUBJECTIVE:   SUBJECTIVE STATEMENT: Pt reports continued catching in L hip with walking as well as other movements. States this bothers her the most. Also reports hip tightness.      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    PAIN:  NPRS:  3/10 current, 8/10 worst.  Worst pain is 1st thing in AM.  Location: L anterior hip  PRECAUTIONS: Other: per surgical protocol, Ulcer on L heel/post ankle/achilles, lumbar anterolisthesis, double hernia    WEIGHT BEARING RESTRICTIONS: Yes WBAT  FALLS:  Has patient fallen in last 6 months? No  LIVING ENVIRONMENT: Lives with: lives with their spouse Lives in: home; lives on 1 floor though does have to go to basement. 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    PLOF: Independent  Pt performed a walking program and would walk 1-1.5 hours daily.  Pt was ambulating without AD.    PATIENT GOALS: to walk straight and walk normally, improve strength  NEXT MD VISIT: 05/07/2023  OBJECTIVE:  Note: Objective measures were completed at Evaluation unless otherwise noted.   DIAGNOSTIC FINDINGS: Pt is post op.      TODAY'S TREATMENT:     08/05/23 Nustep L5 UE/LE's x Manual: STM to hip flexors/ rectus femoris, quads, adductors PROM L hip Manual stretching of L hip    08/03/23 Nustep L5 UE/LE's x Manual: STM to hip flexors/ rectus femoris, quads Tall kneeling hip hinge 2 x 10 Mini squat lateral side out 2 x 10 Mini lunge 1 x 10  07/27/23 Nustep L5 UE/LE's x Manual: STM to hip flexors/ rectus femoris, quads Sidelying hip abduction 2 x 10, circles 2 x 10  Forward step up 4 inch 2 x 10 Lateral step up 4 inch 2 x 10 Standing hip extension with knee flexed x20 Tall kneeling hip hinge 2 x 10  07/23/23 Nustep L4 UE/LE's x Manual: STM to hip flexors/ rectus femoris, quads Thomas stretch Passive hip strengthning for adductors Sidelying hip abduction 3x10L  Standing hip stretches for adductors and flexors Forward step up 4 inch 2 x 10 Standing hip extension with knee flexed x20  Tall kneeling hip hinge 1 x 10 Gait in hall  07/21/23 Nustep L4 UE/LE's x 5 mins Manual: STM to hip flexors/ rectus femoris, quads Forward step up 4 inch 1 x 10 Lateral step up 2 x 10  Standing hip extension RTB at knees 2 x 10  Tall kneeling hip hinge 1 x 10  2/27 Nustep L4 UE/LE's x 5 mins Hip hiking 2x10 bilat Lateral band walks with YTB above knees x 2 laps at rail with UE support Standing hip abd x10 bilat, YTB above knees x 10 reps bilat Step downs on 4 inch step  2x10 Step ups on 2 inch step 2x10 Standing marching with UE support 2x10 LAQ 2# x 10, 3# 2x10  PT updated HEP and gave pt a HEP handout.  PT educated pt in correct form and appropriate frequency.  PT instructed pt she should not have pain with HEP.       PATIENT EDUCATION:  Education details:  dx, relevant anatomy, HEP, POC, exercise form, rationale of interventions, and post op and protocol restrictions/limitations.  06/09/23: reassessment findings Person educated: Patient Education method: Explanation, Demonstration, Tactile cues, Verbal cues, and Handouts Education comprehension: verbalized understanding, returned demonstration, verbal cues required, tactile cues required, and needs further education  HOME EXERCISE PROGRAM: Access Code: [email or web address] URL: [email or web address] Date: 04/14/2023 Prepared by: Aaron Edelman  Updated HEP: -  Side Stepping with Counter Support  - 1 x daily - 5-6 x weekly - 2 sets - 10 reps - Standing Hip Hiking  - 1 x daily - 5 x weekly - 2 sets - 10 reps - Standing Hip Abduction with Counter Support  - 1 x daily - 5 x weekly - 2 sets - 10 reps - Standing March with Counter Support  - 1 x daily - 6-7 x weekly - 2 sets - 10 reps  ASSESSMENT:  CLINICAL IMPRESSION: Continued with manual intervention to address tightness within anterior hip, adductors, and gluteal mm. Pt restricted with hip abduction stretching due to tightness throughout groin and length of adductor mm. She also exhibits tenderness throughout these areas. Continues to ambulate with shortened step lengths and did have discomfort in lateral hip following STM when ambulating.  Pt to see surgeon tomorrow for f/u.     OBJECTIVE IMPAIRMENTS: Abnormal gait, decreased activity tolerance, decreased endurance, decreased mobility, difficulty walking, decreased ROM, decreased strength, hypomobility, and pain.   ACTIVITY LIMITATIONS: carrying, lifting, standing, squatting, stairs, transfers,  and locomotion level  PARTICIPATION LIMITATIONS: cleaning and yard work  PERSONAL FACTORS: 3+ comorbidities: L LE ulcer, double hernia, arthritis  are also affecting patient's functional outcome.   REHAB POTENTIAL: Good  CLINICAL DECISION MAKING: Stable/uncomplicated  EVALUATION COMPLEXITY: Low   GOALS:   SHORT TERM GOALS:  Pt will be independent and compliant with HEP for improved pain, strength, and function. Baseline: Goal status: MET Target date:  05/05/2023   2.  Pt will demo improved quality of gait with increased stance time and toe off on L.   Baseline:  Goal status:  GOAL MET  07/12/23 Target date:  05/12/2023  3.  Pt will progress with exercises per protocol without adverse effects for improved strength and mobility.   Baseline:  Goal status: GOAL MET  07/12/23 Target date:  05/26/2023   4.  Pt will report improved tolerance with standing activities and ambulation.  Baseline:  Goal status:  GOAL MET  07/12/23 Target date:  06/16/2023   5.  Pt will ambulate with a normalized heel to toe gait without limping.  Baseline:  Goal status:  PROGRESSING  07/12/23 Target date:  06/23/2023   6.  Pt will be able to perform a 6 inch step up with good form and control with L LE leading for improved performance of stairs abd and improved functional strength. Baseline:  Goal status: NOT MET  07/12/23 Target date:  06/30/2023   LONG TERM GOALS: Target date: 08/23/2023   Pt will be able to ambulate extended community distance without significant difficulty and pain.   Baseline:  Goal status: ONGOING   2.  Pt will be able to perform her ADLs and IADLs including household chores without significant difficulty and pain. Baseline:  Goal status: ONGOING  3.  Pt will be able to take care of her husband without significant limitations.   Baseline:  Goal status:  GOAL MET  07/12/23  4.  Pt will be able to perform mini squats with good form and without increased pain in order for  improved functional LE strength and to assist with returning to gardening activities.   Baseline:  Goal status: PROGRESSING      PLAN:  PT FREQUENCY:  2x/wk  PT DURATION: 6 weeks  PLANNED INTERVENTIONS: 97164- PT Re-evaluation, 97110-Therapeutic exercises, 97530- Therapeutic activity, [id number]- Neuromuscular re-education, 97535- Self Care, 40102- Manual therapy, [id number]- Gait training, [id number]- Aquatic Therapy, 97014- Electrical stimulation (unattended),  [id number]- Ultrasound, Patient/Family education, Balance training, Stair training, Taping, Dry Needling, Joint mobilization, Scar mobilization, Cryotherapy, and Moist heat  PLAN FOR NEXT SESSION:  Cont per Dr. Serena Croissant gluteus medius repair.  Be aware of healing ulcer in distal L LE at post heel/ankle.      Donnel Saxon Jayden Kratochvil, PTA 08/05/2023, 3:18 PM

## 2023-08-06 ENCOUNTER — Ambulatory Visit (HOSPITAL_BASED_OUTPATIENT_CLINIC_OR_DEPARTMENT_OTHER): Admitting: Orthopaedic Surgery

## 2023-08-06 DIAGNOSIS — M25552 Pain in left hip: Secondary | ICD-10-CM | POA: Diagnosis not present

## 2023-08-06 DIAGNOSIS — M67959 Unspecified disorder of synovium and tendon, unspecified thigh: Secondary | ICD-10-CM | POA: Diagnosis not present

## 2023-08-06 MED ORDER — TRIAMCINOLONE ACETONIDE 40 MG/ML IJ SUSP
80.0000 mg | INTRAMUSCULAR | Status: AC | PRN
  Administered 2023-08-06: 80 mg via INTRA_ARTICULAR

## 2023-08-06 MED ORDER — LIDOCAINE HCL 1 % IJ SOLN
4.0000 mL | INTRAMUSCULAR | Status: AC | PRN
  Administered 2023-08-06: 4 mL

## 2023-08-06 NOTE — Progress Notes (Signed)
 Post Operative Evaluation    Procedure/Date of Surgery: Left hip gluteus medius repair 11/4  Interval History:    Presents today for follow-up of the left hip.  At today's visit she is experiencing pain predominantly about the groin and her hip flexor.  Denies any lateral based hip pain aside from laying on the side  PMH/PSH/Family History/Social History/Meds/Allergies:    Past Medical History:  Diagnosis Date  . Allergy   . Anxiety   . Arthritis   . Bursitis    left thigh  . GERD (gastroesophageal reflux disease)   . Hypertension   . Kidney stones   . 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 Umbilical hernia    Past Surgical History:  Procedure Laterality Date  . BOWEL RESECTION  07/09/2013   Procedure: SMALL BOWEL RESECTION;  Surgeon: Velora Heckler, MD;  Location: [facility];  Service: General;;  . Brock Bad REPAIR Left 03/22/2023   Procedure: LEFT GLUTEUS MEDIUS REPAIR WITH POSSIBLE COLLAGEN PATCH AUGMENTATION;  Surgeon: Huel Cote, MD;  Location: [facility];  Service: Orthopedics;  Laterality: Left;  . LAPAROTOMY N/A 07/09/2013   Procedure: EXPLORATORY LAPAROTOMY;  Surgeon: Velora Heckler, MD;  Location: [facility];  Service: General;  Laterality: N/A;  . SHOULDER ARTHROSCOPY WITH CAPSULORRHAPHY     Social History   Socioeconomic History  . Marital status: Married    Spouse name: Not on file  . Number of children: 0  . Years of education: Not on file  . Highest education level: Not on file  Occupational History  . Occupation: Retired   Tobacco Use  . Smoking status: Former    Types: Cigarettes  . Smokeless tobacco: Never  Vaping Use  . Vaping status: Never Used  Substance and Sexual Activity  . Alcohol use: Yes    Alcohol/week: 7.0 standard drinks of alcohol    Types: 7 Glasses of wine per week    Comment: nightly glass of wine  . Drug use: No  . Sexual  activity: Not Currently  Other Topics Concern  . Not on file  Social History Narrative   Enjoys gardening    Social Drivers of Health   Financial Resource Strain: Low Risk  (07/22/2023)   Overall Financial Resource Strain (CARDIA)   . Difficulty of Paying Living Expenses: Not hard at all  Food Insecurity: No Food Insecurity (07/22/2023)   Hunger Vital Sign   . Worried About Programme researcher, broadcasting/film/video in the Last Year: Never true   . Ran Out of Food in the Last Year: Never true  Transportation Needs: No Transportation Needs (07/22/2023)   PRAPARE - Transportation   . Lack of Transportation (Medical): No   . Lack of Transportation (Non-Medical): No  Physical Activity: Sufficiently Active (07/22/2023)   Exercise Vital Sign   . Days of Exercise per Week: 5 days   . Minutes of Exercise per Session: 120 min  Stress: No Stress Concern Present (07/22/2023)   [employer] of Occupational Health - Occupational Stress Questionnaire   . Feeling of Stress : Not at all  Social Connections: Moderately Integrated (07/22/2023)   Social Connection and Isolation Panel [NHANES]   . Frequency of Communication with Friends and Family: Once a week   . Frequency of  Social Gatherings with Friends and Family: More than three times a week   . Attends Religious Services: More than 4 times per year   . Active Member of Clubs or Organizations: No   . Attends Banker Meetings: Never   . Marital Status: Married   Family History  Problem Relation Age of Onset  . Diabetes Mother   . Heart attack Mother   . CAD Father   . Heart attack Father   . Breast cancer Neg Hx   . Colon cancer Neg Hx   . Esophageal cancer Neg Hx   . Rectal cancer Neg Hx   . Stomach cancer Neg Hx    Allergies  Allergen Reactions  . Lactose Intolerance (Gi) Diarrhea  . Sulfa Antibiotics Nausea And Vomiting and Swelling   Current Outpatient Medications  Medication Sig Dispense Refill  . Calcium Carbonate Antacid (TUMS PO) Take 1  tablet by mouth as needed (reflux).    . Carboxymethylcellulose Sodium (EYE DROPS OP) Place 2 drops into both eyes daily. Thera Tears    . doxycycline (VIBRA-TABS) 100 MG tablet Take 1 tablet (100 mg total) by mouth 2 (two) times daily. 14 tablet 0  . furosemide (LASIX) 40 MG tablet Take 1 tablet (40 mg total) by mouth daily.    . hydrochlorothiazide (MICROZIDE) 12.5 MG capsule TAKE 1 CAPSULE(12.5 MG) BY MOUTH DAILY 90 capsule 1  . losartan (COZAAR) 50 MG tablet TAKE 1 TABLET(50 MG) BY MOUTH DAILY 90 tablet 1  . mupirocin ointment (BACTROBAN) 2 % Apply 1 Application topically 2 (two) times daily. 30 g 3  . neomycin-bacitracin-polymyxin [phone] OINT Apply 1 Application topically in the morning and at bedtime. 15 g 0  . pantoprazole (PROTONIX) 40 MG tablet TAKE 1 TABLET(40 MG) BY MOUTH DAILY 90 tablet 0  . PREMPRO 0.45-1.5 MG tablet TAKE 1 TABLET BY MOUTH DAILY 28 tablet 0   No current facility-administered medications for this visit.   No results found.   Review of Systems:   A ROS was performed including pertinent positives and negatives as documented in the HPI.   Musculoskeletal Exam:    There were no vitals taken for this visit.  Left hip 30 degrees internal/external rotation without pain.  Incision is well-appearing without erythema or drainage.  Distal neurosensory exam is intact  Imaging:      I personally reviewed and interpreted the radiographs.   Assessment:   16 weeks status post left hip gluteus medius repair.  At today's visit she is experiencing pain about the femoral acetabular joint.  Given this I would like to perform an intra-articular femoral acetabular injection I did discuss that it is common to have femoral acetabular type symptoms following a ligament repair.  Will plan for an injection and I will plan to see her back in 2 months to check her progress Plan :    -Return to clinic 8 weeks for reassessment   Left hip ultrasound-guided injection provided  verbal consent obtained    Procedure Note  Patient: Ellen [name]             Date of Birth: 18-Dec-1940           MRN: [id number]             Visit Date: 08/06/2023  Procedures: Visit Diagnoses: No diagnosis found.  Large Joint Inj: L hip joint on 08/06/2023 12:25 PM Indications: pain Details: 22 G 3.5 in needle, ultrasound-guided anterolateral approach  Arthrogram: No  Medications:  4 mL lidocaine 1 %; 80 mg triamcinolone acetonide 40 MG/ML Outcome: tolerated well, no immediate complications Procedure, treatment alternatives, risks and benefits explained, specific risks discussed. Consent was given by the patient. Immediately prior to procedure a time out was called to verify the correct patient, procedure, equipment, support staff and site/side marked as required. Patient was prepped and draped in the usual sterile fashion.       I personally saw and evaluated the patient, and participated in the management and treatment plan.  Huel Cote, MD Attending Physician, Orthopedic Surgery  This document was dictated using Dragon voice recognition software. A reasonable attempt at proof reading has been made to minimize errors.

## 2023-08-09 ENCOUNTER — Ambulatory Visit (HOSPITAL_BASED_OUTPATIENT_CLINIC_OR_DEPARTMENT_OTHER): Payer: Medicare Other | Admitting: Physical Therapy

## 2023-08-09 ENCOUNTER — Other Ambulatory Visit: Payer: Self-pay

## 2023-08-09 DIAGNOSIS — I872 Venous insufficiency (chronic) (peripheral): Secondary | ICD-10-CM

## 2023-08-11 ENCOUNTER — Encounter (HOSPITAL_BASED_OUTPATIENT_CLINIC_OR_DEPARTMENT_OTHER): Payer: Self-pay | Admitting: Orthopaedic Surgery

## 2023-08-12 ENCOUNTER — Ambulatory Visit (HOSPITAL_BASED_OUTPATIENT_CLINIC_OR_DEPARTMENT_OTHER): Payer: Medicare Other

## 2023-08-12 ENCOUNTER — Encounter (HOSPITAL_BASED_OUTPATIENT_CLINIC_OR_DEPARTMENT_OTHER): Payer: Self-pay

## 2023-08-12 ENCOUNTER — Other Ambulatory Visit (HOSPITAL_BASED_OUTPATIENT_CLINIC_OR_DEPARTMENT_OTHER): Payer: Self-pay | Admitting: Orthopaedic Surgery

## 2023-08-12 DIAGNOSIS — R262 Difficulty in walking, not elsewhere classified: Secondary | ICD-10-CM

## 2023-08-12 DIAGNOSIS — M6281 Muscle weakness (generalized): Secondary | ICD-10-CM | POA: Diagnosis not present

## 2023-08-12 DIAGNOSIS — M25552 Pain in left hip: Secondary | ICD-10-CM | POA: Diagnosis not present

## 2023-08-12 DIAGNOSIS — M25652 Stiffness of left hip, not elsewhere classified: Secondary | ICD-10-CM

## 2023-08-12 MED ORDER — METHYLPREDNISOLONE 4 MG PO TBPK: ORAL_TABLET | ORAL | 0 refills | Status: DC

## 2023-08-12 NOTE — Telephone Encounter (Signed)
 Is this for her knee or hip?

## 2023-08-12 NOTE — Telephone Encounter (Signed)
 We don't do them for the hip.

## 2023-08-12 NOTE — Telephone Encounter (Signed)
**Note De-identified  [place] Obfuscation** Please advise 

## 2023-08-12 NOTE — Therapy (Signed)
 OUTPATIENT PHYSICAL THERAPY TREATMENT         Patient Name: Ellen [name] MRN: [id number] DOB:[birth year], 83 y.o., female Today's Date: 08/13/2023    END OF SESSION:  PT End of Session - 08/12/23 1304     Visit Number 21    Number of Visits 26    Date for PT Re-Evaluation 08/23/23    Authorization Type MCR A & B    Progress Note Due on Visit 24    PT Start Time 1302    PT Stop Time 1349    PT Time Calculation (min) 47 min    Activity Tolerance Patient tolerated treatment well    Behavior During Therapy WFL for tasks assessed/performed                      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   Left Buccal Cheek (Keratoacanthoma)   Umbilical hernia    Past Surgical History:  Procedure Laterality Date   BOWEL RESECTION  07/09/2013   Procedure: SMALL BOWEL RESECTION;  Surgeon: Velora Heckler, MD;  Location: [facility];  Service: General;;   Brock Bad REPAIR Left 03/22/2023   Procedure: LEFT GLUTEUS MEDIUS REPAIR WITH POSSIBLE COLLAGEN PATCH AUGMENTATION;  Surgeon: Huel Cote, MD;  Location: [facility];  Service: Orthopedics;  Laterality: Left;   LAPAROTOMY N/A 07/09/2013   Procedure: EXPLORATORY LAPAROTOMY;  Surgeon: Velora Heckler, MD;  Location: [facility];  Service: General;  Laterality: N/A;   SHOULDER ARTHROSCOPY WITH CAPSULORRHAPHY     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   Hypokalemia 12/27/2022   Volume depletion 12/24/2022   Constipation 12/24/2022   Partial small bowel obstruction (HCC) 12/24/2022   Polycythemia 12/24/2022   Gastroesophageal reflux disease 08/26/2022    Bloating 08/26/2022   Small intestinal bacterial overgrowth (SIBO) 08/26/2022   Osteoarthritis 12/01/2021   Plantar wart 12/01/2021   Ventral hernia 01/01/2021   Diverticulosis 01/05/2019   Dermatitis 01/05/2019   Systolic murmur 07/25/2018   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   Hyperglycemia 10/05/2017   Leg edema 10/05/2017   Hammer toe 10/05/2017   Hot flashes due to menopause 10/05/2017   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/10/2013 07/11/2013   Hypertension 07/09/2013    REFERRING PROVIDER: Huel Cote, MD  REFERRING DIAG: [phone] (ICD-10-CM) - Tendinopathy of gluteus medius  S/p glute med repair  THERAPY DIAG:  Pain in left hip  Muscle weakness (generalized)  Stiffness of left hip, not elsewhere classified  Difficulty in walking, not elsewhere classified  Rationale for Evaluation and Treatment: Rehabilitation  ONSET DATE: DOS 03/22/2023  Days since surgery: 143   SUBJECTIVE:   SUBJECTIVE STATEMENT: Pt reports she saw Dr. Steward Drone on Friday who gave her an injection into L hip. She got relief for 2 days. " He said no dry needling." Ongoing catching in L hip. 6/10 pain level at entry.      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    PAIN:  NPRS:  3/10 current, 8/10 worst.  Worst pain is 1st thing in AM.  Location: L anterior hip  PRECAUTIONS: Other: per surgical protocol, Ulcer on L heel/post ankle/achilles, lumbar anterolisthesis, double hernia    WEIGHT BEARING RESTRICTIONS: Yes WBAT  FALLS:  Has patient fallen in last 6 months? No  LIVING ENVIRONMENT: Lives with: lives with their spouse Lives in: home; lives on 1 floor though does have to go  to basement. 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  Pt was ambulating without AD.    PATIENT GOALS: to walk straight and walk normally, improve strength  NEXT MD VISIT: 05/07/2023  OBJECTIVE:  Note: Objective measures were completed at Evaluation unless otherwise noted.   DIAGNOSTIC FINDINGS: Pt is post op.      TODAY'S TREATMENT:      08/12/23 Nustep L5 UE/LE's x Manual: STM to hip flexors/ rectus femoris, quads, adductors PROM L hip Manual stretching of L hip  Bridges 2x10 LAQ 3# 5" hold 2x10 Standing hip abduction 2x10 Standing hip extension 2x10 Standing march on airex x10 (started to have pain)   08/05/23 Nustep L5 UE/LE's x Manual: STM to hip flexors/ rectus femoris, quads, adductors PROM L hip Manual stretching of L hip    08/03/23 Nustep L5 UE/LE's x Manual: STM to hip flexors/ rectus femoris, quads Tall kneeling hip hinge 2 x 10 Mini squat lateral side out 2 x 10 Mini lunge 1 x 10  07/27/23 Nustep L5 UE/LE's x Manual: STM to hip flexors/ rectus femoris, quads Sidelying hip abduction 2 x 10, circles 2 x 10  Forward step up 4 inch 2 x 10 Lateral step up 4 inch 2 x 10 Standing hip extension with knee flexed x20 Tall kneeling hip hinge 2 x 10  07/23/23 Nustep L4 UE/LE's x Manual: STM to hip flexors/ rectus femoris, quads Thomas stretch Passive hip strengthning for adductors Sidelying hip abduction 3x10L  Standing hip stretches for adductors and flexors Forward step up 4 inch 2 x 10 Standing hip extension with knee flexed x20  Tall kneeling hip hinge 1 x 10 Gait in hall  07/21/23 Nustep L4 UE/LE's x 5 mins Manual: STM to hip flexors/ rectus femoris, quads Forward  step up 4 inch 1 x 10 Lateral step up 2 x 10  Standing hip extension RTB at knees 2 x 10  Tall kneeling hip hinge 1 x 10  2/27 Nustep L4 UE/LE's x 5 mins Hip hiking 2x10 bilat Lateral band walks with YTB above knees x 2 laps at rail with UE support Standing hip abd x10 bilat, YTB above knees x 10 reps bilat Step downs on 4 inch step 2x10 Step ups on 2 inch step 2x10 Standing marching with UE support 2x10 LAQ 2# x 10, 3# 2x10  PT updated HEP and gave pt a HEP handout.  PT educated pt in correct form and appropriate frequency.  PT instructed pt she should not have pain with HEP.       PATIENT EDUCATION:  Education details:  dx, relevant  anatomy, HEP, POC, exercise form, rationale of interventions, and post op and protocol restrictions/limitations.  06/09/23: reassessment findings Person educated: Patient Education method: Explanation, Demonstration, Tactile cues, Verbal cues, and Handouts Education comprehension: verbalized understanding, returned demonstration, verbal cues required, tactile cues required, and needs further education  HOME EXERCISE PROGRAM: Access Code: [email or web address] URL: [email or web address] Date: 04/14/2023 Prepared by: Aaron Edelman  Updated HEP: - Side Stepping with Counter Support  - 1 x daily - 5-6 x weekly - 2 sets - 10 reps - Standing Hip Hiking  - 1 x daily - 5 x weekly - 2 sets - 10 reps - Standing Hip Abduction with Counter Support  - 1 x daily - 5 x weekly - 2 sets - 10 reps - Standing March with Counter Support  - 1 x daily - 6-7 x weekly - 2 sets - 10 reps  ASSESSMENT:  CLINICAL IMPRESSION: Pt remains tender and with soft tissue restrictions within L hip adductors and groin area. Continued to work on [employer] to address these. Pt able to tolerate gentle strengthening today. Held most WB tasks. With marches on airex, she did not have pain initially, but did report lateral L hip pain after first set, so did not continue. Will continue to monitor  pain level and progress as tolerated.     OBJECTIVE IMPAIRMENTS: Abnormal gait, decreased activity tolerance, decreased endurance, decreased mobility, difficulty walking, decreased ROM, decreased strength, hypomobility, and pain.   ACTIVITY LIMITATIONS: carrying, lifting, standing, squatting, stairs, transfers, and locomotion level  PARTICIPATION LIMITATIONS: cleaning and yard work  PERSONAL FACTORS: 3+ comorbidities: L LE ulcer, double hernia, arthritis  are also affecting patient's functional outcome.   REHAB POTENTIAL: Good  CLINICAL DECISION MAKING: Stable/uncomplicated  EVALUATION COMPLEXITY: Low   GOALS:   SHORT TERM GOALS:  Pt will be independent and compliant with HEP for improved pain, strength, and function. Baseline: Goal status: MET Target date:  05/05/2023   2.  Pt will demo improved quality of gait with increased stance time and toe off on L.   Baseline:  Goal status:  GOAL MET  07/12/23 Target date:  05/12/2023  3.  Pt will progress with exercises per protocol without adverse effects for improved strength and mobility.   Baseline:  Goal status: GOAL MET  07/12/23 Target date:  05/26/2023   4.  Pt will report improved tolerance with standing activities and ambulation.  Baseline:  Goal status:  GOAL MET  07/12/23 Target date:  06/16/2023   5.  Pt will ambulate with a normalized heel to toe gait without limping.  Baseline:  Goal status:  PROGRESSING  07/12/23 Target date:  06/23/2023   6.  Pt will be able to perform a 6 inch step up with good form and control with L LE leading for improved performance of stairs abd and improved functional strength. Baseline:  Goal status: NOT MET  07/12/23 Target date:  06/30/2023   LONG TERM GOALS: Target date: 08/23/2023   Pt will be able to ambulate extended community distance without significant difficulty and pain.   Baseline:  Goal status: ONGOING   2.  Pt will be able to perform her ADLs and IADLs including  household chores without significant difficulty and pain. Baseline:  Goal status: ONGOING  3.  Pt will be able to take care of her husband without significant limitations.   Baseline:  Goal status:  GOAL MET  07/12/23  4.  Pt will be able to perform mini squats with good form and  without increased pain in order for improved functional LE strength and to assist with returning to gardening activities.   Baseline:  Goal status: PROGRESSING      PLAN:  PT FREQUENCY:  2x/wk  PT DURATION: 6 weeks  PLANNED INTERVENTIONS: 97164- PT Re-evaluation, 97110-Therapeutic exercises, 97530- Therapeutic activity, [id number]- Neuromuscular re-education, 97535- Self Care, [id number]- Manual therapy, [id number]- Gait training, [phone]- Aquatic Therapy, 97014- Electrical stimulation (unattended), [phone]- Ultrasound, Patient/Family education, Balance training, Stair training, Taping, Dry Needling, Joint mobilization, Scar mobilization, Cryotherapy, and Moist heat  PLAN FOR NEXT SESSION:  Cont per Dr. Serena Croissant gluteus medius repair.  Be aware of healing ulcer in distal L LE at post heel/ankle.      Donnel Saxon Jordynn Perrier, PTA 08/13/2023, 7:48 AM

## 2023-08-12 NOTE — Telephone Encounter (Signed)
 Left hip please

## 2023-08-12 NOTE — Telephone Encounter (Signed)
Please get auth thank you 

## 2023-08-13 NOTE — Telephone Encounter (Signed)
**Note De-identified  [place] Obfuscation** Please advise 

## 2023-08-16 NOTE — Telephone Encounter (Signed)
 Called # to get rep for our area. They are going to call me back

## 2023-08-16 NOTE — Telephone Encounter (Signed)
 Do either of you guys have this information?

## 2023-08-16 NOTE — Telephone Encounter (Signed)
 I just have this number that you can try and see what information they can provide you. [phone].

## 2023-08-17 ENCOUNTER — Ambulatory Visit (HOSPITAL_BASED_OUTPATIENT_CLINIC_OR_DEPARTMENT_OTHER): Payer: Medicare Other | Attending: Orthopaedic Surgery | Admitting: Physical Therapy

## 2023-08-18 NOTE — Telephone Encounter (Signed)
 Per Dr. Steward Drone he was able to find a rep

## 2023-08-18 NOTE — Therapy (Unsigned)
 OUTPATIENT PHYSICAL THERAPY TREATMENT         Patient Name: Ellen [name] MRN: [id number] DOB:June 06, 1940, 83 y.o., female Today's Date: 08/20/2023    END OF SESSION:  PT End of Session - 08/19/23 1325     Visit Number 22    Number of Visits 26    Date for PT Re-Evaluation 08/23/23    Authorization Type MCR A & B    PT Start Time 1320    PT Stop Time 1400    PT Time Calculation (min) 40 min    Activity Tolerance Patient tolerated treatment well    Behavior During Therapy WFL for tasks assessed/performed                       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   Left Buccal Cheek (Keratoacanthoma)   Umbilical hernia    Past Surgical History:  Procedure Laterality Date   BOWEL RESECTION  07/09/2013   Procedure: SMALL BOWEL RESECTION;  Surgeon: Velora Heckler, MD;  Location: [facility];  Service: General;;   Brock Bad REPAIR Left 03/22/2023   Procedure: LEFT GLUTEUS MEDIUS REPAIR WITH POSSIBLE COLLAGEN PATCH AUGMENTATION;  Surgeon: Huel Cote, MD;  Location: [facility];  Service: Orthopedics;  Laterality: Left;   LAPAROTOMY N/A 07/09/2013   Procedure: EXPLORATORY LAPAROTOMY;  Surgeon: Velora Heckler, MD;  Location: [facility];  Service: General;  Laterality: N/A;   SHOULDER ARTHROSCOPY WITH CAPSULORRHAPHY     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   Hypokalemia 12/27/2022   Volume depletion 12/24/2022   Constipation 12/24/2022   Partial small bowel obstruction (HCC) 12/24/2022   Polycythemia 12/24/2022   Gastroesophageal reflux disease 08/26/2022   Bloating 08/26/2022   Small intestinal  bacterial overgrowth (SIBO) 08/26/2022   Osteoarthritis 12/01/2021   Plantar wart 12/01/2021   Ventral hernia 01/01/2021   Diverticulosis 01/05/2019   Dermatitis 01/05/2019   Systolic murmur 07/25/2018   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   Hyperglycemia 10/05/2017   Leg edema 10/05/2017   Hammer toe 10/05/2017   Hot flashes due to menopause 10/05/2017   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/10/2013 07/11/2013   Hypertension 07/09/2013    REFERRING PROVIDER: Huel Cote, MD  REFERRING DIAG: [id number] (ICD-10-CM) - Tendinopathy of gluteus medius  S/p glute med repair  THERAPY DIAG:  Pain in left hip  Muscle weakness (generalized)  Stiffness of left hip, not elsewhere classified  Difficulty in walking, not elsewhere classified  Rationale for Evaluation and Treatment: Rehabilitation  ONSET DATE: DOS 03/22/2023  Days since surgery: 150   SUBJECTIVE:   SUBJECTIVE STATEMENT: Pt is 21 weeks and 3 days s/p Left hip gluteus medius repair and trochanteric bursectomy.  Pt is still hurting.  Pt received 2 days of relief from the injection on 3/21.  Pt has messaged Dr. Steward Drone.  He is trying to get a longer acting steroid to reduce inflammation.  Pt denies any adverse effects after prior Rx.  Pt is having diagnostic testing for circulation in her legs tomorrow.  She  sees vascular MD on Monday.  Pt states the ulcer at her heel has not healed.    Pt has difficulty walking due to L hip pain.    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    PAIN:  NPRS:  4/10 current, 8/10 worst.  Worst pain is 1st thing in AM.  Location: L lateral hip  PRECAUTIONS: Other: per surgical protocol,  Ulcer on L heel/post ankle/achilles, lumbar anterolisthesis, double hernia    WEIGHT BEARING RESTRICTIONS: Yes WBAT  FALLS:  Has patient fallen in last 6 months? No  LIVING ENVIRONMENT: Lives with: lives with their spouse Lives in: home; lives on 1 floor though does have to go to basement. 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  Pt was ambulating without AD.    PATIENT GOALS: to walk straight and walk normally, improve strength  NEXT MD VISIT: 05/07/2023  OBJECTIVE:  Note: Objective measures were completed at Evaluation unless otherwise noted.   DIAGNOSTIC FINDINGS: Pt is post op.      TODAY'S TREATMENT:     08/19/23 Nustep L4-5 x 5 mins bilat UE/LE Pt received L hip PROM in flexion, abd, ER, and IR per pt tolerance and tissue tolerance Manual modified thomas hip flexor stretch 2x30 sec LAQ 3# 2x10 Standing hip abd 2x10 L, 1x10 R LE Sidestepping x 2 laps with UE support on rail Step downs x10  2 inch step  08/12/23 Nustep L5 UE/LE's x Manual: STM to hip flexors/ rectus femoris, quads, adductors PROM L hip Manual stretching of L hip  Bridges 2x10 LAQ 3# 5" hold 2x10 Standing hip abduction 2x10 Standing hip extension 2x10 Standing march on airex x10 (started to have pain)   08/05/23 Nustep L5 UE/LE's x Manual: STM to hip flexors/ rectus femoris, quads, adductors PROM L hip Manual stretching of L hip    08/03/23 Nustep L5 UE/LE's x Manual: STM to hip flexors/ rectus femoris, quads Tall kneeling hip hinge 2 x 10 Mini squat lateral side out 2 x 10 Mini lunge 1 x 10  07/27/23 Nustep L5 UE/LE's x Manual: STM to hip flexors/ rectus femoris, quads Sidelying hip abduction 2 x 10,  circles 2 x 10  Forward step up 4 inch 2 x 10 Lateral step up 4 inch 2 x 10 Standing hip extension with knee flexed x20 Tall kneeling hip hinge 2 x 10  07/23/23 Nustep L4 UE/LE's x Manual: STM to hip flexors/ rectus femoris, quads Thomas stretch Passive hip strengthning for adductors Sidelying hip abduction 3x10L  Standing hip stretches for adductors and flexors Forward step up 4 inch 2 x 10 Standing hip extension with knee flexed x20  Tall kneeling hip hinge 1 x 10 Gait in hall  07/21/23 Nustep L4 UE/LE's x 5 mins Manual: STM to hip flexors/ rectus femoris, quads Forward step up 4 inch 1 x 10 Lateral step up 2 x 10  Standing hip extension RTB at knees 2 x 10  Tall kneeling  hip hinge 1 x 10       PATIENT EDUCATION:  Education details:  dx, relevant anatomy, HEP, POC, exercise form, rationale of interventions, ice usage, and post op and protocol restrictions/limitations. Person educated: Patient Education method: Explanation, Demonstration, Tactile cues, Verbal cues, and Handouts Education comprehension: verbalized understanding, returned demonstration, verbal cues required, tactile cues required, and needs further education  HOME EXERCISE PROGRAM: Access Code: [email or web address] URL: [email or web address] Date: 04/14/2023 Prepared by: Aaron Edelman  Updated HEP: - Side Stepping with Counter Support  - 1 x daily - 5-6 x weekly - 2 sets - 10 reps - Standing Hip Hiking  - 1 x daily - 5 x weekly - 2 sets - 10 reps - Standing Hip Abduction with Counter Support  - 1 x daily - 5 x weekly - 2 sets - 10 reps - Standing March with Counter Support  - 1 x daily - 6-7 x weekly - 2 sets - 10 reps  ASSESSMENT:  CLINICAL IMPRESSION: Pt continues to have pain and has difficulty with walking.   Pt tolerated hip PROM well.  PT attempting to slowly progress with exercises and pt performed exercises well.  Pt performed sidestepping without resistance and did well.  Pt had pain  with attempting step downs on 4 inch step and PT had pt stop.  Pt performed step downs on 2 inch step and felt fine.  She reports increased pain from 4/10 before Rx to 5-6/10 after Rx.  PT instructed pt in using ice to reduce soreness and pain.   OBJECTIVE IMPAIRMENTS: Abnormal gait, decreased activity tolerance, decreased endurance, decreased mobility, difficulty walking, decreased ROM, decreased strength, hypomobility, and pain.   ACTIVITY LIMITATIONS: carrying, lifting, standing, squatting, stairs, transfers, and locomotion level  PARTICIPATION LIMITATIONS: cleaning and yard work  PERSONAL FACTORS: 3+ comorbidities: L LE ulcer, double hernia, arthritis  are also affecting patient's functional outcome.   REHAB POTENTIAL: Good  CLINICAL DECISION MAKING: Stable/uncomplicated  EVALUATION COMPLEXITY: Low   GOALS:   SHORT TERM GOALS:  Pt will be independent and compliant with HEP for improved pain, strength, and function. Baseline: Goal status: MET Target date:  05/05/2023   2.  Pt will demo improved quality of gait with increased stance time and toe off on L.   Baseline:  Goal status:  GOAL MET  07/12/23 Target date:  05/12/2023  3.  Pt will progress with exercises per protocol without adverse effects for improved strength and mobility.   Baseline:  Goal status: GOAL MET  07/12/23 Target date:  05/26/2023   4.  Pt will report improved tolerance with standing activities and ambulation.  Baseline:  Goal status:  GOAL MET  07/12/23 Target date:  06/16/2023   5.  Pt will ambulate with a normalized heel to toe gait without limping.  Baseline:  Goal status:  PROGRESSING  07/12/23 Target date:  06/23/2023   6.  Pt will be able to perform a 6 inch step up with good form and control with L LE leading for improved performance of stairs abd and improved functional strength. Baseline:  Goal status: NOT MET  07/12/23 Target date:  06/30/2023   LONG TERM GOALS: Target date:  08/23/2023   Pt will be able to ambulate extended community distance without significant difficulty and pain.   Baseline:  Goal status: ONGOING   2.  Pt will be able to perform her ADLs and IADLs including household chores without significant difficulty and pain. Baseline:  Goal status: ONGOING  3.  Pt will be able to take care of her husband without significant limitations.   Baseline:  Goal status:  GOAL MET  07/12/23  4.  Pt will be able to perform mini squats with good form and without increased pain in order for improved functional LE strength and to assist with returning to gardening activities.   Baseline:  Goal status: PROGRESSING      PLAN:  PT FREQUENCY:  2x/wk  PT DURATION: 6 weeks  PLANNED INTERVENTIONS: 97164- PT Re-evaluation, 97110-Therapeutic exercises, 97530- Therapeutic activity, [id number]- Neuromuscular re-education, 97535- Self Care, [id number]- Manual therapy, [id number]- Gait training, [phone]- Aquatic Therapy, 97014- Electrical stimulation (unattended), [phone]- Ultrasound, Patient/Family education, Balance training, Stair training, Taping, Dry Needling, Joint mobilization, Scar mobilization, Cryotherapy, and Moist heat  PLAN FOR NEXT SESSION:  Cont per Dr. Serena Croissant gluteus medius repair.  Be aware of healing ulcer in distal L LE at post heel/ankle.    Audie Clear III PT, DPT 08/20/23 1:37 PM

## 2023-08-19 ENCOUNTER — Encounter (HOSPITAL_BASED_OUTPATIENT_CLINIC_OR_DEPARTMENT_OTHER): Payer: Self-pay | Admitting: Physical Therapy

## 2023-08-19 ENCOUNTER — Ambulatory Visit (HOSPITAL_BASED_OUTPATIENT_CLINIC_OR_DEPARTMENT_OTHER): Payer: Medicare Other | Attending: Orthopaedic Surgery | Admitting: Physical Therapy

## 2023-08-19 DIAGNOSIS — R262 Difficulty in walking, not elsewhere classified: Secondary | ICD-10-CM | POA: Insufficient documentation

## 2023-08-19 DIAGNOSIS — M25652 Stiffness of left hip, not elsewhere classified: Secondary | ICD-10-CM | POA: Diagnosis not present

## 2023-08-19 DIAGNOSIS — M25552 Pain in left hip: Secondary | ICD-10-CM | POA: Diagnosis not present

## 2023-08-19 DIAGNOSIS — M6281 Muscle weakness (generalized): Secondary | ICD-10-CM | POA: Diagnosis not present

## 2023-08-20 ENCOUNTER — Ambulatory Visit (HOSPITAL_COMMUNITY)
Admission: RE | Admit: 2023-08-20 | Discharge: 2023-08-20 | Disposition: A | Discharge status: Home / Self Care | Source: Ambulatory Visit | Attending: Vascular Surgery | Admitting: Vascular Surgery

## 2023-08-20 DIAGNOSIS — I872 Venous insufficiency (chronic) (peripheral): Secondary | ICD-10-CM | POA: Diagnosis not present

## 2023-08-23 ENCOUNTER — Ambulatory Visit (INDEPENDENT_AMBULATORY_CARE_PROVIDER_SITE_OTHER): Admitting: Physician Assistant

## 2023-08-23 VITALS — BP 157/76 | HR 75 | Temp 97.5°F | Ht 61.0 in | Wt 111.7 lb

## 2023-08-23 DIAGNOSIS — L97301 Non-pressure chronic ulcer of unspecified ankle limited to breakdown of skin: Secondary | ICD-10-CM | POA: Diagnosis not present

## 2023-08-23 DIAGNOSIS — I872 Venous insufficiency (chronic) (peripheral): Secondary | ICD-10-CM

## 2023-08-23 NOTE — Progress Notes (Signed)
 [facility] [place]     History of Present Illness  Ellen [name] is a 83 y.o. female who presents with chief complaint: swollen legs with new Bilateral achillis ulcers.  She was hospitalized back in August of 2024.  She was bed ridden for 10 days.  Once she got home she noticed posterior achillis area pain and developed non healing wounds.  Her primary care physician has her using topical mupirocin, oral antibiotics for bouts of cellulitis and compression socks.  They have tried conservative measures plus Lasix for the last several months.  She had ABIs which were normal B LE.  She stays very busy and does not take time to elevate her legs or wear the compression currently due to pain.    She was seen in our office in the past for edema and hyperpigmentation of bilateral lower extremities. Her venous reflux study was negative at that time in 2024.  She was placed in a conservative treatment plan of care.  Compression, elevation and exercise.         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   Left Buccal Cheek (Keratoacanthoma)   Umbilical hernia     Past Surgical History:  Procedure Laterality Date   BOWEL RESECTION  07/09/2013   Procedure: SMALL BOWEL RESECTION;  Surgeon: Velora Heckler, MD;  Location: [facility];  Service: General;;   Brock Bad REPAIR Left 03/22/2023   Procedure: LEFT GLUTEUS MEDIUS REPAIR WITH POSSIBLE COLLAGEN PATCH AUGMENTATION;  Surgeon: Huel Cote, MD;  Location: [facility];  Service: Orthopedics;  Laterality: Left;   LAPAROTOMY N/A 07/09/2013   Procedure: EXPLORATORY LAPAROTOMY;  Surgeon: Velora Heckler, MD;  Location: [facility];  Service: General;  Laterality: N/A;   SHOULDER ARTHROSCOPY WITH CAPSULORRHAPHY      Social History    Socioeconomic History   Marital status: Married    Spouse name: Not on file   Number of children: 0   Years of education: Not on file   Highest education level: Not on file  Occupational History   Occupation: Retired   Tobacco Use   Smoking status: Former    Types: Cigarettes   Smokeless tobacco: Never  Vaping Use   Vaping status: Never Used  Substance and Sexual Activity   Alcohol use: Yes    Alcohol/week: 7.0 standard drinks of alcohol    Types: 7 Glasses of wine per week    Comment: nightly glass of wine   Drug use: No   Sexual activity: Not Currently  Other Topics Concern   Not on file  Social History Narrative   Enjoys gardening    Social Drivers of Health   Financial Resource Strain: Low Risk  (07/22/2023)   Overall Financial Resource Strain (CARDIA)    Difficulty of Paying Living Expenses: Not hard at all  Food Insecurity: No Food Insecurity (07/22/2023)   Hunger Vital Sign    Worried About Running Out of Food in the Last Year: Never true    Ran Out of Food in the Last Year: Never true  Transportation Needs: No Transportation Needs (07/22/2023)   PRAPARE - Administrator, Civil Service (Medical): No    Lack of Transportation (Non-Medical):  No  Physical Activity: Sufficiently Active (07/22/2023)   Exercise Vital Sign    Days of Exercise per Week: 5 days    Minutes of Exercise per Session: 120 min  Stress: No Stress Concern Present (07/22/2023)   [employer] of Occupational Health - Occupational Stress Questionnaire    Feeling of Stress : Not at all  Social Connections: Moderately Integrated (07/22/2023)   Social Connection and Isolation Panel [NHANES]    Frequency of Communication with Friends and Family: Once a week    Frequency of Social Gatherings with Friends and Family: More than three times a week    Attends Religious Services: More than 4 times per year    Active Member of [employer] or Organizations: No    Attends Banker Meetings:  Never    Marital Status: Married  Catering manager Violence: Not At Risk (07/22/2023)   Humiliation, Afraid, Rape, and Kick questionnaire    Fear of Current or Ex-Partner: No    Emotionally Abused: No    Physically Abused: No    Sexually Abused: No    Family History  Problem Relation Age of Onset   Diabetes Mother    Heart attack Mother    CAD Father    Heart attack Father    Breast cancer Neg Hx    Colon cancer Neg Hx    Esophageal cancer Neg Hx    Rectal cancer Neg Hx    Stomach cancer Neg Hx     Current Outpatient Medications on File Prior to Visit  Medication Sig Dispense Refill   Calcium Carbonate Antacid (TUMS PO) Take 1 tablet by mouth as needed (reflux).     Carboxymethylcellulose Sodium (EYE DROPS OP) Place 2 drops into both eyes daily. Thera Tears     doxycycline (VIBRA-TABS) 100 MG tablet Take 1 tablet (100 mg total) by mouth 2 (two) times daily. 14 tablet 0   furosemide (LASIX) 40 MG tablet Take 1 tablet (40 mg total) by mouth daily.     hydrochlorothiazide (MICROZIDE) 12.5 MG capsule TAKE 1 CAPSULE(12.5 MG) BY MOUTH DAILY 90 capsule 1   losartan (COZAAR) 50 MG tablet TAKE 1 TABLET(50 MG) BY MOUTH DAILY 90 tablet 1   mupirocin ointment (BACTROBAN) 2 % Apply 1 Application topically 2 (two) times daily. 30 g 3   neomycin-bacitracin-polymyxin [phone] OINT Apply 1 Application topically in the morning and at bedtime. 15 g 0   pantoprazole (PROTONIX) 40 MG tablet TAKE 1 TABLET(40 MG) BY MOUTH DAILY 90 tablet 0   PREMPRO 0.45-1.5 MG tablet TAKE 1 TABLET BY MOUTH DAILY 28 tablet 0   No current facility-administered medications on file prior to visit.    Allergies as of 08/23/2023 - Review Complete 08/23/2023  Allergen Reaction Noted   Lactose intolerance (gi) Diarrhea 03/10/2014   Sulfa antibiotics Nausea And Vomiting and Swelling 07/09/2013     ROS:   General:  No weight loss, Fever, chills  HEENT: No recent headaches, no nasal bleeding, no visual changes, no  sore throat  Neurologic: No dizziness, blackouts, seizures. No recent symptoms of stroke or mini- stroke. No recent episodes of slurred speech, or temporary blindness.  Cardiac: No recent episodes of chest pain/pressure, no shortness of breath at rest.  No shortness of breath with exertion.  Denies history of atrial fibrillation or irregular heartbeat  Vascular: No history of rest pain in feet.  No history of claudication.  positive history of non-healing ulcer, No history of DVT   Pulmonary: No home  oxygen, no productive cough, no hemoptysis,  No asthma or wheezing  Musculoskeletal:  [ ]  Arthritis, [ ]  Low back pain,  [ ]  Joint pain  Hematologic:No history of hypercoagulable state.  No history of easy bleeding.  No history of anemia  Gastrointestinal: No hematochezia or melena,  No gastroesophageal reflux, no trouble swallowing  Urinary: [ ]  chronic Kidney disease, [ ]  on HD - [ ]  MWF or [ ]  TTHS, [ ]  Burning with urination, [ ]  Frequent urination, [ ]  Difficulty urinating;   Skin: No rashes  Psychological: No history of anxiety,  No history of depression  Physical Examination  Vitals:   08/23/23 0922  BP: (!) 157/76  Pulse: 75  Temp: (!) 97.5 F (36.4 C)  TempSrc: Temporal  SpO2: 96%  Weight: 111 lb 11.2 oz (50.7 kg)  Height: 5\' 1"  (1.549 m)    Body mass index is 21.11 kg/m.  General:  Alert and oriented, no acute distress HEENT: Normal Neck: No bruit or JVD Pulmonary: Clear to auscultation bilaterally Cardiac: Regular Rate and Rhythm without murmur Abdomen: Soft, non-tender, non-distended, no mass, no scars Skin: No rash      Dry 4 x 4 debridement to remove yellow eschar, tolerated with some pain.   Extremity Pulses:radial,  femoral, dorsalis pedis, posterior tibial pulses bilaterally Musculoskeletal: No deformity pitting edema B LE Neurologic: Upper and lower extremity motor grossly intact and symmetric  DATA: Venous Reflux Times   +--------------+---------+------+-----------+------------+--------+  RIGHT        Reflux NoRefluxReflux TimeDiameter cmsComments                          Yes                                   +--------------+---------+------+-----------+------------+--------+  CFV                    yes   >1 second                       +--------------+---------+------+-----------+------------+--------+  FV mid        no                                              +--------------+---------+------+-----------+------------+--------+  Popliteal    no                                              +--------------+---------+------+-----------+------------+--------+  GSV at [facility]    no                            0.61              +--------------+---------+------+-----------+------------+--------+  GSV prox thighno                            0.36              +--------------+---------+------+-----------+------------+--------+  GSV mid thigh no  0.25              +--------------+---------+------+-----------+------------+--------+  GSV dist thigh          yes    >500 ms      0.17              +--------------+---------+------+-----------+------------+--------+  GSV at knee   no                            0.22              +--------------+---------+------+-----------+------------+--------+  GSV prox calf no                            0.23              +--------------+---------+------+-----------+------------+--------+  SSV Pop Fossa no                            0.18              +--------------+---------+------+-----------+------------+--------+  SSV prox calf no                            0.26              +--------------+---------+------+-----------+------------+--------+  SSV mid calf  no                            0.26               +--------------+---------+------+-----------+------------+--------+  AASV O                                              NV        +--------------+---------+------+-----------+------------+--------+     Summary:  Right:  - No evidence of deep vein thrombosis seen in the right lower extremity,  from the common femoral through the popliteal veins.  - No evidence of superficial venous reflux seen in the right short  saphenous vein.  - Venous reflux is noted in the right common femoral vein.  - Venous reflux is noted in the right greater saphenous vein in the thigh.   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    Bilateral achillis area non healing ulcers.  She has tried limited conservative treatment with on/off compression, minimal if any elevation and topical crease/antibiotics.  She has also had oral antibiotics.  At one time she did try una boots but did like them and cut them off herself.     Her reflux study shows minimal reflux in the distal GSV.   She has palpable pedal pulses B LE.  I will place her in B una boots with conservative treatment to include supine elevation 15-20 minuets 3 times a day.  She was given a handout to demonstrate the best elevation.    She will f/u in 1 week for una boot changes and then she will see the wound care center for the first time on 09/07/23.    Mosetta Pigeon PA-C [facility] [place] Office: [phone]   MD in clinic [place]

## 2023-08-30 ENCOUNTER — Ambulatory Visit: Admitting: Physician Assistant

## 2023-08-30 VITALS — BP 163/79 | HR 76 | Temp 98.0°F | Ht 61.0 in | Wt 113.1 lb

## 2023-08-30 DIAGNOSIS — L97301 Non-pressure chronic ulcer of unspecified ankle limited to breakdown of skin: Secondary | ICD-10-CM

## 2023-08-30 DIAGNOSIS — I872 Venous insufficiency (chronic) (peripheral): Secondary | ICD-10-CM | POA: Diagnosis not present

## 2023-08-30 NOTE — Progress Notes (Signed)
 Office Note     CC:  follow up Requesting Provider:  Ardith Dark, MD  HPI: Ellen [name] is a 83 y.o. ([birth year]) female who presents for follow up wound care for bilateral lower extremity ulcers. She was last seen on 08/23/23 and placed in unna boots. However, she says she tolerated the unna boots for about 3 days and then cut them off.  She says they became too painful. She currently has a lot of burning pain. She does feel they are getting better though.  She has been using a combination of topical ointments on the wounds in the interim. She has been elevating and wearing compression stockings. 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   Left Buccal Cheek (Keratoacanthoma)   Umbilical hernia     Past Surgical History:  Procedure Laterality Date   BOWEL RESECTION  07/09/2013   Procedure: SMALL BOWEL RESECTION;  Surgeon: Velora Heckler, MD;  Location: [facility];  Service: General;;   [place]rock Bad REPAIR Left 03/22/2023   Procedure: LEFT GLUTEUS MEDIUS REPAIR WITH POSSIBLE COLLAGEN PATCH AUGMENTATION;  Surgeon: Huel Cote, MD;  Location: [facility];  Service: Orthopedics;  Laterality: Left;   LAPAROTOMY N/A 07/09/2013   Procedure: EXPLORATORY LAPAROTOMY;  Surgeon: Velora Heckler, MD;  Location: [facility];  Service: General;  Laterality: N/A;   SHOULDER ARTHROSCOPY WITH CAPSULORRHAPHY      Social History   Socioeconomic History   Marital status: Married    Spouse name: Not on file   Number of children: 0   Years of education: Not on file   Highest education level: Not on file  Occupational History   Occupation: Retired   Tobacco Use   Smoking status: Former    Types: Cigarettes    Smokeless tobacco: Never  Vaping Use   Vaping status: Never Used  Substance and Sexual Activity   Alcohol use: Yes    Alcohol/week: 7.0 standard drinks of alcohol    Types: 7 Glasses of wine per week    Comment: nightly glass of wine   Drug use: No   Sexual activity: Not Currently  Other Topics Concern   Not on file  Social History Narrative   Enjoys gardening    Social Drivers of Health   Financial Resource Strain: Low Risk  (07/22/2023)   Overall Financial Resource Strain (CARDIA)    Difficulty of Paying Living Expenses: Not hard at all  Food Insecurity: No Food Insecurity (07/22/2023)   Hunger Vital Sign    Worried About Running Out of Food in the Last Year: Never true    Ran Out of Food in the Last Year: Never true  Transportation Needs: No Transportation Needs (07/22/2023)   PRAPARE - Administrator, Civil Service (Medical): No    Lack of Transportation (Non-Medical): No  Physical Activity: Sufficiently Active (07/22/2023)   Exercise Vital Sign    Days of Exercise per Week: 5 days    Minutes of Exercise per Session: 120 min  Stress: No Stress Concern Present (07/22/2023)   [employer] of Occupational Health - Occupational Stress Questionnaire  Feeling of Stress : Not at all  Social Connections: Moderately Integrated (07/22/2023)   Social Connection and Isolation Panel [NHANES]    Frequency of Communication with Friends and Family: Once a week    Frequency of Social Gatherings with Friends and Family: More than three times a week    Attends Religious Services: More than 4 times per year    Active Member of [employer] or Organizations: No    Attends Banker Meetings: Never    Marital Status: Married  Catering manager Violence: Not At Risk (07/22/2023)   Humiliation, Afraid, Rape, and Kick questionnaire    Fear of Current or Ex-Partner: No    Emotionally Abused: No    Physically Abused: No    Sexually Abused: No    Family History  Problem Relation  Age of Onset   Diabetes Mother    Heart attack Mother    CAD Father    Heart attack Father    Breast cancer Neg Hx    Colon cancer Neg Hx    Esophageal cancer Neg Hx    Rectal cancer Neg Hx    Stomach cancer Neg Hx     Current Outpatient Medications  Medication Sig Dispense Refill   Calcium Carbonate Antacid (TUMS PO) Take 1 tablet by mouth as needed (reflux).     Carboxymethylcellulose Sodium (EYE DROPS OP) Place 2 drops into both eyes daily. Thera Tears     doxycycline (VIBRA-TABS) 100 MG tablet Take 1 tablet (100 mg total) by mouth 2 (two) times daily. 14 tablet 0   furosemide (LASIX) 40 MG tablet Take 1 tablet (40 mg total) by mouth daily.     hydrochlorothiazide (MICROZIDE) 12.5 MG capsule TAKE 1 CAPSULE(12.5 MG) BY MOUTH DAILY 90 capsule 1   losartan (COZAAR) 50 MG tablet TAKE 1 TABLET(50 MG) BY MOUTH DAILY 90 tablet 1   mupirocin ointment (BACTROBAN) 2 % Apply 1 Application topically 2 (two) times daily. 30 g 3   neomycin-bacitracin-polymyxin [phone] OINT Apply 1 Application topically in the morning and at bedtime. 15 g 0   pantoprazole (PROTONIX) 40 MG tablet TAKE 1 TABLET(40 MG) BY MOUTH DAILY 90 tablet 0   PREMPRO 0.45-1.5 MG tablet TAKE 1 TABLET BY MOUTH DAILY 28 tablet 0   No current facility-administered medications for this visit.    Allergies  Allergen Reactions   Lactose Intolerance (Gi) Diarrhea   Sulfa Antibiotics Nausea And Vomiting and Swelling     REVIEW OF SYSTEMS:  [X]  denotes positive finding, [ ]  denotes negative finding Cardiac  Comments:  Chest pain or chest pressure:    Shortness of breath upon exertion:    Short of breath when lying flat:    Irregular heart rhythm:        Vascular    Pain in calf, thigh, or hip brought on by ambulation:    Pain in feet at night that wakes you up from your sleep:     Blood clot in your veins:    Leg swelling:         Pulmonary    Oxygen at home:    Productive cough:     Wheezing:          Neurologic    Sudden weakness in arms or legs:     Sudden numbness in arms or legs:     Sudden onset of difficulty speaking or slurred speech:    Temporary loss of vision in one eye:     Problems with  dizziness:         Gastrointestinal    Blood in stool:     Vomited blood:         Genitourinary    Burning when urinating:     Blood in urine:        Psychiatric    Major depression:         Hematologic    Bleeding problems:    Problems with blood clotting too easily:        Skin    Rashes or ulcers:        Constitutional    Fever or chills:      PHYSICAL EXAMINATION:  Vitals:   08/30/23 0925  BP: (!) 163/79  Pulse: 76  Temp: 98 F (36.7 C)  TempSrc: Temporal  SpO2: 98%  Weight: 113 lb 1.6 oz (51.3 kg)  Height: 5\' 1"  (1.549 m)    General:  WDWN in NAD; vital signs documented above Gait: Not observed HENT: WNL, normocephalic Pulmonary: normal non-labored breathing without wheezing Cardiac: regular HR Abdomen: soft Extremities: 2+ DP pulses bilaterally, without ischemic changes, without Gangrene , without cellulitis; without open wounds;     Bilateral ulcerations with surrounding skin sloughing overlying her achilles  Musculoskeletal: no muscle wasting or atrophy  Neurologic: A&O X 3 Psychiatric:  The pt has Normal affect.   ASSESSMENT/PLAN:: 83 y.o. 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 She has been unable to tolerate unna boots. Topical ointments, compression and elevation have been helping her wounds improve. She has wound care appointment on 09/07/23.  - She has triphasic flow and normal ABIs bilaterally. No significant arterial disease on recent non invasive studies - Venous reflux study on 09/13/2023 showed  minimal deep and one are of superficial venous reflux in the GSV on the right leg but otherwise competent veins. Veins also very small so not a candidate for any venous intervention -  Keep follow up with wound care center on 09/07/23 - encourage her to continue daily elevation above level of her heart, exercise regimen,and  compression stocking use -She can follow up as needed if she has any new or concerning symptoms   Deneen Finical, PA-C [facility] [phone]  On call MD: Fulton Job

## 2023-09-06 ENCOUNTER — Encounter (HOSPITAL_BASED_OUTPATIENT_CLINIC_OR_DEPARTMENT_OTHER): Payer: Self-pay

## 2023-09-06 ENCOUNTER — Ambulatory Visit (HOSPITAL_BASED_OUTPATIENT_CLINIC_OR_DEPARTMENT_OTHER)

## 2023-09-06 DIAGNOSIS — M25552 Pain in left hip: Secondary | ICD-10-CM

## 2023-09-06 DIAGNOSIS — M6281 Muscle weakness (generalized): Secondary | ICD-10-CM | POA: Diagnosis not present

## 2023-09-06 DIAGNOSIS — R262 Difficulty in walking, not elsewhere classified: Secondary | ICD-10-CM

## 2023-09-06 DIAGNOSIS — M25652 Stiffness of left hip, not elsewhere classified: Secondary | ICD-10-CM | POA: Diagnosis not present

## 2023-09-06 NOTE — Therapy (Addendum)
 OUTPATIENT PHYSICAL THERAPY TREATMENT    Progress Note Reporting Period 07/12/2023 to 08/23/2023  See note below for Objective Data and Assessment of Progress/Goals.          Patient Name: Ellen [name] MRN: [id number] DOB:07-Nov-1940, 83 y.o., female Today's Date: 09/06/2023    END OF SESSION:  PT End of Session - 09/06/23 1339     Visit Number 23    Number of Visits 33   Date for PT Re-Evaluation 10/18/2023   Authorization Type MCR A & B    Progress Note Due on Visit 24    PT Start Time 1303    PT Stop Time 1348    PT Time Calculation (min) 45 min    Activity Tolerance Patient tolerated treatment well    Behavior During Therapy WFL for tasks assessed/performed                        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   Left Buccal Cheek (Keratoacanthoma)   Umbilical hernia    Past Surgical History:  Procedure Laterality Date   BOWEL RESECTION  07/09/2013   Procedure: SMALL BOWEL RESECTION;  Surgeon: Keitha Pata, MD;  Location: [facility];  Service: General;;   [name] REPAIR Left 03/22/2023   Procedure: LEFT GLUTEUS MEDIUS REPAIR WITH POSSIBLE COLLAGEN PATCH AUGMENTATION;  Surgeon: Wilhelmenia Harada, MD;  Location: [facility];  Service: Orthopedics;  Laterality: Left;   LAPAROTOMY N/A 07/09/2013   Procedure: EXPLORATORY LAPAROTOMY;  Surgeon: Keitha Pata, MD;  Location: [facility];  Service: General;  Laterality: N/A;   SHOULDER ARTHROSCOPY WITH CAPSULORRHAPHY     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   Hypokalemia 12/27/2022   Volume depletion 12/24/2022   Constipation  12/24/2022   Partial small bowel obstruction (HCC) 12/24/2022   Polycythemia 12/24/2022   Gastroesophageal reflux disease 08/26/2022   Bloating 08/26/2022   Small intestinal bacterial overgrowth (SIBO) 08/26/2022   Osteoarthritis 12/01/2021   Plantar wart 12/01/2021   Ventral hernia 01/01/2021   Diverticulosis 01/05/2019   Dermatitis 01/05/2019   Systolic murmur 07/25/2018   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   Hyperglycemia 10/05/2017   Leg edema 10/05/2017   Hammer toe 10/05/2017   Hot flashes due to menopause 10/05/2017   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/10/2013 07/11/2013   Hypertension 07/09/2013    REFERRING PROVIDER: Wilhelmenia Harada, MD  REFERRING DIAG: [phone] (ICD-10-CM) - Tendinopathy of gluteus medius  S/p glute med repair  THERAPY DIAG:  Difficulty in walking, not elsewhere classified  Stiffness of left hip, not elsewhere classified  Muscle weakness (generalized)  Pain in left hip  Rationale for Evaluation and Treatment: Rehabilitation  ONSET DATE: DOS 03/22/2023  Days since surgery: 168   SUBJECTIVE:   SUBJECTIVE STATEMENT:  Pt reports missing PT the last couple weeks due to being unable to get an appointment. Saw vascular MD who put her in a soft boot. "They didn't cover the wounds first and it hurt so I took it off." Saw MD again who  wanted to replace the boot. Pt refused this. Sees wound care MD tomorrow.   Pt is 21 weeks and 3 days s/p Left hip gluteus medius repair and trochanteric bursectomy.  Pt is still hurting.  Pt received 2 days of relief from the injection on 3/21.  Pt has messaged Dr. Hermina Loosen.  He is trying to get a longer acting steroid to reduce inflammation.  Pt denies any adverse effects after prior Rx.  Pt is having diagnostic testing for circulation in her legs tomorrow.  She sees vascular MD on Monday.  Pt states the ulcer at her heel has not healed.    Pt has difficulty walking due to L hip pain.    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    PAIN:  NPRS:  4/10 current, 8/10 worst.  Worst pain is 1st thing in AM.  Location: L lateral hip  PRECAUTIONS: Other: per surgical protocol, Ulcer on L heel/post ankle/achilles, lumbar anterolisthesis, double hernia    WEIGHT BEARING RESTRICTIONS: Yes WBAT  FALLS:  Has patient fallen in last 6 months? No  LIVING ENVIRONMENT: Lives with: lives with their spouse Lives in: home; lives on 1 floor though does have to go to basement. 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  Pt was ambulating without AD.    PATIENT GOALS: to walk straight and walk normally, improve strength  NEXT MD VISIT: 05/07/2023  OBJECTIVE:  Note: Objective measures were completed at Evaluation unless otherwise noted.   DIAGNOSTIC FINDINGS: Pt is post op.     PATIENT SURVEYS:  4/21: FOTO Initial / Prior / Current:  37 / 49 /47/47 with a goal of 73 at visit 15.   Hip AROM: 4/21 Flex:  110 deg Abd:  24 deg ER:  32 deg IR:  23 deg   Strength: Pt able to perform S/L hip abduction independently Hip flexion:  R:  4+/5, L:  4/5   TODAY'S TREATMENT:       09/06/23      Nustep L5 x 5 mins bilat UE/LE PROM L hip Manual modified thomas hip flexor stretch 2x30 sec Sidelying hip abduction 2x10 LAQ 3# 2x15 5" hold Standing hip abd 2x10ea Sidestepping x 2 laps with UE support on rail Step downs x10  2 inch step  See above for  objective testing    08/19/23 Nustep L4-5 x 5 mins bilat UE/LE Pt received L hip PROM in flexion, abd, ER, and IR per pt tolerance and tissue tolerance Manual modified thomas hip flexor stretch 2x30 sec LAQ 3# 2x10 Standing hip abd 2x10 L, 1x10 R LE Sidestepping x 2 laps with UE support on rail Step downs x10  2 inch step  08/12/23 Nustep L5 UE/LE's x Manual: STM to hip flexors/ rectus femoris, quads, adductors PROM L hip Manual stretching of L hip  Bridges 2x10 LAQ 3# 5" hold 2x10 Standing hip abduction 2x10 Standing hip extension 2x10 Standing march on airex x10 (started to have pain)  08/05/23 Nustep L5 UE/LE's x Manual: STM to hip flexors/ rectus femoris, quads, adductors PROM L hip Manual stretching of L hip    08/03/23 Nustep L5 UE/LE's x Manual: STM to hip flexors/ rectus femoris, quads Tall kneeling hip hinge 2 x 10 Mini squat lateral side out 2 x 10 Mini lunge 1 x 10    PATIENT EDUCATION:  Education details:  dx, relevant anatomy, HEP, POC, exercise form, rationale of interventions, ice usage, and post op and protocol restrictions/limitations. Person educated: Patient Education method: Explanation, Demonstration, Tactile cues, Verbal cues, and Handouts Education comprehension: verbalized understanding, returned demonstration, verbal cues required, tactile cues required, and needs further education  HOME EXERCISE PROGRAM: Access Code: [email or web address] URL: [email or web address] Date: 04/14/2023 Prepared by: Marnie Siren  Updated HEP: - Side Stepping with Counter Support  - 1 x daily - 5-6 x weekly - 2 sets - 10 reps - Standing Hip Hiking  - 1 x daily - 5 x weekly - 2 sets - 10 reps - Standing Hip Abduction with Counter Support  - 1 x daily - 5 x weekly - 2 sets - 10 reps - Standing March with Counter Support  - 1 x daily - 6-7 x weekly - 2 sets - 10 reps  ASSESSMENT:  CLINICAL IMPRESSION: Pt has attended 23 visits of PT thus  far. Has met 4/6 STG and 1/4 LTG at this time. She has made improvements in objective measures, though subjectively denies significant improvement. She continues to c/o pain in lateral hip with walking or stair climbing. Pt plans to have injection at next MD visit. Pt discouraged by ongoing pain level. Able to tolerate PT strengthening interventions with good tolerance overall. She does continue to ambulate with antalgic pattern which ulcers are also contributing to this. Pt will benefit from continued PT to work on  improving functional strength and decreasing pain level.    OBJECTIVE IMPAIRMENTS: Abnormal gait, decreased activity tolerance, decreased endurance, decreased mobility, difficulty walking, decreased ROM, decreased strength, hypomobility, and pain.   ACTIVITY LIMITATIONS: carrying, lifting, standing, squatting, stairs, transfers, and locomotion level  PARTICIPATION LIMITATIONS: cleaning and yard work  PERSONAL FACTORS: 3+ comorbidities: L LE ulcer, double hernia, arthritis  are also affecting patient's functional outcome.   REHAB POTENTIAL: Good  CLINICAL DECISION MAKING: Stable/uncomplicated  EVALUATION COMPLEXITY: Low   GOALS:   SHORT TERM GOALS:  Pt will be independent and compliant with HEP for improved pain, strength, and function. Baseline: Goal status: MET Target date:  05/05/2023   2.  Pt will demo improved quality of gait with increased stance time and toe off on L.   Baseline:  Goal status:  GOAL MET  07/12/23 Target date:  05/12/2023  3.  Pt will progress with exercises per protocol without adverse effects for improved strength and mobility.   Baseline:  Goal status: GOAL MET  07/12/23 Target date:  05/26/2023   4.  Pt will report improved tolerance with standing activities and ambulation.  Baseline:  Goal status:  GOAL MET  07/12/23 Target date:  06/16/2023   5.  Pt will ambulate with a normalized heel to toe gait without limping.  Baseline:  Goal  status:  PROGRESSING  09/06/23 Target date:  06/23/2023   6.  Pt will be able to perform a 6 inch step up with good form and control with L LE leading for improved performance of stairs abd and improved functional strength. Baseline:  Goal status: NOT MET  (limited by ulcers on  feet) Target date:  06/30/2023   LONG TERM GOALS: Target date: 10/18/2023   Pt will be able to ambulate extended community distance without significant difficulty and pain.   Baseline:  Goal status: ONGOING   2.  Pt will be able to perform her ADLs and IADLs including household chores without significant difficulty and pain. Baseline:  Goal status: ONGOING  3.  Pt will be able to take care of her husband without significant limitations.   Baseline:  Goal status:  GOAL MET  07/12/23  4.  Pt will be able to perform mini squats with good form and without increased pain in order for improved functional LE strength and to assist with returning to gardening activities.   Baseline:  Goal status: PROGRESSING      PLAN:  PT FREQUENCY:  2x/wk  PT DURATION: 6 weeks  PLANNED INTERVENTIONS: 97164- PT Re-evaluation, 97110-Therapeutic exercises, 97530- Therapeutic activity, [id number]- Neuromuscular re-education, 97535- Self Care, [id number]- Manual therapy, [id number]- Gait training, [phone]- Aquatic Therapy, 97014- Electrical stimulation (unattended), [phone]- Ultrasound, Patient/Family education, Balance training, Stair training, Taping, Dry Needling, Joint mobilization, Scar mobilization, Cryotherapy, and Moist heat  PLAN FOR NEXT SESSION:  Cont per Dr. Verline Glow gluteus medius repair.  Be aware of healing ulcer in distal L LE at post heel/ankle.    Herb Loges, PTA  09/06/23 5:14 PM  PT reviewed note and extended POC. Trina Fujita III PT, DPT 09/11/23 5:45 PM

## 2023-09-07 ENCOUNTER — Encounter (HOSPITAL_BASED_OUTPATIENT_CLINIC_OR_DEPARTMENT_OTHER): Attending: General Surgery | Admitting: General Surgery

## 2023-09-07 DIAGNOSIS — L97312 Non-pressure chronic ulcer of right ankle with fat layer exposed: Secondary | ICD-10-CM | POA: Insufficient documentation

## 2023-09-07 DIAGNOSIS — L97322 Non-pressure chronic ulcer of left ankle with fat layer exposed: Secondary | ICD-10-CM | POA: Insufficient documentation

## 2023-09-07 DIAGNOSIS — I872 Venous insufficiency (chronic) (peripheral): Secondary | ICD-10-CM | POA: Insufficient documentation

## 2023-09-07 DIAGNOSIS — S81811A Laceration without foreign body, right lower leg, initial encounter: Secondary | ICD-10-CM | POA: Diagnosis not present

## 2023-09-09 ENCOUNTER — Encounter: Payer: Self-pay | Admitting: Radiology

## 2023-09-10 ENCOUNTER — Encounter (HOSPITAL_BASED_OUTPATIENT_CLINIC_OR_DEPARTMENT_OTHER)

## 2023-09-11 NOTE — Addendum Note (Signed)
 Addended by: Grier Leber on: 09/11/2023 05:47 PM   Modules accepted: Orders

## 2023-09-13 ENCOUNTER — Encounter (HOSPITAL_BASED_OUTPATIENT_CLINIC_OR_DEPARTMENT_OTHER)

## 2023-09-15 ENCOUNTER — Encounter (HOSPITAL_BASED_OUTPATIENT_CLINIC_OR_DEPARTMENT_OTHER): Admitting: General Surgery

## 2023-09-15 DIAGNOSIS — L97312 Non-pressure chronic ulcer of right ankle with fat layer exposed: Secondary | ICD-10-CM | POA: Diagnosis not present

## 2023-09-15 DIAGNOSIS — L97322 Non-pressure chronic ulcer of left ankle with fat layer exposed: Secondary | ICD-10-CM | POA: Diagnosis not present

## 2023-09-15 DIAGNOSIS — I872 Venous insufficiency (chronic) (peripheral): Secondary | ICD-10-CM | POA: Diagnosis not present

## 2023-09-16 ENCOUNTER — Ambulatory Visit (HOSPITAL_BASED_OUTPATIENT_CLINIC_OR_DEPARTMENT_OTHER): Attending: Orthopaedic Surgery | Admitting: Physical Therapy

## 2023-09-16 DIAGNOSIS — R262 Difficulty in walking, not elsewhere classified: Secondary | ICD-10-CM | POA: Insufficient documentation

## 2023-09-16 DIAGNOSIS — M25652 Stiffness of left hip, not elsewhere classified: Secondary | ICD-10-CM | POA: Diagnosis not present

## 2023-09-16 DIAGNOSIS — M6281 Muscle weakness (generalized): Secondary | ICD-10-CM | POA: Diagnosis not present

## 2023-09-16 DIAGNOSIS — M25552 Pain in left hip: Secondary | ICD-10-CM | POA: Insufficient documentation

## 2023-09-16 NOTE — Therapy (Signed)
 OUTPATIENT PHYSICAL THERAPY TREATMENT           Patient Name: Ellen [name] MRN: [id number] DOB:[birth year], 83 y.o., female Today's Date: 09/17/2023    END OF SESSION:  PT End of Session - 09/06/23 1339     Visit Number 24    Number of Visits 33   Date for PT Re-Evaluation 10/18/2023   Authorization Type MCR A & B    Progress Note Due on Visit 24    PT Start Time 1456    PT Stop Time 1535    PT Time Calculation (min) 39 min    Activity Tolerance Patient tolerated treatment well    Behavior During Therapy WFL for tasks assessed/performed                        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   Left Buccal Cheek (Keratoacanthoma)   Umbilical hernia    Past Surgical History:  Procedure Laterality Date   BOWEL RESECTION  07/09/2013   Procedure: SMALL BOWEL RESECTION;  Surgeon: Keitha Pata, MD;  Location: [facility];  Service: General;;   [name] REPAIR Left 03/22/2023   Procedure: LEFT GLUTEUS MEDIUS REPAIR WITH POSSIBLE COLLAGEN PATCH AUGMENTATION;  Surgeon: Wilhelmenia Harada, MD;  Location:  [facility];  Service: Orthopedics;  Laterality: Left;   LAPAROTOMY N/A 07/09/2013   Procedure: EXPLORATORY LAPAROTOMY;  Surgeon: Keitha Pata, MD;  Location: [facility];  Service: General;  Laterality: N/A;   SHOULDER ARTHROSCOPY WITH CAPSULORRHAPHY     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   Hypokalemia 12/27/2022   Volume depletion 12/24/2022   Constipation 12/24/2022   Partial small bowel obstruction (HCC) 12/24/2022   Polycythemia 12/24/2022   Gastroesophageal reflux disease 08/26/2022    Bloating 08/26/2022   Small intestinal bacterial overgrowth (SIBO) 08/26/2022   Osteoarthritis 12/01/2021   Plantar wart 12/01/2021   Ventral hernia 01/01/2021   Diverticulosis 01/05/2019   Dermatitis 01/05/2019   Systolic murmur 07/25/2018   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   Hyperglycemia 10/05/2017   Leg edema 10/05/2017   Hammer toe 10/05/2017   Hot flashes due to menopause 10/05/2017   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/10/2013 07/11/2013   Hypertension 07/09/2013    REFERRING PROVIDER: Wilhelmenia Harada, MD  REFERRING DIAG: [phone] (ICD-10-CM) - Tendinopathy of gluteus medius  S/p glute med repair  THERAPY DIAG:  Pain in left hip  Muscle weakness (generalized)  Stiffness of left hip, not elsewhere classified  Difficulty in walking, not elsewhere classified  Rationale for Evaluation and Treatment: Rehabilitation  ONSET DATE: DOS 03/22/2023  Days since surgery: 178   SUBJECTIVE:   SUBJECTIVE STATEMENT:  Pt denies any adverse effects after prior Rx.  Pt is going to wound care center and they are treating bilat heels.  She had to cancel the prior 2 PT appt's due to treating her wounds.  Pt states they say it's looking better.  She reports decreased swelling.  Pt sees MD on 5/21 and is planning on having an injection.  Pt reports her hip pain is staying the same.  Her pain is worse in the AM, and feels better as the day progresses.  Pt states she is doing most of her HEP.  Pt is working and doing some planting today after treatment.        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    PAIN:  NPRS:  1/10 current, 5/10 worst.  Worst pain is 1st thing in AM.   Location: L lateral hip  PRECAUTIONS: Other: per surgical protocol, Ulcer on L heel/post ankle/achilles, lumbar anterolisthesis, double hernia    WEIGHT BEARING RESTRICTIONS: Yes WBAT  FALLS:  Has patient fallen in last 6 months? No  LIVING ENVIRONMENT: Lives with: lives with their spouse Lives in: home; lives on 1 floor though does have to go to basement. 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  Pt was ambulating without AD.    PATIENT GOALS: to walk straight and walk normally, improve strength  NEXT MD VISIT: 05/07/2023  OBJECTIVE:  Note: Objective measures were completed at Evaluation unless otherwise noted.   DIAGNOSTIC FINDINGS: Pt is post op.     PATIENT SURVEYS:  4/21: FOTO Initial / Prior / Current:  53 / 49 /47/47 with a goal of 73 at visit 15.   Hip AROM: 4/21 Flex:  110 deg Abd:  24 deg ER:  32 deg IR:  23 deg   Strength: Pt able to perform S/L hip abduction independently Hip flexion:  R:  4+/5, L:  4/5   TODAY'S TREATMENT:     5/1 Reviewed pt presentation, pain levels, HEP compliance, and response to prior Rx.  Standing hip abduction x 10 bilat S/L hip abduction 2x10 LAQ 3# 2x15 Hip hiking x 10 bilat Step downs on 2 inch step with rail 2x10 Sidestepping with UE support on rail x 1 lap Manual modified thomas hip flexor stretch 2x30 sec  Pt received L hip PROM in flexion, ER, and IR in supine per pt and tolerance  09/06/23      Nustep L5 x 5 mins bilat UE/LE PROM L hip Manual modified thomas hip flexor stretch 2x30 sec Sidelying hip abduction 2x10 LAQ 3# 2x15 5" hold Standing hip abd 2x10ea Sidestepping x 2 laps with UE support on rail Step downs x10  2 inch step  See above for  objective testing    08/19/23 Nustep L4-5 x 5 mins bilat UE/LE Pt received L hip PROM in flexion, abd, ER, and IR per pt tolerance and tissue tolerance Manual modified thomas hip flexor stretch 2x30 sec LAQ 3# 2x10 Standing hip abd 2x10 L, 1x10 R LE Sidestepping x 2 laps with UE support on rail Step downs x10  2 inch step  08/12/23 Nustep L5 UE/LE's x Manual: STM to hip flexors/ rectus femoris, quads, adductors PROM L hip Manual stretching of L hip  Bridges 2x10 LAQ 3# 5" hold 2x10 Standing hip abduction 2x10 Standing hip extension 2x10 Standing march on airex x10 (started  to have pain)     PATIENT EDUCATION:  Education details:  dx, relevant anatomy, HEP, POC, exercise form, rationale of interventions, ice usage, and post op and protocol restrictions/limitations. Person educated: Patient Education method: Explanation, Demonstration, Tactile cues, Verbal cues, and Handouts Education comprehension: verbalized understanding, returned demonstration, verbal cues required, tactile cues required, and needs further education  HOME EXERCISE PROGRAM: Access Code: [email or web address] URL: [email or web address] Date: 04/14/2023 Prepared by: Marnie Siren  Updated HEP: - Side Stepping with Counter Support  - 1 x daily - 5-6 x weekly - 2 sets - 10 reps - Standing Hip Hiking  - 1 x daily - 5 x weekly - 2 sets - 10 reps - Standing Hip Abduction with Counter Support  - 1 x daily - 5 x weekly - 2 sets - 10 reps - Standing March with Counter Support  - 1 x daily - 6-7 x weekly - 2 sets - 10 reps  ASSESSMENT:  CLINICAL IMPRESSION: Pt has wraps on bilat LE's and is receiving wound care on bilat LE's.  She states she has been informed they are improving.  She reports her hip pain is staying the same.  Her worst pain has improved from 8/10 to 5/10.  Pt has been performing some planting in the garden.  She does continue to ambulate with antalgic gait pattern.  Pt tolerates hip PROM  well.  Pt performed exercises well and is able to perform S/L hip abduction independently.  Pt tolerated treatment well and reports increased pain from 1/10 before Rx to 2-3/10 after Rx.    OBJECTIVE IMPAIRMENTS: Abnormal gait, decreased activity tolerance, decreased endurance, decreased mobility, difficulty walking, decreased ROM, decreased strength, hypomobility, and pain.   ACTIVITY LIMITATIONS: carrying, lifting, standing, squatting, stairs, transfers, and locomotion level  PARTICIPATION LIMITATIONS: cleaning and yard work  PERSONAL FACTORS: 3+ comorbidities: L LE ulcer, double hernia, arthritis  are also affecting patient's functional outcome.   REHAB POTENTIAL: Good  CLINICAL DECISION MAKING: Stable/uncomplicated  EVALUATION COMPLEXITY: Low   GOALS:   SHORT TERM GOALS:  Pt will be independent and compliant with HEP for improved pain, strength, and function. Baseline: Goal status: MET Target date:  05/05/2023   2.  Pt will demo improved quality of gait with increased stance time and toe off on L.   Baseline:  Goal status:  GOAL MET  07/12/23 Target date:  05/12/2023  3.  Pt will progress with exercises per protocol without adverse effects for improved strength and mobility.   Baseline:  Goal status: GOAL MET  07/12/23 Target date:  05/26/2023   4.  Pt will report improved tolerance with standing activities and ambulation.  Baseline:  Goal status:  GOAL MET  07/12/23 Target date:  06/16/2023   5.  Pt will ambulate with a normalized heel to toe gait without limping.  Baseline:  Goal status:  PROGRESSING  09/06/23 Target date:  06/23/2023   6.  Pt will be able to perform a 6 inch step up with good form and control with L LE leading for improved performance of stairs abd and improved functional strength. Baseline:  Goal status: NOT MET  (limited by ulcers on feet) Target date:  06/30/2023   LONG TERM GOALS: Target date: 10/18/2023   Pt will be able to ambulate  extended community distance without significant difficulty and pain.   Baseline:  Goal status: ONGOING   2.  Pt will be able to perform her ADLs and IADLs including household chores without significant difficulty  and pain. Baseline:  Goal status: ONGOING  3.  Pt will be able to take care of her husband without significant limitations.   Baseline:  Goal status:  GOAL MET  07/12/23  4.  Pt will be able to perform mini squats with good form and without increased pain in order for improved functional LE strength and to assist with returning to gardening activities.   Baseline:  Goal status: PROGRESSING      PLAN:  PT FREQUENCY:  2x/wk  PT DURATION: 6 weeks  PLANNED INTERVENTIONS: 97164- PT Re-evaluation, 97110-Therapeutic exercises, 97530- Therapeutic activity, [id number]- Neuromuscular re-education, 97535- Self Care, 16109- Manual therapy, [id number]- Gait training, [phone]- Aquatic Therapy, 97014- Electrical stimulation (unattended), [phone]- Ultrasound, Patient/Family education, Balance training, Stair training, Taping, Dry Needling, Joint mobilization, Scar mobilization, Cryotherapy, and Moist heat  PLAN FOR NEXT SESSION:  Cont per Dr. Verline Glow gluteus medius repair.  Be aware of healing ulcer in distal L LE at post heel/ankle.    Trina Fujita III PT, DPT 09/17/23 12:27 PM

## 2023-09-17 ENCOUNTER — Encounter (HOSPITAL_BASED_OUTPATIENT_CLINIC_OR_DEPARTMENT_OTHER): Payer: Self-pay | Admitting: Physical Therapy

## 2023-09-21 ENCOUNTER — Ambulatory Visit (HOSPITAL_BASED_OUTPATIENT_CLINIC_OR_DEPARTMENT_OTHER): Admitting: Physical Therapy

## 2023-09-21 DIAGNOSIS — M6281 Muscle weakness (generalized): Secondary | ICD-10-CM | POA: Diagnosis not present

## 2023-09-21 DIAGNOSIS — R262 Difficulty in walking, not elsewhere classified: Secondary | ICD-10-CM | POA: Diagnosis not present

## 2023-09-21 DIAGNOSIS — M25652 Stiffness of left hip, not elsewhere classified: Secondary | ICD-10-CM

## 2023-09-21 DIAGNOSIS — M25552 Pain in left hip: Secondary | ICD-10-CM | POA: Diagnosis not present

## 2023-09-21 NOTE — Therapy (Signed)
 OUTPATIENT PHYSICAL THERAPY TREATMENT           Patient Name: Ellen [name] MRN: [id number] DOB:Sep 06, 1940, 83 y.o., female Today's Date: 09/22/2023    END OF SESSION:  PT End of Session - 09/21/23     Visit Number 25   Number of Visits 33   Date for PT Re-Evaluation 10/18/2023   Authorization Type MCR A & B    Progress Note Due on Visit     PT Start Time 1416    PT Stop Time 1451    PT Time Calculation (min) 35 min    Activity Tolerance Patient tolerated treatment well    Behavior During Therapy WFL for tasks assessed/performed                        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   Left Buccal Cheek (Keratoacanthoma)   Umbilical hernia    Past Surgical History:  Procedure Laterality Date   BOWEL RESECTION  07/09/2013   Procedure: SMALL BOWEL RESECTION;  Surgeon: Keitha Pata, MD;  Location: [facility];  Service: General;;   [name] REPAIR Left 03/22/2023   Procedure: LEFT GLUTEUS MEDIUS REPAIR WITH POSSIBLE COLLAGEN PATCH AUGMENTATION;  Surgeon: Wilhelmenia Harada, MD;  Location: [facility];  Service: Orthopedics;  Laterality: Left;   LAPAROTOMY N/A 07/09/2013   Procedure: EXPLORATORY LAPAROTOMY;  Surgeon: Keitha Pata, MD;  Location: [facility];  Service: General;  Laterality: N/A;   SHOULDER ARTHROSCOPY WITH CAPSULORRHAPHY     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   Hypokalemia 12/27/2022   Volume depletion 12/24/2022   Constipation 12/24/2022   Partial small bowel obstruction (HCC) 12/24/2022   Polycythemia 12/24/2022   Gastroesophageal reflux disease 08/26/2022   Bloating  08/26/2022   Small intestinal bacterial overgrowth (SIBO) 08/26/2022   Osteoarthritis 12/01/2021   Plantar wart 12/01/2021   Ventral hernia 01/01/2021   Diverticulosis 01/05/2019   Dermatitis 01/05/2019   Systolic murmur 07/25/2018   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   Hyperglycemia 10/05/2017   Leg edema 10/05/2017   Hammer toe 10/05/2017   Hot flashes due to menopause 10/05/2017   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/10/2013 07/11/2013   Hypertension 07/09/2013    REFERRING PROVIDER: Wilhelmenia Harada, MD  REFERRING DIAG: [phone] (ICD-10-CM) - Tendinopathy of gluteus medius  S/p glute med repair  THERAPY DIAG:  Pain in left hip  Muscle weakness (generalized)  Stiffness of left hip, not elsewhere classified  Difficulty in walking, not elsewhere classified  Rationale for Evaluation and Treatment: Rehabilitation  ONSET DATE: DOS 03/22/2023  Days since surgery: 183   SUBJECTIVE:   SUBJECTIVE STATEMENT:  Pt denies any adverse effects after prior Rx.  Pt states she feels about the same.  Pt is going to wound care center and they are treating bilat heels.  She has a wound care appt tomorrow.  Pt sees MD on 5/21 and is planning on having an injection.  Pt has been cutting grass today.  Her pain is worse in the AM,  and feels better as the day progresses.  Pt states she is doing most of her HEP.        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    PAIN:  NPRS:  2/10 current, 5/10 worst.  Worst pain is 1st thing in AM.  Location: L lateral hip  PRECAUTIONS: Other: per surgical protocol, Ulcer on L heel/post ankle/achilles, lumbar anterolisthesis, double hernia    WEIGHT  BEARING RESTRICTIONS: Yes WBAT  FALLS:  Has patient fallen in last 6 months? No  LIVING ENVIRONMENT: Lives with: lives with their spouse Lives in: home; lives on 1 floor though does have to go to basement. 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  Pt was ambulating without AD.    PATIENT GOALS: to walk straight and walk normally, improve strength  NEXT MD VISIT: 05/07/2023  OBJECTIVE:  Note: Objective measures were completed at Evaluation unless otherwise noted.   DIAGNOSTIC FINDINGS: Pt is post op.     PATIENT SURVEYS:  4/21: FOTO Initial / Prior / Current:  70 / 49 /47/47 with a goal of 73 at visit 15.   Hip AROM: 4/21 Flex:  110 deg Abd:  24 deg ER:  32 deg IR:  23 deg   Strength: Pt able to perform S/L hip abduction independently Hip flexion:  R:  4+/5, L:  4/5   TODAY'S TREATMENT:    5/5  Pt received L hip PROM in flexion, abd, ER, and IR in supine per pt and tolerance  S/L hip abduction 2x10 LAQ 4# 3x10 Hip hiking x 10 bilat Step downs on 2 inch step with rail x10 Sidestepping with UE support on rail x 2 laps Supine bridge 2x10 Manual modified thomas hip flexor stretch 3x30 sec  5/1 Reviewed pt presentation, pain levels, HEP compliance, and response to prior Rx.  Standing hip abduction x 10 bilat S/L hip abduction 2x10 LAQ 3# 2x15 Hip hiking x 10 bilat Step downs on 2 inch step with rail 2x10 Sidestepping with UE support on rail x 1 lap Manual modified thomas hip flexor stretch 2x30 sec  Pt received L hip PROM in flexion, ER, and IR in supine per pt and tolerance  09/06/23      Nustep L5 x 5 mins bilat UE/LE PROM L hip Manual modified thomas hip flexor stretch 2x30 sec Sidelying hip  abduction 2x10 LAQ 3# 2x15 5" hold Standing hip abd 2x10ea Sidestepping x 2 laps with UE support on rail Step downs x10  2 inch step  See above for objective testing    08/19/23 Nustep L4-5 x 5 mins bilat UE/LE Pt received L hip PROM in flexion, abd, ER, and IR per pt tolerance and tissue tolerance Manual modified thomas hip flexor stretch 2x30 sec LAQ 3# 2x10 Standing hip abd 2x10 L, 1x10 R LE Sidestepping x 2 laps with UE support on rail Step downs x10  2 inch step  08/12/23 Nustep L5 UE/LE's x Manual: STM to hip flexors/ rectus  femoris, quads, adductors PROM L hip Manual stretching of L hip  Bridges 2x10 LAQ 3# 5" hold 2x10 Standing hip abduction 2x10 Standing hip extension 2x10 Standing march on airex x10 (started to have pain)     PATIENT EDUCATION:  Education details:  dx, relevant anatomy, HEP, POC, exercise form, rationale of interventions, ice usage, and post op and protocol restrictions/limitations. Person educated: Patient Education method: Explanation, Demonstration, Tactile cues, Verbal cues, and Handouts Education comprehension: verbalized understanding, returned demonstration, verbal cues required, tactile cues required, and needs further education  HOME EXERCISE PROGRAM: Access Code: [email or web address] URL: [email or web address] Date: 04/14/2023 Prepared by: Marnie Siren  Updated HEP: - Side Stepping with Counter Support  - 1 x daily - 5-6 x weekly - 2 sets - 10 reps - Standing Hip Hiking  - 1 x daily - 5 x weekly - 2 sets - 10 reps - Standing Hip Abduction with Counter Support  - 1 x daily - 5 x weekly - 2 sets - 10 reps - Standing March with Counter Support  - 1 x daily - 6-7 x weekly - 2 sets - 10 reps  ASSESSMENT:  CLINICAL IMPRESSION: She reports her hip pain is the same.  Pt continues to be active with gardening and yard work.  Pt continues to have a limp with gait.  Pt tolerates hip PROM well.  PT is working on improving tolerance  with closed chain exercises and improving hip strength without irritating L hip.  Pt tolerated treatment well and reports increased pain from 2/10 before Rx to 3/10 after Rx.  Pt is planning to have an injection on 5/21.    OBJECTIVE IMPAIRMENTS: Abnormal gait, decreased activity tolerance, decreased endurance, decreased mobility, difficulty walking, decreased ROM, decreased strength, hypomobility, and pain.   ACTIVITY LIMITATIONS: carrying, lifting, standing, squatting, stairs, transfers, and locomotion level  PARTICIPATION LIMITATIONS: cleaning and yard work  PERSONAL FACTORS: 3+ comorbidities: L LE ulcer, double hernia, arthritis  are also affecting patient's functional outcome.   REHAB POTENTIAL: Good  CLINICAL DECISION MAKING: Stable/uncomplicated  EVALUATION COMPLEXITY: Low   GOALS:   SHORT TERM GOALS:  Pt will be independent and compliant with HEP for improved pain, strength, and function. Baseline: Goal status: MET Target date:  05/05/2023   2.  Pt will demo improved quality of gait with increased stance time and toe off on L.   Baseline:  Goal status:  GOAL MET  07/12/23 Target date:  05/12/2023  3.  Pt will progress with exercises per protocol without adverse effects for improved strength and mobility.   Baseline:  Goal status: GOAL MET  07/12/23 Target date:  05/26/2023   4.  Pt will report improved tolerance with standing activities and ambulation.  Baseline:  Goal status:  GOAL MET  07/12/23 Target date:  06/16/2023   5.  Pt will ambulate with a normalized heel to toe gait without limping.  Baseline:  Goal status:  PROGRESSING  09/06/23 Target date:  06/23/2023   6.  Pt will be able to perform a 6 inch step up with good form and control with L LE leading for improved performance of stairs abd and improved functional strength. Baseline:  Goal status: NOT MET  (limited by ulcers on feet) Target date:  06/30/2023   LONG TERM GOALS: Target date:  10/18/2023   Pt will be able to ambulate extended community distance without significant difficulty and pain.   Baseline:  Goal status: ONGOING   2.  Pt will be able  to perform her ADLs and IADLs including household chores without significant difficulty and pain. Baseline:  Goal status: ONGOING  3.  Pt will be able to take care of her husband without significant limitations.   Baseline:  Goal status:  GOAL MET  07/12/23  4.  Pt will be able to perform mini squats with good form and without increased pain in order for improved functional LE strength and to assist with returning to gardening activities.   Baseline:  Goal status: PROGRESSING      PLAN:  PT FREQUENCY:  2x/wk  PT DURATION: 6 weeks  PLANNED INTERVENTIONS: 97164- PT Re-evaluation, 97110-Therapeutic exercises, 97530- Therapeutic activity, [id number]- Neuromuscular re-education, 97535- Self Care, 29562- Manual therapy, [id number]- Gait training, [phone]- Aquatic Therapy, 97014- Electrical stimulation (unattended), [phone]- Ultrasound, Patient/Family education, Balance training, Stair training, Taping, Dry Needling, Joint mobilization, Scar mobilization, Cryotherapy, and Moist heat  PLAN FOR NEXT SESSION:  Cont per Dr. Verline Glow gluteus medius repair.  Be aware of healing ulcer in distal L LE at post heel/ankle.    Trina Fujita III PT, DPT 09/22/23 9:31 PM

## 2023-09-22 ENCOUNTER — Encounter (HOSPITAL_BASED_OUTPATIENT_CLINIC_OR_DEPARTMENT_OTHER): Payer: Self-pay | Admitting: Physical Therapy

## 2023-09-22 ENCOUNTER — Encounter (HOSPITAL_BASED_OUTPATIENT_CLINIC_OR_DEPARTMENT_OTHER): Attending: General Surgery | Admitting: General Surgery

## 2023-09-22 DIAGNOSIS — L97822 Non-pressure chronic ulcer of other part of left lower leg with fat layer exposed: Secondary | ICD-10-CM | POA: Diagnosis not present

## 2023-09-22 DIAGNOSIS — L97322 Non-pressure chronic ulcer of left ankle with fat layer exposed: Secondary | ICD-10-CM | POA: Diagnosis not present

## 2023-09-22 DIAGNOSIS — L97312 Non-pressure chronic ulcer of right ankle with fat layer exposed: Secondary | ICD-10-CM | POA: Insufficient documentation

## 2023-09-22 DIAGNOSIS — I872 Venous insufficiency (chronic) (peripheral): Secondary | ICD-10-CM | POA: Diagnosis not present

## 2023-09-23 ENCOUNTER — Encounter (HOSPITAL_BASED_OUTPATIENT_CLINIC_OR_DEPARTMENT_OTHER): Payer: Self-pay | Admitting: Physical Therapy

## 2023-09-23 ENCOUNTER — Ambulatory Visit (HOSPITAL_BASED_OUTPATIENT_CLINIC_OR_DEPARTMENT_OTHER): Admitting: Physical Therapy

## 2023-09-23 DIAGNOSIS — M6281 Muscle weakness (generalized): Secondary | ICD-10-CM

## 2023-09-23 DIAGNOSIS — R262 Difficulty in walking, not elsewhere classified: Secondary | ICD-10-CM

## 2023-09-23 DIAGNOSIS — M25552 Pain in left hip: Secondary | ICD-10-CM | POA: Diagnosis not present

## 2023-09-23 DIAGNOSIS — M25652 Stiffness of left hip, not elsewhere classified: Secondary | ICD-10-CM

## 2023-09-23 NOTE — Therapy (Signed)
 OUTPATIENT PHYSICAL THERAPY TREATMENT           Patient Name: Ellen [name] MRN: [id number] DOB:1940-07-10, 83 y.o., female Today's Date: 09/24/2023    END OF SESSION:  PT End of Session - 09/23/23     Visit Number 26   Number of Visits 33   Date for PT Re-Evaluation 10/18/2023   Authorization Type MCR A & B    Progress Note Due on Visit     PT Start Time 1410    PT Stop Time 1453    PT Time Calculation (min) 43 min    Activity Tolerance Patient tolerated treatment well    Behavior During Therapy WFL for tasks assessed/performed                        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   Left Buccal Cheek (Keratoacanthoma)   Umbilical hernia    Past Surgical History:  Procedure Laterality Date   BOWEL RESECTION  07/09/2013   Procedure: SMALL BOWEL RESECTION;  Surgeon: Keitha Pata, MD;  Location: [facility];  Service: General;;   [name] REPAIR Left 03/22/2023   Procedure: LEFT GLUTEUS MEDIUS REPAIR WITH POSSIBLE COLLAGEN PATCH AUGMENTATION;  Surgeon: Wilhelmenia Harada, MD;  Location: [facility];  Service: Orthopedics;  Laterality: Left;   LAPAROTOMY N/A 07/09/2013   Procedure: EXPLORATORY LAPAROTOMY;  Surgeon: Keitha Pata, MD;  Location: [facility];  Service: General;  Laterality: N/A;   SHOULDER ARTHROSCOPY WITH CAPSULORRHAPHY     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   Hypokalemia 12/27/2022   Volume depletion 12/24/2022   Constipation 12/24/2022   Partial small bowel obstruction (HCC) 12/24/2022   Polycythemia 12/24/2022   Gastroesophageal reflux disease 08/26/2022   Bloating  08/26/2022   Small intestinal bacterial overgrowth (SIBO) 08/26/2022   Osteoarthritis 12/01/2021   Plantar wart 12/01/2021   Ventral hernia 01/01/2021   Diverticulosis 01/05/2019   Dermatitis 01/05/2019   Systolic murmur 07/25/2018   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   Hyperglycemia 10/05/2017   Leg edema 10/05/2017   Hammer toe 10/05/2017   Hot flashes due to menopause 10/05/2017   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/10/2013 07/11/2013   Hypertension 07/09/2013    REFERRING PROVIDER: Wilhelmenia Harada, MD  REFERRING DIAG: [id number] (ICD-10-CM) - Tendinopathy of gluteus medius  S/p glute med repair  THERAPY DIAG:  Pain in left hip  Muscle weakness (generalized)  Stiffness of left hip, not elsewhere classified  Difficulty in walking, not elsewhere classified  Rationale for Evaluation and Treatment: Rehabilitation  ONSET DATE: DOS 03/22/2023  Days since surgery: 185   SUBJECTIVE:   SUBJECTIVE STATEMENT:  Pt denies any adverse effects after prior Rx.  Pt states the modified thomas stretch bothered her last visit.  Her hip was a little tender after prior Rx.  Pt states her hip hurt all day yesterday and doesn't know why.  She had a 5/10 pain all day long.   Pt had a wound care appt yesterday and had her wounds debrided.  Pt was informed her wounds are improving.  Pt states the R side is good and L side is getting there.       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    PAIN:  NPRS:  Her pain was 5/10 earlier and 3/10 currently, 5/10 worst.  Worst pain is 1st thing in AM.  Location: L lateral hip  PRECAUTIONS: Other: per surgical protocol, Ulcer on L heel/post  ankle/achilles, lumbar anterolisthesis, double hernia    WEIGHT BEARING RESTRICTIONS: Yes WBAT  FALLS:  Has patient fallen in last 6 months? No  LIVING ENVIRONMENT: Lives with: lives with their spouse Lives in: home; lives on 1 floor though does have to go to basement. 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  Pt was ambulating without AD.    PATIENT GOALS: to walk straight and walk normally, improve strength  NEXT MD VISIT: 05/07/2023  OBJECTIVE:  Note: Objective measures were completed at Evaluation unless otherwise noted.   DIAGNOSTIC FINDINGS: Pt is post op.     PATIENT SURVEYS:  4/21: FOTO Initial / Prior / Current:  44 / 49 /47/47 with a goal of 73 at visit 15.   Hip AROM: 4/21 Flex:  110 deg Abd:  24 deg ER:  32 deg IR:  23 deg   Strength: Pt able to perform S/L hip abduction independently Hip flexion:  R:  4+/5, L:  4/5   TODAY'S TREATMENT:    5/8 Nustep L5 UEs/LEs  S/L hip abduction 2x10 LAQ 4# 3x10 Hip hiking 2 x 10 bilat Step downs on 2 inch step with rail x10 Lateral band walks with YTB around thighs with UE support on rail x 2 laps Step ups 2 inch and 4 inch x 10 reps each with UE support  Marching on airex 2x10  Pt received L hip PROM in flexion, ER, and IR in supine per pt and tolerance   5/5  Pt received L hip PROM in flexion, abd, ER, and IR in supine per pt and tolerance  S/L hip abduction 2x10 LAQ 4# 3x10 Hip hiking x 10 bilat Step downs on 2 inch step with rail x10 Sidestepping with UE support on rail x 2 laps Supine bridge 2x10 Manual modified thomas hip flexor stretch 3x30 sec  5/1 Reviewed pt presentation, pain levels, HEP compliance, and response to prior Rx.  Standing  hip abduction x 10 bilat S/L hip abduction 2x10 LAQ 3# 2x15 Hip hiking x 10 bilat Step downs on 2 inch step with rail 2x10 Sidestepping with UE support on rail x 1 lap Manual modified thomas hip flexor stretch 2x30 sec  Pt received L hip PROM in flexion, ER, and IR in supine per pt and tolerance  09/06/23      Nustep L5 x 5 mins bilat UE/LE PROM L hip Manual modified thomas hip flexor stretch 2x30 sec Sidelying hip abduction 2x10 LAQ 3# 2x15 5" hold Standing hip abd 2x10ea Sidestepping x 2 laps with UE support on rail  Step downs x10  2 inch step  See above for objective testing    08/19/23 Nustep L4-5 x 5 mins bilat UE/LE Pt received L hip PROM in flexion, abd, ER, and IR per pt tolerance and tissue tolerance Manual modified thomas hip flexor stretch 2x30 sec LAQ 3# 2x10 Standing hip abd 2x10 L, 1x10 R LE Sidestepping x 2 laps with UE support on rail Step downs x10  2 inch step      PATIENT EDUCATION:  Education details:  dx, relevant anatomy, HEP, POC, exercise form, rationale of interventions, ice usage, and post op and protocol restrictions/limitations. Person educated: Patient Education method: Explanation, Demonstration, Tactile cues, Verbal cues, and Handouts Education comprehension: verbalized understanding, returned demonstration, verbal cues required, tactile cues required, and needs further education  HOME EXERCISE PROGRAM: Access Code: [email or web address] URL: [email or web address] Date: 04/14/2023 Prepared by: Marnie Siren  Updated HEP: - Side Stepping with Counter Support  - 1 x daily - 5-6 x weekly - 2 sets - 10 reps - Standing Hip Hiking  - 1 x daily - 5 x weekly - 2 sets - 10 reps - Standing Hip Abduction with Counter Support  - 1 x daily - 5 x weekly - 2 sets - 10 reps - Standing March with Counter Support  - 1 x daily - 6-7 x weekly - 2 sets - 10 reps  ASSESSMENT:  CLINICAL IMPRESSION: She reports her hip pain is the same.  Pt continues  to be active with gardening and yard work.  She continues to have a limp with gait.  PT is slowly increasing intensity of exercises with increased closed chain activities.  PT is working on improving tolerance with closed chain exercises and improving hip strength without irritating L hip.  Pt able to perform step ups on a 2 inch and 4 inch step today.  She tolerated exercises well and reports improved pain to 2/10 after Rx.  Pt is planning to have an injection on 5/21.      OBJECTIVE IMPAIRMENTS: Abnormal gait, decreased activity tolerance, decreased endurance, decreased mobility, difficulty walking, decreased ROM, decreased strength, hypomobility, and pain.   ACTIVITY LIMITATIONS: carrying, lifting, standing, squatting, stairs, transfers, and locomotion level  PARTICIPATION LIMITATIONS: cleaning and yard work  PERSONAL FACTORS: 3+ comorbidities: L LE ulcer, double hernia, arthritis are also affecting patient's functional outcome.   REHAB POTENTIAL: Good  CLINICAL DECISION MAKING: Stable/uncomplicated  EVALUATION COMPLEXITY: Low   GOALS:   SHORT TERM GOALS:  Pt will be independent and compliant with HEP for improved pain, strength, and function. Baseline: Goal status: MET Target date:  05/05/2023   2.  Pt will demo improved quality of gait with increased stance time and toe off on L.   Baseline:  Goal status:  GOAL MET  07/12/23 Target date:  05/12/2023  3.  Pt will progress with exercises per protocol without adverse effects for improved strength and mobility.   Baseline:  Goal status: GOAL MET  07/12/23 Target date:  05/26/2023   4.  Pt will report improved tolerance with standing activities and ambulation.  Baseline:  Goal status:  GOAL MET  07/12/23 Target date:  06/16/2023   5.  Pt will ambulate with a normalized heel to toe gait without limping.  Baseline:  Goal status:  PROGRESSING  09/06/23 Target date:  06/23/2023   6.  Pt will be able to perform a 6 inch step up  with good form and control with L LE leading for improved performance of  stairs abd and improved functional strength. Baseline:  Goal status: NOT MET  (limited by ulcers on feet) Target date:  06/30/2023   LONG TERM GOALS: Target date: 10/18/2023   Pt will be able to ambulate extended community distance without significant difficulty and pain.   Baseline:  Goal status: ONGOING   2.  Pt will be able to perform her ADLs and IADLs including household chores without significant difficulty and pain. Baseline:  Goal status: ONGOING  3.  Pt will be able to take care of her husband without significant limitations.   Baseline:  Goal status:  GOAL MET  07/12/23  4.  Pt will be able to perform mini squats with good form and without increased pain in order for improved functional LE strength and to assist with returning to gardening activities.   Baseline:  Goal status: PROGRESSING      PLAN:  PT FREQUENCY:  2x/wk  PT DURATION: 6 weeks  PLANNED INTERVENTIONS: 97164- PT Re-evaluation, 97110-Therapeutic exercises, 97530- Therapeutic activity, [id number]- Neuromuscular re-education, 97535- Self Care, 65784- Manual therapy, [id number]- Gait training, [phone]- Aquatic Therapy, 97014- Electrical stimulation (unattended), [phone]- Ultrasound, Patient/Family education, Balance training, Stair training, Taping, Dry Needling, Joint mobilization, Scar mobilization, Cryotherapy, and Moist heat  PLAN FOR NEXT SESSION:  Cont per Dr. Verline Glow gluteus medius repair.  Be aware of healing ulcer in distal L LE at post heel/ankle.    Trina Fujita III PT, DPT 09/24/23 9:27 PM

## 2023-09-24 ENCOUNTER — Other Ambulatory Visit: Payer: Self-pay | Admitting: *Deleted

## 2023-09-24 MED ORDER — PREMPRO 0.45-1.5 MG PO TABS: 1.0000 | ORAL_TABLET | Freq: Every day | ORAL | 0 refills | Status: DC

## 2023-09-28 ENCOUNTER — Ambulatory Visit (HOSPITAL_BASED_OUTPATIENT_CLINIC_OR_DEPARTMENT_OTHER): Admitting: Physical Therapy

## 2023-09-28 DIAGNOSIS — M6281 Muscle weakness (generalized): Secondary | ICD-10-CM

## 2023-09-28 DIAGNOSIS — M25552 Pain in left hip: Secondary | ICD-10-CM | POA: Diagnosis not present

## 2023-09-28 DIAGNOSIS — M25652 Stiffness of left hip, not elsewhere classified: Secondary | ICD-10-CM | POA: Diagnosis not present

## 2023-09-28 DIAGNOSIS — R262 Difficulty in walking, not elsewhere classified: Secondary | ICD-10-CM

## 2023-09-28 NOTE — Therapy (Signed)
 OUTPATIENT PHYSICAL THERAPY TREATMENT           Patient Name: Ellen [name] MRN: [id number] DOB:06/19/1940, 83 y.o., female Today's Date: 09/29/2023    END OF SESSION: PT End of Session -  09/28/2023    Visit Number 27   Number of Visits 33   Date for PT Re-Evaluation 10/18/2023   Authorization Type MCR A&B   PT Start Time  1412   PT Stop Time 1457   PT Time Calculation (min) 45 min   Activity Tolerance Patient tolerated treatment well   Behavior During Therapy  WFL for tasks assessed/performed               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   Left Buccal Cheek (Keratoacanthoma)   Umbilical hernia    Past Surgical History:  Procedure Laterality Date   BOWEL RESECTION  07/09/2013   Procedure: SMALL BOWEL RESECTION;  Surgeon: Keitha Pata, MD;  Location: [facility];  Service: General;;   [name] REPAIR Left 03/22/2023   Procedure: LEFT GLUTEUS MEDIUS REPAIR WITH POSSIBLE COLLAGEN PATCH AUGMENTATION;  Surgeon: Wilhelmenia Harada, MD;  Location: [facility];  Service: Orthopedics;  Laterality: Left;   LAPAROTOMY N/A 07/09/2013   Procedure: EXPLORATORY LAPAROTOMY;  Surgeon: Keitha Pata, MD;  Location: [facility];  Service: General;  Laterality: N/A;   SHOULDER ARTHROSCOPY WITH CAPSULORRHAPHY     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   Hypokalemia 12/27/2022   Volume depletion 12/24/2022   Constipation 12/24/2022   Partial small bowel obstruction (HCC) 12/24/2022   Polycythemia 12/24/2022   Gastroesophageal reflux disease 08/26/2022   Bloating 08/26/2022   Small intestinal bacterial overgrowth  (SIBO) 08/26/2022   Osteoarthritis 12/01/2021   Plantar wart 12/01/2021   Ventral hernia 01/01/2021   Diverticulosis 01/05/2019   Dermatitis 01/05/2019   Systolic murmur 07/25/2018   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   Hyperglycemia 10/05/2017   Leg edema 10/05/2017   Hammer toe 10/05/2017   Hot flashes due to menopause 10/05/2017   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/10/2013 07/11/2013   Hypertension 07/09/2013    REFERRING PROVIDER: Wilhelmenia Harada, MD  REFERRING DIAG: [phone] (ICD-10-CM) - Tendinopathy of gluteus medius  S/p glute med repair  THERAPY DIAG:  Pain in left hip  Muscle weakness (generalized)  Stiffness of left hip, not elsewhere classified  Difficulty in walking, not elsewhere classified  Rationale for Evaluation and Treatment: Rehabilitation  ONSET DATE: DOS 03/22/2023  Days since surgery: 190   SUBJECTIVE:   SUBJECTIVE STATEMENT:  Pt is 27 weeks and 1 day s/p L hip gluteus medius repair.  Pt states she felt fine after prior Rx.  Pt states she had a little increased pain, but not bad.  Pt feels about the same.  Pt reports no change in pain.  Pt uses a walker 1st thing in AM due to pain.  Pt has pain and stiffness 1st thing in AM.  Pt has been working in the garden today.       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    PAIN:  NPRS:  4/10 currently, 5/10 worst.  Worst pain is 1st thing in AM.  Location: L lateral hip  PRECAUTIONS: Other: per surgical protocol, Ulcer on L heel/post ankle/achilles, lumbar anterolisthesis, double hernia    WEIGHT BEARING RESTRICTIONS: Yes WBAT  FALLS:  Has patient fallen in last 6 months? No  LIVING  ENVIRONMENT: Lives with: lives with their spouse Lives in: home; lives on 1 floor though does have to go to basement. 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  Pt was ambulating without AD.    PATIENT GOALS: to walk straight and walk normally, improve strength  NEXT MD VISIT: 05/07/2023  OBJECTIVE:  Note: Objective measures were completed at Evaluation unless otherwise noted.   DIAGNOSTIC FINDINGS: Pt is post op.     PATIENT SURVEYS:  4/21: FOTO Initial / Prior / Current:  71 / 49 /47/47 with a goal of 73 at visit 15.   Hip AROM: 4/21 Flex:  110 deg Abd:  24 deg ER:  32 deg IR:  23 deg   Strength: Pt able to perform S/L hip abduction independently Hip flexion:  R:  4+/5, L:  4/5   TODAY'S TREATMENT:    5/13  Hip abd strength:  R/L:  33.6 / 31.4 Pt tolerates moderate manual resistance with L hip abd in R S/L'ing without pain Pt reports having tenderness in bilat ITB worse on L. Gait:  Pt leans to the R with increased WB'ing thru R LE.  Pt reports 5-6/10 pain with ambulation.   Lateral band walks with hands on wall with RTB x 1 set and with YTB around thighs x 1 set  (10 steps) Step ups 4 inch step 2x10 with UE support on counter Hip hiking 2x10  Pt received L hip PROM in flexion, ER, and IR in supine per pt and tolerance   5/8 Nustep L5 UEs/LEs  S/L hip abduction 2x10 LAQ 4# 3x10 Hip hiking 2 x 10 bilat Step downs on 2 inch step with rail x10 Lateral band walks with YTB around thighs with UE support on rail x 2 laps Step ups 2 inch and 4 inch x 10 reps each with UE support  Marching on airex 2x10  Pt received L hip PROM in flexion, ER, and IR in supine per pt and tolerance   5/5  Pt received L hip PROM  in flexion, abd, ER, and IR in supine per pt and tolerance  S/L hip abduction 2x10 LAQ 4# 3x10 Hip hiking x 10 bilat Step downs on 2 inch step with rail x10 Sidestepping with UE support on rail x 2 laps Supine bridge 2x10 Manual modified thomas hip flexor stretch 3x30 sec  5/1 Reviewed pt presentation, pain levels, HEP compliance, and response to prior Rx.  Standing hip abduction x 10 bilat S/L hip abduction 2x10 LAQ 3# 2x15 Hip hiking x 10 bilat Step downs on 2 inch step with rail 2x10 Sidestepping with UE support on rail x  1 lap Manual modified thomas hip flexor stretch 2x30 sec  Pt received L hip PROM in flexion, ER, and IR in supine per pt and tolerance     PATIENT EDUCATION:  Education details:  dx, relevant anatomy, objective findings, HEP, POC, exercise form, and rationale of interventions. Person educated: Patient Education method: Explanation, Demonstration, Tactile cues, Verbal cues, and Handouts Education comprehension: verbalized understanding, returned demonstration, verbal cues required, tactile cues required, and needs further education  HOME EXERCISE PROGRAM: Access Code: [email or web address] URL: [email or web address] Date: 04/14/2023 Prepared by: Marnie Siren   ASSESSMENT:  CLINICAL IMPRESSION: Pt continues to have pain and states she feels the same.  Pt continues to be active with gardening and yard work.  She continues to have a limp with gait and reports pain with ambulating.  PT assessed glute med strength and pt did well with strength testing.  She tolerates moderate resistance in S/L'ing without pain.  Pt's L glute med was only 2 lbs less than her R glute med with HHD testing in sitting.  She had pain in the hip with lateral band walks with RTB.  PT decreased resistance to YTB and she had decreased hip pain though felt pain in her back.  She reports minimally improved pain to 3-4/10 after Rx.  Pt is planning to have an injection on  5/21.      OBJECTIVE IMPAIRMENTS: Abnormal gait, decreased activity tolerance, decreased endurance, decreased mobility, difficulty walking, decreased ROM, decreased strength, hypomobility, and pain.   ACTIVITY LIMITATIONS: carrying, lifting, standing, squatting, stairs, transfers, and locomotion level  PARTICIPATION LIMITATIONS: cleaning and yard work  PERSONAL FACTORS: 3+ comorbidities: L LE ulcer, double hernia, arthritis are also affecting patient's functional outcome.   REHAB POTENTIAL: Good  CLINICAL DECISION MAKING: Stable/uncomplicated  EVALUATION COMPLEXITY: Low   GOALS:   SHORT TERM GOALS:  Pt will be independent and compliant with HEP for improved pain, strength, and function. Baseline: Goal status: MET Target date:  05/05/2023   2.  Pt will demo improved quality of gait with increased stance time and toe off on L.   Baseline:  Goal status:  GOAL MET  07/12/23 Target date:  05/12/2023  3.  Pt will progress with exercises per protocol without adverse effects for improved strength and mobility.   Baseline:  Goal status: GOAL MET  07/12/23 Target date:  05/26/2023   4.  Pt will report improved tolerance with standing activities and ambulation.  Baseline:  Goal status:  GOAL MET  07/12/23 Target date:  06/16/2023   5.  Pt will ambulate with a normalized heel to toe gait without limping.  Baseline:  Goal status:  PROGRESSING  09/06/23 Target date:  06/23/2023   6.  Pt will be able to perform a 6 inch step up with good form and control with L LE leading for improved performance of stairs abd and improved functional strength. Baseline:  Goal status: NOT MET  (limited by ulcers on feet) Target date:  06/30/2023   LONG TERM GOALS: Target date: 10/18/2023   Pt will be able to ambulate extended community distance without significant difficulty and pain.   Baseline:  Goal status: ONGOING   2.  Pt will be able to perform her ADLs and IADLs including household chores  without significant difficulty and pain. Baseline:  Goal status: ONGOING  3.  Pt will be able to take care of her husband without significant limitations.   Baseline:  Goal status:  GOAL MET  07/12/23  4.  Pt will be able to perform mini squats with good form and without increased pain in order for improved functional LE strength and to assist with returning to gardening activities.   Baseline:  Goal status: PROGRESSING      PLAN:  PT FREQUENCY:  2x/wk  PT DURATION: 6 weeks  PLANNED INTERVENTIONS: 97164- PT Re-evaluation, 97110-Therapeutic exercises, 97530- Therapeutic activity, [id number]- Neuromuscular re-education, 97535- Self Care, 16109- Manual therapy, [id number]- Gait training, [phone]- Aquatic Therapy, 97014- Electrical stimulation (unattended), [phone]- Ultrasound, Patient/Family education, Balance training, Stair training, Taping, Dry Needling, Joint mobilization, Scar mobilization, Cryotherapy, and Moist heat  PLAN FOR NEXT SESSION:  Cont per Dr. Verline Glow gluteus medius repair.  Be aware of healing ulcer in distal L LE at post heel/ankle.    Trina Fujita III PT, DPT 09/29/23 8:26 PM

## 2023-09-29 ENCOUNTER — Encounter (HOSPITAL_BASED_OUTPATIENT_CLINIC_OR_DEPARTMENT_OTHER): Payer: Self-pay | Admitting: Physical Therapy

## 2023-09-29 ENCOUNTER — Encounter (HOSPITAL_BASED_OUTPATIENT_CLINIC_OR_DEPARTMENT_OTHER): Admitting: Internal Medicine

## 2023-09-29 DIAGNOSIS — L97322 Non-pressure chronic ulcer of left ankle with fat layer exposed: Secondary | ICD-10-CM | POA: Diagnosis not present

## 2023-09-29 DIAGNOSIS — I872 Venous insufficiency (chronic) (peripheral): Secondary | ICD-10-CM | POA: Diagnosis not present

## 2023-09-29 DIAGNOSIS — L97312 Non-pressure chronic ulcer of right ankle with fat layer exposed: Secondary | ICD-10-CM | POA: Diagnosis not present

## 2023-09-30 ENCOUNTER — Encounter (HOSPITAL_BASED_OUTPATIENT_CLINIC_OR_DEPARTMENT_OTHER): Payer: Self-pay

## 2023-09-30 ENCOUNTER — Ambulatory Visit (HOSPITAL_BASED_OUTPATIENT_CLINIC_OR_DEPARTMENT_OTHER)

## 2023-09-30 DIAGNOSIS — R262 Difficulty in walking, not elsewhere classified: Secondary | ICD-10-CM

## 2023-09-30 DIAGNOSIS — M25552 Pain in left hip: Secondary | ICD-10-CM

## 2023-09-30 DIAGNOSIS — M25652 Stiffness of left hip, not elsewhere classified: Secondary | ICD-10-CM

## 2023-09-30 DIAGNOSIS — M6281 Muscle weakness (generalized): Secondary | ICD-10-CM

## 2023-09-30 NOTE — Therapy (Signed)
 OUTPATIENT PHYSICAL THERAPY TREATMENT           Patient Name: Ellen [name] MRN: [id number] DOB:13-Aug-1940, 83 y.o., female Today's Date: 09/30/2023    END OF SESSION: PT End of Session -  09/28/2023    Visit Number 27   Number of Visits 33   Date for PT Re-Evaluation 10/18/2023   Authorization Type MCR A&B   PT Start Time  1412   PT Stop Time 1457   PT Time Calculation (min) 45 min   Activity Tolerance Patient tolerated treatment well   Behavior During Therapy  WFL for tasks assessed/performed               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   Left Buccal Cheek (Keratoacanthoma)   Umbilical hernia    Past Surgical History:  Procedure Laterality Date   BOWEL RESECTION  07/09/2013   Procedure: SMALL BOWEL RESECTION;  Surgeon: Keitha Pata, MD;  Location: [facility];  Service: General;;   [name] REPAIR Left 03/22/2023   Procedure: LEFT GLUTEUS MEDIUS REPAIR WITH POSSIBLE COLLAGEN PATCH AUGMENTATION;  Surgeon: Wilhelmenia Harada, MD;  Location: [facility];  Service: Orthopedics;  Laterality: Left;   LAPAROTOMY N/A 07/09/2013   Procedure: EXPLORATORY LAPAROTOMY;  Surgeon: Keitha Pata, MD;  Location: [facility];  Service: General;  Laterality: N/A;   SHOULDER ARTHROSCOPY WITH CAPSULORRHAPHY     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   Hypokalemia 12/27/2022   Volume depletion 12/24/2022   Constipation 12/24/2022   Partial small bowel obstruction (HCC) 12/24/2022   Polycythemia 12/24/2022   Gastroesophageal reflux disease 08/26/2022   Bloating 08/26/2022   Small intestinal bacterial overgrowth  (SIBO) 08/26/2022   Osteoarthritis 12/01/2021   Plantar wart 12/01/2021   Ventral hernia 01/01/2021   Diverticulosis 01/05/2019   Dermatitis 01/05/2019   Systolic murmur 07/25/2018   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   Hyperglycemia 10/05/2017   Leg edema 10/05/2017   Hammer toe 10/05/2017   Hot flashes due to menopause 10/05/2017   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/10/2013 07/11/2013   Hypertension 07/09/2013    REFERRING PROVIDER: Wilhelmenia Harada, MD  REFERRING DIAG: [phone] (ICD-10-CM) - Tendinopathy of gluteus medius  S/p glute med repair  THERAPY DIAG:  Pain in left hip  Muscle weakness (generalized)  Stiffness of left hip, not elsewhere classified  Difficulty in walking, not elsewhere classified  Rationale for Evaluation and Treatment: Rehabilitation  ONSET DATE: DOS 03/22/2023  Days since surgery: 192   SUBJECTIVE:   SUBJECTIVE STATEMENT:  Pt reports increased hip pain after wound care appt for bil heels. They wrapped L LE which caused some pain. Has appt with Dr. 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  PAIN:  NPRS:  4/10 currently, 5/10 worst.  Worst pain is 1st thing in AM.  Location: L lateral hip  PRECAUTIONS: Other: per surgical protocol, Ulcer on L heel/post ankle/achilles, lumbar anterolisthesis, double hernia    WEIGHT BEARING RESTRICTIONS: Yes WBAT  FALLS:  Has patient fallen in last 6 months? No  LIVING ENVIRONMENT: Lives with: lives with their spouse Lives in: home; lives on 1 floor though does have to go to basement. Stairs: 15 stairs to get to basement Has following  equipment at home: Single point cane, Walker - 2 wheeled, Wheelchair (manual), and rollator  OCCUPATION:  Pt is semi-retired.  Pt is a Human resources officer.    PLOF: Independent  Pt performed a walking program and would walk 1-1.5 hours daily.  Pt was ambulating without AD.    PATIENT GOALS: to walk straight and walk normally, improve strength  NEXT MD VISIT: 05/07/2023  OBJECTIVE:  Note: Objective measures were completed at Evaluation unless otherwise noted.   DIAGNOSTIC FINDINGS: Pt is post op.     PATIENT SURVEYS:  4/21: FOTO Initial / Prior / Current:  59 / 49 /47/47 with a goal of 73 at visit 15.   Hip AROM: 4/21 Flex:  110 deg Abd:  24 deg ER:  32 deg IR:  23 deg   Strength: Pt able to perform S/L hip abduction independently Hip flexion:  R:  4+/5, L:  4/5   TODAY'S TREATMENT:     5/15 Nustep L5 UEs/LEs  S/L hip abduction 2x10 Bridges 2x10 3" hold LAQ 4# 3x10  Step ups 2 inch and 4 inch x 10reps each with UE support   Pt received L hip PROM in flexion, ER, and IR in supine per pt and tolerance Passive ITB Stretching  5/13  Hip abd strength:  R/L:  33.6 / 31.4 Pt tolerates moderate manual resistance with L hip abd in R S/L'ing without pain Pt reports having tenderness in bilat ITB worse on L. Gait:  Pt leans to the R with increased WB'ing thru R LE.  Pt reports 5-6/10 pain with ambulation.   Lateral band walks with hands on wall with RTB x 1 set and with YTB around thighs x 1 set  (10 steps) Step ups 4 inch step 2x10 with UE support on counter Hip hiking 2x10  Pt received L hip PROM in flexion, ER, and IR in supine per pt and tolerance   5/8 Nustep L5 UEs/LEs  S/L hip abduction 2x10 LAQ 4# 3x10 Hip hiking 2 x 10 bilat Step downs on 2 inch step with rail x10 Lateral band walks with YTB around thighs with UE support on rail x 2 laps Step ups 2 inch and 4 inch x 10 reps each with UE support  Marching on airex 2x10  Pt received L hip PROM in  flexion, ER, and IR in supine per pt and tolerance   5/5  Pt received L hip PROM in flexion, abd, ER, and IR in supine per pt and tolerance  S/L hip abduction 2x10 LAQ 4# 3x10 Hip hiking x 10 bilat Step downs on 2 inch step with rail x10 Sidestepping with UE support on rail x 2 laps Supine bridge 2x10 Manual modified thomas hip flexor stretch 3x30 sec  5/1 Reviewed pt presentation, pain levels, HEP compliance, and response to prior Rx.  Standing hip abduction x 10 bilat S/L hip abduction 2x10 LAQ 3# 2x15 Hip hiking x 10 bilat Step downs on 2 inch step with rail  2x10 Sidestepping with UE support on rail x 1 lap Manual modified thomas hip flexor stretch 2x30 sec  Pt received L hip PROM in flexion, ER, and IR in supine per pt and tolerance     PATIENT EDUCATION:  Education details:  dx, relevant anatomy, objective findings, HEP, POC, exercise form, and rationale of interventions. Person educated: Patient Education method: Explanation, Demonstration, Tactile cues, Verbal cues, and Handouts Education comprehension: verbalized understanding, returned demonstration, verbal cues required, tactile cues required, and needs further education  HOME EXERCISE PROGRAM: Access Code: [email or web address] URL: [email or web address] Date: 04/14/2023 Prepared by: Marnie Siren   ASSESSMENT:  CLINICAL IMPRESSION: Pt with good tolerance for L hip PROM. She did have some pain with 4" fwd step ups which improved after decreasing height to 2" step. Pt limited in PT due to ongoing hip pain. Pt has to cancel next PT appt due to wound care MD f/u.  Pt is planning to have an injection on 5/21.      OBJECTIVE IMPAIRMENTS: Abnormal gait, decreased activity tolerance, decreased endurance, decreased mobility, difficulty walking, decreased ROM, decreased strength, hypomobility, and pain.   ACTIVITY LIMITATIONS: carrying, lifting, standing, squatting, stairs, transfers, and locomotion  level  PARTICIPATION LIMITATIONS: cleaning and yard work  PERSONAL FACTORS: 3+ comorbidities: L LE ulcer, double hernia, arthritis are also affecting patient's functional outcome.   REHAB POTENTIAL: Good  CLINICAL DECISION MAKING: Stable/uncomplicated  EVALUATION COMPLEXITY: Low   GOALS:   SHORT TERM GOALS:  Pt will be independent and compliant with HEP for improved pain, strength, and function. Baseline: Goal status: MET Target date:  05/05/2023   2.  Pt will demo improved quality of gait with increased stance time and toe off on L.   Baseline:  Goal status:  GOAL MET  07/12/23 Target date:  05/12/2023  3.  Pt will progress with exercises per protocol without adverse effects for improved strength and mobility.   Baseline:  Goal status: GOAL MET  07/12/23 Target date:  05/26/2023   4.  Pt will report improved tolerance with standing activities and ambulation.  Baseline:  Goal status:  GOAL MET  07/12/23 Target date:  06/16/2023   5.  Pt will ambulate with a normalized heel to toe gait without limping.  Baseline:  Goal status:  PROGRESSING  09/06/23 Target date:  06/23/2023   6.  Pt will be able to perform a 6 inch step up with good form and control with L LE leading for improved performance of stairs abd and improved functional strength. Baseline:  Goal status: NOT MET  (limited by ulcers on feet) Target date:  06/30/2023   LONG TERM GOALS: Target date: 10/18/2023   Pt will be able to ambulate extended community distance without significant difficulty and pain.   Baseline:  Goal status: ONGOING   2.  Pt will be able to perform her ADLs and IADLs including household chores without significant difficulty and pain. Baseline:  Goal status: ONGOING  3.  Pt will be able to take care of her husband without significant limitations.   Baseline:  Goal status:  GOAL MET  07/12/23  4.  Pt will be able to perform mini squats with good form and without increased pain in order  for improved functional LE strength and to assist with returning to gardening activities.   Baseline:  Goal status: PROGRESSING      PLAN:  PT FREQUENCY:  2x/wk  PT DURATION: 6 weeks  PLANNED INTERVENTIONS: 97164- PT Re-evaluation, 97110-Therapeutic exercises, 97530- Therapeutic  activity, [id number]- Neuromuscular re-education, [phone]- Self Care, 60454- Manual therapy, [phone]- Gait training, [phone]- Aquatic Therapy, 97014- Electrical stimulation (unattended), [phone]- Ultrasound, Patient/Family education, Balance training, Stair training, Taping, Dry Needling, Joint mobilization, Scar mobilization, Cryotherapy, and Moist heat  PLAN FOR NEXT SESSION:  Cont per Dr. Verline Glow gluteus medius repair.  Be aware of healing ulcer in distal L LE at post heel/ankle.    Herb Loges, PTA  09/30/23 3:10 PM

## 2023-10-05 ENCOUNTER — Encounter (HOSPITAL_BASED_OUTPATIENT_CLINIC_OR_DEPARTMENT_OTHER): Admitting: General Surgery

## 2023-10-05 ENCOUNTER — Encounter (HOSPITAL_BASED_OUTPATIENT_CLINIC_OR_DEPARTMENT_OTHER): Admitting: Physical Therapy

## 2023-10-05 DIAGNOSIS — I872 Venous insufficiency (chronic) (peripheral): Secondary | ICD-10-CM | POA: Diagnosis not present

## 2023-10-05 DIAGNOSIS — L97312 Non-pressure chronic ulcer of right ankle with fat layer exposed: Secondary | ICD-10-CM | POA: Diagnosis not present

## 2023-10-05 DIAGNOSIS — L97322 Non-pressure chronic ulcer of left ankle with fat layer exposed: Secondary | ICD-10-CM | POA: Diagnosis not present

## 2023-10-06 ENCOUNTER — Ambulatory Visit (INDEPENDENT_AMBULATORY_CARE_PROVIDER_SITE_OTHER): Admitting: Orthopaedic Surgery

## 2023-10-06 DIAGNOSIS — M67959 Unspecified disorder of synovium and tendon, unspecified thigh: Secondary | ICD-10-CM | POA: Diagnosis not present

## 2023-10-06 NOTE — Progress Notes (Signed)
 Post Operative Evaluation    Procedure/Date of Surgery: Left hip gluteus medius repair 11/4  Interval History:    Presents today for follow-up of the left hip.  At this time she is still having persistent lateral based hip pain despite working on both at home program and physical therapy.  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   Left Buccal Cheek (Keratoacanthoma)   Umbilical hernia    Past Surgical History:  Procedure Laterality Date   BOWEL RESECTION  07/09/2013   Procedure: SMALL BOWEL RESECTION;  Surgeon: Keitha Pata, MD;  Location: [facility];  Service: General;;   [name] REPAIR Left 03/22/2023   Procedure: LEFT GLUTEUS MEDIUS REPAIR WITH POSSIBLE COLLAGEN PATCH AUGMENTATION;  Surgeon: Wilhelmenia Harada, MD;  Location: [facility];  Service: Orthopedics;  Laterality: Left;   LAPAROTOMY N/A 07/09/2013   Procedure: EXPLORATORY LAPAROTOMY;  Surgeon: Keitha Pata, MD;  Location: [facility];  Service: General;  Laterality: N/A;   SHOULDER ARTHROSCOPY WITH CAPSULORRHAPHY     Social History   Socioeconomic History   Marital status: Married    Spouse name: Not on file   Number of children: 0   Years of education: Not on file   Highest education level: Not on file  Occupational History   Occupation: Retired   Tobacco Use   Smoking status: Former    Types: Cigarettes   Smokeless tobacco: Never  Vaping Use   Vaping status: Never Used  Substance and Sexual Activity   Alcohol use: Yes    Alcohol/week: 7.0 standard drinks of alcohol    Types: 7 Glasses of wine per week    Comment:  nightly glass of wine   Drug use: No   Sexual activity: Not Currently  Other Topics Concern   Not on file  Social History Narrative   Enjoys gardening    Social Drivers of Health   Financial Resource Strain: Low Risk  (07/22/2023)   Overall Financial Resource Strain (CARDIA)    Difficulty of Paying Living Expenses: Not hard at all  Food Insecurity: No Food Insecurity (07/22/2023)   Hunger Vital Sign    Worried About Running Out of Food in the Last Year: Never true    Ran Out of Food in the Last Year: Never true  Transportation Needs: No Transportation Needs (07/22/2023)   PRAPARE - Administrator, Civil Service (Medical): No    Lack of Transportation (Non-Medical): No  Physical Activity: Sufficiently Active (07/22/2023)   Exercise Vital Sign    Days of Exercise per Week: 5 days    Minutes of Exercise per Session: 120 min  Stress: No Stress Concern Present (07/22/2023)   [employer] of Occupational Health - Occupational Stress Questionnaire    Feeling of Stress : Not at all  Social Connections: Moderately Integrated (07/22/2023)   Social Connection and Isolation Panel [NHANES]    Frequency  of Communication with Friends and Family: Once a week    Frequency of Social Gatherings with Friends and Family: More than three times a week    Attends Religious Services: More than 4 times per year    Active Member of [employer] or Organizations: No    Attends Engineer, structural: Never    Marital Status: Married   Family History  Problem Relation Age of Onset   Diabetes Mother    Heart attack Mother    CAD Father    Heart attack Father    Breast cancer Neg Hx    Colon cancer Neg Hx    Esophageal cancer Neg Hx    Rectal cancer Neg Hx    Stomach cancer Neg Hx    Allergies  Allergen Reactions   Lactose Intolerance (Gi) Diarrhea   Sulfa Antibiotics Nausea And Vomiting and Swelling   Current Outpatient Medications  Medication Sig Dispense Refill   Calcium  Carbonate  Antacid (TUMS PO) Take 1 tablet by mouth as needed (reflux).     Carboxymethylcellulose Sodium (EYE DROPS OP) Place 2 drops into both eyes daily. Thera Tears     doxycycline  (VIBRA -TABS) 100 MG tablet Take 1 tablet (100 mg total) by mouth 2 (two) times daily. 14 tablet 0   estrogen, conjugated,-medroxyprogesterone (PREMPRO ) 0.45-1.5 MG tablet Take 1 tablet by mouth daily. 28 tablet 0   furosemide  (LASIX ) 40 MG tablet Take 1 tablet (40 mg total) by mouth daily.     hydrochlorothiazide  (MICROZIDE ) 12.5 MG capsule TAKE 1 CAPSULE(12.5 MG) BY MOUTH DAILY 90 capsule 1   losartan  (COZAAR ) 50 MG tablet TAKE 1 TABLET(50 MG) BY MOUTH DAILY 90 tablet 1   mupirocin  ointment (BACTROBAN ) 2 % Apply 1 Application topically 2 (two) times daily. 30 g 3   neomycin-bacitracin-polymyxin [id number] OINT Apply 1 Application topically in the morning and at bedtime. 15 g 0   pantoprazole  (PROTONIX ) 40 MG tablet TAKE 1 TABLET(40 MG) BY MOUTH DAILY 90 tablet 0   No current facility-administered medications for this visit.   No results found.   Review of Systems:   A ROS was performed including pertinent positives and negatives as documented in the HPI.   Musculoskeletal Exam:    There were no vitals taken for this visit.  Left hip 30 degrees internal/external rotation without pain.  Incision is well-appearing without erythema or drainage.  Distal neurosensory exam is intact  Imaging:      I personally reviewed and interpreted the radiographs.   Assessment:   Status post left hip gluteus medius repair.  Unfortunately today's visit she is still having lateral based hip pain which has not progressed and she does still have a limp despite an additional 2 injections in the femoral acetabular joint and lateral trochanter.  Given this I do believe an MRI is needed to assess the status of her repair and to assess for any type of fatty degeneration that is causing her to have a limited recovery.  Will plan to  proceed with this and I will see her back following Plan :    - Plan for left hip MRI and follow-up to discuss results   I personally saw and evaluated the patient, and participated in the management and treatment plan.  Wilhelmenia Harada, MD Attending Physician, Orthopedic Surgery  This document was dictated using Dragon voice recognition software. A reasonable attempt at proof reading has been made to minimize errors.

## 2023-10-07 ENCOUNTER — Encounter (HOSPITAL_BASED_OUTPATIENT_CLINIC_OR_DEPARTMENT_OTHER)

## 2023-10-10 ENCOUNTER — Ambulatory Visit
Admission: RE | Admit: 2023-10-10 | Discharge: 2023-10-10 | Disposition: A | Discharge status: Home / Self Care | Source: Ambulatory Visit | Attending: Orthopaedic Surgery | Admitting: Orthopaedic Surgery

## 2023-10-10 DIAGNOSIS — M7062 Trochanteric bursitis, left hip: Secondary | ICD-10-CM | POA: Diagnosis not present

## 2023-10-10 DIAGNOSIS — M7602 Gluteal tendinitis, left hip: Secondary | ICD-10-CM | POA: Diagnosis not present

## 2023-10-10 DIAGNOSIS — M25552 Pain in left hip: Secondary | ICD-10-CM | POA: Diagnosis not present

## 2023-10-10 DIAGNOSIS — M67959 Unspecified disorder of synovium and tendon, unspecified thigh: Secondary | ICD-10-CM

## 2023-10-10 DIAGNOSIS — G8929 Other chronic pain: Secondary | ICD-10-CM | POA: Diagnosis not present

## 2023-10-12 ENCOUNTER — Encounter (HOSPITAL_BASED_OUTPATIENT_CLINIC_OR_DEPARTMENT_OTHER): Admitting: Physical Therapy

## 2023-10-14 ENCOUNTER — Encounter (HOSPITAL_BASED_OUTPATIENT_CLINIC_OR_DEPARTMENT_OTHER): Admitting: Physical Therapy

## 2023-10-15 ENCOUNTER — Encounter (HOSPITAL_BASED_OUTPATIENT_CLINIC_OR_DEPARTMENT_OTHER): Admitting: General Surgery

## 2023-10-15 DIAGNOSIS — L97312 Non-pressure chronic ulcer of right ankle with fat layer exposed: Secondary | ICD-10-CM | POA: Diagnosis not present

## 2023-10-15 DIAGNOSIS — L97322 Non-pressure chronic ulcer of left ankle with fat layer exposed: Secondary | ICD-10-CM | POA: Diagnosis not present

## 2023-10-15 DIAGNOSIS — I872 Venous insufficiency (chronic) (peripheral): Secondary | ICD-10-CM | POA: Diagnosis not present

## 2023-10-19 ENCOUNTER — Ambulatory Visit: Payer: Self-pay

## 2023-10-19 NOTE — Telephone Encounter (Signed)
 Copied from CRM [id number]. Topic: Clinical - Red Word Triage >> Oct 19, 2023  8:52 AM [place] E wrote: [facility] that prompted transfer to Nurse Triage: Patient slammed hand against wall about 2 weeks ago and now her right thumb is swollen. Was painful the first week, but the pain has kind of gone away this week.    Chief Complaint: Right thumb injury, swelling Symptoms: Pain 1/10 Frequency: 2 weeks ago Pertinent Negatives: Patient denies  Disposition: [] ED /[] Urgent Care (no appt availability in office) / [x] Appointment(In office/virtual)/ []  [place] [facility]/ [] Home Care/ [] Refused Recommended Disposition /[] [place] [facility]/ []  Follow-up with PCP Additional Notes: agrees with appointment.  Reason for Disposition  Large swelling or bruise  Answer Assessment - Initial Assessment Questions 1. MECHANISM: "How did the injury happen?"      Slammed against wall 2. ONSET: "When did the injury happen?" (Minutes or hours ago)      2 weeks 3. LOCATION: "What part of the finger is injured?" "Is the nail damaged?"      Right  4. APPEARANCE of the INJURY: "What does the injury look like?"      swelling 5. SEVERITY: "Can you use the hand normally?"  "Can you bend your fingers into a ball and then fully open them?"     yes 6. SIZE: For cuts, bruises, or swelling, ask: "How large is it?" (e.g., inches or centimeters;  entire finger)      no 7. PAIN: "Is there pain?" If Yes, ask: "How bad is the pain?"    (e.g., Scale 1-10; or mild, moderate, severe)  - NONE (0): no pain.  - MILD (1-3): doesn't interfere with normal activities.   - MODERATE (4-7): interferes with normal activities or awakens from sleep.  - SEVERE (8-10): excruciating pain, unable to hold a glass of water or bend finger even a little.     1 8. TETANUS: For any breaks in the skin, ask: "When was the last tetanus booster?"     N/a 9. OTHER SYMPTOMS: "Do you have any other symptoms?"     no 10. PREGNANCY: "Is there  any chance you are pregnant?" "When was your last menstrual period?"       no  Protocols used: Finger Injury-A-AH

## 2023-10-22 ENCOUNTER — Encounter (HOSPITAL_BASED_OUTPATIENT_CLINIC_OR_DEPARTMENT_OTHER): Attending: General Surgery | Admitting: General Surgery

## 2023-10-22 DIAGNOSIS — I872 Venous insufficiency (chronic) (peripheral): Secondary | ICD-10-CM | POA: Insufficient documentation

## 2023-10-22 DIAGNOSIS — L97322 Non-pressure chronic ulcer of left ankle with fat layer exposed: Secondary | ICD-10-CM | POA: Diagnosis not present

## 2023-10-25 ENCOUNTER — Ambulatory Visit

## 2023-10-25 ENCOUNTER — Encounter: Payer: Self-pay | Admitting: Family Medicine

## 2023-10-25 ENCOUNTER — Ambulatory Visit (HOSPITAL_COMMUNITY): Admission: RE | Admit: 2023-10-25 | Source: Ambulatory Visit

## 2023-10-25 ENCOUNTER — Ambulatory Visit: Payer: Self-pay | Admitting: Family Medicine

## 2023-10-25 ENCOUNTER — Ambulatory Visit (INDEPENDENT_AMBULATORY_CARE_PROVIDER_SITE_OTHER): Admitting: Family Medicine

## 2023-10-25 ENCOUNTER — Ambulatory Visit (INDEPENDENT_AMBULATORY_CARE_PROVIDER_SITE_OTHER)

## 2023-10-25 VITALS — BP 145/72 | HR 75 | Temp 97.5°F | Ht 61.0 in | Wt 111.6 lb

## 2023-10-25 DIAGNOSIS — M79645 Pain in left finger(s): Secondary | ICD-10-CM | POA: Diagnosis not present

## 2023-10-25 DIAGNOSIS — I10 Essential (primary) hypertension: Secondary | ICD-10-CM

## 2023-10-25 DIAGNOSIS — S6991XA Unspecified injury of right wrist, hand and finger(s), initial encounter: Secondary | ICD-10-CM | POA: Diagnosis not present

## 2023-10-25 DIAGNOSIS — M79644 Pain in right finger(s): Secondary | ICD-10-CM

## 2023-10-25 DIAGNOSIS — M1811 Unilateral primary osteoarthritis of first carpometacarpal joint, right hand: Secondary | ICD-10-CM | POA: Diagnosis not present

## 2023-10-25 DIAGNOSIS — L97301 Non-pressure chronic ulcer of unspecified ankle limited to breakdown of skin: Secondary | ICD-10-CM | POA: Diagnosis not present

## 2023-10-25 NOTE — Patient Instructions (Signed)
 It was very nice to see you today!  I think you probably have a ganglion cyst in your thumb.  We will check an x-ray today.  Let us  know if you would like a referral to see a hand specialist for injection for this.  Please monitor your blood pressure and let us  know if persistently elevated.  Return if symptoms worsen or fail to improve.   Take care, Dr Daneil Dunker  PLEASE NOTE:  If you had any lab tests, please let us  know if you have not heard back within a few days. You may see your results on mychart before we have a chance to review them but we will give you a call once they are reviewed by us .   If we ordered any referrals today, please let us  know if you have not heard from their office within the next week.   If you had any urgent prescriptions sent in today, please check with the pharmacy within an hour of our visit to make sure the prescription was transmitted appropriately.   Please try these tips to maintain a healthy lifestyle:  Eat at least 3 REAL meals and 1-2 snacks per day.  Aim for no more than 5 hours between eating.  If you eat breakfast, please do so within one hour of getting up.   Each meal should contain half fruits/vegetables, one quarter protein, and one quarter carbs (no bigger than a computer mouse)  Cut down on sweet beverages. This includes juice, soda, and sweet tea.   Drink at least 1 glass of water with each meal and aim for at least 8 glasses per day  Exercise at least 150 minutes every week.

## 2023-10-25 NOTE — Assessment & Plan Note (Signed)
 Mildly elevated.  Usually well-controlled at home.  She will continue losartan  50 mg daily and HCTZ 12.5 mg daily.  She can monitor at home and let us  know if persistently elevated.

## 2023-10-25 NOTE — Assessment & Plan Note (Signed)
 She is doing much better with wound care.  The ulcer on her right ankle has resolved.  Still has a small open area on left ankle but this is improving.

## 2023-10-25 NOTE — Addendum Note (Signed)
 Addended by: Rodney Clamp on: 10/25/2023 09:56 AM   Modules accepted: Orders

## 2023-10-25 NOTE — Progress Notes (Signed)
 Her x-ray shows severe arthritis but no other significant abnormalities.  As we discussed at her office visit, I believe she has a ganglion cyst to the area.  She should let us  know if she would like a referral to see a hand specialist for this.

## 2023-10-25 NOTE — Progress Notes (Signed)
   Ellen [name] is a 83 y.o. female who presents today for an office visit.  Assessment/Plan:  New/Acute Problems: Right thumb pain Likely suffered contusion to the area from her recent trauma.  Will check x-ray to rule out fracture.  She does have a small freely mobile mass adjacent to her first MCP joint which is likely ganglion cyst.  We are checking imaging today as above though did recommend referral to hand surgeon for injection as/or aspiration.  She would like to hold off on this for now.  She will let us  know if she changes her mind.  We discussed reasons to return to care.  Chronic Problems Addressed Today: Hypertension Mildly elevated.  Usually well-controlled at home.  She will continue losartan  50 mg daily and HCTZ 12.5 mg daily.  She can monitor at home and let us  know if persistently elevated.  Skin ulcer of ankle, limited to breakdown of skin [facility]) She is doing much better with wound care.  The ulcer on her right ankle has resolved.  Still has a small open area on left ankle but this is improving.     Subjective:  HPI:  See Assessment / plan for status of chronic conditions.  Patient here with right thumb pain. She injured it couple of weeks ago while walking and accidentally struck it against the wall. She had pain for a day or so which then resolved.  She did have some bruising to the area initially.  Pain has resolved for the most part though has noticed a painful lump along the inner aspect of her right.       Objective:  Physical Exam: BP (!) 145/72   Pulse 75   Temp (!) 97.5 F (36.4 C) (Temporal)   Ht 5\' 1"  (1.549 m)   Wt 111 lb 9.6 oz (50.6 kg)   SpO2 99%   BMI 21.09 kg/m   Gen: No acute distress, resting comfortably CV: Regular rate and rhythm with no murmurs appreciated Pulm: Normal work of breathing, clear to auscultation bilaterally with no crackles, wheezes, or rhonchi MUSCULOSKELETAL: Right thumb with degenerative changes noted.  Fullness  noted along medial first MCP joint.  Freely mobile mass approximately 8 to 10 mm in diameter along medial joint space.  Nontender to palpation. Neuro: Grossly normal, moves all extremities Psych: Normal affect and thought content      Etana Beets M. Daneil Dunker, MD 10/25/2023 9:55 AM

## 2023-10-27 ENCOUNTER — Other Ambulatory Visit (HOSPITAL_BASED_OUTPATIENT_CLINIC_OR_DEPARTMENT_OTHER): Payer: Self-pay

## 2023-10-27 ENCOUNTER — Ambulatory Visit (HOSPITAL_BASED_OUTPATIENT_CLINIC_OR_DEPARTMENT_OTHER): Admitting: Orthopaedic Surgery

## 2023-10-27 ENCOUNTER — Ambulatory Visit (HOSPITAL_BASED_OUTPATIENT_CLINIC_OR_DEPARTMENT_OTHER): Payer: Self-pay | Admitting: Orthopaedic Surgery

## 2023-10-27 DIAGNOSIS — M67959 Unspecified disorder of synovium and tendon, unspecified thigh: Secondary | ICD-10-CM

## 2023-10-27 MED ORDER — ACETAMINOPHEN 500 MG PO TABS: 500.0000 mg | ORAL_TABLET | Freq: Three times a day (TID) | ORAL | 0 refills | Status: AC

## 2023-10-27 MED ORDER — ASPIRIN 325 MG PO TBEC: 325.0000 mg | DELAYED_RELEASE_TABLET | Freq: Every day | ORAL | 0 refills | Status: DC

## 2023-10-27 NOTE — Progress Notes (Signed)
 Post Operative Evaluation    Procedure/Date of Surgery: Left hip gluteus medius repair 11/4  Interval History:    Presents today for follow-up of the left hip.  At this time she is still having persistent pain about the lateral hip.  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   Left Buccal Cheek (Keratoacanthoma)   Umbilical hernia    Past Surgical History:  Procedure Laterality Date   BOWEL RESECTION  07/09/2013   Procedure: SMALL BOWEL RESECTION;  Surgeon: Keitha Pata, MD;  Location: [facility];  Service: General;;   [name] REPAIR Left 03/22/2023   Procedure: LEFT GLUTEUS MEDIUS REPAIR WITH POSSIBLE COLLAGEN PATCH AUGMENTATION;  Surgeon: Wilhelmenia Harada, MD;  Location: [facility];  Service: Orthopedics;  Laterality: Left;   LAPAROTOMY N/A 07/09/2013   Procedure: EXPLORATORY LAPAROTOMY;  Surgeon: Keitha Pata, MD;  Location: [facility];  Service: General;  Laterality: N/A;   SHOULDER ARTHROSCOPY WITH CAPSULORRHAPHY     Social History   Socioeconomic History   Marital status: Married    Spouse name: Not on file   Number of children: 0   Years of education: Not on file   Highest education level: Not on file  Occupational History   Occupation: Retired   Tobacco Use   Smoking status: Former    Types: Cigarettes   Smokeless tobacco: Never  Vaping Use   Vaping status: Never Used  Substance and Sexual Activity   Alcohol use: Yes    Alcohol/week: 7.0 standard drinks of alcohol    Types: 7 Glasses of wine per week    Comment: nightly glass of wine   Drug use: No   Sexual activity: Not Currently  Other Topics Concern   Not on file  Social  History Narrative   Enjoys gardening    Social Drivers of Health   Financial Resource Strain: Low Risk  (07/22/2023)   Overall Financial Resource Strain (CARDIA)    Difficulty of Paying Living Expenses: Not hard at all  Food Insecurity: No Food Insecurity (07/22/2023)   Hunger Vital Sign    Worried About Running Out of Food in the Last Year: Never true    Ran Out of Food in the Last Year: Never true  Transportation Needs: No Transportation Needs (07/22/2023)   PRAPARE - Administrator, Civil Service (Medical): No    Lack of Transportation (Non-Medical): No  Physical Activity: Sufficiently Active (07/22/2023)   Exercise Vital Sign    Days of Exercise per Week: 5 days    Minutes of Exercise per Session: 120 min  Stress: No Stress Concern Present (07/22/2023)   [employer] of Occupational Health - Occupational Stress Questionnaire    Feeling of Stress : Not at all  Social Connections: Moderately Integrated (07/22/2023)   Social Connection and Isolation Panel [NHANES]    Frequency of Communication with Friends and Family: Once a week    Frequency of Social Gatherings with Friends and Family: More  than three times a week    Attends Religious Services: More than 4 times per year    Active Member of Clubs or Organizations: No    Attends Banker Meetings: Never    Marital Status: Married   Family History  Problem Relation Age of Onset   Diabetes Mother    Heart attack Mother    CAD Father    Heart attack Father    Breast cancer Neg Hx    Colon cancer Neg Hx    Esophageal cancer Neg Hx    Rectal cancer Neg Hx    Stomach cancer Neg Hx    Allergies  Allergen Reactions   Lactose Intolerance (Gi) Diarrhea   Sulfa Antibiotics Nausea And Vomiting and Swelling   Current Outpatient Medications  Medication Sig Dispense Refill   acetaminophen  (TYLENOL ) 500 MG tablet Take 1 tablet (500 mg total) by mouth every 8 (eight) hours for 10 days. 30 tablet 0   aspirin  EC 325  MG tablet Take 1 tablet (325 mg total) by mouth daily. 14 tablet 0   Calcium  Carbonate Antacid (TUMS PO) Take 1 tablet by mouth as needed (reflux).     Carboxymethylcellulose Sodium (EYE DROPS OP) Place 2 drops into both eyes daily. Thera Tears     furosemide  (LASIX ) 40 MG tablet Take 1 tablet (40 mg total) by mouth daily.     hydrochlorothiazide  (MICROZIDE ) 12.5 MG capsule TAKE 1 CAPSULE(12.5 MG) BY MOUTH DAILY 90 capsule 1   losartan  (COZAAR ) 50 MG tablet TAKE 1 TABLET(50 MG) BY MOUTH DAILY 90 tablet 1   pantoprazole  (PROTONIX ) 40 MG tablet TAKE 1 TABLET(40 MG) BY MOUTH DAILY 90 tablet 0   No current facility-administered medications for this visit.   No results found.   Review of Systems:   A ROS was performed including pertinent positives and negatives as documented in the HPI.   Musculoskeletal Exam:    There were no vitals taken for this visit.  Left hip 30 degrees internal/external rotation without pain.  Incision is well-appearing without erythema or drainage.  Distal neurosensory exam is intact  Imaging:      I personally reviewed and interpreted the radiographs.   Assessment:   Status post left hip gluteus medius repair.  Unfortunately today's visit she is still having lateral based hip pain which has not progressed and she does still have a limp despite an additional 2 injections in the femoral acetabular joint and lateral trochanter.  MRI does confirm undersurface persistent tearing at the gluteus medius with gluteus minimus atrophy.  Given the fact that she is not recovering with extensive physical therapy as well as subsequent injections about the lateral hip I have advised that I would recommend additional intervention.  We did discuss the possibility of gluteus maximus tendon transfer.  I did discuss risks and limitations as well as associated recovery with this.  After discussion she would like to proceed Plan :    - Plan for left hip hip gluteus maximus tendon  transfer   After a lengthy discussion of treatment options, including risks, benefits, alternatives, complications of surgical and nonsurgical conservative options, the patient elected surgical repair.   The patient  is aware of the material risks  and complications including, but not limited to injury to adjacent structures, neurovascular injury, infection, numbness, bleeding, implant failure, thermal burns, stiffness, persistent pain, failure to heal, disease transmission from allograft, need for further surgery, dislocation, anesthetic risks, blood clots, risks of death,and others. The  probabilities of surgical success and failure discussed with patient given their particular co-morbidities.The time and nature of expected rehabilitation and recovery was discussed.The patient's questions were all answered preoperatively.  No barriers to understanding were noted. I explained the natural history of the disease process and Rx rationale.  I explained to the patient what I considered to be reasonable expectations given their personal situation.  The final treatment plan was arrived at through a shared patient decision making process model.    I personally saw and evaluated the patient, and participated in the management and treatment plan.  Wilhelmenia Harada, MD Attending Physician, Orthopedic Surgery  This document was dictated using Dragon voice recognition software. A reasonable attempt at proof reading has been made to minimize errors.

## 2023-10-29 ENCOUNTER — Other Ambulatory Visit: Payer: Self-pay | Admitting: *Deleted

## 2023-10-29 ENCOUNTER — Telehealth: Payer: Self-pay | Admitting: *Deleted

## 2023-10-29 DIAGNOSIS — M79644 Pain in right finger(s): Secondary | ICD-10-CM

## 2023-10-29 NOTE — Telephone Encounter (Signed)
 Referral for hand surgeon send

## 2023-11-01 ENCOUNTER — Telehealth: Payer: Self-pay | Admitting: Family Medicine

## 2023-11-01 NOTE — Telephone Encounter (Signed)
**   pt scheduled   Patient dropped off document Surgical Clearance, to be filled out by provider. Patient requested to send it back via Fax . Document is located in providers tray at front office.Please advise

## 2023-11-04 ENCOUNTER — Ambulatory Visit (INDEPENDENT_AMBULATORY_CARE_PROVIDER_SITE_OTHER): Admitting: Family Medicine

## 2023-11-04 ENCOUNTER — Encounter: Payer: Self-pay | Admitting: Family Medicine

## 2023-11-04 VITALS — BP 128/70 | HR 71 | Temp 97.5°F | Ht 61.0 in | Wt 113.6 lb

## 2023-11-04 DIAGNOSIS — M1990 Unspecified osteoarthritis, unspecified site: Secondary | ICD-10-CM | POA: Diagnosis not present

## 2023-11-04 DIAGNOSIS — R739 Hyperglycemia, unspecified: Secondary | ICD-10-CM | POA: Diagnosis not present

## 2023-11-04 DIAGNOSIS — I10 Essential (primary) hypertension: Secondary | ICD-10-CM

## 2023-11-04 MED ORDER — DICLOFENAC SODIUM 75 MG PO TBEC: 75.0000 mg | DELAYED_RELEASE_TABLET | Freq: Two times a day (BID) | ORAL | 0 refills | Status: DC

## 2023-11-04 NOTE — Assessment & Plan Note (Signed)
 Pain in right hand likely secondary to osteoarthritis.  Recommended Voltaren .  Also start diclofenac .  She has upcoming appointment with hand specialist in a few weeks.

## 2023-11-04 NOTE — Patient Instructions (Signed)
 It was very nice to see you today!  We wil complete your surgical clearance form today.  Please try using over-the-counter Voltaren  gel for your hand.  Also start diclofenac  by mouth.  Return if symptoms worsen or fail to improve.   Take care, Dr Daneil Dunker  PLEASE NOTE:  If you had any lab tests, please let us  know if you have not heard back within a few days. You may see your results on mychart before we have a chance to review them but we will give you a call once they are reviewed by us .   If we ordered any referrals today, please let us  know if you have not heard from their office within the next week.   If you had any urgent prescriptions sent in today, please check with the pharmacy within an hour of our visit to make sure the prescription was transmitted appropriately.   Please try these tips to maintain a healthy lifestyle:  Eat at least 3 REAL meals and 1-2 snacks per day.  Aim for no more than 5 hours between eating.  If you eat breakfast, please do so within one hour of getting up.   Each meal should contain half fruits/vegetables, one quarter protein, and one quarter carbs (no bigger than a computer mouse)  Cut down on sweet beverages. This includes juice, soda, and sweet tea.   Drink at least 1 glass of water with each meal and aim for at least 8 glasses per day  Exercise at least 150 minutes every week.

## 2023-11-04 NOTE — Progress Notes (Signed)
   Ellen [name] is a 83 y.o. female who presents today for an office visit.  Assessment/Plan:  New/Acute Problems: Encounter for surgical clearance /gluteus medius tendinopathy Patient overall low risk for complications from upcoming surgery.  She is optimized from medical standpoint.  Will complete her preop form today and fax to her surgeon's office.  Chronic Problems Addressed Today: Osteoarthritis Pain in right hand likely secondary to osteoarthritis.  Recommended Voltaren .  Also start diclofenac .  She has upcoming appointment with hand specialist in a few weeks.  Hypertension At goal today on losartan  50 mg daily and HCTZ 12.5 mg daily.  Hyperglycemia Last A1c 5.4 without medications.      Subjective:  HPI:  See A/P for status of chronic conditions.  Patient is here today for surgical clearance.  She will be having a left hip gluteal maximus tendon repair soon.  She has been following with orthopedics for this.  She had this procedure performed about 7 months ago.  She has not had relief from symptoms as expected and they will be repeating this procedure soon.  She is also still having ongoing pain and puffiness in her right hand.  I saw her a few weeks ago for right thumb pain.  We obtained plain film at that time which showed severe arthritis in her [facility] joint.  She has been trying to take over-the-counter medications without much improvement.       Objective:  Physical Exam: BP 128/70   Pulse 71   Temp (!) 97.5 F (36.4 C) (Temporal)   Ht 5' 1 (1.549 m)   Wt 113 lb 9.6 oz (51.5 kg)   SpO2 98%   BMI 21.46 kg/m   Gen: No acute distress, resting comfortably CV: Regular rate and rhythm with soft systolic murmur appreciated Pulm: Normal work of breathing, clear to auscultation bilaterally with no crackles, wheezes, or rhonchi MUSCULOSKELETAL - right Hand: Bony hypertrophy noted.  Slight digit edema noted.  Neurovascular intact distally. Neuro: Grossly normal,  moves all extremities Psych: Normal affect and thought content      Zaila Crew M. Daneil Dunker, MD 11/04/2023 9:01 AM

## 2023-11-04 NOTE — Telephone Encounter (Signed)
 Form faxed to [phone] with last visit notes

## 2023-11-04 NOTE — Assessment & Plan Note (Signed)
 Last A1c 5.4 without medications.

## 2023-11-04 NOTE — Assessment & Plan Note (Signed)
At goal today on losartan 50 mg daily and HCTZ 12.5 mg daily.

## 2023-11-05 ENCOUNTER — Encounter (HOSPITAL_BASED_OUTPATIENT_CLINIC_OR_DEPARTMENT_OTHER): Admitting: General Surgery

## 2023-11-05 DIAGNOSIS — I872 Venous insufficiency (chronic) (peripheral): Secondary | ICD-10-CM | POA: Diagnosis not present

## 2023-11-05 DIAGNOSIS — L97322 Non-pressure chronic ulcer of left ankle with fat layer exposed: Secondary | ICD-10-CM | POA: Diagnosis not present

## 2023-11-15 ENCOUNTER — Encounter (HOSPITAL_BASED_OUTPATIENT_CLINIC_OR_DEPARTMENT_OTHER): Admitting: General Surgery

## 2023-11-15 DIAGNOSIS — L97322 Non-pressure chronic ulcer of left ankle with fat layer exposed: Secondary | ICD-10-CM | POA: Diagnosis not present

## 2023-11-15 DIAGNOSIS — I872 Venous insufficiency (chronic) (peripheral): Secondary | ICD-10-CM | POA: Diagnosis not present

## 2023-11-17 ENCOUNTER — Other Ambulatory Visit (HOSPITAL_BASED_OUTPATIENT_CLINIC_OR_DEPARTMENT_OTHER): Payer: Self-pay | Admitting: Orthopaedic Surgery

## 2023-11-17 DIAGNOSIS — M67959 Unspecified disorder of synovium and tendon, unspecified thigh: Secondary | ICD-10-CM

## 2023-11-22 ENCOUNTER — Encounter (HOSPITAL_BASED_OUTPATIENT_CLINIC_OR_DEPARTMENT_OTHER): Attending: General Surgery | Admitting: General Surgery

## 2023-11-22 DIAGNOSIS — L03116 Cellulitis of left lower limb: Secondary | ICD-10-CM | POA: Diagnosis not present

## 2023-11-22 DIAGNOSIS — Z87891 Personal history of nicotine dependence: Secondary | ICD-10-CM | POA: Insufficient documentation

## 2023-11-22 DIAGNOSIS — L03115 Cellulitis of right lower limb: Secondary | ICD-10-CM | POA: Diagnosis not present

## 2023-11-22 DIAGNOSIS — I872 Venous insufficiency (chronic) (peripheral): Secondary | ICD-10-CM | POA: Diagnosis not present

## 2023-11-24 ENCOUNTER — Other Ambulatory Visit: Payer: Self-pay

## 2023-11-24 ENCOUNTER — Encounter (HOSPITAL_BASED_OUTPATIENT_CLINIC_OR_DEPARTMENT_OTHER): Payer: Self-pay | Admitting: Orthopaedic Surgery

## 2023-11-25 ENCOUNTER — Encounter (HOSPITAL_BASED_OUTPATIENT_CLINIC_OR_DEPARTMENT_OTHER)
Admission: RE | Admit: 2023-11-25 | Discharge: 2023-11-25 | Disposition: A | Discharge status: Home / Self Care | Source: Ambulatory Visit | Attending: Orthopaedic Surgery | Admitting: Orthopaedic Surgery

## 2023-11-25 DIAGNOSIS — R2231 Localized swelling, mass and lump, right upper limb: Secondary | ICD-10-CM | POA: Diagnosis not present

## 2023-11-25 DIAGNOSIS — Z01812 Encounter for preprocedural laboratory examination: Secondary | ICD-10-CM | POA: Insufficient documentation

## 2023-11-25 DIAGNOSIS — M1811 Unilateral primary osteoarthritis of first carpometacarpal joint, right hand: Secondary | ICD-10-CM | POA: Diagnosis not present

## 2023-11-25 LAB — BASIC METABOLIC PANEL WITH GFR
Anion gap: 9 (ref 5–15)
BUN: 23 mg/dL (ref 8–23)
CO2: 26 mmol/L (ref 22–32)
Calcium: 8.9 mg/dL (ref 8.9–10.3)
Chloride: 104 mmol/L (ref 98–111)
Creatinine, Ser: 0.83 mg/dL (ref 0.44–1.00)
GFR, Estimated: >60 mL/min (ref >60)
Glucose, Bld: 123 mg/dL — ABNORMAL HIGH (ref 70–99)
Potassium: 3.7 mmol/L (ref 3.5–5.1)
Sodium: 139 mmol/L (ref 135–145)

## 2023-11-25 NOTE — Progress Notes (Signed)

## 2023-11-29 ENCOUNTER — Telehealth (HOSPITAL_BASED_OUTPATIENT_CLINIC_OR_DEPARTMENT_OTHER): Payer: Self-pay | Admitting: Orthopaedic Surgery

## 2023-11-29 NOTE — Anesthesia Preprocedure Evaluation (Signed)
 Anesthesia Evaluation    Reviewed: Allergy & Precautions, Patient's Chart, lab work & pertinent test results  Airway        Dental   Pulmonary neg pulmonary ROS, former smoker          Cardiovascular hypertension,      Neuro/Psych   Anxiety      Neuromuscular disease    GI/Hepatic ,GERD  Medicated and Controlled,,  Endo/Other    Renal/GU      Musculoskeletal  (+) Arthritis ,    Abdominal   Peds  Hematology   Anesthesia Other Findings   Reproductive/Obstetrics                              Anesthesia Physical Anesthesia Plan  ASA: 3  Anesthesia Plan: General   Post-op Pain Management: Precedex and Ofirmev  IV (intra-op)*   Induction: Intravenous  PONV Risk Score and Plan: Treatment may vary due to age or medical condition, Ondansetron  and Propofol  infusion  Airway Management Planned: Oral ETT  Additional Equipment: None  Intra-op Plan:   Post-operative Plan: Extubation in OR  Informed Consent:      Dental advisory given  Plan Discussed with: CRNA and Surgeon  Anesthesia Plan Comments:         Anesthesia Quick Evaluation

## 2023-11-29 NOTE — Telephone Encounter (Signed)
 Patient has surgery tomorrow and she needs to cx she has a basement full of water

## 2023-11-30 ENCOUNTER — Encounter (HOSPITAL_BASED_OUTPATIENT_CLINIC_OR_DEPARTMENT_OTHER): Payer: Self-pay | Admitting: Anesthesiology

## 2023-11-30 ENCOUNTER — Encounter (HOSPITAL_BASED_OUTPATIENT_CLINIC_OR_DEPARTMENT_OTHER): Admission: RE | Payer: Self-pay | Source: Home / Self Care

## 2023-11-30 ENCOUNTER — Ambulatory Visit (HOSPITAL_BASED_OUTPATIENT_CLINIC_OR_DEPARTMENT_OTHER): Admission: RE | Admit: 2023-11-30 | Source: Home / Self Care | Admitting: Orthopaedic Surgery

## 2023-11-30 DIAGNOSIS — R600 Localized edema: Secondary | ICD-10-CM

## 2023-11-30 DIAGNOSIS — I10 Essential (primary) hypertension: Secondary | ICD-10-CM

## 2023-11-30 HISTORY — DX: Personal history of urinary calculi: Z87.442

## 2023-11-30 SURGERY — REPAIR, TENDON, GLUTEUS MINIMUS
Anesthesia: General | Laterality: Left

## 2023-11-30 NOTE — Telephone Encounter (Signed)
 April B notified and surgery has been cancelled

## 2023-12-03 ENCOUNTER — Ambulatory Visit (HOSPITAL_BASED_OUTPATIENT_CLINIC_OR_DEPARTMENT_OTHER): Admitting: Physical Therapy

## 2023-12-06 ENCOUNTER — Encounter (HOSPITAL_BASED_OUTPATIENT_CLINIC_OR_DEPARTMENT_OTHER): Admitting: General Surgery

## 2023-12-06 DIAGNOSIS — I872 Venous insufficiency (chronic) (peripheral): Secondary | ICD-10-CM | POA: Diagnosis not present

## 2023-12-06 DIAGNOSIS — L03116 Cellulitis of left lower limb: Secondary | ICD-10-CM | POA: Diagnosis not present

## 2023-12-06 DIAGNOSIS — L03115 Cellulitis of right lower limb: Secondary | ICD-10-CM | POA: Diagnosis not present

## 2023-12-06 DIAGNOSIS — Z87891 Personal history of nicotine dependence: Secondary | ICD-10-CM | POA: Diagnosis not present

## 2023-12-09 ENCOUNTER — Encounter (HOSPITAL_BASED_OUTPATIENT_CLINIC_OR_DEPARTMENT_OTHER): Admitting: Physical Therapy

## 2023-12-15 ENCOUNTER — Encounter (HOSPITAL_BASED_OUTPATIENT_CLINIC_OR_DEPARTMENT_OTHER): Admitting: Orthopaedic Surgery

## 2023-12-16 ENCOUNTER — Encounter (HOSPITAL_BASED_OUTPATIENT_CLINIC_OR_DEPARTMENT_OTHER): Admitting: Orthopaedic Surgery

## 2023-12-16 ENCOUNTER — Encounter (HOSPITAL_BASED_OUTPATIENT_CLINIC_OR_DEPARTMENT_OTHER): Admitting: Physical Therapy

## 2023-12-23 ENCOUNTER — Ambulatory Visit (HOSPITAL_BASED_OUTPATIENT_CLINIC_OR_DEPARTMENT_OTHER): Attending: Orthopaedic Surgery | Admitting: Physical Therapy

## 2023-12-23 ENCOUNTER — Telehealth (HOSPITAL_BASED_OUTPATIENT_CLINIC_OR_DEPARTMENT_OTHER): Payer: Self-pay | Admitting: Orthopaedic Surgery

## 2023-12-23 NOTE — Telephone Encounter (Signed)
 Patient said Pt from [facility] called her and wants to know when she will start PT after her surgery

## 2023-12-23 NOTE — Therapy (Incomplete)
 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   Left Buccal Cheek (Keratoacanthoma)   Umbilical hernia    Past Surgical History:  Procedure Laterality Date   BOWEL RESECTION  07/09/2013   Procedure: SMALL BOWEL RESECTION;  Surgeon: Krystal CHRISTELLA Spinner, MD;  Location: [facility];  Service: General;;   RODDIE NEVIN REPAIR Left 03/22/2023   Procedure: LEFT GLUTEUS MEDIUS REPAIR WITH POSSIBLE COLLAGEN PATCH AUGMENTATION;  Surgeon: Genelle Standing, MD;  Location: [facility];  Service: Orthopedics;  Laterality: Left;   LAPAROTOMY N/A 07/09/2013   Procedure: EXPLORATORY LAPAROTOMY;  Surgeon: Krystal CHRISTELLA Spinner, MD;  Location: [facility];  Service: General;  Laterality: N/A;   SHOULDER ARTHROSCOPY WITH CAPSULORRHAPHY     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   Hypokalemia 12/27/2022   Volume depletion 12/24/2022   Constipation 12/24/2022   Partial small bowel obstruction (HCC) 12/24/2022   Polycythemia 12/24/2022   Gastroesophageal reflux disease 08/26/2022   Bloating 08/26/2022   Small intestinal bacterial overgrowth (SIBO) 08/26/2022   Osteoarthritis 12/01/2021   Plantar wart 12/01/2021   Ventral hernia 01/01/2021   Diverticulosis 01/05/2019   Dermatitis 01/05/2019   Systolic murmur 07/25/2018   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   Hyperglycemia 10/05/2017   Leg edema 10/05/2017   Hammer toe 10/05/2017   Hot flashes due to menopause 10/05/2017    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/10/2013 07/11/2013   Hypertension 07/09/2013    PCP: ***  REFERRING PROVIDER: Genelle Standing, MD   REFERRING DIAG: [phone] (ICD-10-CM) - Tendinopathy of gluteus medius   THERAPY DIAG:  No diagnosis found.  Rationale for Evaluation and Treatment: Rehabilitation  ONSET DATE: ***  SUBJECTIVE:   SUBJECTIVE STATEMENT: Pt underwent Left hip gluteus medius repair and trochanteric bursectomy on 03/22/2023.  Pt received PT and was last seen on 09/30/23.  She continued to have hip pain.  Pt received 2 injections and continued to still have pain.  Pt returned to MD and he ordered a MRI.  MD note indicates MRI does confirm undersurface persistent tearing at the gluteus medius with gluteus minimus atrophy.  MD note--We did discuss the possibility of gluteus maximus tendon transfer.   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  ----right thumb CMC arthritis and MCP ganglion ----  PAIN:  Are you having pain? {OPRCPAIN:27236}  PRECAUTIONS: {Therapy precautions:24002}  RED FLAGS: {PT Red Flags:29287}   WEIGHT BEARING RESTRICTIONS: {Yes ***/No:24003}  FALLS:  Has patient fallen in last 6 months? {fallsyesno:27318}  LIVING ENVIRONMENT: Lives with: {OPRC lives with:25569::lives with their family} Lives in: {Lives in:25570} Stairs: {opstairs:27293} Has following equipment at home: {Assistive devices:23999}  OCCUPATION: ***  PLOF: {PLOF:24004}  PATIENT GOALS: ***  NEXT MD VISIT: ***  OBJECTIVE:  Note: Objective measures were completed at Evaluation unless otherwise noted.  DIAGNOSTIC FINDINGS: MRI on 10/10/23: IMPRESSION: 1. Progressive  fluid signal tracking between the gluteus minimus and within and along the gluteus medius muscle extending down to moderate gluteus medius tendinopathy and peritendinitis. Mild to moderate left gluteus minimus tendinopathy distally. 2. Trace left trochanteric bursitis. 3. Moderate degenerative hip arthropathy bilaterally. 4. Proximal hamstring tendinopathy and likely partial tearing similar to prior. 5. Substantial loss of intervertebral disc height at L4-5 and L5-S1 with type 2 degenerative endplate findings eccentric to the right at L4-5. 6. Intramural uterine fibroids. 7. Sigmoid colon diverticulosis.  PATIENT SURVEYS:  {rehab surveys:24030}  COGNITION: Overall cognitive status: {cognition:24006}     SENSATION: {sensation:27233}  EDEMA:  {edema:24020}  MUSCLE LENGTH: Hamstrings: Right *** deg; Left *** deg Debby test: Right *** deg; Left *** deg  POSTURE: {posture:25561}  PALPATION: ***  LOWER EXTREMITY ROM:  {AROM/PROM:27142} ROM Right eval Left eval  Hip flexion    Hip extension    Hip abduction    Hip adduction    Hip internal rotation    Hip external rotation    Knee flexion    Knee extension    Ankle dorsiflexion    Ankle plantarflexion    Ankle inversion    Ankle eversion     (Blank rows = not tested)  LOWER EXTREMITY MMT:  MMT Right eval Left eval  Hip flexion    Hip extension    Hip abduction    Hip adduction    Hip internal rotation    Hip external rotation    Knee flexion    Knee extension    Ankle dorsiflexion    Ankle plantarflexion    Ankle inversion    Ankle eversion     (Blank rows = not tested)  LOWER EXTREMITY SPECIAL TESTS:  {LEspecialtests:26242}  FUNCTIONAL TESTS:  {Functional tests:24029}  GAIT: Distance walked: *** Assistive device utilized: {Assistive devices:23999} Level of assistance: {Levels of assistance:24026} Comments: ***                                                                                                                                 TREATMENT DATE: ***    PATIENT EDUCATION:  Education details: *** Person educated: {Person educated:25204} Education method: {Education Method:25205} Education comprehension: {Education Comprehension:25206}  HOME EXERCISE PROGRAM: ***  ASSESSMENT:  CLINICAL IMPRESSION: Patient is a *** y.o. *** who was seen today for physical therapy evaluation and treatment for ***.   OBJECTIVE IMPAIRMENTS: {opptimpairments:25111}.   ACTIVITY LIMITATIONS: {activitylimitations:27494}  PARTICIPATION LIMITATIONS: {participationrestrictions:25113}  PERSONAL FACTORS: {Personal factors:25162} are also affecting patient's functional outcome.   REHAB POTENTIAL: {rehabpotential:25112}  CLINICAL DECISION MAKING: {clinical decision making:25114}  EVALUATION COMPLEXITY: {Evaluation complexity:25115}   GOALS: Goals reviewed with patient? {yes/no:20286}  SHORT TERM GOALS: Target date: *** *** Baseline: Goal status: INITIAL  2.  *** Baseline:  Goal status: INITIAL  3.  *** Baseline:  Goal status: INITIAL  4.  *** Baseline:  Goal status: INITIAL  5.  *** Baseline:  Goal status: INITIAL  6.  *** Baseline:  Goal status: INITIAL  LONG TERM GOALS: Target date: ***  *** Baseline:  Goal status: INITIAL  2.  *** Baseline:  Goal status: INITIAL  3.  *** Baseline:  Goal status: INITIAL  4.  *** Baseline:  Goal status: INITIAL  5.  *** Baseline:  Goal status: INITIAL  6.  *** Baseline:  Goal status: INITIAL   PLAN:  PT FREQUENCY: {rehab frequency:25116}  PT DURATION: {rehab duration:25117}  PLANNED INTERVENTIONS: {rehab planned interventions:25118::97110-Therapeutic exercises,97530- Therapeutic [phone]- Neuromuscular re-education,97535- Self Rjmz,02859- Manual therapy}  PLAN FOR NEXT SESSION: PIERRETTE Mose Minerva, PT 12/23/2023, 6:29 AM

## 2023-12-28 ENCOUNTER — Encounter (HOSPITAL_BASED_OUTPATIENT_CLINIC_OR_DEPARTMENT_OTHER)

## 2023-12-28 ENCOUNTER — Other Ambulatory Visit: Payer: Self-pay

## 2023-12-28 ENCOUNTER — Encounter (HOSPITAL_BASED_OUTPATIENT_CLINIC_OR_DEPARTMENT_OTHER): Payer: Self-pay | Admitting: Orthopaedic Surgery

## 2023-12-30 ENCOUNTER — Encounter (HOSPITAL_BASED_OUTPATIENT_CLINIC_OR_DEPARTMENT_OTHER)
Admission: RE | Admit: 2023-12-30 | Discharge: 2023-12-30 | Disposition: A | Discharge status: Home / Self Care | Source: Ambulatory Visit | Attending: Orthopaedic Surgery | Admitting: Orthopaedic Surgery

## 2023-12-30 ENCOUNTER — Encounter (HOSPITAL_BASED_OUTPATIENT_CLINIC_OR_DEPARTMENT_OTHER): Admitting: Physical Therapy

## 2023-12-30 DIAGNOSIS — Z85828 Personal history of other malignant neoplasm of skin: Secondary | ICD-10-CM | POA: Diagnosis not present

## 2023-12-30 DIAGNOSIS — S76012A Strain of muscle, fascia and tendon of left hip, initial encounter: Secondary | ICD-10-CM | POA: Diagnosis not present

## 2023-12-30 DIAGNOSIS — M67952 Unspecified disorder of synovium and tendon, left thigh: Secondary | ICD-10-CM | POA: Diagnosis present

## 2023-12-30 DIAGNOSIS — M1990 Unspecified osteoarthritis, unspecified site: Secondary | ICD-10-CM | POA: Diagnosis not present

## 2023-12-30 DIAGNOSIS — Z87891 Personal history of nicotine dependence: Secondary | ICD-10-CM | POA: Diagnosis not present

## 2023-12-30 DIAGNOSIS — K219 Gastro-esophageal reflux disease without esophagitis: Secondary | ICD-10-CM | POA: Diagnosis not present

## 2023-12-30 DIAGNOSIS — I10 Essential (primary) hypertension: Secondary | ICD-10-CM | POA: Diagnosis not present

## 2023-12-30 DIAGNOSIS — X58XXXA Exposure to other specified factors, initial encounter: Secondary | ICD-10-CM | POA: Diagnosis not present

## 2023-12-30 LAB — BASIC METABOLIC PANEL WITH GFR
Anion gap: 8 (ref 5–15)
BUN: 18 mg/dL (ref 8–23)
CO2: 25 mmol/L (ref 22–32)
Calcium: 9.1 mg/dL (ref 8.9–10.3)
Chloride: 106 mmol/L (ref 98–111)
Creatinine, Ser: 0.7 mg/dL (ref 0.44–1.00)
GFR, Estimated: >60 mL/min (ref >60)
Glucose, Bld: 103 mg/dL — ABNORMAL HIGH (ref 70–99)
Potassium: 4.4 mmol/L (ref 3.5–5.1)
Sodium: 139 mmol/L (ref 135–145)

## 2024-01-03 NOTE — Anesthesia Preprocedure Evaluation (Signed)
 Anesthesia Evaluation  Patient identified by MRN, date of birth, ID band Patient awake    Reviewed: Allergy & Precautions, NPO status , Patient's Chart, lab work & pertinent test results  Airway Mallampati: III  TM Distance: >3 FB Neck ROM: Limited    Dental  (+) Dental Advisory Given, Caps   Pulmonary neg shortness of breath, former smoker   Pulmonary exam normal breath sounds clear to auscultation       Cardiovascular hypertension, Pt. on medications (-) DOE + Valvular Problems/Murmurs AS  Rhythm:Regular Rate:Normal + Systolic murmurs Echo 2018 - Left ventricle: Systolic function was vigorous. The estimated    ejection fraction was in the range of 65% to 70%. Doppler    parameters are consistent with abnormal left ventricular    relaxation (grade 1 diastolic dysfunction).  - Aortic valve: AV is mildly thickened, calcified with ristricted    motion Peak and mean graidients through the valve are 17 and 10    mm Hg respectively.consistent with mild AS>     Neuro/Psych   Anxiety      Neuromuscular disease    GI/Hepatic ,GERD  Medicated and Controlled,,  Endo/Other    Renal/GU      Musculoskeletal  (+) Arthritis ,    Abdominal   Peds  Hematology   Anesthesia Other Findings   Reproductive/Obstetrics                              Anesthesia Physical Anesthesia Plan  ASA: 3  Anesthesia Plan: General   Post-op Pain Management: Tylenol  PO (pre-op)* and Gabapentin  PO (pre-op)*   Induction: Intravenous  PONV Risk Score and Plan: 4 or greater and Treatment may vary due to age or medical condition, Ondansetron  and Dexamethasone   Airway Management Planned: Oral ETT  Additional Equipment: None  Intra-op Plan:   Post-operative Plan: Extubation in OR  Informed Consent: I have reviewed the patients History and Physical, chart, labs and discussed the procedure including the risks, benefits  and alternatives for the proposed anesthesia with the patient or authorized representative who has indicated his/her understanding and acceptance.     Dental advisory given  Plan Discussed with: CRNA  Anesthesia Plan Comments:          Anesthesia Quick Evaluation

## 2024-01-04 ENCOUNTER — Encounter (HOSPITAL_BASED_OUTPATIENT_CLINIC_OR_DEPARTMENT_OTHER)

## 2024-01-04 ENCOUNTER — Ambulatory Visit (HOSPITAL_BASED_OUTPATIENT_CLINIC_OR_DEPARTMENT_OTHER): Payer: Self-pay | Admitting: Anesthesiology

## 2024-01-04 ENCOUNTER — Ambulatory Visit (HOSPITAL_BASED_OUTPATIENT_CLINIC_OR_DEPARTMENT_OTHER)
Admission: RE | Admit: 2024-01-04 | Discharge: 2024-01-04 | Disposition: A | Discharge status: Home / Self Care | Attending: Orthopaedic Surgery | Admitting: Orthopaedic Surgery

## 2024-01-04 ENCOUNTER — Encounter (HOSPITAL_BASED_OUTPATIENT_CLINIC_OR_DEPARTMENT_OTHER): Payer: Self-pay | Admitting: Orthopaedic Surgery

## 2024-01-04 ENCOUNTER — Other Ambulatory Visit: Payer: Self-pay

## 2024-01-04 ENCOUNTER — Encounter (HOSPITAL_BASED_OUTPATIENT_CLINIC_OR_DEPARTMENT_OTHER)
Admission: RE | Disposition: A | Discharge status: Home / Self Care | Payer: Self-pay | Source: Home / Self Care | Attending: Orthopaedic Surgery

## 2024-01-04 DIAGNOSIS — I10 Essential (primary) hypertension: Secondary | ICD-10-CM

## 2024-01-04 DIAGNOSIS — Z85828 Personal history of other malignant neoplasm of skin: Secondary | ICD-10-CM | POA: Insufficient documentation

## 2024-01-04 DIAGNOSIS — M67959 Unspecified disorder of synovium and tendon, unspecified thigh: Secondary | ICD-10-CM

## 2024-01-04 DIAGNOSIS — S76012A Strain of muscle, fascia and tendon of left hip, initial encounter: Secondary | ICD-10-CM | POA: Diagnosis not present

## 2024-01-04 DIAGNOSIS — M67952 Unspecified disorder of synovium and tendon, left thigh: Secondary | ICD-10-CM

## 2024-01-04 DIAGNOSIS — Z87891 Personal history of nicotine dependence: Secondary | ICD-10-CM | POA: Diagnosis not present

## 2024-01-04 DIAGNOSIS — F419 Anxiety disorder, unspecified: Secondary | ICD-10-CM | POA: Diagnosis not present

## 2024-01-04 DIAGNOSIS — M1990 Unspecified osteoarthritis, unspecified site: Secondary | ICD-10-CM | POA: Insufficient documentation

## 2024-01-04 DIAGNOSIS — K219 Gastro-esophageal reflux disease without esophagitis: Secondary | ICD-10-CM | POA: Insufficient documentation

## 2024-01-04 DIAGNOSIS — X58XXXA Exposure to other specified factors, initial encounter: Secondary | ICD-10-CM | POA: Insufficient documentation

## 2024-01-04 HISTORY — PX: GLUTEUS MINIMUS REPAIR: SHX5843

## 2024-01-04 SURGERY — REPAIR, TENDON, GLUTEUS MINIMUS
Anesthesia: General | Laterality: Left

## 2024-01-04 MED ORDER — PROPOFOL 10 MG/ML IV BOLUS
INTRAVENOUS | Status: DC | PRN
  Administered 2024-01-04: 80 mg via INTRAVENOUS

## 2024-01-04 MED ORDER — KETOROLAC TROMETHAMINE 30 MG/ML IJ SOLN
INTRAMUSCULAR | Status: DC | PRN
  Administered 2024-01-04: 15 mg via INTRAVENOUS

## 2024-01-04 MED ORDER — FENTANYL CITRATE (PF) 100 MCG/2ML IJ SOLN
INTRAMUSCULAR | Status: AC
  Filled 2024-01-04: qty 2

## 2024-01-04 MED ORDER — GABAPENTIN 300 MG PO CAPS
300.0000 mg | ORAL_CAPSULE | Freq: Once | ORAL | Status: AC
  Administered 2024-01-04: 300 mg via ORAL

## 2024-01-04 MED ORDER — PHENYLEPHRINE HCL (PRESSORS) 10 MG/ML IV SOLN
INTRAVENOUS | Status: DC | PRN
Start: 2024-01-04 — End: 2024-01-04
  Administered 2024-01-04 (×2): 80 ug via INTRAVENOUS

## 2024-01-04 MED ORDER — KETOROLAC TROMETHAMINE 30 MG/ML IJ SOLN
INTRAMUSCULAR | Status: AC
Start: 2024-01-04 — End: 2024-01-04
  Filled 2024-01-04: qty 4

## 2024-01-04 MED ORDER — CEFAZOLIN SODIUM-DEXTROSE 2-4 GM/100ML-% IV SOLN
INTRAVENOUS | Status: AC
Start: 2024-01-04 — End: 2024-01-04
  Filled 2024-01-04: qty 100

## 2024-01-04 MED ORDER — TRANEXAMIC ACID-NACL 1000-0.7 MG/100ML-% IV SOLN
INTRAVENOUS | Status: AC
  Filled 2024-01-04: qty 100

## 2024-01-04 MED ORDER — OXYCODONE HCL 5 MG/5ML PO SOLN: 5.0000 mg | Freq: Once | ORAL | Status: DC | PRN

## 2024-01-04 MED ORDER — FENTANYL CITRATE (PF) 100 MCG/2ML IJ SOLN
INTRAMUSCULAR | Status: DC | PRN
  Administered 2024-01-04: 50 ug via INTRAVENOUS

## 2024-01-04 MED ORDER — CEFAZOLIN SODIUM-DEXTROSE 2-4 GM/100ML-% IV SOLN
2.0000 g | INTRAVENOUS | Status: AC
  Administered 2024-01-04: 2 g via INTRAVENOUS

## 2024-01-04 MED ORDER — LIDOCAINE HCL (CARDIAC) PF 100 MG/5ML IV SOSY
PREFILLED_SYRINGE | INTRAVENOUS | Status: DC | PRN
  Administered 2024-01-04: 50 mg via INTRAVENOUS

## 2024-01-04 MED ORDER — OXYCODONE HCL 5 MG PO TABS: 5.0000 mg | ORAL_TABLET | Freq: Once | ORAL | Status: DC | PRN

## 2024-01-04 MED ORDER — PROPOFOL 500 MG/50ML IV EMUL
INTRAVENOUS | Status: AC
  Filled 2024-01-04: qty 100

## 2024-01-04 MED ORDER — TRANEXAMIC ACID-NACL 1000-0.7 MG/100ML-% IV SOLN
1000.0000 mg | INTRAVENOUS | Status: AC
  Administered 2024-01-04: 1000 mg via INTRAVENOUS

## 2024-01-04 MED ORDER — ACETAMINOPHEN 500 MG PO TABS
1000.0000 mg | ORAL_TABLET | Freq: Once | ORAL | Status: AC
  Administered 2024-01-04: 1000 mg via ORAL

## 2024-01-04 MED ORDER — DROPERIDOL 2.5 MG/ML IJ SOLN: 0.6250 mg | Freq: Once | INTRAMUSCULAR | Status: DC | PRN

## 2024-01-04 MED ORDER — FENTANYL CITRATE (PF) 100 MCG/2ML IJ SOLN
25.0000 ug | INTRAMUSCULAR | Status: DC | PRN
  Administered 2024-01-04 (×3): 25 ug via INTRAVENOUS

## 2024-01-04 MED ORDER — GABAPENTIN 300 MG PO CAPS
ORAL_CAPSULE | ORAL | Status: AC
  Filled 2024-01-04: qty 1

## 2024-01-04 MED ORDER — ROCURONIUM BROMIDE 100 MG/10ML IV SOLN
INTRAVENOUS | Status: DC | PRN
  Administered 2024-01-04: 40 mg via INTRAVENOUS

## 2024-01-04 MED ORDER — BUPIVACAINE HCL 0.25 % IJ SOLN
INTRAMUSCULAR | Status: DC | PRN
  Administered 2024-01-04: 20 mL

## 2024-01-04 MED ORDER — LACTATED RINGERS IV SOLN: INTRAVENOUS | Status: DC

## 2024-01-04 MED ORDER — SUGAMMADEX SODIUM 200 MG/2ML IV SOLN
INTRAVENOUS | Status: DC | PRN
  Administered 2024-01-04: 100 mg via INTRAVENOUS

## 2024-01-04 MED ORDER — ONDANSETRON HCL 4 MG/2ML IJ SOLN
INTRAMUSCULAR | Status: DC | PRN
  Administered 2024-01-04: 4 mg via INTRAVENOUS

## 2024-01-04 MED ORDER — ACETAMINOPHEN 500 MG PO TABS
ORAL_TABLET | ORAL | Status: AC
  Filled 2024-01-04: qty 2

## 2024-01-04 MED ORDER — 0.9 % SODIUM CHLORIDE (POUR BTL) OPTIME
TOPICAL | Status: DC | PRN
  Administered 2024-01-04: 300 mL

## 2024-01-04 SURGICAL SUPPLY — 47 items
ANCHOR JUGGERKNOT SOFT 2.9 (Anchor) IMPLANT
ANCHOR SUT QUATTRO KNTLS 4.5 (Anchor) IMPLANT
BLADE SURG 10 STRL SS (BLADE) ×1 IMPLANT
BLADE SURG 15 STRL LF DISP TIS (BLADE) ×1 IMPLANT
CANISTER SUCT 1200ML W/VALVE (MISCELLANEOUS) ×1 IMPLANT
CHLORAPREP W/TINT 26 (MISCELLANEOUS) ×1 IMPLANT
CLSR STERI-STRIP ANTIMIC 1/2X4 (GAUZE/BANDAGES/DRESSINGS) IMPLANT
COOLER ICEMAN CLASSIC (MISCELLANEOUS) ×1 IMPLANT
COVER BACK TABLE 60X90IN (DRAPES) ×1 IMPLANT
COVER MAYO STAND STRL (DRAPES) ×1 IMPLANT
DERMABOND ADVANCED .7 DNX12 (GAUZE/BANDAGES/DRESSINGS) IMPLANT
DRAPE STERI IOBAN 125X83 (DRAPES) ×1 IMPLANT
DRAPE U-SHAPE 47X51 STRL (DRAPES) ×2 IMPLANT
DRSG AQUACEL AG ADV 3.5X 6 (GAUZE/BANDAGES/DRESSINGS) ×1 IMPLANT
DRSG AQUACEL AG ADV 3.5X10 (GAUZE/BANDAGES/DRESSINGS) IMPLANT
ELECTRODE BLDE 4.0 EZ CLN MEGD (MISCELLANEOUS) IMPLANT
ELECTRODE REM PT RTRN 9FT ADLT (ELECTROSURGICAL) ×1 IMPLANT
GAUZE PAD ABD 8X10 STRL (GAUZE/BANDAGES/DRESSINGS) IMPLANT
GAUZE SPONGE 4X4 12PLY STRL (GAUZE/BANDAGES/DRESSINGS) IMPLANT
GAUZE XEROFORM 1X8 LF (GAUZE/BANDAGES/DRESSINGS) IMPLANT
GLOVE BIO SURGEON STRL SZ 6 (GLOVE) ×2 IMPLANT
GLOVE BIO SURGEON STRL SZ7.5 (GLOVE) ×2 IMPLANT
GLOVE BIOGEL PI IND STRL 6.5 (GLOVE) ×1 IMPLANT
GLOVE BIOGEL PI IND STRL 8 (GLOVE) ×1 IMPLANT
GOWN STRL REUS W/ TWL LRG LVL3 (GOWN DISPOSABLE) ×2 IMPLANT
GOWN STRL REUS W/TWL XL LVL3 (GOWN DISPOSABLE) ×1 IMPLANT
MANIFOLD NEPTUNE II (INSTRUMENTS) ×1 IMPLANT
NDL HYPO 22X1.5 SAFETY MO (MISCELLANEOUS) ×1 IMPLANT
NEEDLE HYPO 22X1.5 SAFETY MO (MISCELLANEOUS) ×1 IMPLANT
NS IRRIG 1000ML POUR BTL (IV SOLUTION) ×1 IMPLANT
PACK BASIN DAY SURGERY FS (CUSTOM PROCEDURE TRAY) ×1 IMPLANT
PAD COLD SHLDR WRAP-ON (PAD) ×1 IMPLANT
PENCIL SMOKE EVACUATOR (MISCELLANEOUS) ×1 IMPLANT
SLEEVE SCD COMPRESS KNEE MED (STOCKING) ×1 IMPLANT
SPIKE FLUID TRANSFER (MISCELLANEOUS) IMPLANT
SPONGE T-LAP 18X18 ~~LOC~~+RFID (SPONGE) ×1 IMPLANT
SUCTION TUBE FRAZIER 10FR DISP (SUCTIONS) ×1 IMPLANT
SUT ETHILON 3 0 PS 1 (SUTURE) IMPLANT
SUT MNCRL AB 3-0 PS2 27 (SUTURE) IMPLANT
SUT VIC AB 0 CT1 27XBRD ANBCTR (SUTURE) ×1 IMPLANT
SUT VIC AB 2-0 CT1 TAPERPNT 27 (SUTURE) ×1 IMPLANT
SYR 20ML LL LF (SYRINGE) ×1 IMPLANT
SYR BULB EAR ULCER 3OZ GRN STR (SYRINGE) ×1 IMPLANT
TOWEL GREEN STERILE FF (TOWEL DISPOSABLE) ×2 IMPLANT
TUBE CONNECTING 20X1/4 (TUBING) ×1 IMPLANT
UNDERPAD 30X36 HEAVY ABSORB (UNDERPADS AND DIAPERS) IMPLANT
YANKAUER SUCT BULB TIP NO VENT (SUCTIONS) ×1 IMPLANT

## 2024-01-04 NOTE — Discharge Instructions (Addendum)
 Discharge Instructions    Attending Surgeon: Elspeth Parker, MD Office Phone Number: [phone]   Diagnosis and Procedures:    Surgeries Performed: Left hip gluteus maximus tendon transfer  Discharge Plan:    Diet: Resume usual diet. Begin with light or bland foods.  Drink plenty of fluids.  Activity:  Weight bearing as tolerated left leg. You are advised to go home directly from the hospital or surgical center. Restrict your activities.  GENERAL INSTRUCTIONS: 1.  Please apply ice to your wound to help with swelling and inflammation. This will improve your comfort and your overall recovery following surgery.     2. Please call Dr. Danetta office at [phone] with questions Monday-Friday during business hours. If no one answers, please leave a message and someone should get back to the patient within 24 hours. For emergencies please call 911 or proceed to the emergency room.   3. Patient to notify surgical team if experiences any of the following: Bowel/Bladder dysfunction, uncontrolled pain, nerve/muscle weakness, incision with increased drainage or redness, nausea/vomiting and Fever greater than 101.0 F.  Be alert for signs of infection including redness, streaking, odor, fever or chills. Be alert for excessive pain or bleeding and notify your surgeon immediately.  WOUND INSTRUCTIONS:   Leave your dressing, cast, or splint in place until your post operative visit.  Keep it clean and dry.  Always keep the incision clean and dry until the staples/sutures are removed. If there is no drainage from the incision you should keep it open to air. If there is drainage from the incision you must keep it covered at all times until the drainage stops  Do not soak in a bath tub, hot tub, pool, lake or other body of water until 21 days after your surgery and your incision is completely dry and healed.  If you have removable sutures (or staples) they must be removed 10-14 days  (unless otherwise instructed) from the day of your surgery.     1)  Elevate the extremity as much as possible.  2)  Keep the dressing clean and dry.  3)  Please call us  if the dressing becomes wet or dirty.  4)  If you are experiencing worsening pain or worsening swelling, please call.     MEDICATIONS: Resume all previous home medications at the previous prescribed dose and frequency unless otherwise noted Start taking the  pain medications on an as-needed basis as prescribed  Please taper down pain medication over the next week following surgery.  Ideally you should not require a refill of any narcotic pain medication.  Take pain medication with food to minimize nausea. In addition to the prescribed pain medication, you may take over-the-counter pain relievers such as Tylenol .  Do NOT take additional tylenol  if your pain medication already has tylenol  in it.  Aspirin  325mg  daily per instructions on bottle. Narcotic policy: Per [facility] clinic policy, our goal is ensure optimal postoperative pain control with a multimodal pain management strategy. For all [facility] patients, our goal is to wean post-operative narcotic medications by 6 weeks post-operatively, and many times sooner. If this is not possible due to utilization of pain medication prior to surgery, your [facility] doctor will support your acute post-operative pain control for the first 6 weeks postoperatively, with a plan to transition you back to your primary pain team following that. Maralee will work to ensure a Therapist, occupational.       FOLLOWUP INSTRUCTIONS: 1. Follow up at the  Physical Therapy Clinic 3-4 days following surgery. This appointment should be scheduled unless other arrangements have been made.The Physical Therapy scheduling number is [phone] if an appointment has not already been arranged.  2. Contact Dr. Danetta office during office hours at [phone] or the practice after hours line at [phone] for  non-emergencies. For medical emergencies call 911.   Discharge Location: Home    Post Anesthesia Home Care Instructions  Activity: Get plenty of rest for the remainder of the day. A responsible individual must stay with you for 24 hours following the procedure.  For the next 24 hours, DO NOT: -Drive a car -Advertising copywriter -Drink alcoholic beverages -Take any medication unless instructed by your physician -Make any legal decisions or sign important papers.  Meals: Start with liquid foods such as gelatin or soup. Progress to regular foods as tolerated. Avoid greasy, spicy, heavy foods. If nausea and/or vomiting occur, drink only clear liquids until the nausea and/or vomiting subsides. Call your physician if vomiting continues.  Special Instructions/Symptoms: Your throat may feel dry or sore from the anesthesia or the breathing tube placed in your throat during surgery. If this causes discomfort, gargle with warm salt water. The discomfort should disappear within 24 hours.  If you had a scopolamine patch placed behind your ear for the management of post- operative nausea and/or vomiting:  1. The medication in the patch is effective for 72 hours, after which it should be removed.  Wrap patch in a tissue and discard in the trash. Wash hands thoroughly with soap and water. 2. You may remove the patch earlier than 72 hours if you experience unpleasant side effects which may include dry mouth, dizziness or visual disturbances. 3. Avoid touching the patch. Wash your hands with soap and water after contact with the patch.     No ibuprofen/NSAIDs until after 7pm  No tylenol  until after 615pm

## 2024-01-04 NOTE — Anesthesia Procedure Notes (Addendum)
 Procedure Name: Intubation Date/Time: 01/04/2024 10:26 AM  Performed by: Donnell Berwyn SQUIBB, CRNAPre-anesthesia Checklist: Patient identified, Emergency Drugs available, Suction available, Patient being monitored and Timeout performed Patient Re-evaluated:Patient Re-evaluated prior to induction Oxygen Delivery Method: Circle system utilized Preoxygenation: Pre-oxygenation with 100% oxygen Induction Type: IV induction Ventilation: Mask ventilation without difficulty and Oral airway inserted - appropriate to patient size Laryngoscope Size: Mac and 3 Grade View: Grade II Tube type: Oral Tube size: 7.0 mm Number of attempts: 1 Airway Equipment and Method: Stylet Placement Confirmation: ETT inserted through vocal cords under direct vision, positive ETCO2 and breath sounds checked- equal and bilateral Secured at: 20 cm Tube secured with: Tape Dental Injury: Teeth and Oropharynx as per pre-operative assessment

## 2024-01-04 NOTE — H&P (Signed)
 Post Operative Evaluation      Procedure/Date of Surgery: Left hip gluteus medius repair 11/4   Interval History:      Presents today for follow-up of the left hip.  At this time she is still having persistent pain about the lateral hip.  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    Left Buccal Cheek (Keratoacanthoma)   Umbilical hernia               Past Surgical History:  Procedure Laterality Date   BOWEL RESECTION   07/09/2013    Procedure: SMALL BOWEL RESECTION;  Surgeon: Krystal CHRISTELLA Spinner, MD;  Location: [facility];  Service: General;;   RODDIE NEVIN REPAIR Left 03/22/2023    Procedure: LEFT GLUTEUS MEDIUS REPAIR WITH POSSIBLE COLLAGEN PATCH AUGMENTATION;  Surgeon: Genelle Standing, MD;  Location: [facility];  Service: Orthopedics;  Laterality: Left;   LAPAROTOMY N/A 07/09/2013    Procedure: EXPLORATORY LAPAROTOMY;  Surgeon: Krystal CHRISTELLA Spinner, MD;  Location: [facility];  Service: General;  Laterality: N/A;   SHOULDER ARTHROSCOPY WITH CAPSULORRHAPHY            Social History         Socioeconomic History   Marital status: Married      Spouse name: Not on file   Number of children: 0   Years of education: Not on file   Highest education level: Not on file  Occupational History   Occupation: Retired   Tobacco Use   Smoking status: Former      Types: Cigarettes   Smokeless tobacco: Never  Vaping Use   Vaping status: Never Used  Substance and Sexual Activity   Alcohol use: Yes      Alcohol/week: 7.0 standard drinks of alcohol      Types: 7 Glasses of wine per week      Comment: nightly glass of wine   Drug use: No   Sexual activity: Not  Currently  Other Topics Concern   Not on file  Social History Narrative    Enjoys gardening     Social Drivers of Health        Financial Resource Strain: Low Risk  (07/22/2023)    Overall Financial Resource Strain (CARDIA)     Difficulty of Paying Living Expenses: Not hard at all  Food Insecurity: No Food Insecurity (07/22/2023)    Hunger Vital Sign     Worried About Running Out of Food in the Last Year: Never true     Ran Out of Food in the Last Year: Never true  Transportation Needs: No Transportation Needs (07/22/2023)    PRAPARE - Therapist, art (Medical): No     Lack of Transportation (Non-Medical): No  Physical Activity: Sufficiently Active (07/22/2023)    Exercise Vital Sign     Days of Exercise per Week: 5 days     Minutes of Exercise per Session: 120 min  Stress: No Stress Concern Present (07/22/2023)    [employer] of Occupational Health - Occupational Stress  Questionnaire     Feeling of Stress : Not at all  Social Connections: Moderately Integrated (07/22/2023)    Social Connection and Isolation Panel [NHANES]     Frequency of Communication with Friends and Family: Once a week     Frequency of Social Gatherings with Friends and Family: More than three times a week     Attends Religious Services: More than 4 times per year     Active Member of [employer] or Organizations: No     Attends Engineer, structural: Never     Marital Status: Married         Family History  Problem Relation Age of Onset   Diabetes Mother     Heart attack Mother     CAD Father     Heart attack Father     Breast cancer Neg Hx     Colon cancer Neg Hx     Esophageal cancer Neg Hx     Rectal cancer Neg Hx     Stomach cancer Neg Hx          Allergies      Allergies  Allergen Reactions   Lactose Intolerance (Gi) Diarrhea   Sulfa Antibiotics Nausea And Vomiting and Swelling            Current Outpatient Medications  Medication Sig Dispense Refill    acetaminophen  (TYLENOL ) 500 MG tablet Take 1 tablet (500 mg total) by mouth every 8 (eight) hours for 10 days. 30 tablet 0   aspirin  EC 325 MG tablet Take 1 tablet (325 mg total) by mouth daily. 14 tablet 0   Calcium  Carbonate Antacid (TUMS PO) Take 1 tablet by mouth as needed (reflux).       Carboxymethylcellulose Sodium (EYE DROPS OP) Place 2 drops into both eyes daily. Thera Tears       furosemide  (LASIX ) 40 MG tablet Take 1 tablet (40 mg total) by mouth daily.       hydrochlorothiazide  (MICROZIDE ) 12.5 MG capsule TAKE 1 CAPSULE(12.5 MG) BY MOUTH DAILY 90 capsule 1   losartan  (COZAAR ) 50 MG tablet TAKE 1 TABLET(50 MG) BY MOUTH DAILY 90 tablet 1   pantoprazole  (PROTONIX ) 40 MG tablet TAKE 1 TABLET(40 MG) BY MOUTH DAILY 90 tablet 0      No current facility-administered medications for this visit.      Imaging Results (Last 48 hours)  No results found.       Review of Systems:   A ROS was performed including pertinent positives and negatives as documented in the HPI.     Musculoskeletal Exam:     There were no vitals taken for this visit.   Left hip 30 degrees internal/external rotation without pain.  Incision is well-appearing without erythema or drainage.  Distal neurosensory exam is intact   Imaging:         I personally reviewed and interpreted the radiographs.     Assessment:   Status post left hip gluteus medius repair.  Unfortunately today's visit she is still having lateral based hip pain which has not progressed and she does still have a limp despite an additional 2 injections in the femoral acetabular joint and lateral trochanter.  MRI does confirm undersurface persistent tearing at the gluteus medius with gluteus minimus atrophy.  Given the fact that she is not recovering with extensive physical therapy as well as subsequent injections about the lateral hip I have advised that I would recommend additional intervention.  We did discuss the  possibility of gluteus maximus  tendon transfer.  I did discuss risks and limitations as well as associated recovery with this.  After discussion she would like to proceed Plan :     - Plan for left hip hip gluteus maximus tendon transfer     After a lengthy discussion of treatment options, including risks, benefits, alternatives, complications of surgical and nonsurgical conservative options, the patient elected surgical repair.    The patient  is aware of the material risks  and complications including, but not limited to injury to adjacent structures, neurovascular injury, infection, numbness, bleeding, implant failure, thermal burns, stiffness, persistent pain, failure to heal, disease transmission from allograft, need for further surgery, dislocation, anesthetic risks, blood clots, risks of death,and others. The probabilities of surgical success and failure discussed with patient given their particular co-morbidities.The time and nature of expected rehabilitation and recovery was discussed.The patient's questions were all answered preoperatively.  No barriers to understanding were noted. I explained the natural history of the disease process and Rx rationale.  I explained to the patient what I considered to be reasonable expectations given their personal situation.  The final treatment plan was arrived at through a shared patient decision making process model.       I personally saw and evaluated the patient, and participated in the management and treatment plan.   Elspeth Parker, MD Attending Physician, Orthopedic Surgery   This document was dictated using Dragon voice recognition software. A reasonable attempt at proof reading has been made to minimize errors.

## 2024-01-04 NOTE — Brief Op Note (Signed)
   Brief Op Note  Date of Surgery: 01/04/2024  Preoperative Diagnosis: LEFT GLUTEUS MEDIUS TENDINOPATHY  Postoperative Diagnosis: same  Procedure: Procedure(s): REPAIR, TENDON, GLUTEUS MINIMUS  Implants: Implant Name Type Inv. Item Serial No. Manufacturer Lot No. LRB No. Used Action  ANCHOR JUGGERKNOT SOFT 2.9 - [id number] Anchor ANCHOR JUGGERKNOT SOFT 2.9  [employer](ORTH,TRAU,BIO,SG) [id number] Left 2 Implanted  ANCHOR SUT QUATTRO KNTLS 4.5 - [id number] Anchor ANCHOR SUT QUATTRO KNTLS 4.5  [employer](ORTH,TRAU,BIO,SG) [id number] Left 1 Implanted  ANCHOR SUT QUATTRO KNTLS 4.5 - [id number] Anchor ANCHOR SUT QUATTRO KNTLS 4.5  [employer]ON(ORTH,TRAU,BIO,SG) [id number] Left 1 Implanted    Surgeons: Surgeon(s): Genelle Standing, MD  Anesthesia: General    Estimated Blood Loss: See anesthesia record  Complications: None  Condition to PACU: Stable  Standing LITTIE Genelle, MD 01/04/2024 11:02 AM

## 2024-01-04 NOTE — Anesthesia Postprocedure Evaluation (Signed)
 Anesthesia Post Note  Patient: Ellen [name]  Procedure(s) Performed: REPAIR, TENDON, GLUTEUS MINIMUS (Left)     Patient location during evaluation: PACU Anesthesia Type: General Level of consciousness: sedated and patient cooperative Pain management: pain level controlled Vital Signs Assessment: post-procedure vital signs reviewed and stable Respiratory status: spontaneous breathing Cardiovascular status: stable Anesthetic complications: no   No notable events documented.  Last Vitals:  Vitals:   01/04/24 1200 01/04/24 1218  BP: (!) 147/67 (!) 155/79  Pulse: 77 73  Resp: 15 16  Temp:  (!) 36.3 C  SpO2: 93% 95%    Last Pain:  Vitals:   01/04/24 1218  PainSc: 8                  Daenerys Buttram [employer]

## 2024-01-04 NOTE — Op Note (Signed)
 Date of Surgery: 01/04/2024  INDICATIONS: Ellen [name] is a 83 y.o.-year-old female with left hip gluteus medius tear.  The risk and benefits of the procedure were discussed in detail and documented in the pre-operative evaluation.   PREOPERATIVE DIAGNOSIS: 1. Left hip failed gluteus medius repair  POSTOPERATIVE DIAGNOSIS: Same.  PROCEDURE: 1. Left hip gluteus maximus tendon transfer 2. Left hip trochanteric bursectomy  SURGEON: Elspeth LITTIE Parker MD  ASSISTANT: Conley Dawson, ATC  ANESTHESIA:  general  IV FLUIDS AND URINE: See anesthesia record.  ANTIBIOTICS: Ancef   ESTIMATED BLOOD LOSS: 10 mL.  IMPLANTS:  Implant Name Type Inv. Item Serial No. Manufacturer Lot No. LRB No. Used Action  ANCHOR JUGGERKNOT SOFT 2.9 - [id number] Anchor ANCHOR JUGGERKNOT SOFT 2.9  [employer](ORTH,TRAU,BIO,SG) [id number] Left 2 Implanted  ANCHOR SUT QUATTRO KNTLS 4.5 - [id number] Anchor ANCHOR SUT QUATTRO KNTLS 4.5  [employer](ORTH,TRAU,BIO,SG) [id number] Left 1 Implanted  ANCHOR SUT QUATTRO KNTLS 4.5 - [id number] Anchor ANCHOR SUT QUATTRO KNTLS 4.5  [employer](ORTH,TRAU,BIO,SG) [id number] Left 1 Implanted    DRAINS: None  CULTURES: None  COMPLICATIONS: none  DESCRIPTION OF PROCEDURE:   The patient was identified in the preoperative holding area.  Antibiotics were given 1 hour prior to skin incision.  Timeout was performed and surgical site was marked with nursing.  The patient was subsequently taken back to the operating room.  Anesthesia was induced. The patient was placed in the lateral decubitus position.  Axillary roll and down peroneal nerve was padded well   At this time I began with an approach to the lateral trochanter centered at the vastus ridge.  15 blade was used to incise skin.  This was done down to the level of the IT band.  Electrocautery was utilized to achieve hemostasis.  At this time the IT band was marked in such a way as to preserve an anterior flap.  This was incised  completely with a of an abduction.  Gluteus medius was completely torn and found to be nonviable with significant fatty infiltration.  At this time a flap was mobilized anteriorly from the gluteus maximus in a triangular fashion.  This mobilized well over to the trochanter. The trochanteric bursectomy was seen and excises with Metzenbaums.   A double row construct was utilized with 2 medial row all suture anchors.  These were brought up through the remaining gluteus medius tendon and into the flap.  2 of these were tied to provisionally stabilize the flap.  These were then brought up to the distal most aspect of the flap and brought into 2 lateral row anchors over the native footprint of the gluteus medius.     This time the wound was thoroughly irrigated.  The posterior aspect of the IT band was sutured to the posterior limb of the flap.  This was done with 0 Vicryl.  This was done in an interrupted fashion.  The wound was again irrigated and closed in layers of 0 Vicryl, 2-0 Vicryl and 3-0 nylon.  Xeroform, gauze, Aquacel dressing were applied.   Instrument, sponge, and needle counts were correct prior to wound closure and at the conclusion of the case.  The patient was taken to the PACU without complication         POSTOPERATIVE PLAN: The patient will be weight bearing as tolerated.  She will be placed on Aspirin  for blood clot prevention.  I will see the patient back in 2 weeks.    Elspeth LITTIE Parker, MD 11:02 AM

## 2024-01-04 NOTE — Transfer of Care (Signed)
 Immediate Anesthesia Transfer of Care Note  Patient: Ellen [name]  Procedure(s) Performed: REPAIR, TENDON, GLUTEUS MINIMUS (Left)  Patient Location: PACU  Anesthesia Type:General  Level of Consciousness: awake and patient cooperative  Airway & Oxygen Therapy: Patient Spontanous Breathing and Patient connected to nasal cannula oxygen  Post-op Assessment: Report given to RN and Post -op Vital signs reviewed and stable  Post vital signs: Reviewed and stable  Last Vitals:  Vitals Value Taken Time  BP 185/91 01/04/24 11:26  Temp    Pulse 81 01/04/24 11:26  Resp 13 01/04/24 11:26  SpO2 100 % 01/04/24 11:26  Vitals shown include unfiled device data.  Last Pain:  Vitals:   01/04/24 0909  PainSc: 0-No pain      Patients Stated Pain Goal: 4 (01/04/24 0909)  Complications: No notable events documented.

## 2024-01-05 ENCOUNTER — Encounter (HOSPITAL_BASED_OUTPATIENT_CLINIC_OR_DEPARTMENT_OTHER): Payer: Self-pay | Admitting: Orthopaedic Surgery

## 2024-01-06 ENCOUNTER — Ambulatory Visit (HOSPITAL_BASED_OUTPATIENT_CLINIC_OR_DEPARTMENT_OTHER): Admitting: Physical Therapy

## 2024-01-08 ENCOUNTER — Encounter (HOSPITAL_BASED_OUTPATIENT_CLINIC_OR_DEPARTMENT_OTHER): Payer: Self-pay | Admitting: Physical Therapy

## 2024-01-08 ENCOUNTER — Other Ambulatory Visit: Payer: Self-pay

## 2024-01-08 ENCOUNTER — Ambulatory Visit (HOSPITAL_BASED_OUTPATIENT_CLINIC_OR_DEPARTMENT_OTHER): Attending: Orthopaedic Surgery | Admitting: Physical Therapy

## 2024-01-08 DIAGNOSIS — R293 Abnormal posture: Secondary | ICD-10-CM | POA: Insufficient documentation

## 2024-01-08 DIAGNOSIS — R262 Difficulty in walking, not elsewhere classified: Secondary | ICD-10-CM | POA: Insufficient documentation

## 2024-01-08 DIAGNOSIS — M25552 Pain in left hip: Secondary | ICD-10-CM | POA: Diagnosis not present

## 2024-01-08 DIAGNOSIS — M6281 Muscle weakness (generalized): Secondary | ICD-10-CM | POA: Diagnosis not present

## 2024-01-08 DIAGNOSIS — M67959 Unspecified disorder of synovium and tendon, unspecified thigh: Secondary | ICD-10-CM | POA: Insufficient documentation

## 2024-01-08 NOTE — Therapy (Signed)
 OUTPATIENT PHYSICAL THERAPY LOWER EXTREMITY EVALUATION   Patient Name: Ellen [name] MRN: [id number] DOB:03/22/1941, 83 y.o., female Today's Date: 01/08/2024  END OF SESSION:  PT End of Session - 01/08/24 0923     Visit Number 1    Number of Visits 24    Date for PT Re-Evaluation 03/24/24    Authorization Type MCR A & B    Progress Note Due on Visit 10    PT Start Time 0750    PT Stop Time 0825    PT Time Calculation (min) 35 min    Activity Tolerance Patient tolerated treatment well    Behavior During Therapy [facility] for tasks assessed/performed          Past Medical History:  Diagnosis Date   Allergy    Anxiety    Arthritis    Bursitis    left thigh   GERD (gastroesophageal reflux disease)    History of kidney stones    Hypertension    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   Left Buccal Cheek (Keratoacanthoma)   Umbilical hernia    Past Surgical History:  Procedure Laterality Date   BOWEL RESECTION  07/09/2013   Procedure: SMALL BOWEL RESECTION;  Surgeon: Krystal CHRISTELLA Spinner, MD;  Location: [facility];  Service: General;;   RODDIE NEVIN REPAIR Left 03/22/2023   Procedure: LEFT GLUTEUS MEDIUS REPAIR WITH POSSIBLE COLLAGEN PATCH AUGMENTATION;  Surgeon: Genelle Standing, MD;  Location: [facility];  Service: Orthopedics;  Laterality: Left;   GLUTEUS MINIMUS REPAIR Left 01/04/2024   Procedure: REPAIR, TENDON, GLUTEUS MINIMUS;  Surgeon: Genelle Standing, MD;  Location: [facility];  Service: Orthopedics;  Laterality: Left;  LEFT HIP GLUTEUS MAXIMUS TENDON TRANSFER WITH COLLAGEN PATCH AUGMENTATION   LAPAROTOMY N/A 07/09/2013   Procedure: EXPLORATORY LAPAROTOMY;  Surgeon: Krystal CHRISTELLA Spinner, MD;  Location: [facility];  Service: General;  Laterality: N/A;   SHOULDER ARTHROSCOPY WITH CAPSULORRHAPHY     Patient Active Problem List   Diagnosis Date Noted   B12 deficiency 07/30/2023   Skin ulcer of ankle, limited to breakdown of skin (HCC) 04/28/2023    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   Hypokalemia 12/27/2022   Volume depletion 12/24/2022   Constipation 12/24/2022   Partial small bowel obstruction (HCC) 12/24/2022   Polycythemia 12/24/2022   Gastroesophageal reflux disease 08/26/2022   Bloating 08/26/2022   Small intestinal bacterial overgrowth (SIBO) 08/26/2022   Osteoarthritis 12/01/2021   Plantar wart 12/01/2021   Ventral hernia 01/01/2021   Diverticulosis 01/05/2019   Dermatitis 01/05/2019   Systolic murmur 07/25/2018   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   Hyperglycemia 10/05/2017   Leg edema 10/05/2017   Hammer toe 10/05/2017   Hot flashes due to menopause 10/05/2017   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/10/2013 07/11/2013   Hypertension 07/09/2013    PCP: Kennyth Worth CHRISTELLA, MD  REFERRING PROVIDER: Genelle Standing, MD  REFERRING DIAG: 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 She denies any known MOI, but reports onset about a year ago. She had the same surgery Nov  2024 with no resolution. I am not sleeping well. I am able to walk backwards and sideways with minimal pain, but any forward motion is a 10/10 pain. I go down the stairs backwards. 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 I hate that I walk in a crouched position with a lean. I want to walk straight, that is my ultimate goal. 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    PERTINENT HISTORY: Glute min repair, Laparotomy, Shoulder arthroscopy with capsulorrhaphy.  PAIN:  Are  you having pain? Yes: NPRS scale: 6-7/10 Sitting. 8-9/10 standing. 10/10, walking.   Pain location: L glute min Pain description: Constant pain Aggravating factors: Walking, standing, sitting, any hip flexion  Relieving factors: Ice.   PRECAUTIONS: Other: gute min, fall risk.  RED FLAGS: None   WEIGHT BEARING RESTRICTIONS: Yes PWB  FALLS:  Has patient fallen in last 6 months? No  LIVING ENVIRONMENT: Lives with: lives with their spouse Lives in: House/apartment Stairs: Yes: Internal: 12 steps; on right going up and External: 4 steps; bilateral but cannot reach both Has following equipment at home: Single point cane, Walker - 2 wheeled, Environmental consultant - 4 wheeled, and Grab bars  OCCUPATION: Human resources officer.   PLOF: Independent  PATIENT GOALS: Pt would like to walk straight.   NEXT MD VISIT: 01/19/2024  OBJECTIVE:  Note: Objective measures were completed at Evaluation unless otherwise noted.  DIAGNOSTIC FINDINGS: IMPRESSION: 1. Progressive fluid signal tracking between the gluteus minimus and within and along the gluteus medius muscle extending down to moderate gluteus medius tendinopathy and peritendinitis. Mild to moderate left gluteus minimus tendinopathy distally. 2. Trace left trochanteric bursitis. 3. Moderate degenerative hip arthropathy bilaterally. 4. Proximal hamstring tendinopathy and likely partial tearing similar to prior. 5. Substantial loss of intervertebral disc height at L4-5 and L5-S1 with type 2 degenerative endplate findings eccentric to the right at L4-5. 6. Intramural uterine fibroids. 7. Sigmoid colon diverticulosis.  PATIENT SURVEYS:  25/80  COGNITION: Overall cognitive status: Within functional limits for tasks assessed     SENSATION: WFL  EDEMA:  Localized swelling from surgery.   POSTURE: No Significant postural limitations  PALPATION: Tenderness to palpation at surgical site.   LOWER EXTREMITY ROM:  Passive ROM Right eval  Left eval  Hip flexion    Hip extension    Hip abduction    Hip adduction    Hip internal rotation    Hip external rotation    Knee flexion    Knee extension    Ankle dorsiflexion    Ankle plantarflexion    Ankle inversion    Ankle eversion     (Blank rows = not tested)  LOWER EXTREMITY MMT:  MMT Right eval Left eval  Hip flexion    Hip extension    Hip abduction    Hip adduction    Hip internal rotation    Hip external rotation    Knee flexion    Knee extension    Ankle dorsiflexion    Ankle plantarflexion    Ankle inversion    Ankle eversion     (Blank rows = not tested)   GAIT: Distance walked: 37ft  Assistive device utilized: Walker - 2 wheeled Level of assistance: Complete Independence Comments: Slow, antalgic gait with PWB. (30% WB per pt).  TREATMENT DATE: Creating, reviewing, and completing below HEP   PATIENT EDUCATION:  Education details: Educated pt on anatomy and physiology of current symptoms, LEFS, diagnosis, prognosis, HEP,  and POC. Person educated: Patient Education method: Medical illustrator Education comprehension: verbalized understanding, returned demonstration, verbal cues required, and tactile cues required  HOME EXERCISE PROGRAM: Access Code: [email or web address] URL: [email or web address] Date: 01/08/2024 Prepared by: Rojean Batten  Exercises - Supine Posterior Pelvic Tilt  - 2 x daily - 7 x weekly - 1-2 sets - 10 reps - 5 hold - Seated Hip Adduction Isometrics with Ball  - 2 x daily - 7 x weekly - 1-2 sets - 10 reps - 5 hold - Supine Short Arc Quad  - 2 x daily - 7 x weekly - 1-2 sets - 10 reps - 5 hold   ASSESSMENT:  CLINICAL IMPRESSION: Patient is an 83 y.o. female 5 days s/p Left hip gluteus med repair. She is ambulating with a 2WW and a lateral lean to L side and crouched gait.  She is  limited with ambulation distance and has difficulty with stairs. She is currently going downstairs backwards due to pain. 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 Pt has gait deficits and L LE muscle weakness.  Pt should benefit from skilled PT services per protocol to address impairments and to improve overall function.         OBJECTIVE IMPAIRMENTS: decreased activity tolerance, difficulty walking, decreased balance, decreased endurance, decreased mobility, decreased ROM, decreased strength, impaired flexibility, impaired UE/LE use, postural dysfunction, and pain.  ACTIVITY LIMITATIONS: bending, lifting, carry, locomotion, cleaning, community activity, driving, and or occupation  PERSONAL FACTORS: Age, second surgical procedure to same location, past medical history are also affecting patient's functional outcome.  REHAB POTENTIAL: Good  CLINICAL DECISION MAKING: Stable/uncomplicated  EVALUATION COMPLEXITY: Low    SHORT TERM GOALS: 01/28/24  Pt will be independent and compliant with HEP for improved pain, strength, and function. Baseline: Goal status: INITIAL Target date:      2.  Pt will demo improved quality of gait with increased stance time and toe off on L.   Baseline:  Goal status: INITIAL Target date:     3.  Pt will progress with exercises per protocol without adverse effects for improved strength and mobility.   Baseline:  Goal status: INITIAL Target date:       LONG TERM GOALS: Target date: 03/24/24     Pt will be able to ambulate extended community distance without significant difficulty and pain.   Baseline:  Goal status: INITIAL   2.  Pt will be able to perform her ADLs and IADLs including household chores without significant difficulty and pain. Baseline:  Goal status: INITIAL   3.  Pt will be able to take care of her husband without  significant limitations.   Baseline:  Goal status: INITIAL   4.  Pt will be able to perform mini squats with good form and without increased pain in order for improved functional LE strength and to assist with returning to gardening activities.   Baseline:  Goal status: INITIAL  Pt will report improved tolerance with standing activities and ambulation.  Baseline:  Goal status: INITIAL Target date:       5.  Pt will ambulate with a normalized heel to toe gait without limping.  Baseline:  Goal status: INITIAL  Target date:      6.  Pt will be able to perform a 6 inch step up with good form and control with L LE leading for improved performance of stairs abd and improved functional strength. Baseline:  Goal status: INITIAL Target date:       PLAN:   PT FREQUENCY: 1x/week x 2-3 weeks and 2x/wk afterwards   PT DURATION: 12-14 weeks  PLANNED INTERVENTIONS (unless contraindicated): aquatic PT, Canalith repositioning, cryotherapy, Electrical stimulation, Iontophoresis with 4 mg/ml dexamethasome, Moist heat, traction, Ultrasound, gait training, Therapeutic exercise, balance training, neuromuscular re-education, patient/family education, prosthetic training, manual techniques, passive ROM, dry needling, taping, vasopnuematic device, vestibular, spinal manipulations, joint manipulations  PLAN FOR NEXT SESSION: Glute min repair protocol.     Rojean JONELLE Batten, PT 01/08/2024, 9:25 AM

## 2024-01-10 ENCOUNTER — Telehealth: Payer: Self-pay | Admitting: Family Medicine

## 2024-01-10 MED ORDER — HYDROCHLOROTHIAZIDE 12.5 MG PO CAPS: ORAL_CAPSULE | ORAL | 0 refills | Status: DC

## 2024-01-10 MED ORDER — FUROSEMIDE 40 MG PO TABS: 40.0000 mg | ORAL_TABLET | Freq: Every day | ORAL | 0 refills | Status: AC

## 2024-01-10 MED ORDER — LOSARTAN POTASSIUM 50 MG PO TABS: ORAL_TABLET | ORAL | 0 refills | Status: DC

## 2024-01-10 NOTE — Telephone Encounter (Signed)
 Rx done.

## 2024-01-10 NOTE — Telephone Encounter (Signed)
 Prescription Request  01/10/2024  LOV: 11/04/2023  What is the name of the medication or equipment? HCTZ 12.5MG , LOSARTAN  50 MG, AND LASIX  40 MG (PT REQUESTING THIS BE DONE ASAP DUE TO BEING OUT FOR A WEEK. STATES PHARMACY INFORMED HER THEY SENT A REFILL REQUEST BUT WE HAVE NOT GOTTEN ANYTHING)   Have you contacted your pharmacy to request a refill? Yes   Which pharmacy would you like this sent to?  [id number] [place], [place] - [address] AT [facility] [address] [facility] [address] & [facility] [address] [place] [address] Phone: [phone] Fax: [phone]    Patient notified that their request is being sent to the clinical staff for review and that they should receive a response within 2 business days.   Please advise at Mobile [phone] (mobile)

## 2024-01-14 ENCOUNTER — Encounter (HOSPITAL_BASED_OUTPATIENT_CLINIC_OR_DEPARTMENT_OTHER): Payer: Self-pay

## 2024-01-14 ENCOUNTER — Ambulatory Visit (HOSPITAL_BASED_OUTPATIENT_CLINIC_OR_DEPARTMENT_OTHER)

## 2024-01-14 DIAGNOSIS — M6281 Muscle weakness (generalized): Secondary | ICD-10-CM

## 2024-01-14 DIAGNOSIS — R262 Difficulty in walking, not elsewhere classified: Secondary | ICD-10-CM | POA: Diagnosis not present

## 2024-01-14 DIAGNOSIS — M67959 Unspecified disorder of synovium and tendon, unspecified thigh: Secondary | ICD-10-CM | POA: Diagnosis not present

## 2024-01-14 DIAGNOSIS — M25552 Pain in left hip: Secondary | ICD-10-CM | POA: Diagnosis not present

## 2024-01-14 DIAGNOSIS — R293 Abnormal posture: Secondary | ICD-10-CM | POA: Diagnosis not present

## 2024-01-14 NOTE — Therapy (Signed)
 OUTPATIENT PHYSICAL THERAPY LOWER EXTREMITY TREATMENT   Patient Name: Ellen [name] MRN: [id number] DOB:07/12/1940, 83 y.o., female Today's Date: 01/14/2024  END OF SESSION:  PT End of Session - 01/14/24 1349     Visit Number 2    Number of Visits 24    Date for PT Re-Evaluation 03/24/24    Authorization Type MCR A & B    Progress Note Due on Visit 10    PT Start Time 1347    PT Stop Time 1429    PT Time Calculation (min) 42 min    Activity Tolerance Patient tolerated treatment well    Behavior During Therapy WFL for tasks assessed/performed           Past Medical History:  Diagnosis Date   Allergy    Anxiety    Arthritis    Bursitis    left thigh   GERD (gastroesophageal reflux disease)    History of kidney stones    Hypertension    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   Left Buccal Cheek (Keratoacanthoma)   Umbilical hernia    Past Surgical History:  Procedure Laterality Date   BOWEL RESECTION  07/09/2013   Procedure: SMALL BOWEL RESECTION;  Surgeon: Krystal CHRISTELLA Spinner, MD;  Location: [facility];  Service: General;;   RODDIE NEVIN REPAIR Left 03/22/2023   Procedure: LEFT GLUTEUS MEDIUS REPAIR WITH POSSIBLE COLLAGEN PATCH AUGMENTATION;  Surgeon: Genelle Standing, MD;  Location: [facility];  Service: Orthopedics;  Laterality: Left;   GLUTEUS MINIMUS REPAIR Left 01/04/2024   Procedure: REPAIR, TENDON, GLUTEUS MINIMUS;  Surgeon: Genelle Standing, MD;  Location: [facility];  Service: Orthopedics;  Laterality: Left;  LEFT HIP GLUTEUS MAXIMUS TENDON TRANSFER WITH COLLAGEN PATCH AUGMENTATION   LAPAROTOMY N/A 07/09/2013   Procedure: EXPLORATORY LAPAROTOMY;  Surgeon: Krystal CHRISTELLA Spinner, MD;  Location: [facility];  Service: General;  Laterality: N/A;   SHOULDER ARTHROSCOPY WITH CAPSULORRHAPHY     Patient Active Problem List   Diagnosis Date Noted   B12 deficiency 07/30/2023   Skin ulcer of ankle, limited to breakdown of skin (HCC) 04/28/2023    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   Hypokalemia 12/27/2022   Volume depletion 12/24/2022   Constipation 12/24/2022   Partial small bowel obstruction (HCC) 12/24/2022   Polycythemia 12/24/2022   Gastroesophageal reflux disease 08/26/2022   Bloating 08/26/2022   Small intestinal bacterial overgrowth (SIBO) 08/26/2022   Osteoarthritis 12/01/2021   Plantar wart 12/01/2021   Ventral hernia 01/01/2021   Diverticulosis 01/05/2019   Dermatitis 01/05/2019   Systolic murmur 07/25/2018   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   Hyperglycemia 10/05/2017   Leg edema 10/05/2017   Hammer toe 10/05/2017   Hot flashes due to menopause 10/05/2017   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/10/2013 07/11/2013   Hypertension 07/09/2013    PCP: Kennyth Worth CHRISTELLA, MD  REFERRING PROVIDER: Genelle Standing, MD  REFERRING DIAG: 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 Mostly dull but can be sharp based on movement. It's worse than the last surgery. Pt reports difficulty sleeping. Pt taking tylenol  and aleve to manage pain.   Eval: Pt is a part time professional gardener. She denies any known MOI, but reports onset  about a year ago. She had the same surgery Nov 2024 with no resolution. I am not sleeping well. I am able to walk backwards and sideways with minimal pain, but any forward motion is a 10/10 pain. I go down the stairs backwards. 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 I hate that I walk in a crouched position with a lean. I want to walk straight, that is my ultimate goal. 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    PERTINENT HISTORY: Glute min repair,  Laparotomy, Shoulder arthroscopy with capsulorrhaphy.  PAIN:  Are you having pain? Yes: NPRS scale: 6/10 Sitting. 6/10 standing. 6/10, walking.   Pain location: L glute min Pain description: Constant pain Aggravating factors: Walking, standing, sitting, any hip flexion  Relieving factors: Ice.   PRECAUTIONS: Other: gute min, fall risk.  RED FLAGS: None   WEIGHT BEARING RESTRICTIONS: Yes PWB  FALLS:  Has patient fallen in last 6 months? No  LIVING ENVIRONMENT: Lives with: lives with their spouse Lives in: House/apartment Stairs: Yes: Internal: 12 steps; on right going up and External: 4 steps; bilateral but cannot reach both Has following equipment at home: Single point cane, Walker - 2 wheeled, Environmental consultant - 4 wheeled, and Grab bars  OCCUPATION: Human resources officer.   PLOF: Independent  PATIENT GOALS: Pt would like to walk straight.   NEXT MD VISIT: 01/19/2024  OBJECTIVE:  Note: Objective measures were completed at Evaluation unless otherwise noted.  DIAGNOSTIC FINDINGS: IMPRESSION: 1. Progressive fluid signal tracking between the gluteus minimus and within and along the gluteus medius muscle extending down to moderate gluteus medius tendinopathy and peritendinitis. Mild to moderate left gluteus minimus tendinopathy distally. 2. Trace left trochanteric bursitis. 3. Moderate degenerative hip arthropathy bilaterally. 4. Proximal hamstring tendinopathy and likely partial tearing similar to prior. 5. Substantial loss of intervertebral disc height at L4-5 and L5-S1 with type 2 degenerative endplate findings eccentric to the right at L4-5. 6. Intramural uterine fibroids. 7. Sigmoid colon diverticulosis.  PATIENT SURVEYS:  25/80  COGNITION: Overall cognitive status: Within functional limits for tasks assessed     SENSATION: WFL  EDEMA:  Localized swelling from surgery.   POSTURE: No Significant postural limitations  PALPATION: Tenderness to palpation at surgical  site.   LOWER EXTREMITY ROM:  Passive ROM Right eval Left eval  Hip flexion    Hip extension    Hip abduction    Hip adduction    Hip internal rotation    Hip external rotation    Knee flexion    Knee extension    Ankle dorsiflexion    Ankle plantarflexion    Ankle inversion    Ankle eversion     (Blank rows = not tested)  LOWER EXTREMITY MMT:  MMT Right eval Left eval  Hip flexion    Hip extension    Hip abduction    Hip adduction    Hip internal rotation    Hip external rotation    Knee flexion    Knee extension    Ankle dorsiflexion    Ankle plantarflexion    Ankle inversion    Ankle eversion     (Blank rows = not tested)   GAIT: Distance walked: 37ft  Assistive device utilized: Walker - 2 wheeled Level of assistance: Complete Independence Comments: Slow, antalgic gait with PWB. (30% WB per pt).  TREATMENT DATE:   01/14/24 -PROM R hip within protocol limits -glute sets 5 2x10 -Hooklying adductor squeze 5 2x10 -SAQ 5 2x10 -LAQ 5 2x10      PATIENT EDUCATION:  Education details: Educated pt on anatomy and physiology of current symptoms, LEFS, diagnosis, prognosis, HEP,  and POC. Person educated: Patient Education method: Medical illustrator Education comprehension: verbalized understanding, returned demonstration, verbal cues required, and tactile cues required  HOME EXERCISE PROGRAM: Access Code: [email or web address] URL: [email or web address] Date: 01/08/2024 Prepared by: Rojean Batten  Exercises - Supine Posterior Pelvic Tilt  - 2 x daily - 7 x weekly - 1-2 sets - 10 reps - 5 hold - Seated Hip Adduction Isometrics with Ball  - 2 x daily - 7 x weekly - 1-2 sets - 10 reps - 5 hold - Supine Short Arc Quad  - 2 x daily - 7 x weekly - 1-2 sets - 10 reps - 5 hold   ASSESSMENT:  CLINICAL  IMPRESSION:  Good ROM available within protocol limits with minimal guarding present. Reviewed protocol precautions and restrictions at this time. Good tolerance for gentle plinth based therex. Discussed activity modification as pt is caregiver for husband. Instructed in ice use for pain relief. Will monitor pain level and progress as tolerated.   Eval:  Patient is an 83 y.o. female 5 days s/p Left hip gluteus med repair. She is ambulating with a 2WW and a lateral lean to L side and crouched gait.  She is limited with ambulation distance and has difficulty with stairs. She is currently going downstairs backwards due to pain. 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 Pt has gait deficits and L LE muscle weakness.  Pt should benefit from skilled PT services per protocol to address impairments and to improve overall function.         OBJECTIVE IMPAIRMENTS: decreased activity tolerance, difficulty walking, decreased balance, decreased endurance, decreased mobility, decreased ROM, decreased strength, impaired flexibility, impaired UE/LE use, postural dysfunction, and pain.  ACTIVITY LIMITATIONS: bending, lifting, carry, locomotion, cleaning, community activity, driving, and or occupation  PERSONAL FACTORS: Age, second surgical procedure to same location, past medical history are also affecting patient's functional outcome.  REHAB POTENTIAL: Good  CLINICAL DECISION MAKING: Stable/uncomplicated  EVALUATION COMPLEXITY: Low    SHORT TERM GOALS: 01/28/24  Pt will be independent and compliant with HEP for improved pain, strength, and function. Baseline: Goal status: INITIAL Target date:      2.  Pt will demo improved quality of gait with increased stance time and toe off on L.   Baseline:  Goal status: INITIAL Target date:     3.  Pt will progress with exercises per protocol  without adverse effects for improved strength and mobility.   Baseline:  Goal status: INITIAL Target date:       LONG TERM GOALS: Target date: 03/24/24     Pt will be able to ambulate extended community distance without significant difficulty and pain.   Baseline:  Goal status: INITIAL   2.  Pt will be able to perform her ADLs and IADLs including household chores without significant difficulty and pain. Baseline:  Goal status: INITIAL   3.  Pt will be able to take care of her husband without significant limitations.   Baseline:  Goal status: INITIAL   4.  Pt will be able to perform mini  squats with good form and without increased pain in order for improved functional LE strength and to assist with returning to gardening activities.   Baseline:  Goal status: INITIAL  Pt will report improved tolerance with standing activities and ambulation.  Baseline:  Goal status: INITIAL Target date:       5.  Pt will ambulate with a normalized heel to toe gait without limping.  Baseline:  Goal status: INITIAL Target date:      6.  Pt will be able to perform a 6 inch step up with good form and control with L LE leading for improved performance of stairs abd and improved functional strength. Baseline:  Goal status: INITIAL Target date:       PLAN:   PT FREQUENCY: 1x/week x 2-3 weeks and 2x/wk afterwards   PT DURATION: 12-14 weeks  PLANNED INTERVENTIONS (unless contraindicated): aquatic PT, Canalith repositioning, cryotherapy, Electrical stimulation, Iontophoresis with 4 mg/ml dexamethasome, Moist heat, traction, Ultrasound, gait training, Therapeutic exercise, balance training, neuromuscular re-education, patient/family education, prosthetic training, manual techniques, passive ROM, dry needling, taping, vasopnuematic device, vestibular, spinal manipulations, joint manipulations  PLAN FOR NEXT SESSION: Glute min repair protocol.     Asberry BRAVO Tauheed Mcfayden, PTA 01/14/2024, 4:58 PM

## 2024-01-19 ENCOUNTER — Ambulatory Visit (HOSPITAL_BASED_OUTPATIENT_CLINIC_OR_DEPARTMENT_OTHER): Admitting: Orthopaedic Surgery

## 2024-01-19 ENCOUNTER — Other Ambulatory Visit (HOSPITAL_BASED_OUTPATIENT_CLINIC_OR_DEPARTMENT_OTHER): Payer: Self-pay

## 2024-01-19 ENCOUNTER — Ambulatory Visit (HOSPITAL_BASED_OUTPATIENT_CLINIC_OR_DEPARTMENT_OTHER): Attending: Orthopaedic Surgery

## 2024-01-19 ENCOUNTER — Encounter (HOSPITAL_BASED_OUTPATIENT_CLINIC_OR_DEPARTMENT_OTHER): Payer: Self-pay

## 2024-01-19 DIAGNOSIS — R262 Difficulty in walking, not elsewhere classified: Secondary | ICD-10-CM | POA: Insufficient documentation

## 2024-01-19 DIAGNOSIS — M6281 Muscle weakness (generalized): Secondary | ICD-10-CM | POA: Insufficient documentation

## 2024-01-19 DIAGNOSIS — M67959 Unspecified disorder of synovium and tendon, unspecified thigh: Secondary | ICD-10-CM

## 2024-01-19 DIAGNOSIS — M25552 Pain in left hip: Secondary | ICD-10-CM | POA: Insufficient documentation

## 2024-01-19 DIAGNOSIS — R293 Abnormal posture: Secondary | ICD-10-CM | POA: Diagnosis not present

## 2024-01-19 MED ORDER — MELOXICAM 15 MG PO TABS
15.0000 mg | ORAL_TABLET | Freq: Every day | ORAL | 0 refills | Status: DC
  Filled 2024-01-19: qty 14, 14d supply, fill #0

## 2024-01-19 NOTE — Progress Notes (Signed)
 Post Operative Evaluation    Procedure/Date of Surgery: Left hip gluteus maximus tendon transfer 8/19  Interval History:   Presents 2 weeks status post the above procedure.  Overall she is doing extremely well.  She is having some soreness which radiates down to the knee.  She is using walker for ambulation.  She has been back to her garden   PMH/PSH/Family History/Social History/Meds/Allergies:    Past Medical History:  Diagnosis Date   Allergy    Anxiety    Arthritis    Bursitis    left thigh   GERD (gastroesophageal reflux disease)    History of kidney stones    Hypertension    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   Left Buccal Cheek (Keratoacanthoma)   Umbilical hernia    Past Surgical History:  Procedure Laterality Date   BOWEL RESECTION  07/09/2013   Procedure: SMALL BOWEL RESECTION;  Surgeon: Krystal CHRISTELLA Spinner, MD;  Location: [facility];  Service: General;;   RODDIE NEVIN REPAIR Left 03/22/2023   Procedure: LEFT GLUTEUS MEDIUS REPAIR WITH POSSIBLE COLLAGEN PATCH AUGMENTATION;  Surgeon: Genelle Standing, MD;  Location: [facility];  Service: Orthopedics;  Laterality: Left;   GLUTEUS MINIMUS REPAIR Left 01/04/2024   Procedure: REPAIR, TENDON, GLUTEUS MINIMUS;  Surgeon: Genelle Standing, MD;  Location: [facility];  Service: Orthopedics;  Laterality: Left;  LEFT HIP GLUTEUS MAXIMUS TENDON TRANSFER WITH COLLAGEN PATCH AUGMENTATION   LAPAROTOMY N/A 07/09/2013   Procedure: EXPLORATORY LAPAROTOMY;  Surgeon: Krystal CHRISTELLA Spinner, MD;  Location: [facility];  Service: General;  Laterality: N/A;   SHOULDER ARTHROSCOPY WITH CAPSULORRHAPHY     Social History   Socioeconomic History   Marital status: Married    Spouse name: Not on file   Number of children: 0   Years of education: Not on file   Highest education level: Not on file  Occupational History   Occupation: Retired   Tobacco Use   Smoking status: Former     Types: Cigarettes   Smokeless tobacco: Never  Vaping Use   Vaping status: Never Used  Substance and Sexual Activity   Alcohol use: Yes    Alcohol/week: 7.0 standard drinks of alcohol    Types: 7 Glasses of wine per week    Comment: nightly glass of wine   Drug use: No   Sexual activity: Not Currently  Other Topics Concern   Not on file  Social History Narrative   Enjoys gardening    Social Drivers of Health   Financial Resource Strain: Low Risk  (07/22/2023)   Overall Financial Resource Strain (CARDIA)    Difficulty of Paying Living Expenses: Not hard at all  Food Insecurity: No Food Insecurity (07/22/2023)   Hunger Vital Sign    Worried About Running Out of Food in the Last Year: Never true    Ran Out of Food in the Last Year: Never true  Transportation Needs: No Transportation Needs (07/22/2023)   PRAPARE - Administrator, Civil Service (Medical): No    Lack of Transportation (Non-Medical): No  Physical Activity: Sufficiently Active (07/22/2023)   Exercise Vital Sign    Days of Exercise per Week: 5 days    Minutes of Exercise per Session: 120 min  Stress: No Stress Concern Present (07/22/2023)   [place]  [employer] - Occupational Stress Questionnaire    Feeling of Stress : Not at all  Social Connections: Moderately Integrated (07/22/2023)   Social Connection and Isolation Panel    Frequency of Communication with Friends and Family: Once a week    Frequency of Social Gatherings with Friends and Family: More than three times a week    Attends Religious Services: More than 4 times per year    Active Member of [employer] or Organizations: No    Attends Engineer, structural: Never    Marital Status: Married   Family History  Problem Relation Age of Onset   Diabetes Mother    Heart attack Mother    CAD Father    Heart attack Father    Breast cancer Neg Hx    Colon cancer Neg Hx    Esophageal cancer Neg Hx    Rectal cancer Neg Hx    Stomach  cancer Neg Hx    Allergies  Allergen Reactions   Lactose Intolerance (Gi) Diarrhea   Sulfa Antibiotics Nausea And Vomiting and Swelling   Current Outpatient Medications  Medication Sig Dispense Refill   meloxicam  (MOBIC ) 15 MG tablet Take 1 tablet (15 mg total) by mouth daily. 14 tablet 0   aspirin  EC 325 MG tablet Take 1 tablet (325 mg total) by mouth daily. 14 tablet 0   Calcium  Carbonate Antacid (TUMS PO) Take 1 tablet by mouth as needed (reflux).     Carboxymethylcellulose Sodium (EYE DROPS OP) Place 2 drops into both eyes daily. Thera Tears     furosemide  (LASIX ) 40 MG tablet Take 1 tablet (40 mg total) by mouth daily. 90 tablet 0   hydrochlorothiazide  (MICROZIDE ) 12.5 MG capsule TAKE 1 CAPSULE(12.5 MG) BY MOUTH DAILY 90 capsule 0   losartan  (COZAAR ) 50 MG tablet TAKE 1 TABLET(50 MG) BY MOUTH DAILY 90 tablet 0   pantoprazole  (PROTONIX ) 40 MG tablet TAKE 1 TABLET(40 MG) BY MOUTH DAILY 90 tablet 0   No current facility-administered medications for this visit.   No results found.  Review of Systems:   A ROS was performed including pertinent positives and negatives as documented in the HPI.   Musculoskeletal Exam:    There were no vitals taken for this visit.  Left hip incision is well-appearing without erythema or drainage.  There is no redness or erythema.  Walks with mildly antalgic gait distal exam is intact  Imaging:      I personally reviewed and interpreted the radiographs.   Assessment:   Status post left hip gluteus maximus tendon transfer overall doing well.  At this time I will plan to see her back in 4 weeks and she will continue work on strengthening range of motion  Plan :    - Return to clinic 4 weeks for reassessment      I personally saw and evaluated the patient, and participated in the management and treatment plan.  Elspeth Parker, MD Attending Physician, Orthopedic Surgery  This document was dictated using Dragon voice recognition software.  A reasonable attempt at proof reading has been made to minimize errors.

## 2024-01-19 NOTE — Therapy (Signed)
 OUTPATIENT PHYSICAL THERAPY LOWER EXTREMITY TREATMENT   Patient Name: Ellen [name] MRN: [id number] DOB:05-16-1941, 83 y.o., female Today's Date: 01/19/2024  END OF SESSION:  PT End of Session - 01/19/24 0853     Visit Number 3    Number of Visits 24    Date for PT Re-Evaluation 03/24/24    Authorization Type MCR A & B    Progress Note Due on Visit 10    PT Start Time 0849    PT Stop Time 0928    PT Time Calculation (min) 39 min    Activity Tolerance Patient tolerated treatment well    Behavior During Therapy [facility] for tasks assessed/performed            Past Medical History:  Diagnosis Date   Allergy    Anxiety    Arthritis    Bursitis    left thigh   GERD (gastroesophageal reflux disease)    History of kidney stones    Hypertension    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   Left Buccal Cheek (Keratoacanthoma)   Umbilical hernia    Past Surgical History:  Procedure Laterality Date   BOWEL RESECTION  07/09/2013   Procedure: SMALL BOWEL RESECTION;  Surgeon: Krystal CHRISTELLA Spinner, MD;  Location: [facility];  Service: General;;   RODDIE NEVIN REPAIR Left 03/22/2023   Procedure: LEFT GLUTEUS MEDIUS REPAIR WITH POSSIBLE COLLAGEN PATCH AUGMENTATION;  Surgeon: Genelle Standing, MD;  Location: [facility];  Service: Orthopedics;  Laterality: Left;   GLUTEUS MINIMUS REPAIR Left 01/04/2024   Procedure: REPAIR, TENDON, GLUTEUS MINIMUS;  Surgeon: Genelle Standing, MD;  Location: [facility];  Service: Orthopedics;  Laterality: Left;  LEFT HIP GLUTEUS MAXIMUS TENDON TRANSFER WITH COLLAGEN PATCH AUGMENTATION   LAPAROTOMY N/A 07/09/2013   Procedure: EXPLORATORY LAPAROTOMY;  Surgeon: Krystal CHRISTELLA Spinner, MD;  Location: [facility];  Service: General;  Laterality: N/A;   SHOULDER ARTHROSCOPY WITH CAPSULORRHAPHY     Patient Active Problem List   Diagnosis Date Noted   B12 deficiency 07/30/2023   Skin ulcer of ankle, limited to breakdown of skin (HCC)  04/28/2023   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   Hypokalemia 12/27/2022   Volume depletion 12/24/2022   Constipation 12/24/2022   Partial small bowel obstruction (HCC) 12/24/2022   Polycythemia 12/24/2022   Gastroesophageal reflux disease 08/26/2022   Bloating 08/26/2022   Small intestinal bacterial overgrowth (SIBO) 08/26/2022   Osteoarthritis 12/01/2021   Plantar wart 12/01/2021   Ventral hernia 01/01/2021   Diverticulosis 01/05/2019   Dermatitis 01/05/2019   Systolic murmur 07/25/2018   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   Hyperglycemia 10/05/2017   Leg edema 10/05/2017   Hammer toe 10/05/2017   Hot flashes due to menopause 10/05/2017   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/10/2013 07/11/2013   Hypertension 07/09/2013    PCP: [place] CHRISTELLA, MD  REFERRING PROVIDER: Genelle Standing, MD  REFERRING DIAG: Tendinopathy of gluteus medius [F32.040]   THERAPY DIAG:  Abnormal posture  Difficulty in walking, not elsewhere classified  Muscle weakness (generalized)  Pain in left hip  Rationale for Evaluation and Treatment: Rehabilitation  ONSET DATE: 01/04/2024  SUBJECTIVE:   SUBJECTIVE STATEMENT:  Pt reports 8/10 pain level, mostly in L knee.   Eval: Pt is a part time professional gardener. She denies any known MOI, but reports onset about a year ago. She had the same surgery Nov 2024 with no resolution. I am not sleeping well. I am able to  walk backwards and sideways with minimal pain, but any forward motion is a 10/10 pain. I go down the stairs backwards. 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 I hate that I walk in a crouched position with a lean. I want to walk straight, that is my ultimate goal. 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    PERTINENT HISTORY: Glute min repair, Laparotomy, Shoulder arthroscopy with capsulorrhaphy.  PAIN:  Are you having pain? Yes: NPRS scale: 6/10 Sitting. 6/10 standing. 6/10, walking.    Pain location: L glute min Pain description: Constant pain Aggravating factors: Walking, standing, sitting, any hip flexion  Relieving factors: Ice.   PRECAUTIONS: Other: gute min, fall risk.  RED FLAGS: None   WEIGHT BEARING RESTRICTIONS: Yes PWB  FALLS:  Has patient fallen in last 6 months? No  LIVING ENVIRONMENT: Lives with: lives with their spouse Lives in: House/apartment Stairs: Yes: Internal: 12 steps; on right going up and External: 4 steps; bilateral but cannot reach both Has following equipment at home: Single point cane, Walker - 2 wheeled, Environmental consultant - 4 wheeled, and Grab bars  OCCUPATION: Human resources officer.   PLOF: Independent  PATIENT GOALS: Pt would like to walk straight.   NEXT MD VISIT: 01/19/2024  OBJECTIVE:  Note: Objective measures were completed at Evaluation unless otherwise noted.  DIAGNOSTIC FINDINGS: IMPRESSION: 1. Progressive fluid signal tracking between the gluteus minimus and within and along the gluteus medius muscle extending down to moderate gluteus medius tendinopathy and peritendinitis. Mild to moderate left gluteus minimus tendinopathy distally. 2. Trace left trochanteric bursitis. 3. Moderate degenerative hip arthropathy bilaterally. 4. Proximal hamstring tendinopathy and likely partial tearing similar to prior. 5. Substantial loss of intervertebral disc height at L4-5 and L5-S1 with type 2 degenerative endplate findings eccentric to the right at L4-5. 6. Intramural uterine fibroids. 7. Sigmoid colon diverticulosis.  PATIENT SURVEYS:  25/80  COGNITION: Overall cognitive status: Within functional limits for tasks assessed     SENSATION: WFL  EDEMA:  Localized swelling from surgery.   POSTURE: No Significant postural limitations  PALPATION: Tenderness to palpation at surgical site.   LOWER EXTREMITY ROM:  Passive ROM Right eval Left eval  Hip flexion    Hip extension    Hip abduction    Hip adduction    Hip  internal rotation    Hip external rotation    Knee flexion    Knee extension    Ankle dorsiflexion    Ankle plantarflexion    Ankle inversion    Ankle eversion     (Blank rows = not tested)  LOWER EXTREMITY MMT:  MMT Right eval Left eval  Hip flexion    Hip extension    Hip abduction    Hip adduction    Hip internal rotation    Hip external rotation    Knee flexion    Knee extension    Ankle dorsiflexion    Ankle plantarflexion    Ankle inversion    Ankle eversion     (Blank rows = not tested)   GAIT: Distance walked: 11ft  Assistive device utilized: Walker - 2 wheeled Level of assistance: Complete Independence Comments: Slow, antalgic gait with PWB. (30% WB per pt).  TREATMENT DATE:   01/19/24 -PROM R hip within protocol limits -STM to lateral R quads -glute sets 5 2x10 -Hooklying adductor squeze 5 2x10 -LAQ 5 2x10  -standing HR 2x10 -gait with FWW x41ft, with SPSC x44ft.    01/14/24 -PROM R hip within protocol limits -glute sets 5 2x10 -Hooklying adductor squeze 5 2x10 -SAQ 5 2x10 -LAQ 5 2x10      PATIENT EDUCATION:  Education details: Educated pt on anatomy and physiology of current symptoms, LEFS, diagnosis, prognosis, HEP,  and POC. Person educated: Patient Education method: Medical illustrator Education comprehension: verbalized understanding, returned demonstration, verbal cues required, and tactile cues required  HOME EXERCISE PROGRAM: Access Code: [email or web address] URL: [email or web address] Date: 01/08/2024 Prepared by: Rojean Batten  Exercises - Supine Posterior Pelvic Tilt  - 2 x daily - 7 x weekly - 1-2 sets - 10 reps - 5 hold - Seated Hip Adduction Isometrics with Ball  - 2 x daily - 7 x weekly - 1-2 sets - 10 reps - 5 hold - Supine Short Arc Quad  - 2 x daily - 7 x weekly - 1-2 sets - 10  reps - 5 hold   ASSESSMENT:  CLINICAL IMPRESSION:  Continued to work on protocol based strengthening and ROM today with good tolerance. Pt very tender and tight into lateral quads, so spent time on STM to this area. Also performed passive knee flexion which pt reported felt restricted. Pt with shortened step length observed when using FWW, which improved with time. Trialed SPC as pt reports trying to use this at home. She demonstrates trunk lean and shortened step length with this. Advised she continue with FWW primarily until pain level is more controlled, but she can practice in her home on level surface. With standing exercise, she required cuing for correction of sway back posture. Will continue to progress as tolerated.  Eval:  Patient is an 83 y.o. female 5 days s/p Left hip gluteus med repair. She is ambulating with a 2WW and a lateral lean to L side and crouched gait.  She is limited with ambulation distance and has difficulty with stairs. She is currently going downstairs backwards due to pain. 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 Pt has gait deficits and L LE muscle weakness.  Pt should benefit from skilled PT services per protocol to address impairments and to improve overall function.         OBJECTIVE IMPAIRMENTS: decreased activity tolerance, difficulty walking, decreased balance, decreased endurance, decreased mobility, decreased ROM, decreased strength, impaired flexibility, impaired UE/LE use, postural dysfunction, and pain.  ACTIVITY LIMITATIONS: bending, lifting, carry, locomotion, cleaning, community activity, driving, and or occupation  PERSONAL FACTORS: Age, second surgical procedure to same location, past medical history are also affecting patient's functional outcome.  REHAB POTENTIAL: Good  CLINICAL DECISION MAKING: Stable/uncomplicated  EVALUATION  COMPLEXITY: Low    SHORT TERM GOALS: 01/28/24  Pt will be independent and compliant with HEP for improved pain, strength, and function. Baseline: Goal status: MET 9/3 Target date:      2.  Pt will demo improved quality of gait with increased stance time and toe off on L.   Baseline:  Goal status: IN PROGRESS 9/3 Target date:     3.  Pt will progress with exercises per protocol without adverse effects for improved strength and mobility.   Baseline:  Goal status: INITIAL Target date:       LONG TERM GOALS: Target date: 03/24/24     Pt will be able to ambulate extended community distance without significant difficulty and pain.   Baseline:  Goal status: INITIAL   2.  Pt will be able to perform her ADLs and IADLs including household chores without significant difficulty and pain. Baseline:  Goal status: INITIAL   3.  Pt will be able to take care of her husband without significant limitations.   Baseline:  Goal status: INITIAL   4.  Pt will be able to perform mini squats with good form and without increased pain in order for improved functional LE strength and to assist with returning to gardening activities.   Baseline:  Goal status: INITIAL  Pt will report improved tolerance with standing activities and ambulation.  Baseline:  Goal status: INITIAL Target date:       5.  Pt will ambulate with a normalized heel to toe gait without limping.  Baseline:  Goal status: INITIAL Target date:      6.  Pt will be able to perform a 6 inch step up with good form and control with L LE leading for improved performance of stairs abd and improved functional strength. Baseline:  Goal status: INITIAL Target date:       PLAN:   PT FREQUENCY: 1x/week x 2-3 weeks and 2x/wk afterwards   PT DURATION: 12-14 weeks  PLANNED INTERVENTIONS (unless contraindicated): aquatic PT, Canalith repositioning, cryotherapy, Electrical stimulation, Iontophoresis with 4 mg/ml dexamethasome, Moist  heat, traction, Ultrasound, gait training, Therapeutic exercise, balance training, neuromuscular re-education, patient/family education, prosthetic training, manual techniques, passive ROM, dry needling, taping, vasopnuematic device, vestibular, spinal manipulations, joint manipulations  PLAN FOR NEXT SESSION: Glute min repair protocol.     Asberry BRAVO Cadyn Rodger, PTA 01/19/2024, 9:55 AM

## 2024-01-26 ENCOUNTER — Encounter (HOSPITAL_BASED_OUTPATIENT_CLINIC_OR_DEPARTMENT_OTHER): Payer: Self-pay | Admitting: Physical Therapy

## 2024-01-26 ENCOUNTER — Ambulatory Visit (HOSPITAL_BASED_OUTPATIENT_CLINIC_OR_DEPARTMENT_OTHER): Admitting: Physical Therapy

## 2024-01-26 DIAGNOSIS — R293 Abnormal posture: Secondary | ICD-10-CM | POA: Diagnosis not present

## 2024-01-26 DIAGNOSIS — M6281 Muscle weakness (generalized): Secondary | ICD-10-CM | POA: Diagnosis not present

## 2024-01-26 DIAGNOSIS — R262 Difficulty in walking, not elsewhere classified: Secondary | ICD-10-CM | POA: Diagnosis not present

## 2024-01-26 DIAGNOSIS — M25552 Pain in left hip: Secondary | ICD-10-CM

## 2024-01-26 NOTE — Therapy (Signed)
 OUTPATIENT PHYSICAL THERAPY LOWER EXTREMITY TREATMENT   Patient Name: Ellen [name] MRN: [id number] DOB:1940/09/21, 83 y.o., female Today's Date: 01/27/2024  END OF SESSION:  PT End of Session - 01/26/24 1329     Visit Number 4    Number of Visits 24    Date for PT Re-Evaluation 03/24/24    Authorization Type MCR A & B    PT Start Time 1325    PT Stop Time 1358    PT Time Calculation (min) 33 min    Activity Tolerance Patient tolerated treatment well    Behavior During Therapy WFL for tasks assessed/performed             Past Medical History:  Diagnosis Date   Allergy    Anxiety    Arthritis    Bursitis    left thigh   GERD (gastroesophageal reflux disease)    History of kidney stones    Hypertension    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   Left Buccal Cheek (Keratoacanthoma)   Umbilical hernia    Past Surgical History:  Procedure Laterality Date   BOWEL RESECTION  07/09/2013   Procedure: SMALL BOWEL RESECTION;  Surgeon: Krystal CHRISTELLA Spinner, MD;  Location: [facility];  Service: General;;   RODDIE NEVIN REPAIR Left 03/22/2023   Procedure: LEFT GLUTEUS MEDIUS REPAIR WITH POSSIBLE COLLAGEN PATCH AUGMENTATION;  Surgeon: Genelle Standing, MD;  Location: [facility];  Service: Orthopedics;  Laterality: Left;   GLUTEUS MINIMUS REPAIR Left 01/04/2024   Procedure: REPAIR, TENDON, GLUTEUS MINIMUS;  Surgeon: Genelle Standing, MD;  Location: [facility];  Service: Orthopedics;  Laterality: Left;  LEFT HIP GLUTEUS MAXIMUS TENDON TRANSFER WITH COLLAGEN PATCH AUGMENTATION   LAPAROTOMY N/A 07/09/2013   Procedure: EXPLORATORY LAPAROTOMY;  Surgeon: Krystal CHRISTELLA Spinner, MD;  Location: [facility];  Service: General;  Laterality: N/A;   SHOULDER ARTHROSCOPY WITH CAPSULORRHAPHY     Patient Active Problem List   Diagnosis Date Noted   B12 deficiency 07/30/2023   Skin ulcer of ankle, limited to breakdown of skin (HCC) 04/28/2023   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   Hypokalemia 12/27/2022   Volume depletion 12/24/2022   Constipation 12/24/2022   Partial small bowel obstruction (HCC) 12/24/2022   Polycythemia 12/24/2022   Gastroesophageal reflux disease 08/26/2022   Bloating 08/26/2022   Small intestinal bacterial overgrowth (SIBO) 08/26/2022   Osteoarthritis 12/01/2021   Plantar wart 12/01/2021   Ventral hernia 01/01/2021   Diverticulosis 01/05/2019   Dermatitis 01/05/2019   Systolic murmur 07/25/2018   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   Hyperglycemia 10/05/2017   Leg edema 10/05/2017   Hammer toe 10/05/2017   Hot flashes due to menopause 10/05/2017   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/10/2013 07/11/2013   Hypertension 07/09/2013    PCP: Kennyth Worth CHRISTELLA, MD  REFERRING PROVIDER: Genelle Standing, MD  REFERRING DIAG: Tendinopathy of gluteus medius [F32.040]   THERAPY DIAG:  Pain in left hip  Muscle weakness (generalized)  Difficulty in walking, not elsewhere classified  Rationale for Evaluation and Treatment: Rehabilitation  ONSET DATE: 01/04/2024  SUBJECTIVE:   SUBJECTIVE STATEMENT:  Pt is 3 weeks and 1 day s/p L hip gluteus maximus tendon transfer and trochanteric bursectomy.  Pt states she's hurting worse this surgery than the the last surgery.  Pt has been taking meloxicam  since last post op visit though it's not really helping.  Pt ambulates with a walker and cane.  She has been ambulating more with  the cane.  Pt reports compliance with HEP.  Pt uses ice 2-3x/day.  Pt was tired after prior treatment though had no adverse effects.   Eval: Pt is a part time professional gardener. She denies any known MOI, but reports onset about a year ago. She had the same surgery Nov 2024 with no resolution. I am not sleeping well. I am able to walk backwards and sideways with minimal pain, but any forward motion is a 10/10 pain. I go down the stairs backwards. I have a double hernia which limits me from  sleeping on my stomach. I hate that I walk in a crouched position with a lean. I want to walk straight, that is my ultimate goal. 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    PERTINENT HISTORY: L hip gluteus maximus tendon transfer and trochanteric bursectomy on 01/04/24 Glute min repair, Laparotomy, Shoulder arthroscopy with capsulorrhaphy.   PAIN:  Are you having pain? Yes: NPRS scale: 2-3/10 Sitting. 7/10 walking.   Pain location: L hip down to knee, central and L sided lumbar Pain description: Constant pain Aggravating factors: Walking, standing, sitting, any hip flexion  Relieving factors: Ice.   PRECAUTIONS: Other: gute min, fall risk.  RED FLAGS: None   WEIGHT BEARING RESTRICTIONS: Yes PWB  FALLS:  Has patient fallen in last 6 months? No  LIVING ENVIRONMENT: Lives with: lives with their spouse Lives in: House/apartment Stairs: Yes: Internal: 12 steps; on right going up and External: 4 steps; bilateral but cannot reach both Has following equipment at home: Single point cane, Walker - 2 wheeled, Environmental consultant - 4 wheeled, and Grab bars  OCCUPATION: Human resources officer.   PLOF: Independent  PATIENT GOALS: Pt would like to walk straight.   NEXT MD VISIT: 01/19/2024  OBJECTIVE:  Note: Objective measures were completed at Evaluation unless otherwise noted.  DIAGNOSTIC FINDINGS: IMPRESSION: 1. Progressive fluid signal tracking between the gluteus minimus and within and along the gluteus medius muscle extending down to moderate gluteus medius tendinopathy and peritendinitis. Mild to moderate left gluteus minimus tendinopathy distally. 2. Trace left trochanteric bursitis. 3. Moderate degenerative hip arthropathy bilaterally. 4. Proximal hamstring tendinopathy and likely partial tearing similar to prior. 5. Substantial loss of intervertebral disc height at L4-5 and L5-S1 with type 2 degenerative endplate findings eccentric to the right at L4-5. 6. Intramural uterine fibroids. 7.  Sigmoid colon diverticulosis.  PATIENT SURVEYS:  25/80  COGNITION: Overall cognitive status: Within functional limits for tasks assessed     SENSATION: WFL  EDEMA:  Localized swelling from surgery.   POSTURE: No Significant postural limitations  PALPATION: Tenderness to palpation at surgical site.   LOWER EXTREMITY ROM:  Passive ROM Right eval Left eval  Hip flexion    Hip extension    Hip abduction    Hip adduction    Hip internal rotation    Hip external rotation    Knee flexion    Knee extension    Ankle dorsiflexion    Ankle plantarflexion    Ankle inversion    Ankle eversion     (Blank rows = not tested)  LOWER EXTREMITY MMT:  MMT Right eval Left eval  Hip flexion    Hip extension    Hip abduction    Hip adduction    Hip internal rotation    Hip external rotation    Knee flexion    Knee extension    Ankle dorsiflexion    Ankle plantarflexion    Ankle inversion  Ankle eversion     (Blank rows = not tested)   GAIT: Distance walked: 49ft  Assistive device utilized: Walker - 2 wheeled Level of assistance: Complete Independence Comments: Slow, antalgic gait with PWB. (30% WB per pt).                                                                                                                                 TREATMENT DATE:   01/26/24: Reviewed pt presentation, response to prior treatment, pain level, and HEP compliance.   Pt received R hip PROM in flexion and abd per protocol ranges w/n pt and tissue tolerance  Hooklying adductor squeze 5 2x10 SAQ 2x10 PPT 3x10 Quad sets with 5 sec hold x10 reps    01/19/24 -PROM R hip within protocol limits -STM to lateral R quads -glute sets 5 2x10 -Hooklying adductor squeze 5 2x10 -LAQ 5 2x10  -standing HR 2x10 -gait with FWW x29ft, with SPSC x44ft.    01/14/24 -PROM R hip within protocol limits -glute sets 5 2x10 -Hooklying adductor squeze 5 2x10 -SAQ 5 2x10 -LAQ 5 2x10       PATIENT EDUCATION:  Education details: Educated pt on anatomy and physiology of current symptoms, LEFS, diagnosis, prognosis, HEP,  and POC. Person educated: Patient Education method: Medical illustrator, verbal cues Education comprehension: verbalized understanding, returned demonstration, verbal cues required, and tactile cues required  HOME EXERCISE PROGRAM: Access Code: [email or web address] URL: [email or web address] Date: 01/08/2024 Prepared by: Rojean Batten  Exercises - Supine Posterior Pelvic Tilt  - 2 x daily - 7 x weekly - 1-2 sets - 10 reps - 5 hold - Seated Hip Adduction Isometrics with Ball  - 2 x daily - 7 x weekly - 1-2 sets - 10 reps - 5 hold - Supine Short Arc Quad  - 2 x daily - 7 x weekly - 1-2 sets - 10 reps - 5 hold   ASSESSMENT:  CLINICAL IMPRESSION:  Pt enters the clinic with SPC.   Continued to work on protocol based strengthening and ROM today with good tolerance. Pt very tender and tight into lateral quads, so spent time on STM to this area. Also performed passive knee flexion which pt reported felt restricted. Pt with shortened step length observed when using FWW, which improved with time. Trialed SPC as pt reports trying to use this at home. She demonstrates trunk lean and shortened step length with this. Advised she continue with FWW primarily until pain level is more controlled, but she can practice in her home on level surface. With standing exercise, she required cuing for correction of sway back posture. Will continue to progress as tolerated.  Eval:  Patient is an 83 y.o. female 5 days s/p Left hip gluteus med repair. She is ambulating with a 2WW and a lateral lean to L side and crouched gait.  She is limited with ambulation distance and has difficulty with stairs. She is currently going  downstairs backwards due to pain. 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 Pt is a caregiver for her husband and has  difficulty with taking care of her husband. Pt has gait deficits and L LE muscle weakness.  Pt should benefit from skilled PT services per protocol to address impairments and to improve overall function.      No increased pain    OBJECTIVE IMPAIRMENTS: decreased activity tolerance, difficulty walking, decreased balance, decreased endurance, decreased mobility, decreased ROM, decreased strength, impaired flexibility, impaired UE/LE use, postural dysfunction, and pain.  ACTIVITY LIMITATIONS: bending, lifting, carry, locomotion, cleaning, community activity, driving, and or occupation  PERSONAL FACTORS: Age, second surgical procedure to same location, past medical history are also affecting patient's functional outcome.  REHAB POTENTIAL: Good  CLINICAL DECISION MAKING: Stable/uncomplicated  EVALUATION COMPLEXITY: Low    SHORT TERM GOALS: 01/28/24  Pt will be independent and compliant with HEP for improved pain, strength, and function. Baseline: Goal status: MET 9/3 Target date:      2.  Pt will demo improved quality of gait with increased stance time and toe off on L.   Baseline:  Goal status: IN PROGRESS 9/3 Target date:     3.  Pt will progress with exercises per protocol without adverse effects for improved strength and mobility.   Baseline:  Goal status: INITIAL Target date:       LONG TERM GOALS: Target date: 03/24/24     Pt will be able to ambulate extended community distance without significant difficulty and pain.   Baseline:  Goal status: INITIAL   2.  Pt will be able to perform her ADLs and IADLs including household chores without significant difficulty and pain. Baseline:  Goal status: INITIAL   3.  Pt will be able to take care of her husband without significant limitations.   Baseline:  Goal status: INITIAL   4.  Pt will be able to perform mini squats with good form and without increased pain in order for improved functional LE strength and to assist with  returning to gardening activities.   Baseline:  Goal status: INITIAL  Pt will report improved tolerance with standing activities and ambulation.  Baseline:  Goal status: INITIAL Target date:       5.  Pt will ambulate with a normalized heel to toe gait without limping.  Baseline:  Goal status: INITIAL Target date:      6.  Pt will be able to perform a 6 inch step up with good form and control with L LE leading for improved performance of stairs abd and improved functional strength. Baseline:  Goal status: INITIAL Target date:       PLAN:   PT FREQUENCY: 1x/week x 2-3 weeks and 2x/wk afterwards   PT DURATION: 12-14 weeks  PLANNED INTERVENTIONS (unless contraindicated): aquatic PT, Canalith repositioning, cryotherapy, Electrical stimulation, Iontophoresis with 4 mg/ml dexamethasome, Moist heat, traction, Ultrasound, gait training, Therapeutic exercise, balance training, neuromuscular re-education, patient/family education, prosthetic training, manual techniques, passive ROM, dry needling, taping, vasopnuematic device, vestibular, spinal manipulations, joint manipulations  PLAN FOR NEXT SESSION: Glute min repair protocol.     Mose Minerva, PT 01/27/2024, 4:15 PM

## 2024-01-28 ENCOUNTER — Encounter (HOSPITAL_BASED_OUTPATIENT_CLINIC_OR_DEPARTMENT_OTHER): Payer: Self-pay

## 2024-01-28 ENCOUNTER — Ambulatory Visit (HOSPITAL_BASED_OUTPATIENT_CLINIC_OR_DEPARTMENT_OTHER)

## 2024-01-28 DIAGNOSIS — M25552 Pain in left hip: Secondary | ICD-10-CM

## 2024-01-28 DIAGNOSIS — R293 Abnormal posture: Secondary | ICD-10-CM | POA: Diagnosis not present

## 2024-01-28 DIAGNOSIS — R262 Difficulty in walking, not elsewhere classified: Secondary | ICD-10-CM | POA: Diagnosis not present

## 2024-01-28 DIAGNOSIS — M6281 Muscle weakness (generalized): Secondary | ICD-10-CM | POA: Diagnosis not present

## 2024-01-28 NOTE — Therapy (Signed)
 OUTPATIENT PHYSICAL THERAPY LOWER EXTREMITY TREATMENT   Patient Name: Shandy Checo MRN: [id number] DOB:June 30, 1940, 83 y.o., female Today's Date: 01/28/2024  END OF SESSION:  PT End of Session - 01/28/24 1650     Visit Number 5    Number of Visits 24    Date for PT Re-Evaluation 03/24/24    Authorization Type MCR A & B    Progress Note Due on Visit 10    PT Start Time 1604    PT Stop Time 1640    PT Time Calculation (min) 36 min    Activity Tolerance Patient tolerated treatment well    Behavior During Therapy WFL for tasks assessed/performed              Past Medical History:  Diagnosis Date   Allergy    Anxiety    Arthritis    Bursitis    left thigh   GERD (gastroesophageal reflux disease)    History of kidney stones    Hypertension    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   Left Buccal Cheek (Keratoacanthoma)   Umbilical hernia    Past Surgical History:  Procedure Laterality Date   BOWEL RESECTION  07/09/2013   Procedure: SMALL BOWEL RESECTION;  Surgeon: Krystal CHRISTELLA Spinner, MD;  Location: [facility];  Service: General;;   RODDIE NEVIN REPAIR Left 03/22/2023   Procedure: LEFT GLUTEUS MEDIUS REPAIR WITH POSSIBLE COLLAGEN PATCH AUGMENTATION;  Surgeon: Genelle Standing, MD;  Location: [facility];  Service: Orthopedics;  Laterality: Left;   GLUTEUS MINIMUS REPAIR Left 01/04/2024   Procedure: REPAIR, TENDON, GLUTEUS MINIMUS;  Surgeon: Genelle Standing, MD;  Location: [facility];  Service: Orthopedics;  Laterality: Left;  LEFT HIP GLUTEUS MAXIMUS TENDON TRANSFER WITH COLLAGEN PATCH AUGMENTATION   LAPAROTOMY N/A 07/09/2013   Procedure: EXPLORATORY LAPAROTOMY;  Surgeon: Krystal CHRISTELLA Spinner, MD;  Location: [facility];  Service: General;  Laterality: N/A;   SHOULDER ARTHROSCOPY WITH CAPSULORRHAPHY     Patient Active Problem List   Diagnosis Date Noted   B12 deficiency 07/30/2023   Skin ulcer of ankle, limited to breakdown of skin (HCC)  04/28/2023   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   Hypokalemia 12/27/2022   Volume depletion 12/24/2022   Constipation 12/24/2022   Partial small bowel obstruction (HCC) 12/24/2022   Polycythemia 12/24/2022   Gastroesophageal reflux disease 08/26/2022   Bloating 08/26/2022   Small intestinal bacterial overgrowth (SIBO) 08/26/2022   Osteoarthritis 12/01/2021   Plantar wart 12/01/2021   Ventral hernia 01/01/2021   Diverticulosis 01/05/2019   Dermatitis 01/05/2019   Systolic murmur 07/25/2018   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   Hyperglycemia 10/05/2017   Leg edema 10/05/2017   Hammer toe 10/05/2017   Hot flashes due to menopause 10/05/2017   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/10/2013 07/11/2013   Hypertension 07/09/2013    PCP: Kennyth Worth CHRISTELLA, MD  REFERRING PROVIDER: Genelle Standing, MD  REFERRING DIAG: Tendinopathy of gluteus medius [F32.040]   THERAPY DIAG:  Pain in left hip  Muscle weakness (generalized)  Difficulty in walking, not elsewhere classified  Abnormal posture  Rationale for Evaluation and Treatment: Rehabilitation  ONSET DATE: 01/04/2024  SUBJECTIVE:   SUBJECTIVE STATEMENT:  6/10 pain level today. I mowed today.     PERTINENT HISTORY: L hip gluteus maximus tendon transfer and trochanteric bursectomy on 01/04/24 Glute min repair, Laparotomy, Shoulder arthroscopy with capsulorrhaphy.   PAIN:  Are you having pain? Yes: NPRS scale: 6/10 Sitting. 7/10 walking.  Pain location: L hip down to knee, central and L sided lumbar Pain description: Constant pain Aggravating factors: Walking, standing, sitting, any hip flexion  Relieving factors: Ice.   PRECAUTIONS: Other: gute min, fall risk.  RED FLAGS: None   WEIGHT BEARING RESTRICTIONS: Yes PWB  FALLS:  Has patient fallen in last 6 months? No  LIVING ENVIRONMENT: Lives with: lives with their spouse Lives in: House/apartment Stairs: Yes: Internal: 12 steps; on  right going up and External: 4 steps; bilateral but cannot reach both Has following equipment at home: Single point cane, Walker - 2 wheeled, Environmental consultant - 4 wheeled, and Grab bars  OCCUPATION: Human resources officer.   PLOF: Independent  PATIENT GOALS: Pt would like to walk straight.   NEXT MD VISIT: 01/19/2024  OBJECTIVE:  Note: Objective measures were completed at Evaluation unless otherwise noted.  DIAGNOSTIC FINDINGS: IMPRESSION: 1. Progressive fluid signal tracking between the gluteus minimus and within and along the gluteus medius muscle extending down to moderate gluteus medius tendinopathy and peritendinitis. Mild to moderate left gluteus minimus tendinopathy distally. 2. Trace left trochanteric bursitis. 3. Moderate degenerative hip arthropathy bilaterally. 4. Proximal hamstring tendinopathy and likely partial tearing similar to prior. 5. Substantial loss of intervertebral disc height at L4-5 and L5-S1 with type 2 degenerative endplate findings eccentric to the right at L4-5. 6. Intramural uterine fibroids. 7. Sigmoid colon diverticulosis.  PATIENT SURVEYS:  25/80  COGNITION: Overall cognitive status: Within functional limits for tasks assessed     SENSATION: WFL  EDEMA:  Localized swelling from surgery.   POSTURE: No Significant postural limitations  PALPATION: Tenderness to palpation at surgical site.   LOWER EXTREMITY ROM:  Passive ROM Right eval Left eval  Hip flexion    Hip extension    Hip abduction    Hip adduction    Hip internal rotation    Hip external rotation    Knee flexion    Knee extension    Ankle dorsiflexion    Ankle plantarflexion    Ankle inversion    Ankle eversion     (Blank rows = not tested)  LOWER EXTREMITY MMT:  MMT Right eval Left eval  Hip flexion    Hip extension    Hip abduction    Hip adduction    Hip internal rotation    Hip external rotation    Knee flexion    Knee extension    Ankle dorsiflexion     Ankle plantarflexion    Ankle inversion    Ankle eversion     (Blank rows = not tested)   GAIT: Distance walked: 21ft  Assistive device utilized: Walker - 2 wheeled Level of assistance: Complete Independence Comments: Slow, antalgic gait with PWB. (30% WB per pt).                                                                                                                                 TREATMENT DATE:  01/28/24: Reviewed pt presentation, response to prior treatment, pain level, and HEP compliance.   Pt received L hip PROM in flexion and abd per protocol ranges w/n pt and tissue tolerance Roller to L quads   Hooklying adductor squeeze 5 2x10 PPT 2x10 3 hold LAQ 2 x 10  01/26/24: Reviewed pt presentation, response to prior treatment, pain level, and HEP compliance.   Pt received R hip PROM in flexion and abd per protocol ranges w/n pt and tissue tolerance  Hooklying adductor squeze 5 2x10 SAQ 2x10 PPT 3x10 Quad sets with 5 sec hold x10 reps       PATIENT EDUCATION:  Education details: Educated pt on anatomy and physiology of current symptoms, diagnosis, post op limitations and restrictions, HEP,  and POC. Person educated: Patient Education method: Medical illustrator, verbal cues Education comprehension: verbalized understanding, returned demonstration, verbal cues required, and tactile cues required  HOME EXERCISE PROGRAM: Access Code: [email or web address] URL: [email or web address] Date: 01/08/2024 Prepared by: Rojean Batten  Exercises - Supine Posterior Pelvic Tilt  - 2 x daily - 7 x weekly - 1-2 sets - 10 reps - 5 hold - Seated Hip Adduction Isometrics with Ball  - 2 x daily - 7 x weekly - 1-2 sets - 10 reps - 5 hold - Supine Short Arc Quad  - 2 x daily - 7 x weekly - 1-2 sets - 10 reps - 5 hold   ASSESSMENT:  CLINICAL IMPRESSION:  Patient very tender within left quads.  Spent time on gentle STM and roller to this area to help  decrease tightness here.  Good tolerance to gentle passive range of motion for left hip in protocol limitations.  Educated on activity modification at this time.  Patient progressing well thus far per protocol.  Will continue to progress as tolerated.    OBJECTIVE IMPAIRMENTS: decreased activity tolerance, difficulty walking, decreased balance, decreased endurance, decreased mobility, decreased ROM, decreased strength, impaired flexibility, impaired UE/LE use, postural dysfunction, and pain.  ACTIVITY LIMITATIONS: bending, lifting, carry, locomotion, cleaning, community activity, driving, and or occupation  PERSONAL FACTORS: Age, second surgical procedure to same location, past medical history are also affecting patient's functional outcome.  REHAB POTENTIAL: Good  CLINICAL DECISION MAKING: Stable/uncomplicated  EVALUATION COMPLEXITY: Low    SHORT TERM GOALS: 01/28/24  Pt will be independent and compliant with HEP for improved pain, strength, and function. Baseline: Goal status: MET 9/3 Target date:      2.  Pt will demo improved quality of gait with increased stance time and toe off on L.   Baseline:  Goal status: IN PROGRESS 9/3 Target date:     3.  Pt will progress with exercises per protocol without adverse effects for improved strength and mobility.   Baseline:  Goal status: INITIAL Target date:       LONG TERM GOALS: Target date: 03/24/24     Pt will be able to ambulate extended community distance without significant difficulty and pain.   Baseline:  Goal status: INITIAL   2.  Pt will be able to perform her ADLs and IADLs including household chores without significant difficulty and pain. Baseline:  Goal status: INITIAL   3.  Pt will be able to take care of her husband without significant limitations.   Baseline:  Goal status: INITIAL   4.  Pt will be able to perform mini squats with good form and without increased pain in order for improved functional LE  strength and to assist with returning to  gardening activities.   Baseline:  Goal status: INITIAL  Pt will report improved tolerance with standing activities and ambulation.  Baseline:  Goal status: INITIAL Target date:       5.  Pt will ambulate with a normalized heel to toe gait without limping.  Baseline:  Goal status: INITIAL Target date:      6.  Pt will be able to perform a 6 inch step up with good form and control with L LE leading for improved performance of stairs abd and improved functional strength. Baseline:  Goal status: INITIAL Target date:       PLAN:   PT FREQUENCY: 1x/week x 2-3 weeks and 2x/wk afterwards   PT DURATION: 12-14 weeks  PLANNED INTERVENTIONS (unless contraindicated): aquatic PT, Canalith repositioning, cryotherapy, Electrical stimulation, Iontophoresis with 4 mg/ml dexamethasome, Moist heat, traction, Ultrasound, gait training, Therapeutic exercise, balance training, neuromuscular re-education, patient/family education, prosthetic training, manual techniques, passive ROM, dry needling, taping, vasopnuematic device, vestibular, spinal manipulations, joint manipulations  PLAN FOR NEXT SESSION: Glute min repair protocol.     Asberry Rodes, PTA  01/28/24 4:51 PM

## 2024-02-01 ENCOUNTER — Encounter (HOSPITAL_BASED_OUTPATIENT_CLINIC_OR_DEPARTMENT_OTHER): Payer: Self-pay | Admitting: Physical Therapy

## 2024-02-01 ENCOUNTER — Ambulatory Visit (HOSPITAL_BASED_OUTPATIENT_CLINIC_OR_DEPARTMENT_OTHER): Admitting: Physical Therapy

## 2024-02-01 DIAGNOSIS — M25552 Pain in left hip: Secondary | ICD-10-CM | POA: Diagnosis not present

## 2024-02-01 DIAGNOSIS — R262 Difficulty in walking, not elsewhere classified: Secondary | ICD-10-CM

## 2024-02-01 DIAGNOSIS — M6281 Muscle weakness (generalized): Secondary | ICD-10-CM

## 2024-02-01 DIAGNOSIS — R293 Abnormal posture: Secondary | ICD-10-CM | POA: Diagnosis not present

## 2024-02-01 NOTE — Therapy (Signed)
 OUTPATIENT PHYSICAL THERAPY LOWER EXTREMITY TREATMENT   Patient Name: Ellen [name] MRN: [id number] DOB:1940-11-29, 83 y.o., female Today's Date: 02/02/2024  END OF SESSION:  PT End of Session - 02/01/2024    Visit Number 6   Number of Visits 24   Date for PT Re-Evaluation 03/24/2024   Authorization Type MCR A & B   PT Start Time  1326   PT Stop Time 1402   PT Time Calculation (min) 36 min   Activity Tolerance Patient tolerated treatment well   Behavior During Therapy  WFL for tasks assessed/performed          Past Medical History:  Diagnosis Date   Allergy    Anxiety    Arthritis    Bursitis    left thigh   GERD (gastroesophageal reflux disease)    History of kidney stones    Hypertension    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   Left Buccal Cheek (Keratoacanthoma)   Umbilical hernia    Past Surgical History:  Procedure Laterality Date   BOWEL RESECTION  07/09/2013   Procedure: SMALL BOWEL RESECTION;  Surgeon: Krystal CHRISTELLA Spinner, MD;  Location: [facility];  Service: General;;   RODDIE NEVIN REPAIR Left 03/22/2023   Procedure: LEFT GLUTEUS MEDIUS REPAIR WITH POSSIBLE COLLAGEN PATCH AUGMENTATION;  Surgeon: Genelle Standing, MD;  Location: [facility];  Service: Orthopedics;  Laterality: Left;   GLUTEUS MINIMUS REPAIR Left 01/04/2024   Procedure: REPAIR, TENDON, GLUTEUS MINIMUS;  Surgeon: Genelle Standing, MD;  Location: [facility];  Service: Orthopedics;  Laterality: Left;  LEFT HIP GLUTEUS MAXIMUS TENDON TRANSFER WITH COLLAGEN PATCH AUGMENTATION   LAPAROTOMY N/A 07/09/2013   Procedure: EXPLORATORY LAPAROTOMY;  Surgeon: Krystal CHRISTELLA Spinner, MD;  Location: [facility];  Service: General;  Laterality: N/A;   SHOULDER ARTHROSCOPY WITH CAPSULORRHAPHY     Patient Active Problem List   Diagnosis Date Noted   B12 deficiency 07/30/2023   Skin ulcer of ankle, limited to breakdown of skin (HCC) 04/28/2023   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/2023   Hypokalemia 12/27/2022   Volume depletion 12/24/2022   Constipation 12/24/2022   Partial small bowel obstruction (HCC) 12/24/2022   Polycythemia 12/24/2022   Gastroesophageal reflux disease 08/26/2022   Bloating 08/26/2022   Small intestinal bacterial overgrowth (SIBO) 08/26/2022   Osteoarthritis 12/01/2021   Plantar wart 12/01/2021   Ventral hernia 01/01/2021   Diverticulosis 01/05/2019   Dermatitis 01/05/2019   Systolic murmur 07/25/2018   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   Hyperglycemia 10/05/2017   Leg edema 10/05/2017   Hammer toe 10/05/2017   Hot flashes due to menopause 10/05/2017   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/10/2013 07/11/2013   Hypertension 07/09/2013    PCP: [place] CHRISTELLA, MD  REFERRING PROVIDER: Genelle Standing, MD  REFERRING DIAG: Tendinopathy of gluteus medius [F32.040]   THERAPY DIAG:  Pain in left hip  Muscle weakness (generalized)  Difficulty in walking, not elsewhere classified  Rationale for Evaluation and Treatment: Rehabilitation  ONSET DATE: 01/04/2024  SUBJECTIVE:   SUBJECTIVE STATEMENT:  Pt is 4 weeks s/p L hip gluteus maximus tendon transfer and trochanteric bursectomy.  It's a getting a little bit better everyday.  Pt reports she had a lot of pain the following day after prior Rx.  Pt reports compliance with HEP.  Pt is trying to wean off walker.     PERTINENT HISTORY: L hip gluteus maximus tendon transfer and trochanteric bursectomy on 01/04/24 Glute min repair,  Laparotomy, Shoulder arthroscopy with capsulorrhaphy.   PAIN:  Are you having pain? Yes: NPRS scale: 3-4/10 Sitting. 5/10 walking.   Pain location: L lateral hip down to knee, incision to knee Pain description: Constant pain Aggravating factors: Walking, standing, sitting, any hip flexion  Relieving factors: Ice.   PRECAUTIONS: Other: gute min, fall risk.  RED FLAGS: None   WEIGHT BEARING RESTRICTIONS: Yes WBAT  FALLS:   Has patient fallen in last 6 months? No  LIVING ENVIRONMENT: Lives with: lives with their spouse Lives in: House/apartment Stairs: Yes: Internal: 12 steps; on right going up and External: 4 steps; bilateral but cannot reach both Has following equipment at home: Single point cane, Walker - 2 wheeled, Environmental consultant - 4 wheeled, and Grab bars  OCCUPATION: Human resources officer.   PLOF: Independent  PATIENT GOALS: Pt would like to walk straight.   NEXT MD VISIT: 01/19/2024  OBJECTIVE:  Note: Objective measures were completed at Evaluation unless otherwise noted.  DIAGNOSTIC FINDINGS: IMPRESSION: 1. Progressive fluid signal tracking between the gluteus minimus and within and along the gluteus medius muscle extending down to moderate gluteus medius tendinopathy and peritendinitis. Mild to moderate left gluteus minimus tendinopathy distally. 2. Trace left trochanteric bursitis. 3. Moderate degenerative hip arthropathy bilaterally. 4. Proximal hamstring tendinopathy and likely partial tearing similar to prior. 5. Substantial loss of intervertebral disc height at L4-5 and L5-S1 with type 2 degenerative endplate findings eccentric to the right at L4-5. 6. Intramural uterine fibroids. 7. Sigmoid colon diverticulosis.  PATIENT SURVEYS:  25/80  COGNITION: Overall cognitive status: Within functional limits for tasks assessed     SENSATION: WFL  EDEMA:  Localized swelling from surgery.   POSTURE: No Significant postural limitations  PALPATION: Tenderness to palpation at surgical site.   LOWER EXTREMITY ROM:  Passive ROM Right eval Left eval  Hip flexion    Hip extension    Hip abduction    Hip adduction    Hip internal rotation    Hip external rotation    Knee flexion    Knee extension    Ankle dorsiflexion    Ankle plantarflexion    Ankle inversion    Ankle eversion     (Blank rows = not tested)  LOWER EXTREMITY MMT:  MMT Right eval Left eval  Hip flexion     Hip extension    Hip abduction    Hip adduction    Hip internal rotation    Hip external rotation    Knee flexion    Knee extension    Ankle dorsiflexion    Ankle plantarflexion    Ankle inversion    Ankle eversion     (Blank rows = not tested)   GAIT: Distance walked: 65ft  Assistive device utilized: Walker - 2 wheeled Level of assistance: Complete Independence Comments: Slow, antalgic gait with PWB. (30% WB per pt).  TREATMENT DATE:    02/01/24 Reviewed pt presentation, response to prior treatment, pain level, and HEP compliance.   Pt received R hip PROM in flexion and abd per protocol ranges w/n pt and tissue tolerance  Hooklying adductor squeze 5 2x10 PPT 2x10 LAQ 3x10 Q-ped rocking to 90 deg 2x10  01/28/24: Reviewed pt presentation, response to prior treatment, pain level, and HEP compliance.   Pt received L hip PROM in flexion and abd per protocol ranges w/n pt and tissue tolerance Roller to L quads   Hooklying adductor squeeze 5 2x10 PPT 2x10 3 hold LAQ 2 x 10  01/26/24: Reviewed pt presentation, response to prior treatment, pain level, and HEP compliance.   Pt received R hip PROM in flexion and abd per protocol ranges w/n pt and tissue tolerance  Hooklying adductor squeze 5 2x10 SAQ 2x10 PPT 3x10 Quad sets with 5 sec hold x10 reps       PATIENT EDUCATION:  Education details: Educated pt on anatomy and physiology of current symptoms, diagnosis, post op limitations and restrictions, HEP,  and POC. Person educated: Patient Education method: Medical illustrator, verbal cues Education comprehension: verbalized understanding, returned demonstration, verbal cues required, and tactile cues required  HOME EXERCISE PROGRAM: Access Code: [email or web address] URL: [email or web address] Date: 01/08/2024 Prepared  by: Rojean Batten  Exercises - Supine Posterior Pelvic Tilt  - 2 x daily - 7 x weekly - 1-2 sets - 10 reps - 5 hold - Seated Hip Adduction Isometrics with Ball  - 2 x daily - 7 x weekly - 1-2 sets - 10 reps - 5 hold - Supine Short Arc Quad  - 2 x daily - 7 x weekly - 1-2 sets - 10 reps - 5 hold   ASSESSMENT:  CLINICAL IMPRESSION:  Patient presents to treatment stating she is getting a little better daily.  She presents to treatment with cane and reports she is trying to wean off walker.  PT educated pt in WBAT restrictions.  Pt performed exercises per protocol well with cuing and instruction in correct form and positioning.  PT performed PROM per protocol and she tolerated PROM well.  Pt responded well to treatment reporting no increased pain after treatment and states she feels better.  She should benefit from skilled PT per protocol to address impairments and improve overall function.     OBJECTIVE IMPAIRMENTS: decreased activity tolerance, difficulty walking, decreased balance, decreased endurance, decreased mobility, decreased ROM, decreased strength, impaired flexibility, impaired UE/LE use, postural dysfunction, and pain.  ACTIVITY LIMITATIONS: bending, lifting, carry, locomotion, cleaning, community activity, driving, and or occupation  PERSONAL FACTORS: Age, second surgical procedure to same location, past medical history are also affecting patient's functional outcome.  REHAB POTENTIAL: Good  CLINICAL DECISION MAKING: Stable/uncomplicated  EVALUATION COMPLEXITY: Low    SHORT TERM GOALS: 01/28/24  Pt will be independent and compliant with HEP for improved pain, strength, and function. Baseline: Goal status: MET 9/3 Target date:      2.  Pt will demo improved quality of gait with increased stance time and toe off on L.   Baseline:  Goal status: IN PROGRESS 9/3 Target date:     3.  Pt will progress with exercises per protocol without adverse effects for improved strength  and mobility.   Baseline:  Goal status: INITIAL Target date:       LONG TERM GOALS: Target date: 03/24/24     Pt will be able to ambulate extended community distance without significant difficulty and pain.  Baseline:  Goal status: INITIAL   2.  Pt will be able to perform her ADLs and IADLs including household chores without significant difficulty and pain. Baseline:  Goal status: INITIAL   3.  Pt will be able to take care of her husband without significant limitations.   Baseline:  Goal status: INITIAL   4.  Pt will be able to perform mini squats with good form and without increased pain in order for improved functional LE strength and to assist with returning to gardening activities.   Baseline:  Goal status: INITIAL  Pt will report improved tolerance with standing activities and ambulation.  Baseline:  Goal status: INITIAL Target date:       5.  Pt will ambulate with a normalized heel to toe gait without limping.  Baseline:  Goal status: INITIAL Target date:      6.  Pt will be able to perform a 6 inch step up with good form and control with L LE leading for improved performance of stairs abd and improved functional strength. Baseline:  Goal status: INITIAL Target date:       PLAN:   PT FREQUENCY: 1x/week x 2-3 weeks and 2x/wk afterwards   PT DURATION: 12-14 weeks  PLANNED INTERVENTIONS (unless contraindicated): aquatic PT, Canalith repositioning, cryotherapy, Electrical stimulation, Iontophoresis with 4 mg/ml dexamethasome, Moist heat, traction, Ultrasound, gait training, Therapeutic exercise, balance training, neuromuscular re-education, patient/family education, prosthetic training, manual techniques, passive ROM, dry needling, taping, vasopnuematic device, vestibular, spinal manipulations, joint manipulations  PLAN FOR NEXT SESSION:  Cont per Dr. Danetta Patch med repair protocol.     Leigh Minerva III PT, DPT 02/02/24 11:02 PM

## 2024-02-03 ENCOUNTER — Encounter (HOSPITAL_BASED_OUTPATIENT_CLINIC_OR_DEPARTMENT_OTHER): Payer: Self-pay

## 2024-02-03 ENCOUNTER — Ambulatory Visit (HOSPITAL_BASED_OUTPATIENT_CLINIC_OR_DEPARTMENT_OTHER)

## 2024-02-03 DIAGNOSIS — M6281 Muscle weakness (generalized): Secondary | ICD-10-CM

## 2024-02-03 DIAGNOSIS — M25552 Pain in left hip: Secondary | ICD-10-CM

## 2024-02-03 DIAGNOSIS — R293 Abnormal posture: Secondary | ICD-10-CM

## 2024-02-03 DIAGNOSIS — R262 Difficulty in walking, not elsewhere classified: Secondary | ICD-10-CM | POA: Diagnosis not present

## 2024-02-03 NOTE — Therapy (Signed)
 OUTPATIENT PHYSICAL THERAPY LOWER EXTREMITY TREATMENT   Patient Name: Ellen [name] MRN: [id number] DOB:12-13-1940, 83 y.o., female Today's Date: 02/03/2024  END OF SESSION:  PT End of Session - 02/03/24 1423     Visit Number 7    Number of Visits 24    Date for Recertification  03/24/24    Authorization Type MCR A & B    Progress Note Due on Visit 10    PT Start Time 1300    PT Stop Time 1345    PT Time Calculation (min) 45 min    Activity Tolerance Patient tolerated treatment well    Behavior During Therapy [facility] for tasks assessed/performed         PT End of Session - 02/01/2024    Visit Number 6   Number of Visits 24   Date for PT Re-Evaluation 03/24/2024   Authorization Type MCR A & B   PT Start Time  1326   PT Stop Time 1402   PT Time Calculation (min) 36 min   Activity Tolerance Patient tolerated treatment well   Behavior During Therapy  WFL for tasks assessed/performed          Past Medical History:  Diagnosis Date   Allergy    Anxiety    Arthritis    Bursitis    left thigh   GERD (gastroesophageal reflux disease)    History of kidney stones    Hypertension    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   Left Buccal Cheek (Keratoacanthoma)   Umbilical hernia    Past Surgical History:  Procedure Laterality Date   BOWEL RESECTION  07/09/2013   Procedure: SMALL BOWEL RESECTION;  Surgeon: Krystal CHRISTELLA Spinner, MD;  Location: [facility];  Service: General;;   RODDIE NEVIN REPAIR Left 03/22/2023   Procedure: LEFT GLUTEUS MEDIUS REPAIR WITH POSSIBLE COLLAGEN PATCH AUGMENTATION;  Surgeon: Genelle Standing, MD;  Location: [facility];  Service: Orthopedics;  Laterality: Left;   GLUTEUS MINIMUS REPAIR Left 01/04/2024   Procedure: REPAIR, TENDON, GLUTEUS MINIMUS;  Surgeon: Genelle Standing, MD;  [facility];  Service: Orthopedics;  Laterality: Left;  LEFT HIP GLUTEUS MAXIMUS TENDON TRANSFER WITH COLLAGEN PATCH  AUGMENTATION   LAPAROTOMY N/A 07/09/2013   Procedure: EXPLORATORY LAPAROTOMY;  Surgeon: Krystal CHRISTELLA Spinner, MD;  Location: [facility];  Service: General;  Laterality: N/A;   SHOULDER ARTHROSCOPY WITH CAPSULORRHAPHY     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   Hypokalemia 12/27/2022   Volume depletion 12/24/2022   Constipation 12/24/2022   Partial small bowel obstruction (HCC) 12/24/2022   Polycythemia 12/24/2022   Gastroesophageal reflux disease 08/26/2022   Bloating 08/26/2022   Small intestinal bacterial overgrowth (SIBO) 08/26/2022   Osteoarthritis 12/01/2021   Plantar wart 12/01/2021   Ventral hernia 01/01/2021   Diverticulosis 01/05/2019   Dermatitis 01/05/2019   Systolic murmur 07/25/2018   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   Hyperglycemia 10/05/2017   Leg edema 10/05/2017   Hammer toe 10/05/2017   Hot flashes due to menopause 10/05/2017   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/10/2013 07/11/2013   Hypertension 07/09/2013    PCP: [place] CHRISTELLA, MD  REFERRING PROVIDER: Genelle Standing, MD  REFERRING DIAG: 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     PERTINENT HISTORY: L hip gluteus maximus tendon transfer and trochanteric bursectomy on 01/04/24 Glute min repair, Laparotomy, Shoulder arthroscopy with capsulorrhaphy.   PAIN:  Are you having pain? Yes: NPRS scale: 4/10 Sitting. 5/10 walking.   Pain location: L lateral hip down to knee, incision to knee Pain description: Constant  pain Aggravating factors: Walking, standing, sitting, any hip flexion  Relieving factors: Ice.   PRECAUTIONS: Other: gute min, fall risk.  RED FLAGS: None   WEIGHT BEARING RESTRICTIONS: Yes WBAT  FALLS:  Has patient fallen in last 6 months? No  LIVING ENVIRONMENT: Lives with: lives with their spouse Lives in: House/apartment Stairs: Yes: Internal: 12 steps; on right going up and External: 4 steps; bilateral but cannot reach both Has following equipment at home: Single point cane, Walker - 2 wheeled, Environmental consultant - 4 wheeled, and Grab bars  OCCUPATION: Human resources officer.   PLOF: Independent  PATIENT GOALS: Pt would like to walk straight.   NEXT MD VISIT: 01/19/2024  OBJECTIVE:  Note: Objective measures were completed at Evaluation unless otherwise noted.  DIAGNOSTIC FINDINGS: IMPRESSION: 1. Progressive fluid signal tracking between the gluteus minimus and within and along the gluteus medius muscle extending down to moderate gluteus medius tendinopathy and peritendinitis. Mild to moderate left gluteus minimus tendinopathy distally. 2. Trace left trochanteric bursitis. 3. Moderate degenerative hip arthropathy bilaterally. 4. Proximal hamstring tendinopathy and likely partial tearing similar to prior. 5. Substantial loss of intervertebral disc height at L4-5 and L5-S1 with type 2 degenerative endplate findings eccentric to the right at L4-5. 6. Intramural uterine fibroids. 7. Sigmoid colon diverticulosis.  PATIENT SURVEYS:  25/80  COGNITION: Overall cognitive status: Within functional limits for tasks assessed     SENSATION: WFL  EDEMA:  Localized swelling from surgery.   POSTURE: No Significant postural limitations  PALPATION: Tenderness to palpation at surgical site.   LOWER EXTREMITY ROM:  Passive ROM Right eval Left eval  Hip flexion    Hip extension    Hip abduction    Hip adduction    Hip internal rotation    Hip external rotation    Knee  flexion    Knee extension    Ankle dorsiflexion    Ankle plantarflexion    Ankle inversion    Ankle eversion     (Blank rows = not tested)  LOWER EXTREMITY MMT:  MMT Right eval Left eval  Hip flexion    Hip extension    Hip abduction    Hip adduction    Hip internal rotation    Hip external rotation    Knee flexion    Knee extension    Ankle dorsiflexion    Ankle plantarflexion    Ankle inversion    Ankle eversion     (Blank rows = not tested)   GAIT: Distance walked: 65ft  Assistive device utilized: Walker - 2 wheeled Level of assistance: Complete Independence Comments: Slow, antalgic gait with PWB. (30% WB per pt).  TREATMENT DATE:    02/03/24 PROM L hip Hooklying adductor sqz 5 2x10 Hooklying hip flexor isoemtric 5 x10 Hooklying hip abduction isometric 5 x10 Q-ped weight shifting x10ea LAQ 1.5# 5 2x10 Standing HR x20    02/01/24 Reviewed pt presentation, response to prior treatment, pain level, and HEP compliance.   Pt received R hip PROM in flexion and abd per protocol ranges w/n pt and tissue tolerance  Hooklying adductor squeze 5 2x10 PPT 2x10 LAQ 3x10 Q-ped rocking to 90 deg 2x10  01/28/24: Reviewed pt presentation, response to prior treatment, pain level, and HEP compliance.   Pt received L hip PROM in flexion and abd per protocol ranges w/n pt and tissue tolerance Roller to L quads   Hooklying adductor squeeze 5 2x10 PPT 2x10 3 hold LAQ 2 x 10  01/26/24: Reviewed pt presentation, response to prior treatment, pain level, and HEP compliance.   Pt received R hip PROM in flexion and abd per protocol ranges w/n pt and tissue tolerance  Hooklying adductor squeze 5 2x10 SAQ 2x10 PPT 3x10 Quad sets with 5 sec hold x10 reps       PATIENT EDUCATION:  Education details: Educated pt on anatomy and  physiology of current symptoms, diagnosis, post op limitations and restrictions, HEP,  and POC. Person educated: Patient Education method: Medical illustrator, verbal cues Education comprehension: verbalized understanding, returned demonstration, verbal cues required, and tactile cues required  HOME EXERCISE PROGRAM: Access Code: [email or web address] URL: [email or web address] Date: 01/08/2024 Prepared by: Rojean Batten  Exercises - Supine Posterior Pelvic Tilt  - 2 x daily - 7 x weekly - 1-2 sets - 10 reps - 5 hold - Seated Hip Adduction Isometrics with Ball  - 2 x daily - 7 x weekly - 1-2 sets - 10 reps - 5 hold - Supine Short Arc Quad  - 2 x daily - 7 x weekly - 1-2 sets - 10 reps - 5 hold   ASSESSMENT:  CLINICAL IMPRESSION:  Pt able to progress with protocol today. Kept repetitions low with progressions to monitor response. Good tolerance for gentle isometric strengthening exercises. Mild soreness by end of session. Will updated HEP next visit if positive response.   OBJECTIVE IMPAIRMENTS: decreased activity tolerance, difficulty walking, decreased balance, decreased endurance, decreased mobility, decreased ROM, decreased strength, impaired flexibility, impaired UE/LE use, postural dysfunction, and pain.  ACTIVITY LIMITATIONS: bending, lifting, carry, locomotion, cleaning, community activity, driving, and or occupation  PERSONAL FACTORS: Age, second surgical procedure to same location, past medical history are also affecting patient's functional outcome.  REHAB POTENTIAL: Good  CLINICAL DECISION MAKING: Stable/uncomplicated  EVALUATION COMPLEXITY: Low    SHORT TERM GOALS: 01/28/24  Pt will be independent and compliant with HEP for improved pain, strength, and function. Baseline: Goal status: MET 9/3 Target date:      2.  Pt will demo improved quality of gait with increased stance time and toe off on L.   Baseline:  Goal status: IN PROGRESS 9/3 Target  date:     3.  Pt will progress with exercises per protocol without adverse effects for improved strength and mobility.   Baseline:  Goal status: INITIAL Target date:       LONG TERM GOALS: Target date: 03/24/24     Pt will be able to ambulate extended community distance without significant difficulty and pain.   Baseline:  Goal status: INITIAL   2.  Pt will be able to perform her ADLs and IADLs including household chores without significant  difficulty and pain. Baseline:  Goal status: INITIAL   3.  Pt will be able to take care of her husband without significant limitations.   Baseline:  Goal status: INITIAL   4.  Pt will be able to perform mini squats with good form and without increased pain in order for improved functional LE strength and to assist with returning to gardening activities.   Baseline:  Goal status: INITIAL  Pt will report improved tolerance with standing activities and ambulation.  Baseline:  Goal status: INITIAL Target date:       5.  Pt will ambulate with a normalized heel to toe gait without limping.  Baseline:  Goal status: INITIAL Target date:      6.  Pt will be able to perform a 6 inch step up with good form and control with L LE leading for improved performance of stairs abd and improved functional strength. Baseline:  Goal status: INITIAL Target date:       PLAN:   PT FREQUENCY: 1x/week x 2-3 weeks and 2x/wk afterwards   PT DURATION: 12-14 weeks  PLANNED INTERVENTIONS (unless contraindicated): aquatic PT, Canalith repositioning, cryotherapy, Electrical stimulation, Iontophoresis with 4 mg/ml dexamethasome, Moist heat, traction, Ultrasound, gait training, Therapeutic exercise, balance training, neuromuscular re-education, patient/family education, prosthetic training, manual techniques, passive ROM, dry needling, taping, vasopnuematic device, vestibular, spinal manipulations, joint manipulations  PLAN FOR NEXT SESSION:  Cont per Dr.  Danetta Patch med repair protocol.     Asberry Rodes, PTA  02/03/24 3:04 PM

## 2024-02-07 ENCOUNTER — Other Ambulatory Visit: Payer: Self-pay | Admitting: Family Medicine

## 2024-02-07 DIAGNOSIS — Z1231 Encounter for screening mammogram for malignant neoplasm of breast: Secondary | ICD-10-CM

## 2024-02-08 ENCOUNTER — Encounter (HOSPITAL_BASED_OUTPATIENT_CLINIC_OR_DEPARTMENT_OTHER): Admitting: Physical Therapy

## 2024-02-08 ENCOUNTER — Encounter (HOSPITAL_BASED_OUTPATIENT_CLINIC_OR_DEPARTMENT_OTHER): Payer: Self-pay | Admitting: Physical Therapy

## 2024-02-08 ENCOUNTER — Ambulatory Visit (HOSPITAL_BASED_OUTPATIENT_CLINIC_OR_DEPARTMENT_OTHER): Admitting: Physical Therapy

## 2024-02-08 DIAGNOSIS — R262 Difficulty in walking, not elsewhere classified: Secondary | ICD-10-CM | POA: Diagnosis not present

## 2024-02-08 DIAGNOSIS — R293 Abnormal posture: Secondary | ICD-10-CM | POA: Diagnosis not present

## 2024-02-08 DIAGNOSIS — M6281 Muscle weakness (generalized): Secondary | ICD-10-CM

## 2024-02-08 DIAGNOSIS — M25552 Pain in left hip: Secondary | ICD-10-CM

## 2024-02-08 NOTE — Therapy (Signed)
 OUTPATIENT PHYSICAL THERAPY LOWER EXTREMITY TREATMENT   Patient Name: Ellen [name] MRN: [id number] DOB:June 16, 1940, 83 y.o., female Today's Date: 02/09/2024  END OF SESSION:  PT End of Session - 02/08/24 1329     Visit Number 8    Number of Visits 24    Date for Recertification  03/24/24    Authorization Type MCR A & B    PT Start Time 1325    PT Stop Time 1359    PT Time Calculation (min) 34 min    Activity Tolerance Patient tolerated treatment well    Behavior During Therapy WFL for tasks assessed/performed                 Past Medical History:  Diagnosis Date   Allergy    Anxiety    Arthritis    Bursitis    left thigh   GERD (gastroesophageal reflux disease)    History of kidney stones    Hypertension    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   Left Buccal Cheek (Keratoacanthoma)   Umbilical hernia    Past Surgical History:  Procedure Laterality Date   BOWEL RESECTION  07/09/2013   Procedure: SMALL BOWEL RESECTION;  Surgeon: Krystal CHRISTELLA Spinner, MD;  Location: [facility];  Service: General;;   RODDIE NEVIN REPAIR Left 03/22/2023   Procedure: LEFT GLUTEUS MEDIUS REPAIR WITH POSSIBLE COLLAGEN PATCH AUGMENTATION;  Surgeon: Genelle Standing, MD;  Location: [facility];  Service: Orthopedics;  Laterality: Left;   GLUTEUS MINIMUS REPAIR Left 01/04/2024   Procedure: REPAIR, TENDON, GLUTEUS MINIMUS;  Surgeon: Genelle Standing, MD;  Location: [facility];  Service: Orthopedics;  Laterality: Left;  LEFT HIP GLUTEUS MAXIMUS TENDON TRANSFER WITH COLLAGEN PATCH AUGMENTATION   LAPAROTOMY N/A 07/09/2013   Procedure: EXPLORATORY LAPAROTOMY;  Surgeon: Krystal CHRISTELLA Spinner, MD;  Location: [facility];  Service: General;  Laterality: N/A;   SHOULDER ARTHROSCOPY WITH CAPSULORRHAPHY     Patient Active Problem List   Diagnosis Date Noted   B12 deficiency 07/30/2023   Skin ulcer of ankle, limited to breakdown of skin (HCC) 04/28/2023   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   Hypokalemia 12/27/2022   Volume depletion 12/24/2022   Constipation 12/24/2022   Partial small bowel obstruction (HCC) 12/24/2022   Polycythemia 12/24/2022   Gastroesophageal reflux disease 08/26/2022   Bloating 08/26/2022   Small intestinal bacterial overgrowth (SIBO) 08/26/2022   Osteoarthritis 12/01/2021   Plantar wart 12/01/2021   Ventral hernia 01/01/2021   Diverticulosis 01/05/2019   Dermatitis 01/05/2019   Systolic murmur 07/25/2018   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   Hyperglycemia 10/05/2017   Leg edema 10/05/2017   Hammer toe 10/05/2017   Hot flashes due to menopause 10/05/2017   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/10/2013 07/11/2013   Hypertension 07/09/2013    PCP: Kennyth Worth CHRISTELLA, MD  REFERRING PROVIDER: Genelle Standing, MD  REFERRING DIAG: Tendinopathy of gluteus medius [F32.040]   THERAPY DIAG:  Pain in left hip  Muscle weakness (generalized)  Difficulty in walking, not elsewhere classified  Rationale for Evaluation and Treatment: Rehabilitation  ONSET DATE: 01/04/2024  SUBJECTIVE:   SUBJECTIVE STATEMENT:  Pt is 5 weeks post op. Pt reports a little increased pain after prior Rx.  Pt states some of the exercises she did were very beneficial.  Pt reports compliance with HEP.      PERTINENT HISTORY: L hip gluteus maximus tendon transfer and trochanteric bursectomy on 01/04/24 Glute min repair, Laparotomy, Shoulder arthroscopy  with capsulorrhaphy.   PAIN:  Are you having pain? Yes: NPRS scale: 5/10   Pain location: L lateral hip down to knee, Pt states it hurts at the incision Pain description: Constant pain Aggravating factors: Walking, standing, sitting, any hip flexion  Relieving factors: Ice.   PRECAUTIONS: Other: gute min, fall risk.  RED FLAGS: None   WEIGHT BEARING RESTRICTIONS: Yes WBAT  FALLS:  Has patient fallen in last 6 months? No  LIVING ENVIRONMENT: Lives with: lives with  their spouse Lives in: House/apartment Stairs: Yes: Internal: 12 steps; on right going up and External: 4 steps; bilateral but cannot reach both Has following equipment at home: Single point cane, Walker - 2 wheeled, Environmental consultant - 4 wheeled, and Grab bars  OCCUPATION: Human resources officer.   PLOF: Independent  PATIENT GOALS: Pt would like to walk straight.   NEXT MD VISIT: 01/19/2024  OBJECTIVE:  Note: Objective measures were completed at Evaluation unless otherwise noted.  DIAGNOSTIC FINDINGS: IMPRESSION: 1. Progressive fluid signal tracking between the gluteus minimus and within and along the gluteus medius muscle extending down to moderate gluteus medius tendinopathy and peritendinitis. Mild to moderate left gluteus minimus tendinopathy distally. 2. Trace left trochanteric bursitis. 3. Moderate degenerative hip arthropathy bilaterally. 4. Proximal hamstring tendinopathy and likely partial tearing similar to prior. 5. Substantial loss of intervertebral disc height at L4-5 and L5-S1 with type 2 degenerative endplate findings eccentric to the right at L4-5. 6. Intramural uterine fibroids. 7. Sigmoid colon diverticulosis.  PATIENT SURVEYS:  25/80  COGNITION: Overall cognitive status: Within functional limits for tasks assessed     SENSATION: WFL  EDEMA:  Localized swelling from surgery.   POSTURE: No Significant postural limitations  PALPATION: Tenderness to palpation at surgical site.   LOWER EXTREMITY ROM:  Passive ROM Right eval Left eval  Hip flexion    Hip extension    Hip abduction    Hip adduction    Hip internal rotation    Hip external rotation    Knee flexion    Knee extension    Ankle dorsiflexion    Ankle plantarflexion    Ankle inversion    Ankle eversion     (Blank rows = not tested)  LOWER EXTREMITY MMT:  MMT Right eval Left eval  Hip flexion    Hip extension    Hip abduction    Hip adduction    Hip internal rotation    Hip  external rotation    Knee flexion    Knee extension    Ankle dorsiflexion    Ankle plantarflexion    Ankle inversion    Ankle eversion     (Blank rows = not tested)   GAIT: Distance walked: 88ft  Assistive device utilized: Walker - 2 wheeled Level of assistance: Complete Independence Comments: Slow, antalgic gait with PWB. (30% WB per pt).  TREATMENT DATE:    02/08/24 Hooklying adductor sqz 5 2x10 Q-ped rocking 2x10 Seated submax hip flexion isometric with 5 sec hold x 10 reps LAQ  1.5#  2x10, 1x5 Supine bridge x10  Pt received L hip PROM in flexion and abd per protocol ranges w/n pt and tissue tolerance  02/03/24 PROM L hip Hooklying adductor sqz 5 2x10 Hooklying hip flexor isoemtric 5 x10 Hooklying hip abduction isometric 5 x10 Q-ped weight shifting x10ea LAQ 1.5# 5 2x10 Standing HR x20    02/01/24 Reviewed pt presentation, response to prior treatment, pain level, and HEP compliance.   Pt received R hip PROM in flexion and abd per protocol ranges w/n pt and tissue tolerance  Hooklying adductor squeze 5 2x10 PPT 2x10 LAQ 3x10 Q-ped rocking to 90 deg 2x10  01/28/24: Reviewed pt presentation, response to prior treatment, pain level, and HEP compliance.   Pt received L hip PROM in flexion and abd per protocol ranges w/n pt and tissue tolerance Roller to L quads   Hooklying adductor squeeze 5 2x10 PPT 2x10 3 hold LAQ 2 x 10  01/26/24: Reviewed pt presentation, response to prior treatment, pain level, and HEP compliance.   Pt received R hip PROM in flexion and abd per protocol ranges w/n pt and tissue tolerance  Hooklying adductor squeze 5 2x10 SAQ 2x10 PPT 3x10 Quad sets with 5 sec hold x10 reps       PATIENT EDUCATION:  Education details: relevant anatomy, exercise form, diagnosis, post op limitations and  restrictions, HEP,  and POC. Person educated: Patient Education method: Medical illustrator, verbal cues Education comprehension: verbalized understanding, returned demonstration, verbal cues required, and tactile cues required  HOME EXERCISE PROGRAM: Access Code: [email or web address] URL: [email or web address] Date: 01/08/2024 Prepared by: Rojean Batten  Exercises - Supine Posterior Pelvic Tilt  - 2 x daily - 7 x weekly - 1-2 sets - 10 reps - 5 hold - Seated Hip Adduction Isometrics with Ball  - 2 x daily - 7 x weekly - 1-2 sets - 10 reps - 5 hold - Supine Short Arc Quad  - 2 x daily - 7 x weekly - 1-2 sets - 10 reps - 5 hold   ASSESSMENT:  CLINICAL IMPRESSION:  Pt is progressing with protocol.  Pt performed exercises well with cuing and instruction in correct form.  Pt has decreased height with bridges.  Pt tolerated PROM per protocol well.  Pt reports increased pain from 5/10 before Rx to 6/10 after Rx.  She should benefit from cont skilled PT per protocol to address impairments and goals and to improve overall function.    OBJECTIVE IMPAIRMENTS: decreased activity tolerance, difficulty walking, decreased balance, decreased endurance, decreased mobility, decreased ROM, decreased strength, impaired flexibility, impaired UE/LE use, postural dysfunction, and pain.  ACTIVITY LIMITATIONS: bending, lifting, carry, locomotion, cleaning, community activity, driving, and or occupation  PERSONAL FACTORS: Age, second surgical procedure to same location, past medical history are also affecting patient's functional outcome.  REHAB POTENTIAL: Good  CLINICAL DECISION MAKING: Stable/uncomplicated  EVALUATION COMPLEXITY: Low    SHORT TERM GOALS: 01/28/24  Pt will be independent and compliant with HEP for improved pain, strength, and function. Baseline: Goal status: MET 9/3 Target date:      2.  Pt will demo improved quality of gait with increased stance time and toe off on  L.   Baseline:  Goal status: IN PROGRESS 9/3 Target date:     3.  Pt will progress with exercises per protocol  without adverse effects for improved strength and mobility.   Baseline:  Goal status: INITIAL Target date:       LONG TERM GOALS: Target date: 03/24/24     Pt will be able to ambulate extended community distance without significant difficulty and pain.   Baseline:  Goal status: INITIAL   2.  Pt will be able to perform her ADLs and IADLs including household chores without significant difficulty and pain. Baseline:  Goal status: INITIAL   3.  Pt will be able to take care of her husband without significant limitations.   Baseline:  Goal status: INITIAL   4.  Pt will be able to perform mini squats with good form and without increased pain in order for improved functional LE strength and to assist with returning to gardening activities.   Baseline:  Goal status: INITIAL  Pt will report improved tolerance with standing activities and ambulation.  Baseline:  Goal status: INITIAL Target date:       5.  Pt will ambulate with a normalized heel to toe gait without limping.  Baseline:  Goal status: INITIAL Target date:      6.  Pt will be able to perform a 6 inch step up with good form and control with L LE leading for improved performance of stairs abd and improved functional strength. Baseline:  Goal status: INITIAL Target date:       PLAN:   PT FREQUENCY: 1x/week x 2-3 weeks and 2x/wk afterwards   PT DURATION: 12-14 weeks  PLANNED INTERVENTIONS (unless contraindicated): aquatic PT, Canalith repositioning, cryotherapy, Electrical stimulation, Iontophoresis with 4 mg/ml dexamethasome, Moist heat, traction, Ultrasound, gait training, Therapeutic exercise, balance training, neuromuscular re-education, patient/family education, prosthetic training, manual techniques, passive ROM, dry needling, taping, vasopnuematic device, vestibular, spinal manipulations, joint  manipulations  PLAN FOR NEXT SESSION:  Cont per Dr. Danetta Patch med repair protocol.  Possibly try upright bike and standing TKE next visit.    Leigh Minerva III PT, DPT 02/10/24 12:29 AM

## 2024-02-10 ENCOUNTER — Encounter (HOSPITAL_BASED_OUTPATIENT_CLINIC_OR_DEPARTMENT_OTHER): Admitting: Physical Therapy

## 2024-02-10 ENCOUNTER — Ambulatory Visit (HOSPITAL_BASED_OUTPATIENT_CLINIC_OR_DEPARTMENT_OTHER): Admitting: Physical Therapy

## 2024-02-10 DIAGNOSIS — R262 Difficulty in walking, not elsewhere classified: Secondary | ICD-10-CM

## 2024-02-10 DIAGNOSIS — M6281 Muscle weakness (generalized): Secondary | ICD-10-CM

## 2024-02-10 DIAGNOSIS — M25552 Pain in left hip: Secondary | ICD-10-CM

## 2024-02-10 DIAGNOSIS — R293 Abnormal posture: Secondary | ICD-10-CM | POA: Diagnosis not present

## 2024-02-10 NOTE — Therapy (Signed)
 OUTPATIENT PHYSICAL THERAPY LOWER EXTREMITY TREATMENT   Patient Name: Ellen [name] MRN: [id number] DOB:05/08/1941, 83 y.o., female Today's Date: 02/11/2024  END OF SESSION:  PT End of Session - 02/10/24 1327     Visit Number 9    Number of Visits 24    Date for Recertification  03/24/24    Authorization Type MCR A & B    PT Start Time 1324    PT Stop Time 1409    PT Time Calculation (min) 45 min    Activity Tolerance Patient tolerated treatment well    Behavior During Therapy WFL for tasks assessed/performed                 Past Medical History:  Diagnosis Date   Allergy    Anxiety    Arthritis    Bursitis    left thigh   GERD (gastroesophageal reflux disease)    History of kidney stones    Hypertension    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   Left Buccal Cheek (Keratoacanthoma)   Umbilical hernia    Past Surgical History:  Procedure Laterality Date   BOWEL RESECTION  07/09/2013   Procedure: SMALL BOWEL RESECTION;  Surgeon: Krystal CHRISTELLA Spinner, MD;  Location: [facility];  Service: General;;   RODDIE NEVIN REPAIR Left 03/22/2023   Procedure: LEFT GLUTEUS MEDIUS REPAIR WITH POSSIBLE COLLAGEN PATCH AUGMENTATION;  Surgeon: Genelle Standing, MD;  Location: [facility];  Service: Orthopedics;  Laterality: Left;   GLUTEUS MINIMUS REPAIR Left 01/04/2024   Procedure: REPAIR, TENDON, GLUTEUS MINIMUS;  Surgeon: Genelle Standing, MD;  Location: [facility];  Service: Orthopedics;  Laterality: Left;  LEFT HIP GLUTEUS MAXIMUS TENDON TRANSFER WITH COLLAGEN PATCH AUGMENTATION   LAPAROTOMY N/A 07/09/2013   Procedure: EXPLORATORY LAPAROTOMY;  Surgeon: Krystal CHRISTELLA Spinner, MD;  Location: [facility];  Service: General;  Laterality: N/A;   SHOULDER ARTHROSCOPY WITH CAPSULORRHAPHY     Patient Active Problem List   Diagnosis Date Noted   B12 deficiency 07/30/2023   Skin ulcer of ankle, limited to breakdown of skin (HCC) 04/28/2023   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   Hypokalemia 12/27/2022   Volume depletion 12/24/2022   Constipation 12/24/2022   Partial small bowel obstruction (HCC) 12/24/2022   Polycythemia 12/24/2022   Gastroesophageal reflux disease 08/26/2022   Bloating 08/26/2022   Small intestinal bacterial overgrowth (SIBO) 08/26/2022   Osteoarthritis 12/01/2021   Plantar wart 12/01/2021   Ventral hernia 01/01/2021   Diverticulosis 01/05/2019   Dermatitis 01/05/2019   Systolic murmur 07/25/2018   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   Hyperglycemia 10/05/2017   Leg edema 10/05/2017   Hammer toe 10/05/2017   Hot flashes due to menopause 10/05/2017   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/10/2013 07/11/2013   Hypertension 07/09/2013    PCP: Kennyth Worth CHRISTELLA, MD  REFERRING PROVIDER: Genelle Standing, MD  REFERRING DIAG: 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  Pt states she is hurting today.  Pt states she did too much yesterday.  She planted pansies around her neighbor's home yesterday and had increases pain.  She continues to have pain now.   Pt reports compliance with HEP.  Pt denies any adverse effects  after prior Rx.       PERTINENT HISTORY: L hip gluteus maximus tendon transfer and trochanteric bursectomy on 01/04/24 Glute min repair, Laparotomy, Shoulder arthroscopy with capsulorrhaphy.   PAIN:  Are you having pain? Yes: NPRS scale: 4-5/10   Pain location: L lateral hip down to knee, Pt states it hurts at the incision Pain description: Constant pain Aggravating factors: Walking, standing, sitting, any hip flexion  Relieving factors: Ice.   PRECAUTIONS: Other: gute min, fall risk.  RED  FLAGS: None   WEIGHT BEARING RESTRICTIONS: Yes WBAT  FALLS:  Has patient fallen in last 6 months? No  LIVING ENVIRONMENT: Lives with: lives with their spouse Lives in: House/apartment Stairs: Yes: Internal: 12 steps; on right going up and External: 4 steps; bilateral but cannot reach both Has following equipment at home: Single point cane, Walker - 2 wheeled, Environmental consultant - 4 wheeled, and Grab bars  OCCUPATION: Human resources officer.   PLOF: Independent  PATIENT GOALS: Pt would like to walk straight.   NEXT MD VISIT: 01/19/2024  OBJECTIVE:  Note: Objective measures were completed at Evaluation unless otherwise noted.  DIAGNOSTIC FINDINGS: IMPRESSION: 1. Progressive fluid signal tracking between the gluteus minimus and within and along the gluteus medius muscle extending down to moderate gluteus medius tendinopathy and peritendinitis. Mild to moderate left gluteus minimus tendinopathy distally. 2. Trace left trochanteric bursitis. 3. Moderate degenerative hip arthropathy bilaterally. 4. Proximal hamstring tendinopathy and likely partial tearing similar to prior. 5. Substantial loss of intervertebral disc height at L4-5 and L5-S1 with type 2 degenerative endplate findings eccentric to the right at L4-5. 6. Intramural uterine fibroids. 7. Sigmoid colon diverticulosis.  PATIENT SURVEYS:  25/80  COGNITION: Overall cognitive status: Within functional limits for tasks assessed     SENSATION: WFL  EDEMA:  Localized swelling from surgery.   POSTURE: No Significant postural limitations  PALPATION: Tenderness to palpation at surgical site.   LOWER EXTREMITY ROM:  Passive ROM Right eval Left eval  Hip flexion    Hip extension    Hip abduction    Hip adduction    Hip internal rotation    Hip external rotation    Knee flexion    Knee extension    Ankle dorsiflexion    Ankle plantarflexion    Ankle inversion    Ankle eversion     (Blank rows = not tested)  LOWER  EXTREMITY MMT:  MMT Right eval Left eval  Hip flexion    Hip extension    Hip abduction    Hip adduction    Hip internal rotation    Hip external rotation    Knee flexion    Knee extension    Ankle dorsiflexion    Ankle plantarflexion    Ankle inversion    Ankle eversion     (Blank rows = not tested)   GAIT: Distance walked: 43ft  Assistive device utilized: Walker - 2 wheeled Level of assistance: Complete Independence Comments: Slow, antalgic gait with PWB. (30% WB per pt).  TREATMENT DATE:   02/10/24 Pt received L hip PROM in flexion and abd per protocol ranges w/n pt and tissue tolerance  Attempted upright bike though stopped due to pain Hooklying adductor sqz 5 2x10 Q-ped rocking 2x10 Seated submax hip flexion isometric with 5 sec hold x 10 reps LAQ  1.5#  2x10, 1x5 Supine bridge 2x10 Standing TKE with YTB 2x10  Updated HEP and gave pt a HEP handout.  PT educated pt in correct form and appropriate frequency.      02/08/24 Hooklying adductor sqz 5 2x10 Q-ped rocking 2x10 Seated submax hip flexion isometric with 5 sec hold x 10 reps LAQ  1.5#  2x10, 1x5 Supine bridge x10  Pt received L hip PROM in flexion and abd per protocol ranges w/n pt and tissue tolerance  02/03/24 PROM L hip Hooklying adductor sqz 5 2x10 Hooklying hip flexor isoemtric 5 x10 Hooklying hip abduction isometric 5 x10 Q-ped weight shifting x10ea LAQ 1.5# 5 2x10 Standing HR x20    02/01/24 Reviewed pt presentation, response to prior treatment, pain level, and HEP compliance.   Pt received R hip PROM in flexion and abd per protocol ranges w/n pt and tissue tolerance  Hooklying adductor squeze 5 2x10 PPT 2x10 LAQ 3x10 Q-ped rocking to 90 deg 2x10  01/28/24: Reviewed pt presentation, response to prior treatment, pain level, and HEP compliance.   Pt  received L hip PROM in flexion and abd per protocol ranges w/n pt and tissue tolerance Roller to L quads   Hooklying adductor squeeze 5 2x10 PPT 2x10 3 hold LAQ 2 x 10  01/26/24: Reviewed pt presentation, response to prior treatment, pain level, and HEP compliance.   Pt received R hip PROM in flexion and abd per protocol ranges w/n pt and tissue tolerance  Hooklying adductor squeze 5 2x10 SAQ 2x10 PPT 3x10 Quad sets with 5 sec hold x10 reps       PATIENT EDUCATION:  Education details: relevant anatomy, exercise form, diagnosis, post op limitations and restrictions, HEP,  and POC. Person educated: Patient Education method: Medical illustrator, verbal cues Education comprehension: verbalized understanding, returned demonstration, verbal cues required, and tactile cues required  HOME EXERCISE PROGRAM: Access Code: [email or web address] URL: [email or web address] Date: 01/08/2024 Prepared by: Rojean Batten  Exercises - Supine Posterior Pelvic Tilt  - 2 x daily - 7 x weekly - 1-2 sets - 10 reps - 5 hold - Seated Hip Adduction Isometrics with Ball  - 2 x daily - 7 x weekly - 1-2 sets - 10 reps - 5 hold - Supine Short Arc Quad  - 2 x daily - 7 x weekly - 1-2 sets - 10 reps - 5 hold  Updated HEP: - Supine Bridge  - 1 x daily - 5-7 x weekly - 2 sets - 10 reps - Seated Long Arc Quad  - 1 x daily - 7 x weekly - 2 sets - 10 reps   ASSESSMENT:  CLINICAL IMPRESSION:  Pt is progressing with protocol having improved tolerance with exercises.  Pt has decreased height with bridges though no pain with bridging.  Attempted upright bike though stopped due to pain.  She performed exercises per protocol well with cuing and instruction in correct form.  PT updated HEP and gave pt a HEP handout.  Pt demonstrates good understanding of HEP.  Pt tolerated PROM per protocol well.  Pt responded well to treatment stating she felt better after treatment than before treatment.  She reports  improved pain from  4-5/10 before Rx to 3/10 after Rx.  She should benefit from cont skilled PT per protocol to address impairments and goals and to improve overall function.     OBJECTIVE IMPAIRMENTS: decreased activity tolerance, difficulty walking, decreased balance, decreased endurance, decreased mobility, decreased ROM, decreased strength, impaired flexibility, impaired UE/LE use, postural dysfunction, and pain.  ACTIVITY LIMITATIONS: bending, lifting, carry, locomotion, cleaning, community activity, driving, and or occupation  PERSONAL FACTORS: Age, second surgical procedure to same location, past medical history are also affecting patient's functional outcome.  REHAB POTENTIAL: Good  CLINICAL DECISION MAKING: Stable/uncomplicated  EVALUATION COMPLEXITY: Low    SHORT TERM GOALS: 01/28/24  Pt will be independent and compliant with HEP for improved pain, strength, and function. Baseline: Goal status: MET 9/3 Target date:      2.  Pt will demo improved quality of gait with increased stance time and toe off on L.   Baseline:  Goal status: IN PROGRESS 9/3 Target date:     3.  Pt will progress with exercises per protocol without adverse effects for improved strength and mobility.   Baseline:  Goal status: INITIAL Target date:       LONG TERM GOALS: Target date: 03/24/24     Pt will be able to ambulate extended community distance without significant difficulty and pain.   Baseline:  Goal status: INITIAL   2.  Pt will be able to perform her ADLs and IADLs including household chores without significant difficulty and pain. Baseline:  Goal status: INITIAL   3.  Pt will be able to take care of her husband without significant limitations.   Baseline:  Goal status: INITIAL   4.  Pt will be able to perform mini squats with good form and without increased pain in order for improved functional LE strength and to assist with returning to gardening activities.   Baseline:   Goal status: INITIAL  Pt will report improved tolerance with standing activities and ambulation.  Baseline:  Goal status: INITIAL Target date:       5.  Pt will ambulate with a normalized heel to toe gait without limping.  Baseline:  Goal status: INITIAL Target date:      6.  Pt will be able to perform a 6 inch step up with good form and control with L LE leading for improved performance of stairs abd and improved functional strength. Baseline:  Goal status: INITIAL Target date:       PLAN:   PT FREQUENCY: 1x/week x 2-3 weeks and 2x/wk afterwards   PT DURATION: 12-14 weeks  PLANNED INTERVENTIONS (unless contraindicated): aquatic PT, Canalith repositioning, cryotherapy, Electrical stimulation, Iontophoresis with 4 mg/ml dexamethasome, Moist heat, traction, Ultrasound, gait training, Therapeutic exercise, balance training, neuromuscular re-education, patient/family education, prosthetic training, manual techniques, passive ROM, dry needling, taping, vasopnuematic device, vestibular, spinal manipulations, joint manipulations  PLAN FOR NEXT SESSION:  Cont per Dr. Danetta Patch med repair protocol.  PN next visit.    Leigh Minerva III PT, DPT 02/11/24 4:28 PM

## 2024-02-11 ENCOUNTER — Encounter (HOSPITAL_BASED_OUTPATIENT_CLINIC_OR_DEPARTMENT_OTHER): Payer: Self-pay | Admitting: Physical Therapy

## 2024-02-15 ENCOUNTER — Encounter (HOSPITAL_BASED_OUTPATIENT_CLINIC_OR_DEPARTMENT_OTHER)

## 2024-02-16 ENCOUNTER — Other Ambulatory Visit (HOSPITAL_BASED_OUTPATIENT_CLINIC_OR_DEPARTMENT_OTHER): Payer: Self-pay

## 2024-02-16 ENCOUNTER — Ambulatory Visit (INDEPENDENT_AMBULATORY_CARE_PROVIDER_SITE_OTHER): Admitting: Orthopaedic Surgery

## 2024-02-16 DIAGNOSIS — M67952 Unspecified disorder of synovium and tendon, left thigh: Secondary | ICD-10-CM | POA: Diagnosis not present

## 2024-02-16 DIAGNOSIS — M67959 Unspecified disorder of synovium and tendon, unspecified thigh: Secondary | ICD-10-CM

## 2024-02-16 MED ORDER — LIDOCAINE HCL 1 % IJ SOLN
4.0000 mL | INTRAMUSCULAR | Status: AC | PRN
  Administered 2024-02-16: 4 mL

## 2024-02-16 NOTE — Progress Notes (Signed)
 Post Operative Evaluation    Procedure/Date of Surgery: Left hip gluteus maximus tendon transfer 8/19  Interval History:   Presents 6 weeks status post the above procedure.  At this time she is still having some lateral based pain and her back has been hurting her as well.  She is continuing to progress through physical therapy.  Her gait is coming along nicely   PMH/PSH/Family History/Social History/Meds/Allergies:    Past Medical History:  Diagnosis Date  . Allergy   . Anxiety   . Arthritis   . Bursitis    left thigh  . GERD (gastroesophageal reflux disease)   . History of kidney stones   . Hypertension   . 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 Umbilical hernia    Past Surgical History:  Procedure Laterality Date  . BOWEL RESECTION  07/09/2013   Procedure: SMALL BOWEL RESECTION;  Surgeon: Krystal CHRISTELLA Spinner, MD;  Location: [facility];  Service: General;;  . RODDIE NEVIN REPAIR Left 03/22/2023   Procedure: LEFT GLUTEUS MEDIUS REPAIR WITH POSSIBLE COLLAGEN PATCH AUGMENTATION;  Surgeon: Genelle Standing, MD;  Location: [facility];  Service: Orthopedics;  Laterality: Left;  . GLUTEUS MINIMUS REPAIR Left 01/04/2024   Procedure: REPAIR, TENDON, GLUTEUS MINIMUS;  Surgeon: Genelle Standing, MD;  Location: [facility];  Service: Orthopedics;  Laterality: Left;  LEFT HIP GLUTEUS MAXIMUS TENDON TRANSFER WITH COLLAGEN PATCH AUGMENTATION  . LAPAROTOMY N/A 07/09/2013   Procedure: EXPLORATORY LAPAROTOMY;  Surgeon: Krystal CHRISTELLA Spinner, MD;  Location: [facility];  Service: General;  Laterality: N/A;  . SHOULDER ARTHROSCOPY WITH CAPSULORRHAPHY     Social History   Socioeconomic History  . Marital status: Married    Spouse name: Not on file  . Number of children: 0  . Years of education: Not on file  . Highest education level: Not on file  Occupational History  . Occupation: Retired    Tobacco Use  . Smoking status: Former    Types: Cigarettes  . Smokeless tobacco: Never  Vaping Use  . Vaping status: Never Used  Substance and Sexual Activity  . Alcohol use: Yes    Alcohol/week: 7.0 standard drinks of alcohol    Types: 7 Glasses of wine per week    Comment: nightly glass of wine  . Drug use: No  . Sexual activity: Not Currently  Other Topics Concern  . Not on file  Social History Narrative   Enjoys gardening    Social Drivers of Health   Financial Resource Strain: Low Risk  (07/22/2023)   Overall Financial Resource Strain (CARDIA)   . Difficulty of Paying Living Expenses: Not hard at all  Food Insecurity: No Food Insecurity (07/22/2023)   Hunger Vital Sign   . Worried About Programme researcher, broadcasting/film/video in the Last Year: Never true   . Ran Out of Food in the Last Year: Never true  Transportation Needs: No Transportation Needs (07/22/2023)   PRAPARE - Transportation   . Lack of Transportation (Medical): No   . Lack of Transportation (Non-Medical): No  Physical Activity: Sufficiently Active (07/22/2023)   Exercise Vital Sign   . Days of Exercise per Week: 5 days   . Minutes of Exercise per Session: 120 min  Stress: No Stress Concern Present (07/22/2023)  [employer] of Occupational Health - Occupational Stress Questionnaire   . Feeling of Stress : Not at all  Social Connections: Moderately Integrated (07/22/2023)   Social Connection and Isolation Panel   . Frequency of Communication with Friends and Family: Once a week   . Frequency of Social Gatherings with Friends and Family: More than three times a week   . Attends Religious Services: More than 4 times per year   . Active Member of Clubs or Organizations: No   . Attends Banker Meetings: Never   . Marital Status: Married   Family History  Problem Relation Age of Onset  . Diabetes Mother   . Heart attack Mother   . CAD Father   . Heart attack Father   . Breast cancer Neg Hx   . Colon cancer Neg  Hx   . Esophageal cancer Neg Hx   . Rectal cancer Neg Hx   . Stomach cancer Neg Hx    Allergies  Allergen Reactions  . Lactose Intolerance (Gi) Diarrhea  . Sulfa Antibiotics Nausea And Vomiting and Swelling   Current Outpatient Medications  Medication Sig Dispense Refill  . aspirin  EC 325 MG tablet Take 1 tablet (325 mg total) by mouth daily. 14 tablet 0  . Calcium  Carbonate Antacid (TUMS PO) Take 1 tablet by mouth as needed (reflux).    . Carboxymethylcellulose Sodium (EYE DROPS OP) Place 2 drops into both eyes daily. Thera Tears    . furosemide  (LASIX ) 40 MG tablet Take 1 tablet (40 mg total) by mouth daily. 90 tablet 0  . hydrochlorothiazide  (MICROZIDE ) 12.5 MG capsule TAKE 1 CAPSULE(12.5 MG) BY MOUTH DAILY 90 capsule 0  . losartan  (COZAAR ) 50 MG tablet TAKE 1 TABLET(50 MG) BY MOUTH DAILY 90 tablet 0  . meloxicam  (MOBIC ) 15 MG tablet Take 1 tablet (15 mg total) by mouth daily. (Patient not taking: Reported on 02/01/2024) 14 tablet 0  . pantoprazole  (PROTONIX ) 40 MG tablet TAKE 1 TABLET(40 MG) BY MOUTH DAILY 90 tablet 0   No current facility-administered medications for this visit.   No results found.  Review of Systems:   A ROS was performed including pertinent positives and negatives as documented in the HPI.   Musculoskeletal Exam:    There were no vitals taken for this visit.  Left hip incision is well-appearing without erythema or drainage.  There is no redness or erythema.  Walks with mildly antalgic gait distal exam is intact  Imaging:      I personally reviewed and interpreted the radiographs.   Assessment:   Status post left hip gluteus maximus tendon transfer overall doing well.  She is having some lateral trochanteric based pain which is inhibiting her ability rehab.  At this time I did recommend a Toradol  injection to hopefully get her some relief and I will plan to see her back in 6 weeks for reassessment Plan :    - Return to clinic 6 weeks for  reassessment    Procedure Note  Patient: Ellen [name]             Date of Birth: 1940-09-13           MRN: [id number]             Visit Date: 02/16/2024  Procedures: Visit Diagnoses:  1. Tendinopathy of gluteus medius     Large Joint Inj: L greater trochanter on 02/16/2024 10:58 AM Indications: pain Details: 22 G 3.5 in needle, ultrasound-guided anterolateral approach  Arthrogram: No  Medications: 4 mL lidocaine  1 % Outcome: tolerated well, no immediate complications Procedure, treatment alternatives, risks and benefits explained, specific risks discussed. Consent was given by the patient. Immediately prior to procedure a time out was called to verify the correct patient, procedure, equipment, support staff and site/side marked as required. Patient was prepped and draped in the usual sterile fashion.          I personally saw and evaluated the patient, and participated in the management and treatment plan.  Elspeth Parker, MD Attending Physician, Orthopedic Surgery  This document was dictated using Dragon voice recognition software. A reasonable attempt at proof reading has been made to minimize errors.

## 2024-02-17 ENCOUNTER — Encounter (HOSPITAL_BASED_OUTPATIENT_CLINIC_OR_DEPARTMENT_OTHER): Admitting: Physical Therapy

## 2024-02-18 ENCOUNTER — Ambulatory Visit
Admission: RE | Admit: 2024-02-18 | Discharge: 2024-02-18 | Disposition: A | Discharge status: Home / Self Care | Source: Ambulatory Visit | Attending: Family Medicine | Admitting: Family Medicine

## 2024-02-18 DIAGNOSIS — Z1231 Encounter for screening mammogram for malignant neoplasm of breast: Secondary | ICD-10-CM | POA: Diagnosis not present

## 2024-02-21 ENCOUNTER — Other Ambulatory Visit: Payer: Self-pay | Admitting: Orthopaedic Surgery

## 2024-02-21 ENCOUNTER — Telehealth: Payer: Self-pay | Admitting: Orthopaedic Surgery

## 2024-02-21 MED ORDER — MELOXICAM 15 MG PO TABS: 15.0000 mg | ORAL_TABLET | Freq: Every day | ORAL | 1 refills | Status: DC

## 2024-02-21 NOTE — Telephone Encounter (Signed)
 Patient states that Dr B was suppose to send in meds for her from her visit on Oct 1. She does not know what the meds are.

## 2024-02-26 ENCOUNTER — Encounter (HOSPITAL_BASED_OUTPATIENT_CLINIC_OR_DEPARTMENT_OTHER): Payer: Self-pay | Admitting: Physical Therapy

## 2024-02-26 ENCOUNTER — Ambulatory Visit (HOSPITAL_BASED_OUTPATIENT_CLINIC_OR_DEPARTMENT_OTHER): Payer: Self-pay | Attending: Orthopaedic Surgery | Admitting: Physical Therapy

## 2024-02-26 DIAGNOSIS — M25552 Pain in left hip: Secondary | ICD-10-CM | POA: Insufficient documentation

## 2024-02-26 DIAGNOSIS — R262 Difficulty in walking, not elsewhere classified: Secondary | ICD-10-CM | POA: Insufficient documentation

## 2024-02-26 DIAGNOSIS — M6281 Muscle weakness (generalized): Secondary | ICD-10-CM | POA: Insufficient documentation

## 2024-02-26 NOTE — Therapy (Signed)
 OUTPATIENT PHYSICAL THERAPY LOWER EXTREMITY TREATMENT/ PROGRESS NOTE  Progress Note Reporting Period 01/08/24 to 02/26/24  See note below for Objective Data and Assessment of Progress/Goals.       Patient Name: Ellen [name] MRN: [id number] DOB:07/17/1940, 83 y.o., female Today's Date: 02/26/2024  END OF SESSION:  PT End of Session - 02/26/24 0919     Visit Number 10    Number of Visits 24    Date for Recertification  03/24/24    Authorization Type MCR A & B    Progress Note Due on Visit 10    PT Start Time 0825    PT Stop Time 0910    PT Time Calculation (min) 45 min    Activity Tolerance Patient tolerated treatment well    Behavior During Therapy [facility] for tasks assessed/performed                  Past Medical History:  Diagnosis Date   Allergy    Anxiety    Arthritis    Bursitis    left thigh   GERD (gastroesophageal reflux disease)    History of kidney stones    Hypertension    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   Left Buccal Cheek (Keratoacanthoma)   Umbilical hernia    Past Surgical History:  Procedure Laterality Date   BOWEL RESECTION  07/09/2013   Procedure: SMALL BOWEL RESECTION;  Surgeon: Krystal CHRISTELLA Spinner, MD;  Location: [facility];  Service: General;;   RODDIE NEVIN REPAIR Left 03/22/2023   Procedure: LEFT GLUTEUS MEDIUS REPAIR WITH POSSIBLE COLLAGEN PATCH AUGMENTATION;  Surgeon: Genelle Standing, MD;  Location: [facility];  Service: Orthopedics;  Laterality: Left;   GLUTEUS MINIMUS REPAIR Left 01/04/2024   Procedure: REPAIR, TENDON, GLUTEUS MINIMUS;  Surgeon: Genelle Standing, MD;  Location: [facility];  Service: Orthopedics;  Laterality: Left;  LEFT HIP GLUTEUS MAXIMUS TENDON TRANSFER WITH COLLAGEN PATCH AUGMENTATION   LAPAROTOMY N/A 07/09/2013   Procedure: EXPLORATORY LAPAROTOMY;  Surgeon: Krystal CHRISTELLA Spinner, MD;  Location: [facility];  Service: General;  Laterality: N/A;   SHOULDER ARTHROSCOPY WITH  CAPSULORRHAPHY     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   Hypokalemia 12/27/2022   Volume depletion 12/24/2022   Constipation 12/24/2022   Partial small bowel obstruction (HCC) 12/24/2022   Polycythemia 12/24/2022   Gastroesophageal reflux disease 08/26/2022   Bloating 08/26/2022   Small intestinal bacterial overgrowth (SIBO) 08/26/2022   Osteoarthritis 12/01/2021   Plantar wart 12/01/2021   Ventral hernia 01/01/2021   Diverticulosis 01/05/2019   Dermatitis 01/05/2019   Systolic murmur 07/25/2018   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   Hyperglycemia 10/05/2017   Leg edema 10/05/2017   Hammer toe 10/05/2017   Hot flashes due to menopause 10/05/2017   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/10/2013 07/11/2013   Hypertension 07/09/2013    PCP: [place] CHRISTELLA, MD  REFERRING PROVIDER: Genelle Standing, MD  REFERRING DIAG: Tendinopathy of gluteus medius [F32.040]   THERAPY DIAG:  Pain in left hip  Muscle weakness (generalized)  Rationale for Evaluation and Treatment: Rehabilitation  ONSET DATE: 01/04/2024  SUBJECTIVE:   SUBJECTIVE STATEMENT:  Pt is 8 weeks and 4 days post op.  I am really stiff when I wake up in the mornings. I have been in a lot of pain  in my L hip. I am doing my HEP.     PERTINENT HISTORY: L hip gluteus maximus tendon transfer and trochanteric bursectomy on 01/04/24 Glute min repair, Laparotomy, Shoulder arthroscopy with capsulorrhaphy.   PAIN:  Are you having pain? Yes: NPRS scale: 4-5/10   Pain location: L lateral hip down to knee, Pt states it hurts at the incision Pain description: Constant pain Aggravating factors: Walking, standing, sitting, any hip flexion  Relieving factors: Ice.   PRECAUTIONS: Other: gute min, fall risk.  RED FLAGS: None   WEIGHT BEARING  RESTRICTIONS: Yes WBAT  FALLS:  Has patient fallen in last 6 months? No  LIVING ENVIRONMENT: Lives with: lives with their spouse Lives in: House/apartment Stairs: Yes: Internal: 12 steps; on right going up and External: 4 steps; bilateral but cannot reach both Has following equipment at home: Single point cane, Walker - 2 wheeled, Environmental consultant - 4 wheeled, and Grab bars  OCCUPATION: Human resources officer.   PLOF: Independent  PATIENT GOALS: Pt would like to walk straight.   NEXT MD VISIT: 01/19/2024  OBJECTIVE:  Note: Objective measures were completed at Evaluation unless otherwise noted.  DIAGNOSTIC FINDINGS: IMPRESSION: 1. Progressive fluid signal tracking between the gluteus minimus and within and along the gluteus medius muscle extending down to moderate gluteus medius tendinopathy and peritendinitis. Mild to moderate left gluteus minimus tendinopathy distally. 2. Trace left trochanteric bursitis. 3. Moderate degenerative hip arthropathy bilaterally. 4. Proximal hamstring tendinopathy and likely partial tearing similar to prior. 5. Substantial loss of intervertebral disc height at L4-5 and L5-S1 with type 2 degenerative endplate findings eccentric to the right at L4-5. 6. Intramural uterine fibroids. 7. Sigmoid colon diverticulosis.  PATIENT SURVEYS:  25/80  COGNITION: Overall cognitive status: Within functional limits for tasks assessed     SENSATION: WFL  EDEMA:  Localized swelling from surgery.   POSTURE: No Significant postural limitations  PALPATION: Tenderness to palpation at surgical site.   LOWER EXTREMITY ROM:  Passive ROM Right eval Left eval 02/26/24   Hip flexion   92  Hip extension     Hip abduction     Hip adduction     Hip internal rotation     Hip external rotation     Knee flexion     Knee extension     Ankle dorsiflexion     Ankle plantarflexion     Ankle inversion     Ankle eversion      (Blank rows = not tested)  LOWER  EXTREMITY MMT:  MMT Right eval Left eval  Hip flexion    Hip extension    Hip abduction    Hip adduction    Hip internal rotation    Hip external rotation    Knee flexion    Knee extension    Ankle dorsiflexion    Ankle plantarflexion    Ankle inversion    Ankle eversion     (Blank rows = not tested)   GAIT: Distance walked: 61ft  Assistive device utilized: Walker - 2 wheeled Level of assistance: Complete Independence Comments: Slow, antalgic gait with PWB. (30% WB per pt).  TREATMENT DATE:  02/26/24 Hooklying adductor sqz 5 2x10 Modified thomas stretch with PT assist due to big stretch LAQ  1 x10  Supine SL march (bilat) with adductor sqz 1 x10  STM to quads, IT band LTR  Q-ped rocking 1x10 Quad knee extension 1x10 bilat  Nustep lvl 5, 5 min Discussed Seated PPT and marching prior to getting out of bed  02/10/24 Pt received L hip PROM in flexion and abd per protocol ranges w/n pt and tissue tolerance  Attempted upright bike though stopped due to pain Hooklying adductor sqz 5 2x10 Q-ped rocking 2x10 Seated submax hip flexion isometric with 5 sec hold x 10 reps LAQ  1.5#  2x10, 1x5 Supine bridge 2x10 Standing TKE with YTB 2x10  Updated HEP and gave pt a HEP handout.  PT educated pt in correct form and appropriate frequency.      02/08/24 Hooklying adductor sqz 5 2x10 Q-ped rocking 2x10 Seated submax hip flexion isometric with 5 sec hold x 10 reps LAQ  1.5#  2x10, 1x5 Supine bridge x10  Pt received L hip PROM in flexion and abd per protocol ranges w/n pt and tissue tolerance  02/03/24 PROM L hip Hooklying adductor sqz 5 2x10 Hooklying hip flexor isoemtric 5 x10 Hooklying hip abduction isometric 5 x10 Q-ped weight shifting x10ea LAQ 1.5# 5 2x10 Standing HR x20    02/01/24 Reviewed pt presentation, response to  prior treatment, pain level, and HEP compliance.   Pt received R hip PROM in flexion and abd per protocol ranges w/n pt and tissue tolerance  Hooklying adductor squeze 5 2x10 PPT 2x10 LAQ 3x10 Q-ped rocking to 90 deg 2x10  01/28/24: Reviewed pt presentation, response to prior treatment, pain level, and HEP compliance.   Pt received L hip PROM in flexion and abd per protocol ranges w/n pt and tissue tolerance Roller to L quads   Hooklying adductor squeeze 5 2x10 PPT 2x10 3 hold LAQ 2 x 10  01/26/24: Reviewed pt presentation, response to prior treatment, pain level, and HEP compliance.   Pt received R hip PROM in flexion and abd per protocol ranges w/n pt and tissue tolerance  Hooklying adductor squeze 5 2x10 SAQ 2x10 PPT 3x10 Quad sets with 5 sec hold x10 reps       PATIENT EDUCATION:  Education details: relevant anatomy, exercise form, diagnosis, post op limitations and restrictions, HEP,  and POC. Person educated: Patient Education method: Medical illustrator, verbal cues Education comprehension: verbalized understanding, returned demonstration, verbal cues required, and tactile cues required  HOME EXERCISE PROGRAM: Access Code: [email or web address] URL: [email or web address] Date: 01/08/2024 Prepared by: Rojean Batten  Exercises - Supine Posterior Pelvic Tilt  - 2 x daily - 7 x weekly - 1-2 sets - 10 reps - 5 hold - Seated Hip Adduction Isometrics with Ball  - 2 x daily - 7 x weekly - 1-2 sets - 10 reps - 5 hold - Supine Short Arc Quad  - 2 x daily - 7 x weekly - 1-2 sets - 10 reps - 5 hold  Updated HEP: - Supine Bridge  - 1 x daily - 5-7 x weekly - 2 sets - 10 reps - Seated Long Arc Quad  - 1 x daily - 7 x weekly - 2 sets - 10 reps   ASSESSMENT:  CLINICAL IMPRESSION:  Pt is progressing with protocol having improved tolerance with exercises. Exercises modified as needed due to pain reported in L hip. She has tension noted throughout L hip  with  greater tension in TFL and hip flexor today. Encouraged continued movement and mobility of lower back and hips to help minimize tension and stiffness. Pt also encouraged to take a warm bath with espom salt. She is now able to walk without a FWW and is using a SPC for longer distances. Her mobility is improving, but is limited by pain and tension at surgical site. Pt will continue to benefit from skilled PT to address continued deficits.      Attempted upright bike though stopped due to pain.  She performed exercises per protocol well with cuing and instruction in correct form.  PT updated HEP and gave pt a HEP handout.  Pt demonstrates good understanding of HEP.  Pt tolerated PROM per protocol well.  Pt responded well to treatment stating she felt better after treatment than before treatment.  She reports improved pain from 4-5/10 before Rx to 3/10 after Rx.  She should benefit from cont skilled PT per protocol to address impairments and goals and to improve overall function.     OBJECTIVE IMPAIRMENTS: decreased activity tolerance, difficulty walking, decreased balance, decreased endurance, decreased mobility, decreased ROM, decreased strength, impaired flexibility, impaired UE/LE use, postural dysfunction, and pain.  ACTIVITY LIMITATIONS: bending, lifting, carry, locomotion, cleaning, community activity, driving, and or occupation  PERSONAL FACTORS: Age, second surgical procedure to same location, past medical history are also affecting patient's functional outcome.  REHAB POTENTIAL: Good  CLINICAL DECISION MAKING: Stable/uncomplicated  EVALUATION COMPLEXITY: Low    SHORT TERM GOALS: 01/28/24  Pt will be independent and compliant with HEP for improved pain, strength, and function. Baseline: Goal status: MET 9/3 Target date:      2.  Pt will demo improved quality of gait with increased stance time and toe off on L.   Baseline:  Goal status: IN PROGRESS 9/3 Target date:     3.  Pt  will progress with exercises per protocol without adverse effects for improved strength and mobility.   Baseline:  Goal status: INITIAL Target date:       LONG TERM GOALS: Target date: 03/24/24     Pt will be able to ambulate extended community distance without significant difficulty and pain.   Baseline:  Goal status: Ongoing 02/26/24   2.  Pt will be able to perform her ADLs and IADLs including household chores without significant difficulty and pain. Baseline:  Goal status: Ongoing 02/26/24   3.  Pt will be able to take care of her husband without significant limitations.   Baseline:  Goal status: Ongoing 02/26/24   4.  Pt will be able to perform mini squats with good form and without increased pain in order for improved functional LE strength and to assist with returning to gardening activities.   Baseline:  Goal status: INITIAL  Pt will report improved tolerance with standing activities and ambulation.  Baseline:  Goal status: INITIAL Target date:       5.  Pt will ambulate with a normalized heel to toe gait without limping.  Baseline:  Goal status: INITIAL Target date:      6.  Pt will be able to perform a 6 inch step up with good form and control with L LE leading for improved performance of stairs abd and improved functional strength. Baseline:  Goal status: INITIAL Target date:       PLAN:   PT FREQUENCY: 1x/week x 2-3 weeks and 2x/wk afterwards   PT DURATION: 12-14 weeks  PLANNED INTERVENTIONS (unless  contraindicated): aquatic PT, Canalith repositioning, cryotherapy, Electrical stimulation, Iontophoresis with 4 mg/ml dexamethasome, Moist heat, traction, Ultrasound, gait training, Therapeutic exercise, balance training, neuromuscular re-education, patient/family education, prosthetic training, manual techniques, passive ROM, dry needling, taping, vasopnuematic device, vestibular, spinal manipulations, joint manipulations  PLAN FOR NEXT SESSION:  Cont per Dr.  Danetta Patch med repair protocol.  PN next visit.    Rojean Batten PT, DPT 02/26/24  12:17 PM

## 2024-03-03 ENCOUNTER — Encounter (HOSPITAL_BASED_OUTPATIENT_CLINIC_OR_DEPARTMENT_OTHER): Payer: Self-pay | Admitting: Physical Therapy

## 2024-03-03 ENCOUNTER — Ambulatory Visit (HOSPITAL_BASED_OUTPATIENT_CLINIC_OR_DEPARTMENT_OTHER): Payer: Self-pay | Admitting: Physical Therapy

## 2024-03-03 DIAGNOSIS — M6281 Muscle weakness (generalized): Secondary | ICD-10-CM | POA: Diagnosis not present

## 2024-03-03 DIAGNOSIS — M25552 Pain in left hip: Secondary | ICD-10-CM

## 2024-03-03 DIAGNOSIS — R262 Difficulty in walking, not elsewhere classified: Secondary | ICD-10-CM

## 2024-03-03 NOTE — Therapy (Signed)
 OUTPATIENT PHYSICAL THERAPY LOWER EXTREMITY TREATMENT      Patient Name: Ellen [name] MRN: [id number] DOB:[birth year], 83 y.o., female Today's Date: 03/03/2024  END OF SESSION:  PT End of Session - 03/03/24 0821     Visit Number 11    Number of Visits 24    Date for Recertification  04/28/24    Authorization Type MCR A & B    PT Start Time 0808    PT Stop Time 0853    PT Time Calculation (min) 45 min    Activity Tolerance Patient tolerated treatment well    Behavior During Therapy [facility] for tasks assessed/performed                   Past Medical History:  Diagnosis Date   Allergy    Anxiety    Arthritis    Bursitis    left thigh   GERD (gastroesophageal reflux disease)    History of kidney stones    Hypertension    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   Left Buccal Cheek (Keratoacanthoma)   Umbilical hernia    Past Surgical History:  Procedure Laterality Date   BOWEL RESECTION  07/09/2013   Procedure: SMALL BOWEL RESECTION;  Surgeon: Krystal CHRISTELLA Spinner, MD;  Location: [facility];  Service: General;;   RODDIE NEVIN REPAIR Left 03/22/2023   Procedure: LEFT GLUTEUS MEDIUS REPAIR WITH POSSIBLE COLLAGEN PATCH AUGMENTATION;  Surgeon: Genelle Standing, MD;  Location: [facility];  Service: Orthopedics;  Laterality: Left;   GLUTEUS MINIMUS REPAIR Left 01/04/2024   Procedure: REPAIR, TENDON, GLUTEUS MINIMUS;  Surgeon: Genelle Standing, MD;  Location: [facility];  Service: Orthopedics;  Laterality: Left;  LEFT HIP GLUTEUS MAXIMUS TENDON TRANSFER WITH COLLAGEN PATCH AUGMENTATION   LAPAROTOMY N/A 07/09/2013   Procedure: EXPLORATORY LAPAROTOMY;  Surgeon: Krystal CHRISTELLA Spinner, MD;  Location: [facility];  Service: General;  Laterality: N/A;   SHOULDER ARTHROSCOPY WITH CAPSULORRHAPHY     Patient Active Problem List   Diagnosis Date Noted   B12 deficiency 07/30/2023   Skin ulcer of ankle, limited to breakdown of skin (HCC) 04/28/2023    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   Hypokalemia 12/27/2022   Volume depletion 12/24/2022   Constipation 12/24/2022   Partial small bowel obstruction (HCC) 12/24/2022   Polycythemia 12/24/2022   Gastroesophageal reflux disease 08/26/2022   Bloating 08/26/2022   Small intestinal bacterial overgrowth (SIBO) 08/26/2022   Osteoarthritis 12/01/2021   Plantar wart 12/01/2021   Ventral hernia 01/01/2021   Diverticulosis 01/05/2019   Dermatitis 01/05/2019   Systolic murmur 07/25/2018   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   Hyperglycemia 10/05/2017   Leg edema 10/05/2017   Hammer toe 10/05/2017   Hot flashes due to menopause 10/05/2017   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/10/2013 07/11/2013   Hypertension 07/09/2013    PCP: [place] CHRISTELLA, MD  REFERRING PROVIDER: Genelle Standing, MD  REFERRING DIAG: Tendinopathy of gluteus medius [F32.040]   THERAPY DIAG:  Pain in left hip  Muscle weakness (generalized)  Difficulty in walking, not elsewhere classified  Rationale for Evaluation and Treatment: Rehabilitation  ONSET DATE: 01/04/2024  SUBJECTIVE:   SUBJECTIVE STATEMENT:  Pt states she felt good after prior treatment.  I think there is some improvement.  I feel stronger.  Still not walking right.  Pt reports compliance with HEP.  Pt states the one exercise that really makes a difference is q-ped rocking.  Sometimes her pain is a little higher  in the groin.  She is not sure if she is overdoing something.  Pt states she mowed her yard yesterday with a self propelled mower.  She had pain while mowing and after mowing.     PERTINENT HISTORY: L hip gluteus maximus tendon transfer and trochanteric bursectomy on 01/04/24 Glute min repair, Laparotomy, Shoulder arthroscopy with capsulorrhaphy.   PAIN:  Are you having pain? Yes: NPRS scale: 0/10   Pain location: L lateral hip down to knee, Pt states it hurts at the incision Pain description: Constant  pain Aggravating factors: Walking, standing, sitting, any hip flexion  Relieving factors: Ice.   PRECAUTIONS: Other: gute min, fall risk.  RED FLAGS: None   WEIGHT BEARING RESTRICTIONS: Yes WBAT  FALLS:  Has patient fallen in last 6 months? No  LIVING ENVIRONMENT: Lives with: lives with their spouse Lives in: House/apartment Stairs: Yes: Internal: 12 steps; on right going up and External: 4 steps; bilateral but cannot reach both Has following equipment at home: Single point cane, Walker - 2 wheeled, Environmental consultant - 4 wheeled, and Grab bars  OCCUPATION: Human resources officer.   PLOF: Independent  PATIENT GOALS: Pt would like to walk straight.   NEXT MD VISIT: 01/19/2024  OBJECTIVE:  Note: Objective measures were completed at Evaluation unless otherwise noted.  DIAGNOSTIC FINDINGS: IMPRESSION: 1. Progressive fluid signal tracking between the gluteus minimus and within and along the gluteus medius muscle extending down to moderate gluteus medius tendinopathy and peritendinitis. Mild to moderate left gluteus minimus tendinopathy distally. 2. Trace left trochanteric bursitis. 3. Moderate degenerative hip arthropathy bilaterally. 4. Proximal hamstring tendinopathy and likely partial tearing similar to prior. 5. Substantial loss of intervertebral disc height at L4-5 and L5-S1 with type 2 degenerative endplate findings eccentric to the right at L4-5. 6. Intramural uterine fibroids. 7. Sigmoid colon diverticulosis.  PATIENT SURVEYS:  25/80  COGNITION: Overall cognitive status: Within functional limits for tasks assessed     SENSATION: WFL  EDEMA:  Localized swelling from surgery.   POSTURE: No Significant postural limitations  PALPATION: Tenderness to palpation at surgical site.   LOWER EXTREMITY ROM:  Passive ROM Right eval Left eval 02/26/24   Hip flexion   92  Hip extension     Hip abduction     Hip adduction     Hip internal rotation     Hip external  rotation     Knee flexion     Knee extension     Ankle dorsiflexion     Ankle plantarflexion     Ankle inversion     Ankle eversion      (Blank rows = not tested)  LOWER EXTREMITY MMT:  MMT Right eval Left eval  Hip flexion    Hip extension    Hip abduction    Hip adduction    Hip internal rotation    Hip external rotation    Knee flexion    Knee extension    Ankle dorsiflexion    Ankle plantarflexion    Ankle inversion    Ankle eversion     (Blank rows = not tested)   GAIT: Distance walked: 11ft  Assistive device utilized: Walker - 2 wheeled Level of assistance: Complete Independence Comments: Slow, antalgic gait with PWB. (30% WB per pt).  TREATMENT DATE:  03/03/24 Reviewed pain level, response to prior treatment, HEP compliance, and current function.  Nustep lvl 4 x 5 mins UE/LE's  Gait:  Pt ambulates with SPC.  She has slow gait speed with rightward lean.  Pt favors L LE with increased Wb'ing on R LE.  Pt received L hip PROM in flexion, abd, ER, and IR per protocol ranges w/n pt and tissue tolerance L hip PROM:   Flexion:  100 deg Abd:  15 IR:  14 deg  Q-ped rocking x 10 reps Stool rotations x 10 each Supine bridges 2x10  LEFS:  45/80    02/26/24 Hooklying adductor sqz 5 2x10 Modified thomas stretch with PT assist due to big stretch LAQ  1 x10  Supine SL march (bilat) with adductor sqz 1 x10  STM to quads, IT band LTR  Q-ped rocking 1x10 Quad knee extension 1x10 bilat  Nustep lvl 5, 5 min Discussed Seated PPT and marching prior to getting out of bed  02/10/24 Pt received L hip PROM in flexion and abd per protocol ranges w/n pt and tissue tolerance  Attempted upright bike though stopped due to pain Hooklying adductor sqz 5 2x10 Q-ped rocking 2x10 Seated submax hip flexion isometric with 5 sec hold x 10  reps LAQ  1.5#  2x10, 1x5 Supine bridge 2x10 Standing TKE with YTB 2x10  Updated HEP and gave pt a HEP handout.  PT educated pt in correct form and appropriate frequency.      02/08/24 Hooklying adductor sqz 5 2x10 Q-ped rocking 2x10 Seated submax hip flexion isometric with 5 sec hold x 10 reps LAQ  1.5#  2x10, 1x5 Supine bridge x10  Pt received L hip PROM in flexion and abd per protocol ranges w/n pt and tissue tolerance  02/03/24 PROM L hip Hooklying adductor sqz 5 2x10 Hooklying hip flexor isoemtric 5 x10 Hooklying hip abduction isometric 5 x10 Q-ped weight shifting x10ea LAQ 1.5# 5 2x10 Standing HR x20    02/01/24 Reviewed pt presentation, response to prior treatment, pain level, and HEP compliance.   Pt received R hip PROM in flexion and abd per protocol ranges w/n pt and tissue tolerance  Hooklying adductor squeze 5 2x10 PPT 2x10 LAQ 3x10 Q-ped rocking to 90 deg 2x10  01/28/24: Reviewed pt presentation, response to prior treatment, pain level, and HEP compliance.   Pt received L hip PROM in flexion and abd per protocol ranges w/n pt and tissue tolerance Roller to L quads   Hooklying adductor squeeze 5 2x10 PPT 2x10 3 hold LAQ 2 x 10  01/26/24: Reviewed pt presentation, response to prior treatment, pain level, and HEP compliance.   Pt received R hip PROM in flexion and abd per protocol ranges w/n pt and tissue tolerance  Hooklying adductor squeze 5 2x10 SAQ 2x10 PPT 3x10 Quad sets with 5 sec hold x10 reps       PATIENT EDUCATION:  Education details: relevant anatomy, exercise form, diagnosis, post op limitations and restrictions, HEP,  and POC. Person educated: Patient Education method: Medical illustrator, verbal cues Education comprehension: verbalized understanding, returned demonstration, verbal cues required, and tactile cues required  HOME EXERCISE PROGRAM: Access Code: [email or web address] URL:  [email or web address] Date: 01/08/2024 Prepared by: Rojean Batten  Exercises - Supine Posterior Pelvic Tilt  - 2 x daily - 7 x weekly - 1-2 sets - 10 reps - 5 hold - Seated Hip Adduction Isometrics with Ball  - 2 x daily - 7 x weekly - 1-2  sets - 10 reps - 5 hold - Supine Short Arc Quad  - 2 x daily - 7 x weekly - 1-2 sets - 10 reps - 5 hold  Updated HEP: - Supine Bridge  - 1 x daily - 5-7 x weekly - 2 sets - 10 reps - Seated Long Arc Quad  - 1 x daily - 7 x weekly - 2 sets - 10 reps   ASSESSMENT:  CLINICAL IMPRESSION:  Pt  is 8 weeks and 3 days post op.  Pt reports continued difficulty with gait though states she is stronger.  Pt ambulates with a cane and continues to have gait deficits.  She continues to be active and mowed her lawn yesterday.  PT performed PROM per protocol ranges w/n pt and tissue tolerance and she tolerated PROM well.  Pt demonstrates improved hip flexion PROM today as evidenced by goniometric measurement.  Pt performed exercises well with cuing and instruction for correct form.  Pt demonstrates clinically significant improvement in self perceived disability and function with LEFS improving from 25/80 to 45/80.  Pt tolerated treatment well and reports increased pain to 2-3/10 after treatment.  Pt will continue to benefit from skilled PT to address impairments and goals and to assist in restoring desired level of function.     OBJECTIVE IMPAIRMENTS: decreased activity tolerance, difficulty walking, decreased balance, decreased endurance, decreased mobility, decreased ROM, decreased strength, impaired flexibility, impaired UE/LE use, postural dysfunction, and pain.  ACTIVITY LIMITATIONS: bending, lifting, carry, locomotion, cleaning, community activity, driving, and or occupation  PERSONAL FACTORS: Age, second surgical procedure to same location, past medical history are also affecting patient's functional outcome.  REHAB POTENTIAL: Good  CLINICAL  DECISION MAKING: Stable/uncomplicated  EVALUATION COMPLEXITY: Low    SHORT TERM GOALS: 01/28/24  Pt will be independent and compliant with HEP for improved pain, strength, and function. Baseline: Goal status: MET 9/3 Target date:      2.  Pt will demo improved quality of gait with increased stance time and toe off on L.   Baseline:  Goal status: IN PROGRESS 9/3 Target date:     3.  Pt will progress with exercises per protocol without adverse effects for improved strength and mobility.   Baseline:  Goal status: INITIAL Target date:       LONG TERM GOALS: Target date: 03/24/24     Pt will be able to ambulate extended community distance without significant difficulty and pain.   Baseline:  Goal status: Ongoing 02/26/24   2.  Pt will be able to perform her ADLs and IADLs including household chores without significant difficulty and pain. Baseline:  Goal status: Ongoing 02/26/24   3.  Pt will be able to take care of her husband without significant limitations.   Baseline:  Goal status: Ongoing 02/26/24   4.  Pt will be able to perform mini squats with good form and without increased pain in order for improved functional LE strength and to assist with returning to gardening activities.   Baseline:  Goal status: INITIAL  Pt will report improved tolerance with standing activities and ambulation.  Baseline:  Goal status: INITIAL Target date:       5.  Pt will ambulate with a normalized heel to toe gait without limping.  Baseline:  Goal status: INITIAL Target date:      6.  Pt will be able to perform a 6 inch step up with good form and control with L LE leading for improved performance of  stairs abd and improved functional strength. Baseline:  Goal status: INITIAL Target date:       PLAN:   PT FREQUENCY:  2x/wk    PT DURATION: 8 weeks  PLANNED INTERVENTIONS (unless contraindicated): aquatic PT, Canalith repositioning, cryotherapy, Electrical stimulation,  Iontophoresis with 4 mg/ml dexamethasome, Moist heat, traction, Ultrasound, gait training, Therapeutic exercise, balance training, neuromuscular re-education, patient/family education, prosthetic training, manual techniques, passive ROM, dry needling, taping, vasopnuematic device, vestibular, spinal manipulations, joint manipulations  PLAN FOR NEXT SESSION:  Cont per Dr. Danetta Patch med repair protocol.    Leigh Minerva III PT, DPT 03/03/24 6:05 PM

## 2024-03-06 NOTE — Therapy (Unsigned)
 OUTPATIENT PHYSICAL THERAPY LOWER EXTREMITY TREATMENT      Patient Name: Ellen [name] MRN: [id number] DOB:May 31, 1940, 83 y.o., female Today's Date: 03/07/2024  END OF SESSION:  PT End of Session - 03/07/24 0847     Visit Number 12    Number of Visits 24    Date for Recertification  04/28/24    Authorization Type MCR A & B    Progress Note Due on Visit 20    PT Start Time 0847    PT Stop Time 0925    PT Time Calculation (min) 38 min    Activity Tolerance Patient tolerated treatment well    Behavior During Therapy [facility] for tasks assessed/performed                    Past Medical History:  Diagnosis Date   Allergy    Anxiety    Arthritis    Bursitis    left thigh   GERD (gastroesophageal reflux disease)    History of kidney stones    Hypertension    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   Left Buccal Cheek (Keratoacanthoma)   Umbilical hernia    Past Surgical History:  Procedure Laterality Date   BOWEL RESECTION  07/09/2013   Procedure: SMALL BOWEL RESECTION;  Surgeon: Krystal CHRISTELLA Spinner, MD;  Location: [facility];  Service: General;;   RODDIE NEVIN REPAIR Left 03/22/2023   Procedure: LEFT GLUTEUS MEDIUS REPAIR WITH POSSIBLE COLLAGEN PATCH AUGMENTATION;  Surgeon: Genelle Standing, MD;  Location: [facility];  Service: Orthopedics;  Laterality: Left;   GLUTEUS MINIMUS REPAIR Left 01/04/2024   Procedure: REPAIR, TENDON, GLUTEUS MINIMUS;  Surgeon: Genelle Standing, MD;  Location: [facility];  Service: Orthopedics;  Laterality: Left;  LEFT HIP GLUTEUS MAXIMUS TENDON TRANSFER WITH COLLAGEN PATCH AUGMENTATION   LAPAROTOMY N/A 07/09/2013   Procedure: EXPLORATORY LAPAROTOMY;  Surgeon: Krystal CHRISTELLA Spinner, MD;  Location: [facility];  Service: General;  Laterality: N/A;   SHOULDER ARTHROSCOPY WITH CAPSULORRHAPHY     Patient Active Problem List   Diagnosis Date Noted   B12 deficiency 07/30/2023   Skin ulcer of ankle, limited to  breakdown of skin (HCC) 04/28/2023   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   Hypokalemia 12/27/2022   Volume depletion 12/24/2022   Constipation 12/24/2022   Partial small bowel obstruction (HCC) 12/24/2022   Polycythemia 12/24/2022   Gastroesophageal reflux disease 08/26/2022   Bloating 08/26/2022   Small intestinal bacterial overgrowth (SIBO) 08/26/2022   Osteoarthritis 12/01/2021   Plantar wart 12/01/2021   Ventral hernia 01/01/2021   Diverticulosis 01/05/2019   Dermatitis 01/05/2019   Systolic murmur 07/25/2018   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   Hyperglycemia 10/05/2017   Leg edema 10/05/2017   Hammer toe 10/05/2017   Hot flashes due to menopause 10/05/2017   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/10/2013 07/11/2013   Hypertension 07/09/2013    PCP: [place] CHRISTELLA, MD  REFERRING PROVIDER: Genelle Standing, MD  REFERRING DIAG: Tendinopathy of gluteus medius [F32.040]   THERAPY DIAG:  Pain in left hip  Muscle weakness (generalized)  Difficulty in walking, not elsewhere classified  Rationale for Evaluation and Treatment: Rehabilitation  ONSET DATE: 01/04/2024  SUBJECTIVE:   SUBJECTIVE STATEMENT:  Pt states she felt good after prior treatment.  I think there is some improvement.  I feel stronger.  Still not walking right.  Pt reports compliance with HEP.  Pt states the one exercise that really makes a difference is  q-ped rocking.  Sometimes her pain is a little higher in the groin.  She is not sure if she is overdoing something.  Pt states she mowed her yard yesterday with a self propelled mower.  She had pain while mowing and after mowing.     PERTINENT HISTORY: L hip gluteus maximus tendon transfer and trochanteric bursectomy on 01/04/24 Glute min repair, Laparotomy, Shoulder arthroscopy with capsulorrhaphy.   PAIN:  Are you having pain? Yes: NPRS scale: 0/10   Pain location: L lateral hip down to knee, Pt states it hurts at the  incision Pain description: Constant pain Aggravating factors: Walking, standing, sitting, any hip flexion  Relieving factors: Ice.   PRECAUTIONS: Other: gute min, fall risk.  RED FLAGS: None   WEIGHT BEARING RESTRICTIONS: Yes WBAT  FALLS:  Has patient fallen in last 6 months? No  LIVING ENVIRONMENT: Lives with: lives with their spouse Lives in: House/apartment Stairs: Yes: Internal: 12 steps; on right going up and External: 4 steps; bilateral but cannot reach both Has following equipment at home: Single point cane, Walker - 2 wheeled, Environmental consultant - 4 wheeled, and Grab bars  OCCUPATION: Human resources officer.   PLOF: Independent  PATIENT GOALS: Pt would like to walk straight.   NEXT MD VISIT: 01/19/2024  OBJECTIVE:  Note: Objective measures were completed at Evaluation unless otherwise noted.  DIAGNOSTIC FINDINGS: IMPRESSION: 1. Progressive fluid signal tracking between the gluteus minimus and within and along the gluteus medius muscle extending down to moderate gluteus medius tendinopathy and peritendinitis. Mild to moderate left gluteus minimus tendinopathy distally. 2. Trace left trochanteric bursitis. 3. Moderate degenerative hip arthropathy bilaterally. 4. Proximal hamstring tendinopathy and likely partial tearing similar to prior. 5. Substantial loss of intervertebral disc height at L4-5 and L5-S1 with type 2 degenerative endplate findings eccentric to the right at L4-5. 6. Intramural uterine fibroids. 7. Sigmoid colon diverticulosis.  PATIENT SURVEYS:  25/80  COGNITION: Overall cognitive status: Within functional limits for tasks assessed     SENSATION: WFL  EDEMA:  Localized swelling from surgery.   POSTURE: No Significant postural limitations  PALPATION: Tenderness to palpation at surgical site.   LOWER EXTREMITY ROM:  Passive ROM Right eval Left eval 02/26/24   Hip flexion   92  Hip extension     Hip abduction     Hip adduction     Hip  internal rotation     Hip external rotation     Knee flexion     Knee extension     Ankle dorsiflexion     Ankle plantarflexion     Ankle inversion     Ankle eversion      (Blank rows = not tested)  LOWER EXTREMITY MMT:  MMT Right eval Left eval  Hip flexion    Hip extension    Hip abduction    Hip adduction    Hip internal rotation    Hip external rotation    Knee flexion    Knee extension    Ankle dorsiflexion    Ankle plantarflexion    Ankle inversion    Ankle eversion     (Blank rows = not tested)   GAIT: Distance walked: 15ft  Assistive device utilized: Walker - 2 wheeled Level of assistance: Complete Independence Comments: Slow, antalgic gait with PWB. (30% WB per pt).  TREATMENT DATE:  03/07/24 Nustep lvl 5, 5 min UE/ LE's Sit to stand  Side stepping at countertop x 6 laps  Standing hip abduction with 2# x15 each leg  Standing hip extension with 2# x15 each leg  Marching with 2# to tolerance Step up to 4 step.   03/03/24 Reviewed pain level, response to prior treatment, HEP compliance, and current function.  Nustep lvl 4 x 5 mins UE/LE's  Gait:  Pt ambulates with SPC.  She has slow gait speed with rightward lean.  Pt favors L LE with increased Wb'ing on R LE.  Pt received L hip PROM in flexion, abd, ER, and IR per protocol ranges w/n pt and tissue tolerance L hip PROM:   Flexion:  100 deg Abd:  15 IR:  14 deg  Q-ped rocking x 10 reps Stool rotations x 10 each Supine bridges 2x10  LEFS:  45/80    02/26/24 Hooklying adductor sqz 5 2x10 Modified thomas stretch with PT assist due to big stretch LAQ  1 x10  Supine SL march (bilat) with adductor sqz 1 x10  STM to quads, IT band LTR  Q-ped rocking 1x10 Quad knee extension 1x10 bilat  Nustep lvl 5, 5 min Discussed Seated PPT and marching prior to getting out of  bed  02/10/24 Pt received L hip PROM in flexion and abd per protocol ranges w/n pt and tissue tolerance  Attempted upright bike though stopped due to pain Hooklying adductor sqz 5 2x10 Q-ped rocking 2x10 Seated submax hip flexion isometric with 5 sec hold x 10 reps LAQ  1.5#  2x10, 1x5 Supine bridge 2x10 Standing TKE with YTB 2x10  Updated HEP and gave pt a HEP handout.  PT educated pt in correct form and appropriate frequency.      02/08/24 Hooklying adductor sqz 5 2x10 Q-ped rocking 2x10 Seated submax hip flexion isometric with 5 sec hold x 10 reps LAQ  1.5#  2x10, 1x5 Supine bridge x10  Pt received L hip PROM in flexion and abd per protocol ranges w/n pt and tissue tolerance  02/03/24 PROM L hip Hooklying adductor sqz 5 2x10 Hooklying hip flexor isoemtric 5 x10 Hooklying hip abduction isometric 5 x10 Q-ped weight shifting x10ea LAQ 1.5# 5 2x10 Standing HR x20    02/01/24 Reviewed pt presentation, response to prior treatment, pain level, and HEP compliance.   Pt received R hip PROM in flexion and abd per protocol ranges w/n pt and tissue tolerance  Hooklying adductor squeze 5 2x10 PPT 2x10 LAQ 3x10 Q-ped rocking to 90 deg 2x10  01/28/24: Reviewed pt presentation, response to prior treatment, pain level, and HEP compliance.   Pt received L hip PROM in flexion and abd per protocol ranges w/n pt and tissue tolerance Roller to L quads   Hooklying adductor squeeze 5 2x10 PPT 2x10 3 hold LAQ 2 x 10  01/26/24: Reviewed pt presentation, response to prior treatment, pain level, and HEP compliance.   Pt received R hip PROM in flexion and abd per protocol ranges w/n pt and tissue tolerance  Hooklying adductor squeze 5 2x10 SAQ 2x10 PPT 3x10 Quad sets with 5 sec hold x10 reps       PATIENT EDUCATION:  Education details: relevant anatomy, exercise form, diagnosis, post op limitations and restrictions, HEP,  and POC. Person educated: Patient Education  method: Medical illustrator, verbal cues Education comprehension: verbalized understanding, returned demonstration, verbal cues required, and tactile cues required  HOME EXERCISE PROGRAM: Access Code: [email or web address] URL:  [email or web address] Date: 01/08/2024 Prepared by: Rojean Batten  Exercises - Supine Posterior Pelvic Tilt  - 2 x daily - 7 x weekly - 1-2 sets - 10 reps - 5 hold - Seated Hip Adduction Isometrics with Ball  - 2 x daily - 7 x weekly - 1-2 sets - 10 reps - 5 hold - Supine Short Arc Quad  - 2 x daily - 7 x weekly - 1-2 sets - 10 reps - 5 hold  Updated HEP: - Supine Bridge  - 1 x daily - 5-7 x weekly - 2 sets - 10 reps - Seated Long Arc Quad  - 1 x daily - 7 x weekly - 2 sets - 10 reps   ASSESSMENT:  CLINICAL IMPRESSION:  Pt tolerated session well today. Challenged her with standing exercises. She reports pain with step up's to 6 step with pain radiating across lower back. She was able to perform 4 with no difficulty with movement, but continued pain and a lateral lean towards R side. Pain reduced with LTR and quad rocking. Pt will continue to benefit from progressions in standing to increase muscle strength to tolerate stairs and other functional movements. Pt will continue to benefit from skilled PT to address continued deficits.     OBJECTIVE IMPAIRMENTS: decreased activity tolerance, difficulty walking, decreased balance, decreased endurance, decreased mobility, decreased ROM, decreased strength, impaired flexibility, impaired UE/LE use, postural dysfunction, and pain.  ACTIVITY LIMITATIONS: bending, lifting, carry, locomotion, cleaning, community activity, driving, and or occupation  PERSONAL FACTORS: Age, second surgical procedure to same location, past medical history are also affecting patient's functional outcome.  REHAB POTENTIAL: Good  CLINICAL DECISION MAKING: Stable/uncomplicated  EVALUATION COMPLEXITY: Low    SHORT TERM GOALS:  01/28/24  Pt will be independent and compliant with HEP for improved pain, strength, and function. Baseline: Goal status: MET 9/3 Target date:      2.  Pt will demo improved quality of gait with increased stance time and toe off on L.   Baseline:  Goal status: IN PROGRESS 9/3 Target date:     3.  Pt will progress with exercises per protocol without adverse effects for improved strength and mobility.   Baseline:  Goal status: INITIAL Target date:       LONG TERM GOALS: Target date: 03/24/24     Pt will be able to ambulate extended community distance without significant difficulty and pain.   Baseline:  Goal status: Ongoing 02/26/24   2.  Pt will be able to perform her ADLs and IADLs including household chores without significant difficulty and pain. Baseline:  Goal status: Ongoing 02/26/24   3.  Pt will be able to take care of her husband without significant limitations.   Baseline:  Goal status: Ongoing 02/26/24   4.  Pt will be able to perform mini squats with good form and without increased pain in order for improved functional LE strength and to assist with returning to gardening activities.   Baseline:  Goal status: INITIAL  Pt will report improved tolerance with standing activities and ambulation.  Baseline:  Goal status: INITIAL Target date:       5.  Pt will ambulate with a normalized heel to toe gait without limping.  Baseline:  Goal status: INITIAL Target date:      6.  Pt will be able to perform a 6 inch step up with good form and control with L LE leading for improved performance of stairs abd and improved functional strength. Baseline:  Goal status: INITIAL Target date:       PLAN:   PT FREQUENCY:  2x/wk    PT DURATION: 8 weeks  PLANNED INTERVENTIONS (unless contraindicated): aquatic PT, Canalith repositioning, cryotherapy, Electrical stimulation, Iontophoresis with 4 mg/ml dexamethasome, Moist heat, traction, Ultrasound, gait training,  Therapeutic exercise, balance training, neuromuscular re-education, patient/family education, prosthetic training, manual techniques, passive ROM, dry needling, taping, vasopnuematic device, vestibular, spinal manipulations, joint manipulations  PLAN FOR NEXT SESSION:  Cont per Dr. Danetta Patch med repair protocol.   Rojean Batten PT, DPT 03/07/24  9:23 AM

## 2024-03-07 ENCOUNTER — Encounter (HOSPITAL_BASED_OUTPATIENT_CLINIC_OR_DEPARTMENT_OTHER): Payer: Self-pay | Admitting: Physical Therapy

## 2024-03-07 ENCOUNTER — Ambulatory Visit (HOSPITAL_BASED_OUTPATIENT_CLINIC_OR_DEPARTMENT_OTHER): Admitting: Physical Therapy

## 2024-03-07 DIAGNOSIS — R262 Difficulty in walking, not elsewhere classified: Secondary | ICD-10-CM

## 2024-03-07 DIAGNOSIS — M25552 Pain in left hip: Secondary | ICD-10-CM

## 2024-03-07 DIAGNOSIS — M6281 Muscle weakness (generalized): Secondary | ICD-10-CM

## 2024-03-08 NOTE — Therapy (Unsigned)
 OUTPATIENT PHYSICAL THERAPY LOWER EXTREMITY TREATMENT      Patient Name: Ellen [name] MRN: [id number] DOB:[birth year], 83 y.o., female Today's Date: 03/09/2024  END OF SESSION:  PT End of Session - 03/09/24 0900     Visit Number 13    Number of Visits 24    Date for Recertification  04/28/24    Authorization Type MCR A & B    Progress Note Due on Visit 20    PT Start Time 0845    PT Stop Time 0915    PT Time Calculation (min) 30 min    Activity Tolerance Patient tolerated treatment well    Behavior During Therapy [facility] for tasks assessed/performed                     Past Medical History:  Diagnosis Date   Allergy    Anxiety    Arthritis    Bursitis    left thigh   GERD (gastroesophageal reflux disease)    History of kidney stones    Hypertension    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   Left Buccal Cheek (Keratoacanthoma)   Umbilical hernia    Past Surgical History:  Procedure Laterality Date   BOWEL RESECTION  07/09/2013   Procedure: SMALL BOWEL RESECTION;  Surgeon: Krystal CHRISTELLA Spinner, MD;  Location: [facility];  Service: General;;   RODDIE NEVIN REPAIR Left 03/22/2023   Procedure: LEFT GLUTEUS MEDIUS REPAIR WITH POSSIBLE COLLAGEN PATCH AUGMENTATION;  Surgeon: Genelle Standing, MD;  Location: [facility];  Service: Orthopedics;  Laterality: Left;   GLUTEUS MINIMUS REPAIR Left 01/04/2024   Procedure: REPAIR, TENDON, GLUTEUS MINIMUS;  Surgeon: Genelle Standing, MD;  Location: [facility];  Service: Orthopedics;  Laterality: Left;  LEFT HIP GLUTEUS MAXIMUS TENDON TRANSFER WITH COLLAGEN PATCH AUGMENTATION   LAPAROTOMY N/A 07/09/2013   Procedure: EXPLORATORY LAPAROTOMY;  Surgeon: Krystal CHRISTELLA Spinner, MD;  Location: [facility];  Service: General;  Laterality: N/A;   SHOULDER ARTHROSCOPY WITH CAPSULORRHAPHY     Patient Active Problem List   Diagnosis Date Noted   B12 deficiency 07/30/2023   Skin ulcer of ankle, limited to  breakdown of skin (HCC) 04/28/2023   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   Hypokalemia 12/27/2022   Volume depletion 12/24/2022   Constipation 12/24/2022   Partial small bowel obstruction (HCC) 12/24/2022   Polycythemia 12/24/2022   Gastroesophageal reflux disease 08/26/2022   Bloating 08/26/2022   Small intestinal bacterial overgrowth (SIBO) 08/26/2022   Osteoarthritis 12/01/2021   Plantar wart 12/01/2021   Ventral hernia 01/01/2021   Diverticulosis 01/05/2019   Dermatitis 01/05/2019   Systolic murmur 07/25/2018   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   Hyperglycemia 10/05/2017   Leg edema 10/05/2017   Hammer toe 10/05/2017   Hot flashes due to menopause 10/05/2017   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/10/2013 07/11/2013   Hypertension 07/09/2013    PCP: [place] CHRISTELLA, MD  REFERRING PROVIDER: Genelle Standing, MD  REFERRING DIAG: Tendinopathy of gluteus medius [F32.040]   THERAPY DIAG:  No diagnosis found.  Rationale for Evaluation and Treatment: Rehabilitation  ONSET DATE: 01/04/2024  SUBJECTIVE:   SUBJECTIVE STATEMENT:  I don't know what I did yesterday, but man I am hurting. I have 30 minutes as I have someone coming to put a door in.     PERTINENT HISTORY: L hip gluteus maximus tendon transfer and trochanteric bursectomy on 01/04/24 Glute min repair, Laparotomy, Shoulder arthroscopy with capsulorrhaphy.  PAIN:  Are you having pain? Yes: NPRS scale: 0/10   Pain location: L lateral hip down to knee, Pt states it hurts at the incision Pain description: Constant pain Aggravating factors: Walking, standing, sitting, any hip flexion  Relieving factors: Ice.   PRECAUTIONS: Other: gute min, fall risk.  RED FLAGS: None   WEIGHT BEARING RESTRICTIONS: Yes WBAT  FALLS:  Has patient fallen in last 6 months? No  LIVING ENVIRONMENT: Lives with: lives with their spouse Lives in: House/apartment Stairs: Yes: Internal: 12 steps; on  right going up and External: 4 steps; bilateral but cannot reach both Has following equipment at home: Single point cane, Walker - 2 wheeled, Environmental consultant - 4 wheeled, and Grab bars  OCCUPATION: Human resources officer.   PLOF: Independent  PATIENT GOALS: Pt would like to walk straight.   NEXT MD VISIT: 01/19/2024  OBJECTIVE:  Note: Objective measures were completed at Evaluation unless otherwise noted.  DIAGNOSTIC FINDINGS: IMPRESSION: 1. Progressive fluid signal tracking between the gluteus minimus and within and along the gluteus medius muscle extending down to moderate gluteus medius tendinopathy and peritendinitis. Mild to moderate left gluteus minimus tendinopathy distally. 2. Trace left trochanteric bursitis. 3. Moderate degenerative hip arthropathy bilaterally. 4. Proximal hamstring tendinopathy and likely partial tearing similar to prior. 5. Substantial loss of intervertebral disc height at L4-5 and L5-S1 with type 2 degenerative endplate findings eccentric to the right at L4-5. 6. Intramural uterine fibroids. 7. Sigmoid colon diverticulosis.  PATIENT SURVEYS:  25/80  COGNITION: Overall cognitive status: Within functional limits for tasks assessed     SENSATION: WFL  EDEMA:  Localized swelling from surgery.   POSTURE: No Significant postural limitations  PALPATION: Tenderness to palpation at surgical site.   LOWER EXTREMITY ROM:  Passive ROM Right eval Left eval 02/26/24   Hip flexion   92  Hip extension     Hip abduction     Hip adduction     Hip internal rotation     Hip external rotation     Knee flexion     Knee extension     Ankle dorsiflexion     Ankle plantarflexion     Ankle inversion     Ankle eversion      (Blank rows = not tested)  LOWER EXTREMITY MMT:  MMT Right eval Left eval  Hip flexion    Hip extension    Hip abduction    Hip adduction    Hip internal rotation    Hip external rotation    Knee flexion    Knee extension     Ankle dorsiflexion    Ankle plantarflexion    Ankle inversion    Ankle eversion     (Blank rows = not tested)   GAIT: Distance walked: 53ft  Assistive device utilized: Walker - 2 wheeled Level of assistance: Complete Independence Comments: Slow, antalgic gait with PWB. (30% WB per pt).  TREATMENT DATE:  03/09/24: Nustep lvl 5, 5 min UE/ LE's LAQ with 2# 2x10  Standing hip abduction with 2# x15 each leg  Standing hip extension with 2# x15 each leg  Standing hip flexion with 2# x15 leg  Marching on airex pad to tolerance Step ups to 4 step with R LE only.    03/07/24 Nustep lvl 5, 5 min UE/ LE's Sit to stand  Side stepping at countertop x 6 laps  Standing hip abduction with 2# x15 each leg  Standing hip extension with 2# x15 each leg  Marching with 2# to tolerance Step up to 4 step.   03/03/24 Reviewed pain level, response to prior treatment, HEP compliance, and current function.  Nustep lvl 4 x 5 mins UE/LE's  Gait:  Pt ambulates with SPC.  She has slow gait speed with rightward lean.  Pt favors L LE with increased Wb'ing on R LE.  Pt received L hip PROM in flexion, abd, ER, and IR per protocol ranges w/n pt and tissue tolerance L hip PROM:   Flexion:  100 deg Abd:  15 IR:  14 deg  Q-ped rocking x 10 reps Stool rotations x 10 each Supine bridges 2x10  LEFS:  45/80    02/26/24 Hooklying adductor sqz 5 2x10 Modified thomas stretch with PT assist due to big stretch LAQ  1 x10  Supine SL march (bilat) with adductor sqz 1 x10  STM to quads, IT band LTR  Q-ped rocking 1x10 Quad knee extension 1x10 bilat  Nustep lvl 5, 5 min Discussed Seated PPT and marching prior to getting out of bed  02/10/24 Pt received L hip PROM in flexion and abd per protocol ranges w/n pt and tissue tolerance  Attempted upright bike though stopped  due to pain Hooklying adductor sqz 5 2x10 Q-ped rocking 2x10 Seated submax hip flexion isometric with 5 sec hold x 10 reps LAQ  1.5#  2x10, 1x5 Supine bridge 2x10 Standing TKE with YTB 2x10  Updated HEP and gave pt a HEP handout.  PT educated pt in correct form and appropriate frequency.      02/08/24 Hooklying adductor sqz 5 2x10 Q-ped rocking 2x10 Seated submax hip flexion isometric with 5 sec hold x 10 reps LAQ  1.5#  2x10, 1x5 Supine bridge x10  Pt received L hip PROM in flexion and abd per protocol ranges w/n pt and tissue tolerance  02/03/24 PROM L hip Hooklying adductor sqz 5 2x10 Hooklying hip flexor isoemtric 5 x10 Hooklying hip abduction isometric 5 x10 Q-ped weight shifting x10ea LAQ 1.5# 5 2x10 Standing HR x20    02/01/24 Reviewed pt presentation, response to prior treatment, pain level, and HEP compliance.   Pt received R hip PROM in flexion and abd per protocol ranges w/n pt and tissue tolerance  Hooklying adductor squeze 5 2x10 PPT 2x10 LAQ 3x10 Q-ped rocking to 90 deg 2x10  01/28/24: Reviewed pt presentation, response to prior treatment, pain level, and HEP compliance.   Pt received L hip PROM in flexion and abd per protocol ranges w/n pt and tissue tolerance Roller to L quads   Hooklying adductor squeeze 5 2x10 PPT 2x10 3 hold LAQ 2 x 10  01/26/24: Reviewed pt presentation, response to prior treatment, pain level, and HEP compliance.   Pt received R hip PROM in flexion and abd per protocol ranges w/n pt and tissue tolerance  Hooklying adductor squeze 5 2x10 SAQ 2x10 PPT 3x10 Quad sets with 5 sec hold x10 reps  PATIENT EDUCATION:  Education details: relevant anatomy, exercise form, diagnosis, post op limitations and restrictions, HEP,  and POC. Person educated: Patient Education method: Medical illustrator, verbal cues Education comprehension: verbalized understanding, returned demonstration, verbal cues  required, and tactile cues required  HOME EXERCISE PROGRAM: Access Code: [email or web address] URL: [email or web address] Date: 01/08/2024 Prepared by: Rojean Batten  Exercises - Supine Posterior Pelvic Tilt  - 2 x daily - 7 x weekly - 1-2 sets - 10 reps - 5 hold - Seated Hip Adduction Isometrics with Ball  - 2 x daily - 7 x weekly - 1-2 sets - 10 reps - 5 hold - Supine Short Arc Quad  - 2 x daily - 7 x weekly - 1-2 sets - 10 reps - 5 hold  Updated HEP: - Supine Bridge  - 1 x daily - 5-7 x weekly - 2 sets - 10 reps - Seated Long Arc Quad  - 1 x daily - 7 x weekly - 2 sets - 10 reps   ASSESSMENT:  CLINICAL IMPRESSION:  Pt tolerated session well today. Appt truncated due to pt having workers come to her home today. She had pain in her L knee today limiting sit to stands and step up's on that side today. Pt will continue to benefit from skilled PT to address continued deficits.     OBJECTIVE IMPAIRMENTS: decreased activity tolerance, difficulty walking, decreased balance, decreased endurance, decreased mobility, decreased ROM, decreased strength, impaired flexibility, impaired UE/LE use, postural dysfunction, and pain.  ACTIVITY LIMITATIONS: bending, lifting, carry, locomotion, cleaning, community activity, driving, and or occupation  PERSONAL FACTORS: Age, second surgical procedure to same location, past medical history are also affecting patient's functional outcome.  REHAB POTENTIAL: Good  CLINICAL DECISION MAKING: Stable/uncomplicated  EVALUATION COMPLEXITY: Low    SHORT TERM GOALS: 01/28/24  Pt will be independent and compliant with HEP for improved pain, strength, and function. Baseline: Goal status: MET 9/3 Target date:      2.  Pt will demo improved quality of gait with increased stance time and toe off on L.   Baseline:  Goal status: IN PROGRESS 9/3 Target date:     3.  Pt will progress with exercises per protocol without adverse effects for improved strength  and mobility.   Baseline:  Goal status: INITIAL Target date:       LONG TERM GOALS: Target date: 03/24/24     Pt will be able to ambulate extended community distance without significant difficulty and pain.   Baseline:  Goal status: Ongoing 02/26/24   2.  Pt will be able to perform her ADLs and IADLs including household chores without significant difficulty and pain. Baseline:  Goal status: Ongoing 02/26/24   3.  Pt will be able to take care of her husband without significant limitations.   Baseline:  Goal status: Ongoing 02/26/24   4.  Pt will be able to perform mini squats with good form and without increased pain in order for improved functional LE strength and to assist with returning to gardening activities.   Baseline:  Goal status: INITIAL  Pt will report improved tolerance with standing activities and ambulation.  Baseline:  Goal status: INITIAL Target date:       5.  Pt will ambulate with a normalized heel to toe gait without limping.  Baseline:  Goal status: INITIAL Target date:      6.  Pt will be able to perform a 6 inch step up with good form and control with  L LE leading for improved performance of stairs abd and improved functional strength. Baseline:  Goal status: INITIAL Target date:       PLAN:   PT FREQUENCY:  2x/wk    PT DURATION: 8 weeks  PLANNED INTERVENTIONS (unless contraindicated): aquatic PT, Canalith repositioning, cryotherapy, Electrical stimulation, Iontophoresis with 4 mg/ml dexamethasome, Moist heat, traction, Ultrasound, gait training, Therapeutic exercise, balance training, neuromuscular re-education, patient/family education, prosthetic training, manual techniques, passive ROM, dry needling, taping, vasopnuematic device, vestibular, spinal manipulations, joint manipulations  PLAN FOR NEXT SESSION:  Cont per Dr. Danetta Patch med repair protocol.   Rojean Batten PT, DPT 03/09/24  9:18 AM

## 2024-03-09 ENCOUNTER — Encounter (HOSPITAL_BASED_OUTPATIENT_CLINIC_OR_DEPARTMENT_OTHER): Payer: Self-pay | Admitting: Physical Therapy

## 2024-03-09 ENCOUNTER — Ambulatory Visit (HOSPITAL_BASED_OUTPATIENT_CLINIC_OR_DEPARTMENT_OTHER): Admitting: Physical Therapy

## 2024-03-09 DIAGNOSIS — M25552 Pain in left hip: Secondary | ICD-10-CM

## 2024-03-09 DIAGNOSIS — M6281 Muscle weakness (generalized): Secondary | ICD-10-CM

## 2024-03-09 DIAGNOSIS — R262 Difficulty in walking, not elsewhere classified: Secondary | ICD-10-CM | POA: Diagnosis not present

## 2024-03-13 NOTE — Therapy (Unsigned)
 OUTPATIENT PHYSICAL THERAPY LOWER EXTREMITY TREATMENT      Patient Name: Ellen [name] MRN: [id number] DOB:1940-08-20, 83 y.o., female Today's Date: 03/14/2024  END OF SESSION:  PT End of Session - 03/14/24 0943     Visit Number 14    Number of Visits 24    Date for Recertification  04/28/24    Authorization Type MCR A & B    Progress Note Due on Visit 20    PT Start Time 0933    PT Stop Time 1012    PT Time Calculation (min) 39 min    Activity Tolerance Patient tolerated treatment well    Behavior During Therapy [facility] for tasks assessed/performed                      Past Medical History:  Diagnosis Date   Allergy    Anxiety    Arthritis    Bursitis    left thigh   GERD (gastroesophageal reflux disease)    History of kidney stones    Hypertension    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   Left Buccal Cheek (Keratoacanthoma)   Umbilical hernia    Past Surgical History:  Procedure Laterality Date   BOWEL RESECTION  07/09/2013   Procedure: SMALL BOWEL RESECTION;  Surgeon: Krystal CHRISTELLA Spinner, MD;  Location: [facility];  Service: General;;   RODDIE NEVIN REPAIR Left 03/22/2023   Procedure: LEFT GLUTEUS MEDIUS REPAIR WITH POSSIBLE COLLAGEN PATCH AUGMENTATION;  Surgeon: Genelle Standing, MD;  Location: [facility];  Service: Orthopedics;  Laterality: Left;   GLUTEUS MINIMUS REPAIR Left 01/04/2024   Procedure: REPAIR, TENDON, GLUTEUS MINIMUS;  Surgeon: Genelle Standing, MD;  Location: [facility];  Service: Orthopedics;  Laterality: Left;  LEFT HIP GLUTEUS MAXIMUS TENDON TRANSFER WITH COLLAGEN PATCH AUGMENTATION   LAPAROTOMY N/A 07/09/2013   Procedure: EXPLORATORY LAPAROTOMY;  Surgeon: Krystal CHRISTELLA Spinner, MD;  Location: [facility];  Service: General;  Laterality: N/A;   SHOULDER ARTHROSCOPY WITH CAPSULORRHAPHY     Patient Active Problem List   Diagnosis Date Noted   B12 deficiency 07/30/2023   Skin ulcer of ankle, limited to  breakdown of skin (HCC) 04/28/2023   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   Hypokalemia 12/27/2022   Volume depletion 12/24/2022   Constipation 12/24/2022   Partial small bowel obstruction (HCC) 12/24/2022   Polycythemia 12/24/2022   Gastroesophageal reflux disease 08/26/2022   Bloating 08/26/2022   Small intestinal bacterial overgrowth (SIBO) 08/26/2022   Osteoarthritis 12/01/2021   Plantar wart 12/01/2021   Ventral hernia 01/01/2021   Diverticulosis 01/05/2019   Dermatitis 01/05/2019   Systolic murmur 07/25/2018   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   Hyperglycemia 10/05/2017   Leg edema 10/05/2017   Hammer toe 10/05/2017   Hot flashes due to menopause 10/05/2017   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/10/2013 07/11/2013   Hypertension 07/09/2013    PCP: [place] CHRISTELLA, MD  REFERRING PROVIDER: Genelle Standing, MD  REFERRING DIAG: Tendinopathy of gluteus medius [F32.040]   THERAPY DIAG:  Pain in left hip  Difficulty in walking, not elsewhere classified  Muscle weakness (generalized)  Rationale for Evaluation and Treatment: Rehabilitation  ONSET DATE: 01/04/2024  SUBJECTIVE:   SUBJECTIVE STATEMENT:  I have been around a 5-6 pain level.     PERTINENT HISTORY: L hip gluteus maximus tendon transfer and trochanteric bursectomy on 01/04/24 Glute min repair, Laparotomy, Shoulder arthroscopy with capsulorrhaphy.   PAIN:  Are you having  pain? Yes: NPRS scale: 0/10   Pain location: L lateral hip down to knee, Pt states it hurts at the incision Pain description: Constant pain Aggravating factors: Walking, standing, sitting, any hip flexion  Relieving factors: Ice.   PRECAUTIONS: Other: gute min, fall risk.  RED FLAGS: None   WEIGHT BEARING RESTRICTIONS: Yes WBAT  FALLS:  Has patient fallen in last 6 months? No  LIVING ENVIRONMENT: Lives with: lives with their spouse Lives in: House/apartment Stairs: Yes: Internal: 12 steps; on right  going up and External: 4 steps; bilateral but cannot reach both Has following equipment at home: Single point cane, Walker - 2 wheeled, Environmental Consultant - 4 wheeled, and Grab bars  OCCUPATION: Human resources officer.   PLOF: Independent  PATIENT GOALS: Pt would like to walk straight.   NEXT MD VISIT: 01/19/2024  OBJECTIVE:  Note: Objective measures were completed at Evaluation unless otherwise noted.  DIAGNOSTIC FINDINGS: IMPRESSION: 1. Progressive fluid signal tracking between the gluteus minimus and within and along the gluteus medius muscle extending down to moderate gluteus medius tendinopathy and peritendinitis. Mild to moderate left gluteus minimus tendinopathy distally. 2. Trace left trochanteric bursitis. 3. Moderate degenerative hip arthropathy bilaterally. 4. Proximal hamstring tendinopathy and likely partial tearing similar to prior. 5. Substantial loss of intervertebral disc height at L4-5 and L5-S1 with type 2 degenerative endplate findings eccentric to the right at L4-5. 6. Intramural uterine fibroids. 7. Sigmoid colon diverticulosis.  PATIENT SURVEYS:  25/80  COGNITION: Overall cognitive status: Within functional limits for tasks assessed     SENSATION: WFL  EDEMA:  Localized swelling from surgery.   POSTURE: No Significant postural limitations  PALPATION: Tenderness to palpation at surgical site.   LOWER EXTREMITY ROM:  Passive ROM Right eval Left eval 02/26/24   Hip flexion   92  Hip extension     Hip abduction     Hip adduction     Hip internal rotation     Hip external rotation     Knee flexion     Knee extension     Ankle dorsiflexion     Ankle plantarflexion     Ankle inversion     Ankle eversion      (Blank rows = not tested)  LOWER EXTREMITY MMT:  MMT Right eval Left eval  Hip flexion    Hip extension    Hip abduction    Hip adduction    Hip internal rotation    Hip external rotation    Knee flexion    Knee extension    Ankle  dorsiflexion    Ankle plantarflexion    Ankle inversion    Ankle eversion     (Blank rows = not tested)   GAIT: Distance walked: 30ft  Assistive device utilized: Walker - 2 wheeled Level of assistance: Complete Independence Comments: Slow, antalgic gait with PWB. (30% WB per pt).  TREATMENT DATE:  03/13/24: Nustep lvl 5, 5 min UE/ LE's Standing hip abduction with 2# x15 each leg  Standing hip extension with 2# x15 each leg  Standing hip flexion with 2# x15 leg  Marching on airex pad to tolerance Step ups to 4 step with R LE only.  Marching on airex pad to tolerance Toe touch to cone  Marching over hurdles 6 laps    03/09/24: Nustep lvl 5, 5 min UE/ LE's LAQ with 2# 2x10  Standing hip abduction with 2# x15 each leg  Standing hip extension with 2# x15 each leg  Standing hip flexion with 2# x15 leg  Marching on airex pad to tolerance Step ups to 4 step with R LE only.    03/07/24 Nustep lvl 5, 5 min UE/ LE's Sit to stand  Side stepping at countertop x 6 laps  Standing hip abduction with 2# x15 each leg  Standing hip extension with 2# x15 each leg  Marching with 2# to tolerance Step up to 4 step.   03/03/24 Reviewed pain level, response to prior treatment, HEP compliance, and current function.  Nustep lvl 4 x 5 mins UE/LE's  Gait:  Pt ambulates with SPC.  She has slow gait speed with rightward lean.  Pt favors L LE with increased Wb'ing on R LE.  Pt received L hip PROM in flexion, abd, ER, and IR per protocol ranges w/n pt and tissue tolerance L hip PROM:   Flexion:  100 deg Abd:  15 IR:  14 deg  Q-ped rocking x 10 reps Stool rotations x 10 each Supine bridges 2x10  LEFS:  45/80    02/26/24 Hooklying adductor sqz 5 2x10 Modified thomas stretch with PT assist due to big stretch LAQ  1 x10  Supine SL march (bilat) with  adductor sqz 1 x10  STM to quads, IT band LTR  Q-ped rocking 1x10 Quad knee extension 1x10 bilat  Nustep lvl 5, 5 min Discussed Seated PPT and marching prior to getting out of bed   PATIENT EDUCATION:  Education details: relevant anatomy, exercise form, diagnosis, post op limitations and restrictions, HEP,  and POC. Person educated: Patient Education method: Medical Illustrator, verbal cues Education comprehension: verbalized understanding, returned demonstration, verbal cues required, and tactile cues required  HOME EXERCISE PROGRAM: Access Code: [email or web address] URL: [email or web address] Date: 01/08/2024 Prepared by: Rojean Batten  Exercises - Supine Posterior Pelvic Tilt  - 2 x daily - 7 x weekly - 1-2 sets - 10 reps - 5 hold - Seated Hip Adduction Isometrics with Ball  - 2 x daily - 7 x weekly - 1-2 sets - 10 reps - 5 hold - Supine Short Arc Quad  - 2 x daily - 7 x weekly - 1-2 sets - 10 reps - 5 hold  Updated HEP: - Supine Bridge  - 1 x daily - 5-7 x weekly - 2 sets - 10 reps - Seated Long Arc Quad  - 1 x daily - 7 x weekly - 2 sets - 10 reps   ASSESSMENT:  CLINICAL IMPRESSION:  Pt tolerated session well today. She continues to be challenged the most with step up's. She has pain with 6, but is able to manage 4 with no pain. She will continue to benefit from hip strengthening and balance related activities. Her tolerance to standing is improving.  Pt will continue to benefit from skilled PT to address continued deficits.     OBJECTIVE IMPAIRMENTS: decreased activity tolerance, difficulty walking, decreased  balance, decreased endurance, decreased mobility, decreased ROM, decreased strength, impaired flexibility, impaired UE/LE use, postural dysfunction, and pain.  ACTIVITY LIMITATIONS: bending, lifting, carry, locomotion, cleaning, community activity, driving, and or occupation  PERSONAL FACTORS: Age, second surgical procedure to same location, past  medical history are also affecting patient's functional outcome.  REHAB POTENTIAL: Good  CLINICAL DECISION MAKING: Stable/uncomplicated  EVALUATION COMPLEXITY: Low    SHORT TERM GOALS: 01/28/24  Pt will be independent and compliant with HEP for improved pain, strength, and function. Baseline: Goal status: MET 9/3 Target date:      2.  Pt will demo improved quality of gait with increased stance time and toe off on L.   Baseline:  Goal status: IN PROGRESS 9/3 Target date:     3.  Pt will progress with exercises per protocol without adverse effects for improved strength and mobility.   Baseline:  Goal status: INITIAL Target date:       LONG TERM GOALS: Target date: 04/28/2024     Pt will be able to ambulate extended community distance without significant difficulty and pain.   Baseline:  Goal status: Ongoing 02/26/24    . 5 miles   2.  Pt will be able to perform her ADLs and IADLs including household chores without significant difficulty and pain. Baseline:  Goal status: Ongoing 02/26/24   3.  Pt will be able to take care of her husband without significant limitations.   Baseline:  Goal status: Met 03/13/24   4.  Pt will be able to perform mini squats with good form and without increased pain in order for improved functional LE strength and to assist with returning to gardening activities.   Baseline:  Goal status: Ongoing 03/13/24 Pt able to sit to stand to hi -low table without increased pain  Pt will report improved tolerance with standing activities and ambulation.  Baseline:  Goal status: INITIAL Target date:       5.  Pt will ambulate with a normalized heel to toe gait without limping.  Baseline:  Goal status: INITIAL Target date:      6.  Pt will be able to perform a 6 inch step up with good form and control with L LE leading for improved performance of stairs abd and improved functional strength. Baseline:  Goal status: Ongoing 03/13/24, pt has pain  with 6, but can handle 4  Target date:       PLAN:   PT FREQUENCY:  2x/wk    PT DURATION: 8 weeks  PLANNED INTERVENTIONS (unless contraindicated): aquatic PT, Canalith repositioning, cryotherapy, Electrical stimulation, Iontophoresis with 4 mg/ml dexamethasome, Moist heat, traction, Ultrasound, gait training, Therapeutic exercise, balance training, neuromuscular re-education, patient/family education, prosthetic training, manual techniques, passive ROM, dry needling, taping, vasopnuematic device, vestibular, spinal manipulations, joint manipulations  PLAN FOR NEXT SESSION:  Cont per Dr. Danetta Patch med repair protocol.   Rojean Batten PT, DPT 03/14/24  10:57 AM

## 2024-03-14 ENCOUNTER — Ambulatory Visit (HOSPITAL_BASED_OUTPATIENT_CLINIC_OR_DEPARTMENT_OTHER): Admitting: Physical Therapy

## 2024-03-14 DIAGNOSIS — M6281 Muscle weakness (generalized): Secondary | ICD-10-CM

## 2024-03-14 DIAGNOSIS — R262 Difficulty in walking, not elsewhere classified: Secondary | ICD-10-CM | POA: Diagnosis not present

## 2024-03-14 DIAGNOSIS — M25552 Pain in left hip: Secondary | ICD-10-CM

## 2024-03-16 ENCOUNTER — Ambulatory Visit (HOSPITAL_BASED_OUTPATIENT_CLINIC_OR_DEPARTMENT_OTHER): Admitting: Physical Therapy

## 2024-03-17 ENCOUNTER — Ambulatory Visit (HOSPITAL_BASED_OUTPATIENT_CLINIC_OR_DEPARTMENT_OTHER): Admitting: Orthopaedic Surgery

## 2024-03-17 ENCOUNTER — Other Ambulatory Visit (HOSPITAL_BASED_OUTPATIENT_CLINIC_OR_DEPARTMENT_OTHER): Payer: Self-pay

## 2024-03-17 DIAGNOSIS — M67959 Unspecified disorder of synovium and tendon, unspecified thigh: Secondary | ICD-10-CM

## 2024-03-17 MED ORDER — METHYLPREDNISOLONE 4 MG PO TBPK
ORAL_TABLET | ORAL | 0 refills | Status: DC
  Filled 2024-03-17: qty 21, 6d supply, fill #0

## 2024-03-17 NOTE — Progress Notes (Signed)
 Post Operative Evaluation    Procedure/Date of Surgery: Left hip gluteus maximus tendon transfer 8/19  Interval History:   Presents today for follow-up of her left hip.  She got no relief from her previous injection.  She is now experiencing more proximal based pain at the iliac crest.  PMH/PSH/Family History/Social History/Meds/Allergies:    Past Medical History:  Diagnosis Date   Allergy    Anxiety    Arthritis    Bursitis    left thigh   GERD (gastroesophageal reflux disease)    History of kidney stones    Hypertension    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   Left Buccal Cheek (Keratoacanthoma)   Umbilical hernia    Past Surgical History:  Procedure Laterality Date   BOWEL RESECTION  07/09/2013   Procedure: SMALL BOWEL RESECTION;  Surgeon: Krystal CHRISTELLA Spinner, MD;  Location: [facility];  Service: General;;   RODDIE NEVIN REPAIR Left 03/22/2023   Procedure: LEFT GLUTEUS MEDIUS REPAIR WITH POSSIBLE COLLAGEN PATCH AUGMENTATION;  Surgeon: Genelle Standing, MD;  Location: [facility];  Service: Orthopedics;  Laterality: Left;   GLUTEUS MINIMUS REPAIR Left 01/04/2024   Procedure: REPAIR, TENDON, GLUTEUS MINIMUS;  Surgeon: Genelle Standing, MD;  Location: [facility];  Service: Orthopedics;  Laterality: Left;  LEFT HIP GLUTEUS MAXIMUS TENDON TRANSFER WITH COLLAGEN PATCH AUGMENTATION   LAPAROTOMY N/A 07/09/2013   Procedure: EXPLORATORY LAPAROTOMY;  Surgeon: Krystal CHRISTELLA Spinner, MD;  Location: [facility];  Service: General;  Laterality: N/A;   SHOULDER ARTHROSCOPY WITH CAPSULORRHAPHY     Social History   Socioeconomic History   Marital status: Married    Spouse name: Not on file   Number of children: 0   Years of education: Not on file   Highest education level: Not on file  Occupational History   Occupation: Retired   Tobacco Use   Smoking status: Former    Types: Cigarettes   Smokeless tobacco: Never  Vaping Use    Vaping status: Never Used  Substance and Sexual Activity   Alcohol use: Yes    Alcohol/week: 7.0 standard drinks of alcohol    Types: 7 Glasses of wine per week    Comment: nightly glass of wine   Drug use: No   Sexual activity: Not Currently  Other Topics Concern   Not on file  Social History Narrative   Enjoys gardening    Social Drivers of Health   Financial Resource Strain: Low Risk  (07/22/2023)   Overall Financial Resource Strain (CARDIA)    Difficulty of Paying Living Expenses: Not hard at all  Food Insecurity: No Food Insecurity (07/22/2023)   Hunger Vital Sign    Worried About Running Out of Food in the Last Year: Never true    Ran Out of Food in the Last Year: Never true  Transportation Needs: No Transportation Needs (07/22/2023)   PRAPARE - Administrator, Civil Service (Medical): No    Lack of Transportation (Non-Medical): No  Physical Activity: Sufficiently Active (07/22/2023)   Exercise Vital Sign    Days of Exercise per Week: 5 days    Minutes of Exercise per Session: 120 min  Stress: No Stress Concern Present (07/22/2023)   [employer] of Occupational Health - Occupational Stress Questionnaire    Feeling of  Stress : Not at all  Social Connections: Moderately Integrated (07/22/2023)   Social Connection and Isolation Panel    Frequency of Communication with Friends and Family: Once a week    Frequency of Social Gatherings with Friends and Family: More than three times a week    Attends Religious Services: More than 4 times per year    Active Member of [employer] or Organizations: No    Attends Engineer, Structural: Never    Marital Status: Married   Family History  Problem Relation Age of Onset   Diabetes Mother    Heart attack Mother    CAD Father    Heart attack Father    Breast cancer Neg Hx    Colon cancer Neg Hx    Esophageal cancer Neg Hx    Rectal cancer Neg Hx    Stomach cancer Neg Hx    Allergies  Allergen Reactions   Lactose  Intolerance (Gi) Diarrhea   Sulfa Antibiotics Nausea And Vomiting and Swelling   Current Outpatient Medications  Medication Sig Dispense Refill   methylPREDNISolone  (MEDROL  DOSEPAK) 4 MG TBPK tablet Take per packet instructions 21 tablet 0   aspirin  EC 325 MG tablet Take 1 tablet (325 mg total) by mouth daily. 14 tablet 0   Calcium  Carbonate Antacid (TUMS PO) Take 1 tablet by mouth as needed (reflux).     Carboxymethylcellulose Sodium (EYE DROPS OP) Place 2 drops into both eyes daily. Thera Tears     furosemide  (LASIX ) 40 MG tablet Take 1 tablet (40 mg total) by mouth daily. 90 tablet 0   hydrochlorothiazide  (MICROZIDE ) 12.5 MG capsule TAKE 1 CAPSULE(12.5 MG) BY MOUTH DAILY 90 capsule 0   losartan  (COZAAR ) 50 MG tablet TAKE 1 TABLET(50 MG) BY MOUTH DAILY 90 tablet 0   meloxicam  (MOBIC ) 15 MG tablet Take 1 tablet (15 mg total) by mouth daily. 30 tablet 1   pantoprazole  (PROTONIX ) 40 MG tablet TAKE 1 TABLET(40 MG) BY MOUTH DAILY 90 tablet 0   No current facility-administered medications for this visit.   No results found.  Review of Systems:   A ROS was performed including pertinent positives and negatives as documented in the HPI.   Musculoskeletal Exam:    There were no vitals taken for this visit.  Left hip incision is well-appearing without erythema or drainage.  There is no redness or erythema.  Walks with mildly antalgic gait distal exam is intact  Imaging:      I personally reviewed and interpreted the radiographs.   Assessment:   Status post left hip gluteus maximus tendon transfer overall doing well.  At this time I would like to send her a Medrol  Dosepak as she is quite inflamed and having some symptoms about the left posterior SI joint.  I would also plan to refer her to my partner Dr. Burnetta as I do believe she would benefit from not only a left SI injection but also some adjunctive shockwave therapy over the trochanter and iliac crest area to relieve her postoperative  soreness and muscular pain. Plan :    - Return to 4 weeks for reassessment     I personally saw and evaluated the patient, and participated in the management and treatment plan.  Elspeth Parker, MD Attending Physician, Orthopedic Surgery  This document was dictated using Dragon voice recognition software. A reasonable attempt at proof reading has been made to minimize errors.

## 2024-03-19 NOTE — Therapy (Signed)
 OUTPATIENT PHYSICAL THERAPY LOWER EXTREMITY TREATMENT      Patient Name: Ellen [name] MRN: [id number] DOB:07/17/1940, 83 y.o., female Today's Date: 03/21/2024  END OF SESSION:  PT End of Session - 03/20/24 0854     Visit Number 15    Number of Visits 24    Date for Recertification  04/28/24    Authorization Type MCR A & B    Progress Note Due on Visit 20    PT Start Time 0852    PT Stop Time 0934    PT Time Calculation (min) 42 min    Activity Tolerance Patient tolerated treatment well    Behavior During Therapy [facility] for tasks assessed/performed                       Past Medical History:  Diagnosis Date   Allergy    Anxiety    Arthritis    Bursitis    left thigh   GERD (gastroesophageal reflux disease)    History of kidney stones    Hypertension    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   Left Buccal Cheek (Keratoacanthoma)   Umbilical hernia    Past Surgical History:  Procedure Laterality Date   BOWEL RESECTION  07/09/2013   Procedure: SMALL BOWEL RESECTION;  Surgeon: Krystal CHRISTELLA Spinner, MD;  Location: [facility];  Service: General;;   RODDIE NEVIN REPAIR Left 03/22/2023   Procedure: LEFT GLUTEUS MEDIUS REPAIR WITH POSSIBLE COLLAGEN PATCH AUGMENTATION;  Surgeon: Genelle Standing, MD;  Location: [facility];  Service: Orthopedics;  Laterality: Left;   GLUTEUS MINIMUS REPAIR Left 01/04/2024   Procedure: REPAIR, TENDON, GLUTEUS MINIMUS;  Surgeon: Genelle Standing, MD;  Location: [facility];  Service: Orthopedics;  Laterality: Left;  LEFT HIP GLUTEUS MAXIMUS TENDON TRANSFER WITH COLLAGEN PATCH AUGMENTATION   LAPAROTOMY N/A 07/09/2013   Procedure: EXPLORATORY LAPAROTOMY;  Surgeon: Krystal CHRISTELLA Spinner, MD;  Location: [facility];  Service: General;  Laterality: N/A;   SHOULDER ARTHROSCOPY WITH CAPSULORRHAPHY     Patient Active Problem List   Diagnosis Date Noted   B12 deficiency 07/30/2023   Skin ulcer of ankle, limited to  breakdown of skin (HCC) 04/28/2023   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   Hypokalemia 12/27/2022   Volume depletion 12/24/2022   Constipation 12/24/2022   Partial small bowel obstruction (HCC) 12/24/2022   Polycythemia 12/24/2022   Gastroesophageal reflux disease 08/26/2022   Bloating 08/26/2022   Small intestinal bacterial overgrowth (SIBO) 08/26/2022   Osteoarthritis 12/01/2021   Plantar wart 12/01/2021   Ventral hernia 01/01/2021   Diverticulosis 01/05/2019   Dermatitis 01/05/2019   Systolic murmur 07/25/2018   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   Hyperglycemia 10/05/2017   Leg edema 10/05/2017   Hammer toe 10/05/2017   Hot flashes due to menopause 10/05/2017   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/10/2013 07/11/2013   Hypertension 07/09/2013    PCP: [place] CHRISTELLA, MD  REFERRING PROVIDER: Genelle Standing, MD  REFERRING DIAG: Tendinopathy of gluteus medius [F32.040]   THERAPY DIAG:  Pain in left hip  Muscle weakness (generalized)  Stiffness of left hip, not elsewhere classified  Difficulty in walking, not elsewhere classified  Rationale for Evaluation and Treatment: Rehabilitation  ONSET DATE: 01/04/2024  SUBJECTIVE:   SUBJECTIVE STATEMENT:  Pt is 10 weeks and 6 days post op.  Pt reports compliance with HEP.  Pt states she felt a little tender after prior treatment though had no adverse effects.  Pt states she did some new things last visit.  Pt states she had pain with walking over hurdles last visit.  Pt saw Dr. Genelle on Friday and he is referring pt to Dr. Burnetta for an injection and shockwave therapy.  MD prescribed a dosepak though pt states she is going to wait to take that until she sees Dr. Burnetta.     PERTINENT HISTORY: L hip gluteus maximus tendon transfer and trochanteric bursectomy on 01/04/24 Glute min repair, Laparotomy, Shoulder arthroscopy with capsulorrhaphy.   PAIN:  Are you having pain? Yes: NPRS scale: 4/10    Pain location: anterior and lateral L hip Pain description: Constant pain Aggravating factors: Walking, standing, sitting, any hip flexion  Relieving factors: Ice.   PRECAUTIONS: Other: gute min, fall risk.  RED FLAGS: None   WEIGHT BEARING RESTRICTIONS: Yes WBAT  FALLS:  Has patient fallen in last 6 months? No  LIVING ENVIRONMENT: Lives with: lives with their spouse Lives in: House/apartment Stairs: Yes: Internal: 12 steps; on right going up and External: 4 steps; bilateral but cannot reach both Has following equipment at home: Single point cane, Walker - 2 wheeled, Environmental Consultant - 4 wheeled, and Grab bars  OCCUPATION: Human resources officer.   PLOF: Independent  PATIENT GOALS: Pt would like to walk straight.   NEXT MD VISIT: 01/19/2024  OBJECTIVE:  Note: Objective measures were completed at Evaluation unless otherwise noted.  DIAGNOSTIC FINDINGS: IMPRESSION: 1. Progressive fluid signal tracking between the gluteus minimus and within and along the gluteus medius muscle extending down to moderate gluteus medius tendinopathy and peritendinitis. Mild to moderate left gluteus minimus tendinopathy distally. 2. Trace left trochanteric bursitis. 3. Moderate degenerative hip arthropathy bilaterally. 4. Proximal hamstring tendinopathy and likely partial tearing similar to prior. 5. Substantial loss of intervertebral disc height at L4-5 and L5-S1 with type 2 degenerative endplate findings eccentric to the right at L4-5. 6. Intramural uterine fibroids. 7. Sigmoid colon diverticulosis.  PATIENT SURVEYS:  25/80  COGNITION: Overall cognitive status: Within functional limits for tasks assessed     SENSATION: WFL  EDEMA:  Localized swelling from surgery.   POSTURE: No Significant postural limitations  PALPATION: Tenderness to palpation at surgical site.   LOWER EXTREMITY ROM:  Passive ROM Right eval Left eval 02/26/24   Hip flexion   92  Hip extension     Hip  abduction     Hip adduction     Hip internal rotation     Hip external rotation     Knee flexion     Knee extension     Ankle dorsiflexion     Ankle plantarflexion     Ankle inversion     Ankle eversion      (Blank rows = not tested)  LOWER EXTREMITY MMT:  MMT Right eval Left eval  Hip flexion    Hip extension    Hip abduction    Hip adduction    Hip internal rotation    Hip external rotation    Knee flexion    Knee extension    Ankle dorsiflexion    Ankle plantarflexion    Ankle inversion    Ankle eversion     (Blank rows = not tested)   GAIT: Distance walked: 20ft  Assistive device utilized: Walker - 2 wheeled Level of assistance: Complete Independence Comments: Slow, antalgic gait with PWB. (30% WB per pt).  TREATMENT DATE:  03/20/24 Nustep lvl 5, 5 min UE/ LE's Step ups 2x10 in 4 inch step Mini squats with UE support on rail 2x10 Sidestepping x 3 laps at rail with UE support Marching on airex 2x10 with UE support Stool rotations x 10 Standing hip abduction x 15 with 2# bilat LAQ  2# x12, 3# 2x10  Pt received L hip PROM in flexion, ER, and IR per pt and tissue tolerance.    03/13/24: Nustep lvl 5, 5 min UE/ LE's Standing hip abduction with 2# x15 each leg  Standing hip extension with 2# x15 each leg  Standing hip flexion with 2# x15 leg  Marching on airex pad to tolerance Step ups to 4 step with R LE only.  Marching on airex pad to tolerance Toe touch to cone  Marching over hurdles 6 laps    03/09/24: Nustep lvl 5, 5 min UE/ LE's LAQ with 2# 2x10  Standing hip abduction with 2# x15 each leg  Standing hip extension with 2# x15 each leg  Standing hip flexion with 2# x15 leg  Marching on airex pad to tolerance Step ups to 4 step with R LE only.    03/07/24 Nustep lvl 5, 5 min UE/ LE's Sit to stand  Side stepping  at countertop x 6 laps  Standing hip abduction with 2# x15 each leg  Standing hip extension with 2# x15 each leg  Marching with 2# to tolerance Step up to 4 step.   03/03/24 Reviewed pain level, response to prior treatment, HEP compliance, and current function.  Nustep lvl 4 x 5 mins UE/LE's  Gait:  Pt ambulates with SPC.  She has slow gait speed with rightward lean.  Pt favors L LE with increased Wb'ing on R LE.  Pt received L hip PROM in flexion, abd, ER, and IR per protocol ranges w/n pt and tissue tolerance L hip PROM:   Flexion:  100 deg Abd:  15 IR:  14 deg  Q-ped rocking x 10 reps Stool rotations x 10 each Supine bridges 2x10  LEFS:  45/80    02/26/24 Hooklying adductor sqz 5 2x10 Modified thomas stretch with PT assist due to big stretch LAQ  1 x10  Supine SL march (bilat) with adductor sqz 1 x10  STM to quads, IT band LTR  Q-ped rocking 1x10 Quad knee extension 1x10 bilat  Nustep lvl 5, 5 min Discussed Seated PPT and marching prior to getting out of bed   PATIENT EDUCATION:  Education details: relevant anatomy, exercise form, diagnosis, post op limitations and restrictions, HEP,  and POC. Person educated: Patient Education method: Medical Illustrator, verbal cues Education comprehension: verbalized understanding, returned demonstration, verbal cues required, and tactile cues required  HOME EXERCISE PROGRAM: Access Code: [email or web address] URL: [email or web address] Date: 01/08/2024 Prepared by: Rojean Batten  Exercises - Supine Posterior Pelvic Tilt  - 2 x daily - 7 x weekly - 1-2 sets - 10 reps - 5 hold - Seated Hip Adduction Isometrics with Ball  - 2 x daily - 7 x weekly - 1-2 sets - 10 reps - 5 hold - Supine Short Arc Quad  - 2 x daily - 7 x weekly - 1-2 sets - 10 reps - 5 hold  Updated HEP: - Supine Bridge  - 1 x daily - 5-7 x weekly - 2 sets - 10 reps - Seated Long Arc Quad  - 1 x daily - 7 x weekly - 2 sets - 10  reps  ASSESSMENT:  CLINICAL IMPRESSION:  Pt continues to have pain in L hip and MD referred pt to Dr. Burnetta with recommendation of an injection and shockwave therapy.  Pt performed exercises per protocol well with cuing for correct form.  She tolerated exercises and hip PROM well.  Pt responded well to treatment and had no c/o's after treatment.  She should continue to benefit from skilled PT to address impairments and ongoing goals and to improve overall function.      OBJECTIVE IMPAIRMENTS: decreased activity tolerance, difficulty walking, decreased balance, decreased endurance, decreased mobility, decreased ROM, decreased strength, impaired flexibility, impaired UE/LE use, postural dysfunction, and pain.  ACTIVITY LIMITATIONS: bending, lifting, carry, locomotion, cleaning, community activity, driving, and or occupation  PERSONAL FACTORS: Age, second surgical procedure to same location, past medical history are also affecting patient's functional outcome.  REHAB POTENTIAL: Good  CLINICAL DECISION MAKING: Stable/uncomplicated  EVALUATION COMPLEXITY: Low    SHORT TERM GOALS: 01/28/24  Pt will be independent and compliant with HEP for improved pain, strength, and function. Baseline: Goal status: MET 9/3 Target date:      2.  Pt will demo improved quality of gait with increased stance time and toe off on L.   Baseline:  Goal status: IN PROGRESS 9/3 Target date:     3.  Pt will progress with exercises per protocol without adverse effects for improved strength and mobility.   Baseline:  Goal status: INITIAL Target date:       LONG TERM GOALS: Target date: 04/28/2024     Pt will be able to ambulate extended community distance without significant difficulty and pain.   Baseline:  Goal status: Ongoing 02/26/24    . 5 miles   2.  Pt will be able to perform her ADLs and IADLs including household chores without significant difficulty and pain. Baseline:  Goal status:  Ongoing 02/26/24   3.  Pt will be able to take care of her husband without significant limitations.   Baseline:  Goal status: Met 03/13/24   4.  Pt will be able to perform mini squats with good form and without increased pain in order for improved functional LE strength and to assist with returning to gardening activities.   Baseline:  Goal status: Ongoing 03/13/24 Pt able to sit to stand to hi -low table without increased pain  Pt will report improved tolerance with standing activities and ambulation.  Baseline:  Goal status: INITIAL Target date:       5.  Pt will ambulate with a normalized heel to toe gait without limping.  Baseline:  Goal status: INITIAL Target date:      6.  Pt will be able to perform a 6 inch step up with good form and control with L LE leading for improved performance of stairs abd and improved functional strength. Baseline:  Goal status: Ongoing 03/13/24, pt has pain with 6, but can handle 4  Target date:       PLAN:   PT FREQUENCY:  2x/wk    PT DURATION: 8 weeks  PLANNED INTERVENTIONS (unless contraindicated): aquatic PT, Canalith repositioning, cryotherapy, Electrical stimulation, Iontophoresis with 4 mg/ml dexamethasome, Moist heat, traction, Ultrasound, gait training, Therapeutic exercise, balance training, neuromuscular re-education, patient/family education, prosthetic training, manual techniques, passive ROM, dry needling, taping, vasopnuematic device, vestibular, spinal manipulations, joint manipulations  PLAN FOR NEXT SESSION:  Cont per Dr. Danetta Patch med repair protocol.   Leigh Minerva III PT, DPT 03/21/24 10:59 AM

## 2024-03-20 ENCOUNTER — Ambulatory Visit (HOSPITAL_BASED_OUTPATIENT_CLINIC_OR_DEPARTMENT_OTHER): Attending: Orthopaedic Surgery | Admitting: Physical Therapy

## 2024-03-20 ENCOUNTER — Encounter (HOSPITAL_BASED_OUTPATIENT_CLINIC_OR_DEPARTMENT_OTHER): Payer: Self-pay | Admitting: Physical Therapy

## 2024-03-20 ENCOUNTER — Encounter: Payer: Self-pay | Admitting: Radiology

## 2024-03-20 DIAGNOSIS — M25652 Stiffness of left hip, not elsewhere classified: Secondary | ICD-10-CM | POA: Insufficient documentation

## 2024-03-20 DIAGNOSIS — M25552 Pain in left hip: Secondary | ICD-10-CM | POA: Insufficient documentation

## 2024-03-20 DIAGNOSIS — R262 Difficulty in walking, not elsewhere classified: Secondary | ICD-10-CM | POA: Diagnosis present

## 2024-03-20 DIAGNOSIS — M6281 Muscle weakness (generalized): Secondary | ICD-10-CM | POA: Diagnosis present

## 2024-03-23 ENCOUNTER — Ambulatory Visit (HOSPITAL_BASED_OUTPATIENT_CLINIC_OR_DEPARTMENT_OTHER): Admitting: Physical Therapy

## 2024-03-23 ENCOUNTER — Encounter (HOSPITAL_BASED_OUTPATIENT_CLINIC_OR_DEPARTMENT_OTHER): Payer: Self-pay | Admitting: Physical Therapy

## 2024-03-23 DIAGNOSIS — M6281 Muscle weakness (generalized): Secondary | ICD-10-CM

## 2024-03-23 DIAGNOSIS — M25552 Pain in left hip: Secondary | ICD-10-CM

## 2024-03-23 DIAGNOSIS — M25652 Stiffness of left hip, not elsewhere classified: Secondary | ICD-10-CM

## 2024-03-23 DIAGNOSIS — R262 Difficulty in walking, not elsewhere classified: Secondary | ICD-10-CM | POA: Diagnosis not present

## 2024-03-23 NOTE — Therapy (Signed)
 OUTPATIENT PHYSICAL THERAPY LOWER EXTREMITY TREATMENT      Patient Name: Ellen [name] MRN: [id number] DOB:1940-07-03, 83 y.o., female Today's Date: 03/23/2024  END OF SESSION:  PT End of Session - 03/23/24 0808     Visit Number 16    Number of Visits 24    Date for Recertification  04/28/24    Authorization Type MCR A & B    PT Start Time 0800    PT Stop Time 0828    PT Time Calculation (min) 28 min    Activity Tolerance Patient tolerated treatment well    Behavior During Therapy [facility] for tasks assessed/performed                        Past Medical History:  Diagnosis Date   Allergy    Anxiety    Arthritis    Bursitis    left thigh   GERD (gastroesophageal reflux disease)    History of kidney stones    Hypertension    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   Left Buccal Cheek (Keratoacanthoma)   Umbilical hernia    Past Surgical History:  Procedure Laterality Date   BOWEL RESECTION  07/09/2013   Procedure: SMALL BOWEL RESECTION;  Surgeon: Krystal CHRISTELLA Spinner, MD;  Location: [facility];  Service: General;;   RODDIE NEVIN REPAIR Left 03/22/2023   Procedure: LEFT GLUTEUS MEDIUS REPAIR WITH POSSIBLE COLLAGEN PATCH AUGMENTATION;  Surgeon: Genelle Standing, MD;  Location: [facility];  Service: Orthopedics;  Laterality: Left;   GLUTEUS MINIMUS REPAIR Left 01/04/2024   Procedure: REPAIR, TENDON, GLUTEUS MINIMUS;  Surgeon: Genelle Standing, MD;  Location: [facility];  Service: Orthopedics;  Laterality: Left;  LEFT HIP GLUTEUS MAXIMUS TENDON TRANSFER WITH COLLAGEN PATCH AUGMENTATION   LAPAROTOMY N/A 07/09/2013   Procedure: EXPLORATORY LAPAROTOMY;  Surgeon: Krystal CHRISTELLA Spinner, MD;  Location: [facility];  Service: General;  Laterality: N/A;   SHOULDER ARTHROSCOPY WITH CAPSULORRHAPHY     Patient Active Problem List   Diagnosis Date Noted   B12 deficiency 07/30/2023   Skin ulcer of ankle, limited to breakdown of skin (HCC) 04/28/2023    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   Hypokalemia 12/27/2022   Volume depletion 12/24/2022   Constipation 12/24/2022   Partial small bowel obstruction (HCC) 12/24/2022   Polycythemia 12/24/2022   Gastroesophageal reflux disease 08/26/2022   Bloating 08/26/2022   Small intestinal bacterial overgrowth (SIBO) 08/26/2022   Osteoarthritis 12/01/2021   Plantar wart 12/01/2021   Ventral hernia 01/01/2021   Diverticulosis 01/05/2019   Dermatitis 01/05/2019   Systolic murmur 07/25/2018   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   Hyperglycemia 10/05/2017   Leg edema 10/05/2017   Hammer toe 10/05/2017   Hot flashes due to menopause 10/05/2017   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/10/2013 07/11/2013   Hypertension 07/09/2013    PCP: Kennyth Worth CHRISTELLA, MD  REFERRING PROVIDER: Genelle Standing, MD  REFERRING DIAG: Tendinopathy of gluteus medius [F32.040]   THERAPY DIAG:  Pain in left hip  Muscle weakness (generalized)  Stiffness of left hip, not elsewhere classified  Difficulty in walking, not elsewhere classified  Rationale for Evaluation and Treatment: Rehabilitation  ONSET DATE: 01/04/2024  SUBJECTIVE:   SUBJECTIVE STATEMENT:  Pt is 11 weeks and 2 days post op.  Pt states I feel stiff as a board today.  Pt reports compliance with HEP.  Pt states she felt a little tender after prior treatment though had no  adverse effects.  Pt states she is seeing Dr. Burnetta on Monday and may receive an injection and shockwave therapy.  Pt states her pain is different now, more in anterior hip.  Pt states I only have time for 1/2 an hour due to my husband's MD appt.   PERTINENT HISTORY: L hip gluteus maximus tendon transfer and trochanteric bursectomy on 01/04/24 Glute min repair, Laparotomy, Shoulder arthroscopy with capsulorrhaphy.   PAIN:  Are you having pain? Yes: NPRS scale: 4/10   Pain location: anterior and lateral L hip Pain description: Constant  pain Aggravating factors: Walking, standing, sitting, any hip flexion  Relieving factors: Ice.   PRECAUTIONS: Other: gute min, fall risk.  RED FLAGS: None   WEIGHT BEARING RESTRICTIONS: Yes WBAT  FALLS:  Has patient fallen in last 6 months? No  LIVING ENVIRONMENT: Lives with: lives with their spouse Lives in: House/apartment Stairs: Yes: Internal: 12 steps; on right going up and External: 4 steps; bilateral but cannot reach both Has following equipment at home: Single point cane, Walker - 2 wheeled, Environmental Consultant - 4 wheeled, and Grab bars  OCCUPATION: Human resources officer.   PLOF: Independent  PATIENT GOALS: Pt would like to walk straight.   NEXT MD VISIT: 01/19/2024  OBJECTIVE:  Note: Objective measures were completed at Evaluation unless otherwise noted.  DIAGNOSTIC FINDINGS: IMPRESSION: 1. Progressive fluid signal tracking between the gluteus minimus and within and along the gluteus medius muscle extending down to moderate gluteus medius tendinopathy and peritendinitis. Mild to moderate left gluteus minimus tendinopathy distally. 2. Trace left trochanteric bursitis. 3. Moderate degenerative hip arthropathy bilaterally. 4. Proximal hamstring tendinopathy and likely partial tearing similar to prior. 5. Substantial loss of intervertebral disc height at L4-5 and L5-S1 with type 2 degenerative endplate findings eccentric to the right at L4-5. 6. Intramural uterine fibroids. 7. Sigmoid colon diverticulosis.  PATIENT SURVEYS:  25/80  COGNITION: Overall cognitive status: Within functional limits for tasks assessed     SENSATION: WFL  EDEMA:  Localized swelling from surgery.   POSTURE: No Significant postural limitations  PALPATION: Tenderness to palpation at surgical site.   LOWER EXTREMITY ROM:  Passive ROM Right eval Left eval 02/26/24   Hip flexion   92  Hip extension     Hip abduction     Hip adduction     Hip internal rotation     Hip external  rotation     Knee flexion     Knee extension     Ankle dorsiflexion     Ankle plantarflexion     Ankle inversion     Ankle eversion      (Blank rows = not tested)  LOWER EXTREMITY MMT:  MMT Right eval Left eval  Hip flexion    Hip extension    Hip abduction    Hip adduction    Hip internal rotation    Hip external rotation    Knee flexion    Knee extension    Ankle dorsiflexion    Ankle plantarflexion    Ankle inversion    Ankle eversion     (Blank rows = not tested)   GAIT: Distance walked: 69ft  Assistive device utilized: Walker - 2 wheeled Level of assistance: Complete Independence Comments: Slow, antalgic gait with PWB. (30% WB per pt).  TREATMENT DATE:  03/23/24 Nustep lvl 5, 5 mins UE/LE's  Pt received L hip PROM in flexion, abd, ER, and IR per pt and tissue tolerance.   LAQ 3# 3x10 Step ups 4 inch step 2x10 Marching on airex 2x10 with UE support    03/20/24 Nustep lvl 5, 5 min UE/ LE's Step ups 2x10 in 4 inch step Mini squats with UE support on rail 2x10 Sidestepping x 3 laps at rail with UE support Marching on airex 2x10 with UE support Stool rotations x 10 Standing hip abduction x 15 with 2# bilat LAQ  2# x12, 3# 2x10  Pt received L hip PROM in flexion, ER, and IR per pt and tissue tolerance.    03/13/24: Nustep lvl 5, 5 min UE/ LE's Standing hip abduction with 2# x15 each leg  Standing hip extension with 2# x15 each leg  Standing hip flexion with 2# x15 leg  Marching on airex pad to tolerance Step ups to 4 step with R LE only.  Marching on airex pad to tolerance Toe touch to cone  Marching over hurdles 6 laps    03/09/24: Nustep lvl 5, 5 min UE/ LE's LAQ with 2# 2x10  Standing hip abduction with 2# x15 each leg  Standing hip extension with 2# x15 each leg  Standing hip flexion with 2# x15 leg  Marching on  airex pad to tolerance Step ups to 4 step with R LE only.    03/07/24 Nustep lvl 5, 5 min UE/ LE's Sit to stand  Side stepping at countertop x 6 laps  Standing hip abduction with 2# x15 each leg  Standing hip extension with 2# x15 each leg  Marching with 2# to tolerance Step up to 4 step.   03/03/24 Reviewed pain level, response to prior treatment, HEP compliance, and current function.  Nustep lvl 4 x 5 mins UE/LE's  Gait:  Pt ambulates with SPC.  She has slow gait speed with rightward lean.  Pt favors L LE with increased Wb'ing on R LE.  Pt received L hip PROM in flexion, abd, ER, and IR per protocol ranges w/n pt and tissue tolerance L hip PROM:   Flexion:  100 deg Abd:  15 IR:  14 deg  Q-ped rocking x 10 reps Stool rotations x 10 each Supine bridges 2x10  LEFS:  45/80    02/26/24 Hooklying adductor sqz 5 2x10 Modified thomas stretch with PT assist due to big stretch LAQ  1 x10  Supine SL march (bilat) with adductor sqz 1 x10  STM to quads, IT band LTR  Q-ped rocking 1x10 Quad knee extension 1x10 bilat  Nustep lvl 5, 5 min Discussed Seated PPT and marching prior to getting out of bed   PATIENT EDUCATION:  Education details: relevant anatomy, exercise form, diagnosis, post op limitations and restrictions, HEP,  and POC. Person educated: Patient Education method: Medical Illustrator, verbal cues Education comprehension: verbalized understanding, returned demonstration, verbal cues required, and tactile cues required  HOME EXERCISE PROGRAM: Access Code: [email or web address] URL: [email or web address] Date: 01/08/2024 Prepared by: Rojean Batten  Exercises - Supine Posterior Pelvic Tilt  - 2 x daily - 7 x weekly - 1-2 sets - 10 reps - 5 hold - Seated Hip Adduction Isometrics with Ball  - 2 x daily - 7 x weekly - 1-2 sets - 10 reps - 5 hold - Supine Short Arc Quad  - 2 x daily - 7 x weekly - 1-2 sets - 10 reps - 5  hold  Updated HEP: - Supine  Bridge  - 1 x daily - 5-7 x weekly - 2 sets - 10 reps - Seated Long Arc Quad  - 1 x daily - 7 x weekly - 2 sets - 10 reps   ASSESSMENT:  CLINICAL IMPRESSION:  Pt was limited in treatment time today due to having to take her husband to the MD.  Pt continues to have lateral hip pain though states she is having more anterior hip pain now.  Pt tolerated hip PROM well.  She had hip pain with step ups on a  4 inch step.  Pt felt fine with marching on airex.  She reports no change in pain after treatment, still 4/10.  She should benefit from continued skilled PT to address impairments and ongoing goals and to improve overall function.         OBJECTIVE IMPAIRMENTS: decreased activity tolerance, difficulty walking, decreased balance, decreased endurance, decreased mobility, decreased ROM, decreased strength, impaired flexibility, impaired UE/LE use, postural dysfunction, and pain.  ACTIVITY LIMITATIONS: bending, lifting, carry, locomotion, cleaning, community activity, driving, and or occupation  PERSONAL FACTORS: Age, second surgical procedure to same location, past medical history are also affecting patient's functional outcome.  REHAB POTENTIAL: Good  CLINICAL DECISION MAKING: Stable/uncomplicated  EVALUATION COMPLEXITY: Low    SHORT TERM GOALS: 01/28/24  Pt will be independent and compliant with HEP for improved pain, strength, and function. Baseline: Goal status: MET 9/3 Target date:      2.  Pt will demo improved quality of gait with increased stance time and toe off on L.   Baseline:  Goal status: IN PROGRESS 9/3 Target date:     3.  Pt will progress with exercises per protocol without adverse effects for improved strength and mobility.   Baseline:  Goal status: INITIAL Target date:       LONG TERM GOALS: Target date: 04/28/2024     Pt will be able to ambulate extended community distance without significant difficulty and pain.   Baseline:  Goal status: Ongoing  02/26/24    . 5 miles   2.  Pt will be able to perform her ADLs and IADLs including household chores without significant difficulty and pain. Baseline:  Goal status: Ongoing 02/26/24   3.  Pt will be able to take care of her husband without significant limitations.   Baseline:  Goal status: Met 03/13/24   4.  Pt will be able to perform mini squats with good form and without increased pain in order for improved functional LE strength and to assist with returning to gardening activities.   Baseline:  Goal status: Ongoing 03/13/24 Pt able to sit to stand to hi -low table without increased pain  Pt will report improved tolerance with standing activities and ambulation.  Baseline:  Goal status: INITIAL Target date:       5.  Pt will ambulate with a normalized heel to toe gait without limping.  Baseline:  Goal status: INITIAL Target date:      6.  Pt will be able to perform a 6 inch step up with good form and control with L LE leading for improved performance of stairs abd and improved functional strength. Baseline:  Goal status: Ongoing 03/13/24, pt has pain with 6, but can handle 4  Target date:       PLAN:   PT FREQUENCY:  2x/wk    PT DURATION: 8 weeks  PLANNED INTERVENTIONS (unless contraindicated): aquatic PT, Canalith repositioning,  cryotherapy, Electrical stimulation, Iontophoresis with 4 mg/ml dexamethasome, Moist heat, traction, Ultrasound, gait training, Therapeutic exercise, balance training, neuromuscular re-education, patient/family education, prosthetic training, manual techniques, passive ROM, dry needling, taping, vasopnuematic device, vestibular, spinal manipulations, joint manipulations  PLAN FOR NEXT SESSION:  Cont per Dr. Danetta Patch med repair protocol.   Leigh Minerva III PT, DPT 03/23/24 8:45 AM

## 2024-03-25 ENCOUNTER — Other Ambulatory Visit: Payer: Self-pay | Admitting: Family Medicine

## 2024-03-27 ENCOUNTER — Encounter: Payer: Self-pay | Admitting: Sports Medicine

## 2024-03-27 ENCOUNTER — Other Ambulatory Visit: Payer: Self-pay

## 2024-03-27 ENCOUNTER — Ambulatory Visit: Admitting: Sports Medicine

## 2024-03-27 DIAGNOSIS — M67959 Unspecified disorder of synovium and tendon, unspecified thigh: Secondary | ICD-10-CM | POA: Diagnosis not present

## 2024-03-27 DIAGNOSIS — G8929 Other chronic pain: Secondary | ICD-10-CM | POA: Diagnosis not present

## 2024-03-27 DIAGNOSIS — M7918 Myalgia, other site: Secondary | ICD-10-CM

## 2024-03-27 DIAGNOSIS — M7632 Iliotibial band syndrome, left leg: Secondary | ICD-10-CM

## 2024-03-27 DIAGNOSIS — M533 Sacrococcygeal disorders, not elsewhere classified: Secondary | ICD-10-CM

## 2024-03-27 NOTE — Progress Notes (Signed)
 Ellen [name] - 83 y.o. female MRN [id number]  Date of birth: 1940/09/21  Office Visit Note: Visit Date: 03/27/2024 PCP: Kennyth Worth HERO, MD Referred by: Kennyth Worth HERO, MD  Subjective: Chief Complaint  Patient presents with   Lower Back - Pain   HPI: Ellen [name] is a pleasant 83 y.o. female who presents today for chronic left sided low back/SI joint pain as well as lateral hip pain.  She is status post left hip gluteus maximus tendon transfer on 01/04/2024 with Dr. Genelle.  This was her second surgery.  She is having rather severe and debilitating pain in the posterior buttock but more so around the left lateral hip.  She is here to discuss SI joint injection as well as extracorporeal shockwave therapy.  She has been very diligent about her formalized physical therapy, does have an appointment upcoming tomorrow.  She has trialed meloxicam  15 mg daily but this was not helpful.  We did send in a Medrol  Dosepak but she has held on this until we have our appointment today.  Tylenol  and Aleve does help take the edge off.  Lab Results  Component Value Date   HGBA1C 5.4 06/28/2023   Pertinent ROS were reviewed with the patient and found to be negative unless otherwise specified above in HPI.   Assessment & Plan: Visit Diagnoses:  1. Chronic left SI joint pain   2. Left buttock pain   3. Tendinopathy of gluteus medius   4. It band syndrome, left    Plan: Impression is persistent and ongoing left posterior and lateral hip pain in the setting of previous gluteus maximus tendon transfer.  She does have pain emanating from the SI joint into the buttock, given this we did proceed with ultrasound-guided SI joint injection.  I do also believe she would benefit from extracorporeal shockwave therapy for the lateral hip, the gluteal musculature as well as the proximal IT band, she has now developed compensatory pain and fascial restriction. We discussed setting up at least 2-3  appointments for this ECSWT and evaluation and then seeing what sort of cumulative benefit she has going forward.  Given that meloxicam  15mg   was not helpful, she will discontinue this going forward. Fine to use Tylenol  and/or Aleve only as needed.  Given corticosteroid injection today, she will hold on the Medrol  Dosepak but this is something she could consider if needed going forward.  She will continue her PT in the interim, but I would like her to hold from PT or other vigorous activity for the next 48 hours.  Follow-up: Return in about 1 week (around 04/03/2024) for make 2 appts about 1-week apart for lat hip (SWT trial).   Meds & Orders: No orders of the defined types were placed in this encounter.   Orders Placed This Encounter  Procedures   US  Guided Needle Placement - No Linked Charges     Procedures: U/S-guided SI-joint injection, Left   After discussion of risk/benefits/indications, informed verbal consent was obtained. A timeout was then performed. The patient was positioned in a prone position on exam room table with a pillow placed under the pelvis for mild hip flexion. The SI joint area was cleaned and prepped with betadine and alcohol swabs. Sterile ultrasound gel was applied and the ultrasound transducer was placed in an anatomic axial plane over the PSIS, then moved distally over the SI-joint. Using ultrasound guidance, a 22-gauge, 3.5 needle was inserted from a medial to lateral approach utilizing an  in-plane approach and directed into the SI-joint. The SI-joint was then injected with a mixture of 4:1.5 lidocaine :depomedrol with visualization of the injectate flow into the SI-joint under ultrasound visualization. The patient tolerated the procedure well without immediate complications.       Clinical History: No specialty comments available.  She reports that she has quit smoking. Her smoking use included cigarettes. She has never used smokeless tobacco.  Recent Labs     06/28/23 1131  HGBA1C 5.4    Objective:    Physical Exam  Gen: Well-appearing, in no acute distress; non-toxic CV: Well-perfused. Warm.  Resp: Breathing unlabored on room air; no wheezing. Psych: Fluid speech in conversation; appropriate affect; normal thought process  Ortho Exam - Low back/SI-joint/L-hip: + TTP more in the buttock in the gluteal region although mild tenderness over the left SI joint. + Fortin's point test.  There is well-healed incision from previous gluteus maximus repair.  Imaging: No results found.  Past Medical/Family/Surgical/Social History: Medications & Allergies reviewed per EMR, new medications updated. 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   Hypokalemia 12/27/2022   Volume depletion 12/24/2022   Constipation 12/24/2022   Partial small bowel obstruction (HCC) 12/24/2022   Polycythemia 12/24/2022   Gastroesophageal reflux disease 08/26/2022   Bloating 08/26/2022   Small intestinal bacterial overgrowth (SIBO) 08/26/2022   Osteoarthritis 12/01/2021   Plantar wart 12/01/2021   Ventral hernia 01/01/2021   Diverticulosis 01/05/2019   Dermatitis 01/05/2019   Systolic murmur 07/25/2018   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   Hyperglycemia 10/05/2017   Leg edema 10/05/2017   Hammer toe 10/05/2017   Hot flashes due to menopause 10/05/2017   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/10/2013 07/11/2013   Hypertension 07/09/2013   Past Medical History:  Diagnosis Date   Allergy    Anxiety    Arthritis    Bursitis    left thigh   GERD (gastroesophageal reflux disease)    History of kidney stones    Hypertension    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   Left Buccal Cheek (Keratoacanthoma)   Umbilical hernia    Family History  Problem Relation Age of Onset   Diabetes  Mother    Heart attack Mother    CAD Father    Heart attack Father    Breast cancer Neg Hx    Colon cancer Neg Hx    Esophageal cancer Neg Hx    Rectal cancer Neg Hx    Stomach cancer Neg Hx    Past Surgical History:  Procedure Laterality Date   BOWEL RESECTION  07/09/2013   Procedure: SMALL BOWEL RESECTION;  Surgeon: Krystal CHRISTELLA Spinner, MD;  Location: [facility];  Service: General;;   RODDIE NEVIN REPAIR Left 03/22/2023   Procedure: LEFT GLUTEUS MEDIUS REPAIR WITH POSSIBLE COLLAGEN PATCH AUGMENTATION;  Surgeon: Genelle Standing, MD;  Location: [facility];  Service: Orthopedics;  Laterality: Left;   GLUTEUS MINIMUS REPAIR Left 01/04/2024   Procedure: REPAIR, TENDON, GLUTEUS MINIMUS;  Surgeon: Genelle Standing, MD;  Location: [facility];  Service: Orthopedics;  Laterality: Left;  LEFT HIP GLUTEUS MAXIMUS TENDON TRANSFER WITH COLLAGEN PATCH AUGMENTATION   LAPAROTOMY N/A 07/09/2013   Procedure: EXPLORATORY LAPAROTOMY;  Surgeon: Krystal CHRISTELLA Spinner, MD;  Location: [facility];  Service: General;  Laterality: N/A;   SHOULDER ARTHROSCOPY  WITH CAPSULORRHAPHY     Social History   Occupational History   Occupation: Retired   Tobacco Use   Smoking status: Former    Types: Cigarettes   Smokeless tobacco: Never  Vaping Use   Vaping status: Never Used  Substance and Sexual Activity   Alcohol use: Yes    Alcohol/week: 7.0 standard drinks of alcohol    Types: 7 Glasses of wine per week    Comment: nightly glass of wine   Drug use: No   Sexual activity: Not Currently

## 2024-03-28 ENCOUNTER — Ambulatory Visit (HOSPITAL_BASED_OUTPATIENT_CLINIC_OR_DEPARTMENT_OTHER): Admitting: Physical Therapy

## 2024-03-29 ENCOUNTER — Ambulatory Visit (INDEPENDENT_AMBULATORY_CARE_PROVIDER_SITE_OTHER): Admitting: *Deleted

## 2024-03-29 DIAGNOSIS — Z23 Encounter for immunization: Secondary | ICD-10-CM | POA: Diagnosis not present

## 2024-03-30 ENCOUNTER — Ambulatory Visit (HOSPITAL_BASED_OUTPATIENT_CLINIC_OR_DEPARTMENT_OTHER): Admitting: Physical Therapy

## 2024-03-30 ENCOUNTER — Encounter (HOSPITAL_BASED_OUTPATIENT_CLINIC_OR_DEPARTMENT_OTHER): Payer: Self-pay | Admitting: Physical Therapy

## 2024-03-30 DIAGNOSIS — M25652 Stiffness of left hip, not elsewhere classified: Secondary | ICD-10-CM | POA: Diagnosis not present

## 2024-03-30 DIAGNOSIS — R262 Difficulty in walking, not elsewhere classified: Secondary | ICD-10-CM

## 2024-03-30 DIAGNOSIS — M25552 Pain in left hip: Secondary | ICD-10-CM | POA: Diagnosis not present

## 2024-03-30 DIAGNOSIS — M6281 Muscle weakness (generalized): Secondary | ICD-10-CM

## 2024-03-30 NOTE — Therapy (Addendum)
 OUTPATIENT PHYSICAL THERAPY LOWER EXTREMITY TREATMENT      Patient Name: Ellen [name] MRN: [id number] DOB:[birth year], 83 y.o., female Today's Date: 03/30/2024  END OF SESSION:  PT End of Session - 03/30/24 0908     Visit Number 17    Number of Visits 24    Date for Recertification  04/28/24    Authorization Type MCR A & B    PT Start Time 0852    PT Stop Time 0928    PT Time Calculation (min) 36 min    Activity Tolerance Patient tolerated treatment well    Behavior During Therapy [facility] for tasks assessed/performed                         Past Medical History:  Diagnosis Date   Allergy    Anxiety    Arthritis    Bursitis    left thigh   GERD (gastroesophageal reflux disease)    History of kidney stones    Hypertension    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   Left Buccal Cheek (Keratoacanthoma)   Umbilical hernia    Past Surgical History:  Procedure Laterality Date   BOWEL RESECTION  07/09/2013   Procedure: SMALL BOWEL RESECTION;  Surgeon: Krystal CHRISTELLA Spinner, MD;  Location: [facility];  Service: General;;   RODDIE NEVIN REPAIR Left 03/22/2023   Procedure: LEFT GLUTEUS MEDIUS REPAIR WITH POSSIBLE COLLAGEN PATCH AUGMENTATION;  Surgeon: Genelle Standing, MD;  Location: [facility];  Service: Orthopedics;  Laterality: Left;   GLUTEUS MINIMUS REPAIR Left 01/04/2024   Procedure: REPAIR, TENDON, GLUTEUS MINIMUS;  Surgeon: Genelle Standing, MD;  Location: [facility];  Service: Orthopedics;  Laterality: Left;  LEFT HIP GLUTEUS MAXIMUS TENDON TRANSFER WITH COLLAGEN PATCH AUGMENTATION   LAPAROTOMY N/A 07/09/2013   Procedure: EXPLORATORY LAPAROTOMY;  Surgeon: Krystal CHRISTELLA Spinner, MD;  Location: [facility];  Service: General;  Laterality: N/A;   SHOULDER ARTHROSCOPY WITH CAPSULORRHAPHY     Patient Active Problem List   Diagnosis Date Noted   B12 deficiency 07/30/2023   Skin ulcer of ankle, limited to breakdown of skin (HCC)  04/28/2023   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   Hypokalemia 12/27/2022   Volume depletion 12/24/2022   Constipation 12/24/2022   Partial small bowel obstruction (HCC) 12/24/2022   Polycythemia 12/24/2022   Gastroesophageal reflux disease 08/26/2022   Bloating 08/26/2022   Small intestinal bacterial overgrowth (SIBO) 08/26/2022   Osteoarthritis 12/01/2021   Plantar wart 12/01/2021   Ventral hernia 01/01/2021   Diverticulosis 01/05/2019   Dermatitis 01/05/2019   Systolic murmur 07/25/2018   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   Hyperglycemia 10/05/2017   Leg edema 10/05/2017   Hammer toe 10/05/2017   Hot flashes due to menopause 10/05/2017   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/10/2013 07/11/2013   Hypertension 07/09/2013    PCP: Kennyth Worth CHRISTELLA, MD  REFERRING PROVIDER: Genelle Standing, MD  REFERRING DIAG: Tendinopathy of gluteus medius [F32.040]   THERAPY DIAG:  Pain in left hip  Muscle weakness (generalized)  Stiffness of left hip, not elsewhere classified  Difficulty in walking, not elsewhere classified  Rationale for Evaluation and Treatment: Rehabilitation  ONSET DATE: 01/04/2024  SUBJECTIVE:   SUBJECTIVE STATEMENT:  Pt is 12 weeks and 2 days post op.  Pt saw Dr. Burnetta on Monday and had an US  guided injection at L SI.  Pt states MD informed her to cancel her PT appt the next  day, but to keep her PT appt on Thursday.  Pt states the injection helped.  Pt states her pain is not as severe though still present.  He wants to perform shockwave therapy next Monday.    Pt denies any adverse effects after prior treatment.  Pt reports compliance with HEP.     PERTINENT HISTORY: L hip gluteus maximus tendon transfer and trochanteric bursectomy on 01/04/24 Glute min repair, Laparotomy, Shoulder arthroscopy with capsulorrhaphy.   PAIN:  Are you having pain? Yes: NPRS scale: 3/10   Pain location: lateral and posterolateral L hip Pain  description: Constant pain Aggravating factors: Walking, standing, sitting, any hip flexion  Relieving factors: Ice.   PRECAUTIONS: Other: gute min, fall risk.  RED FLAGS: None   WEIGHT BEARING RESTRICTIONS: Yes WBAT  FALLS:  Has patient fallen in last 6 months? No  LIVING ENVIRONMENT: Lives with: lives with their spouse Lives in: House/apartment Stairs: Yes: Internal: 12 steps; on right going up and External: 4 steps; bilateral but cannot reach both Has following equipment at home: Single point cane, Walker - 2 wheeled, Environmental Consultant - 4 wheeled, and Grab bars  OCCUPATION: Human resources officer.   PLOF: Independent  PATIENT GOALS: Pt would like to walk straight.   NEXT MD VISIT: 01/19/2024  OBJECTIVE:  Note: Objective measures were completed at Evaluation unless otherwise noted.  DIAGNOSTIC FINDINGS: IMPRESSION: 1. Progressive fluid signal tracking between the gluteus minimus and within and along the gluteus medius muscle extending down to moderate gluteus medius tendinopathy and peritendinitis. Mild to moderate left gluteus minimus tendinopathy distally. 2. Trace left trochanteric bursitis. 3. Moderate degenerative hip arthropathy bilaterally. 4. Proximal hamstring tendinopathy and likely partial tearing similar to prior. 5. Substantial loss of intervertebral disc height at L4-5 and L5-S1 with type 2 degenerative endplate findings eccentric to the right at L4-5. 6. Intramural uterine fibroids. 7. Sigmoid colon diverticulosis.  PATIENT SURVEYS:  25/80  COGNITION: Overall cognitive status: Within functional limits for tasks assessed     SENSATION: WFL  EDEMA:  Localized swelling from surgery.   POSTURE: No Significant postural limitations  PALPATION: Tenderness to palpation at surgical site.   LOWER EXTREMITY ROM:  Passive ROM Right eval Left eval 02/26/24   Hip flexion   92  Hip extension     Hip abduction     Hip adduction     Hip internal rotation      Hip external rotation     Knee flexion     Knee extension     Ankle dorsiflexion     Ankle plantarflexion     Ankle inversion     Ankle eversion      (Blank rows = not tested)  LOWER EXTREMITY MMT:  MMT Right eval Left eval  Hip flexion    Hip extension    Hip abduction    Hip adduction    Hip internal rotation    Hip external rotation    Knee flexion    Knee extension    Ankle dorsiflexion    Ankle plantarflexion    Ankle inversion    Ankle eversion     (Blank rows = not tested)   GAIT: Distance walked: 60ft  Assistive device utilized: Walker - 2 wheeled Level of assistance: Complete Independence Comments: Slow, antalgic gait with PWB. (30% WB per pt).  TREATMENT DATE:  03/29/24 Nustep lvl 4 x 5 mins UE/LE's Supine bridge 2x10 Pt received L hip PROM in flexion, ER, and IR in supine per pt and tissue tolerance.  Q-ped rocking x 10 reps Marching on airex 2x10 with UE support PT attempted mini squats with UE support on rail though PT stopped due to pain Sit to stands approx 4 reps with table elevated Sidestepping x 3 laps at rail with UE support Stool rotations x 10 reps   03/23/24 Nustep lvl 5, 5 mins UE/LE's  Pt received L hip PROM in flexion, abd, ER, and IR per pt and tissue tolerance.   LAQ 3# 3x10 Step ups 4 inch step 2x10 Marching on airex 2x10 with UE support    03/20/24 Nustep lvl 5, 5 min UE/ LE's Step ups 2x10 in 4 inch step Mini squats with UE support on rail 2x10 Sidestepping x 3 laps at rail with UE support Marching on airex 2x10 with UE support Stool rotations x 10 Standing hip abduction x 15 with 2# bilat LAQ  2# x12, 3# 2x10  Pt received L hip PROM in flexion, ER, and IR per pt and tissue tolerance.    03/13/24: Nustep lvl 5, 5 min UE/ LE's Standing hip abduction with 2# x15 each leg  Standing hip  extension with 2# x15 each leg  Standing hip flexion with 2# x15 leg  Marching on airex pad to tolerance Step ups to 4 step with R LE only.  Marching on airex pad to tolerance Toe touch to cone  Marching over hurdles 6 laps    03/09/24: Nustep lvl 5, 5 min UE/ LE's LAQ with 2# 2x10  Standing hip abduction with 2# x15 each leg  Standing hip extension with 2# x15 each leg  Standing hip flexion with 2# x15 leg  Marching on airex pad to tolerance Step ups to 4 step with R LE only.    03/07/24 Nustep lvl 5, 5 min UE/ LE's Sit to stand  Side stepping at countertop x 6 laps  Standing hip abduction with 2# x15 each leg  Standing hip extension with 2# x15 each leg  Marching with 2# to tolerance Step up to 4 step.     PATIENT EDUCATION:  Education details: relevant anatomy, exercise form, diagnosis, post op limitations and restrictions, HEP,  and POC. Person educated: Patient Education method: Medical Illustrator, verbal cues Education comprehension: verbalized understanding, returned demonstration, verbal cues required, and tactile cues required  HOME EXERCISE PROGRAM: Access Code: [email or web address] URL: [email or web address] Date: 01/08/2024 Prepared by: Rojean Batten    ASSESSMENT:  CLINICAL IMPRESSION:  Pt received a SI injection on Monday and reports improved sx's.  PT performed hip PROM and she tolerated PROM well.  Pt attempted mini squats with UE support on rail though PT stopped due to pain.  PT had pt attempt sit to stands with table elevated though pt states she could still feel it.  PT had pt stop that exercises as well.  PT decreased intensity of exercise today due to recent injection.  Pt responded well to treatment reporting no change in pain after Rx, still 3/10.  She should benefit from continued skilled PT to address impairments and ongoing goals and to improve overall function.     OBJECTIVE IMPAIRMENTS: decreased activity tolerance,  difficulty walking, decreased balance, decreased endurance, decreased mobility, decreased ROM, decreased strength, impaired flexibility, impaired UE/LE use, postural dysfunction, and pain.  ACTIVITY LIMITATIONS: bending, lifting, carry, locomotion, cleaning, community activity,  driving, and or occupation  PERSONAL FACTORS: Age, second surgical procedure to same location, past medical history are also affecting patient's functional outcome.  REHAB POTENTIAL: Good  CLINICAL DECISION MAKING: Stable/uncomplicated  EVALUATION COMPLEXITY: Low    SHORT TERM GOALS: 01/28/24  Pt will be independent and compliant with HEP for improved pain, strength, and function. Baseline: Goal status: MET 9/3 Target date:      2.  Pt will demo improved quality of gait with increased stance time and toe off on L.   Baseline:  Goal status: IN PROGRESS 9/3 Target date:     3.  Pt will progress with exercises per protocol without adverse effects for improved strength and mobility.   Baseline:  Goal status: INITIAL Target date:       LONG TERM GOALS: Target date: 04/28/2024     Pt will be able to ambulate extended community distance without significant difficulty and pain.   Baseline:  Goal status: Ongoing 02/26/24    . 5 miles   2.  Pt will be able to perform her ADLs and IADLs including household chores without significant difficulty and pain. Baseline:  Goal status: Ongoing 02/26/24   3.  Pt will be able to take care of her husband without significant limitations.   Baseline:  Goal status: Met 03/13/24   4.  Pt will be able to perform mini squats with good form and without increased pain in order for improved functional LE strength and to assist with returning to gardening activities.   Baseline:  Goal status: Ongoing 03/13/24 Pt able to sit to stand to hi -low table without increased pain  Pt will report improved tolerance with standing activities and ambulation.  Baseline:  Goal status:  INITIAL Target date:       5.  Pt will ambulate with a normalized heel to toe gait without limping.  Baseline:  Goal status: INITIAL Target date:      6.  Pt will be able to perform a 6 inch step up with good form and control with L LE leading for improved performance of stairs abd and improved functional strength. Baseline:  Goal status: Ongoing 03/13/24, pt has pain with 6, but can handle 4  Target date:       PLAN:   PT FREQUENCY:  2x/wk    PT DURATION: 4 weeks    PLANNED INTERVENTIONS (unless contraindicated): aquatic PT, Canalith repositioning, cryotherapy, Electrical stimulation, Iontophoresis with 4 mg/ml dexamethasome, Moist heat, traction, Ultrasound, gait training, Therapeutic exercise, balance training, neuromuscular re-education, patient/family education, prosthetic training, manual techniques, passive ROM, dry needling, taping, vasopnuematic device, vestibular, spinal manipulations, joint manipulations  PLAN FOR NEXT SESSION:  Cont per Dr. Danetta Patch med repair protocol.   Leigh Minerva III PT, DPT 03/30/24 9:49 PM

## 2024-04-03 ENCOUNTER — Ambulatory Visit: Admitting: Sports Medicine

## 2024-04-03 ENCOUNTER — Encounter: Payer: Self-pay | Admitting: Sports Medicine

## 2024-04-03 DIAGNOSIS — M67959 Unspecified disorder of synovium and tendon, unspecified thigh: Secondary | ICD-10-CM | POA: Diagnosis not present

## 2024-04-03 DIAGNOSIS — G8929 Other chronic pain: Secondary | ICD-10-CM | POA: Diagnosis not present

## 2024-04-03 DIAGNOSIS — M7918 Myalgia, other site: Secondary | ICD-10-CM | POA: Diagnosis not present

## 2024-04-03 DIAGNOSIS — M25552 Pain in left hip: Secondary | ICD-10-CM

## 2024-04-03 NOTE — Progress Notes (Signed)
 Patient says that she got about 70% relief from the injection, and that relief lasted about 2 days. She says that after those 2 days, her pain returned gradually, and she now feels she is back to her original pain level. She does mention that she has edema, and is asking whether that may impact her pain. She denies any new pain or symptoms since her last visit.

## 2024-04-03 NOTE — Progress Notes (Addendum)
 Ellen [name] - 83 y.o. female MRN [id number]  Date of birth: August 28, 1940  Office Visit Note: Visit Date: 04/03/2024 PCP: Kennyth Worth HERO, MD Referred by: Kennyth Worth HERO, MD  Subjective: Chief Complaint  Patient presents with   Lower Back - Follow-up   HPI: Ellen [name] is a pleasant 83 y.o. female who presents today for follow-up of left-sided low back/SI-joint pain and lateral hip pain.  She is status post (2nd surgery) left hip gluteus maximus tendon transfer on 01/04/2024 with Dr. Genelle.  She is still recovering from this and progressing through PT.  He did think some of her pain was emanating from the SI joint, we did proceed with injection on 03/27/2024, she had fairly good relief but of this only lasted for a few days. More of her pain is still over the lateral side and in the buttock.  Continues on meloxicam  15 mg daily.  Pertinent ROS were reviewed with the patient and found to be negative unless otherwise specified above in HPI.   Assessment & Plan: Visit Diagnoses:  1. Tendinopathy of gluteus medius   2. Left buttock pain   3. Chronic left hip pain    Plan: Impression is chronic left posterior and lateral hip pain in the setting of previous gluteus maximus tendon transfer x 2. Ellen [name] has been persistent with her recovery and her PT but she is still having rather bothersome symptoms.  Due to her gluteal insufficiency, she does walk with a Trendelenburg gait, leaning towards the opposite side.  I think this is more of her functional SI joint pain opposed to true arthritic change.  I would like to plan for a trial of extracorporeal shockwave therapy, we discussed the nature of this today.  Unfortunately our shockwave machine is currently not in function.  Once this is resolved, we will perform 2 trials and see what sort of cumulative benefit she is receiving.  She may continue her meloxicam  15 mg daily in the interim as well as her PT/HEP.  Did discuss additional  treatment such as nitroglycerin patch protocol, will hold for this for now given uncommon but known side effect of lower extremity edema.  Meds & Orders: No orders of the defined types were placed in this encounter.  No orders of the defined types were placed in this encounter.    Procedures: No procedures performed      Clinical History: No specialty comments available.  She reports that she has quit smoking. Her smoking use included cigarettes. She has never used smokeless tobacco.  Recent Labs    06/28/23 1131  HGBA1C 5.4    Objective:    Physical Exam  Gen: Well-appearing, in no acute distress; non-toxic CV: Well-perfused. Warm.  Resp: Breathing unlabored on room air; no wheezing. Psych: Fluid speech in conversation; appropriate affect; normal thought process  Ortho Exam - Left hip: Tenderness over the left lateral side near the posterior aspect of the greater trochanteric region and gluteal tendons.  There is still a degree of asymmetric weakness on this side compared to the contralateral side. + Trendelenburg gait.  Imaging: No results found.  Past Medical/Family/Surgical/Social History: Medications & Allergies reviewed per EMR, new medications updated. 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   Hypokalemia 12/27/2022   Volume depletion 12/24/2022   Constipation 12/24/2022   Partial  small bowel obstruction (HCC) 12/24/2022   Polycythemia 12/24/2022   Gastroesophageal reflux disease 08/26/2022   Bloating 08/26/2022   Small intestinal bacterial overgrowth (SIBO) 08/26/2022   Osteoarthritis 12/01/2021   Plantar wart 12/01/2021   Ventral hernia 01/01/2021   Diverticulosis 01/05/2019   Dermatitis 01/05/2019   Systolic murmur 07/25/2018   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   Hyperglycemia 10/05/2017   Leg edema  10/05/2017   Hammer toe 10/05/2017   Hot flashes due to menopause 10/05/2017   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/10/2013 07/11/2013   Hypertension 07/09/2013   Past Medical History:  Diagnosis Date   Allergy    Anxiety    Arthritis    Bursitis    left thigh   GERD (gastroesophageal reflux disease)    History of kidney stones    Hypertension    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   Left Buccal Cheek (Keratoacanthoma)   Umbilical hernia    Family History  Problem Relation Age of Onset   Diabetes Mother    Heart attack Mother    CAD Father    Heart attack Father    Breast cancer Neg Hx    Colon cancer Neg Hx    Esophageal cancer Neg Hx    Rectal cancer Neg Hx    Stomach cancer Neg Hx    Past Surgical History:  Procedure Laterality Date   BOWEL RESECTION  07/09/2013   Procedure: SMALL BOWEL RESECTION;  Surgeon: Krystal CHRISTELLA Spinner, MD;  Location: [facility];  Service: General;;   RODDIE NEVIN REPAIR Left 03/22/2023   Procedure: LEFT GLUTEUS MEDIUS REPAIR WITH POSSIBLE COLLAGEN PATCH AUGMENTATION;  Surgeon: Genelle Standing, MD;  Location: [facility];  Service: Orthopedics;  Laterality: Left;   GLUTEUS MINIMUS REPAIR Left 01/04/2024   Procedure: REPAIR, TENDON, GLUTEUS MINIMUS;  Surgeon: Genelle Standing, MD;  Location: [facility];  Service: Orthopedics;  Laterality: Left;  LEFT HIP GLUTEUS MAXIMUS TENDON TRANSFER WITH COLLAGEN PATCH AUGMENTATION   LAPAROTOMY N/A 07/09/2013   Procedure: EXPLORATORY LAPAROTOMY;  Surgeon: Krystal CHRISTELLA Spinner, MD;  Location: [facility];  Service: General;  Laterality: N/A;   SHOULDER ARTHROSCOPY WITH CAPSULORRHAPHY     Social History   Occupational History   Occupation: Retired   Tobacco Use   Smoking status: Former    Types: Cigarettes   Smokeless tobacco: Never  Vaping Use   Vaping status: Never Used  Substance and Sexual Activity   Alcohol use: Yes    Alcohol/week: 7.0 standard  drinks of alcohol    Types: 7 Glasses of wine per week    Comment: nightly glass of wine   Drug use: No   Sexual activity: Not Currently

## 2024-04-03 NOTE — Addendum Note (Signed)
 Addended by: MARGRETTE LEIGH RAMAN on: 04/03/2024 12:48 PM   Modules accepted: Orders

## 2024-04-04 ENCOUNTER — Encounter (HOSPITAL_BASED_OUTPATIENT_CLINIC_OR_DEPARTMENT_OTHER): Payer: Self-pay | Admitting: Physical Therapy

## 2024-04-04 ENCOUNTER — Ambulatory Visit (HOSPITAL_BASED_OUTPATIENT_CLINIC_OR_DEPARTMENT_OTHER): Admitting: Physical Therapy

## 2024-04-04 DIAGNOSIS — M6281 Muscle weakness (generalized): Secondary | ICD-10-CM

## 2024-04-04 DIAGNOSIS — M25652 Stiffness of left hip, not elsewhere classified: Secondary | ICD-10-CM | POA: Diagnosis not present

## 2024-04-04 DIAGNOSIS — M25552 Pain in left hip: Secondary | ICD-10-CM

## 2024-04-04 DIAGNOSIS — R262 Difficulty in walking, not elsewhere classified: Secondary | ICD-10-CM

## 2024-04-04 NOTE — Therapy (Signed)
 OUTPATIENT PHYSICAL THERAPY LOWER EXTREMITY TREATMENT      Patient Name: Ellen [name] MRN: [id number] DOB:1941/04/30, 83 y.o., female Today's Date: 04/05/2024  END OF SESSION:  PT End of Session - 04/04/24 0854     Visit Number 18    Number of Visits 24    Date for Recertification  04/28/24    Authorization Type MCR A & B    PT Start Time 0851    PT Stop Time 0933    PT Time Calculation (min) 42 min    Activity Tolerance Patient tolerated treatment well    Behavior During Therapy [facility] for tasks assessed/performed                         Past Medical History:  Diagnosis Date   Allergy    Anxiety    Arthritis    Bursitis    left thigh   GERD (gastroesophageal reflux disease)    History of kidney stones    Hypertension    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   Left Buccal Cheek (Keratoacanthoma)   Umbilical hernia    Past Surgical History:  Procedure Laterality Date   BOWEL RESECTION  07/09/2013   Procedure: SMALL BOWEL RESECTION;  Surgeon: Krystal CHRISTELLA Spinner, MD;  Location: [facility];  Service: General;;   RODDIE NEVIN REPAIR Left 03/22/2023   Procedure: LEFT GLUTEUS MEDIUS REPAIR WITH POSSIBLE COLLAGEN PATCH AUGMENTATION;  Surgeon: Genelle Standing, MD;  Location: [facility];  Service: Orthopedics;  Laterality: Left;   GLUTEUS MINIMUS REPAIR Left 01/04/2024   Procedure: REPAIR, TENDON, GLUTEUS MINIMUS;  Surgeon: Genelle Standing, MD;  Location: [facility];  Service: Orthopedics;  Laterality: Left;  LEFT HIP GLUTEUS MAXIMUS TENDON TRANSFER WITH COLLAGEN PATCH AUGMENTATION   LAPAROTOMY N/A 07/09/2013   Procedure: EXPLORATORY LAPAROTOMY;  Surgeon: Krystal CHRISTELLA Spinner, MD;  Location: [facility];  Service: General;  Laterality: N/A;   SHOULDER ARTHROSCOPY WITH CAPSULORRHAPHY     Patient Active Problem List   Diagnosis Date Noted   B12 deficiency 07/30/2023   Skin ulcer of ankle, limited to breakdown of skin (HCC)  04/28/2023   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   Hypokalemia 12/27/2022   Volume depletion 12/24/2022   Constipation 12/24/2022   Partial small bowel obstruction (HCC) 12/24/2022   Polycythemia 12/24/2022   Gastroesophageal reflux disease 08/26/2022   Bloating 08/26/2022   Small intestinal bacterial overgrowth (SIBO) 08/26/2022   Osteoarthritis 12/01/2021   Plantar wart 12/01/2021   Ventral hernia 01/01/2021   Diverticulosis 01/05/2019   Dermatitis 01/05/2019   Systolic murmur 07/25/2018   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   Hyperglycemia 10/05/2017   Leg edema 10/05/2017   Hammer toe 10/05/2017   Hot flashes due to menopause 10/05/2017   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/10/2013 07/11/2013   Hypertension 07/09/2013    PCP: [place] CHRISTELLA, MD  REFERRING PROVIDER: Genelle Standing, MD  REFERRING DIAG: Tendinopathy of gluteus medius [F32.040]   THERAPY DIAG:  Pain in left hip  Muscle weakness (generalized)  Stiffness of left hip, not elsewhere classified  Difficulty in walking, not elsewhere classified  Rationale for Evaluation and Treatment: Rehabilitation  ONSET DATE: 01/04/2024  SUBJECTIVE:   SUBJECTIVE STATEMENT:  Pt is 13 weeks post op.  Pt was unable to received shockwave therapy yesterday due to the machine being broken.  She will receive shockwave therapy next Monday.  Pt denies any adverse effects after prior treatment.  Pt states the relief from the SI injection lasted 2.5 days.  Pt states she does her stairs a lot daily.  Most of the time, she does steps 1 at a time, and she goes down her steps backward.     PERTINENT HISTORY: L hip gluteus maximus tendon transfer and trochanteric bursectomy on 01/04/24 Glute min repair, Laparotomy, Shoulder arthroscopy with capsulorrhaphy.   PAIN:  Are you having pain? Yes: NPRS scale: 3/10   Pain location: lateral and posterolateral L hip Pain description: Constant pain Aggravating  factors: Walking, standing, sitting, any hip flexion  Relieving factors: Ice.   PRECAUTIONS: Other: gute min, fall risk.  RED FLAGS: None   WEIGHT BEARING RESTRICTIONS: Yes WBAT  FALLS:  Has patient fallen in last 6 months? No  LIVING ENVIRONMENT: Lives with: lives with their spouse Lives in: House/apartment Stairs: Yes: Internal: 12 steps; on right going up and External: 4 steps; bilateral but cannot reach both Has following equipment at home: Single point cane, Walker - 2 wheeled, Environmental Consultant - 4 wheeled, and Grab bars  OCCUPATION: Human resources officer.   PLOF: Independent  PATIENT GOALS: Pt would like to walk straight.   NEXT MD VISIT: 01/19/2024  OBJECTIVE:  Note: Objective measures were completed at Evaluation unless otherwise noted.  DIAGNOSTIC FINDINGS: IMPRESSION: 1. Progressive fluid signal tracking between the gluteus minimus and within and along the gluteus medius muscle extending down to moderate gluteus medius tendinopathy and peritendinitis. Mild to moderate left gluteus minimus tendinopathy distally. 2. Trace left trochanteric bursitis. 3. Moderate degenerative hip arthropathy bilaterally. 4. Proximal hamstring tendinopathy and likely partial tearing similar to prior. 5. Substantial loss of intervertebral disc height at L4-5 and L5-S1 with type 2 degenerative endplate findings eccentric to the right at L4-5. 6. Intramural uterine fibroids. 7. Sigmoid colon diverticulosis.  PATIENT SURVEYS:  25/80  COGNITION: Overall cognitive status: Within functional limits for tasks assessed     SENSATION: WFL  EDEMA:  Localized swelling from surgery.   POSTURE: No Significant postural limitations  PALPATION: Tenderness to palpation at surgical site.   LOWER EXTREMITY ROM:  Passive ROM Right eval Left eval 02/26/24   Hip flexion   92  Hip extension     Hip abduction     Hip adduction     Hip internal rotation     Hip external rotation     Knee  flexion     Knee extension     Ankle dorsiflexion     Ankle plantarflexion     Ankle inversion     Ankle eversion      (Blank rows = not tested)  LOWER EXTREMITY MMT:  MMT Right eval Left eval  Hip flexion    Hip extension    Hip abduction    Hip adduction    Hip internal rotation    Hip external rotation    Knee flexion    Knee extension    Ankle dorsiflexion    Ankle plantarflexion    Ankle inversion    Ankle eversion     (Blank rows = not tested)   GAIT: Distance walked: 60ft  Assistive device utilized: Walker - 2 wheeled Level of assistance: Complete Independence Comments: Slow, antalgic gait with PWB. (30% WB per pt).  TREATMENT DATE:  04/04/24 Nustep lvl 4-5 x 6 mins UE/LE's Mini squats with UE support on counter 2x10 Standing hip abduction 2#  2x15 bilat Step ups 4 inch 2x10 L LE, 1x10 R LE Lateral band walks with YTB above knees x 2 laps Marching on airex 2x15 with UE support Stool rotations 2x10 reps LAQ  3#   3x10 Supine bridge 2x10  03/29/24 Nustep lvl 4 x 5 mins UE/LE's Supine bridge 2x10 Pt received L hip PROM in flexion, ER, and IR in supine per pt and tissue tolerance.  Q-ped rocking x 10 reps Marching on airex 2x10 with UE support PT attempted mini squats with UE support on rail though PT stopped due to pain Sit to stands approx 4 reps with table elevated Sidestepping x 3 laps at rail with UE support Stool rotations x 10 reps   03/23/24 Nustep lvl 5, 5 mins UE/LE's  Pt received L hip PROM in flexion, abd, ER, and IR per pt and tissue tolerance.   LAQ 3# 3x10 Step ups 4 inch step 2x10 Marching on airex 2x10 with UE support    03/20/24 Nustep lvl 5, 5 min UE/ LE's Step ups 2x10 in 4 inch step Mini squats with UE support on rail 2x10 Sidestepping x 3 laps at rail with UE support Marching on airex 2x10 with  UE support Stool rotations x 10 Standing hip abduction x 15 with 2# bilat LAQ  2# x12, 3# 2x10  Pt received L hip PROM in flexion, ER, and IR per pt and tissue tolerance.    03/13/24: Nustep lvl 5, 5 min UE/ LE's Standing hip abduction with 2# x15 each leg  Standing hip extension with 2# x15 each leg  Standing hip flexion with 2# x15 leg  Marching on airex pad to tolerance Step ups to 4 step with R LE only.  Marching on airex pad to tolerance Toe touch to cone  Marching over hurdles 6 laps    03/09/24: Nustep lvl 5, 5 min UE/ LE's LAQ with 2# 2x10  Standing hip abduction with 2# x15 each leg  Standing hip extension with 2# x15 each leg  Standing hip flexion with 2# x15 leg  Marching on airex pad to tolerance Step ups to 4 step with R LE only.       PATIENT EDUCATION:  Education details: relevant anatomy, exercise form, diagnosis, post op limitations and restrictions, HEP,  and POC. Person educated: Patient Education method: Medical Illustrator, verbal cues Education comprehension: verbalized understanding, returned demonstration, verbal cues required, and tactile cues required  HOME EXERCISE PROGRAM: Access Code: [email or web address] URL: [email or web address] Date: 01/08/2024 Prepared by: Rojean Batten    ASSESSMENT:  CLINICAL IMPRESSION:  Pt reports she only received 2.5 days of relief from the SI injection.  Pt performed exercises per protocol to improve LE and functional strength and functional mobility.  She tolerated exercises well.  Pt reports she could feel her hip with step ups on a 4 inch step.  She responded well to treatment reporting no increased pain after Rx.  She should benefit from continued skilled PT to address impairments and ongoing goals and to improve overall function.     OBJECTIVE IMPAIRMENTS: decreased activity tolerance, difficulty walking, decreased balance, decreased endurance, decreased mobility, decreased ROM, decreased  strength, impaired flexibility, impaired UE/LE use, postural dysfunction, and pain.  ACTIVITY LIMITATIONS: bending, lifting, carry, locomotion, cleaning, community activity, driving, and or occupation  PERSONAL FACTORS: Age, second surgical procedure to same location, past  medical history are also affecting patient's functional outcome.  REHAB POTENTIAL: Good  CLINICAL DECISION MAKING: Stable/uncomplicated  EVALUATION COMPLEXITY: Low    SHORT TERM GOALS: 01/28/24  Pt will be independent and compliant with HEP for improved pain, strength, and function. Baseline: Goal status: MET 9/3 Target date:      2.  Pt will demo improved quality of gait with increased stance time and toe off on L.   Baseline:  Goal status: IN PROGRESS 9/3 Target date:     3.  Pt will progress with exercises per protocol without adverse effects for improved strength and mobility.   Baseline:  Goal status: INITIAL Target date:       LONG TERM GOALS: Target date: 04/28/2024     Pt will be able to ambulate extended community distance without significant difficulty and pain.   Baseline:  Goal status: Ongoing 02/26/24    . 5 miles   2.  Pt will be able to perform her ADLs and IADLs including household chores without significant difficulty and pain. Baseline:  Goal status: Ongoing 02/26/24   3.  Pt will be able to take care of her husband without significant limitations.   Baseline:  Goal status: Met 03/13/24   4.  Pt will be able to perform mini squats with good form and without increased pain in order for improved functional LE strength and to assist with returning to gardening activities.   Baseline:  Goal status: Ongoing 03/13/24 Pt able to sit to stand to hi -low table without increased pain  Pt will report improved tolerance with standing activities and ambulation.  Baseline:  Goal status: INITIAL Target date:       5.  Pt will ambulate with a normalized heel to toe gait without limping.   Baseline:  Goal status: INITIAL Target date:      6.  Pt will be able to perform a 6 inch step up with good form and control with L LE leading for improved performance of stairs abd and improved functional strength. Baseline:  Goal status: Ongoing 03/13/24, pt has pain with 6, but can handle 4  Target date:       PLAN:   PT FREQUENCY:  2x/wk    PT DURATION: 4 weeks    PLANNED INTERVENTIONS (unless contraindicated): aquatic PT, Canalith repositioning, cryotherapy, Electrical stimulation, Iontophoresis with 4 mg/ml dexamethasome, Moist heat, traction, Ultrasound, gait training, Therapeutic exercise, balance training, neuromuscular re-education, patient/family education, prosthetic training, manual techniques, passive ROM, dry needling, taping, vasopnuematic device, vestibular, spinal manipulations, joint manipulations  PLAN FOR NEXT SESSION:  Cont per Dr. Danetta Patch med repair protocol.   Leigh Minerva III PT, DPT 04/05/24 5:35 PM

## 2024-04-06 ENCOUNTER — Ambulatory Visit (HOSPITAL_BASED_OUTPATIENT_CLINIC_OR_DEPARTMENT_OTHER): Admitting: Physical Therapy

## 2024-04-06 ENCOUNTER — Encounter (HOSPITAL_BASED_OUTPATIENT_CLINIC_OR_DEPARTMENT_OTHER): Payer: Self-pay | Admitting: Physical Therapy

## 2024-04-06 DIAGNOSIS — R262 Difficulty in walking, not elsewhere classified: Secondary | ICD-10-CM | POA: Diagnosis not present

## 2024-04-06 DIAGNOSIS — M25652 Stiffness of left hip, not elsewhere classified: Secondary | ICD-10-CM

## 2024-04-06 DIAGNOSIS — M6281 Muscle weakness (generalized): Secondary | ICD-10-CM | POA: Diagnosis not present

## 2024-04-06 DIAGNOSIS — M25552 Pain in left hip: Secondary | ICD-10-CM

## 2024-04-06 NOTE — Therapy (Signed)
 OUTPATIENT PHYSICAL THERAPY LOWER EXTREMITY TREATMENT      Patient Name: Ellen [name] MRN: [id number] DOB:1941-04-09, 83 y.o., female Today's Date: 04/07/2024  END OF SESSION:  PT End of Session - 04/06/24 0858     Visit Number 19    Number of Visits 24    Date for Recertification  04/28/24    Authorization Type MCR A & B    PT Start Time 0853    PT Stop Time 0936    PT Time Calculation (min) 43 min    Activity Tolerance Patient tolerated treatment well    Behavior During Therapy [facility] for tasks assessed/performed                          Past Medical History:  Diagnosis Date   Allergy    Anxiety    Arthritis    Bursitis    left thigh   GERD (gastroesophageal reflux disease)    History of kidney stones    Hypertension    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   Left Buccal Cheek (Keratoacanthoma)   Umbilical hernia    Past Surgical History:  Procedure Laterality Date   BOWEL RESECTION  07/09/2013   Procedure: SMALL BOWEL RESECTION;  Surgeon: Krystal CHRISTELLA Spinner, MD;  Location: [facility];  Service: General;;   RODDIE NEVIN REPAIR Left 03/22/2023   Procedure: LEFT GLUTEUS MEDIUS REPAIR WITH POSSIBLE COLLAGEN PATCH AUGMENTATION;  Surgeon: Genelle Standing, MD;  Location: [facility];  Service: Orthopedics;  Laterality: Left;   GLUTEUS MINIMUS REPAIR Left 01/04/2024   Procedure: REPAIR, TENDON, GLUTEUS MINIMUS;  Surgeon: Genelle Standing, MD;  Location: [facility];  Service: Orthopedics;  Laterality: Left;  LEFT HIP GLUTEUS MAXIMUS TENDON TRANSFER WITH COLLAGEN PATCH AUGMENTATION   LAPAROTOMY N/A 07/09/2013   Procedure: EXPLORATORY LAPAROTOMY;  Surgeon: Krystal CHRISTELLA Spinner, MD;  Location: [facility];  Service: General;  Laterality: N/A;   SHOULDER ARTHROSCOPY WITH CAPSULORRHAPHY     Patient Active Problem List   Diagnosis Date Noted   B12 deficiency 07/30/2023   Skin ulcer of ankle, limited to breakdown of skin (HCC)  04/28/2023   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   Hypokalemia 12/27/2022   Volume depletion 12/24/2022   Constipation 12/24/2022   Partial small bowel obstruction (HCC) 12/24/2022   Polycythemia 12/24/2022   Gastroesophageal reflux disease 08/26/2022   Bloating 08/26/2022   Small intestinal bacterial overgrowth (SIBO) 08/26/2022   Osteoarthritis 12/01/2021   Plantar wart 12/01/2021   Ventral hernia 01/01/2021   Diverticulosis 01/05/2019   Dermatitis 01/05/2019   Systolic murmur 07/25/2018   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   Hyperglycemia 10/05/2017   Leg edema 10/05/2017   Hammer toe 10/05/2017   Hot flashes due to menopause 10/05/2017   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/10/2013 07/11/2013   Hypertension 07/09/2013    PCP: Kennyth Worth CHRISTELLA, MD  REFERRING PROVIDER: Genelle Standing, MD  REFERRING DIAG: Tendinopathy of gluteus medius [F32.040]   THERAPY DIAG:  Pain in left hip  Muscle weakness (generalized)  Stiffness of left hip, not elsewhere classified  Difficulty in walking, not elsewhere classified  Rationale for Evaluation and Treatment: Rehabilitation  ONSET DATE: 01/04/2024  SUBJECTIVE:   SUBJECTIVE STATEMENT:  Pt is 13 weeks and 2 days post op.  Pt states she had increased pain after prior treatment.  Pt states her steps at home are 9 inches.  Most of the time, she does steps  1 at a time, and she goes down her steps backwards or sideways.  Pt reports she walked around the block yesterday with her staff, which is the 1st time in a year.  Pt had no adverse effects with walking her block.  Pt then mowed her backyard.      PERTINENT HISTORY: L hip gluteus maximus tendon transfer and trochanteric bursectomy on 01/04/24 Glute min repair, Laparotomy, Shoulder arthroscopy with capsulorrhaphy.   PAIN:  Are you having pain? Yes: NPRS scale: 3/10   Pain location: lateral and posterolateral L hip Pain description: Constant  pain Aggravating factors: Walking, standing, sitting, any hip flexion  Relieving factors: Ice.   PRECAUTIONS: Other: gute min, fall risk.  RED FLAGS: None   WEIGHT BEARING RESTRICTIONS: Yes WBAT  FALLS:  Has patient fallen in last 6 months? No  LIVING ENVIRONMENT: Lives with: lives with their spouse Lives in: House/apartment Stairs: Yes: Internal: 12 steps; on right going up and External: 4 steps; bilateral but cannot reach both Has following equipment at home: Single point cane, Walker - 2 wheeled, Environmental Consultant - 4 wheeled, and Grab bars  OCCUPATION: Human resources officer.   PLOF: Independent  PATIENT GOALS: Pt would like to walk straight.   NEXT MD VISIT: 01/19/2024  OBJECTIVE:  Note: Objective measures were completed at Evaluation unless otherwise noted.  DIAGNOSTIC FINDINGS: IMPRESSION: 1. Progressive fluid signal tracking between the gluteus minimus and within and along the gluteus medius muscle extending down to moderate gluteus medius tendinopathy and peritendinitis. Mild to moderate left gluteus minimus tendinopathy distally. 2. Trace left trochanteric bursitis. 3. Moderate degenerative hip arthropathy bilaterally. 4. Proximal hamstring tendinopathy and likely partial tearing similar to prior. 5. Substantial loss of intervertebral disc height at L4-5 and L5-S1 with type 2 degenerative endplate findings eccentric to the right at L4-5. 6. Intramural uterine fibroids. 7. Sigmoid colon diverticulosis.  PATIENT SURVEYS:  25/80  COGNITION: Overall cognitive status: Within functional limits for tasks assessed     SENSATION: WFL  EDEMA:  Localized swelling from surgery.   POSTURE: No Significant postural limitations  PALPATION: Tenderness to palpation at surgical site.   LOWER EXTREMITY ROM:  Passive ROM Right eval Left eval 02/26/24   Hip flexion   92  Hip extension     Hip abduction     Hip adduction     Hip internal rotation     Hip external  rotation     Knee flexion     Knee extension     Ankle dorsiflexion     Ankle plantarflexion     Ankle inversion     Ankle eversion      (Blank rows = not tested)  LOWER EXTREMITY MMT:  MMT Right eval Left eval  Hip flexion    Hip extension    Hip abduction    Hip adduction    Hip internal rotation    Hip external rotation    Knee flexion    Knee extension    Ankle dorsiflexion    Ankle plantarflexion    Ankle inversion    Ankle eversion     (Blank rows = not tested)   GAIT: Distance walked: 86ft  Assistive device utilized: Walker - 2 wheeled Level of assistance: Complete Independence Comments: Slow, antalgic gait with PWB. (30% WB per pt).  TREATMENT DATE:  04/06/24 Nustep lvl 5 x 5 mins UE/LE's Step ups 4 inch x10, 6 inch 1x8 Marching on airex 2x15 with UE support Mini squats with UE support on rail 2x10 Lateral band walks with YTB above knees x 2 laps Standing hip abduction 2#  2x15 bilat Step downs x 10 reps each on 2 inch and 4 inch step with UE support LAQ  3#   2x15  Pt received L hip PROM in flexion, ER, and IR in supine.  04/04/24 Nustep lvl 4-5 x 6 mins UE/LE's Mini squats with UE support on counter 2x10 Standing hip abduction 2#  2x15 bilat Step ups 4 inch 2x10 L LE, 1x10 R LE Lateral band walks with YTB above knees x 2 laps Marching on airex 2x15 with UE support Stool rotations 2x10 reps LAQ  3#   3x10 Supine bridge 2x10  03/29/24 Nustep lvl 4 x 5 mins UE/LE's Supine bridge 2x10 Pt received L hip PROM in flexion, ER, and IR in supine per pt and tissue tolerance.  Q-ped rocking x 10 reps Marching on airex 2x10 with UE support PT attempted mini squats with UE support on rail though PT stopped due to pain Sit to stands approx 4 reps with table elevated Sidestepping x 3 laps at rail with UE support Stool rotations  x 10 reps   03/23/24 Nustep lvl 5, 5 mins UE/LE's  Pt received L hip PROM in flexion, abd, ER, and IR per pt and tissue tolerance.   LAQ 3# 3x10 Step ups 4 inch step 2x10 Marching on airex 2x10 with UE support    03/20/24 Nustep lvl 5, 5 min UE/ LE's Step ups 2x10 in 4 inch step Mini squats with UE support on rail 2x10 Sidestepping x 3 laps at rail with UE support Marching on airex 2x10 with UE support Stool rotations x 10 Standing hip abduction x 15 with 2# bilat LAQ  2# x12, 3# 2x10  Pt received L hip PROM in flexion, ER, and IR per pt and tissue tolerance.    03/13/24: Nustep lvl 5, 5 min UE/ LE's Standing hip abduction with 2# x15 each leg  Standing hip extension with 2# x15 each leg  Standing hip flexion with 2# x15 leg  Marching on airex pad to tolerance Step ups to 4 step with R LE only.  Marching on airex pad to tolerance Toe touch to cone  Marching over hurdles 6 laps    03/09/24: Nustep lvl 5, 5 min UE/ LE's LAQ with 2# 2x10  Standing hip abduction with 2# x15 each leg  Standing hip extension with 2# x15 each leg  Standing hip flexion with 2# x15 leg  Marching on airex pad to tolerance Step ups to 4 step with R LE only.       PATIENT EDUCATION:  Education details: relevant anatomy, exercise form, diagnosis, post op limitations and restrictions, HEP,  and POC. Person educated: Patient Education method: Medical Illustrator, verbal cues Education comprehension: verbalized understanding, returned demonstration, verbal cues required, and tactile cues required  HOME EXERCISE PROGRAM: Access Code: [email or web address] URL: [email or web address] Date: 01/08/2024 Prepared by: Rojean Batten    ASSESSMENT:  CLINICAL IMPRESSION:  Pt demonstrates improved functional mobility as evidenced by subjective report.  She reports she walked around the block yesterday with her staff without any adverse effects.  She has not done that in a year.  Pt  continues to have difficulty with performing stairs.  PT worked on step activities today  to improve strength and stair performance.  PT increased height of fwd step ups to 6 inches and she had pain.  Pt able to perform step downs on a 4 inch step with UE support on rail.  Pt tolerated treatment well and reports increased pain from 3/10 before treatment to 4/10 after treatment.  She should benefit from continued skilled PT to address impairments and ongoing goals and to improve overall function.     OBJECTIVE IMPAIRMENTS: decreased activity tolerance, difficulty walking, decreased balance, decreased endurance, decreased mobility, decreased ROM, decreased strength, impaired flexibility, impaired UE/LE use, postural dysfunction, and pain.  ACTIVITY LIMITATIONS: bending, lifting, carry, locomotion, cleaning, community activity, driving, and or occupation  PERSONAL FACTORS: Age, second surgical procedure to same location, past medical history are also affecting patient's functional outcome.  REHAB POTENTIAL: Good  CLINICAL DECISION MAKING: Stable/uncomplicated  EVALUATION COMPLEXITY: Low    SHORT TERM GOALS: 01/28/24  Pt will be independent and compliant with HEP for improved pain, strength, and function. Baseline: Goal status: MET 9/3 Target date:      2.  Pt will demo improved quality of gait with increased stance time and toe off on L.   Baseline:  Goal status: IN PROGRESS 9/3 Target date:     3.  Pt will progress with exercises per protocol without adverse effects for improved strength and mobility.   Baseline:  Goal status: INITIAL Target date:       LONG TERM GOALS: Target date: 04/28/2024     Pt will be able to ambulate extended community distance without significant difficulty and pain.   Baseline:  Goal status: Ongoing 02/26/24    . 5 miles   2.  Pt will be able to perform her ADLs and IADLs including household chores without significant difficulty and pain. Baseline:   Goal status: Ongoing 02/26/24   3.  Pt will be able to take care of her husband without significant limitations.   Baseline:  Goal status: Met 03/13/24   4.  Pt will be able to perform mini squats with good form and without increased pain in order for improved functional LE strength and to assist with returning to gardening activities.   Baseline:  Goal status: Ongoing 03/13/24 Pt able to sit to stand to hi -low table without increased pain  Pt will report improved tolerance with standing activities and ambulation.  Baseline:  Goal status: INITIAL Target date:       5.  Pt will ambulate with a normalized heel to toe gait without limping.  Baseline:  Goal status: INITIAL Target date:      6.  Pt will be able to perform a 6 inch step up with good form and control with L LE leading for improved performance of stairs abd and improved functional strength. Baseline:  Goal status: Ongoing 03/13/24, pt has pain with 6, but can handle 4  Target date:       PLAN:   PT FREQUENCY:  2x/wk    PT DURATION: 4 weeks    PLANNED INTERVENTIONS (unless contraindicated): aquatic PT, Canalith repositioning, cryotherapy, Electrical stimulation, Iontophoresis with 4 mg/ml dexamethasome, Moist heat, traction, Ultrasound, gait training, Therapeutic exercise, balance training, neuromuscular re-education, patient/family education, prosthetic training, manual techniques, passive ROM, dry needling, taping, vasopnuematic device, vestibular, spinal manipulations, joint manipulations  PLAN FOR NEXT SESSION:  Cont per Dr. Danetta Patch med repair protocol.   Leigh Minerva III PT, DPT 04/07/24 10:46 AM

## 2024-04-10 ENCOUNTER — Ambulatory Visit (HOSPITAL_BASED_OUTPATIENT_CLINIC_OR_DEPARTMENT_OTHER)

## 2024-04-10 ENCOUNTER — Ambulatory Visit: Admitting: Sports Medicine

## 2024-04-10 ENCOUNTER — Encounter (HOSPITAL_BASED_OUTPATIENT_CLINIC_OR_DEPARTMENT_OTHER): Payer: Self-pay

## 2024-04-10 ENCOUNTER — Encounter: Payer: Self-pay | Admitting: Sports Medicine

## 2024-04-10 DIAGNOSIS — M25552 Pain in left hip: Secondary | ICD-10-CM

## 2024-04-10 DIAGNOSIS — M67959 Unspecified disorder of synovium and tendon, unspecified thigh: Secondary | ICD-10-CM | POA: Diagnosis not present

## 2024-04-10 DIAGNOSIS — G8929 Other chronic pain: Secondary | ICD-10-CM | POA: Diagnosis not present

## 2024-04-10 DIAGNOSIS — R262 Difficulty in walking, not elsewhere classified: Secondary | ICD-10-CM

## 2024-04-10 DIAGNOSIS — M7632 Iliotibial band syndrome, left leg: Secondary | ICD-10-CM

## 2024-04-10 DIAGNOSIS — M25652 Stiffness of left hip, not elsewhere classified: Secondary | ICD-10-CM

## 2024-04-10 DIAGNOSIS — M6281 Muscle weakness (generalized): Secondary | ICD-10-CM

## 2024-04-10 NOTE — Progress Notes (Signed)
 Ellen [name] - 83 y.o. female MRN [id number]  Date of birth: 06-01-1940  Office Visit Note: Visit Date: 04/10/2024 PCP: Kennyth Worth HERO, MD Referred by: Kennyth Worth HERO, MD  Subjective: Chief Complaint  Patient presents with   Left Hip - Pain   HPI: Ellen [name] is a pleasant 83 y.o. female who presents today for acute on chronic left lateral hip pain.  She is status post (2nd surgery) left hip gluteus maximus tendon transfer on 01/04/2024 with Dr. Genelle.  She is still recovering from this and progressing through PT. she did make measurable effect, 35% improvement on her last PT evaluation.  She would like to move forward with shockwave trial today.  Pertinent ROS were reviewed with the patient and found to be negative unless otherwise specified above in HPI.   Assessment & Plan: Visit Diagnoses:  1. Chronic left hip pain   2. Tendinopathy of gluteus medius   3. It band syndrome, left    Plan: Impression is chronic left lateral hip pain in the setting of previous gluteus maximus tendon transfer x 2.  She does have GT PS as well as a degree of proximal IT band syndrome.  We did perform our first trial of extracorporeal shockwave therapy, patient tolerated well.  Advised on activity modification, fine to continue physical therapy and her own home exercises starting tomorrow.  She may continue meloxicam  15 mg daily as needed.  I would like to perform an additional 1-2 treatments as a trial and then see what sort of cumulative benefit she has going forward. F/u next Tuesday for re-evaluation and repeat treatment.  Follow-up: Return in about 8 days (around 04/18/2024).   Meds & Orders: No orders of the defined types were placed in this encounter.  No orders of the defined types were placed in this encounter.    Procedures: Procedure: ECSWT Indications: Greater trochanteric pain syndrome, IT band syndrome   Procedure Details Consent: Risks of procedure as well as the  alternatives and risks of each were explained to the patient.  Verbal consent for procedure obtained. Time Out: Verified patient identification, verified procedure, site was marked, verified correct patient position. The area was cleaned with alcohol swab.     The left greater trochanteric region, gluteal insertion and proximal IT band was targeted for Extracorporeal shockwave therapy.    Preset: Trochanteric bursitis/tendinopathy Power Level: 110 mJ Frequency: 12 Hz Impulse/cycles: 2000 Head size: Regular  *IT-Band Power Level: 90 mJ Frequency: 10 Hz Impulse/cycles: 1500 Head size: Regular   Patient tolerated procedure well without immediate complications.         Clinical History: No specialty comments available.  She reports that she has quit smoking. Her smoking use included cigarettes. She has never used smokeless tobacco.  Recent Labs    06/28/23 1131  HGBA1C 5.4    Objective:    Physical Exam  Gen: Well-appearing, in no acute distress; non-toxic CV: Well-perfused. Warm.  Resp: Breathing unlabored on room air; no wheezing. Psych: Fluid speech in conversation; appropriate affect; normal thought process  Ortho Exam - Left hip: There is tenderness over the lateral aspect of the greater trochanteric region and insertion of the gluteal tendons.  There is a degree of fascial restriction and tenderness over the proximal to mid IT band.  No significant swelling.  Imaging: No results found.  Past Medical/Family/Surgical/Social History: Medications & Allergies reviewed per EMR, new medications updated. 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   Hypokalemia 12/27/2022   Volume depletion 12/24/2022   Constipation 12/24/2022   Partial small bowel obstruction (HCC) 12/24/2022   Polycythemia 12/24/2022   Gastroesophageal  reflux disease 08/26/2022   Bloating 08/26/2022   Small intestinal bacterial overgrowth (SIBO) 08/26/2022   Osteoarthritis 12/01/2021   Plantar wart 12/01/2021   Ventral hernia 01/01/2021   Diverticulosis 01/05/2019   Dermatitis 01/05/2019   Systolic murmur 07/25/2018   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   Hyperglycemia 10/05/2017   Leg edema 10/05/2017   Hammer toe 10/05/2017   Hot flashes due to menopause 10/05/2017   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/10/2013 07/11/2013   Hypertension 07/09/2013   Past Medical History:  Diagnosis Date   Allergy    Anxiety    Arthritis    Bursitis    left thigh   GERD (gastroesophageal reflux disease)    History of kidney stones    Hypertension    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   Left Buccal Cheek (Keratoacanthoma)   Umbilical hernia    Family History  Problem Relation Age of Onset   Diabetes Mother    Heart attack Mother    CAD Father    Heart attack Father    Breast cancer Neg Hx    Colon cancer Neg Hx    Esophageal cancer Neg Hx    Rectal cancer Neg Hx    Stomach cancer Neg Hx    Past Surgical History:  Procedure Laterality Date   BOWEL RESECTION  07/09/2013   Procedure: SMALL BOWEL RESECTION;  Surgeon: Krystal CHRISTELLA Spinner, MD;  Location: [facility];  Service: General;;   RODDIE NEVIN REPAIR Left 03/22/2023   Procedure: LEFT GLUTEUS MEDIUS REPAIR WITH POSSIBLE COLLAGEN PATCH AUGMENTATION;  Surgeon: Genelle Standing, MD;  Location: [facility];  Service: Orthopedics;  Laterality: Left;   GLUTEUS MINIMUS REPAIR Left 01/04/2024   Procedure: REPAIR, TENDON, GLUTEUS MINIMUS;  Surgeon: Genelle Standing, MD;  Location: [facility];  Service: Orthopedics;  Laterality: Left;  LEFT HIP GLUTEUS MAXIMUS TENDON TRANSFER WITH COLLAGEN PATCH AUGMENTATION   LAPAROTOMY N/A 07/09/2013   Procedure: EXPLORATORY LAPAROTOMY;  Surgeon: Krystal CHRISTELLA Spinner, MD;  Location: [facility];  Service: General;   Laterality: N/A;   SHOULDER ARTHROSCOPY WITH CAPSULORRHAPHY     Social History   Occupational History   Occupation: Retired   Tobacco Use   Smoking status: Former    Types: Cigarettes   Smokeless tobacco: Never  Vaping Use   Vaping status: Never Used  Substance and Sexual Activity   Alcohol use: Yes    Alcohol/week: 7.0 standard drinks of alcohol    Types: 7 Glasses of wine per week    Comment: nightly glass of wine   Drug use: No   Sexual activity: Not Currently

## 2024-04-10 NOTE — Therapy (Cosign Needed)
 OUTPATIENT PHYSICAL THERAPY LOWER EXTREMITY TREATMENT Progress Note Reporting Period 03/30/24 to 04/10/24  See note below for Objective Data and Assessment of Progress/Goals.         Patient Name: Ellen [name] MRN: [id number] DOB:1940/10/01, 83 y.o., female Today's Date: 04/10/2024  END OF SESSION:  PT End of Session - 04/10/24 0841     Visit Number 20    Number of Visits 24    Date for Recertification  04/28/24    Authorization Type MCR A & B    Progress Note Due on Visit 20    PT Start Time 0847    PT Stop Time 0930    PT Time Calculation (min) 43 min    Activity Tolerance Patient tolerated treatment well    Behavior During Therapy [facility] for tasks assessed/performed                           Past Medical History:  Diagnosis Date   Allergy    Anxiety    Arthritis    Bursitis    left thigh   GERD (gastroesophageal reflux disease)    History of kidney stones    Hypertension    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   Left Buccal Cheek (Keratoacanthoma)   Umbilical hernia    Past Surgical History:  Procedure Laterality Date   BOWEL RESECTION  07/09/2013   Procedure: SMALL BOWEL RESECTION;  Surgeon: Krystal CHRISTELLA Spinner, MD;  Location: [facility];  Service: General;;   RODDIE NEVIN REPAIR Left 03/22/2023   Procedure: LEFT GLUTEUS MEDIUS REPAIR WITH POSSIBLE COLLAGEN PATCH AUGMENTATION;  Surgeon: Genelle Standing, MD;  Location: [facility];  Service: Orthopedics;  Laterality: Left;   GLUTEUS MINIMUS REPAIR Left 01/04/2024   Procedure: REPAIR, TENDON, GLUTEUS MINIMUS;  Surgeon: Genelle Standing, MD;  Location: [facility];  Service: Orthopedics;  Laterality: Left;  LEFT HIP GLUTEUS MAXIMUS TENDON TRANSFER WITH COLLAGEN PATCH AUGMENTATION   LAPAROTOMY N/A 07/09/2013   Procedure: EXPLORATORY LAPAROTOMY;  Surgeon: Krystal CHRISTELLA Spinner, MD;  Location: [facility];  Service: General;  Laterality: N/A;   SHOULDER ARTHROSCOPY WITH  CAPSULORRHAPHY     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   Hypokalemia 12/27/2022   Volume depletion 12/24/2022   Constipation 12/24/2022   Partial small bowel obstruction (HCC) 12/24/2022   Polycythemia 12/24/2022   Gastroesophageal reflux disease 08/26/2022   Bloating 08/26/2022   Small intestinal bacterial overgrowth (SIBO) 08/26/2022   Osteoarthritis 12/01/2021   Plantar wart 12/01/2021   Ventral hernia 01/01/2021   Diverticulosis 01/05/2019   Dermatitis 01/05/2019   Systolic murmur 07/25/2018   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   Hyperglycemia 10/05/2017   Leg edema 10/05/2017   Hammer toe 10/05/2017   Hot flashes due to menopause 10/05/2017   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/10/2013 07/11/2013   Hypertension 07/09/2013    PCP: [place] CHRISTELLA, MD  REFERRING PROVIDER: Genelle Standing, MD  REFERRING DIAG: Tendinopathy of gluteus medius [F32.040]   THERAPY DIAG:  Pain in left hip  Muscle weakness (generalized)  Stiffness of left hip, not elsewhere classified  Difficulty in walking, not elsewhere classified  Rationale for Evaluation and Treatment: Rehabilitation  ONSET DATE: 01/04/2024  SUBJECTIVE:   SUBJECTIVE STATEMENT:  Pt reports increased soreness and stiffness  today following yard work this weekend. 5-6/10 pain level at entry.     PERTINENT HISTORY: L hip gluteus maximus tendon transfer and trochanteric bursectomy on 01/04/24 Glute min repair, Laparotomy, Shoulder arthroscopy with capsulorrhaphy.   PAIN:  Are you having pain? Yes: NPRS scale: 5-6/10   Pain location: lateral and posterolateral L hip Pain description: Constant pain Aggravating factors: Walking, standing, sitting, any hip flexion  Relieving factors: Ice.   PRECAUTIONS: Other: gute min, fall risk.  RED  FLAGS: None   WEIGHT BEARING RESTRICTIONS: Yes WBAT  FALLS:  Has patient fallen in last 6 months? No  LIVING ENVIRONMENT: Lives with: lives with their spouse Lives in: House/apartment Stairs: Yes: Internal: 12 steps; on right going up and External: 4 steps; bilateral but cannot reach both Has following equipment at home: Single point cane, Walker - 2 wheeled, Environmental Consultant - 4 wheeled, and Grab bars  OCCUPATION: Human resources officer.   PLOF: Independent  PATIENT GOALS: Pt would like to walk straight.   NEXT MD VISIT: 01/19/2024  OBJECTIVE:  Note: Objective measures were completed at Evaluation unless otherwise noted.  DIAGNOSTIC FINDINGS: IMPRESSION: 1. Progressive fluid signal tracking between the gluteus minimus and within and along the gluteus medius muscle extending down to moderate gluteus medius tendinopathy and peritendinitis. Mild to moderate left gluteus minimus tendinopathy distally. 2. Trace left trochanteric bursitis. 3. Moderate degenerative hip arthropathy bilaterally. 4. Proximal hamstring tendinopathy and likely partial tearing similar to prior. 5. Substantial loss of intervertebral disc height at L4-5 and L5-S1 with type 2 degenerative endplate findings eccentric to the right at L4-5. 6. Intramural uterine fibroids. 7. Sigmoid colon diverticulosis.  PATIENT SURVEYS:  25/80 LEFS: 11/24: 47/80  COGNITION: Overall cognitive status: Within functional limits for tasks assessed     SENSATION: WFL  EDEMA:  Localized swelling from surgery.   POSTURE: No Significant postural limitations  PALPATION: Tenderness to palpation at surgical site.   LOWER EXTREMITY ROM:  Passive ROM Right eval Left eval 02/26/24  04/10/24 Left 04/10/24 Right  Hip flexion   92 115 AROM 112 AROM  Hip extension       Hip abduction    9 initially (17 after stretching and STM) 33  Hip adduction       Hip internal rotation    29 31  Hip external rotation    24 26  Knee  flexion       Knee extension       Ankle dorsiflexion       Ankle plantarflexion       Ankle inversion       Ankle eversion        (Blank rows = not tested)  LOWER EXTREMITY MMT:  MMT Right 11/24 Left 11/24  Hip flexion 4+ 4-  Hip extension    Hip abduction 4+ 4-  Hip adduction    Hip internal rotation 4+ 3+!  Hip external rotation 4+ 4-  Knee flexion    Knee extension    Ankle dorsiflexion    Ankle plantarflexion    Ankle inversion    Ankle eversion     (Blank rows = not tested)   GAIT: Distance walked: 70ft  Assistive device utilized: Walker - 2 wheeled Level of assistance: Complete Independence Comments: Slow, antalgic gait with PWB. (30% WB per pt).  TREATMENT DATE:   04/10/24 Nustep lvl 5 x 5 mins UE/LE's LEFS Updated ROM and MMT Goal review  PROM L hip abduction STM to L adductors Step ups 4 inch 2x10 Marching on airex 2x15 with UE support Mini squats with UE support on rail 1x10 LAQ  3#   2x15   04/06/24 Nustep lvl 5 x 5 mins UE/LE's Step ups 4 inch x10, 6 inch 1x8 Marching on airex 2x15 with UE support Mini squats with UE support on rail 2x10 Lateral band walks with YTB above knees x 2 laps Standing hip abduction 2#  2x15 bilat Step downs x 10 reps each on 2 inch and 4 inch step with UE support LAQ  3#   2x15  Pt received L hip PROM in flexion, ER, and IR in supine.  04/04/24 Nustep lvl 4-5 x 6 mins UE/LE's Mini squats with UE support on counter 2x10 Standing hip abduction 2#  2x15 bilat Step ups 4 inch 2x10 L LE, 1x10 R LE Lateral band walks with YTB above knees x 2 laps Marching on airex 2x15 with UE support Stool rotations 2x10 reps LAQ  3#   3x10 Supine bridge 2x10  03/29/24 Nustep lvl 4 x 5 mins UE/LE's Supine bridge 2x10 Pt received L hip PROM in flexion, ER, and IR in supine per pt and tissue  tolerance.  Q-ped rocking x 10 reps Marching on airex 2x10 with UE support PT attempted mini squats with UE support on rail though PT stopped due to pain Sit to stands approx 4 reps with table elevated Sidestepping x 3 laps at rail with UE support Stool rotations x 10 reps   03/23/24 Nustep lvl 5, 5 mins UE/LE's  Pt received L hip PROM in flexion, abd, ER, and IR per pt and tissue tolerance.   LAQ 3# 3x10 Step ups 4 inch step 2x10 Marching on airex 2x10 with UE support    03/20/24 Nustep lvl 5, 5 min UE/ LE's Step ups 2x10 in 4 inch step Mini squats with UE support on rail 2x10 Sidestepping x 3 laps at rail with UE support Marching on airex 2x10 with UE support Stool rotations x 10 Standing hip abduction x 15 with 2# bilat LAQ  2# x12, 3# 2x10  Pt received L hip PROM in flexion, ER, and IR per pt and tissue tolerance.    03/13/24: Nustep lvl 5, 5 min UE/ LE's Standing hip abduction with 2# x15 each leg  Standing hip extension with 2# x15 each leg  Standing hip flexion with 2# x15 leg  Marching on airex pad to tolerance Step ups to 4 step with R LE only.  Marching on airex pad to tolerance Toe touch to cone  Marching over hurdles 6 laps    03/09/24: Nustep lvl 5, 5 min UE/ LE's LAQ with 2# 2x10  Standing hip abduction with 2# x15 each leg  Standing hip extension with 2# x15 each leg  Standing hip flexion with 2# x15 leg  Marching on airex pad to tolerance Step ups to 4 step with R LE only.       PATIENT EDUCATION:  Education details: relevant anatomy, exercise form, diagnosis, post op limitations and restrictions, HEP,  and POC. Person educated: Patient Education method: Medical Illustrator, verbal cues Education comprehension: verbalized understanding, returned demonstration, verbal cues required, and tactile cues required  HOME EXERCISE PROGRAM: Access Code: [email or web address] URL: [email or web address] Date: 01/08/2024 Prepared by:  Rojean Batten    ASSESSMENT:  CLINICAL  IMPRESSION:  Pt now 14 weeks s/p glute med repair. Pt has attended 20 visits on PT thus far and is making steady progress towards goals. Pt has met 1/3 STG and 3/6 LTG. Working towards all other goals. Pt reports improved ability with gardening tasks and ADLs. Pt observed still leaning towards R side with gait and decreased heel contact/toe off  on L LE. Improved score on LEFS today. Pt significantly tight into L hip adduction likely due to muscle tightness. Very tender to palpation of L hip adductor mm. Pt unable to complete step up onto 6 step in clinic leading with L LE, but can easily to 4 step. Pt having shockwave treatment this afternoon for L hip. Pt will continue to benefit from PT to improve functional capacity.   OBJECTIVE IMPAIRMENTS: decreased activity tolerance, difficulty walking, decreased balance, decreased endurance, decreased mobility, decreased ROM, decreased strength, impaired flexibility, impaired UE/LE use, postural dysfunction, and pain.  ACTIVITY LIMITATIONS: bending, lifting, carry, locomotion, cleaning, community activity, driving, and or occupation  PERSONAL FACTORS: Age, second surgical procedure to same location, past medical history are also affecting patient's functional outcome.  REHAB POTENTIAL: Good  CLINICAL DECISION MAKING: Stable/uncomplicated  EVALUATION COMPLEXITY: Low    SHORT TERM GOALS: 01/28/24  Pt will be independent and compliant with HEP for improved pain, strength, and function. Baseline: Goal status: MET 9/3 Target date:      2.  Pt will demo improved quality of gait with increased stance time and toe off on L.   Baseline:  Goal status: IN PROGRESS 9/3 Target date:     3.  Pt will progress with exercises per protocol without adverse effects for improved strength and mobility.   Baseline:  Goal status: IN PROGRESS 11/24 Target date:       LONG TERM GOALS: Target date: 04/28/2024     Pt  will be able to ambulate extended community distance without significant difficulty and pain.   Baseline:  Goal status: MET 04/10/24    ~1 mile  2.  Pt will be able to perform her ADLs and IADLs including household chores without significant difficulty and pain. Baseline:  Goal status: IN PROGRESS 11/14   3.  Pt will be able to take care of her husband without significant limitations.   Baseline:  Goal status: Met 03/13/24   4.  Pt will be able to perform mini squats with good form and without increased pain in order for improved functional LE strength and to assist with returning to gardening activities.   Baseline:  Goal status: MET 11/24  Pt will report improved tolerance with standing activities and ambulation.  Baseline:  Goal status: IN PROGRESS 11/24 Target date:       5.  Pt will ambulate with a normalized heel to toe gait without limping.  Baseline:  Goal status: IN PROGRESS 11/24 Target date:      6.  Pt will be able to perform a 6 inch step up with good form and control with L LE leading for improved performance of stairs abd and improved functional strength. Baseline:  Goal status: Ongoing 04/10/24, pt has pain with 6, but can handle 4  Target date:       PLAN:   PT FREQUENCY:  2x/wk    PT DURATION: 4 weeks    PLANNED INTERVENTIONS (unless contraindicated): aquatic PT, Canalith repositioning, cryotherapy, Electrical stimulation, Iontophoresis with 4 mg/ml dexamethasome, Moist heat, traction, Ultrasound, gait training, Therapeutic exercise, balance training, neuromuscular re-education, patient/family education,  prosthetic training, manual techniques, passive ROM, dry needling, taping, vasopnuematic device, vestibular, spinal manipulations, joint manipulations  PLAN FOR NEXT SESSION:  Cont per Dr. Danetta Patch med repair protocol.   Asberry Rodes, PTA  04/10/24 10:38 AM  PT reviewed note, objective findings, goals, and POC.  PT spoke with PTA.  Leigh Minerva III PT, DPT 04/11/24 8:46 AM

## 2024-04-10 NOTE — Progress Notes (Signed)
 Patient says that she is sore today as she had physical therapy this morning. She is usually sore until her next appointment, and when she has a few extra days to recover she still has soreness, although it is not as bad. Stretching seems to hurt her the most. She is here today for her first trial of shockwave therapy.

## 2024-04-12 ENCOUNTER — Encounter (HOSPITAL_BASED_OUTPATIENT_CLINIC_OR_DEPARTMENT_OTHER): Payer: Self-pay | Admitting: Physical Therapy

## 2024-04-12 ENCOUNTER — Ambulatory Visit (HOSPITAL_BASED_OUTPATIENT_CLINIC_OR_DEPARTMENT_OTHER): Admitting: Physical Therapy

## 2024-04-12 DIAGNOSIS — M6281 Muscle weakness (generalized): Secondary | ICD-10-CM | POA: Diagnosis not present

## 2024-04-12 DIAGNOSIS — M25552 Pain in left hip: Secondary | ICD-10-CM | POA: Diagnosis not present

## 2024-04-12 DIAGNOSIS — R262 Difficulty in walking, not elsewhere classified: Secondary | ICD-10-CM | POA: Diagnosis not present

## 2024-04-12 DIAGNOSIS — M25652 Stiffness of left hip, not elsewhere classified: Secondary | ICD-10-CM | POA: Diagnosis not present

## 2024-04-12 NOTE — Therapy (Unsigned)
 OUTPATIENT PHYSICAL THERAPY LOWER EXTREMITY TREATMENT Progress Note Reporting Period 03/30/24 to 04/10/24  See note below for Objective Data and Assessment of Progress/Goals.         Patient Name: Ellen [name] MRN: [id number] DOB:Jun 08, 1940, 83 y.o., female Today's Date: 04/12/2024  END OF SESSION:                     Past Medical History:  Diagnosis Date   Allergy    Anxiety    Arthritis    Bursitis    left thigh   GERD (gastroesophageal reflux disease)    History of kidney stones    Hypertension    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   Left Buccal Cheek (Keratoacanthoma)   Umbilical hernia    Past Surgical History:  Procedure Laterality Date   BOWEL RESECTION  07/09/2013   Procedure: SMALL BOWEL RESECTION;  Surgeon: Krystal CHRISTELLA Spinner, MD;  Location: [facility];  Service: General;;   RODDIE NEVIN REPAIR Left 03/22/2023   Procedure: LEFT GLUTEUS MEDIUS REPAIR WITH POSSIBLE COLLAGEN PATCH AUGMENTATION;  Surgeon: Genelle Standing, MD;  Location: [facility];  Service: Orthopedics;  Laterality: Left;   GLUTEUS MINIMUS REPAIR Left 01/04/2024   Procedure: REPAIR, TENDON, GLUTEUS MINIMUS;  Surgeon: Genelle Standing, MD;  Location: [facility];  Service: Orthopedics;  Laterality: Left;  LEFT HIP GLUTEUS MAXIMUS TENDON TRANSFER WITH COLLAGEN PATCH AUGMENTATION   LAPAROTOMY N/A 07/09/2013   Procedure: EXPLORATORY LAPAROTOMY;  Surgeon: Krystal CHRISTELLA Spinner, MD;  Location: [facility];  Service: General;  Laterality: N/A;   SHOULDER ARTHROSCOPY WITH CAPSULORRHAPHY     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   Hypokalemia 12/27/2022   Volume depletion 12/24/2022   Constipation 12/24/2022   Partial small bowel obstruction (HCC) 12/24/2022   Polycythemia 12/24/2022    Gastroesophageal reflux disease 08/26/2022   Bloating 08/26/2022   Small intestinal bacterial overgrowth (SIBO) 08/26/2022   Osteoarthritis 12/01/2021   Plantar wart 12/01/2021   Ventral hernia 01/01/2021   Diverticulosis 01/05/2019   Dermatitis 01/05/2019   Systolic murmur 07/25/2018   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   Hyperglycemia 10/05/2017   Leg edema 10/05/2017   Hammer toe 10/05/2017   Hot flashes due to menopause 10/05/2017   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/10/2013 07/11/2013   Hypertension 07/09/2013    PCP: Kennyth Worth CHRISTELLA, MD  REFERRING PROVIDER: Genelle Standing, MD  REFERRING DIAG: Tendinopathy of gluteus medius [F32.040]   THERAPY DIAG:  No diagnosis found.  Rationale for Evaluation and Treatment: Rehabilitation  ONSET DATE: 01/04/2024  SUBJECTIVE:   SUBJECTIVE STATEMENT:  Pt is 14 weeks and 1 day post op.  Pt had shockwave therapy by Dr. Burnetta on Monday.  She states it helped as she is not having as much pain.  Pt states she still has pain though is better.  Pt states MD informed her to continue with PT.       PERTINENT HISTORY: L hip gluteus maximus tendon transfer and trochanteric bursectomy on 01/04/24 Glute min repair, Laparotomy, Shoulder arthroscopy with capsulorrhaphy.   PAIN:  Are you having pain? Yes: NPRS scale: 3/10   Pain location: lateral and posterolateral L hip Pain description: Constant pain Aggravating factors: Walking, standing, sitting, any hip flexion  Relieving factors: Ice.   PRECAUTIONS: Other: gute min,  fall risk.  RED FLAGS: None   WEIGHT BEARING RESTRICTIONS: Yes WBAT  FALLS:  Has patient fallen in last 6 months? No  LIVING ENVIRONMENT: Lives with: lives with their spouse Lives in: House/apartment Stairs: Yes: Internal: 12 steps; on right going up and External: 4 steps; bilateral but cannot reach both Has following equipment at home: Single point cane, Walker - 2 wheeled, Environmental Consultant - 4  wheeled, and Grab bars  OCCUPATION: Human resources officer.   PLOF: Independent  PATIENT GOALS: Pt would like to walk straight.   NEXT MD VISIT: 01/19/2024  OBJECTIVE:  Note: Objective measures were completed at Evaluation unless otherwise noted.  DIAGNOSTIC FINDINGS: IMPRESSION: 1. Progressive fluid signal tracking between the gluteus minimus and within and along the gluteus medius muscle extending down to moderate gluteus medius tendinopathy and peritendinitis. Mild to moderate left gluteus minimus tendinopathy distally. 2. Trace left trochanteric bursitis. 3. Moderate degenerative hip arthropathy bilaterally. 4. Proximal hamstring tendinopathy and likely partial tearing similar to prior. 5. Substantial loss of intervertebral disc height at L4-5 and L5-S1 with type 2 degenerative endplate findings eccentric to the right at L4-5. 6. Intramural uterine fibroids. 7. Sigmoid colon diverticulosis.  PATIENT SURVEYS:  25/80 LEFS: 11/24: 47/80  COGNITION: Overall cognitive status: Within functional limits for tasks assessed     SENSATION: WFL  EDEMA:  Localized swelling from surgery.   POSTURE: No Significant postural limitations  PALPATION: Tenderness to palpation at surgical site.   LOWER EXTREMITY ROM:  Passive ROM Right eval Left eval 02/26/24  04/10/24 Left 04/10/24 Right  Hip flexion   92 115 AROM 112 AROM  Hip extension       Hip abduction    9 initially (17 after stretching and STM) 33  Hip adduction       Hip internal rotation    29 31  Hip external rotation    24 26  Knee flexion       Knee extension       Ankle dorsiflexion       Ankle plantarflexion       Ankle inversion       Ankle eversion        (Blank rows = not tested)  LOWER EXTREMITY MMT:  MMT Right 11/24 Left 11/24  Hip flexion 4+ 4-  Hip extension    Hip abduction 4+ 4-  Hip adduction    Hip internal rotation 4+ 3+!  Hip external rotation 4+ 4-  Knee flexion    Knee  extension    Ankle dorsiflexion    Ankle plantarflexion    Ankle inversion    Ankle eversion     (Blank rows = not tested)   GAIT: Distance walked: 28ft  Assistive device utilized: Walker - 2 wheeled Level of assistance: Complete Independence Comments: Slow, antalgic gait with PWB. (30% WB per pt).  TREATMENT DATE:   04/12/24 Pt received L hip PROM in flexion, Abd, ER, and IR in supine. Nustep lvl 5 x 5 mins bilat UE/LEs Marching on airex with UE support on rail 2x15 Step ups with rail support x 10 on 4 inch step, x 5 on 4 inch step Lateral band walks with YTB above knees x 3 laps Mini squats with UE support on rail x 10 reps Stool rotations 2x10 LAQ  3#   2x15    04/10/24 Nustep lvl 5 x 5 mins UE/LE's LEFS Updated ROM and MMT Goal review  PROM L hip abduction STM to L adductors Step ups 4 inch 2x10 Marching on airex 2x15 with UE support Mini squats with UE support on rail 1x10 LAQ  3#   2x15   04/06/24 Nustep lvl 5 x 5 mins UE/LE's Step ups 4 inch x10, 6 inch 1x8 Marching on airex 2x15 with UE support Mini squats with UE support on rail 2x10 Lateral band walks with YTB above knees x 2 laps Standing hip abduction 2#  2x15 bilat Step downs x 10 reps each on 2 inch and 4 inch step with UE support LAQ  3#   2x15  Pt received L hip PROM in flexion, ER, and IR in supine.  04/04/24 Nustep lvl 4-5 x 6 mins UE/LE's Mini squats with UE support on counter 2x10 Standing hip abduction 2#  2x15 bilat Step ups 4 inch 2x10 L LE, 1x10 R LE Lateral band walks with YTB above knees x 2 laps Marching on airex 2x15 with UE support Stool rotations 2x10 reps LAQ  3#   3x10 Supine bridge 2x10  03/29/24 Nustep lvl 4 x 5 mins UE/LE's Supine bridge 2x10 Pt received L hip PROM in flexion, ER, and IR in supine per pt and tissue tolerance.  Q-ped  rocking x 10 reps Marching on airex 2x10 with UE support PT attempted mini squats with UE support on rail though PT stopped due to pain Sit to stands approx 4 reps with table elevated Sidestepping x 3 laps at rail with UE support Stool rotations x 10 reps   03/23/24 Nustep lvl 5, 5 mins UE/LE's  Pt received L hip PROM in flexion, abd, ER, and IR per pt and tissue tolerance.   LAQ 3# 3x10 Step ups 4 inch step 2x10 Marching on airex 2x10 with UE support    03/20/24 Nustep lvl 5, 5 min UE/ LE's Step ups 2x10 in 4 inch step Mini squats with UE support on rail 2x10 Sidestepping x 3 laps at rail with UE support Marching on airex 2x10 with UE support Stool rotations x 10 Standing hip abduction x 15 with 2# bilat LAQ  2# x12, 3# 2x10  Pt received L hip PROM in flexion, ER, and IR per pt and tissue tolerance.    03/13/24: Nustep lvl 5, 5 min UE/ LE's Standing hip abduction with 2# x15 each leg  Standing hip extension with 2# x15 each leg  Standing hip flexion with 2# x15 leg  Marching on airex pad to tolerance Step ups to 4 step with R LE only.  Marching on airex pad to tolerance Toe touch to cone  Marching over hurdles 6 laps    03/09/24: Nustep lvl 5, 5 min UE/ LE's LAQ with 2# 2x10  Standing hip abduction with 2# x15 each leg  Standing hip extension with 2# x15 each leg  Standing hip flexion with 2# x15 leg  Marching on airex pad to  tolerance Step ups to 4 step with R LE only.       PATIENT EDUCATION:  Education details: relevant anatomy, exercise form, diagnosis, post op limitations and restrictions, HEP,  and POC. Person educated: Patient Education method: Medical Illustrator, verbal cues Education comprehension: verbalized understanding, returned demonstration, verbal cues required, and tactile cues required  HOME EXERCISE PROGRAM: Access Code: [email or web address] URL: [email or web address] Date: 01/08/2024 Prepared by: Rojean Batten    ASSESSMENT:  CLINICAL IMPRESSION:  Pt now 14 weeks s/p glute med repair. Pt has attended 20 visits on PT thus far and is making steady progress towards goals. Pt has met 1/3 STG and 3/6 LTG. Working towards all other goals. Pt reports improved ability with gardening tasks and ADLs. Pt observed still leaning towards R side with gait and decreased heel contact/toe off  on L LE. Improved score on LEFS today. Pt significantly tight into L hip adduction likely due to muscle tightness. Very tender to palpation of L hip adductor mm. Pt unable to complete step up onto 6 step in clinic leading with L LE, but can easily to 4 step. Pt having shockwave treatment this afternoon for L hip. Pt will continue to benefit from PT to improve functional capacity.  Pt reports she could still feel pain with 4 inch step ups though it was less than prior.  Pt attempted step ups on the 6 inch step though stopped at 5 reps due to pain.    OBJECTIVE IMPAIRMENTS: decreased activity tolerance, difficulty walking, decreased balance, decreased endurance, decreased mobility, decreased ROM, decreased strength, impaired flexibility, impaired UE/LE use, postural dysfunction, and pain.  ACTIVITY LIMITATIONS: bending, lifting, carry, locomotion, cleaning, community activity, driving, and or occupation  PERSONAL FACTORS: Age, second surgical procedure to same location, past medical history are also affecting patient's functional outcome.  REHAB POTENTIAL: Good  CLINICAL DECISION MAKING: Stable/uncomplicated  EVALUATION COMPLEXITY: Low    SHORT TERM GOALS: 01/28/24  Pt will be independent and compliant with HEP for improved pain, strength, and function. Baseline: Goal status: MET 9/3 Target date:      2.  Pt will demo improved quality of gait with increased stance time and toe off on L.   Baseline:  Goal status: IN PROGRESS 9/3 Target date:     3.  Pt will progress with exercises per protocol without  adverse effects for improved strength and mobility.   Baseline:  Goal status: IN PROGRESS 11/24 Target date:       LONG TERM GOALS: Target date: 04/28/2024     Pt will be able to ambulate extended community distance without significant difficulty and pain.   Baseline:  Goal status: MET 04/10/24    ~1 mile  2.  Pt will be able to perform her ADLs and IADLs including household chores without significant difficulty and pain. Baseline:  Goal status: IN PROGRESS 11/14   3.  Pt will be able to take care of her husband without significant limitations.   Baseline:  Goal status: Met 03/13/24   4.  Pt will be able to perform mini squats with good form and without increased pain in order for improved functional LE strength and to assist with returning to gardening activities.   Baseline:  Goal status: MET 11/24  Pt will report improved tolerance with standing activities and ambulation.  Baseline:  Goal status: IN PROGRESS 11/24 Target date:       5.  Pt will ambulate with a normalized heel to toe gait without limping.  Baseline:  Goal status: IN PROGRESS 11/24 Target date:      6.  Pt will be able to perform a 6 inch step up with good form and control with L LE leading for improved performance of stairs abd and improved functional strength. Baseline:  Goal status: Ongoing 04/10/24, pt has pain with 6, but can handle 4  Target date:       PLAN:   PT FREQUENCY:  2x/wk    PT DURATION: 4 weeks    PLANNED INTERVENTIONS (unless contraindicated): aquatic PT, Canalith repositioning, cryotherapy, Electrical stimulation, Iontophoresis with 4 mg/ml dexamethasome, Moist heat, traction, Ultrasound, gait training, Therapeutic exercise, balance training, neuromuscular re-education, patient/family education, prosthetic training, manual techniques, passive ROM, dry needling, taping, vasopnuematic device, vestibular, spinal manipulations, joint manipulations  PLAN FOR NEXT SESSION:  Cont per  Dr. Danetta Patch med repair protocol.   Asberry Rodes, PTA  04/12/24 10:25 AM  PT reviewed note, objective findings, goals, and POC.  PT spoke with PTA.  Leigh Minerva III PT, DPT 04/12/24 10:25 AM

## 2024-04-18 ENCOUNTER — Encounter: Payer: Self-pay | Admitting: Sports Medicine

## 2024-04-18 ENCOUNTER — Ambulatory Visit: Admitting: Sports Medicine

## 2024-04-18 DIAGNOSIS — M7632 Iliotibial band syndrome, left leg: Secondary | ICD-10-CM

## 2024-04-18 DIAGNOSIS — M67959 Unspecified disorder of synovium and tendon, unspecified thigh: Secondary | ICD-10-CM | POA: Diagnosis not present

## 2024-04-18 DIAGNOSIS — G8929 Other chronic pain: Secondary | ICD-10-CM | POA: Diagnosis not present

## 2024-04-18 DIAGNOSIS — M25552 Pain in left hip: Secondary | ICD-10-CM | POA: Diagnosis not present

## 2024-04-18 NOTE — Progress Notes (Signed)
 Ellen [name] - 83 y.o. female MRN [id number]  Date of birth: 1940/11/08  Office Visit Note: Visit Date: 04/18/2024 PCP: Kennyth Worth HERO, MD Referred by: Kennyth Worth HERO, MD  Subjective: Chief Complaint  Patient presents with   Left Hip - Follow-up   HPI: Ellen [name] is a pleasant 83 y.o. female who presents today for  acute on chronic left lateral hip pain.   She is status post (2nd surgery) left hip gluteus maximus tendon transfer on 01/04/2024 with Dr. Genelle.  She is still recovering from this and progressing through PT. we did proceed with our first trial of shockwave therapy last visit and she felt about 40% improved for the first 3-4 days, following this effect started to wane but still is overall slightly improved.  Pertinent ROS were reviewed with the patient and found to be negative unless otherwise specified above in HPI.   Assessment & Plan: Visit Diagnoses:  1. Chronic left hip pain   2. Tendinopathy of gluteus medius   3. It band syndrome, left    Plan: Impression is chronic left lateral hip pain in the setting of previous gluteus maximus tendon transfer x 2.  Most of her pain is emanating from her GTPS although she does have a degree of proximal IT band syndrome as well likely from compensation.  We discussed trialing extracorporeal shockwave therapy and then see what sort of cumulative benefit she receives after 2-3 treatments before deciding on additional treatments.  She is amenable to this, proceed with ESWT today.  She will continue her formalized physical therapy as she is finding benefit from this.  She will continue her meloxicam  15 mg daily as needed. F/u in 1 week.  Follow-up: Return in about 1 week (around 04/25/2024) for Lat hip f/u (last reg visit).   Meds & Orders: No orders of the defined types were placed in this encounter.  No orders of the defined types were placed in this encounter.    Procedures: Procedure: ECSWT Indications: Greater  trochanteric pain syndrome, IT band syndrome   Procedure Details Consent: Risks of procedure as well as the alternatives and risks of each were explained to the patient.  Verbal consent for procedure obtained. Time Out: Verified patient identification, verified procedure, site was marked, verified correct patient position. The area was cleaned with alcohol swab.     The left greater trochanteric region, gluteal insertion and proximal IT band was targeted for Extracorporeal shockwave therapy.    Preset: Trochanteric bursitis/tendinopathy Power Level: 110 mJ Frequency: 12 Hz Impulse/cycles: 2000 Head size: Regular   *IT-Band Power Level: 90 mJ Frequency: 10 Hz Impulse/cycles: 1500 Head size: Regular   Patient tolerated procedure well without immediate complications.      Clinical History: No specialty comments available.  She reports that she has quit smoking. Her smoking use included cigarettes. She has never used smokeless tobacco.  Recent Labs    06/28/23 1131  HGBA1C 5.4    Objective:    Physical Exam  Gen: Well-appearing, in no acute distress; non-toxic CV: Well-perfused. Warm.  Resp: Breathing unlabored on room air; no wheezing. Psych: Fluid speech in conversation; appropriate affect; normal thought process  Ortho Exam - Well-healed incision over the greater trochanteric and gluteal region.  There is mild TTP over the greater trochanter without evidence of bursitis or swelling.  Imaging: No results found.  Past Medical/Family/Surgical/Social History: Medications & Allergies reviewed per EMR, new medications updated. 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   Hypokalemia 12/27/2022   Volume depletion 12/24/2022   Constipation 12/24/2022   Partial small bowel obstruction (HCC) 12/24/2022   Polycythemia  12/24/2022   Gastroesophageal reflux disease 08/26/2022   Bloating 08/26/2022   Small intestinal bacterial overgrowth (SIBO) 08/26/2022   Osteoarthritis 12/01/2021   Plantar wart 12/01/2021   Ventral hernia 01/01/2021   Diverticulosis 01/05/2019   Dermatitis 01/05/2019   Systolic murmur 07/25/2018   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   Hyperglycemia 10/05/2017   Leg edema 10/05/2017   Hammer toe 10/05/2017   Hot flashes due to menopause 10/05/2017   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/10/2013 07/11/2013   Hypertension 07/09/2013   Past Medical History:  Diagnosis Date   Allergy    Anxiety    Arthritis    Bursitis    left thigh   GERD (gastroesophageal reflux disease)    History of kidney stones    Hypertension    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   Left Buccal Cheek (Keratoacanthoma)   Umbilical hernia    Family History  Problem Relation Age of Onset   Diabetes Mother    Heart attack Mother    CAD Father    Heart attack Father    Breast cancer Neg Hx    Colon cancer Neg Hx    Esophageal cancer Neg Hx    Rectal cancer Neg Hx    Stomach cancer Neg Hx    Past Surgical History:  Procedure Laterality Date   BOWEL RESECTION  07/09/2013   Procedure: SMALL BOWEL RESECTION;  Surgeon: Krystal CHRISTELLA Spinner, MD;  Location: [facility];  Service: General;;   RODDIE NEVIN REPAIR Left 03/22/2023   Procedure: LEFT GLUTEUS MEDIUS REPAIR WITH POSSIBLE COLLAGEN PATCH AUGMENTATION;  Surgeon: Ellen Standing, MD;  Location: [facility];  Service: Orthopedics;  Laterality: Left;   GLUTEUS MINIMUS REPAIR Left 01/04/2024   Procedure: REPAIR, TENDON, GLUTEUS MINIMUS;  Surgeon: Ellen Standing, MD;  Location: [facility];  Service: Orthopedics;  Laterality: Left;  LEFT HIP GLUTEUS MAXIMUS TENDON TRANSFER WITH COLLAGEN PATCH AUGMENTATION   LAPAROTOMY N/A 07/09/2013   Procedure: EXPLORATORY LAPAROTOMY;  Surgeon: Krystal CHRISTELLA Spinner, MD;   Location: [facility];  Service: General;  Laterality: N/A;   SHOULDER ARTHROSCOPY WITH CAPSULORRHAPHY     Social History   Occupational History   Occupation: Retired   Tobacco Use   Smoking status: Former    Types: Cigarettes   Smokeless tobacco: Never  Vaping Use   Vaping status: Never Used  Substance and Sexual Activity   Alcohol use: Yes    Alcohol/week: 7.0 standard drinks of alcohol    Types: 7 Glasses of wine per week    Comment: nightly glass of wine   Drug use: No   Sexual activity: Not Currently

## 2024-04-18 NOTE — Progress Notes (Signed)
 Patient says that she felt about 40% improved for 3 days after the last shockwave treatment. She has since had gradual return of her symptoms. She denies any significant soreness or tenderness after the last treatment, and is here for repeat shockwave therapy today.

## 2024-04-20 ENCOUNTER — Ambulatory Visit (HOSPITAL_BASED_OUTPATIENT_CLINIC_OR_DEPARTMENT_OTHER): Payer: Self-pay | Attending: Orthopaedic Surgery

## 2024-04-20 ENCOUNTER — Encounter (HOSPITAL_BASED_OUTPATIENT_CLINIC_OR_DEPARTMENT_OTHER): Payer: Self-pay

## 2024-04-20 DIAGNOSIS — M25652 Stiffness of left hip, not elsewhere classified: Secondary | ICD-10-CM | POA: Insufficient documentation

## 2024-04-20 DIAGNOSIS — R262 Difficulty in walking, not elsewhere classified: Secondary | ICD-10-CM | POA: Insufficient documentation

## 2024-04-20 DIAGNOSIS — M25552 Pain in left hip: Secondary | ICD-10-CM | POA: Diagnosis present

## 2024-04-20 DIAGNOSIS — R293 Abnormal posture: Secondary | ICD-10-CM | POA: Insufficient documentation

## 2024-04-20 DIAGNOSIS — M6281 Muscle weakness (generalized): Secondary | ICD-10-CM | POA: Diagnosis present

## 2024-04-20 NOTE — Therapy (Signed)
 OUTPATIENT PHYSICAL THERAPY LOWER EXTREMITY TREATMENT        Patient Name: Ellen [name] MRN: [id number] DOB:[birth year], 83 y.o., female Today's Date: 04/20/2024  END OF SESSION:  PT End of Session - 04/20/24 1307     Visit Number 22    Number of Visits 24    Date for Recertification  04/28/24    Authorization Type MCR A & B    Progress Note Due on Visit 30    PT Start Time 1302    PT Stop Time 1335   Pt requested to leave early to pick up her husband   PT Time Calculation (min) 33 min    Activity Tolerance Patient tolerated treatment well    Behavior During Therapy [facility] for tasks assessed/performed                             Past Medical History:  Diagnosis Date   Allergy    Anxiety    Arthritis    Bursitis    left thigh   GERD (gastroesophageal reflux disease)    History of kidney stones    Hypertension    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   Left Buccal Cheek (Keratoacanthoma)   Umbilical hernia    Past Surgical History:  Procedure Laterality Date   BOWEL RESECTION  07/09/2013   Procedure: SMALL BOWEL RESECTION;  Surgeon: Krystal CHRISTELLA Spinner, MD;  Location: [facility];  Service: General;;   RODDIE NEVIN REPAIR Left 03/22/2023   Procedure: LEFT GLUTEUS MEDIUS REPAIR WITH POSSIBLE COLLAGEN PATCH AUGMENTATION;  Surgeon: Genelle Standing, MD;  Location: [facility];  Service: Orthopedics;  Laterality: Left;   GLUTEUS MINIMUS REPAIR Left 01/04/2024   Procedure: REPAIR, TENDON, GLUTEUS MINIMUS;  Surgeon: Genelle Standing, MD;  Location:  SURGERY CENTER;  Service: Orthopedics;  Laterality: Left;  LEFT HIP GLUTEUS MAXIMUS TENDON TRANSFER WITH COLLAGEN PATCH AUGMENTATION   LAPAROTOMY N/A 07/09/2013   Procedure: EXPLORATORY LAPAROTOMY;  Surgeon: Krystal CHRISTELLA Spinner, MD;  Location: [facility];  Service: General;  Laterality: N/A;   SHOULDER ARTHROSCOPY WITH CAPSULORRHAPHY     Patient Active Problem List   Diagnosis Date Noted    B12 deficiency 07/30/2023   Skin ulcer of ankle, limited to breakdown of skin (HCC) 04/28/2023   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   Hypokalemia 12/27/2022   Volume depletion 12/24/2022   Constipation 12/24/2022   Partial small bowel obstruction (HCC) 12/24/2022   Polycythemia 12/24/2022   Gastroesophageal reflux disease 08/26/2022   Bloating 08/26/2022   Small intestinal bacterial overgrowth (SIBO) 08/26/2022   Osteoarthritis 12/01/2021   Plantar wart 12/01/2021   Ventral hernia 01/01/2021   Diverticulosis 01/05/2019   Dermatitis 01/05/2019   Systolic murmur 07/25/2018   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   Hyperglycemia 10/05/2017   Leg edema 10/05/2017   Hammer toe 10/05/2017   Hot flashes due to menopause 10/05/2017   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/10/2013 07/11/2013   Hypertension 07/09/2013    PCP: [place] CHRISTELLA, MD  REFERRING PROVIDER: Genelle Standing, MD  REFERRING DIAG: Tendinopathy of gluteus medius [F32.040]   THERAPY DIAG:  Pain in left hip  Muscle weakness (generalized)  Stiffness of left hip, not elsewhere classified  Difficulty in walking, not elsewhere classified  Rationale for Evaluation and Treatment: Rehabilitation  ONSET DATE: 01/04/2024  SUBJECTIVE:   SUBJECTIVE STATEMENT:  Pt unsure if shockwave tx is helping. 3-4/10 pain level in L  hip at entry.     PERTINENT HISTORY: L hip gluteus maximus tendon transfer and trochanteric bursectomy on 01/04/24 Glute min repair, Laparotomy, Shoulder arthroscopy with capsulorrhaphy.   PAIN:  Are you having pain? Yes: NPRS scale: 3-4/10   Pain location: lateral and posterolateral L hip Pain description: Constant pain Aggravating factors: Walking, standing, sitting, any hip flexion  Relieving factors: Ice.   PRECAUTIONS: Other: gute min, fall risk.  RED FLAGS: None   WEIGHT BEARING RESTRICTIONS: Yes WBAT  FALLS:  Has patient fallen in last 6 months?  No  LIVING ENVIRONMENT: Lives with: lives with their spouse Lives in: House/apartment Stairs: Yes: Internal: 12 steps; on right going up and External: 4 steps; bilateral but cannot reach both Has following equipment at home: Single point cane, Walker - 2 wheeled, Environmental Consultant - 4 wheeled, and Grab bars  OCCUPATION: Human resources officer.   PLOF: Independent  PATIENT GOALS: Pt would like to walk straight.   NEXT MD VISIT: 01/19/2024  OBJECTIVE:  Note: Objective measures were completed at Evaluation unless otherwise noted.  DIAGNOSTIC FINDINGS: IMPRESSION: 1. Progressive fluid signal tracking between the gluteus minimus and within and along the gluteus medius muscle extending down to moderate gluteus medius tendinopathy and peritendinitis. Mild to moderate left gluteus minimus tendinopathy distally. 2. Trace left trochanteric bursitis. 3. Moderate degenerative hip arthropathy bilaterally. 4. Proximal hamstring tendinopathy and likely partial tearing similar to prior. 5. Substantial loss of intervertebral disc height at L4-5 and L5-S1 with type 2 degenerative endplate findings eccentric to the right at L4-5. 6. Intramural uterine fibroids. 7. Sigmoid colon diverticulosis.  PATIENT SURVEYS:  25/80 LEFS: 11/24: 47/80  COGNITION: Overall cognitive status: Within functional limits for tasks assessed     SENSATION: WFL  EDEMA:  Localized swelling from surgery.   POSTURE: No Significant postural limitations  PALPATION: Tenderness to palpation at surgical site.   LOWER EXTREMITY ROM:  Passive ROM Right eval Left eval 02/26/24  04/10/24 Left 04/10/24 Right  Hip flexion   92 115 AROM 112 AROM  Hip extension       Hip abduction    9 initially (17 after stretching and STM) 33  Hip adduction       Hip internal rotation    29 31  Hip external rotation    24 26  Knee flexion       Knee extension       Ankle dorsiflexion       Ankle plantarflexion       Ankle inversion        Ankle eversion        (Blank rows = not tested)  LOWER EXTREMITY MMT:  MMT Right 11/24 Left 11/24  Hip flexion 4+ 4-  Hip extension    Hip abduction 4+ 4-  Hip adduction    Hip internal rotation 4+ 3+!  Hip external rotation 4+ 4-  Knee flexion    Knee extension    Ankle dorsiflexion    Ankle plantarflexion    Ankle inversion    Ankle eversion     (Blank rows = not tested)   GAIT: Distance walked: 66ft  Assistive device utilized: Walker - 2 wheeled Level of assistance: Complete Independence Comments: Slow, antalgic gait with PWB. (30% WB per pt).  TREATMENT DATE:   04/20/24 Pt received L hip PROM in flexion, Abd, ER, and IR in supine. Nustep lvl 4 x 5 mins bilat UE/[place] 2x10 Marching on airex with UE support on rail 2x15 Mini squats with UE support on rail 3x 10 reps Stool rotations 2x10 LAQ  3#   2x15   04/12/24 Pt received L hip PROM in flexion, Abd, ER, and IR in supine. Nustep lvl 5 x 5 mins bilat UE/LEs Marching on airex with UE support on rail 2x15 Step ups with rail support x 10 on 4 inch step, x 5 on 6 inch step Lateral band walks with YTB above knees x 3 laps Mini squats with UE support on rail x 10 reps Stool rotations 2x10 LAQ  3#   2x15    04/10/24 Nustep lvl 5 x 5 mins UE/LE's LEFS Updated ROM and MMT Goal review  PROM L hip abduction STM to L adductors Step ups 4 inch 2x10 Marching on airex 2x15 with UE support Mini squats with UE support on rail 1x10 LAQ  3#   2x15   04/06/24 Nustep lvl 5 x 5 mins UE/LE's Step ups 4 inch x10, 6 inch 1x8 Marching on airex 2x15 with UE support Mini squats with UE support on rail 2x10 Lateral band walks with YTB above knees x 2 laps Standing hip abduction 2#  2x15 bilat Step downs x 10 reps each on 2 inch and 4 inch step with UE support LAQ  3#   2x15  Pt  received L hip PROM in flexion, ER, and IR in supine.  04/04/24 Nustep lvl 4-5 x 6 mins UE/LE's Mini squats with UE support on counter 2x10 Standing hip abduction 2#  2x15 bilat Step ups 4 inch 2x10 L LE, 1x10 R LE Lateral band walks with YTB above knees x 2 laps Marching on airex 2x15 with UE support Stool rotations 2x10 reps LAQ  3#   3x10 Supine bridge 2x10  03/29/24 Nustep lvl 4 x 5 mins UE/LE's Supine bridge 2x10 Pt received L hip PROM in flexion, ER, and IR in supine per pt and tissue tolerance.  Q-ped rocking x 10 reps Marching on airex 2x10 with UE support PT attempted mini squats with UE support on rail though PT stopped due to pain Sit to stands approx 4 reps with table elevated Sidestepping x 3 laps at rail with UE support Stool rotations x 10 reps   03/23/24 Nustep lvl 5, 5 mins UE/LE's  Pt received L hip PROM in flexion, abd, ER, and IR per pt and tissue tolerance.   LAQ 3# 3x10 Step ups 4 inch step 2x10 Marching on airex 2x10 with UE support    03/20/24 Nustep lvl 5, 5 min UE/ LE's Step ups 2x10 in 4 inch step Mini squats with UE support on rail 2x10 Sidestepping x 3 laps at rail with UE support Marching on airex 2x10 with UE support Stool rotations x 10 Standing hip abduction x 15 with 2# bilat LAQ  2# x12, 3# 2x10  Pt received L hip PROM in flexion, ER, and IR per pt and tissue tolerance.    03/13/24: Nustep lvl 5, 5 min UE/ LE's Standing hip abduction with 2# x15 each leg  Standing hip extension with 2# x15 each leg  Standing hip flexion with 2# x15 leg  Marching on airex pad to tolerance Step ups to 4 step with R LE only.  Marching on airex pad to tolerance Toe touch to  cone  Marching over hurdles 6 laps    03/09/24: Nustep lvl 5, 5 min UE/ LE's LAQ with 2# 2x10  Standing hip abduction with 2# x15 each leg  Standing hip extension with 2# x15 each leg  Standing hip flexion with 2# x15 leg  Marching on airex pad to tolerance Step ups to  4 step with R LE only.       PATIENT EDUCATION:  Education details: relevant anatomy, exercise form, diagnosis, post op limitations and restrictions, HEP,  and POC. Person educated: Patient Education method: Medical Illustrator, verbal cues Education comprehension: verbalized understanding, returned demonstration, verbal cues required, and tactile cues required  HOME EXERCISE PROGRAM: Access Code: [email or web address] URL: [email or web address] Date: 01/08/2024 Prepared by: Rojean Batten    ASSESSMENT:  CLINICAL IMPRESSION:  Pt remains tight into passive IR/ER. She tolerated all PROM and strengthening well today without c/o pain. Able to ambulate short distances in clinic without SPC. Pt reports benefit from PT. Session ended a few minutes early due to pt having to pick up her husband.     OBJECTIVE IMPAIRMENTS: decreased activity tolerance, difficulty walking, decreased balance, decreased endurance, decreased mobility, decreased ROM, decreased strength, impaired flexibility, impaired UE/LE use, postural dysfunction, and pain.  ACTIVITY LIMITATIONS: bending, lifting, carry, locomotion, cleaning, community activity, driving, and or occupation  PERSONAL FACTORS: Age, second surgical procedure to same location, past medical history are also affecting patient's functional outcome.  REHAB POTENTIAL: Good  CLINICAL DECISION MAKING: Stable/uncomplicated  EVALUATION COMPLEXITY: Low    SHORT TERM GOALS: 01/28/24  Pt will be independent and compliant with HEP for improved pain, strength, and function. Baseline: Goal status: MET 9/3 Target date:      2.  Pt will demo improved quality of gait with increased stance time and toe off on L.   Baseline:  Goal status: IN PROGRESS 9/3 Target date:     3.  Pt will progress with exercises per protocol without adverse effects for improved strength and mobility.   Baseline:  Goal status: IN PROGRESS 11/24 Target date:        LONG TERM GOALS: Target date: 04/28/2024     Pt will be able to ambulate extended community distance without significant difficulty and pain.   Baseline:  Goal status: MET 04/10/24    ~1 mile  2.  Pt will be able to perform her ADLs and IADLs including household chores without significant difficulty and pain. Baseline:  Goal status: IN PROGRESS 11/14   3.  Pt will be able to take care of her husband without significant limitations.   Baseline:  Goal status: Met 03/13/24   4.  Pt will be able to perform mini squats with good form and without increased pain in order for improved functional LE strength and to assist with returning to gardening activities.   Baseline:  Goal status: MET 11/24  Pt will report improved tolerance with standing activities and ambulation.  Baseline:  Goal status: IN PROGRESS 11/24 Target date:       5.  Pt will ambulate with a normalized heel to toe gait without limping.  Baseline:  Goal status: IN PROGRESS 11/24 Target date:      6.  Pt will be able to perform a 6 inch step up with good form and control with L LE leading for improved performance of stairs abd and improved functional strength. Baseline:  Goal status: Ongoing 04/10/24, pt has pain with 6, but can handle 4  Target date:  PLAN:   PT FREQUENCY:  2x/wk    PT DURATION: 4 weeks    PLANNED INTERVENTIONS (unless contraindicated): aquatic PT, Canalith repositioning, cryotherapy, Electrical stimulation, Iontophoresis with 4 mg/ml dexamethasome, Moist heat, traction, Ultrasound, gait training, Therapeutic exercise, balance training, neuromuscular re-education, patient/family education, prosthetic training, manual techniques, passive ROM, dry needling, taping, vasopnuematic device, vestibular, spinal manipulations, joint manipulations  PLAN FOR NEXT SESSION:  Cont per Dr. Danetta Patch med repair protocol.   Asberry Rodes, PTA  04/20/24 1:41 PM

## 2024-04-25 ENCOUNTER — Ambulatory Visit: Admitting: Sports Medicine

## 2024-04-25 ENCOUNTER — Ambulatory Visit (HOSPITAL_BASED_OUTPATIENT_CLINIC_OR_DEPARTMENT_OTHER): Payer: Self-pay | Admitting: Physical Therapy

## 2024-04-25 ENCOUNTER — Encounter (HOSPITAL_BASED_OUTPATIENT_CLINIC_OR_DEPARTMENT_OTHER): Payer: Self-pay | Admitting: Physical Therapy

## 2024-04-25 DIAGNOSIS — R262 Difficulty in walking, not elsewhere classified: Secondary | ICD-10-CM

## 2024-04-25 DIAGNOSIS — M6281 Muscle weakness (generalized): Secondary | ICD-10-CM

## 2024-04-25 DIAGNOSIS — M25552 Pain in left hip: Secondary | ICD-10-CM

## 2024-04-25 DIAGNOSIS — M25652 Stiffness of left hip, not elsewhere classified: Secondary | ICD-10-CM

## 2024-04-25 NOTE — Therapy (Signed)
 OUTPATIENT PHYSICAL THERAPY LOWER EXTREMITY TREATMENT        Patient Name: Ellen [name] MRN: [id number] DOB:05-28-1940, 83 y.o., female Today's Date: 04/25/2024  END OF SESSION:  PT End of Session - 04/25/24 1325     Visit Number 23    Number of Visits 24    Date for Recertification  04/28/24    Authorization Type MCR A & B    PT Start Time 1322                             Past Medical History:  Diagnosis Date   Allergy    Anxiety    Arthritis    Bursitis    left thigh   GERD (gastroesophageal reflux disease)    History of kidney stones    Hypertension    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   Left Buccal Cheek (Keratoacanthoma)   Umbilical hernia    Past Surgical History:  Procedure Laterality Date   BOWEL RESECTION  07/09/2013   Procedure: SMALL BOWEL RESECTION;  Surgeon: Krystal CHRISTELLA Spinner, MD;  Location: [facility];  Service: General;;   RODDIE NEVIN REPAIR Left 03/22/2023   Procedure: LEFT GLUTEUS MEDIUS REPAIR WITH POSSIBLE COLLAGEN PATCH AUGMENTATION;  Surgeon: Genelle Standing, MD;  Location: [facility];  Service: Orthopedics;  Laterality: Left;   GLUTEUS MINIMUS REPAIR Left 01/04/2024   Procedure: REPAIR, TENDON, GLUTEUS MINIMUS;  Surgeon: Genelle Standing, MD;  Location: [facility];  Service: Orthopedics;  Laterality: Left;  LEFT HIP GLUTEUS MAXIMUS TENDON TRANSFER WITH COLLAGEN PATCH AUGMENTATION   LAPAROTOMY N/A 07/09/2013   Procedure: EXPLORATORY LAPAROTOMY;  Surgeon: Krystal CHRISTELLA Spinner, MD;  Location: [facility];  Service: General;  Laterality: N/A;   SHOULDER ARTHROSCOPY WITH CAPSULORRHAPHY     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   Hypokalemia 12/27/2022   Volume depletion 12/24/2022   Constipation 12/24/2022   Partial small bowel obstruction  (HCC) 12/24/2022   Polycythemia 12/24/2022   Gastroesophageal reflux disease 08/26/2022   Bloating 08/26/2022   Small intestinal bacterial overgrowth (SIBO) 08/26/2022   Osteoarthritis 12/01/2021   Plantar wart 12/01/2021   Ventral hernia 01/01/2021   Diverticulosis 01/05/2019   Dermatitis 01/05/2019   Systolic murmur 07/25/2018   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   Hyperglycemia 10/05/2017   Leg edema 10/05/2017   Hammer toe 10/05/2017   Hot flashes due to menopause 10/05/2017   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/10/2013 07/11/2013   Hypertension 07/09/2013    PCP: [place] CHRISTELLA, MD  REFERRING PROVIDER: Genelle Standing, MD  REFERRING DIAG: Tendinopathy of gluteus medius [F32.040]   THERAPY DIAG:  Pain in left hip  Muscle weakness (generalized)  Stiffness of left hip, not elsewhere classified  Difficulty in walking, not elsewhere classified  Rationale for Evaluation and Treatment: Rehabilitation  ONSET DATE: 01/04/2024  SUBJECTIVE:   SUBJECTIVE STATEMENT:  Pt is 16 weeks post op.  Pt states the shockwave therapy helped the 1st time, but not the 2nd.  Pt states she was informed that insurance doesn't cover the shockwave therapy after 3 times.  She is scheduled for the third shockwave treatment on 12/22.  Pt states that PT and the injections help the most.  Pt has been out working this AM.  She has pain to the knee occasionally.  Pt reports compliance with HEP.  Pt denies any adverse effects after prior treatment.    PERTINENT HISTORY: L hip gluteus maximus tendon transfer and trochanteric bursectomy on 01/04/24 Glute min repair, Laparotomy, Shoulder arthroscopy with capsulorrhaphy.   PAIN:  Are you having pain? Yes: NPRS scale: 4-5/10   Pain location: lateral and posterolateral L hip Pain description: Constant pain Aggravating factors: Walking, standing, sitting, any hip flexion  Relieving factors: Ice.   PRECAUTIONS: Other: gute min, fall  risk.  RED FLAGS: None   WEIGHT BEARING RESTRICTIONS: Yes WBAT  FALLS:  Has patient fallen in last 6 months? No  LIVING ENVIRONMENT: Lives with: lives with their spouse Lives in: House/apartment Stairs: Yes: Internal: 12 steps; on right going up and External: 4 steps; bilateral but cannot reach both Has following equipment at home: Single point cane, Walker - 2 wheeled, Environmental Consultant - 4 wheeled, and Grab bars  OCCUPATION: Human resources officer.   PLOF: Independent  PATIENT GOALS: Pt would like to walk straight.   NEXT MD VISIT: 01/19/2024  OBJECTIVE:  Note: Objective measures were completed at Evaluation unless otherwise noted.  DIAGNOSTIC FINDINGS: IMPRESSION: 1. Progressive fluid signal tracking between the gluteus minimus and within and along the gluteus medius muscle extending down to moderate gluteus medius tendinopathy and peritendinitis. Mild to moderate left gluteus minimus tendinopathy distally. 2. Trace left trochanteric bursitis. 3. Moderate degenerative hip arthropathy bilaterally. 4. Proximal hamstring tendinopathy and likely partial tearing similar to prior. 5. Substantial loss of intervertebral disc height at L4-5 and L5-S1 with type 2 degenerative endplate findings eccentric to the right at L4-5. 6. Intramural uterine fibroids. 7. Sigmoid colon diverticulosis.  PATIENT SURVEYS:  25/80 LEFS: 11/24: 47/80  COGNITION: Overall cognitive status: Within functional limits for tasks assessed     SENSATION: WFL  EDEMA:  Localized swelling from surgery.   POSTURE: No Significant postural limitations  PALPATION: Tenderness to palpation at surgical site.   LOWER EXTREMITY ROM:  Passive ROM Right eval Left eval 02/26/24  04/10/24 Left 04/10/24 Right  Hip flexion   92 115 AROM 112 AROM  Hip extension       Hip abduction    9 initially (17 after stretching and STM) 33  Hip adduction       Hip internal rotation    29 31  Hip external rotation    24  26  Knee flexion       Knee extension       Ankle dorsiflexion       Ankle plantarflexion       Ankle inversion       Ankle eversion        (Blank rows = not tested)  LOWER EXTREMITY MMT:  MMT Right 11/24 Left 11/24  Hip flexion 4+ 4-  Hip extension    Hip abduction 4+ 4-  Hip adduction    Hip internal rotation 4+ 3+!  Hip external rotation 4+ 4-  Knee flexion    Knee extension    Ankle dorsiflexion    Ankle plantarflexion    Ankle inversion    Ankle eversion     (Blank rows = not tested)   GAIT: Distance walked: 36ft  Assistive device utilized: Walker - 2 wheeled Level of assistance: Complete Independence Comments: Slow, antalgic gait with PWB. (30% WB per pt).  TREATMENT DATE:   04/25/24 Pt received L hip PROM in flexion, Abd, ER, and IR in supine per pt and tissue tolerance. Supine bridge 2x12 LAQ 3# 2x15, 4# x10 Step ups 4 inch step 2x10 Step downs 4 inch step Mini squats with UE support on rail 2x10 reps Lateral band walks with YTB above knees x 3 laps Stool rotations  2x10  04/20/24 Pt received L hip PROM in flexion, Abd, ER, and IR in supine. Nustep lvl 4 x 5 mins bilat UE/[place] 2x10 Marching on airex with UE support on rail 2x15 Mini squats with UE support on rail 3x 10 reps Stool rotations 2x10 LAQ  3#   2x15   04/12/24 Pt received L hip PROM in flexion, Abd, ER, and IR in supine. Nustep lvl 5 x 5 mins bilat UE/LEs Marching on airex with UE support on rail 2x15 Step ups with rail support x 10 on 4 inch step, x 5 on 6 inch step Lateral band walks with YTB above knees x 3 laps Mini squats with UE support on rail x 10 reps Stool rotations 2x10 LAQ  3#   2x15    04/10/24 Nustep lvl 5 x 5 mins UE/LE's LEFS Updated ROM and MMT Goal review  PROM L hip abduction STM to L adductors Step ups 4 inch  2x10 Marching on airex 2x15 with UE support Mini squats with UE support on rail 1x10 LAQ  3#   2x15   04/06/24 Nustep lvl 5 x 5 mins UE/LE's Step ups 4 inch x10, 6 inch 1x8 Marching on airex 2x15 with UE support Mini squats with UE support on rail 2x10 Lateral band walks with YTB above knees x 2 laps Standing hip abduction 2#  2x15 bilat Step downs x 10 reps each on 2 inch and 4 inch step with UE support LAQ  3#   2x15  Pt received L hip PROM in flexion, ER, and IR in supine.  04/04/24 Nustep lvl 4-5 x 6 mins UE/LE's Mini squats with UE support on counter 2x10 Standing hip abduction 2#  2x15 bilat Step ups 4 inch 2x10 L LE, 1x10 R LE Lateral band walks with YTB above knees x 2 laps Marching on airex 2x15 with UE support Stool rotations 2x10 reps LAQ  3#   3x10 Supine bridge 2x10  03/29/24 Nustep lvl 4 x 5 mins UE/LE's Supine bridge 2x10 Pt received L hip PROM in flexion, ER, and IR in supine per pt and tissue tolerance.  Q-ped rocking x 10 reps Marching on airex 2x10 with UE support PT attempted mini squats with UE support on rail though PT stopped due to pain Sit to stands approx 4 reps with table elevated Sidestepping x 3 laps at rail with UE support Stool rotations x 10 reps   03/23/24 Nustep lvl 5, 5 mins UE/LE's  Pt received L hip PROM in flexion, abd, ER, and IR per pt and tissue tolerance.   LAQ 3# 3x10 Step ups 4 inch step 2x10 Marching on airex 2x10 with UE support    03/20/24 Nustep lvl 5, 5 min UE/ LE's Step ups 2x10 in 4 inch step Mini squats with UE support on rail 2x10 Sidestepping x 3 laps at rail with UE support Marching on airex 2x10 with UE support Stool rotations x 10 Standing hip abduction x 15 with 2# bilat LAQ  2# x12, 3# 2x10  Pt received L hip PROM in flexion, ER, and IR per pt and tissue tolerance.  03/13/24: Nustep lvl 5, 5 min UE/ LE's Standing hip abduction with 2# x15 each leg  Standing hip extension with 2# x15 each leg   Standing hip flexion with 2# x15 leg  Marching on airex pad to tolerance Step ups to 4 step with R LE only.  Marching on airex pad to tolerance Toe touch to cone  Marching over hurdles 6 laps    03/09/24: Nustep lvl 5, 5 min UE/ LE's LAQ with 2# 2x10  Standing hip abduction with 2# x15 each leg  Standing hip extension with 2# x15 each leg  Standing hip flexion with 2# x15 leg  Marching on airex pad to tolerance Step ups to 4 step with R LE only.       PATIENT EDUCATION:  Education details: relevant anatomy, exercise form, diagnosis, post op limitations and restrictions, HEP,  and POC. Person educated: Patient Education method: Medical Illustrator, verbal cues Education comprehension: verbalized understanding, returned demonstration, verbal cues required, and tactile cues required  HOME EXERCISE PROGRAM: Access Code: [email or web address] URL: [email or web address] Date: 01/08/2024 Prepared by: Rojean Batten    ASSESSMENT:  CLINICAL IMPRESSION:  Pt remains tight into passive IR/ER. She tolerated all PROM and strengthening well today without c/o pain. Able to ambulate short distances in clinic without SPC. Pt reports benefit from PT. Session ended a few minutes early due to pt having to pick up her husband.   Feels better, 2/10    OBJECTIVE IMPAIRMENTS: decreased activity tolerance, difficulty walking, decreased balance, decreased endurance, decreased mobility, decreased ROM, decreased strength, impaired flexibility, impaired UE/LE use, postural dysfunction, and pain.  ACTIVITY LIMITATIONS: bending, lifting, carry, locomotion, cleaning, community activity, driving, and or occupation  PERSONAL FACTORS: Age, second surgical procedure to same location, past medical history are also affecting patient's functional outcome.  REHAB POTENTIAL: Good  CLINICAL DECISION MAKING: Stable/uncomplicated  EVALUATION COMPLEXITY: Low    SHORT TERM GOALS:  01/28/24  Pt will be independent and compliant with HEP for improved pain, strength, and function. Baseline: Goal status: MET 9/3 Target date:      2.  Pt will demo improved quality of gait with increased stance time and toe off on L.   Baseline:  Goal status: IN PROGRESS 9/3 Target date:     3.  Pt will progress with exercises per protocol without adverse effects for improved strength and mobility.   Baseline:  Goal status: IN PROGRESS 11/24 Target date:       LONG TERM GOALS: Target date: 04/28/2024     Pt will be able to ambulate extended community distance without significant difficulty and pain.   Baseline:  Goal status: MET 04/10/24    ~1 mile  2.  Pt will be able to perform her ADLs and IADLs including household chores without significant difficulty and pain. Baseline:  Goal status: IN PROGRESS 11/14   3.  Pt will be able to take care of her husband without significant limitations.   Baseline:  Goal status: Met 03/13/24   4.  Pt will be able to perform mini squats with good form and without increased pain in order for improved functional LE strength and to assist with returning to gardening activities.   Baseline:  Goal status: MET 11/24  Pt will report improved tolerance with standing activities and ambulation.  Baseline:  Goal status: IN PROGRESS 11/24 Target date:       5.  Pt will ambulate with a normalized heel to toe gait without limping.  Baseline:  Goal  status: IN PROGRESS 11/24 Target date:      6.  Pt will be able to perform a 6 inch step up with good form and control with L LE leading for improved performance of stairs abd and improved functional strength. Baseline:  Goal status: Ongoing 04/10/24, pt has pain with 6, but can handle 4  Target date:       PLAN:   PT FREQUENCY:  2x/wk    PT DURATION: 4 weeks    PLANNED INTERVENTIONS (unless contraindicated): aquatic PT, Canalith repositioning, cryotherapy, Electrical stimulation,  Iontophoresis with 4 mg/ml dexamethasome, Moist heat, traction, Ultrasound, gait training, Therapeutic exercise, balance training, neuromuscular re-education, patient/family education, prosthetic training, manual techniques, passive ROM, dry needling, taping, vasopnuematic device, vestibular, spinal manipulations, joint manipulations  PLAN FOR NEXT SESSION:  Cont per Dr. Danetta Patch med repair protocol.   Asberry Rodes, PTA  04/25/24 1:26 PM

## 2024-04-26 ENCOUNTER — Ambulatory Visit (INDEPENDENT_AMBULATORY_CARE_PROVIDER_SITE_OTHER): Admitting: Orthopaedic Surgery

## 2024-04-26 DIAGNOSIS — M67959 Unspecified disorder of synovium and tendon, unspecified thigh: Secondary | ICD-10-CM | POA: Diagnosis not present

## 2024-04-26 NOTE — Progress Notes (Signed)
 Post Operative Evaluation    Procedure/Date of Surgery: Left hip gluteus maximus tendon transfer 8/19  Interval History:   Presents today for follow-up of her left hip.  At this time she did get some temporary relief from the shockwave therapy as well as from my previous injections.  I do believe she is continuing to improve with physical therapy which is reassuring  PMH/PSH/Family History/Social History/Meds/Allergies:    Past Medical History:  Diagnosis Date   Allergy    Anxiety    Arthritis    Bursitis    left thigh   GERD (gastroesophageal reflux disease)    History of kidney stones    Hypertension    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   Left Buccal Cheek (Keratoacanthoma)   Umbilical hernia    Past Surgical History:  Procedure Laterality Date   BOWEL RESECTION  07/09/2013   Procedure: SMALL BOWEL RESECTION;  Surgeon: Krystal CHRISTELLA Spinner, MD;  Location: [facility];  Service: General;;   RODDIE NEVIN REPAIR Left 03/22/2023   Procedure: LEFT GLUTEUS MEDIUS REPAIR WITH POSSIBLE COLLAGEN PATCH AUGMENTATION;  Surgeon: Genelle Standing, MD;  Location: [facility];  Service: Orthopedics;  Laterality: Left;   GLUTEUS MINIMUS REPAIR Left 01/04/2024   Procedure: REPAIR, TENDON, GLUTEUS MINIMUS;  Surgeon: Genelle Standing, MD;  Location: [facility];  Service: Orthopedics;  Laterality: Left;  LEFT HIP GLUTEUS MAXIMUS TENDON TRANSFER WITH COLLAGEN PATCH AUGMENTATION   LAPAROTOMY N/A 07/09/2013   Procedure: EXPLORATORY LAPAROTOMY;  Surgeon: Krystal CHRISTELLA Spinner, MD;  Location: [facility];  Service: General;  Laterality: N/A;   SHOULDER ARTHROSCOPY WITH CAPSULORRHAPHY     Social History   Socioeconomic History   Marital status: Married    Spouse name: Not on file   Number of children: 0   Years of education: Not on file   Highest education level: Not on file  Occupational History   Occupation: Retired   Tobacco Use    Smoking status: Former    Types: Cigarettes   Smokeless tobacco: Never  Vaping Use   Vaping status: Never Used  Substance and Sexual Activity   Alcohol use: Yes    Alcohol/week: 7.0 standard drinks of alcohol    Types: 7 Glasses of wine per week    Comment: nightly glass of wine   Drug use: No   Sexual activity: Not Currently  Other Topics Concern   Not on file  Social History Narrative   Enjoys gardening    Social Drivers of Health   Financial Resource Strain: Low Risk  (07/22/2023)   Overall Financial Resource Strain (CARDIA)    Difficulty of Paying Living Expenses: Not hard at all  Food Insecurity: No Food Insecurity (07/22/2023)   Hunger Vital Sign    Worried About Running Out of Food in the Last Year: Never true    Ran Out of Food in the Last Year: Never true  Transportation Needs: No Transportation Needs (07/22/2023)   PRAPARE - Administrator, Civil Service (Medical): No    Lack of Transportation (Non-Medical): No  Physical Activity: Sufficiently Active (07/22/2023)   Exercise Vital Sign    Days of Exercise per Week: 5 days    Minutes of Exercise per Session: 120 min  Stress: No Stress Concern Present (07/22/2023)  [employer] of Occupational Health - Occupational Stress Questionnaire    Feeling of Stress : Not at all  Social Connections: Moderately Integrated (07/22/2023)   Social Connection and Isolation Panel    Frequency of Communication with Friends and Family: Once a week    Frequency of Social Gatherings with Friends and Family: More than three times a week    Attends Religious Services: More than 4 times per year    Active Member of [employer] or Organizations: No    Attends Engineer, Structural: Never    Marital Status: Married   Family History  Problem Relation Age of Onset   Diabetes Mother    Heart attack Mother    CAD Father    Heart attack Father    Breast cancer Neg Hx    Colon cancer Neg Hx    Esophageal cancer Neg Hx    Rectal  cancer Neg Hx    Stomach cancer Neg Hx    Allergies  Allergen Reactions   Lactose Intolerance (Gi) Diarrhea   Sulfa Antibiotics Nausea And Vomiting and Swelling   Current Outpatient Medications  Medication Sig Dispense Refill   aspirin  EC 325 MG tablet Take 1 tablet (325 mg total) by mouth daily. 14 tablet 0   Calcium  Carbonate Antacid (TUMS PO) Take 1 tablet by mouth as needed (reflux).     Carboxymethylcellulose Sodium (EYE DROPS OP) Place 2 drops into both eyes daily. Thera Tears     furosemide  (LASIX ) 40 MG tablet Take 1 tablet (40 mg total) by mouth daily. 90 tablet 0   hydrochlorothiazide  (MICROZIDE ) 12.5 MG capsule TAKE 1 CAPSULE(12.5 MG) BY MOUTH DAILY 90 capsule 0   losartan  (COZAAR ) 50 MG tablet TAKE 1 TABLET(50 MG) BY MOUTH DAILY 90 tablet 0   meloxicam  (MOBIC ) 15 MG tablet Take 1 tablet (15 mg total) by mouth daily. 30 tablet 1   methylPREDNISolone  (MEDROL  DOSEPAK) 4 MG TBPK tablet Take per packet instructions 21 tablet 0   pantoprazole  (PROTONIX ) 40 MG tablet TAKE 1 TABLET(40 MG) BY MOUTH DAILY 90 tablet 0   No current facility-administered medications for this visit.   No results found.  Review of Systems:   A ROS was performed including pertinent positives and negatives as documented in the HPI.   Musculoskeletal Exam:    There were no vitals taken for this visit.  Left hip incision is well-appearing without erythema or drainage.  There is no redness or erythema.  Walks with mildly antalgic gait distal exam is intact  Imaging:      I personally reviewed and interpreted the radiographs.   Assessment:   Status post left hip gluteus maximus tendon transfer overall doing well but with some residual pain.  I did discuss that overall I do believe she will continue to progress with physical therapy and likely will eventually achieve a relatively pain-free state.  Given that and the short acting response from both the injections and the shockwave I did discuss that at  this time I do believe that physical therapy and strengthening are likely her best long-term solution.  I will plan to see her back in 12 weeks for reassessment.  She does have 1 additional shockwave therapy session remaining Plan :    - Return 12 weeks for reassessment     I personally saw and evaluated the patient, and participated in the management and treatment plan.  Elspeth Parker, MD Attending Physician, Orthopedic Surgery  This document was dictated using Dragon voice  recognition software. A reasonable attempt at proof reading has been made to minimize errors.

## 2024-04-27 ENCOUNTER — Encounter (HOSPITAL_BASED_OUTPATIENT_CLINIC_OR_DEPARTMENT_OTHER): Payer: Self-pay

## 2024-04-27 ENCOUNTER — Ambulatory Visit (HOSPITAL_BASED_OUTPATIENT_CLINIC_OR_DEPARTMENT_OTHER): Payer: Self-pay

## 2024-04-27 DIAGNOSIS — M6281 Muscle weakness (generalized): Secondary | ICD-10-CM

## 2024-04-27 DIAGNOSIS — M25652 Stiffness of left hip, not elsewhere classified: Secondary | ICD-10-CM

## 2024-04-27 DIAGNOSIS — R262 Difficulty in walking, not elsewhere classified: Secondary | ICD-10-CM

## 2024-04-27 DIAGNOSIS — M25552 Pain in left hip: Secondary | ICD-10-CM | POA: Diagnosis not present

## 2024-04-27 NOTE — Therapy (Signed)
 OUTPATIENT PHYSICAL THERAPY LOWER EXTREMITY TREATMENT        Patient Name: Ellen [name] MRN: [id number] DOB:[birth year], 83 y.o., female Today's Date: 04/27/2024  END OF SESSION:  PT End of Session - 04/27/24 1012     Visit Number 24    Number of Visits 24    Date for Recertification  04/28/24    Authorization Type MCR A & B    Progress Note Due on Visit 30    PT Start Time 0934    PT Stop Time 1022    PT Time Calculation (min) 48 min    Activity Tolerance Patient tolerated treatment well    Behavior During Therapy [facility] for tasks assessed/performed                              Past Medical History:  Diagnosis Date   Allergy    Anxiety    Arthritis    Bursitis    left thigh   GERD (gastroesophageal reflux disease)    History of kidney stones    Hypertension    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   Left Buccal Cheek (Keratoacanthoma)   Umbilical hernia    Past Surgical History:  Procedure Laterality Date   BOWEL RESECTION  07/09/2013   Procedure: SMALL BOWEL RESECTION;  Surgeon: Krystal CHRISTELLA Spinner, MD;  Location: [facility];  Service: General;;   RODDIE NEVIN REPAIR Left 03/22/2023   Procedure: LEFT GLUTEUS MEDIUS REPAIR WITH POSSIBLE COLLAGEN PATCH AUGMENTATION;  Surgeon: Genelle Standing, MD;  Location: [facility];  Service: Orthopedics;  Laterality: Left;   GLUTEUS MINIMUS REPAIR Left 01/04/2024   Procedure: REPAIR, TENDON, GLUTEUS MINIMUS;  Surgeon: Genelle Standing, MD;  Location: [facility];  Service: Orthopedics;  Laterality: Left;  LEFT HIP GLUTEUS MAXIMUS TENDON TRANSFER WITH COLLAGEN PATCH AUGMENTATION   LAPAROTOMY N/A 07/09/2013   Procedure: EXPLORATORY LAPAROTOMY;  Surgeon: Krystal CHRISTELLA Spinner, MD;  Location: [facility];  Service: General;  Laterality: N/A;   SHOULDER ARTHROSCOPY WITH CAPSULORRHAPHY     Patient Active Problem List   Diagnosis Date Noted   B12 deficiency 07/30/2023   Skin ulcer of  ankle, limited to breakdown of skin (HCC) 04/28/2023   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   Hypokalemia 12/27/2022   Volume depletion 12/24/2022   Constipation 12/24/2022   Partial small bowel obstruction (HCC) 12/24/2022   Polycythemia 12/24/2022   Gastroesophageal reflux disease 08/26/2022   Bloating 08/26/2022   Small intestinal bacterial overgrowth (SIBO) 08/26/2022   Osteoarthritis 12/01/2021   Plantar wart 12/01/2021   Ventral hernia 01/01/2021   Diverticulosis 01/05/2019   Dermatitis 01/05/2019   Systolic murmur 07/25/2018   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   Hyperglycemia 10/05/2017   Leg edema 10/05/2017   Hammer toe 10/05/2017   Hot flashes due to menopause 10/05/2017   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/10/2013 07/11/2013   Hypertension 07/09/2013    PCP: Kennyth Worth CHRISTELLA, MD  REFERRING PROVIDER: Genelle Standing, MD  REFERRING DIAG: Tendinopathy of gluteus medius [F32.040]   THERAPY DIAG:  Pain in left hip  Muscle weakness (generalized)  Stiffness of left hip, not elsewhere classified  Difficulty in walking, not elsewhere classified  Rationale for Evaluation and Treatment: Rehabilitation  ONSET DATE: 01/04/2024  SUBJECTIVE:   SUBJECTIVE STATEMENT:  Pt saw MD for f/u. Pt c/o ongoing   PERTINENT HISTORY: L hip gluteus maximus tendon transfer and trochanteric bursectomy on  01/04/24 Glute min repair, Laparotomy, Shoulder arthroscopy with capsulorrhaphy.   PAIN:  Are you having pain? Yes: NPRS scale: 4-5/10   Pain location: lateral and posterolateral L hip Pain description: Constant pain Aggravating factors: Walking, standing, sitting, any hip flexion  Relieving factors: Ice.   PRECAUTIONS: Other: gute min, fall risk.  RED FLAGS: None   WEIGHT BEARING RESTRICTIONS: Yes WBAT  FALLS:  Has patient fallen in last 6 months? No  LIVING ENVIRONMENT: Lives with: lives with their spouse Lives in:  House/apartment Stairs: Yes: Internal: 12 steps; on right going up and External: 4 steps; bilateral but cannot reach both Has following equipment at home: Single point cane, Walker - 2 wheeled, Environmental Consultant - 4 wheeled, and Grab bars  OCCUPATION: Human resources officer.   PLOF: Independent  PATIENT GOALS: Pt would like to walk straight.   NEXT MD VISIT: 01/19/2024  OBJECTIVE:  Note: Objective measures were completed at Evaluation unless otherwise noted.  DIAGNOSTIC FINDINGS: IMPRESSION: 1. Progressive fluid signal tracking between the gluteus minimus and within and along the gluteus medius muscle extending down to moderate gluteus medius tendinopathy and peritendinitis. Mild to moderate left gluteus minimus tendinopathy distally. 2. Trace left trochanteric bursitis. 3. Moderate degenerative hip arthropathy bilaterally. 4. Proximal hamstring tendinopathy and likely partial tearing similar to prior. 5. Substantial loss of intervertebral disc height at L4-5 and L5-S1 with type 2 degenerative endplate findings eccentric to the right at L4-5. 6. Intramural uterine fibroids. 7. Sigmoid colon diverticulosis.  PATIENT SURVEYS:  25/80 LEFS: 11/24: 47/80  COGNITION: Overall cognitive status: Within functional limits for tasks assessed     SENSATION: WFL  EDEMA:  Localized swelling from surgery.   POSTURE: No Significant postural limitations  PALPATION: Tenderness to palpation at surgical site.   LOWER EXTREMITY ROM:  Passive ROM Right eval Left eval 02/26/24  04/10/24 Left 04/10/24 Right  Hip flexion   92 115 AROM 112 AROM  Hip extension       Hip abduction    9 initially (17 after stretching and STM) 33  Hip adduction       Hip internal rotation    29 31  Hip external rotation    24 26  Knee flexion       Knee extension       Ankle dorsiflexion       Ankle plantarflexion       Ankle inversion       Ankle eversion        (Blank rows = not tested)  LOWER EXTREMITY  MMT:  MMT Right 11/24 Left 11/24  Hip flexion 4+ 4-  Hip extension    Hip abduction 4+ 4-  Hip adduction    Hip internal rotation 4+ 3+!  Hip external rotation 4+ 4-  Knee flexion    Knee extension    Ankle dorsiflexion    Ankle plantarflexion    Ankle inversion    Ankle eversion     (Blank rows = not tested)   GAIT: Distance walked: 74ft  Assistive device utilized: Walker - 2 wheeled Level of assistance: Complete Independence Comments: Slow, antalgic gait with PWB. (30% WB per pt).  TREATMENT DATE:   04/27/24 PROM L hip STM to L gluteal mm Supine bridge 2x10 Hooklying marching 2# 2x10 Hooklying adductor squeeze 5 x20 Windshield wiper stretch 5 x10ea LAQ  4# 2x10 Step ups 4 inch step 2x10 Standing hip abduction 2x10bil   04/25/24 Pt received L hip PROM in flexion, Abd, ER, and IR in supine per pt and tissue tolerance. Supine bridge 2x12 LAQ 3# 2x15, 4# x10 Step ups 4 inch step 2x10 Step downs 4 inch step Mini squats with UE support on rail 2x10 reps Lateral band walks with YTB above knees x 3 laps Stool rotations  2x10  04/20/24 Pt received L hip PROM in flexion, Abd, ER, and IR in supine. Nustep lvl 4 x 5 mins bilat UE/[place] 2x10 Marching on airex with UE support on rail 2x15 Mini squats with UE support on rail 3x 10 reps Stool rotations 2x10 LAQ  3#   2x15   04/12/24 Pt received L hip PROM in flexion, Abd, ER, and IR in supine. Nustep lvl 5 x 5 mins bilat UE/LEs Marching on airex with UE support on rail 2x15 Step ups with rail support x 10 on 4 inch step, x 5 on 6 inch step Lateral band walks with YTB above knees x 3 laps Mini squats with UE support on rail x 10 reps Stool rotations 2x10 LAQ  3#   2x15     PATIENT EDUCATION:  Education details: relevant anatomy, exercise form, diagnosis, post op limitations  and restrictions, HEP,  and POC. Person educated: Patient Education method: Medical Illustrator, verbal cues Education comprehension: verbalized understanding, returned demonstration, verbal cues required, and tactile cues required  HOME EXERCISE PROGRAM: Access Code: [email or web address] URL: [email or web address] Date: 01/08/2024 Prepared by: Rojean Batten    ASSESSMENT:  CLINICAL IMPRESSION:  Pt remains tight and tender into L gluteal mm. Utilized manual techniques to this area to improve this. Followed this by passive IR/ER as pt continues to lack mobility here. Strengthening focused on hip mm in all planes. She is challenged with hip abduction movements due to mild discomfort in L hip. Pt will benefit from continued PT to continue progressing strength and working towards improved mobility.     OBJECTIVE IMPAIRMENTS: decreased activity tolerance, difficulty walking, decreased balance, decreased endurance, decreased mobility, decreased ROM, decreased strength, impaired flexibility, impaired UE/LE use, postural dysfunction, and pain.  ACTIVITY LIMITATIONS: bending, lifting, carry, locomotion, cleaning, community activity, driving, and or occupation  PERSONAL FACTORS: Age, second surgical procedure to same location, past medical history are also affecting patient's functional outcome.  REHAB POTENTIAL: Good  CLINICAL DECISION MAKING: Stable/uncomplicated  EVALUATION COMPLEXITY: Low    SHORT TERM GOALS: 01/28/24  Pt will be independent and compliant with HEP for improved pain, strength, and function. Baseline: Goal status: MET 9/3 Target date:      2.  Pt will demo improved quality of gait with increased stance time and toe off on L.   Baseline:  Goal status: IN PROGRESS 9/3 Target date:     3.  Pt will progress with exercises per protocol without adverse effects for improved strength and mobility.   Baseline:  Goal status: IN PROGRESS 11/24 Target date:        LONG TERM GOALS: Target date: 04/28/2024     Pt will be able to ambulate extended community distance without significant difficulty and pain.   Baseline:  Goal status: MET 04/10/24    ~1 mile  2.  Pt will be able to perform  her ADLs and IADLs including household chores without significant difficulty and pain. Baseline:  Goal status: IN PROGRESS 11/14   3.  Pt will be able to take care of her husband without significant limitations.   Baseline:  Goal status: Met 03/13/24   4.  Pt will be able to perform mini squats with good form and without increased pain in order for improved functional LE strength and to assist with returning to gardening activities.   Baseline:  Goal status: MET 11/24  Pt will report improved tolerance with standing activities and ambulation.  Baseline:  Goal status: IN PROGRESS 11/24 Target date:       5.  Pt will ambulate with a normalized heel to toe gait without limping.  Baseline:  Goal status: IN PROGRESS 11/24 Target date:      6.  Pt will be able to perform a 6 inch step up with good form and control with L LE leading for improved performance of stairs abd and improved functional strength. Baseline:  Goal status: Ongoing 04/10/24, pt has pain with 6, but can handle 4  Target date:       PLAN:   PT FREQUENCY:  2x/wk    PT DURATION: 4 weeks    PLANNED INTERVENTIONS (unless contraindicated): aquatic PT, Canalith repositioning, cryotherapy, Electrical stimulation, Iontophoresis with 4 mg/ml dexamethasome, Moist heat, traction, Ultrasound, gait training, Therapeutic exercise, balance training, neuromuscular re-education, patient/family education, prosthetic training, manual techniques, passive ROM, dry needling, taping, vasopnuematic device, vestibular, spinal manipulations, joint manipulations  PLAN FOR NEXT SESSION:  Cont per Dr. Danetta Patch med repair protocol.   Asberry Rodes, PTA  04/27/2024 10:29 AM

## 2024-04-28 ENCOUNTER — Other Ambulatory Visit: Payer: Self-pay | Admitting: Family Medicine

## 2024-04-28 ENCOUNTER — Other Ambulatory Visit: Payer: Self-pay | Admitting: *Deleted

## 2024-04-28 DIAGNOSIS — K2970 Gastritis, unspecified, without bleeding: Secondary | ICD-10-CM

## 2024-04-28 DIAGNOSIS — N951 Menopausal and female climacteric states: Secondary | ICD-10-CM

## 2024-04-28 MED ORDER — PANTOPRAZOLE SODIUM 40 MG PO TBEC: 40.0000 mg | DELAYED_RELEASE_TABLET | Freq: Every day | ORAL | 0 refills | Status: DC

## 2024-04-28 MED ORDER — GABAPENTIN 100 MG PO CAPS: 100.0000 mg | ORAL_CAPSULE | Freq: Every day | ORAL | 3 refills | Status: DC

## 2024-04-28 NOTE — Assessment & Plan Note (Signed)
 Patient was seen with her husband today.  She has been off of her Prempro  for the last year or so has noticed significant increase in hot flashes.  She would like to discuss other potential options for management for her hot flashes.  We discussed potential options.  Will start gabapentin  100 mg nightly.  She is aware potential side effects.  She will follow-up with us  in a few weeks and we can adjust as needed.

## 2024-05-08 ENCOUNTER — Encounter: Payer: Self-pay | Admitting: Sports Medicine

## 2024-05-08 ENCOUNTER — Ambulatory Visit: Admitting: Sports Medicine

## 2024-05-08 DIAGNOSIS — G8929 Other chronic pain: Secondary | ICD-10-CM

## 2024-05-08 DIAGNOSIS — M67959 Unspecified disorder of synovium and tendon, unspecified thigh: Secondary | ICD-10-CM

## 2024-05-08 DIAGNOSIS — M25552 Pain in left hip: Secondary | ICD-10-CM | POA: Diagnosis not present

## 2024-05-08 NOTE — Progress Notes (Signed)
 Patient says she thinks she is better, although the shockwave therapy does not seem to be helping. She is still in physical therapy and plans to continue going. She is asking whether an occasional injection would be helpful. She says that her pain has moved from her leg up to her hip, which is a new location and more intense. She states that she still has pain when bending over.

## 2024-05-08 NOTE — Progress Notes (Signed)
 "  Ellen [name] - 83 y.o. female MRN [id number]  Date of birth: Jan 19, 1941  Office Visit Note: Visit Date: 05/08/2024 PCP: Kennyth Worth HERO, MD Referred by: Kennyth Worth HERO, MD  Subjective: Chief Complaint  Patient presents with   Left Hip - Follow-up   HPI: Ellen [name] is a pleasant 83 y.o. female who presents today for follow-up of left lateral hip pain.  Therisa states she believes she is better, although unsure the exact improvement with shockwave therapy specifically. She is still in physical therapy and plans to continue going. She is asking whether an occasional injection would be helpful. She says that her pain has moved from her leg up to her hip, which is a new location and more intense. She states that she still has pain when bending over.  She does note improvement in both pain and function going up and down steps, is now able to do this forward although is still using both handrails.  He is using 1 tablet of Aleve in the morning and then Tylenol  in the afternoon.  She is status post (2nd surgery) left hip gluteus maximus tendon transfer on 01/04/2024 with Dr. Genelle   Pertinent ROS were reviewed with the patient and found to be negative unless otherwise specified above in HPI.   Assessment & Plan: Visit Diagnoses:  1. Chronic left hip pain   2. Tendinopathy of gluteus medius    Plan: Impression is improving chronic left lateral hip pain in the setting of previous gluteus maximus tendon transfer x 2.  Her strength is much better and she still has some pain although overall has been making improvements.  Discussed the overall nature and healing process from her surgeries.  I do agree she would benefit from continued physical therapy.  She did not notice significant relief from shockwave therapy, although I do think this created overall recovery.  At this point she will discontinue meloxicam  15 mg as not finding it beneficial but may continue her once daily Aleve and  Tylenol  as needed.  Did discuss the role for intermittent injection only for pain control as needed, but we will hold on this for now and likely progress through PT.  She will follow-up with me as needed.  Would clear injection with operating surgeon, Dr. Genelle, if to pursue in the future     Follow-up: Return if symptoms worsen or fail to improve.   Meds & Orders: No orders of the defined types were placed in this encounter.  No orders of the defined types were placed in this encounter.    Procedures: No procedures performed      Clinical History: No specialty comments available.  She reports that she has quit smoking. Her smoking use included cigarettes. She has never used smokeless tobacco.  Recent Labs    06/28/23 1131  HGBA1C 5.4    Objective:    Physical Exam  Gen: Well-appearing, in no acute distress; non-toxic CV: Well-perfused. Warm.  Resp: Breathing unlabored on room air; no wheezing. Psych: Fluid speech in conversation; appropriate affect; normal thought process  Ortho Exam - Left hip: There is some tenderness with palpation of the greater trochanteric region and the TFL location although no evidence of bursitis or significant swelling.  There is improved hip abduction strength which is nearly equivocal to contralateral side.  Imaging: No results found.  Past Medical/Family/Surgical/Social History: Medications & Allergies reviewed per EMR, new medications updated. 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   Hypokalemia 12/27/2022   Volume depletion 12/24/2022   Constipation 12/24/2022   Partial small bowel obstruction (HCC) 12/24/2022   Polycythemia 12/24/2022   Gastroesophageal reflux disease 08/26/2022   Bloating 08/26/2022   Small intestinal bacterial overgrowth (SIBO) 08/26/2022   Osteoarthritis  12/01/2021   Plantar wart 12/01/2021   Ventral hernia 01/01/2021   Diverticulosis 01/05/2019   Dermatitis 01/05/2019   Systolic murmur 07/25/2018   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   Hyperglycemia 10/05/2017   Leg edema 10/05/2017   Hammer toe 10/05/2017   Hot flashes due to menopause 10/05/2017   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/10/2013 07/11/2013   Hypertension 07/09/2013   Past Medical History:  Diagnosis Date   Allergy    Anxiety    Arthritis    Bursitis    left thigh   GERD (gastroesophageal reflux disease)    History of kidney stones    Hypertension    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   Left Buccal Cheek (Keratoacanthoma)   Umbilical hernia    Family History  Problem Relation Age of Onset   Diabetes Mother    Heart attack Mother    CAD Father    Heart attack Father    Breast cancer Neg Hx    Colon cancer Neg Hx    Esophageal cancer Neg Hx    Rectal cancer Neg Hx    Stomach cancer Neg Hx    Past Surgical History:  Procedure Laterality Date   BOWEL RESECTION  07/09/2013   Procedure: SMALL BOWEL RESECTION;  Surgeon: Krystal CHRISTELLA Spinner, MD;  Location: [facility];  Service: General;;   RODDIE NEVIN REPAIR Left 03/22/2023   Procedure: LEFT GLUTEUS MEDIUS REPAIR WITH POSSIBLE COLLAGEN PATCH AUGMENTATION;  Surgeon: Genelle Standing, MD;  Location: [facility];  Service: Orthopedics;  Laterality: Left;   GLUTEUS MINIMUS REPAIR Left 01/04/2024   Procedure: REPAIR, TENDON, GLUTEUS MINIMUS;  Surgeon: Genelle Standing, MD;  Location: [facility];  Service: Orthopedics;  Laterality: Left;  LEFT HIP GLUTEUS MAXIMUS TENDON TRANSFER WITH COLLAGEN PATCH AUGMENTATION   LAPAROTOMY N/A 07/09/2013   Procedure: EXPLORATORY LAPAROTOMY;  Surgeon: Krystal CHRISTELLA Spinner, MD;  Location: [facility];  Service: General;  Laterality: N/A;   SHOULDER ARTHROSCOPY WITH CAPSULORRHAPHY     Social History   Occupational History   Occupation:  Retired   Tobacco Use   Smoking status: Former    Types: Cigarettes   Smokeless tobacco: Never  Vaping Use   Vaping status: Never Used  Substance and Sexual Activity   Alcohol use: Yes    Alcohol/week: 7.0 standard drinks of alcohol    Types: 7 Glasses of wine per week    Comment: nightly glass of wine   Drug use: No   Sexual activity: Not Currently   "

## 2024-05-09 ENCOUNTER — Ambulatory Visit (HOSPITAL_BASED_OUTPATIENT_CLINIC_OR_DEPARTMENT_OTHER): Payer: Self-pay | Admitting: Physical Therapy

## 2024-05-09 ENCOUNTER — Encounter (HOSPITAL_BASED_OUTPATIENT_CLINIC_OR_DEPARTMENT_OTHER): Payer: Self-pay | Admitting: Physical Therapy

## 2024-05-09 DIAGNOSIS — M25552 Pain in left hip: Secondary | ICD-10-CM

## 2024-05-09 DIAGNOSIS — M6281 Muscle weakness (generalized): Secondary | ICD-10-CM

## 2024-05-09 DIAGNOSIS — M25652 Stiffness of left hip, not elsewhere classified: Secondary | ICD-10-CM

## 2024-05-09 DIAGNOSIS — R293 Abnormal posture: Secondary | ICD-10-CM

## 2024-05-09 DIAGNOSIS — R262 Difficulty in walking, not elsewhere classified: Secondary | ICD-10-CM

## 2024-05-09 NOTE — Therapy (Addendum)
 " OUTPATIENT PHYSICAL THERAPY LOWER EXTREMITY TREATMENT/ PROGRESS- Recert  Progress Note Reporting Period 01/08/24 to 05/09/24  See note below for Objective Data and Assessment of Progress/Goals.            Patient Name: Ellen [name] MRN: [id number] DOB:11-04-1940, 83 y.o., female Today's Date: 05/09/2024  END OF SESSION:  PT End of Session - 05/09/24 1014     Visit Number 25    Date for Recertification  06/23/24    Authorization Type MCR A & B    Progress Note Due on Visit 30    PT Start Time 1015    PT Stop Time 1058    PT Time Calculation (min) 43 min    Activity Tolerance Patient tolerated treatment well    Behavior During Therapy WFL for tasks assessed/performed                               Past Medical History:  Diagnosis Date   Allergy    Anxiety    Arthritis    Bursitis    left thigh   GERD (gastroesophageal reflux disease)    History of kidney stones    Hypertension    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   Left Buccal Cheek (Keratoacanthoma)   Umbilical hernia    Past Surgical History:  Procedure Laterality Date   BOWEL RESECTION  07/09/2013   Procedure: SMALL BOWEL RESECTION;  Surgeon: Krystal CHRISTELLA Spinner, MD;  Location: [facility];  Service: General;;   RODDIE NEVIN REPAIR Left 03/22/2023   Procedure: LEFT GLUTEUS MEDIUS REPAIR WITH POSSIBLE COLLAGEN PATCH AUGMENTATION;  Surgeon: Genelle Standing, MD;  Location: [facility];  Service: Orthopedics;  Laterality: Left;   GLUTEUS MINIMUS REPAIR Left 01/04/2024   Procedure: REPAIR, TENDON, GLUTEUS MINIMUS;  Surgeon: Genelle Standing, MD;  Location: [facility];  Service: Orthopedics;  Laterality: Left;  LEFT HIP GLUTEUS MAXIMUS TENDON TRANSFER WITH COLLAGEN PATCH AUGMENTATION   LAPAROTOMY N/A 07/09/2013   Procedure: EXPLORATORY LAPAROTOMY;  Surgeon: Krystal CHRISTELLA Spinner, MD;  Location: [facility];  Service: General;  Laterality: N/A;   SHOULDER  ARTHROSCOPY WITH CAPSULORRHAPHY     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   Hypokalemia 12/27/2022   Volume depletion 12/24/2022   Constipation 12/24/2022   Partial small bowel obstruction (HCC) 12/24/2022   Polycythemia 12/24/2022   Gastroesophageal reflux disease 08/26/2022   Bloating 08/26/2022   Small intestinal bacterial overgrowth (SIBO) 08/26/2022   Osteoarthritis 12/01/2021   Plantar wart 12/01/2021   Ventral hernia 01/01/2021   Diverticulosis 01/05/2019   Dermatitis 01/05/2019   Systolic murmur 07/25/2018   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   Hyperglycemia 10/05/2017   Leg edema 10/05/2017   Hammer toe 10/05/2017   Hot flashes due to menopause 10/05/2017   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/10/2013 07/11/2013   Hypertension 07/09/2013    PCP: [place] CHRISTELLA, MD  REFERRING PROVIDER: Genelle Standing, MD  REFERRING DIAG: Tendinopathy of gluteus medius [F32.040]   THERAPY DIAG:  Pain in left hip  Muscle weakness (generalized)  Stiffness of left hip, not elsewhere classified  Difficulty in walking, not elsewhere classified  Abnormal posture  Rationale for Evaluation and Treatment: Rehabilitation  ONSET DATE: 01/04/2024  SUBJECTIVE:   SUBJECTIVE STATEMENT:  Everything is the same. I did my exercises all week last week.    PERTINENT HISTORY: L hip gluteus maximus tendon transfer and trochanteric bursectomy on 01/04/24 Glute min repair, Laparotomy, Shoulder arthroscopy with capsulorrhaphy.   PAIN:  Are you having pain? Yes: NPRS scale: 4-5/10   Pain location: lateral and posterolateral L hip Pain description: Constant pain Aggravating factors: Walking, standing, sitting, any hip flexion  Relieving factors: Ice.   PRECAUTIONS: Other: gute min, fall risk.  RED  FLAGS: None   WEIGHT BEARING RESTRICTIONS: Yes WBAT  FALLS:  Has patient fallen in last 6 months? No  LIVING ENVIRONMENT: Lives with: lives with their spouse Lives in: House/apartment Stairs: Yes: Internal: 12 steps; on right going up and External: 4 steps; bilateral but cannot reach both Has following equipment at home: Single point cane, Walker - 2 wheeled, Environmental Consultant - 4 wheeled, and Grab bars  OCCUPATION: Human resources officer.   PLOF: Independent  PATIENT GOALS: Pt would like to walk straight.   NEXT MD VISIT: 01/19/2024  OBJECTIVE:  Note: Objective measures were completed at Evaluation unless otherwise noted.  DIAGNOSTIC FINDINGS: IMPRESSION: 1. Progressive fluid signal tracking between the gluteus minimus and within and along the gluteus medius muscle extending down to moderate gluteus medius tendinopathy and peritendinitis. Mild to moderate left gluteus minimus tendinopathy distally. 2. Trace left trochanteric bursitis. 3. Moderate degenerative hip arthropathy bilaterally. 4. Proximal hamstring tendinopathy and likely partial tearing similar to prior. 5. Substantial loss of intervertebral disc height at L4-5 and L5-S1 with type 2 degenerative endplate findings eccentric to the right at L4-5. 6. Intramural uterine fibroids. 7. Sigmoid colon diverticulosis.  PATIENT SURVEYS:  25/80 LEFS: 11/24: 47/80  COGNITION: Overall cognitive status: Within functional limits for tasks assessed     SENSATION: WFL  EDEMA:  Localized swelling from surgery.   POSTURE: No Significant postural limitations  PALPATION: Tenderness to palpation at surgical site.   LOWER EXTREMITY ROM:  Passive ROM Right eval Left eval 02/26/24  04/10/24 Left 04/10/24 Right 05/09/24 R  Hip flexion   92 115 AROM 112 AROM 113  Hip extension        Hip abduction    9 initially (17 after stretching and STM) 33 30  Hip adduction        Hip internal rotation    29 31 31   Hip external  rotation    24 26 24   Knee flexion        Knee extension        Ankle dorsiflexion        Ankle plantarflexion        Ankle inversion        Ankle eversion         (Blank rows = not tested)  LOWER EXTREMITY MMT:  MMT Right 11/24 Left 11/24   Hip flexion 4+ 4-   Hip extension     Hip abduction 4+ 4-   Hip adduction     Hip internal rotation 4+ 3+!   Hip external rotation 4+ 4-   Knee flexion     Knee extension     Ankle dorsiflexion     Ankle plantarflexion     Ankle inversion     Ankle eversion      (Blank rows = not tested)   GAIT: Distance walked: 33ft  Assistive device utilized: Walker - 2 wheeled Level of assistance: Complete Independence Comments: Slow, antalgic gait with PWB. (30% WB per pt).  TREATMENT DATE:  05/09/24 Nustep lvl 4 x 5 mins bilat UE/Les Bridges 2x10 Standing HS curl with 1# Standing hip extension #1 STM to hip flexor, quad Bridges  SLR  Side stepping at counter height    04/27/24 PROM L hip STM to L gluteal mm Supine bridge 2x10 Hooklying marching 2# 2x10 Hooklying adductor squeeze 5 x20 Windshield wiper stretch 5 x10ea LAQ  4# 2x10 Step ups 4 inch step 2x10 Standing hip abduction 2x10bil   04/25/24 Pt received L hip PROM in flexion, Abd, ER, and IR in supine per pt and tissue tolerance. Supine bridge 2x12 LAQ 3# 2x15, 4# x10 Step ups 4 inch step 2x10 Step downs 4 inch step Mini squats with UE support on rail 2x10 reps Lateral band walks with YTB above knees x 3 laps Stool rotations  2x10  04/20/24 Pt received L hip PROM in flexion, Abd, ER, and IR in supine. Nustep lvl 4 x 5 mins bilat UE/Les Bridges 2x10 Marching on airex with UE support on rail 2x15 Mini squats with UE support on rail 3x 10 reps Stool rotations 2x10 LAQ  3#   2x15   04/12/24 Pt received L hip PROM in flexion, Abd, ER,  and IR in supine. Nustep lvl 5 x 5 mins bilat UE/LEs Marching on airex with UE support on rail 2x15 Step ups with rail support x 10 on 4 inch step, x 5 on 6 inch step Lateral band walks with YTB above knees x 3 laps Mini squats with UE support on rail x 10 reps Stool rotations 2x10 LAQ  3#   2x15     PATIENT EDUCATION:  Education details: relevant anatomy, exercise form, diagnosis, post op limitations and restrictions, HEP,  and POC. Person educated: Patient Education method: Medical Illustrator, verbal cues Education comprehension: verbalized understanding, returned demonstration, verbal cues required, and tactile cues required  HOME EXERCISE PROGRAM: Access Code: [email or web address] URL: [email or web address] Date: 01/08/2024 Prepared by: Rojean Batten    ASSESSMENT:  CLINICAL IMPRESSION:  Pt remains tight and tender into L L quad and hip flexor. Utilized manual techniques to this area to improve this. Followed this by strengthening focused on hip mm. Pt states that she is making small improvements, and would like to get stronger to be able to walk without her SPC. 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 Pt will benefit from continued PT to continue progressing strength and working towards improved mobility.     OBJECTIVE IMPAIRMENTS: decreased activity tolerance, difficulty walking, decreased balance, decreased endurance, decreased mobility, decreased ROM, decreased strength, impaired flexibility, impaired UE/LE use, postural dysfunction, and pain.  ACTIVITY LIMITATIONS: bending, lifting, carry, locomotion, cleaning, community activity, driving, and or occupation  PERSONAL FACTORS: Age, second surgical procedure to same location, past medical history are also affecting patient's functional outcome.  REHAB POTENTIAL: Good  CLINICAL DECISION MAKING:  Stable/uncomplicated  EVALUATION COMPLEXITY: Low    SHORT TERM GOALS: 01/28/24  Pt will be independent and compliant with HEP for improved pain, strength, and function. Baseline: Goal status: MET 9/3 Target date:      2.  Pt will demo improved quality of gait with increased stance time and toe off on L.   Baseline:  Goal status: IN PROGRESS 9/3 Target date:     3.  Pt will progress with exercises per protocol without adverse effects for improved strength and mobility.  Baseline:  Goal status: IN PROGRESS 11/24 Target date:       LONG TERM GOALS: Target date: POC date      Pt will be able to ambulate extended community distance without significant difficulty and pain.   Baseline:  Goal status: MET 04/10/24    ~1 mile  2.  Pt will be able to perform her ADLs and IADLs including household chores without significant difficulty and pain. Baseline:  Goal status: IN PROGRESS 12/23   3.  Pt will be able to take care of her husband without significant limitations.   Baseline:  Goal status: Met 03/13/24   4.  Pt will be able to perform mini squats with good form and without increased pain in order for improved functional LE strength and to assist with returning to gardening activities.   Baseline:  Goal status: MET 11/24  Pt will report improved tolerance with standing activities and ambulation.  Baseline:  Goal status: IN PROGRESS 12/23 Target date:       5.  Pt will ambulate with a normalized heel to toe gait without limping.  Baseline:  Goal status: IN PROGRESS 11/24 Target date:      6.  Pt will be able to perform a 6 inch step up with good form and control with L LE leading for improved performance of stairs abd and improved functional strength. Baseline:  Goal status: Ongoing 04/10/24, pt has pain with 6, but can handle 4  Target date:       PLAN:   PT FREQUENCY:  2x/wk    PT DURATION: 6 weeks    PLANNED INTERVENTIONS (unless contraindicated):  aquatic PT, Canalith repositioning, cryotherapy, Electrical stimulation, Iontophoresis with 4 mg/ml dexamethasome, Moist heat, traction, Ultrasound, gait training, Therapeutic exercise, balance training, neuromuscular re-education, patient/family education, prosthetic training, manual techniques, passive ROM, dry needling, taping, vasopnuematic device, vestibular, spinal manipulations, joint manipulations  PLAN FOR NEXT SESSION:  Cont per Dr. Danetta Patch med repair protocol.   Rojean Batten PT, DPT 05/09/2024  11:11 AM             "

## 2024-05-12 ENCOUNTER — Ambulatory Visit (HOSPITAL_BASED_OUTPATIENT_CLINIC_OR_DEPARTMENT_OTHER): Payer: Self-pay | Admitting: Physical Therapy

## 2024-06-07 ENCOUNTER — Ambulatory Visit: Admitting: Internal Medicine

## 2024-06-07 ENCOUNTER — Encounter: Payer: Self-pay | Admitting: Internal Medicine

## 2024-06-07 VITALS — BP 142/80 | HR 76 | Ht 61.0 in | Wt 109.0 lb

## 2024-06-07 DIAGNOSIS — Z8719 Personal history of other diseases of the digestive system: Secondary | ICD-10-CM

## 2024-06-07 DIAGNOSIS — R1013 Epigastric pain: Secondary | ICD-10-CM

## 2024-06-07 DIAGNOSIS — K5730 Diverticulosis of large intestine without perforation or abscess without bleeding: Secondary | ICD-10-CM | POA: Diagnosis not present

## 2024-06-07 DIAGNOSIS — K5710 Diverticulosis of small intestine without perforation or abscess without bleeding: Secondary | ICD-10-CM

## 2024-06-07 DIAGNOSIS — K5712 Diverticulitis of small intestine without perforation or abscess without bleeding: Secondary | ICD-10-CM

## 2024-06-07 DIAGNOSIS — R634 Abnormal weight loss: Secondary | ICD-10-CM

## 2024-06-07 DIAGNOSIS — R140 Abdominal distension (gaseous): Secondary | ICD-10-CM

## 2024-06-07 DIAGNOSIS — K219 Gastro-esophageal reflux disease without esophagitis: Secondary | ICD-10-CM

## 2024-06-07 DIAGNOSIS — K2970 Gastritis, unspecified, without bleeding: Secondary | ICD-10-CM

## 2024-06-07 MED ORDER — PANTOPRAZOLE SODIUM 20 MG PO TBEC: 20.0000 mg | DELAYED_RELEASE_TABLET | Freq: Two times a day (BID) | ORAL | Status: AC

## 2024-06-07 MED ORDER — DOXYCYCLINE HYCLATE 100 MG PO TABS: 100.0000 mg | ORAL_TABLET | Freq: Two times a day (BID) | ORAL | 0 refills | Status: AC

## 2024-06-07 NOTE — Patient Instructions (Signed)
" °  You have been scheduled for a CT scan of the abdomen and pelvis at [facility]. The address can be found later in this AVS under upcoming appointment. You are scheduled on Wednesday, February 4th  at 1:30pm. You should arrive at 11:30am for registration and to drink oral contrast.    We have sent the following medications to your pharmacy for you to pick up at your convenience: Doxycycline  100 mg : Take twice a day for 10 days  Change pantoprazole  to 20 mg twice daily  Try Ensure Clear or Protein bars  You have been scheduled for a follow up appointment on Tuesday, 07-25-24 at 8:30am. Please arrive 10 minutes early for registration. If you need to reschedule or cancel this appointment please call [phone] as soon as possible. Thank you.   If you have any questions regarding your exam or if you need to reschedule, you may call [facility] Scheduling at [phone] between the hours of 8:00 am and 5:00 pm, Monday-Friday.     Thank you for entrusting me with your care and for choosing [facility], Dr. Estefana Kidney   _______________________________________________________  If your blood pressure at your visit was 140/90 or greater, please contact your primary care physician to follow up on this.  _______________________________________________________  If you are age 63 or older, your body mass index should be between 23-30. Your Body mass index is 20.6 kg/m. If this is out of the aforementioned range listed, please consider follow up with your Primary Care Provider.  If you are age 89 or younger, your body mass index should be between 19-25. Your Body mass index is 20.6 kg/m. If this is out of the aformentioned range listed, please consider follow up with your Primary Care Provider.   ________________________________________________________  The [facility] providers would like to encourage you to use MYCHART to communicate with  providers for non-urgent requests or questions.  Due to long hold times on the telephone, sending your provider a message by [facility] may be a faster and more efficient way to get a response.  Please allow 48 business hours for a response.  Please remember that this is for non-urgent requests.  _______________________________________________________  [facility] is using a team-based approach to care.  Your team is made up of your doctor and two to three APPS. Our APPS (Nurse Practitioners and Physician Assistants) work with your physician to ensure care continuity for you. They are fully qualified to address your health concerns and develop a treatment plan. They communicate directly with your gastroenterologist to care for you. Seeing the Advanced Practice Practitioners on your physician's team can help you by facilitating care more promptly, often allowing for earlier appointments, access to diagnostic testing, procedures, and other specialty referrals.   Due to recent changes in healthcare laws, you may see the results of your imaging and laboratory studies on MyChart before your provider has had a chance to review them.  We understand that in some cases there may be results that are confusing or concerning to you. Not all laboratory results come back in the same time frame and the provider may be waiting for multiple results in order to interpret others.  Please give us  48 hours in order for your provider to thoroughly review all the results before contacting the office for clarification of your results.   "

## 2024-06-07 NOTE — Progress Notes (Signed)
 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  She actually had CT proven jejunal diverticulitis in 2016 involving a 3.5 cm diverticulum.  Imaging also showed colonic diverticulosis.  Interval History: Endorses pain in the mid abdomen that started a few months ago. Endorses gas and bloating. She has normal bowel habits with one BM per day. She can take pantoprazole  20 mg and then within a couple of hours, she is feeling really well. She feels poorly if she takes the 40 mg dosage of pantoprazole , which is why she only takes the 20 mg dose currently. She hears a lot of grumbling in her abdomen. Endorses burning in the chest. Denies regurgitation. Denies N&V. Denies melena. Denies constipation. Has occasional diarrhea. Endorses use of Aleve one pill per day. Since I last saw her in clinic, she had to have another gluteal tear repair surgery. She is currently still undergoing physical therapy to recover from her surgery. She previously wasn't able to tolerate Ensure or Boost due to concerns about dairy consumption.  Wt Readings from Last 3 Encounters:  06/07/24 109 lb (49.4 kg)  01/04/24 111 lb 1.8 oz (50.4 kg)  11/04/23 113 lb 9.6 oz (51.5 kg)   Past Medical History:  Diagnosis Date   Allergy    Anxiety    Arthritis    Bursitis    left thigh   GERD (gastroesophageal reflux disease)    Heart murmur    History of kidney stones    Hypertension    Osteoarthritis    R hand   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   Left Buccal Cheek (Keratoacanthoma)   Umbilical hernia    Past Surgical History:  Procedure Laterality Date   BOWEL RESECTION  07/09/2013   Procedure: SMALL BOWEL RESECTION;  Surgeon: Krystal CHRISTELLA Spinner, MD;  Location: [facility];  Service:  General;;   RODDIE NEVIN REPAIR Left 03/22/2023   Procedure: LEFT GLUTEUS MEDIUS REPAIR WITH POSSIBLE COLLAGEN PATCH AUGMENTATION;  Surgeon: Genelle Standing, MD;  Location: [facility];  Service: Orthopedics;  Laterality: Left;   GLUTEUS MINIMUS REPAIR Left 01/04/2024   Procedure: REPAIR, TENDON, GLUTEUS MINIMUS;  Surgeon: Genelle Standing, MD;  Location: [facility];  Service: Orthopedics;  Laterality: Left;  LEFT HIP GLUTEUS MAXIMUS TENDON TRANSFER WITH COLLAGEN PATCH AUGMENTATION   HIP ARTHROPLASTY Left 2025   JOINT REPLACEMENT  2011   shoulder replacement   LAPAROTOMY N/A 07/09/2013   Procedure: EXPLORATORY LAPAROTOMY;  Surgeon: Krystal CHRISTELLA Spinner, MD;  Location: [facility];  Service: General;  Laterality: N/A;   SHOULDER ARTHROSCOPY WITH CAPSULORRHAPHY     SMALL INTESTINE SURGERY  2017   TUBAL LIGATION      reports that she has quit smoking. Her smoking use included cigarettes. She has never used smokeless tobacco. She reports current alcohol use of about 7.0 standard drinks of alcohol per week. She reports that she does not use drugs. family history includes Alcohol abuse in her father; Anxiety disorder in her mother; Arthritis in her father and mother; CAD in her father; Diabetes in her mother; Hearing loss in her father and mother; Heart attack in her father and mother; Hypertension in her father. Allergies  Allergen Reactions   Lactose Intolerance (Gi) Diarrhea   Sulfa Antibiotics Nausea And Vomiting and Swelling  Outpatient Encounter Medications as of 06/07/2024  Medication Sig   Calcium  Carbonate Antacid (TUMS PO) Take 1 tablet by mouth as needed (reflux).   Carboxymethylcellulose Sodium (EYE DROPS OP) Place 2 drops into both eyes daily. Thera Tears   chlorhexidine (PERIDEX) 0.12 % solution 15 mLs 2 (two) times daily.   furosemide  (LASIX ) 40 MG tablet Take 1 tablet (40 mg total) by mouth daily.   hydrochlorothiazide  (MICROZIDE ) 12.5 MG capsule TAKE 1  CAPSULE(12.5 MG) BY MOUTH DAILY   losartan  (COZAAR ) 50 MG tablet TAKE 1 TABLET(50 MG) BY MOUTH DAILY   naproxen sodium (ALEVE) 220 MG tablet Take 220 mg by mouth.   pantoprazole  (PROTONIX ) 40 MG tablet Take 1 tablet (40 mg total) by mouth daily.   [DISCONTINUED] aspirin  EC 325 MG tablet Take 1 tablet (325 mg total) by mouth daily.   [DISCONTINUED] gabapentin  (NEURONTIN ) 100 MG capsule Take 1 capsule (100 mg total) by mouth at bedtime.   [DISCONTINUED] meloxicam  (MOBIC ) 15 MG tablet Take 1 tablet (15 mg total) by mouth daily.   [DISCONTINUED] methylPREDNISolone  (MEDROL  DOSEPAK) 4 MG TBPK tablet Take per packet instructions   No facility-administered encounter medications on file as of 06/07/2024.   PHYSICAL EXAM: BP (!) 142/80   Pulse 76   Ht 5' 1 (1.549 m)   Wt 109 lb (49.4 kg)   BMI 20.60 kg/m  General: Well developed white female in no acute distress Head: Normocephalic and atraumatic Lungs: No increased WOB Heart: Regular rate Abdomen: Soft. Scars from surgery noted along with hernia in mid-abdomen as well.  Non-tender.  BS present. Neurological: Alert oriented x 4, grossly non-focal Psychological:  Alert and cooperative. Normal mood and affect  Labs 12/2022: CBC unremarkable. BMP unremarkable. CMP with low albumin of 2.8 and normal LFTs. Lipase nml.   CT A/P w/contrast 12/24/22: IMPRESSION: Mildly dilated fluid-filled loops of small bowel diffusely with transition at the level of the distal ileum where there is some fold thickening and stranding. Please correlate for etiology including differential of infectious, inflammatory and ischemic process. Trace free fluid and mesenteric stranding. No free air. Colonic diverticulosis. Mild dilatation of the biliary tree but normal tapering of the common duct towards the pancreatic head. 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  EGD 10/07/22: - Normal esophagus.  - Four gastric polyps. Resected and retrieved.   - Gastritis. Biopsied.  - Normal examined duodenum. Path: 1. Surgical [P], gastric antrum and gastric body - ANTRAL MUCOSA WITH CHEMICAL/REACTIVE GASTROPATHY. - OXYNTIC MUCOSA WITH MILD CHEMICAL/REACTIVE CHANGE. - NO HELICOBACTER PYLORI ORGANISMS IDENTIFIED ON H&E STAINED SLIDE. 2. Surgical [P], gastric polyps-4 - POLYPOID FRAGMENTS OF PREDOMINANTLY OXYNTIC TYPE MUCOSA WITH OVERLAPPING FEATURES OF BOTH A FUNDIC GLAND POLYP AND HYPERPLASTIC POLYP WITH FOCAL EROSION.  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 Will have her try BID to see if this helps further work her symptoms. She does still take daily Aleve, which could be contributing to some underlying gastritis/PUD. She also endorses significant bloating and previously responded to doxycycline  therapy for SIBO so will do another course of therapy to see if this helps with her symptoms. Will plan for a CT scan to rule out diverticulitis since  she has history of small bowel diverticulitis. For her weight loss, I encouraged her to try to take nutritional supplements to maintain her current weight. - Increase prescription to pantoprazole  20 mg BID - Doxycyline 100 mg BID for 10 days - Try to avoid NSAID use - Schedule CT scan A/P w/contrast for a few weeks from now - Take Ensure clear BID or protein bars to help with weight loss - RTC 2 months   Estefana Kidney, MD  I spent 36 minutes of time, including in depth chart review, independent review of results as outlined above, communicating results with the patient directly, face-to-face time with the patient,  coordinating care, ordering studies and medications as appropriate, and documentation.  "

## 2024-06-16 ENCOUNTER — Other Ambulatory Visit: Payer: Self-pay | Admitting: Family Medicine

## 2024-06-20 ENCOUNTER — Ambulatory Visit (HOSPITAL_BASED_OUTPATIENT_CLINIC_OR_DEPARTMENT_OTHER): Attending: Orthopaedic Surgery | Admitting: Physical Therapy

## 2024-06-20 ENCOUNTER — Encounter (HOSPITAL_BASED_OUTPATIENT_CLINIC_OR_DEPARTMENT_OTHER): Payer: Self-pay | Admitting: Physical Therapy

## 2024-06-20 DIAGNOSIS — M25652 Stiffness of left hip, not elsewhere classified: Secondary | ICD-10-CM

## 2024-06-20 DIAGNOSIS — M6281 Muscle weakness (generalized): Secondary | ICD-10-CM

## 2024-06-20 DIAGNOSIS — R262 Difficulty in walking, not elsewhere classified: Secondary | ICD-10-CM

## 2024-06-20 DIAGNOSIS — M25552 Pain in left hip: Secondary | ICD-10-CM

## 2024-06-21 ENCOUNTER — Ambulatory Visit (HOSPITAL_BASED_OUTPATIENT_CLINIC_OR_DEPARTMENT_OTHER)

## 2024-06-22 ENCOUNTER — Ambulatory Visit (HOSPITAL_BASED_OUTPATIENT_CLINIC_OR_DEPARTMENT_OTHER)

## 2024-06-22 ENCOUNTER — Encounter (HOSPITAL_BASED_OUTPATIENT_CLINIC_OR_DEPARTMENT_OTHER): Payer: Self-pay

## 2024-06-22 DIAGNOSIS — M25652 Stiffness of left hip, not elsewhere classified: Secondary | ICD-10-CM

## 2024-06-22 DIAGNOSIS — M6281 Muscle weakness (generalized): Secondary | ICD-10-CM

## 2024-06-22 DIAGNOSIS — M25552 Pain in left hip: Secondary | ICD-10-CM

## 2024-06-22 DIAGNOSIS — R262 Difficulty in walking, not elsewhere classified: Secondary | ICD-10-CM

## 2024-06-22 NOTE — Therapy (Addendum)
 " OUTPATIENT PHYSICAL THERAPY LOWER EXTREMITY TREATMENT          Patient Name: Ellen [name] MRN: [id number] DOB:28-Sep-1940, 84 y.o., female Today's Date: 06/22/2024  END OF SESSION:  PT End of Session - 06/22/24 1352     Visit Number 27    Number of Visits 34    Date for Recertification  07/25/24    Authorization Type MCR A & B    Progress Note Due on Visit 30    PT Start Time 1347    PT Stop Time 1430    PT Time Calculation (min) 43 min    Activity Tolerance Patient tolerated treatment well    Behavior During Therapy WFL for tasks assessed/performed                                 Past Medical History:  Diagnosis Date   Allergy    Anxiety    Arthritis    Bursitis    left thigh   GERD (gastroesophageal reflux disease)    Heart murmur    History of kidney stones    Hypertension    Osteoarthritis    R hand   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   Left Buccal Cheek (Keratoacanthoma)   Umbilical hernia    Past Surgical History:  Procedure Laterality Date   BOWEL RESECTION  07/09/2013   Procedure: SMALL BOWEL RESECTION;  Surgeon: Krystal CHRISTELLA Spinner, MD;  Location: [facility];  Service: General;;   RODDIE NEVIN REPAIR Left 03/22/2023   Procedure: LEFT GLUTEUS MEDIUS REPAIR WITH POSSIBLE COLLAGEN PATCH AUGMENTATION;  Surgeon: Genelle Standing, MD;  Location: [facility];  Service: Orthopedics;  Laterality: Left;   GLUTEUS MINIMUS REPAIR Left 01/04/2024   Procedure: REPAIR, TENDON, GLUTEUS MINIMUS;  Surgeon: Genelle Standing, MD;  Location: [facility];  Service: Orthopedics;  Laterality: Left;  LEFT HIP GLUTEUS MAXIMUS TENDON TRANSFER WITH COLLAGEN PATCH AUGMENTATION   HIP ARTHROPLASTY Left 2025   JOINT REPLACEMENT  2011   shoulder replacement   LAPAROTOMY N/A 07/09/2013   Procedure: EXPLORATORY LAPAROTOMY;  Surgeon: Krystal CHRISTELLA Spinner, MD;  Location: [facility];  Service: General;  Laterality: N/A;   SHOULDER  ARTHROSCOPY WITH CAPSULORRHAPHY     SMALL INTESTINE SURGERY  2017   TUBAL LIGATION     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   Hypokalemia 12/27/2022   Volume depletion 12/24/2022   Constipation 12/24/2022   Partial small bowel obstruction (HCC) 12/24/2022   Polycythemia 12/24/2022   Gastroesophageal reflux disease 08/26/2022   Bloating 08/26/2022   Small intestinal bacterial overgrowth (SIBO) 08/26/2022   Osteoarthritis 12/01/2021   Plantar wart 12/01/2021   Ventral hernia 01/01/2021   Diverticulosis 01/05/2019   Dermatitis 01/05/2019   Systolic murmur 07/25/2018   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   Hyperglycemia 10/05/2017   Leg edema 10/05/2017   Hammer toe 10/05/2017   Hot flashes due to menopause 10/05/2017   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/10/2013 07/11/2013   Hypertension 07/09/2013    PCP: [place] CHRISTELLA, MD  REFERRING PROVIDER: Genelle Standing, MD  REFERRING DIAG: Tendinopathy of gluteus medius [F32.040]   THERAPY DIAG:  Pain in left hip  Muscle weakness (generalized)  Stiffness of left hip, not elsewhere  classified  Difficulty in walking, not elsewhere classified  Rationale for Evaluation and Treatment: Rehabilitation  ONSET DATE: 01/04/2024  SUBJECTIVE:   SUBJECTIVE STATEMENT:  Pt reports ongoing hip pain without improvement. Still bothers her at night.   PN: Pt states she had to do a lot of shoveling to clear the snow out of her driveway.  Pt states she has been unable to attend PT due to the clinic's schedule and her husband's health issues.  Pt states her diverticulitis flared up and she saw a GI specialist.  MD ordered a MRI.      Pt states she has increased pain with bending over which may be shoveling.   Her pain comes and goes.  Her pain can be an 8-9/10 or  pretty much nothing depending on her activity level.  Pt thinks she is walking better and feels stronger.  Pt states the shockwave therapy didn't help.  Pt uses her can very seldom.  She used the cane for stability with a long walk into [facility].  PERTINENT HISTORY: L hip gluteus maximus tendon transfer and trochanteric bursectomy on 01/04/24 Glute min repair, Laparotomy, Shoulder arthroscopy with capsulorrhaphy.   PAIN:  Are you having pain? Yes: NPRS scale: 3/10   Pain location: lateral and posterolateral L hip Pain description: Constant pain Aggravating factors: Walking, standing, sitting, any hip flexion  Relieving factors: Ice.   PRECAUTIONS: Other: gute min, fall risk.  RED FLAGS: None   WEIGHT BEARING RESTRICTIONS: Yes WBAT  FALLS:  Has patient fallen in last 6 months? No  LIVING ENVIRONMENT: Lives with: lives with their spouse Lives in: House/apartment Stairs: Yes: Internal: 12 steps; on right going up and External: 4 steps; bilateral but cannot reach both Has following equipment at home: Single point cane, Walker - 2 wheeled, Environmental Consultant - 4 wheeled, and Grab bars  OCCUPATION: Human resources officer.   PLOF: Independent  PATIENT GOALS: Pt would like to walk straight.   NEXT MD VISIT: 01/19/2024  OBJECTIVE:  Note: Objective measures were completed at Evaluation unless otherwise noted.  DIAGNOSTIC FINDINGS: IMPRESSION: 1. Progressive fluid signal tracking between the gluteus minimus and within and along the gluteus medius muscle extending down to moderate gluteus medius tendinopathy and peritendinitis. Mild to moderate left gluteus minimus tendinopathy distally. 2. Trace left trochanteric bursitis. 3. Moderate degenerative hip arthropathy bilaterally. 4. Proximal hamstring tendinopathy and likely partial tearing similar to prior. 5. Substantial loss of intervertebral disc height at L4-5 and L5-S1 with type 2 degenerative endplate findings eccentric to the right  at L4-5. 6. Intramural uterine fibroids. 7. Sigmoid colon diverticulosis.  PATIENT SURVEYS:  25/80 LEFS: 11/24: 47/80  COGNITION: Overall cognitive status: Within functional limits for tasks assessed     SENSATION: WFL  EDEMA:  Localized swelling from surgery.   POSTURE: No Significant postural limitations  PALPATION: Tenderness to palpation at surgical site.   LOWER EXTREMITY ROM:  Passive ROM Right eval Left eval 02/26/24  04/10/24 Left 04/10/24 Right 05/09/24 R  Hip flexion   92 115 AROM 112 AROM 113  Hip extension        Hip abduction    9 initially (17 after stretching and STM) 33 30  Hip adduction        Hip internal rotation    29 31 31   Hip external rotation    24 26 24   Knee flexion        Knee extension        Ankle dorsiflexion  Ankle plantarflexion        Ankle inversion        Ankle eversion         (Blank rows = not tested)  LOWER EXTREMITY MMT:  MMT Right 11/24 Left 11/24   Hip flexion 4+ 4-   Hip extension     Hip abduction 4+ 4-   Hip adduction     Hip internal rotation 4+ 3+!   Hip external rotation 4+ 4-   Knee flexion     Knee extension     Ankle dorsiflexion     Ankle plantarflexion     Ankle inversion     Ankle eversion      (Blank rows = not tested)   GAIT: Distance walked: 67ft  Assistive device utilized: Walker - 2 wheeled Level of assistance: Complete Independence Comments: Slow, antalgic gait with PWB. (30% WB per pt).                                                                                                                                 TREATMENT DATE:   06/22/24: PROM L hip LAD STM to hip flexors, gluteal mm S/l clams 3x10 Bridge with adductor squeeze 2x10 Supine HSS with strap 3x30sec Supine marching 2x10 LAQ with ball sqz 5 3x10 4# Standing quad stretch with strap 15sec hold x7    06/20/24 Reviewed pt presentation, HEP compliance, and pain level.   Nustep lvl 4 x 5 mins bilat UE/LEs Pt  received L hip PROM in flexion, abd, ER, and IR in supine.  Supine bridge 2x10  LAQ  4#  2x10 Lateral band walks with YTB above knees x 3 laps at rail Stool rotations  2x10 Step ups on airex pad 2x10 with UE support    05/09/24 Nustep lvl 4 x 5 mins bilat UE/Les Bridges 2x10 Standing HS curl with 1# Standing hip extension #1 STM to hip flexor, quad Bridges  SLR  Side stepping at counter height    04/27/24 PROM L hip STM to L gluteal mm Supine bridge 2x10 Hooklying marching 2# 2x10 Hooklying adductor squeeze 5 x20 Windshield wiper stretch 5 x10ea LAQ  4# 2x10 Step ups 4 inch step 2x10 Standing hip abduction 2x10bil   04/25/24 Pt received L hip PROM in flexion, Abd, ER, and IR in supine per pt and tissue tolerance. Supine bridge 2x12 LAQ 3# 2x15, 4# x10 Step ups 4 inch step 2x10 Step downs 4 inch step Mini squats with UE support on rail 2x10 reps Lateral band walks with YTB above knees x 3 laps Stool rotations  2x10  04/20/24 Pt received L hip PROM in flexion, Abd, ER, and IR in supine. Nustep lvl 4 x 5 mins bilat UE/Les Bridges 2x10 Marching on airex with UE support on rail 2x15 Mini squats with UE support on rail 3x 10 reps Stool rotations 2x10 LAQ  3#   2x15  PATIENT EDUCATION:  Education details: relevant anatomy, exercise form, diagnosis, post op limitations and restrictions, HEP,  and POC. Person educated: Patient Education method: Medical Illustrator, verbal cues Education comprehension: verbalized understanding, returned demonstration, verbal cues required, and tactile cues required  HOME EXERCISE PROGRAM: Access Code: [email or web address] URL: [email or web address] Date: 01/08/2024 Prepared by: Rojean Batten    ASSESSMENT:  CLINICAL IMPRESSION:  Pt continues to exhibit palpable restrictions in lateral proximal L quad and hip flexor area. Spent time on this in clinic as well as on STM to posterior gluteal mm. Worked on  gentle strengthening and NMR interventions to improve mm activation and strength. Will monitor pain level as we progress.   PN: Pt hasn't been seen in PT since 05/12/24 due to the clinic's schedule availability and her husband's health issues.  A progress note was completed on her last visit though pt hasn't been seen since that visit.  She saw Dr. Burnetta on 05/08/24 and note indicated to continue PT.  She continues to have hip pain stating her pain comes and goes.  She states her pain can be an 8-9/10 or pretty much nothing depending on her activity level.  Pt thinks she is walking better and feels stronger.  Pt is ambulating without her cane and states she uses it very seldom.  Pt was having pain with step ups prior.  Pt didn't perform step ups on a step today though performed on an airex pad which is lower.  She tolerated exercises and hip PROM well.  She responded well to treatment stating she felt no worse after treatment than when she came in.  Pt to continue with PT to address ongoing goals and to improve strength, gait, functional mobility, ROM, and tolerance to activity.     OBJECTIVE IMPAIRMENTS: decreased activity tolerance, difficulty walking, decreased balance, decreased endurance, decreased mobility, decreased ROM, decreased strength, impaired flexibility, impaired UE/LE use, postural dysfunction, and pain.  ACTIVITY LIMITATIONS: bending, lifting, carry, locomotion, cleaning, community activity, driving, and or occupation  PERSONAL FACTORS: Age, second surgical procedure to same location, past medical history are also affecting patient's functional outcome.  REHAB POTENTIAL: Good  CLINICAL DECISION MAKING: Stable/uncomplicated  EVALUATION COMPLEXITY: Low    SHORT TERM GOALS: 01/28/24  Pt will be independent and compliant with HEP for improved pain, strength, and function. Baseline: Goal status: MET 9/3 Target date:      2.  Pt will demo improved quality of gait with  increased stance time and toe off on L.   Baseline:  Goal status: IN PROGRESS 9/3 Target date:     3.  Pt will progress with exercises per protocol without adverse effects for improved strength and mobility.   Baseline:  Goal status: IN PROGRESS 11/24 Target date:       LONG TERM GOALS: Target date: 07/25/2024      Pt will be able to ambulate extended community distance without significant difficulty and pain.   Baseline:  Goal status: MET 04/10/24    ~1 mile  2.  Pt will be able to perform her ADLs and IADLs including household chores without significant difficulty and pain. Baseline:  Goal status: IN PROGRESS 12/23   3.  Pt will be able to take care of her husband without significant limitations.   Baseline:  Goal status: Met 03/13/24   4.  Pt will be able to perform mini squats with good form and without increased pain in order for improved functional LE strength and to assist with returning  to gardening activities.   Baseline:  Goal status: MET 11/24  Pt will report improved tolerance with standing activities and ambulation.  Baseline:  Goal status: IN PROGRESS 12/23 Target date:       5.  Pt will ambulate with a normalized heel to toe gait without limping.  Baseline:  Goal status: IN PROGRESS 11/24 Target date:      6.  Pt will be able to perform a 6 inch step up with good form and control with L LE leading for improved performance of stairs abd and improved functional strength. Baseline:  Goal status: Ongoing 04/10/24, pt has pain with 6, but can handle 4  Target date:       PLAN:   PT FREQUENCY:  2x/wk    PT DURATION: 4-5 weeks    PLANNED INTERVENTIONS (unless contraindicated): aquatic PT, Canalith repositioning, cryotherapy, Electrical stimulation, Iontophoresis with 4 mg/ml dexamethasome, Moist heat, traction, Ultrasound, gait training, Therapeutic exercise, balance training, neuromuscular re-education, patient/family education, prosthetic training,  manual techniques, passive ROM, dry needling, taping, vasopnuematic device, vestibular, spinal manipulations, joint manipulations  PLAN FOR NEXT SESSION:  Cont per Dr. Danetta Patch med repair protocol.   Leigh Minerva III PT, DPT 06/22/24 4:28 PM              "

## 2024-06-26 ENCOUNTER — Ambulatory Visit (HOSPITAL_BASED_OUTPATIENT_CLINIC_OR_DEPARTMENT_OTHER)

## 2024-06-27 ENCOUNTER — Ambulatory Visit (HOSPITAL_BASED_OUTPATIENT_CLINIC_OR_DEPARTMENT_OTHER)

## 2024-06-29 ENCOUNTER — Encounter (HOSPITAL_BASED_OUTPATIENT_CLINIC_OR_DEPARTMENT_OTHER): Admitting: Physical Therapy

## 2024-07-04 ENCOUNTER — Encounter (HOSPITAL_BASED_OUTPATIENT_CLINIC_OR_DEPARTMENT_OTHER): Admitting: Physical Therapy

## 2024-07-06 ENCOUNTER — Encounter (HOSPITAL_BASED_OUTPATIENT_CLINIC_OR_DEPARTMENT_OTHER)

## 2024-07-11 ENCOUNTER — Encounter (HOSPITAL_BASED_OUTPATIENT_CLINIC_OR_DEPARTMENT_OTHER)

## 2024-07-13 ENCOUNTER — Encounter (HOSPITAL_BASED_OUTPATIENT_CLINIC_OR_DEPARTMENT_OTHER)

## 2024-07-18 ENCOUNTER — Encounter (HOSPITAL_BASED_OUTPATIENT_CLINIC_OR_DEPARTMENT_OTHER): Admitting: Physical Therapy

## 2024-07-20 ENCOUNTER — Encounter (HOSPITAL_BASED_OUTPATIENT_CLINIC_OR_DEPARTMENT_OTHER): Admitting: Physical Therapy

## 2024-07-25 ENCOUNTER — Ambulatory Visit: Admitting: Internal Medicine

## 2024-07-26 ENCOUNTER — Ambulatory Visit (HOSPITAL_BASED_OUTPATIENT_CLINIC_OR_DEPARTMENT_OTHER): Admitting: Orthopaedic Surgery

## 2024-07-26 ENCOUNTER — Ambulatory Visit
